# Patient Record
Sex: Female | Born: 1950 | Race: Black or African American | Hispanic: No | Smoking: Former smoker
Health system: Southern US, Community
[De-identification: ages and names within clinical notes are randomized; demographics above are authoritative.]

## PROBLEM LIST (undated history)

## (undated) DIAGNOSIS — E785 Hyperlipidemia, unspecified: Secondary | ICD-10-CM

## (undated) DIAGNOSIS — I2510 Atherosclerotic heart disease of native coronary artery without angina pectoris: Secondary | ICD-10-CM

## (undated) DIAGNOSIS — Z9289 Personal history of other medical treatment: Secondary | ICD-10-CM

## (undated) DIAGNOSIS — I739 Peripheral vascular disease, unspecified: Secondary | ICD-10-CM

## (undated) DIAGNOSIS — R112 Nausea with vomiting, unspecified: Secondary | ICD-10-CM

## (undated) DIAGNOSIS — D649 Anemia, unspecified: Secondary | ICD-10-CM

## (undated) DIAGNOSIS — Z9889 Other specified postprocedural states: Secondary | ICD-10-CM

## (undated) DIAGNOSIS — N189 Chronic kidney disease, unspecified: Secondary | ICD-10-CM

## (undated) DIAGNOSIS — R0600 Dyspnea, unspecified: Secondary | ICD-10-CM

## (undated) DIAGNOSIS — T8859XA Other complications of anesthesia, initial encounter: Secondary | ICD-10-CM

## (undated) DIAGNOSIS — N186 End stage renal disease: Secondary | ICD-10-CM

## (undated) DIAGNOSIS — I10 Essential (primary) hypertension: Secondary | ICD-10-CM

## (undated) DIAGNOSIS — K5900 Constipation, unspecified: Secondary | ICD-10-CM

## (undated) DIAGNOSIS — I219 Acute myocardial infarction, unspecified: Secondary | ICD-10-CM

## (undated) DIAGNOSIS — T4145XA Adverse effect of unspecified anesthetic, initial encounter: Secondary | ICD-10-CM

## (undated) DIAGNOSIS — I509 Heart failure, unspecified: Secondary | ICD-10-CM

## (undated) DIAGNOSIS — I255 Ischemic cardiomyopathy: Secondary | ICD-10-CM

## (undated) HISTORY — PX: EYE SURGERY: SHX253

## (undated) HISTORY — DX: Chronic kidney disease, unspecified: N18.9

## (undated) HISTORY — DX: Peripheral vascular disease, unspecified: I73.9

## (undated) HISTORY — PX: APPENDECTOMY: SHX54

## (undated) HISTORY — PX: CARDIAC CATHETERIZATION: SHX172

## (undated) HISTORY — PX: TONSILLECTOMY: SUR1361

## (undated) HISTORY — PX: TUBAL LIGATION: SHX77

---

## 1999-02-21 ENCOUNTER — Emergency Department (HOSPITAL_COMMUNITY): Admission: EM | Admit: 1999-02-21 | Discharge: 1999-02-21 | Payer: Self-pay | Admitting: Emergency Medicine

## 2000-02-01 ENCOUNTER — Emergency Department (HOSPITAL_COMMUNITY): Admission: EM | Admit: 2000-02-01 | Discharge: 2000-02-02 | Payer: Self-pay | Admitting: Emergency Medicine

## 2000-04-13 ENCOUNTER — Encounter: Admission: RE | Admit: 2000-04-13 | Discharge: 2000-07-12 | Payer: Self-pay | Admitting: Family Medicine

## 2000-07-22 ENCOUNTER — Emergency Department (HOSPITAL_COMMUNITY): Admission: EM | Admit: 2000-07-22 | Discharge: 2000-07-22 | Payer: Self-pay

## 2001-06-10 ENCOUNTER — Emergency Department (HOSPITAL_COMMUNITY): Admission: EM | Admit: 2001-06-10 | Discharge: 2001-06-10 | Payer: Self-pay | Admitting: Emergency Medicine

## 2001-06-10 ENCOUNTER — Encounter: Payer: Self-pay | Admitting: Emergency Medicine

## 2002-06-20 ENCOUNTER — Encounter: Payer: Self-pay | Admitting: Family Medicine

## 2002-06-20 ENCOUNTER — Ambulatory Visit (HOSPITAL_COMMUNITY): Admission: RE | Admit: 2002-06-20 | Discharge: 2002-06-20 | Payer: Self-pay | Admitting: Family Medicine

## 2002-09-05 ENCOUNTER — Emergency Department (HOSPITAL_COMMUNITY): Admission: EM | Admit: 2002-09-05 | Discharge: 2002-09-05 | Payer: Self-pay | Admitting: Emergency Medicine

## 2002-09-05 ENCOUNTER — Encounter: Payer: Self-pay | Admitting: Emergency Medicine

## 2004-06-02 ENCOUNTER — Emergency Department (HOSPITAL_COMMUNITY): Admission: EM | Admit: 2004-06-02 | Discharge: 2004-06-03 | Payer: Self-pay | Admitting: Emergency Medicine

## 2004-06-27 ENCOUNTER — Ambulatory Visit: Payer: Self-pay | Admitting: Family Medicine

## 2004-08-07 ENCOUNTER — Ambulatory Visit: Payer: Self-pay | Admitting: Family Medicine

## 2004-09-10 ENCOUNTER — Ambulatory Visit: Payer: Self-pay | Admitting: Family Medicine

## 2004-09-25 ENCOUNTER — Ambulatory Visit (HOSPITAL_COMMUNITY): Admission: RE | Admit: 2004-09-25 | Discharge: 2004-09-25 | Payer: Self-pay | Admitting: Family Medicine

## 2004-10-08 ENCOUNTER — Ambulatory Visit: Payer: Self-pay | Admitting: Family Medicine

## 2004-10-09 ENCOUNTER — Ambulatory Visit: Payer: Self-pay | Admitting: *Deleted

## 2005-06-18 ENCOUNTER — Ambulatory Visit (HOSPITAL_COMMUNITY): Admission: RE | Admit: 2005-06-18 | Discharge: 2005-06-18 | Payer: Self-pay | Admitting: Family Medicine

## 2005-06-23 ENCOUNTER — Ambulatory Visit: Payer: Self-pay | Admitting: Family Medicine

## 2005-10-22 ENCOUNTER — Ambulatory Visit: Payer: Self-pay | Admitting: Family Medicine

## 2005-11-07 ENCOUNTER — Ambulatory Visit: Payer: Self-pay | Admitting: Family Medicine

## 2005-11-26 ENCOUNTER — Ambulatory Visit: Payer: Self-pay | Admitting: Family Medicine

## 2006-04-28 ENCOUNTER — Ambulatory Visit: Payer: Self-pay | Admitting: Family Medicine

## 2006-04-30 ENCOUNTER — Ambulatory Visit: Payer: Self-pay | Admitting: Family Medicine

## 2006-05-18 ENCOUNTER — Ambulatory Visit: Payer: Self-pay | Admitting: Family Medicine

## 2006-11-04 ENCOUNTER — Ambulatory Visit: Payer: Self-pay | Admitting: Family Medicine

## 2006-11-16 ENCOUNTER — Ambulatory Visit: Payer: Self-pay | Admitting: Family Medicine

## 2006-12-07 ENCOUNTER — Ambulatory Visit: Payer: Self-pay | Admitting: Family Medicine

## 2007-03-02 ENCOUNTER — Ambulatory Visit: Payer: Self-pay | Admitting: Family Medicine

## 2007-05-17 ENCOUNTER — Ambulatory Visit: Payer: Self-pay | Admitting: Family Medicine

## 2007-05-21 ENCOUNTER — Ambulatory Visit: Payer: Self-pay | Admitting: Family Medicine

## 2007-05-21 LAB — CONVERTED CEMR LAB
ALT: 12 units/L (ref 0–35)
AST: 11 units/L (ref 0–37)
Albumin: 3.9 g/dL (ref 3.5–5.2)
Alkaline Phosphatase: 66 units/L (ref 39–117)
BUN: 8 mg/dL (ref 6–23)
CO2: 25 meq/L (ref 19–32)
Calcium: 9.1 mg/dL (ref 8.4–10.5)
Chloride: 103 meq/L (ref 96–112)
Cholesterol: 149 mg/dL (ref 0–200)
Creatinine, Ser: 0.58 mg/dL (ref 0.40–1.20)
Glucose, Bld: 122 mg/dL — ABNORMAL HIGH (ref 70–99)
HDL: 48 mg/dL (ref >39)
Hgb A1c MFr Bld: 11.4 % — ABNORMAL HIGH (ref 4.6–6.1)
LDL Cholesterol: 82 mg/dL (ref 0–99)
Potassium: 4.2 meq/L (ref 3.5–5.3)
Sodium: 142 meq/L (ref 135–145)
Total Bilirubin: 0.4 mg/dL (ref 0.3–1.2)
Total CHOL/HDL Ratio: 3.1
Total Protein: 6.9 g/dL (ref 6.0–8.3)
Triglycerides: 93 mg/dL (ref <150)
VLDL: 19 mg/dL (ref 0–40)

## 2007-06-03 DIAGNOSIS — I10 Essential (primary) hypertension: Secondary | ICD-10-CM | POA: Insufficient documentation

## 2007-06-03 DIAGNOSIS — E785 Hyperlipidemia, unspecified: Secondary | ICD-10-CM | POA: Insufficient documentation

## 2007-06-03 DIAGNOSIS — E109 Type 1 diabetes mellitus without complications: Secondary | ICD-10-CM | POA: Insufficient documentation

## 2007-07-12 ENCOUNTER — Telehealth (INDEPENDENT_AMBULATORY_CARE_PROVIDER_SITE_OTHER): Payer: Self-pay | Admitting: *Deleted

## 2007-07-14 ENCOUNTER — Encounter (INDEPENDENT_AMBULATORY_CARE_PROVIDER_SITE_OTHER): Payer: Self-pay | Admitting: *Deleted

## 2007-07-19 ENCOUNTER — Ambulatory Visit: Payer: Self-pay | Admitting: Family Medicine

## 2007-07-19 DIAGNOSIS — M6530 Trigger finger, unspecified finger: Secondary | ICD-10-CM | POA: Insufficient documentation

## 2007-07-19 DIAGNOSIS — E1140 Type 2 diabetes mellitus with diabetic neuropathy, unspecified: Secondary | ICD-10-CM | POA: Insufficient documentation

## 2007-07-19 LAB — CONVERTED CEMR LAB
Blood Glucose, Fingerstick: 204
Hgb A1c MFr Bld: 10 %

## 2007-09-28 ENCOUNTER — Ambulatory Visit: Payer: Self-pay | Admitting: Family Medicine

## 2007-09-28 LAB — CONVERTED CEMR LAB: Blood Glucose, Fingerstick: 273

## 2007-11-02 ENCOUNTER — Ambulatory Visit (HOSPITAL_COMMUNITY): Admission: RE | Admit: 2007-11-02 | Discharge: 2007-11-02 | Payer: Self-pay | Admitting: Family Medicine

## 2008-03-28 ENCOUNTER — Ambulatory Visit: Payer: Self-pay | Admitting: Family Medicine

## 2008-03-28 LAB — CONVERTED CEMR LAB
Blood Glucose, Fingerstick: 381
Hgb A1c MFr Bld: 14 %

## 2008-04-04 ENCOUNTER — Ambulatory Visit: Payer: Self-pay | Admitting: Family Medicine

## 2008-05-09 ENCOUNTER — Ambulatory Visit: Payer: Self-pay | Admitting: Family Medicine

## 2008-05-09 LAB — CONVERTED CEMR LAB: Blood Glucose, Fingerstick: 125

## 2008-05-10 ENCOUNTER — Encounter (INDEPENDENT_AMBULATORY_CARE_PROVIDER_SITE_OTHER): Payer: Self-pay | Admitting: Family Medicine

## 2008-05-11 ENCOUNTER — Encounter (INDEPENDENT_AMBULATORY_CARE_PROVIDER_SITE_OTHER): Payer: Self-pay | Admitting: Family Medicine

## 2008-05-12 ENCOUNTER — Telehealth (INDEPENDENT_AMBULATORY_CARE_PROVIDER_SITE_OTHER): Payer: Self-pay | Admitting: Family Medicine

## 2008-05-14 LAB — CONVERTED CEMR LAB
ALT: 13 units/L (ref 0–35)
AST: 11 units/L (ref 0–37)
Albumin: 4 g/dL (ref 3.5–5.2)
Alkaline Phosphatase: 54 units/L (ref 39–117)
BUN: 17 mg/dL (ref 6–23)
Basophils Absolute: 0 10*3/uL (ref 0.0–0.1)
Basophils Relative: 0 % (ref 0–1)
CO2: 25 meq/L (ref 19–32)
Calcium: 9.6 mg/dL (ref 8.4–10.5)
Chloride: 103 meq/L (ref 96–112)
Cholesterol: 195 mg/dL (ref 0–200)
Creatinine, Ser: 0.75 mg/dL (ref 0.40–1.20)
Eosinophils Absolute: 0.3 10*3/uL (ref 0.0–0.7)
Eosinophils Relative: 4 % (ref 0–5)
Glucose, Bld: 115 mg/dL — ABNORMAL HIGH (ref 70–99)
HCT: 38.5 % (ref 36.0–46.0)
HDL: 58 mg/dL (ref >39)
Hemoglobin: 12 g/dL (ref 12.0–15.0)
LDL Cholesterol: 117 mg/dL — ABNORMAL HIGH (ref 0–99)
Lymphocytes Relative: 51 % — ABNORMAL HIGH (ref 12–46)
Lymphs Abs: 3.9 10*3/uL (ref 0.7–4.0)
MCHC: 31.2 g/dL (ref 30.0–36.0)
MCV: 88.3 fL (ref 78.0–100.0)
Monocytes Absolute: 0.4 10*3/uL (ref 0.1–1.0)
Monocytes Relative: 5 % (ref 3–12)
Neutro Abs: 3.1 10*3/uL (ref 1.7–7.7)
Neutrophils Relative %: 40 % — ABNORMAL LOW (ref 43–77)
Platelets: 317 10*3/uL (ref 150–400)
Potassium: 4.6 meq/L (ref 3.5–5.3)
RBC: 4.36 M/uL (ref 3.87–5.11)
RDW: 15.1 % (ref 11.5–15.5)
Sodium: 138 meq/L (ref 135–145)
TSH: 2.153 microintl units/mL (ref 0.350–4.50)
Total Bilirubin: 0.3 mg/dL (ref 0.3–1.2)
Total CHOL/HDL Ratio: 3.4
Total Protein: 7.3 g/dL (ref 6.0–8.3)
Triglycerides: 101 mg/dL (ref <150)
VLDL: 20 mg/dL (ref 0–40)
WBC: 7.6 10*3/uL (ref 4.0–10.5)

## 2008-05-26 ENCOUNTER — Encounter (INDEPENDENT_AMBULATORY_CARE_PROVIDER_SITE_OTHER): Payer: Self-pay | Admitting: Family Medicine

## 2008-06-30 ENCOUNTER — Encounter (INDEPENDENT_AMBULATORY_CARE_PROVIDER_SITE_OTHER): Payer: Self-pay | Admitting: Family Medicine

## 2008-11-28 ENCOUNTER — Ambulatory Visit: Payer: Self-pay | Admitting: Family Medicine

## 2008-11-28 DIAGNOSIS — M25569 Pain in unspecified knee: Secondary | ICD-10-CM | POA: Insufficient documentation

## 2008-11-28 LAB — CONVERTED CEMR LAB: Blood Glucose, Fingerstick: 267

## 2008-11-29 ENCOUNTER — Encounter (INDEPENDENT_AMBULATORY_CARE_PROVIDER_SITE_OTHER): Payer: Self-pay | Admitting: Family Medicine

## 2008-11-30 LAB — CONVERTED CEMR LAB
ALT: 17 units/L (ref 0–35)
AST: 15 units/L (ref 0–37)
Albumin: 4.5 g/dL (ref 3.5–5.2)
Alkaline Phosphatase: 76 units/L (ref 39–117)
BUN: 11 mg/dL (ref 6–23)
CO2: 24 meq/L (ref 19–32)
Calcium: 9.8 mg/dL (ref 8.4–10.5)
Chloride: 102 meq/L (ref 96–112)
Cholesterol: 165 mg/dL (ref 0–200)
Creatinine, Ser: 0.74 mg/dL (ref 0.40–1.20)
Glucose, Bld: 230 mg/dL — ABNORMAL HIGH (ref 70–99)
HDL: 58 mg/dL (ref >39)
LDL Cholesterol: 82 mg/dL (ref 0–99)
Microalb, Ur: 10.3 mg/dL — ABNORMAL HIGH (ref 0.00–1.89)
Potassium: 4.2 meq/L (ref 3.5–5.3)
Sodium: 142 meq/L (ref 135–145)
Total Bilirubin: 0.3 mg/dL (ref 0.3–1.2)
Total CHOL/HDL Ratio: 2.8
Total Protein: 8.1 g/dL (ref 6.0–8.3)
Triglycerides: 126 mg/dL (ref <150)
VLDL: 25 mg/dL (ref 0–40)

## 2008-12-01 ENCOUNTER — Ambulatory Visit (HOSPITAL_COMMUNITY): Admission: RE | Admit: 2008-12-01 | Discharge: 2008-12-01 | Payer: Self-pay | Admitting: Family Medicine

## 2008-12-17 ENCOUNTER — Telehealth (INDEPENDENT_AMBULATORY_CARE_PROVIDER_SITE_OTHER): Payer: Self-pay | Admitting: Family Medicine

## 2008-12-20 ENCOUNTER — Ambulatory Visit: Payer: Self-pay | Admitting: Family Medicine

## 2008-12-20 DIAGNOSIS — K5900 Constipation, unspecified: Secondary | ICD-10-CM | POA: Insufficient documentation

## 2008-12-20 LAB — CONVERTED CEMR LAB
Blood Glucose, Fingerstick: 268
Hgb A1c MFr Bld: 12.5 %

## 2008-12-21 ENCOUNTER — Emergency Department (HOSPITAL_COMMUNITY): Admission: EM | Admit: 2008-12-21 | Discharge: 2008-12-22 | Payer: Self-pay | Admitting: Emergency Medicine

## 2009-01-31 ENCOUNTER — Ambulatory Visit: Payer: Self-pay | Admitting: Family Medicine

## 2009-01-31 LAB — CONVERTED CEMR LAB: Blood Glucose, Fingerstick: 191

## 2009-02-05 ENCOUNTER — Ambulatory Visit: Payer: Self-pay | Admitting: Internal Medicine

## 2009-02-14 DIAGNOSIS — E11311 Type 2 diabetes mellitus with unspecified diabetic retinopathy with macular edema: Secondary | ICD-10-CM | POA: Insufficient documentation

## 2009-02-20 ENCOUNTER — Telehealth (INDEPENDENT_AMBULATORY_CARE_PROVIDER_SITE_OTHER): Payer: Self-pay | Admitting: Family Medicine

## 2009-02-28 ENCOUNTER — Ambulatory Visit: Payer: Self-pay | Admitting: Family Medicine

## 2009-02-28 DIAGNOSIS — E113299 Type 2 diabetes mellitus with mild nonproliferative diabetic retinopathy without macular edema, unspecified eye: Secondary | ICD-10-CM | POA: Insufficient documentation

## 2009-02-28 DIAGNOSIS — E11319 Type 2 diabetes mellitus with unspecified diabetic retinopathy without macular edema: Secondary | ICD-10-CM | POA: Insufficient documentation

## 2009-02-28 LAB — CONVERTED CEMR LAB
Blood Glucose, AC Bkfst: 176 mg/dL
Hgb A1c MFr Bld: 10 %

## 2009-03-14 ENCOUNTER — Ambulatory Visit: Payer: Self-pay | Admitting: Family Medicine

## 2009-03-14 LAB — CONVERTED CEMR LAB
Blood Glucose, Fingerstick: 174
Hgb A1c MFr Bld: 9.3 %

## 2009-03-28 ENCOUNTER — Ambulatory Visit: Payer: Self-pay | Admitting: Nurse Practitioner

## 2009-03-28 DIAGNOSIS — H1045 Other chronic allergic conjunctivitis: Secondary | ICD-10-CM | POA: Insufficient documentation

## 2009-03-28 LAB — CONVERTED CEMR LAB: Blood Glucose, Fingerstick: 210

## 2009-04-12 ENCOUNTER — Encounter (INDEPENDENT_AMBULATORY_CARE_PROVIDER_SITE_OTHER): Payer: Self-pay | Admitting: Nurse Practitioner

## 2009-04-19 ENCOUNTER — Encounter (INDEPENDENT_AMBULATORY_CARE_PROVIDER_SITE_OTHER): Payer: Self-pay | Admitting: Nurse Practitioner

## 2009-05-31 ENCOUNTER — Encounter (INDEPENDENT_AMBULATORY_CARE_PROVIDER_SITE_OTHER): Payer: Self-pay | Admitting: Family Medicine

## 2009-12-02 ENCOUNTER — Emergency Department (HOSPITAL_COMMUNITY): Admission: EM | Admit: 2009-12-02 | Discharge: 2009-12-02 | Payer: Self-pay | Admitting: Emergency Medicine

## 2009-12-19 ENCOUNTER — Encounter: Payer: Self-pay | Admitting: Physician Assistant

## 2010-06-14 ENCOUNTER — Encounter: Payer: Self-pay | Admitting: Physician Assistant

## 2010-11-28 NOTE — Assessment & Plan Note (Signed)
Summary: PT NEEDS TO SEE DR Radene Ou IN 2 WKS FOR UNCONTROLLED DM//GK   Vital Signs:  Patient profile:   60 year old female Height:      63 inches Weight:      212 pounds Temp:     97.1 degrees F oral Pulse rate:   76 / minute Pulse rhythm:   regular Resp:     18 per minute BP sitting:   152 / 90  (left arm) Cuff size:   regular  Vitals Entered By: [place] Shannon (Mar 14, 2009 11:36 AM)  Primary Care Provider:  Willey Due   History of Present Illness: Here for f/u on uncontrolled diabetes and recent insulin adjustment. Pt reports compliance with meds and can correctly say how she is doing her insulin.   AM fasting CBGs 65-150,lunch 62-150,pre-supper 68-103,qhs 53-104. Several values under 60.  Pt frustrated as she is no longer eligible to stay at [facility] as she is a [place] resident BUT the [facility] is no longer taking new patients and she would be wait-listed.  Allergies: No Known Drug Allergies  Past History:  Past Medical History:    Current Problems:     HYPERTENSION (ICD-401.9)    HYPERLIPIDEMIA (ICD-272.4)    IDDM (ICD-250.01)     (06/03/2007)  Past Surgical History:    Appendectomy    Tubal ligation     (07/19/2007)  Family History:    Mother living DM,HTN,High cholesterol    Father deceased MVA (2009-02-08)  Social History:    Occupation:part-time housecleaning    Single    Alcohol use-no    Drug use-no    Tobacco: smoked <10 years over 10 years ago.     (02/08/09)  Physical Exam  General:  Well-developed,well-nourished,in no acute distress; alert,appropriate and cooperative throughout examination. No other exam...here for insulin adjustment.   Impression & Recommendations:  Problem # 1:  IDDM (ICD-250.01) Uncontrolled DM with recent addition of qac Novolog SSI. HgA1c was 10 % 02/28/2009; today it is 9.3%. She is getting low CBGs so her qac SSI Novolog is adjusted to lower doses per CBG. She needs to eat small  meals three times a day and 2 snack all containing protein. If BS's still showing in 70's then she is to decrease her Levemir to 60 units. But, hopefully the Novolog sliding scale will take care of problem.  Her updated medication list for this problem includes:    Glimepiride 4 Mg Tabs (Glimepiride) .Marland Kitchen... Take 2 pills  by mouth    every morning    Levemir Flexpen 100 Unit/ml Soln (Insulin detemir) ..... Inject 65 units subcutaneously each day    Benazepril-hydrochlorothiazide 20-12.5 Mg Tabs (Benazepril-hydrochlorothiazide) .Marland Kitchen... Take 2 tablets by mouth  every morning    Actos 45 Mg Tabs (Pioglitazone hcl) .Marland Kitchen... 1 by mouth daily    Metformin Hcl 500 Mg Tabs (Metformin hcl) .Marland Kitchen... Take  2 pills am and pm    Novolog Flexpen 100 Unit/ml Soln (Insulin aspart) .Marland Kitchen... Check bs and if <80 then eat and recheck in 1 hour.if bs 81-120 then give 5 units,121-150 8 units,151-200,10 units, over 200 then give 12 units.  Problem # 2:  Medical Follow-up Discussion occurred between patient, MD, and office manager(Regina Poe) regarding need for continuity of care and issue with pt not being eligible for [facility](as resident of [place]) and yet unable to get into the [facility]. Ms.Poe will be working on a solution to facilitate continuity of care in this diabetic  pt.  Complete Medication List: 1)  Crestor 20 Mg Tabs (Rosuvastatin calcium) .... Take 1 tablet by mouth once a day 2)  Glimepiride 4 Mg Tabs (Glimepiride) .... Take 2 pills  by mouth    every morning 3)  Levemir Flexpen 100 Unit/ml Soln (Insulin detemir) .... Inject 65 units subcutaneously each day 4)  Benazepril-hydrochlorothiazide 20-12.5 Mg Tabs (Benazepril-hydrochlorothiazide) .... Take 2 tablets by mouth  every morning 5)  Actos 45 Mg Tabs (Pioglitazone hcl) .Marland Kitchen.. 1 by mouth daily 6)  Miralax Powd (Polyethylene glycol 3350) .Marland Kitchen.. 17g by mouth once daily as needed constipation 7)  Amitriptyline Hcl 25 Mg Tabs (Amitriptyline  hcl) .... Take 1 tab by mouth at bedtime for feet/sleep 8)  Metformin Hcl 500 Mg Tabs (Metformin hcl) .... Take  2 pills am and pm 9)  Novolog Flexpen 100 Unit/ml Soln (Insulin aspart) .... Check bs and if <80 then eat and recheck in 1 hour.if bs 81-120 then give 5 units,121-150 8 units,151-200,10 units, over 200 then give 12 units.  Patient Instructions: 1)  If BS still under 70's occasionally then decrease Levemir to 60 units once daily. 2)  See MD in 2 weeks for DM.  Laboratory Results   Blood Tests   Date/Time Received: Mar 14, 2009 12:07 PM   HGBA1C: 9.3%   (Normal Range: Non-Diabetic - 3-6%   Control Diabetic - 6-8%) CBG Random:: 174mg /dL

## 2010-11-28 NOTE — Assessment & Plan Note (Signed)
Summary: SEE DR Radene Ou IN 6 WKS FOR DM//GK   Vital Signs:  Patient profile:   60 year old female Height:      63 inches Weight:      208 pounds BMI:     36.98 Temp:     96.5 degrees F oral Pulse rate:   72 / minute Pulse rhythm:   regular Resp:     18 per minute BP sitting:   110 / 72  (left arm) Cuff size:   large  Vitals Entered By: [place] Shannon (January 31, 2009 9:46 AM)  History of Present Illness: Here for f/u DM No BS diary...says still runs in high 100s-200s.She did not bump levemir to 65. Following her diabetic diet. Weight up 6 lbs since 7/09. MD discussed that she may eventually need qac SSI but asked if she had ever been on Metformin previously and if so,did she have any issues with it, Pt unable to recall but does not think she has ever been told that she could not take any specific type of diabetes medication.  Says she wants something to help with sleep and feet at night.Was on desipramine in 12/06 per paper chart.  Habits & Providers     Drug Use: no  Current Medications (verified): 1)  Crestor 20 Mg  Tabs (Rosuvastatin Calcium) .... Take 1 Tablet By Mouth Once A Day 2)  Glimepiride 4 Mg  Tabs (Glimepiride) .... Take 2 Pills  By Mouth    Every Morning 3)  Levemir Flexpen 100 Unit/ml  Soln (Insulin Detemir) .... Inject 60 Units Subcutaneously Each Day 4)  Benazepril-Hydrochlorothiazide 20-12.5 Mg  Tabs (Benazepril-Hydrochlorothiazide) .... Take 2 Tablets By Mouth  Every Morning 5)  Actos 45 Mg Tabs (Pioglitazone Hcl) .Marland Kitchen.. 1 By Mouth Daily 6)  Miralax   Powd (Polyethylene Glycol 3350) .Marland Kitchen.. 17g By Mouth Once Daily As Needed Constipation  Allergies (verified): No Known Drug Allergies  Past History:  Past Medical History:    Reviewed history from 06/03/2007 and no changes required:    Current Problems:     HYPERTENSION (ICD-401.9)    HYPERLIPIDEMIA (ICD-272.4)    IDDM (ICD-250.01)  Past Surgical History:    Reviewed history from 07/19/2007 and no changes  required:    Appendectomy    Tubal ligation  Family History:    Mother living DM,HTN,High cholesterol    Father deceased MVA  Social History:    Occupation:part-time housecleaning    Single    Alcohol use-no    Drug use-no    Tobacco: smoked <10 years over 10 years ago.    Occupation:  employed  Physical Exam  General:  Well-developed,well-nourished,in no acute distress; alert,appropriate and cooperative throughout examination Lungs:  Normal respiratory effort, chest expands symmetrically. Lungs are clear to auscultation, no crackles or wheezes. Heart:  Normal rate and regular rhythm. S1 and S2 normal without gallop, murmur, click, rub or other extra sounds. Extremities:  no lesions. No CCE.   Impression & Recommendations:  Problem # 1:  IDDM (ICD-250.01) Needs tighter control. Increase Levemir to 65 units once daily. Review of paper chart shows that patient has tolerated Metformin in past and will re-initiate...at 500mg  two times a day x 1 week then 2 tabs two times a day. Keep CBG record and bring for MD review in 4 weeks. Schedule retasure. Had Td and pneumovax 2007.  Her updated medication list for this problem includes:    Glimepiride 4 Mg Tabs (Glimepiride) .Marland Kitchen... Take 2 pills  by  mouth    every morning    Levemir Flexpen 100 Unit/ml Soln (Insulin detemir) ..... Inject 65 units subcutaneously each day    Benazepril-hydrochlorothiazide 20-12.5 Mg Tabs (Benazepril-hydrochlorothiazide) .Marland Kitchen... Take 2 tablets by mouth  every morning    Actos 45 Mg Tabs (Pioglitazone hcl) .Marland Kitchen... 1 by mouth daily    Metformin Hcl 500 Mg Tabs (Metformin hcl) .Marland Kitchen... Take one pill am and pm x 1 week then increase to 2 pills am and pm  Problem # 2:  DIABETIC PERIPHERAL NEUROPATHY (ICD-250.60) Trial Amitryptiline 25 mg at bedtime.  Problem # 3:  HYPERTENSION (ICD-401.9) At goal. Her updated medication list for this problem includes:    Benazepril-hydrochlorothiazide 20-12.5 Mg Tabs  (Benazepril-hydrochlorothiazide) .Marland Kitchen... Take 2 tablets by mouth  every morning  Complete Medication List: 1)  Crestor 20 Mg Tabs (Rosuvastatin calcium) .... Take 1 tablet by mouth once a day 2)  Glimepiride 4 Mg Tabs (Glimepiride) .... Take 2 pills  by mouth    every morning 3)  Levemir Flexpen 100 Unit/ml Soln (Insulin detemir) .... Inject 65 units subcutaneously each day 4)  Benazepril-hydrochlorothiazide 20-12.5 Mg Tabs (Benazepril-hydrochlorothiazide) .... Take 2 tablets by mouth  every morning 5)  Actos 45 Mg Tabs (Pioglitazone hcl) .Marland Kitchen.. 1 by mouth daily 6)  Miralax Powd (Polyethylene glycol 3350) .Marland Kitchen.. 17g by mouth once daily as needed constipation 7)  Amitriptyline Hcl 25 Mg Tabs (Amitriptyline hcl) .... Take 1 tab by mouth at bedtime for feet/sleep 8)  Metformin Hcl 500 Mg Tabs (Metformin hcl) .... Take one pill am and pm x 1 week then increase to 2 pills am and pm  Patient Instructions: 1)  Increase Levemir to 65 units each day. 2)  Start Metformin 3)  See MD in 4 weeks for DM 4)  schedule Retasure.  Prescriptions: METFORMIN HCL 500 MG TABS (METFORMIN HCL) Take one pill AM and PM x 1 week then increase to 2 pills AM and PM  #120 x 2   Entered and Authorized by:   Milagros Evener MD   Signed by:   Milagros Evener MD on 01/31/2009   Method used:   Print then Give to Patient   RxID:   [id number] AMITRIPTYLINE HCL 25 MG TABS (AMITRIPTYLINE HCL) Take 1 tab by mouth at bedtime for feet/sleep  #30 x 1   Entered and Authorized by:   Milagros Evener MD   Signed by:   Milagros Evener MD on 01/31/2009   Method used:   Print then Give to Patient   RxID:   [id number] LEVEMIR FLEXPEN 100 UNIT/ML  SOLN (INSULIN DETEMIR) Inject 65 units Subcutaneously each day  #1 x 2   Entered and Authorized by:   Milagros Evener MD   Signed by:   Milagros Evener MD on 01/31/2009   Method used:   Print then Give to Patient   RxID:   [id number]       Laboratory Results   Blood  Tests   Date/Time Received: January 31, 2009 9:49 AM   CBG Random:: 191mg /dL

## 2010-11-28 NOTE — Letter (Signed)
Summary: MAILED REQUESTED RECORDS TO [facility]  MAILED REQUESTED RECORDS TO [facility] OFFICE   Imported By: Roland Earl 06/14/2010 10:31:49  _____________________________________________________________________  External Attachment:    Type:   Image     Comment:   External Document

## 2010-11-28 NOTE — Assessment & Plan Note (Signed)
Summary: F/U DM  /NS   Vital Signs:  Patient Profile:   60 Years Old Female Height:     63 inches Weight:      192 pounds BMI:     34.13 BSA:     1.90 Temp:     98 degrees F oral Pulse rate:   58 / minute Pulse rhythm:   regular Resp:     18 per minute BP sitting:   142 / 88  (left arm) Cuff size:   regular  Pt. in pain?   yes    Location:   feet/knees    Intensity:   7    Type:       aching  Vitals Entered By: Bridgett Larsson RN (March 28, 2008 10:04 AM)              Is Patient Diabetic? Yes  Nutritional Status Detail non-fasting CBG Result 381 CBG Device ID A  Does patient need assistance? Functional Status Self care Ambulation Normal     Serial Vital Signs/Assessments:  Time      Position  BP       Pulse  Resp  Temp     By 11:07 AM            150/86                         Milagros Evener MD    PCP:  Ensley Blas  Chief Complaint:  F/U DM.  History of Present Illness: Here for f/u on uncontrolled IDDM. T/C to [facility] pharmacy,pt had meds including levemir filled on 03/21/08.Prior to that 01/04/08 refilled. Pt's eligibility ran out and she didn't get meds.Only been back on meds only one week after being off for 1 month. Prior to running out of meds, her levemir was up to 54 units. Pt currently caring for her grandchildren while her daughter is caring for mother-in-law dying from cancer.    Prior Medications Reviewed Using: Medication Bottles  Updated Prior Medication List: CRESTOR 20 MG  TABS (ROSUVASTATIN CALCIUM) Take 1 tablet by mouth once a day GLIMEPIRIDE 4 MG  TABS (GLIMEPIRIDE) Take 2 pills  by mouth    every morning * LEVEMIR FLEXPEN 50  units  Subcutaneously  every morning ACTOS 30 MG TABS (PIOGLITAZONE HCL) Take 1 tablet by mouth every morning for diabetes BENAZEPRIL-HYDROCHLOROTHIAZIDE 20-25 MG  TABS (BENAZEPRIL-HYDROCHLOROTHIAZIDE) Take one pill by mouth  every morning For blood pressure  Current Allergies: No known allergies   Past Medical  History:    Reviewed history from 06/03/2007 and no changes required:       Current Problems:        HYPERTENSION (ICD-401.9)       HYPERLIPIDEMIA (ICD-272.4)       IDDM (ICD-250.01)         Past Surgical History:    Reviewed history from 07/19/2007 and no changes required:       Appendectomy       Tubal ligation      Physical Exam  General:     Well-developed,well-nourished,in no acute distress; alert,appropriate and cooperative throughout examination Psych:     Cognition and judgment appear intact. Alert and cooperative with normal attention span and concentration. No apparent delusions, illusions, hallucinations  Diabetes Management Exam:    Foot Exam (with socks and/or shoes not present):       Sensory-Monofilament:          Left foot: abnormal  Right foot: abnormal    Impression & Recommendations:  Problem # 1:  IDDM (ICD-250.01) Uncontrolled with HgA1c=14%(worse from 9% in Fall 2008). Pt off meds with poor f/u because of family obligations. Increase actos to max dose 45mg   every morning. Increase levemir to 60 units once daily. MD d/w patient that if BS not improved in 6 weeks then will need to add Lispro SSI qac. Her updated medication list for this problem includes:    Glimepiride 4 Mg Tabs (Glimepiride) .Marland Kitchen... Take 2 pills  by mouth    every morning    Levemir Flexpen 100 Unit/ml Soln (Insulin detemir) ..... Inject 60 units subcutaneously each day    Actos 45 Mg Tabs (Pioglitazone hcl) .Marland Kitchen... Take 1 tablet by mouth every morning    Benazepril-hydrochlorothiazide 20-12.5 Mg Tabs (Benazepril-hydrochlorothiazide) .Marland Kitchen... Take 2 tablets by mouth  every morning   Problem # 2:  HYPERTENSION (ICD-401.9) Not at goal. Change BP med to Benazepril/HCTZ 20/12.5mg  take 2 tabs by mouth  every morning. Her updated medication list for this problem includes:    Benazepril-hydrochlorothiazide 20-12.5 Mg Tabs (Benazepril-hydrochlorothiazide) .Marland Kitchen... Take 2 tablets by mouth   every morning   Problem # 3:  HYPERLIPIDEMIA (ICD-272.4) No labs done at this visit as pt has eaten today. Her updated medication list for this problem includes:    Crestor 20 Mg Tabs (Rosuvastatin calcium) .Marland Kitchen... Take 1 tablet by mouth once a day   Complete Medication List: 1)  Crestor 20 Mg Tabs (Rosuvastatin calcium) .... Take 1 tablet by mouth once a day 2)  Glimepiride 4 Mg Tabs (Glimepiride) .... Take 2 pills  by mouth    every morning 3)  Levemir Flexpen 100 Unit/ml Soln (Insulin detemir) .... Inject 60 units subcutaneously each day 4)  Actos 45 Mg Tabs (Pioglitazone hcl) .... Take 1 tablet by mouth every morning 5)  Benazepril-hydrochlorothiazide 20-12.5 Mg Tabs (Benazepril-hydrochlorothiazide) .... Take 2 tablets by mouth  every morning  Other Orders: Capillary Blood Glucose ([id number]) Fingerstick (36416) Hgb A1C [id number])   Patient Instructions: 1)  Please schedule a follow-up appointment in 6 weeks with Dr. Bartolo Darter for DM/HTN/fasting labs. 2)  Come fasting to next appointment.   Prescriptions: GLIMEPIRIDE 4 MG  TABS (GLIMEPIRIDE) Take 2 pills  by mouth    every morning  #60 x 5   Entered and Authorized by:   Milagros Evener MD   Signed by:   Milagros Evener MD on 03/28/2008   Method used:   Print then Give to Patient   RxID:   [id number] CRESTOR 20 MG  TABS (ROSUVASTATIN CALCIUM) Take 1 tablet by mouth once a day  #30 x 5   Entered and Authorized by:   Milagros Evener MD   Signed by:   Milagros Evener MD on 03/28/2008   Method used:   Print then Give to Patient   RxID:   [id number] BENAZEPRIL-HYDROCHLOROTHIAZIDE 20-12.5 MG  TABS (BENAZEPRIL-HYDROCHLOROTHIAZIDE) Take 2 tablets by mouth  every morning  #60 x 4   Entered and Authorized by:   Milagros Evener MD   Signed by:   Milagros Evener MD on 03/28/2008   Method used:   Print then Give to Patient   RxID:   [id number] LEVEMIR FLEXPEN 100 UNIT/ML  SOLN (INSULIN DETEMIR) Inject 60 units  Subcutaneously each day  #1 month x 4   Entered and Authorized by:   Milagros Evener MD   Signed by:   Milagros Evener MD on 03/28/2008   Method used:   Print then  Give to Patient   RxID:   [id number] ACTOS 45 MG  TABS (PIOGLITAZONE HCL) Take 1 tablet by mouth every morning  #30 x 4   Entered and Authorized by:   Milagros Evener MD   Signed by:   Milagros Evener MD on 03/28/2008   Method used:   Print then Give to Patient   RxID:   [id number]  ]  Last LDL:                                                 82 (05/21/2007 9:27:00 PM)        Diabetic Foot Exam Foot Inspection Is there a history of a foot ulcer?              No Is there a foot ulcer now?              No Can the patient see the bottom of their feet?          No Are the shoes appropriate in style and fit?          No Is there swelling or an abnormal foot shape?          No Are the toenails long?                No Are the toenails thick?                No Are the toenails ingrown?              No Is there heavy callous build-up?              No Is there a claw toe deformity?                          No Is there elevated skin temperature?            No Is there limited ankle dorsiflexion?            No Is there foot or ankle muscle weakness?            No Do you have pain in calf while walking?           No       Set Next Diabetic Foot Exam here: 09/27/2008   10-g (5.07) Semmes-Weinstein Monofilament Test Performed by: Bridgett Larsson RN          Right Foot          Left Foot Visual Inspection               Test Control      normal         normal Site 1         abnormal         abnormal Site 2         normal         normal Site 3         normal         normal Site 4         normal         normal Site 5         normal         normal Site 6  normal         normal Site 7         normal         normal Site 8         normal         normal Site 9         normal         normal Site 10          normal         normal  Impression      abnormal         abnormal    Laboratory Results   Blood Tests   Date/Time Received: March 28, 2008 10:42 AM  Date/Time Reported: March 28, 2008 10:42 AM   HGBA1C: 14.0%   (Normal Range: Non-Diabetic - 3-6%   Control Diabetic - 6-8%) CBG Random:: [id number]

## 2010-11-28 NOTE — Assessment & Plan Note (Signed)
Summary: SEE MD IN 4 WEEKS FOR DM//GK   Vital Signs:  Patient profile:   60 year old female Height:      63 inches Weight:      214 pounds BMI:     38.05 Temp:     97.2 degrees F oral Pulse rate:   76 / minute Pulse rhythm:   regular Resp:     18 per minute Cuff size:   regular  Vitals Entered By: [place] Shannon (Feb 28, 2009 8:59 AM) CC: F/u.....Melissa Howe pt says she will not be coming to [facility] much longer since she stays in [place] and she told me that she would have to sschedule the appt. when she is able to come up with the money. Is Patient Diabetic? Yes  Pain Assessment Patient in pain? no       Does patient need assistance? Ambulation Normal   History of Present Illness: Here for DM f/u. Having issues with eligibility as now resident of [place].Was able to get some meds through [facility] but not her levemir insulin or crestor.Is about to run out of levemir.  Current Medications (verified): 1)  Crestor 20 Mg  Tabs (Rosuvastatin Calcium) .... Take 1 Tablet By Mouth Once A Day 2)  Glimepiride 4 Mg  Tabs (Glimepiride) .... Take 2 Pills  By Mouth    Every Morning 3)  Levemir Flexpen 100 Unit/ml  Soln (Insulin Detemir) .... Inject 65 Units Subcutaneously Each Day 4)  Benazepril-Hydrochlorothiazide 20-12.5 Mg  Tabs (Benazepril-Hydrochlorothiazide) .... Take 2 Tablets By Mouth  Every Morning 5)  Actos 45 Mg Tabs (Pioglitazone Hcl) .Melissa Howe.. 1 By Mouth Daily 6)  Miralax   Powd (Polyethylene Glycol 3350) .Melissa Howe.. 17g By Mouth Once Daily As Needed Constipation 7)  Amitriptyline Hcl 25 Mg Tabs (Amitriptyline Hcl) .... Take 1 Tab By Mouth At Bedtime For Feet/sleep 8)  Metformin Hcl 500 Mg Tabs (Metformin Hcl) .... Take One Pill Am and Pm X 1 Week Then Increase To 2 Pills Am and Pm  Allergies (verified): No Known Drug Allergies  Past History:  Past Medical History:    Reviewed history from 06/03/2007 and no changes required:    Current Problems:     HYPERTENSION (ICD-401.9)   HYPERLIPIDEMIA (ICD-272.4)    IDDM (ICD-250.01)  Past Surgical History:    Reviewed history from 07/19/2007 and no changes required:    Appendectomy    Tubal ligation  Family History:    Reviewed history from 01/31/2009 and no changes required:       Mother living DM,HTN,High cholesterol       Father deceased MVA  Social History:    Reviewed history from 01/31/2009 and no changes required:       Occupation:part-time housecleaning       Single       Alcohol use-no       Drug use-no       Tobacco: smoked <10 years over 10 years ago.  Physical Exam  General:  Well-developed,well-nourished,in no acute distress; alert,appropriate and cooperative throughout examination Lungs:  Normal respiratory effort, chest expands symmetrically. Lungs are clear to auscultation, no crackles or wheezes. Heart:  Normal rate and regular rhythm. S1 and S2 normal without gallop, murmur, click, rub or other extra sounds.   Impression & Recommendations:  Problem # 1:  IDDM (ICD-250.01) Poor control but improved slightly at 10 % from 11/2008 when HgA1 was 12.5%. MD discussed with Pt that BS are still running too high and she is already  maxed on oral meds and on a high dose of levemir. Next step is the addition of sliding scale qac Novolog insulin. Pt is tolerating the metformin at 500mg  2 tabs two times a day. Sample pens of both Levemir and Novolog insulin are given to pt.  Her updated medication list for this problem includes:    Glimepiride 4 Mg Tabs (Glimepiride) .Melissa Howe... Take 2 pills  by mouth    every morning    Levemir Flexpen 100 Unit/ml Soln (Insulin detemir) ..... Inject 65 units subcutaneously each day    Benazepril-hydrochlorothiazide 20-12.5 Mg Tabs (Benazepril-hydrochlorothiazide) .Melissa Howe... Take 2 tablets by mouth  every morning    Actos 45 Mg Tabs (Pioglitazone hcl) .Melissa Howe... 1 by mouth daily    Metformin Hcl 500 Mg Tabs (Metformin hcl) .Melissa Howe... Take  2 pills am and pm    Novolog Flexpen 100 Unit/ml  Soln (Insulin aspart) .Melissa Howe... 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 Pt commits to appt. Orders: Ophthalmology Referral (Ophthalmology)  Problem # 3:  HYPERLIPIDEMIA (P102836.4) Samples of crestor given to pt.  Her updated medication list for this problem includes:    Crestor 20 Mg Tabs (Rosuvastatin calcium) .Melissa Howe... Take 1 tablet by mouth once a day  Complete Medication List: 1)  Crestor 20 Mg Tabs (Rosuvastatin calcium) .... Take 1 tablet by mouth once a day 2)  Glimepiride 4 Mg Tabs (Glimepiride) .... Take 2 pills  by mouth    every morning 3)  Levemir Flexpen 100 Unit/ml Soln (Insulin detemir) .... Inject 65 units subcutaneously each day 4)  Benazepril-hydrochlorothiazide 20-12.5 Mg Tabs (Benazepril-hydrochlorothiazide) .... Take 2 tablets by mouth  every morning 5)  Actos 45 Mg Tabs (Pioglitazone hcl) .Melissa Howe.. 1 by mouth daily 6)  Miralax Powd (Polyethylene glycol 3350) .Melissa Howe.. 17g by mouth once daily as needed constipation 7)  Amitriptyline Hcl 25 Mg Tabs (Amitriptyline hcl) .... Take 1 tab by mouth at bedtime for feet/sleep 8)  Metformin Hcl 500 Mg Tabs (Metformin hcl) .... Take  2 pills am and pm 9)  Novolog Flexpen 100 Unit/ml Soln (Insulin aspart) .... Check bs and if <80 then eat and recheck in 1 hour.if bs 81-120 then give 10 units,121-150 12 units,151-200,15 units, over 200 then give 20 units  Patient Instructions: 1)  Pt needs to see Dr.Nasreen Goedecke in 2 weeks for uncontrolled diabetes. 2)  See Dr.Brewington ASAP(for eyes). Prescriptions: NOVOLOG FLEXPEN 100 UNIT/ML SOLN (INSULIN ASPART) Check BS and if <80 then eat and recheck in 1 hour.IF BS 81-120 then give 10 units,121-150  12 units,151-200,15 units, over 200 then give 20 units  #2 x 0   Entered and Authorized by:   Milagros Evener MD   Signed by:   Milagros Evener MD on 02/28/2009   Method used:   Samples Given   RxID:   [id number] LEVEMIR FLEXPEN 100 UNIT/ML  SOLN (INSULIN DETEMIR) Inject 65 units Subcutaneously each day  #2 x 0   Entered and Authorized by:   Milagros Evener MD   Signed by:   Milagros Evener MD on 02/28/2009   Method used:   Samples Given   RxID:   [id number] CRESTOR 20 MG  TABS (ROSUVASTATIN CALCIUM) Take 1 tablet by mouth once a day  #  84 x 0   Entered and Authorized by:   Milagros Evener MD   Signed by:   Milagros Evener MD on 02/28/2009   Method used:   Samples Given   RxID:   [id number] NOVOLOG FLEXPEN 100 UNIT/ML SOLN (INSULIN ASPART) Check BS and if <80 then eat and recheck in 1 hour.IF BS 81-120 then give 10 units,121-150 12 units,151-200,15 units, over 200 then give 20 units  #1 month x 4   Entered and Authorized by:   Milagros Evener MD   Signed by:   Milagros Evener MD on 02/28/2009   Method used:   Print then Give to Patient   RxID:   [id number]     Laboratory Results   Blood Tests   Date/Time Received: Feb 28, 2009 9:03 AM   HGBA1C: 10.0%   (Normal Range: Non-Diabetic - 3-6%   Control Diabetic - 6-8%) CBG Fasting:: 176mg /dL       Last LDL:                                                 82 (11/28/2008 9:32:00 PM)      Diabetic Foot Exam Last Podiatry Exam Date: 02/28/2009  Foot Inspection Is there a history of a foot ulcer?              No Is there a foot ulcer now?              No Can the patient see the bottom of their feet?          Yes Are the shoes appropriate in style and fit?          Yes Is there swelling or an abnormal foot shape?          No Are the toenails long?                No Are the toenails thick?                No Are the toenails ingrown?              No Is there heavy callous build-up?               No Is there a claw toe deformity?                          No Is there elevated skin temperature?            No Is there limited ankle dorsiflexion?            No Is there foot or ankle muscle weakness?            No Do you have pain in calf while walking?           No

## 2010-11-28 NOTE — Assessment & Plan Note (Signed)
Summary: 20 pt/ follow up in 2 weeks dm//gk   Vital Signs:  Patient profile:   60 year old female Height:      63 inches Weight:      213 pounds BMI:     37.87 Temp:     97.3 degrees F oral Pulse rate:   72 / minute Pulse rhythm:   regular Resp:     18 per minute BP sitting:   130 / 90  (left arm) Cuff size:   regular  Vitals Entered By: [place] Shannon (March 28, 2009 11:17 AM)  Primary Care Provider:  Wonder Donaway  CC:  FlU..... this is pt last visit here...Marland KitchenMarland Kitchen pt would like samples of levemir.....Marland Kitchen  History of Present Illness: Here for 1 week f/u on DM. Had some low BSs last week and SSI Novolog was decreased. She was also to decrease Levemir to 60 units if still had some CBGs <70. Has 1 week of eye tearing and some burning x 1 week.Has tried OTC visine.  Pt reports her fasting CBGs of 60-80 and rest of day 100-120.  Pt ate eggs, sausage,biscuit for breakfast.  Current Medications (verified): 1)  Crestor 20 Mg  Tabs (Rosuvastatin Calcium) .... Take 1 Tablet By Mouth Once A Day 2)  Glimepiride 4 Mg  Tabs (Glimepiride) .... Take 2 Pills  By Mouth    Every Morning 3)  Levemir Flexpen 100 Unit/ml  Soln (Insulin Detemir) .... Inject 65 Units Subcutaneously Each Day 4)  Benazepril-Hydrochlorothiazide 20-12.5 Mg  Tabs (Benazepril-Hydrochlorothiazide) .... Take 2 Tablets By Mouth  Every Morning 5)  Actos 45 Mg Tabs (Pioglitazone Hcl) .Marland Kitchen.. 1 By Mouth Daily 6)  Miralax   Powd (Polyethylene Glycol 3350) .Marland Kitchen.. 17g By Mouth Once Daily As Needed Constipation 7)  Amitriptyline Hcl 25 Mg Tabs (Amitriptyline Hcl) .... Take 1 Tab By Mouth At Bedtime For Feet/sleep 8)  Metformin Hcl 500 Mg Tabs (Metformin Hcl) .... Take  2 Pills Am and Pm 9)  Novolog Flexpen 100 Unit/ml Soln (Insulin Aspart) .... Check Bs and If <80 Then Eat and Recheck in 1 Hour.if Bs 81-120 Then Give 5 Units,121-150 8 Units,151-200,10 Units, Over 200 Then Give 12 Units.  Allergies (verified): No Known Drug Allergies  Past  History:  Past Medical History: Reviewed history from 06/03/2007 and no changes required. Current Problems:  HYPERTENSION (ICD-401.9) HYPERLIPIDEMIA (ICD-272.4) IDDM (ICD-250.01)  Past Surgical History: Reviewed history from 07/19/2007 and no changes required. Appendectomy Tubal ligation  Review of Systems       One morning when CBG in 60's, she felt weak,shaky, and sweaty.  Physical Exam  General:  Well-developed,well-nourished,in no acute distress; alert,appropriate and cooperative throughout examination. Eyes:  No injection. Has some clear tearing. No matting.  Diabetes Management Exam:    Foot Exam (with socks and/or shoes not present):       Sensory-Monofilament:          Left foot: normal          Right foot: normal   Impression & Recommendations:  Problem # 1:  IDDM (ICD-250.01)  Still getting some fasting CBGs in 60's with some tremulousness,jitteriness,sweaty. Will decrease Levemir to 55 units once daily and eat a protein snack at bedtime.  Her updated medication list for this problem includes:    Glimepiride 4 Mg Tabs (Glimepiride) .Marland Kitchen... Take 2 pills  by mouth    every morning    Levemir Flexpen 100 Unit/ml Soln (Insulin detemir) ..... Inject 65 units subcutaneously each day  Benazepril-hydrochlorothiazide 20-12.5 Mg Tabs (Benazepril-hydrochlorothiazide) .Marland Kitchen... Take 2 tablets by mouth  every morning    Actos 45 Mg Tabs (Pioglitazone hcl) .Marland Kitchen... 1 by mouth daily    Metformin Hcl 500 Mg Tabs (Metformin hcl) .Marland Kitchen... Take  2 pills am and pm    Novolog Flexpen 100 Unit/ml Soln (Insulin aspart) .Marland Kitchen... Check bs and if <80 then eat and recheck in 1 hour.if bs 81-120 then give 5 units,121-150 8 units,151-200,10 units, over 200 then give 12 units.  Orders: Capillary Blood Glucose/CBG [id number])  Problem # 2:  HYPERTENSION (ICD-401.9)  Her updated medication list for this problem includes:    Benazepril-hydrochlorothiazide 20-12.5 Mg Tabs (Benazepril-hydrochlorothiazide)  .Marland Kitchen... 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  Complete Medication List: 1)  Crestor 20 Mg Tabs (Rosuvastatin calcium) .... Take 1 tablet by mouth once a day 2)  Glimepiride 4 Mg Tabs (Glimepiride) .... Take 2 pills  by mouth    every morning 3)  Levemir Flexpen 100 Unit/ml Soln (Insulin detemir) .... Inject 65 units subcutaneously each day 4)  Benazepril-hydrochlorothiazide 20-12.5 Mg Tabs (Benazepril-hydrochlorothiazide) .... Take 2 tablets by mouth  every morning 5)  Actos 45 Mg Tabs (Pioglitazone hcl) .Marland Kitchen.. 1 by mouth daily 6)  Miralax Powd (Polyethylene glycol 3350) .Marland Kitchen.. 17g by mouth once daily as needed constipation 7)  Amitriptyline Hcl 25 Mg Tabs (Amitriptyline hcl) .... Take 1 tab by mouth at bedtime for feet/sleep 8)  Metformin Hcl 500 Mg Tabs (Metformin hcl) .... Take  2 pills am and pm 9)  Novolog Flexpen 100 Unit/ml Soln (Insulin aspart) .... Check bs and if <80 then eat and recheck in 1 hour.if bs 81-120 then give 5 units,121-150 8 units,151-200,10 units, over 200 then give 12 units.  Patient Instructions: 1)  Pt to see a provider here in 1 month for recheck on her diabetes. Prescriptions: LEVEMIR FLEXPEN 100 UNIT/ML  SOLN (INSULIN DETEMIR) Inject 65 units Subcutaneously each day  #3 x 0   Entered and Authorized by:   Milagros Evener MD   Signed by:   Milagros Evener MD on 03/28/2009   Method used:   Samples Given   RxID:   [id number]   Laboratory Results   Blood Tests   Date/Time Received: March 28, 2009 11:22 AM   CBG Random:: 210mg /dL      Last LDL:                                                 82 (11/28/2008 9:32:00 PM)        Diabetic Foot Exam Last Podiatry Exam Date: 02/28/2009    10-g (5.07) Semmes-Weinstein Monofilament Test Performed by: [place] Shannon          Right Foot          Left Foot Visual  Inspection               Test Control      normal         normal Site 1         normal         normal Site 2         normal         normal Site  3         normal         normal Site 4         normal         normal Site 5         normal         normal Site 6         normal         normal Site 7         normal         normal Site 8         normal         normal Site 9         normal         normal Site 10         normal         normal  Impression      normal         normal

## 2010-11-28 NOTE — Assessment & Plan Note (Signed)
Summary: 2 MONTH FU TO [place] DM////KT  Medications Added * LEVEMIR FLEXPEN 50  units  Subcutaneously  every morning      Allergies Added: NKDA  Vital Signs:  Patient Profile:   60 Years Old Female Height:     63 inches Weight:      192 pounds BMI:     34.13 BSA:     1.90 Temp:     98 degrees F oral Pulse rate:   80 / minute Pulse rhythm:   regular Resp:     20 per minute BP sitting:   130 / 86  (right arm) Cuff size:   regular  Pt. in pain?   no  Vitals Entered By: Bridgett Larsson RN (September 28, 2007 11:52 AM)              Is Patient Diabetic? Yes  Nutritional Status Detail non-fasting CBG Result 273 CBG Device ID B  Does patient need assistance? Ambulation Normal Comments did not bring meds     PCP:  Afifa Truax  Chief Complaint:  F/U DM.  History of Present Illness: Reports BS in 200's for last few weeks. Pt's mother is in hospital. Pt reports has missed several days of  levemir shot.  Current Allergies: No known allergies   Past Medical History:    Reviewed history from 06/03/2007 and no changes required:       Current Problems:        HYPERTENSION (ICD-401.9)       HYPERLIPIDEMIA (ICD-272.4)       IDDM (ICD-250.01)         Past Surgical History:    Reviewed history from 07/19/2007 and no changes required:       Appendectomy       Tubal ligation      Physical Exam  General:     Well-developed,well-nourished,in no acute distress; alert,appropriate and cooperative throughout examination Lungs:     Normal respiratory effort, chest expands symmetrically. Lungs are clear to auscultation, no crackles or wheezes. Heart:     Normal rate and regular rhythm. S1 and S2 normal without gallop, murmur, click, rub or other extra sounds.    Impression & Recommendations:  Problem # 1:  IDDM (ICD-250.01) Must comply with daily levemir at 50 units once daily x 1week. Then increase levemir by 2 units every week until fasting BS between 80-120. FLU shot  given Schedule annual mammogram Her updated medication list for this problem includes:    Glimepiride 4 Mg Tabs (Glimepiride) .Marland Kitchen... Take 2 pills  by mouth    every morning    Benazepril-hydrochlorothiazide 20-25 Mg Tabs (Benazepril-hydrochlorothiazide) .Marland Kitchen... Take one pill by mouth  every morning for blood pressure   Problem # 2:  HYPERTENSION (ICD-401.9)  Her updated medication list for this problem includes:    Benazepril-hydrochlorothiazide 20-25 Mg Tabs (Benazepril-hydrochlorothiazide) .Marland Kitchen... Take one pill by mouth  every morning for blood pressure   Problem # 3:  HYPERLIPIDEMIA (ICD-272.4)  Her updated medication list for this problem includes:    Crestor 20 Mg Tabs (Rosuvastatin calcium) .Marland Kitchen... Take 1 tablet by mouth once a day   Complete Medication List: 1)  Crestor 20 Mg Tabs (Rosuvastatin calcium) .... Take 1 tablet by mouth once a day 2)  Glimepiride 4 Mg Tabs (Glimepiride) .... Take 2 pills  by mouth    every morning 3)  Levemir Flexpen  .Marland Kitchen.. 50  units  subcutaneously  every morning 4)  Actos 30mg   .... 1tablet by mouth  each morning 5)  Amitriptyline Hcl 25 Mg Tabs (Amitriptyline hcl) .... Take one pill at bedtime for feet 6)  Benazepril-hydrochlorothiazide 20-25 Mg Tabs (Benazepril-hydrochlorothiazide) .... Take one pill by mouth  every morning for blood pressure  Other Orders: Capillary Blood Glucose [id number]) Fingerstick [id number]) Flu Vaccine 60yrs + [id number]) Admin 1st Vaccine [id number]) Radiology Referral (Radiology)   Patient Instructions: 1)  Please schedule a follow-up appointment in 2 months. 2)  May increase levemir by 2 units every week until BS in AM and PM are between 80-120. F/U with MD if BS drop consistently below 80.    Prescriptions: LEVEMIR FLEXPEN 50  units  Subcutaneously  every morning  #1 month x 3   Entered and Authorized by:   Milagros Evener MD   Signed by:   Milagros Evener MD on 09/28/2007   Method used:   Print then Give to Patient   RxID:    [id number]  ]  Influenza Vaccine    Vaccine Type: Fluvax 3+    Site: right deltoid    Mfr: [place]    Dose: 0.5 ml    Route: IM    Given by: Bridgett Larsson RN    Exp. Date: 04/25/2008    Lot #: [id number]    VIS given: 05/20/07 version given September 28, 2007.  Flu Vaccine Consent Questions    Do you have a history of severe allergic reactions to this vaccine? no    Any prior history of allergic reactions to egg and/or gelatin? no    Do you have a sensitivity to the preservative Thimersol? no    Do you have a past history of Guillan-Barre Syndrome? no    Do you currently have an acute febrile illness? no    Have you ever had a severe reaction to latex? no    Vaccine information given and explained to patient? yes    Are you currently pregnant? no

## 2010-11-28 NOTE — Assessment & Plan Note (Signed)
Summary: 6 WEEK FU FOR DM/HTN/FASTING LABS//KT   Vital Signs:  Patient Profile:   60 Years Old Female Height:     63 inches Weight:      202 pounds BMI:     35.91 BSA:     1.94 Temp:     97.5 degrees F oral Pulse rate:   60 / minute Pulse rhythm:   regular Resp:     18 per minute BP sitting:   140 / 80  (left arm) Cuff size:   regular  Pt. in pain?   yes    Location:   feet    Intensity:   6    Type:       aching  Vitals Entered By: Bridgett Larsson RN (May 09, 2008 9:00 AM)              Is Patient Diabetic? Yes  CBG Result 125 CBG Device ID A  Does patient need assistance? Functional Status Self care Ambulation Normal     PCP:  Grafton Warzecha  Chief Complaint:  F/U DM/HTN; joint pain bil knees.  History of Present Illness: Here for fasting labs and recheck on DM and HTN. See 03/28/2008 OV note for med changes and adjustments. Pt reports compliance in meds and levemir. Has BS in the 100's by her report.      Updated Prior Medication List: CRESTOR 20 MG  TABS (ROSUVASTATIN CALCIUM) Take 1 tablet by mouth once a day GLIMEPIRIDE 4 MG  TABS (GLIMEPIRIDE) Take 2 pills  by mouth    every morning LEVEMIR FLEXPEN 100 UNIT/ML  SOLN (INSULIN DETEMIR) Inject 60 units Subcutaneously each day * ACTOS 30 MG  TABS (PIOGLITAZONE HCL) Take 1 tablet by mouth every morning BENAZEPRIL-HYDROCHLOROTHIAZIDE 20-12.5 MG  TABS (BENAZEPRIL-HYDROCHLOROTHIAZIDE) Take 2 tablets by mouth  every morning  Current Allergies: No known allergies   Past Medical History:    Reviewed history from 06/03/2007 and no changes required:       Current Problems:        HYPERTENSION (ICD-401.9)       HYPERLIPIDEMIA (ICD-272.4)       IDDM (ICD-250.01)         Past Surgical History:    Reviewed history from 07/19/2007 and no changes required:       Appendectomy       Tubal ligation    Risk Factors: Tobacco use:  quit    Year quit:  70's Passive smoke exposure:  no Drug use:  no HIV high-risk  behavior:  no Caffeine use:  3 drinks per day Alcohol use:  no Exercise:  no Seatbelt use:  100 % Sun Exposure:  occasionally  Family History Risk Factors:    Family History of MI in females < 34 years old:  no    Family History of MI in males < 37 years old:  no  Mammogram History:    Date of Last Mammogram:  11/02/2007    Physical Exam  General:     Well-developed,well-nourished,in no acute distress; alert,appropriate and cooperative throughout examination Lungs:     Normal respiratory effort, chest expands symmetrically. Lungs are clear to auscultation, no crackles or wheezes. Heart:     Normal rate and regular rhythm. S1 and S2 normal without gallop, murmur, click, rub or other extra sounds.    Impression & Recommendations:  Problem # 1:  IDDM (ICD-250.01) Improved with Pt's compliance. Her updated medication list for this problem includes:    Glimepiride 4 Mg Tabs (Glimepiride) .Marland KitchenMarland KitchenMarland KitchenMarland Kitchen  Take 2 pills  by mouth    every morning    Levemir Flexpen 100 Unit/ml Soln (Insulin detemir) ..... Inject 60 units subcutaneously each day    Benazepril-hydrochlorothiazide 20-12.5 Mg Tabs (Benazepril-hydrochlorothiazide) .Marland Kitchen... Take 2 tablets by mouth  every morning  Orders: T-Urine Microalbumin w/creat. ratio [id number] / [id number])   Problem # 2:  HYPERTENSION (ICD-401.9)  Her updated medication list for this problem includes:    Benazepril-hydrochlorothiazide 20-12.5 Mg Tabs (Benazepril-hydrochlorothiazide) .Marland Kitchen... Take 2 tablets by mouth  every morning  Orders: T-General Health Panel (CBCD, CMP, TSH) ([id number])   Problem # 3:  HYPERLIPIDEMIA (ICD-272.4)  Her updated medication list for this problem includes:    Crestor 20 Mg Tabs (Rosuvastatin calcium) .Marland Kitchen... Take 1 tablet by mouth once a day  Orders: T-Lipid Profile [id number])   Complete Medication List: 1)  Crestor 20 Mg Tabs (Rosuvastatin calcium) .... Take 1 tablet by mouth once a day 2)  Glimepiride 4 Mg Tabs  (Glimepiride) .... Take 2 pills  by mouth    every morning 3)  Levemir Flexpen 100 Unit/ml Soln (Insulin detemir) .... Inject 60 units subcutaneously each day 4)  Actos 30 Mg Tabs (pioglitazone Hcl)  .... Take 1 tablet by mouth every morning 5)  Benazepril-hydrochlorothiazide 20-12.5 Mg Tabs (Benazepril-hydrochlorothiazide) .... Take 2 tablets by mouth  every morning  Other Orders: Capillary Blood Glucose [id number]) Fingerstick [id number])    ]

## 2010-11-28 NOTE — Letter (Signed)
Summary: NUTRITIONIST SUMMARY  NUTRITIONIST SUMMARY   Imported By: Roland Earl 05/23/2008 09:07:38  _____________________________________________________________________  External Attachment:    Type:   Image     Comment:   External Document

## 2010-11-28 NOTE — Assessment & Plan Note (Signed)
Summary: discuss mri results//gk   Vital Signs:  Patient Profile:   60 Years Old Female Height:     63 inches Weight:      208 pounds Temp:     96.8 degrees F Pulse rate:   80 / minute Pulse rhythm:   regular Resp:     18 per minute BP sitting:   120 / 78  (left arm) Cuff size:   large  Pt. in pain?   no  Vitals Entered By: Shellia Carwin CMA (December 20, 2008 9:02 AM)              Is Patient Diabetic? Yes CBG Result 268  Does patient need assistance? Ambulation Normal     PCP:  Bleu Moisan  Chief Complaint:  f/u on MRI of knee.  History of Present Illness: See recent phone note regarding discussion on knee MRI and chronicity of problem.  #1 Pt has appointment to be seen at [facility] for assistance with ortho referral and anticipated surgery for her knee. Appt is end of March.  #1 Pt's meter not working well.Was a CBG 131 this am and she has not eaten and has taken her medicines. reports compliance with her meds and insulin.  #3 Has had chronic intermittent problems with constipation.Would like something to help.        Prior Medications Reviewed Using: Patient Recall  Current Allergies: No known allergies   Past Medical History:    Reviewed history from 06/03/2007 and no changes required:       Current Problems:        HYPERTENSION (ICD-401.9)       HYPERLIPIDEMIA (ICD-272.4)       IDDM (ICD-250.01)         Past Surgical History:    Reviewed history from 07/19/2007 and no changes required:       Appendectomy       Tubal ligation      Physical Exam  General:     Well-developed,well-nourished,in no acute distress; alert,appropriate and cooperative throughout examination Lungs:     Normal respiratory effort, chest expands symmetrically. Lungs are clear to auscultation, no crackles or wheezes. Heart:     Normal rate and regular rhythm. S1 and S2 normal without gallop, murmur, click, rub or other extra sounds. Msk:     Left knee:  mild,non-erythematous effusion anteriomedial.No warmth.Expected bony changes and elargement seen with OA    Impression & Recommendations:  Problem # 1:  KNEE PAIN, LEFT, CHRONIC (ICD-719.46) T0 f/u with [facility] to see if can obtain assistance for Ortho referral and probable surgery. OTC NSAIDS as needed.  Problem # 2:  IDDM (ICD-250.01) Poor control with HgA1c=12.5%. Increase actos to 45 mg once daily. If no improvement in diabetic control at max actos then may need to consider qac Lispro in addition to her usual Levemir. Her updated medication list for this problem includes:    Glimepiride 4 Mg Tabs (Glimepiride) .Marland Kitchen... Take 2 pills  by mouth    every morning    Levemir Flexpen 100 Unit/ml Soln (Insulin detemir) ..... Inject 60 units subcutaneously each day    Benazepril-hydrochlorothiazide 20-12.5 Mg Tabs (Benazepril-hydrochlorothiazide) .Marland Kitchen... Take 2 tablets by mouth  every morning    Actos 45 Mg Tabs (Pioglitazone hcl) .Marland Kitchen... 1 by mouth daily   Problem # 3:  CONSTIPATION (ICD-564.00) Dietary changes d/w patient including increased fiber in diet and increased fruits/vegs/water...  Her updated medication list for this problem includes:    Miralax Powd (  Polyethylene glycol 3350) .Marland KitchenMarland KitchenMarland KitchenMarland Kitchen 17g by mouth once daily as needed constipation   Complete Medication List: 1)  Crestor 20 Mg Tabs (Rosuvastatin calcium) .... Take 1 tablet by mouth once a day 2)  Glimepiride 4 Mg Tabs (Glimepiride) .... Take 2 pills  by mouth    every morning 3)  Levemir Flexpen 100 Unit/ml Soln (Insulin detemir) .... Inject 60 units subcutaneously each day 4)  Benazepril-hydrochlorothiazide 20-12.5 Mg Tabs (Benazepril-hydrochlorothiazide) .... Take 2 tablets by mouth  every morning 5)  Actos 45 Mg Tabs (Pioglitazone hcl) .Marland Kitchen.. 1 by mouth daily 6)  Miralax Powd (Polyethylene glycol 3350) .Marland Kitchen.. 17g by mouth once daily as needed constipation  Other Orders: Capillary Blood Glucose [id number]) Fingerstick  [id number])   Patient Instructions: 1)  Increase Actos to 45 mg each day. 2)  Use miralax powder once daily. 3)  Drink 8 8oz glasses of water each day. 4)  Eat high fi ber cereal like Kashi or Fiber-one 5)  Eat fresh fruits and vegetables/dried fruits.Marland KitchenMarland Kitchen 6)  SEE DR.Chanice Brenton IN 6 WEEKS FOR DIABETES   Prescriptions: MIRALAX   POWD (POLYETHYLENE GLYCOL 3350) 17g by mouth once daily as needed constipation  #1 month x 2   Entered and Authorized by:   Milagros Evener MD   Signed by:   Milagros Evener MD on 12/20/2008   Method used:   Print then Give to Patient   RxID:   [id number] ACTOS 45 MG TABS (PIOGLITAZONE HCL) 1 by mouth daily  #30 x 3   Entered and Authorized by:   Milagros Evener MD   Signed by:   Milagros Evener MD on 12/20/2008   Method used:   Printed then faxed to ...       [facility] (retail)       [place], [place]  [address]       Ph: [phone] [place]       Fax: [phone]   RxID:   [id number]   Laboratory Results   Blood Tests     HGBA1C: 12.5%   (Normal Range: Non-Diabetic - 3-6%   Control Diabetic - 6-8%) CBG Random:: 268mg /dL

## 2010-11-28 NOTE — Progress Notes (Signed)
Summary: MRI knee report   Phone Note Outgoing Call   Summary of Call: MD spoke to patient and reviewed MRI and blood tests. She has yet to call [facility] and we discussed that she needs to do tomorrow as will eventually need to see orthopedist as this is chronic problem.Her knee is painful mainly climbing up and down stairs. She currently felt constipated and "took something" with good relief but now feels drained...has not checked BS.  MD recommended checking BS to make sure not a factor in how she is feeling.Drink plenty of liquids and keep alreasdy scheduled appt with me on 2/24.. Initial call taken by: Milagros Evener MD,  December 17, 2008 4:54 PM

## 2010-11-28 NOTE — Assessment & Plan Note (Signed)
Summary: 2 month f/u dm per 77 /tmm   Vital Signs:  Patient Profile:   60 Years Old Female Height:     63 inches Weight:      196 pounds BMI:     34.85 BSA:     1.92 Temp:     97.7 degrees F oral Pulse rate:   80 / minute Pulse rhythm:   regular Resp:     20 per minute BP sitting:   150 / 90  (left arm) Cuff size:   regular  Pt. in pain?   yes    Location:   feet and hands    Intensity:   7    Type:       tingling  Vitals Entered By: Bridgett Larsson RN (July 19, 2007 3:07 PM)              Is Patient Diabetic? No CBG Result 204 CBG Device ID B  Does patient need assistance? Ambulation Normal Comments pt. did not bring meds     PCP:  Hillman Attig  Chief Complaint:  F/u dm.  History of Present Illness: Here for f/u .on diabetes. Reports BS in 100's. No polydipsia or polyurea. Reports good energy level. #1 c/o left thumb sticking,occ painful. Present x 1week. No h/o injury. #2 c/o bilateral numbness,mainly at toes of feet. Also,notes bilateral hand pain after changing lightbulbs repetitively approx. 1 week ago. Massage helped. Still throb at night.  Current Allergies (reviewed today): No known allergies   Past Surgical History:    Reviewed history from 06/03/2007 and no changes required:       Appendectomy       Tubal ligation    Risk Factors:  Tobacco use:  quit    Year quit:  70's Passive smoke exposure:  no Drug use:  no HIV high-risk behavior:  no Caffeine use:  3 drinks per day Alcohol use:  no Exercise:  no Seatbelt use:  100 % Sun Exposure:  occasionally  Family History Risk Factors:    Family History of MI in females < 38 years old:  no    Family History of MI in males < 26 years old:  no   Review of Systems       no polyurea.no polydipsia   Physical Exam  General:     Well-developed,well-nourished,in no acute distress; alert,appropriate and cooperative throughout examination Lungs:     Normal respiratory effort, chest expands  symmetrically. Lungs are clear to auscultation, no crackles or wheezes. Heart:     Normal rate and regular rhythm. S1 and S2 normal without gallop, murmur, click, rub or other extra sounds. Extremities:     No clubbing, cyanosis, edema, or deformity noted with normal full range of motion of all joints. Foot exam without skin breakdown,lesions. Normal dorsalis pedis pulses to palpation. Monofilament exam significant for several areas on  both feet with sensation loss.   Additional Exam:     Left Hand: thumb with intermittent catching of flexor tendon with movement.C/w trigger thumb.  Diabetes Management Exam:    Foot Exam (with socks and/or shoes not present):       Sensory-Monofilament:          Left foot: abnormal          Right foot: abnormal    Impression & Recommendations:  Problem # 1:  IDDM (ICD-250.01) Increase actos to 30mg   every morning Check CBG's and record. Check values post-prandial to try and identify when sugars are running  high. Pt reports her BS numbers are in the 100's but her HgA1c remains elevated at 10. MD d/w patient that goal is <6. The following medications were removed from the medication list:    Benazepril Hcl 20 Mg Tabs (Benazepril hcl) .Marland Kitchen... Take 1 tab every morning by mouth  Her updated medication list for this problem includes:    Glimepiride 4 Mg Tabs (Glimepiride) .Marland Kitchen... Take 2 pills  by mouth    every morning    Benazepril-hydrochlorothiazide 20-25 Mg Tabs (Benazepril-hydrochlorothiazide) .Marland Kitchen... Take one pill by mouth  every morning for blood pressure Continue Levemir 45 units qday  Orders: Hemoglobin A1C (83036)   Problem # 2:  DIABETIC PERIPHERAL NEUROPATHY (ICD-250.60) Start amitryptyline 25mg  Take 1 tablet by mouth at bedtime MD d/w pt need for tight diabetic control so as to slow progression of symptoms. MD d/w pt need to check her feet two times a day.    Problem # 3:  TRIGGER FINGER, LEFT THUMB (ICD-727.03) Etiology d/w pt.  Reassurance given. If symptoms worsen then may need specialty referral for tendon injection.  Problem # 4:  HYPERTENSION (ICD-401.9)  The following medications were removed from the medication list:    Benazepril Hcl 20 Mg Tabs (Benazepril hcl) .Marland Kitchen... Take 1 tab every morning by mouth  Her updated medication list for this problem includes:    Benazepril-hydrochlorothiazide 20-25 Mg Tabs (Benazepril-hydrochlorothiazide) .Marland Kitchen... Take one pill by mouth  every morning for blood pressure   Complete Medication List: 1)  Crestor 20 Mg Tabs (Rosuvastatin calcium) .... Take 1 tablet by mouth once a day 2)  Glimepiride 4 Mg Tabs (Glimepiride) .... Take 2 pills  by mouth    every morning 3)  Levemir Flexpen  .Marland Kitchen.. 45 units  subcutaneously  every morning 4)  Actos 30mg   .... 1tablet by mouth each morning 5)  Amitriptyline Hcl 25 Mg Tabs (Amitriptyline hcl) .... Take one pill at bedtime for feet 6)  Benazepril-hydrochlorothiazide 20-25 Mg Tabs (Benazepril-hydrochlorothiazide) .... Take one pill by mouth  every morning for blood pressure  Other Orders: Capillary Blood Glucose ([id number]) Fingerstick [id number])   Patient Instructions: 1)  Please schedule a follow-up appointment in 2 months. 2)  Check your blood sugars regularly. If your readings are usually above 350 : or below 70 you should contact our office. 3)  It is important that your Diabetic A1c level is checked every 3 months. 4)  See your eye doctor yearly to check for diabetic eye damage. 5)  Check your feet each night for sore areas, calluses or signs of infection. 6)  Check your Blood Pressure regularly. If it is above 150/100: you should make an appointment.    Prescriptions: ACTOS 30MG  1tablet by mouth each morning  #30 x 2   Entered and Authorized by:   Milagros Evener MD   Signed by:   Milagros Evener MD on 07/19/2007   Method used:   Print then Give to Patient   RxID:   [id number] BENAZEPRIL-HYDROCHLOROTHIAZIDE 20-25 MG  TABS  (BENAZEPRIL-HYDROCHLOROTHIAZIDE) Take one pill by mouth  every morning For blood pressure  #30 x 2   Entered and Authorized by:   Milagros Evener MD   Signed by:   Milagros Evener MD on 07/19/2007   Method used:   Print then Give to Patient   RxID:   [id number] AMITRIPTYLINE HCL 25 MG  TABS (AMITRIPTYLINE HCL) Take one pill at bedtime for feet  #30 x 1   Entered and Authorized by:   [place]  Shekelia Boutin MD   Signed by:   Milagros Evener MD on 07/19/2007   Method used:   Print then Give to Patient   RxID:   [id number]  ] Laboratory Results   Blood Tests   Date/Time Received: July 19, 2007 3:17 PM  Date/Time Reported: July 19, 2007 3:17 PM   HGBA1C: 10.0%   (Normal Range: Non-Diabetic - 3-6%   Control Diabetic - 6-8%) CBG Random:: 204       Last LDL:                                                 82 (05/21/2007 9:27:00 PM)        Diabetic Foot Exam Foot Inspection Is there a history of a foot ulcer?              No Is there a foot ulcer now?              No Is there swelling or an abnormal foot shape?          No Are the toenails long?                Yes Are the toenails thick?                Yes Are the toenails ingrown?              No Is there heavy callous build-up?              No Is there a claw toe deformity?                          No Is there elevated skin temperature?            No Is there limited ankle dorsiflexion?            No Is there foot or ankle muscle weakness?            No Do you have pain in calf while walking?           Yes         10-g (5.07) Semmes-Weinstein Monofilament Test Performed by: Bridgett Larsson RN          Right Foot          Left Foot Visual Inspection               Test Control      normal         normal Site 1         abnormal         normal Site 2         normal         normal Site 3         normal         normal Site 4         normal         normal Site 5         normal         normal Site 6          normal         normal Site 7         normal  normal Site 8         normal         normal Site 9         abnormal         abnormal Site 10         abnormal         abnormal  Impression      abnormal         abnormal

## 2010-11-28 NOTE — Letter (Signed)
Summary: MAILED REQUESTED RECORDS TO [facility]   MAILED REQUESTED RECORDS TO [facility]   Imported By: Roland Earl 12/19/2009 10:35:29  _____________________________________________________________________  External Attachment:    Type:   Image     Comment:   External Document

## 2010-11-28 NOTE — Letter (Signed)
Summary: requesting medical records for self  requesting medical records for self   Imported By: Roland Earl 05/29/2008 14:15:28  _____________________________________________________________________  External Attachment:    Type:   Image     Comment:   External Document

## 2010-11-28 NOTE — Letter (Signed)
Summary: MAILED REQUESTED RECORDS TO [facility]  MAILED REQUESTED RECORDS TO [facility]   Imported By: Roland Earl 06/30/2008 10:31:32  _____________________________________________________________________  External Attachment:    Type:   Image     Comment:   External Document

## 2010-11-28 NOTE — Miscellaneous (Signed)
Summary: Dx added   Diabetes Management History:      She says that she is not exercising regularly.    Diabetes Management Exam:    Eye Exam:       Eye Exam done elsewhere          Date: 04/12/2009          Results: diabetic retinopathy          Done by: Dr. Ricki Miller  Clinical Lists Changes  Observations: Added new observation of EYES COMMENT: 04/2010 (04/19/2009 8:30) Added new observation of EYE EXAM BY: Dr. Ricki Miller (04/12/2009 8:31) Added new observation of DMEYEEXMRES: diabetic retinopathy (04/12/2009 8:31) Added new observation of DIAB EYE EX: diabetic retinopathy (04/12/2009 8:31)

## 2010-11-28 NOTE — Assessment & Plan Note (Signed)
Summary: to update meds////kt   Vital Signs:  Patient Profile:   60 Years Old Female Height:     63 inches Weight:      209 pounds BMI:     37.16 BSA:     1.97 Temp:     97.5 degrees F oral Pulse rate:   76 / minute Pulse rhythm:   regular Resp:     16 per minute BP sitting:   160 / 94  (left arm) Cuff size:   regular  Pt. in pain?   yes    Location:   feet    Intensity:   7    Type:       aching  Vitals Entered By: [place] Shannon (November 28, 2008 3:44 PM)  Menstrual History: LMP - Character: menopausal              Is Patient Diabetic? Yes Did you bring your meter with you today? No CBG Result 267     PCP:  Samaiya Awadallah  Chief Complaint:  pt says her knee has been brothering her.... she went to ED 9/09 and they told her to do a follow up with Dr. Patrice Paradise but she says he is too expensive.... Is there somewhere she can go that takes [facility] patients... pt need med refill .  History of Present Illness: Has several months of left knee pain. Worse when climbing stairs.No h/o injury or recent trauma. Had [facility] at [facility] "inflammation" and she was given a knee brace. No prior MRI.  Out of meds x several weeks(especially insulin).    Prior Medications Reviewed Using: Patient Recall  Current Allergies (reviewed today): No known allergies   Past Medical History:    Reviewed history from 06/03/2007 and no changes required:       Current Problems:        HYPERTENSION (ICD-401.9)       HYPERLIPIDEMIA (ICD-272.4)       IDDM (ICD-250.01)         Past Surgical History:    Reviewed history from 07/19/2007 and no changes required:       Appendectomy       Tubal ligation      Physical Exam  General:     Well-developed,well-nourished,in no acute distress; alert,appropriate and cooperative throughout examination Msk:     Left knee:no erythema,increased warmth,or effusion.Pt is tender to palpation over joint lines and pain reproducible with full  flexion and extension.Stability appears intact to testing.    Impression & Recommendations:  Problem # 1:  KNEE PAIN, LEFT, CHRONIC (ICD-719.46) OTC  NSAIDS as needed.. See patient instructions Orders: MRI (MRI)   Problem # 2:  IDDM (ICD-250.01) Poor control. Needs to restart meds. Her updated medication list for this problem includes:    Glimepiride 4 Mg Tabs (Glimepiride) .Marland Kitchen... Take 2 pills  by mouth    every morning    Levemir Flexpen 100 Unit/ml Soln (Insulin detemir) ..... Inject 60 units subcutaneously each day    Benazepril-hydrochlorothiazide 20-12.5 Mg Tabs (Benazepril-hydrochlorothiazide) .Marland Kitchen... Take 2 tablets by mouth  every morning  Orders: T-Urine Microalbumin w/creat. ratio [id number] / [id number])   Problem # 3:  HYPERTENSION (ICD-401.9) poor control. Needs to restart meds. Her updated medication list for this problem includes:    Benazepril-hydrochlorothiazide 20-12.5 Mg Tabs (Benazepril-hydrochlorothiazide) .Marland Kitchen... Take 2 tablets by mouth  every morning   Complete Medication List: 1)  Crestor 20 Mg Tabs (Rosuvastatin calcium) .... Take 1 tablet by mouth once a day 2)  Glimepiride 4 Mg Tabs (Glimepiride) .... Take 2 pills  by mouth    every morning 3)  Levemir Flexpen 100 Unit/ml Soln (Insulin detemir) .... Inject 60 units subcutaneously each day 4)  Actos 30 Mg Tabs (pioglitazone Hcl)  .... Take 1 tablet by mouth every morning 5)  Benazepril-hydrochlorothiazide 20-12.5 Mg Tabs (Benazepril-hydrochlorothiazide) .... Take 2 tablets by mouth  every morning  Other Orders: T-Comprehensive Metabolic Panel ([id number]) T-Lipid Profile [id number])   Patient Instructions: 1)  #1 See Dr.Dama Hedgepeth 1 week after your knee MRI 2)  #2 Call [facility](try [phone]) to see if they could help you see an orthopedist. 3)  #3 Get and take all your medications including your insulin. 4)  #4 Check your sugars and write them down for Dr.Johnhenry Tippin.    Laboratory  Results   Blood Tests   Date/Time Received: November 28, 2008 3:46 PM  Date/Time Reported: November 28, 2008 3:46 PM   CBG Random:: 267mg /dL

## 2010-11-28 NOTE — Letter (Signed)
Summary: *[facility] Results Follow up  [place], [place] [address]   Phone: [phone]  Fax: [phone]      05/11/2008   ROQUEL CARNATHAN [address] [place], [place]  [address]   Dear  Ms. Kurtis Bushman,                            ____S.Drinkard,FNP   ____D. Gore,FNP       ____B. McPherson,MD   __x__V. Dalilah Curlin,MD    ____E. Mulberry,MD    ____N. Hassell Done, FNP  ____D. Jobe Igo, MD    ____K. Tomma Lightning, MD    ____Other     This letter is to inform you that your recent test(s):  _______Pap Smear    _____x__Lab Test     _______X-ray    ____x___ is within acceptable limits  _______ requires a medication change  _______ requires a follow-up lab visit  _______ requires a follow-up visit with your provider   Comments:Your labs looked good so medication and levemir is working. Continue current meds and levemir.       _________________________________________________________ If you have any questions, please contact our office                     Sincerely,  Milagros Evener MD [facility]

## 2010-11-28 NOTE — Letter (Signed)
Summary: FAXED REQUESTED RECORDS TO [facility]  FAXED REQUESTED RECORDS TO [facility]   Imported By: Roland Earl 05/31/2009 12:48:41  _____________________________________________________________________  External Attachment:    Type:   Image     Comment:   External Document

## 2010-11-28 NOTE — Consult Note (Signed)
Summary: Consultation Report/DR.T.E BREWINGTON/OPHTHALMOLOGIST  Consultation Report/DR.T.E BREWINGTON/OPHTHALMOLOGIST   Imported By: Roland Earl 04/23/2009 11:57:18  _____________________________________________________________________  External Attachment:    Type:   Image     Comment:   External Document

## 2010-11-28 NOTE — Progress Notes (Signed)
Summary: OFFICE VISIT  Phone Note Call from Patient Call back at Home Phone [phone]   Caller: Patient Summary of Call: THE PT EYES ARE BURNING PER TWO DAYS.  SHE CANNOT SUPPORT THE PAIN. SHE WANTS TO SEE DR Radene Ou ASAP. Initial call taken by: Alexis Goodell,  July 12, 2007 11:20 AM  Follow-up for Phone Call        yesterday eyes burning and watering ok today no vision  changes instructed to apply cool compresses as needed if problem reoccurs need to schedule a visit Follow-up by: Bridgett Larsson RN,  July 13, 2007 12:54 PM

## 2010-11-28 NOTE — Letter (Signed)
Summary: INFLUENZA VACCINATION  INFLUENZA VACCINATION   Imported By: Roland Earl 09/28/2007 13:41:04  _____________________________________________________________________  External Attachment:    Type:   Image     Comment:   External Document

## 2010-11-28 NOTE — Progress Notes (Signed)
Summary: Microalbumin   Phone Note From Other Clinic   Caller: [facility] Summary of Call: Lab did not recieve urine for Microalbumin  on July 14 Initial call taken by: Isla Pence,  May 12, 2008 10:42 AM  Follow-up for Phone Call        Will resend at next f/u appt. Follow-up by: Milagros Evener MD,  May 14, 2008 10:15 PM

## 2010-11-28 NOTE — Letter (Signed)
Summary: TESE ORDER FORM/MRI/APPT DATE & TIME  TESE ORDER FORM/MRI/APPT DATE & TIME   Imported By: Roland Earl 11/29/2008 12:51:54  _____________________________________________________________________  External Attachment:    Type:   Image     Comment:   External Document

## 2010-11-28 NOTE — Letter (Signed)
Summary: REFERRAL OPHTHALMOLOGY/APPT DATE & TIME  REFERRAL OPHTHALMOLOGY/APPT DATE & TIME   Imported By: Roland Earl 02/28/2009 12:42:43  _____________________________________________________________________  External Attachment:    Type:   Image     Comment:   External Document

## 2010-11-28 NOTE — Miscellaneous (Signed)
Summary: VIP  Patient: Melissa Howe Note: All result statuses are Final unless otherwise noted.  Tests: (1) VIP (Medications)   LLIMPORTMEDS              "Result Below..."       RESULT: LEVEMIR FLEXPEN SOLN 100 UNIT/ML*INJECT 45 UNITS UNDER SKIN EACH NIGHT AT BEDTIME*04/07/2007*Last Refill: 06/21/2007*[id number]*******   LLIMPORTMEDS              "Result Below..."       RESULT: CRESTOR TABS 20 MG*TAKE ONE (1) TABLET BY MOUTH EVERY DAY*04/07/2007*Last Refill: 07/27/2007*[id number]*******   LLIMPORTMEDS              "Result Below..."       RESULT: BENAZEPRIL-HYDROCHLOROTHIAZIDE TABS 20-25 MG*TAKE ONE (1) TABLET EVERY MORNING FOR BLOOD PRESSURE*07/27/2007*Last Refill: [id number]*******   LLIMPORTMEDS              "Result Below..."       RESULT: AMITRIPTYLINE HCL TABS 25 MG*TAKE ONE (1) TABLET AT BEDTIME FOR FEET*07/27/2007*Last Refill: Unknown*[id number]*******   LLIMPORTMEDS              "Result Below..."       RESULT: AMARYL TABS 4 MG*TAKE TWO (2) TABLETS EACH MORNING*04/07/2007*Last Refill: 07/27/2007*[id number]*******   LLIMPORTMEDS              "Result Below..."       RESULT: ACTOS TABS 30 MG*TAKE ONE (1) TABLET EVERY MORNING*07/27/2007*Last Refill: [id number]*******   LLIMPORTALLS              NKDA***  Note: An exclamation mark (!) indicates a result that was not dispersed into the flowsheet. Document Creation Date: 08/26/2007 3:05 PM _______________________________________________________________________  (1) Order result status: Final Collection or observation date-time: 07/14/2007 Requested date-time: 07/14/2007 Receipt date-time:  Reported date-time: 07/14/2007 Referring Physician:   Ordering Physician:   Specimen Source:  Source: Charlyne Mom Order Number:  Lab site:

## 2010-11-28 NOTE — Progress Notes (Signed)
Summary: Residency says [facility] per Pharmacy/needs meds transferred [facility]   Phone Note Call from Patient Call back at [facility] Phone [phone]   Summary of Call: [facility]. AND THEY WONT GIVE HER HER MEDICATION UNLESS DR. Paizleigh Wilds CALLS TO THEM TO HAVE THEM RELEASED. [facility] [place] [address].  MD spoke to pharmacy and issue is that [facility] told patient that she needed to clarify/verify her address as it lists [facility](request was done in 12/2008 when pt last refilled her meds). [facility] pharm will not give another refill. Meds to be  faxed to [facility].Milagros Evener MD  February 20, 2009 3:24 PM. Initial call taken by: Roberto Scales,  February 20, 2009 2:36 PM  Follow-up for Phone Call        Maudie Mercury called [facility] and they said that the pt's Rx's could be transferred to them all they need are the prescriptions. Pt is aware Follow-up by: Isla Pence,  February 20, 2009 3:11 PM  Additional Follow-up for Phone Call Additional follow up Details #1::        Scripts done.Please fax. Pt still should still  keep her f/u appt with me as we are adjusting her meds for uncontrolled DM. Additional Follow-up by: Milagros Evener MD,  February 20, 2009 3:29 PM    Additional Follow-up for Phone Call Additional follow up Details #2::    pt does have appt made for May 5....... Follow-up by: [place] Shannon,  February 20, 2009 4:07 PM    Prescriptions: METFORMIN HCL 500 MG TABS (METFORMIN HCL) Take one pill AM and PM x 1 week then increase to 2 pills AM and PM  #120 x 2   Entered and Authorized by:   Milagros Evener MD   Signed by:   Milagros Evener MD on 02/20/2009   Method used:   Printed then faxed to ...       [facility] (retail)       [place], [place]  [address]       Ph: [phone]       Fax: [phone]   RxID:   [id number] AMITRIPTYLINE HCL 25 MG TABS  (AMITRIPTYLINE HCL) Take 1 tab by mouth at bedtime for feet/sleep  #30 x 1   Entered and Authorized by:   Milagros Evener MD   Signed by:   Milagros Evener MD on 02/20/2009   Method used:   Printed then faxed to ...       [facility] (retail)       [place], [place]  [address]       Ph: [phone] [place]       Fax: [phone]   RxID:   [id number] MIRALAX   POWD (POLYETHYLENE GLYCOL 3350) 17g by mouth once daily as needed constipation  #1 month x 2   Entered and Authorized by:   Milagros Evener MD   Signed by:   Milagros Evener MD on 02/20/2009   Method used:   Printed then faxed to ...       [facility] (retail)       [place], [place]  [address]       Ph: [phone]       Fax: [phone]   RxID:  [id number] ACTOS 45 MG TABS (PIOGLITAZONE HCL) 1 by mouth daily  #30 x 2   Entered and Authorized by:   Milagros Evener MD   Signed by:   Milagros Evener MD on 02/20/2009   Method used:   Printed then faxed to ...       [facility] (retail)       [place], [place]  [address]       Ph: [phone] [place]       Fax: [phone]   RxID:   [id number] BENAZEPRIL-HYDROCHLOROTHIAZIDE 20-12.5 MG  TABS (BENAZEPRIL-HYDROCHLOROTHIAZIDE) Take 2 tablets by mouth  every morning  #60 x 2   Entered and Authorized by:   Milagros Evener MD   Signed by:   Milagros Evener MD on 02/20/2009   Method used:   Printed then faxed to ...       [facility] (retail)       [place], [place]  [address]       Ph: [phone] [place]       Fax: [phone]   RxID:   [id number] LEVEMIR FLEXPEN 100 UNIT/ML  SOLN (INSULIN DETEMIR) Inject 65 units Subcutaneously each day  #1 x 2   Entered and Authorized by:   Milagros Evener MD   Signed by:   Milagros Evener MD on  02/20/2009   Method used:   Printed then faxed to ...       [facility] (retail)       [place], [place]  [address]       Ph: [phone]       Fax: [phone]   RxID:   [id number] GLIMEPIRIDE 4 MG  TABS (GLIMEPIRIDE) Take 2 pills  by mouth    every morning  #60 x 2   Entered and Authorized by:   Milagros Evener MD   Signed by:   Milagros Evener MD on 02/20/2009   Method used:   Printed then faxed to ...       [facility] (retail)       [place], [place]  [address]       Ph: [phone]       Fax: [phone]   RxID:   [id number] CRESTOR 20 MG  TABS (ROSUVASTATIN CALCIUM) Take 1 tablet by mouth once a day  #30 x 2   Entered and Authorized by:   Milagros Evener MD   Signed by:   Milagros Evener MD on 02/20/2009   Method used:   Printed then faxed to ...       [facility] (retail)       [place], [place]  [address]       Ph: [phone]       Fax: [phone]   RxID:   [id number]

## 2011-01-15 LAB — DIFFERENTIAL
Basophils Absolute: 0 10*3/uL (ref 0.0–0.1)
Basophils Relative: 0 % (ref 0–1)
Eosinophils Absolute: 0 10*3/uL (ref 0.0–0.7)
Eosinophils Relative: 0 % (ref 0–5)
Lymphocytes Relative: 22 % (ref 12–46)
Lymphs Abs: 2.1 10*3/uL (ref 0.7–4.0)
Monocytes Absolute: 0.4 10*3/uL (ref 0.1–1.0)
Monocytes Relative: 4 % (ref 3–12)
Neutro Abs: 7.4 10*3/uL (ref 1.7–7.7)
Neutrophils Relative %: 74 % (ref 43–77)

## 2011-01-15 LAB — CBC
HCT: 33.5 % — ABNORMAL LOW (ref 36.0–46.0)
Hemoglobin: 11.4 g/dL — ABNORMAL LOW (ref 12.0–15.0)
MCHC: 34 g/dL (ref 30.0–36.0)
MCV: 84.7 fL (ref 78.0–100.0)
Platelets: 307 10*3/uL (ref 150–400)
RBC: 3.95 MIL/uL (ref 3.87–5.11)
RDW: 13.4 % (ref 11.5–15.5)
WBC: 10 10*3/uL (ref 4.0–10.5)

## 2011-01-15 LAB — GLUCOSE, CAPILLARY
Glucose-Capillary: 239 mg/dL — ABNORMAL HIGH (ref 70–99)
Glucose-Capillary: 289 mg/dL — ABNORMAL HIGH (ref 70–99)
Glucose-Capillary: 485 mg/dL — ABNORMAL HIGH (ref 70–99)

## 2011-02-11 LAB — DIFFERENTIAL
Basophils Absolute: 0 10*3/uL (ref 0.0–0.1)
Basophils Relative: 0 % (ref 0–1)
Eosinophils Absolute: 0 10*3/uL (ref 0.0–0.7)
Eosinophils Relative: 0 % (ref 0–5)
Lymphocytes Relative: 8 % — ABNORMAL LOW (ref 12–46)
Lymphs Abs: 1.4 10*3/uL (ref 0.7–4.0)
Monocytes Absolute: 0.6 10*3/uL (ref 0.1–1.0)
Monocytes Relative: 4 % (ref 3–12)
Neutro Abs: 15.1 10*3/uL — ABNORMAL HIGH (ref 1.7–7.7)
Neutrophils Relative %: 88 % — ABNORMAL HIGH (ref 43–77)

## 2011-02-11 LAB — GLUCOSE, CAPILLARY
Glucose-Capillary: 184 mg/dL — ABNORMAL HIGH (ref 70–99)
Glucose-Capillary: 264 mg/dL — ABNORMAL HIGH (ref 70–99)
Glucose-Capillary: 328 mg/dL — ABNORMAL HIGH (ref 70–99)
Glucose-Capillary: 354 mg/dL — ABNORMAL HIGH (ref 70–99)
Glucose-Capillary: 445 mg/dL — ABNORMAL HIGH (ref 70–99)

## 2011-02-11 LAB — POCT I-STAT, CHEM 8
BUN: 31 mg/dL — ABNORMAL HIGH (ref 6–23)
Calcium, Ion: 1.1 mmol/L — ABNORMAL LOW (ref 1.12–1.32)
Chloride: 99 mEq/L (ref 96–112)
Creatinine, Ser: 1.3 mg/dL — ABNORMAL HIGH (ref 0.4–1.2)
Glucose, Bld: 454 mg/dL — ABNORMAL HIGH (ref 70–99)
HCT: 41 % (ref 36.0–46.0)
Hemoglobin: 13.9 g/dL (ref 12.0–15.0)
Potassium: 5.1 mEq/L (ref 3.5–5.1)
Sodium: 132 mEq/L — ABNORMAL LOW (ref 135–145)
TCO2: 25 mmol/L (ref 0–100)

## 2011-02-11 LAB — CBC
HCT: 37.8 % (ref 36.0–46.0)
Hemoglobin: 12.6 g/dL (ref 12.0–15.0)
MCHC: 33.2 g/dL (ref 30.0–36.0)
MCV: 85.2 fL (ref 78.0–100.0)
Platelets: 261 10*3/uL (ref 150–400)
RBC: 4.44 MIL/uL (ref 3.87–5.11)
RDW: 13.8 % (ref 11.5–15.5)
WBC: 17.2 10*3/uL — ABNORMAL HIGH (ref 4.0–10.5)

## 2011-02-11 LAB — URINALYSIS, ROUTINE W REFLEX MICROSCOPIC
Bilirubin Urine: NEGATIVE
Glucose, UA: >1000 mg/dL — AB
Hgb urine dipstick: NEGATIVE
Ketones, ur: 15 mg/dL — AB
Nitrite: NEGATIVE
Protein, ur: NEGATIVE mg/dL
Specific Gravity, Urine: 1.034 — ABNORMAL HIGH (ref 1.005–1.030)
Urobilinogen, UA: 1 mg/dL (ref 0.0–1.0)
pH: 5.5 (ref 5.0–8.0)

## 2011-02-11 LAB — URINE MICROSCOPIC-ADD ON

## 2012-07-16 ENCOUNTER — Encounter (HOSPITAL_BASED_OUTPATIENT_CLINIC_OR_DEPARTMENT_OTHER): Payer: Self-pay | Admitting: *Deleted

## 2012-07-16 ENCOUNTER — Emergency Department (HOSPITAL_BASED_OUTPATIENT_CLINIC_OR_DEPARTMENT_OTHER)
Admission: EM | Admit: 2012-07-16 | Discharge: 2012-07-16 | Disposition: A | Discharge status: Home / Self Care | Payer: Medicare Other | Attending: Emergency Medicine | Admitting: Emergency Medicine

## 2012-07-16 ENCOUNTER — Emergency Department (HOSPITAL_BASED_OUTPATIENT_CLINIC_OR_DEPARTMENT_OTHER): Payer: Medicare Other

## 2012-07-16 DIAGNOSIS — R51 Headache: Secondary | ICD-10-CM | POA: Insufficient documentation

## 2012-07-16 DIAGNOSIS — I10 Essential (primary) hypertension: Secondary | ICD-10-CM | POA: Insufficient documentation

## 2012-07-16 DIAGNOSIS — R5381 Other malaise: Secondary | ICD-10-CM | POA: Insufficient documentation

## 2012-07-16 DIAGNOSIS — E119 Type 2 diabetes mellitus without complications: Secondary | ICD-10-CM | POA: Insufficient documentation

## 2012-07-16 DIAGNOSIS — R42 Dizziness and giddiness: Secondary | ICD-10-CM | POA: Insufficient documentation

## 2012-07-16 DIAGNOSIS — R739 Hyperglycemia, unspecified: Secondary | ICD-10-CM

## 2012-07-16 HISTORY — DX: Hyperlipidemia, unspecified: E78.5

## 2012-07-16 HISTORY — DX: Essential (primary) hypertension: I10

## 2012-07-16 LAB — COMPREHENSIVE METABOLIC PANEL
ALT: 11 U/L (ref 0–35)
AST: 18 U/L (ref 0–37)
Albumin: 3.9 g/dL (ref 3.5–5.2)
Alkaline Phosphatase: 106 U/L (ref 39–117)
BUN: 27 mg/dL — ABNORMAL HIGH (ref 6–23)
CO2: 23 mEq/L (ref 19–32)
Calcium: 9.8 mg/dL (ref 8.4–10.5)
Chloride: 97 mEq/L (ref 96–112)
Creatinine, Ser: 1.1 mg/dL (ref 0.50–1.10)
GFR calc Af Amer: 61 mL/min — ABNORMAL LOW (ref >90)
GFR calc non Af Amer: 53 mL/min — ABNORMAL LOW (ref >90)
Glucose, Bld: 284 mg/dL — ABNORMAL HIGH (ref 70–99)
Potassium: 4.7 mEq/L (ref 3.5–5.1)
Sodium: 133 mEq/L — ABNORMAL LOW (ref 135–145)
Total Bilirubin: 0.2 mg/dL — ABNORMAL LOW (ref 0.3–1.2)
Total Protein: 7.8 g/dL (ref 6.0–8.3)

## 2012-07-16 LAB — CBC WITH DIFFERENTIAL/PLATELET
Basophils Absolute: 0 10*3/uL (ref 0.0–0.1)
Basophils Relative: 0 % (ref 0–1)
Eosinophils Absolute: 0.4 10*3/uL (ref 0.0–0.7)
Eosinophils Relative: 4 % (ref 0–5)
HCT: 38.8 % (ref 36.0–46.0)
Hemoglobin: 13.3 g/dL (ref 12.0–15.0)
Lymphocytes Relative: 44 % (ref 12–46)
Lymphs Abs: 4.1 10*3/uL — ABNORMAL HIGH (ref 0.7–4.0)
MCH: 28 pg (ref 26.0–34.0)
MCHC: 34.3 g/dL (ref 30.0–36.0)
MCV: 81.7 fL (ref 78.0–100.0)
Monocytes Absolute: 0.6 10*3/uL (ref 0.1–1.0)
Monocytes Relative: 7 % (ref 3–12)
Neutro Abs: 4.2 10*3/uL (ref 1.7–7.7)
Neutrophils Relative %: 45 % (ref 43–77)
Platelets: 277 10*3/uL (ref 150–400)
RBC: 4.75 MIL/uL (ref 3.87–5.11)
RDW: 12.7 % (ref 11.5–15.5)
WBC: 9.3 10*3/uL (ref 4.0–10.5)

## 2012-07-16 LAB — URINALYSIS, ROUTINE W REFLEX MICROSCOPIC
Bilirubin Urine: NEGATIVE
Glucose, UA: 500 mg/dL — AB
Hgb urine dipstick: NEGATIVE
Ketones, ur: NEGATIVE mg/dL
Nitrite: NEGATIVE
Protein, ur: NEGATIVE mg/dL
Specific Gravity, Urine: 1.018 (ref 1.005–1.030)
Urobilinogen, UA: 1 mg/dL (ref 0.0–1.0)
pH: 5.5 (ref 5.0–8.0)

## 2012-07-16 LAB — URINE MICROSCOPIC-ADD ON

## 2012-07-16 LAB — GLUCOSE, CAPILLARY: Glucose-Capillary: 261 mg/dL — ABNORMAL HIGH (ref 70–99)

## 2012-07-16 MED ORDER — SODIUM CHLORIDE 0.9 % IV SOLN: INTRAVENOUS | Status: DC

## 2012-07-16 MED ORDER — METFORMIN HCL 500 MG PO TABS: 500.0000 mg | ORAL_TABLET | Freq: Two times a day (BID) | ORAL | Status: DC

## 2012-07-16 MED ORDER — SODIUM CHLORIDE 0.9 % IV SOLN
Freq: Once | INTRAVENOUS | Status: AC
  Administered 2012-07-16: 20:00:00 via INTRAVENOUS

## 2012-07-16 NOTE — ED Provider Notes (Signed)
History     CSN: [id number]  Arrival date & time 07/16/12  1811   First MD Initiated Contact with Patient 07/16/12 1849      Chief Complaint  Patient presents with  . Headache  . Dizziness    (Consider location/radiation/quality/duration/timing/severity/associated sxs/prior treatment) Patient is a 61 y.o. female presenting with weakness. The history is provided by the patient and medical records. No language interpreter was used.  Weakness The primary symptoms include headaches. Primary symptoms do not include fever. Primary symptoms comment: Nausea, fatigue, The symptoms began 5 to 7 days ago. The symptoms are unchanged. The neurological symptoms are diffuse. Context: She has diabetes, hypertension, hyperlipidemia.  The headache began more than 2 days ago. The headache developed gradually. Headache is a new problem. The headache is present rarely. Pain scale: Moderately severe occipital headaches. Location/region(s) of the headache: occipital. The headache is associated with nothing. The headache is associated with weakness.  Additional symptoms include weakness. Medical issues also include hypertension. Associated medical issues comments: Diabetes.    Past Medical History  Diagnosis Date  . Hypertension   . Diabetes mellitus   . Hyperlipemia     Past Surgical History  Procedure Date  . Appendectomy   . Tonsillectomy   . Tubal ligation     History reviewed. No pertinent family history.  History  Substance Use Topics  . Smoking status: Never Smoker   . Smokeless tobacco: Not on file  . Alcohol Use: No    OB History    Grav Para Term Preterm Abortions TAB SAB Ect Mult Living                  Review of Systems  Constitutional: Negative for fever and chills.  Eyes: Negative.   Respiratory: Negative.   Cardiovascular: Negative.   Gastrointestinal: Negative.   Genitourinary: Negative.   Musculoskeletal: Negative.   Skin: Negative.   Neurological: Positive for  weakness and headaches.  Psychiatric/Behavioral: Negative.     Allergies  Review of patient's allergies indicates no known allergies.  Home Medications  No current outpatient prescriptions on file.  BP 173/87  Pulse 67  Temp 97.5 F (36.4 C) (Oral)  Resp 16  Ht 5\' 2"  (1.575 m)  Wt 190 lb (86.183 kg)  BMI 34.75 kg/m2  SpO2 97%  Physical Exam  Nursing note and vitals reviewed. Constitutional: She is oriented to person, place, and time. She appears well-developed and well-nourished. No distress.       In mild distress complaining of headache.  HENT:  Head: Normocephalic and atraumatic.  Right Ear: External ear normal.  Left Ear: External ear normal.  Mouth/Throat: Oropharynx is clear and moist.       Pain localized to occipital region.  There is no bony deformity of the skull or cervical spine, no abnormality of the scalp.  Eyes: Conjunctivae normal and EOM are normal. Pupils are equal, round, and reactive to light.  Neck: Normal range of motion. Neck supple.  Cardiovascular: Normal rate, regular rhythm and normal heart sounds.   Pulmonary/Chest: Effort normal and breath sounds normal.  Abdominal: Soft. Bowel sounds are normal. She exhibits no distension and no mass. There is no tenderness. There is no guarding.  Musculoskeletal: Normal range of motion. She exhibits no edema and no tenderness.  Neurological: She is alert and oriented to person, place, and time.       No sensory or motor deficit.  Skin: Skin is warm and dry.  Psychiatric: She has a  normal mood and affect. Her behavior is normal.    ED Course  Procedures (including critical care time)   Date: 07/16/2012  Rate:58  Rhythm: sinus bradycardia  QRS Axis: normal  Intervals: normal  ST/T Wave abnormalities: normal  Conduction Disutrbances:none  Narrative Interpretation: Normal EKG  Old EKG Reviewed: unchanged   7:13 PM Pt seen --> physical exam performed.  Lab workup, IV fluids ordered.  8:47  PM Results for orders placed during the hospital encounter of 07/16/12  GLUCOSE, CAPILLARY      Component Value Range   Glucose-Capillary 261 (*) 70 - 99 mg/dL  CBC WITH DIFFERENTIAL      Component Value Range   WBC 9.3  4.0 - 10.5 K/uL   RBC 4.75  3.87 - 5.11 MIL/uL   Hemoglobin 13.3  12.0 - 15.0 g/dL   HCT 38.8  36.0 - 46.0 %   MCV 81.7  78.0 - 100.0 fL   MCH 28.0  26.0 - 34.0 pg   MCHC 34.3  30.0 - 36.0 g/dL   RDW 12.7  11.5 - 15.5 %   Platelets 277  150 - 400 K/uL   Neutrophils Relative 45  43 - 77 %   Neutro Abs 4.2  1.7 - 7.7 K/uL   Lymphocytes Relative 44  12 - 46 %   Lymphs Abs 4.1 (*) 0.7 - 4.0 K/uL   Monocytes Relative 7  3 - 12 %   Monocytes Absolute 0.6  0.1 - 1.0 K/uL   Eosinophils Relative 4  0 - 5 %   Eosinophils Absolute 0.4  0.0 - 0.7 K/uL   Basophils Relative 0  0 - 1 %   Basophils Absolute 0.0  0.0 - 0.1 K/uL  COMPREHENSIVE METABOLIC PANEL      Component Value Range   Sodium 133 (*) 135 - 145 mEq/L   Potassium 4.7  3.5 - 5.1 mEq/L   Chloride 97  96 - 112 mEq/L   CO2 23  19 - 32 mEq/L   Glucose, Bld 284 (*) 70 - 99 mg/dL   BUN 27 (*) 6 - 23 mg/dL   Creatinine, Ser 1.10  0.50 - 1.10 mg/dL   Calcium 9.8  8.4 - 10.5 mg/dL   Total Protein 7.8  6.0 - 8.3 g/dL   Albumin 3.9  3.5 - 5.2 g/dL   AST 18  0 - 37 U/L   ALT 11  0 - 35 U/L   Alkaline Phosphatase 106  39 - 117 U/L   Total Bilirubin 0.2 (*) 0.3 - 1.2 mg/dL   GFR calc non Af Amer 53 (*) >90 mL/min   GFR calc Af Amer 61 (*) >90 mL/min  URINALYSIS, ROUTINE W REFLEX MICROSCOPIC      Component Value Range   Color, Urine YELLOW  YELLOW   APPearance CLEAR  CLEAR   Specific Gravity, Urine 1.018  1.005 - 1.030   pH 5.5  5.0 - 8.0   Glucose, UA 500 (*) NEGATIVE mg/dL   Hgb urine dipstick NEGATIVE  NEGATIVE   Bilirubin Urine NEGATIVE  NEGATIVE   Ketones, ur NEGATIVE  NEGATIVE mg/dL   Protein, ur NEGATIVE  NEGATIVE mg/dL   Urobilinogen, UA 1.0  0.0 - 1.0 mg/dL   Nitrite NEGATIVE  NEGATIVE   Leukocytes,  UA SMALL (*) NEGATIVE  URINE MICROSCOPIC-ADD ON      Component Value Range   Squamous Epithelial / LPF RARE  RARE   WBC, UA 7-10  <3 WBC/hpf  Bacteria, UA MANY (*) RARE   Ct Head Wo Contrast  07/16/2012  *RADIOLOGY REPORT*  Clinical Data: Hyperglycemia  CT HEAD WITHOUT CONTRAST  Technique:  Contiguous axial images were obtained from the base of the skull through the vertex without contrast.  Comparison: None.  Findings: No skull fracture is noted.  The mastoid air cells are unremarkable.  There is mucosal thickening with partial opacification left sphenoid sinus.  No intracranial hemorrhage, mass effect or midline shift.  No acute infarction.  No hydrocephalus.  The gray and white matter differentiation is preserved.  No mass lesion is noted on this unenhanced scan.  IMPRESSION: No acute intracranial abnormality.  Mucosal thickening with partial opacification left sphenoid sinus.   Original Report Authenticated By: Lahoma Crocker, M.D.     Blood sugar is elevated at 284.  CBC and UA negative.  CT of head negative.  EKG benign.  I wrote pt a prescription for her metformin.  She needs to have continuity of followup for her diabetes and hypertension.  Referred her to Willey Blade, M.D., as her provider at that practice has left.  1. Hyperglycemia       Mylinda Latina III, MD 07/16/12 2056

## 2012-07-16 NOTE — ED Notes (Signed)
Pt c/o h/a and dizzy x 6 days

## 2012-07-19 LAB — URINE CULTURE: Colony Count: >=100000

## 2012-07-20 NOTE — ED Notes (Signed)
+   Urine Chart sent to EDP office for review. 

## 2014-03-23 ENCOUNTER — Encounter: Payer: Medicare Other | Attending: Family Medicine | Admitting: *Deleted

## 2014-03-23 ENCOUNTER — Encounter: Payer: Self-pay | Admitting: *Deleted

## 2014-03-23 VITALS — Ht 62.5 in | Wt 177.0 lb

## 2014-03-23 DIAGNOSIS — Z713 Dietary counseling and surveillance: Secondary | ICD-10-CM | POA: Insufficient documentation

## 2014-03-23 DIAGNOSIS — E1149 Type 2 diabetes mellitus with other diabetic neurological complication: Secondary | ICD-10-CM

## 2014-03-23 DIAGNOSIS — E119 Type 2 diabetes mellitus without complications: Secondary | ICD-10-CM | POA: Insufficient documentation

## 2014-03-23 NOTE — Patient Instructions (Addendum)
Plan:  Aim for 2-3 Carb Choices per meal (30-45 grams) +/- 1 either way  Aim for 0-15 Carbs per snack if hungry  Include protein in moderation with your meals and snacks Consider reading food labels for Total Carbohydrate and Fat Grams of foods Consider  increasing your activity level by walking for 30 minutes daily 5 days per week as tolerated (GOAL) Consider checking BG at alternate times per day as directed by MD  Consider taking medication  as directed by MD  If you forget your Levimir in the evening, take it as soon as you awaken Have eve exam performed ASAP  Limit OJ to 4 OZ per serving Switch to diet Pepsi vs regular pepsi Discontinue Pecan rolls  Substitute: yogurt, cheese, apple sauce, 1/2 banana, peanut butter, cottage cheese Reduce to 2% milk

## 2014-03-23 NOTE — Progress Notes (Signed)
Appt start time: 0800 end time:  0930.   Assessment:  Patient was seen on  03/23/14 for individual diabetes education. Melissa Howe lives with her children and grandchildren, 5 in total. She does most of the shopping and food preparation. She notes that she has not taken her diabetes seriously in the past. She is ready to take it seriously now. We will progress slowly to make changes to increase her potential for success.  Current HbA1c: 14.0%  Preferred Learning Style:   No preference indicated   Learning Readiness:   Ready  MEDICATIONS: Metformin, Levimir HS, Novolog sliding scale  DIETARY INTAKE:  24-hr recall:  B ( AM): skip,or bacon, eggs, toast, water or OJ 16oz Snk ( AM): potato chips, pecan rolls  L ( PM): PB&J sandwich, 16oz OJ juice Snk ( PM): none D ( PM): chicken, fish, vegetables, starch, OJ or water Snk ( PM): pecan rolls Beverages: OJ juice, water, 2-3 per day regular pepsi, sweet tea  Usual physical activity: none  Intervention:  Nutrition counseling provided.  Discussed diabetes disease process and treatment options.  Discussed physiology of diabetes and role of obesity on insulin resistance.  Encouraged moderate weight reduction to improve glucose levels.  Discussed role of medications and diet in glucose control  Provided education on macronutrients on glucose levels.  Provided education on carb counting, importance of regularly scheduled meals/snacks, and meal planning  Discussed effects of physical activity on glucose levels and long-term glucose control.  Recommended 150 minutes of physical activity/week.  Reviewed patient medications.  Discussed role of medication on blood glucose and possible side effects  Discussed blood glucose monitoring and interpretation.  Discussed recommended target ranges and individual ranges.    Described short-term complications: hyper- and hypo-glycemia.  Discussed causes,symptoms, and treatment options.  Discussed prevention,  detection, and treatment of long-term complications.  Discussed the role of prolonged elevated glucose levels on body systems.  Discussed role of stress on blood glucose levels and discussed strategies to manage psychosocial issues.  Discussed recommendations for long-term diabetes self-care.  Established checklist for medical, dental, and emotional self-care.  Plan:  Aim for 2-3 Carb Choices per meal (30-45 grams) +/- 1 either way  Aim for 0-15 Carbs per snack if hungry  Include protein in moderation with your meals and snacks Consider reading food labels for Total Carbohydrate and Fat Grams of foods Consider  increasing your activity level by walking for 30 minutes daily 5 days per week as tolerated (GOAL) Consider checking BG at alternate times per day as directed by MD  Consider taking medication  as directed by MD  If you forget your Levimir in the evening, take it as soon as you awaken Have eve exam performed ASAP  Limit OJ to 4 OZ per serving Switch to diet Pepsi vs regular pepsi Discontinue Pecan rolls  Substitute: yogurt, cheese, apple sauce, 1/2 banana, peanut butter, cottage cheese Reduce to 2% milk  Teaching Method Utilized:  Visual Auditory Hands on  Handouts given during visit include: Living Well with Diabetes Carb Counting and Food Label handouts Meal Plan Card My Plate  Snack sheet  Barriers to learning/adherence to lifestyle change: committment  Diabetes self-care support plan:   [facility] support group  family  Demonstrated degree of understanding via:  Teach Back   Monitoring/Evaluation:  Dietary intake, exercise, test glucose, and body weight f/u  in 6 week(s).

## 2014-04-25 ENCOUNTER — Encounter: Payer: Medicare Other | Attending: Family Medicine | Admitting: *Deleted

## 2014-04-25 ENCOUNTER — Encounter: Payer: Self-pay | Admitting: *Deleted

## 2014-04-25 VITALS — Ht 62.5 in | Wt 180.8 lb

## 2014-04-25 DIAGNOSIS — Z713 Dietary counseling and surveillance: Secondary | ICD-10-CM | POA: Insufficient documentation

## 2014-04-25 DIAGNOSIS — E119 Type 2 diabetes mellitus without complications: Secondary | ICD-10-CM | POA: Insufficient documentation

## 2014-04-25 DIAGNOSIS — E1149 Type 2 diabetes mellitus with other diabetic neurological complication: Secondary | ICD-10-CM

## 2014-04-25 NOTE — Progress Notes (Signed)
Ms. Thorup presents today in 4 week follow up for her T2DM. She has make significant improvements in her behavior.  Taking medications daily as directed  Discontinue drinking Orange Juice  Switched from regular pepsi to [place] for breakfast  PLAN:  Nutrition: Look at nutrition label for: "Total Carbohydrates"     Yogurt     Burger  Popcorn - decrease to 3C or less  Try to stick to 2-3 servings or 30-45 grams of carbs per meal  Exercise: Walking: the dead end 3 times per week  Glucose: 2hpp following burger and cantelope  260mg /dl             Document glucose in log book and take to provider at time of visit (log book given)  [place] job on all the improvements you have made!!!!!!  Return: As needed

## 2014-04-25 NOTE — Patient Instructions (Signed)
Fabulous job on all the improvements you have made!!!!!!  Look at nutrition label for: "Total Carbohydrates"     Yogurt     Popcorn - decrease to 3C or less     Burger  Try to stick to 2-3 servings or 30-45 grams of carbs per meal  Walking: the dead end 3 times per week  Document glucose in log book

## 2015-07-11 ENCOUNTER — Encounter (HOSPITAL_BASED_OUTPATIENT_CLINIC_OR_DEPARTMENT_OTHER): Payer: Self-pay

## 2015-07-11 ENCOUNTER — Emergency Department (HOSPITAL_BASED_OUTPATIENT_CLINIC_OR_DEPARTMENT_OTHER)
Admission: EM | Admit: 2015-07-11 | Discharge: 2015-07-11 | Disposition: A | Discharge status: Home / Self Care | Payer: Medicare Other | Source: Home / Self Care | Attending: Emergency Medicine | Admitting: Emergency Medicine

## 2015-07-11 DIAGNOSIS — R739 Hyperglycemia, unspecified: Secondary | ICD-10-CM

## 2015-07-11 DIAGNOSIS — Z79899 Other long term (current) drug therapy: Secondary | ICD-10-CM | POA: Insufficient documentation

## 2015-07-11 DIAGNOSIS — R632 Polyphagia: Secondary | ICD-10-CM

## 2015-07-11 DIAGNOSIS — Z9119 Patient's noncompliance with other medical treatment and regimen: Secondary | ICD-10-CM

## 2015-07-11 DIAGNOSIS — E785 Hyperlipidemia, unspecified: Secondary | ICD-10-CM

## 2015-07-11 DIAGNOSIS — I10 Essential (primary) hypertension: Secondary | ICD-10-CM | POA: Insufficient documentation

## 2015-07-11 DIAGNOSIS — Z794 Long term (current) use of insulin: Secondary | ICD-10-CM | POA: Insufficient documentation

## 2015-07-11 DIAGNOSIS — Z87891 Personal history of nicotine dependence: Secondary | ICD-10-CM | POA: Insufficient documentation

## 2015-07-11 DIAGNOSIS — R42 Dizziness and giddiness: Secondary | ICD-10-CM

## 2015-07-11 DIAGNOSIS — E1165 Type 2 diabetes mellitus with hyperglycemia: Secondary | ICD-10-CM

## 2015-07-11 DIAGNOSIS — Z9114 Patient's other noncompliance with medication regimen: Secondary | ICD-10-CM

## 2015-07-11 DIAGNOSIS — N390 Urinary tract infection, site not specified: Secondary | ICD-10-CM | POA: Diagnosis not present

## 2015-07-11 LAB — COMPREHENSIVE METABOLIC PANEL
ALT: 11 U/L — ABNORMAL LOW (ref 14–54)
AST: 17 U/L (ref 15–41)
Albumin: 3.3 g/dL — ABNORMAL LOW (ref 3.5–5.0)
Alkaline Phosphatase: 72 U/L (ref 38–126)
Anion gap: 9 (ref 5–15)
BUN: 17 mg/dL (ref 6–20)
CO2: 29 mmol/L (ref 22–32)
Calcium: 9.4 mg/dL (ref 8.9–10.3)
Chloride: 100 mmol/L — ABNORMAL LOW (ref 101–111)
Creatinine, Ser: 1 mg/dL (ref 0.44–1.00)
GFR calc Af Amer: >60 mL/min (ref >60)
GFR calc non Af Amer: 58 mL/min — ABNORMAL LOW (ref >60)
Glucose, Bld: 428 mg/dL — ABNORMAL HIGH (ref 65–99)
Potassium: 4.5 mmol/L (ref 3.5–5.1)
Sodium: 138 mmol/L (ref 135–145)
Total Bilirubin: 0.7 mg/dL (ref 0.3–1.2)
Total Protein: 6.9 g/dL (ref 6.5–8.1)

## 2015-07-11 LAB — CBC WITH DIFFERENTIAL/PLATELET
Basophils Absolute: 0 10*3/uL (ref 0.0–0.1)
Basophils Relative: 0 %
Eosinophils Absolute: 0.1 10*3/uL (ref 0.0–0.7)
Eosinophils Relative: 0 %
HCT: 38.7 % (ref 36.0–46.0)
Hemoglobin: 12.7 g/dL (ref 12.0–15.0)
Lymphocytes Relative: 15 %
Lymphs Abs: 1.7 10*3/uL (ref 0.7–4.0)
MCH: 28 pg (ref 26.0–34.0)
MCHC: 32.8 g/dL (ref 30.0–36.0)
MCV: 85.2 fL (ref 78.0–100.0)
Monocytes Absolute: 0.5 10*3/uL (ref 0.1–1.0)
Monocytes Relative: 5 %
Neutro Abs: 9 10*3/uL — ABNORMAL HIGH (ref 1.7–7.7)
Neutrophils Relative %: 80 %
Platelets: 301 10*3/uL (ref 150–400)
RBC: 4.54 MIL/uL (ref 3.87–5.11)
RDW: 12.1 % (ref 11.5–15.5)
WBC: 11.4 10*3/uL — ABNORMAL HIGH (ref 4.0–10.5)

## 2015-07-11 LAB — URINE MICROSCOPIC-ADD ON

## 2015-07-11 LAB — URINALYSIS, ROUTINE W REFLEX MICROSCOPIC
Bilirubin Urine: NEGATIVE
Glucose, UA: >1000 mg/dL — AB
Ketones, ur: 15 mg/dL — AB
Nitrite: NEGATIVE
Protein, ur: >300 mg/dL — AB
Specific Gravity, Urine: 1.04 — ABNORMAL HIGH (ref 1.005–1.030)
Urobilinogen, UA: 1 mg/dL (ref 0.0–1.0)
pH: 5.5 (ref 5.0–8.0)

## 2015-07-11 LAB — CBG MONITORING, ED
Glucose-Capillary: 389 mg/dL — ABNORMAL HIGH (ref 65–99)
Glucose-Capillary: 319 mg/dL — ABNORMAL HIGH (ref 65–99)

## 2015-07-11 LAB — LIPASE, BLOOD: Lipase: 25 U/L (ref 22–51)

## 2015-07-11 MED ORDER — AMLODIPINE BESYLATE 5 MG PO TABS: 5.0000 mg | ORAL_TABLET | Freq: Every day | ORAL | Status: DC

## 2015-07-11 MED ORDER — ONDANSETRON 4 MG PO TBDP: 4.0000 mg | ORAL_TABLET | Freq: Three times a day (TID) | ORAL | Status: DC | PRN

## 2015-07-11 MED ORDER — LISINOPRIL-HYDROCHLOROTHIAZIDE 20-12.5 MG PO TABS: 1.0000 | ORAL_TABLET | Freq: Every day | ORAL | Status: DC

## 2015-07-11 MED ORDER — INSULIN DETEMIR 100 UNIT/ML ~~LOC~~ SOLN: 40.0000 [IU] | Freq: Every day | SUBCUTANEOUS | Status: DC

## 2015-07-11 MED ORDER — CARVEDILOL 6.25 MG PO TABS: 6.2500 mg | ORAL_TABLET | Freq: Two times a day (BID) | ORAL | Status: DC

## 2015-07-11 MED ORDER — NITROFURANTOIN MONOHYD MACRO 100 MG PO CAPS: 100.0000 mg | ORAL_CAPSULE | Freq: Two times a day (BID) | ORAL | Status: DC

## 2015-07-11 MED ORDER — SODIUM CHLORIDE 0.9 % IV BOLUS (SEPSIS)
500.0000 mL | Freq: Once | INTRAVENOUS | Status: AC
  Administered 2015-07-11: 500 mL via INTRAVENOUS

## 2015-07-11 MED ORDER — METFORMIN HCL 500 MG PO TABS: 1000.0000 mg | ORAL_TABLET | Freq: Two times a day (BID) | ORAL | Status: DC

## 2015-07-11 MED ORDER — AMLODIPINE BESYLATE 5 MG PO TABS
5.0000 mg | ORAL_TABLET | Freq: Once | ORAL | Status: AC
  Administered 2015-07-11: 5 mg via ORAL
  Filled 2015-07-11: qty 1

## 2015-07-11 MED ORDER — CARVEDILOL 6.25 MG PO TABS
6.2500 mg | ORAL_TABLET | Freq: Two times a day (BID) | ORAL | Status: DC
  Filled 2015-07-11: qty 1

## 2015-07-11 MED ORDER — ROSUVASTATIN CALCIUM 20 MG PO TABS: 20.0000 mg | ORAL_TABLET | Freq: Every day | ORAL | Status: DC

## 2015-07-11 MED ORDER — INSULIN ASPART PROT & ASPART (70-30 MIX) 100 UNIT/ML ~~LOC~~ SUSP: 15.0000 [IU] | Freq: Three times a day (TID) | SUBCUTANEOUS | Status: DC

## 2015-07-11 MED ORDER — INSULIN REGULAR HUMAN 100 UNIT/ML IJ SOLN
8.0000 [IU] | Freq: Once | INTRAMUSCULAR | Status: AC
  Administered 2015-07-11: 8 [IU] via INTRAVENOUS
  Filled 2015-07-11: qty 1

## 2015-07-11 NOTE — ED Notes (Signed)
Water taken to pt for po challenge

## 2015-07-11 NOTE — Discharge Instructions (Signed)
Hyperglycemia °Hyperglycemia occurs when the glucose (sugar) in your blood is too high. Hyperglycemia can happen for many reasons, but it most often happens to people who do not know they have diabetes or are not managing their diabetes properly.  °CAUSES  °Whether you have diabetes or not, there are other causes of hyperglycemia. Hyperglycemia can occur when you have diabetes, but it can also occur in other situations that you might not be as aware of, such as: °Diabetes °· If you have diabetes and are having problems controlling your blood glucose, hyperglycemia could occur because of some of the following reasons: °· Not following your meal plan. °· Not taking your diabetes medications or not taking it properly. °· Exercising less or doing less activity than you normally do. °· Being sick. °Pre-diabetes °· This cannot be ignored. Before people develop Type 2 diabetes, they almost always have "pre-diabetes." This is when your blood glucose levels are higher than normal, but not yet high enough to be diagnosed as diabetes. Research has shown that some long-term damage to the body, especially the heart and circulatory system, may already be occurring during pre-diabetes. If you take action to manage your blood glucose when you have pre-diabetes, you may delay or prevent Type 2 diabetes from developing. °Stress °· If you have diabetes, you may be "diet" controlled or on oral medications or insulin to control your diabetes. However, you may find that your blood glucose is higher than usual in the hospital whether you have diabetes or not. This is often referred to as "stress hyperglycemia." Stress can elevate your blood glucose. This happens because of hormones put out by the body during times of stress. If stress has been the cause of your high blood glucose, it can be followed regularly by your caregiver. That way he/she can make sure your hyperglycemia does not continue to get worse or progress to  diabetes. °Steroids °· Steroids are medications that act on the infection fighting system (immune system) to block inflammation or infection. One side effect can be a rise in blood glucose. Most people can produce enough extra insulin to allow for this rise, but for those who cannot, steroids make blood glucose levels go even higher. It is not unusual for steroid treatments to "uncover" diabetes that is developing. It is not always possible to determine if the hyperglycemia will go away after the steroids are stopped. A special blood test called an A1c is sometimes done to determine if your blood glucose was elevated before the steroids were started. °SYMPTOMS °· Thirsty. °· Frequent urination. °· Dry mouth. °· Blurred vision. °· Tired or fatigue. °· Weakness. °· Sleepy. °· Tingling in feet or leg. °DIAGNOSIS  °Diagnosis is made by monitoring blood glucose in one or all of the following ways: °· A1c test. This is a chemical found in your blood. °· Fingerstick blood glucose monitoring. °· Laboratory results. °TREATMENT  °First, knowing the cause of the hyperglycemia is important before the hyperglycemia can be treated. Treatment may include, but is not be limited to: °· Education. °· Change or adjustment in medications. °· Change or adjustment in meal plan. °· Treatment for an illness, infection, etc. °· More frequent blood glucose monitoring. °· Change in exercise plan. °· Decreasing or stopping steroids. °· Lifestyle changes. °HOME CARE INSTRUCTIONS  °· Test your blood glucose as directed. °· Exercise regularly. Your caregiver will give you instructions about exercise. Pre-diabetes or diabetes which comes on with stress is helped by exercising. °· Eat wholesome,   balanced meals. Eat often and at regular, fixed times. Your caregiver or nutritionist will give you a meal plan to guide your sugar intake.  Being at an ideal weight is important. If needed, losing as little as 10 to 15 pounds may help improve blood  glucose levels. SEEK MEDICAL CARE IF:   You have questions about medicine, activity, or diet.  You continue to have symptoms (problems such as increased thirst, urination, or weight gain). SEEK IMMEDIATE MEDICAL CARE IF:   You are vomiting or have diarrhea.  Your breath smells fruity.  You are breathing faster or slower.  You are very sleepy or incoherent.  You have numbness, tingling, or pain in your feet or hands.  You have chest pain.  Your symptoms get worse even though you have been following your caregiver's orders.  If you have any other questions or concerns. Document Released: 04/08/2001 Document Revised: 01/05/2012 Document Reviewed: 02/09/2012 [facility] Patient Information 2015 [place], [place]. This information is not intended to replace advice given to you by your health care provider. Make sure you discuss any questions you have with your health care provider.  Hypertension Hypertension, commonly called high blood pressure, is when the force of blood pumping through your arteries is too strong. Your arteries are the blood vessels that carry blood from your heart throughout your body. A blood pressure reading consists of a higher number over a lower number, such as 110/72. The higher number (systolic) is the pressure inside your arteries when your heart pumps. The lower number (diastolic) is the pressure inside your arteries when your heart relaxes. Ideally you want your blood pressure below 120/80. Hypertension forces your heart to work harder to pump blood. Your arteries may become narrow or stiff. Having hypertension puts you at risk for heart disease, stroke, and other problems.  RISK FACTORS Some risk factors for high blood pressure are controllable. Others are not.  Risk factors you cannot control include:   Race. You may be at higher risk if you are African American.  Age. Risk increases with age.  Gender. Men are at higher risk than women before age 67 years.  After age 55, women are at higher risk than men. Risk factors you can control include:  Not getting enough exercise or physical activity.  Being overweight.  Getting too much fat, sugar, calories, or salt in your diet.  Drinking too much alcohol. SIGNS AND SYMPTOMS Hypertension does not usually cause signs or symptoms. Extremely high blood pressure (hypertensive crisis) may cause headache, anxiety, shortness of breath, and nosebleed. DIAGNOSIS  To check if you have hypertension, your health care provider will measure your blood pressure while you are seated, with your arm held at the level of your heart. It should be measured at least twice using the same arm. Certain conditions can cause a difference in blood pressure between your right and left arms. A blood pressure reading that is higher than normal on one occasion does not mean that you need treatment. If one blood pressure reading is high, ask your health care provider about having it checked again. TREATMENT  Treating high blood pressure includes making lifestyle changes and possibly taking medicine. Living a healthy lifestyle can help lower high blood pressure. You may need to change some of your habits. Lifestyle changes may include:  Following the DASH diet. This diet is high in fruits, vegetables, and whole grains. It is low in salt, red meat, and added sugars.  Getting at least 2 hours of  brisk physical activity every week.  Losing weight if necessary.  Not smoking.  Limiting alcoholic beverages.  Learning ways to reduce stress. If lifestyle changes are not enough to get your blood pressure under control, your health care provider may prescribe medicine. You may need to take more than one. Work closely with your health care provider to understand the risks and benefits. HOME CARE INSTRUCTIONS  Have your blood pressure rechecked as directed by your health care provider.   Take medicines only as directed by your health  care provider. Follow the directions carefully. Blood pressure medicines must be taken as prescribed. The medicine does not work as well when you skip doses. Skipping doses also puts you at risk for problems.   Do not smoke.   Monitor your blood pressure at home as directed by your health care provider. SEEK MEDICAL CARE IF:   You think you are having a reaction to medicines taken.  You have recurrent headaches or feel dizzy.  You have swelling in your ankles.  You have trouble with your vision. SEEK IMMEDIATE MEDICAL CARE IF:  You develop a severe headache or confusion.  You have unusual weakness, numbness, or feel faint.  You have severe chest or abdominal pain.  You vomit repeatedly.  You have trouble breathing. MAKE SURE YOU:   Understand these instructions.  Will watch your condition.  Will get help right away if you are not doing well or get worse. Document Released: 10/13/2005 Document Revised: 02/27/2014 Document Reviewed: 08/05/2013 [facility] Patient Information 2015 [place], [place]. This information is not intended to replace advice given to you by your health care provider. Make sure you discuss any questions you have with your health care provider. Urinary Tract Infection Urinary tract infections (UTIs) can develop anywhere along your urinary tract. Your urinary tract is your body's drainage system for removing wastes and extra water. Your urinary tract includes two kidneys, two ureters, a bladder, and a urethra. Your kidneys are a pair of bean-shaped organs. Each kidney is about the size of your fist. They are located below your ribs, one on each side of your spine. CAUSES Infections are caused by microbes, which are microscopic organisms, including fungi, viruses, and bacteria. These organisms are so small that they can only be seen through a microscope. Bacteria are the microbes that most commonly cause UTIs. SYMPTOMS  Symptoms of UTIs may vary by age and  gender of the patient and by the location of the infection. Symptoms in young women typically include a frequent and intense urge to urinate and a painful, burning feeling in the bladder or urethra during urination. Older women and men are more likely to be tired, shaky, and weak and have muscle aches and abdominal pain. A fever may mean the infection is in your kidneys. Other symptoms of a kidney infection include pain in your back or sides below the ribs, nausea, and vomiting. DIAGNOSIS To diagnose a UTI, your caregiver will ask you about your symptoms. Your caregiver also will ask to provide a urine sample. The urine sample will be tested for bacteria and white blood cells. White blood cells are made by your body to help fight infection. TREATMENT  Typically, UTIs can be treated with medication. Because most UTIs are caused by a bacterial infection, they usually can be treated with the use of antibiotics. The choice of antibiotic and length of treatment depend on your symptoms and the type of bacteria causing your infection. HOME CARE INSTRUCTIONS  If you  were prescribed antibiotics, take them exactly as your caregiver instructs you. Finish the medication even if you feel better after you have only taken some of the medication.  Drink enough water and fluids to keep your urine clear or pale yellow.  Avoid caffeine, tea, and carbonated beverages. They tend to irritate your bladder.  Empty your bladder often. Avoid holding urine for long periods of time.  Empty your bladder before and after sexual intercourse.  After a bowel movement, women should cleanse from front to back. Use each tissue only once. SEEK MEDICAL CARE IF:   You have back pain.  You develop a fever.  Your symptoms do not begin to resolve within 3 days. SEEK IMMEDIATE MEDICAL CARE IF:   You have severe back pain or lower abdominal pain.  You develop chills.  You have nausea or vomiting.  You have continued burning or  discomfort with urination. MAKE SURE YOU:   Understand these instructions.  Will watch your condition.  Will get help right away if you are not doing well or get worse. Document Released: 07/23/2005 Document Revised: 04/13/2012 Document Reviewed: 11/21/2011 [facility] Patient Information 2015 [place], [place]. This information is not intended to replace advice given to you by your health care provider. Make sure you discuss any questions you have with your health care provider.

## 2015-07-11 NOTE — ED Notes (Signed)
Pt reports vomiting x 2 weeks.  Reports its bile.  Reports vomited two times today. Denies abdominal pain.  Reports when she starts walking she reports her balance is off.  Pt ambulatory in triage with steady gait.

## 2015-07-11 NOTE — ED Provider Notes (Addendum)
CSN: [id number]     Arrival date & time 07/11/15  1340 History   First MD Initiated Contact with Patient 07/11/15 1410     Chief Complaint  Patient presents with  . Emesis     (Consider location/radiation/quality/duration/timing/severity/associated sxs/prior Treatment) Patient is a 64 y.o. female presenting with vomiting. The history is provided by the patient.  Emesis Severity:  Moderate Associated symptoms: no abdominal pain, no diarrhea and no headaches    patient states she's been feeling bad for about a month now. States she's been out of her medications because she has not been in to see her doctor. States that she did not like that she had to see a new doctor every time. States she's been out of all her medicines except her Levemir. States she is developed nausea and vomiting and has had diarrhea once. She's had a decreased appetite. States she feels lightheaded like she'll pass out now when she stands up. No headache. No confusion. She has mild abdominal pain. No swelling or legs. No localizing numbness or weakness. No vision changes. States she has been having urinary frequency.  Past Medical History  Diagnosis Date  . Hypertension   . Diabetes mellitus   . Hyperlipemia    Past Surgical History  Procedure Laterality Date  . Appendectomy    . Tonsillectomy    . Tubal ligation     No family history on file. Social History  Substance Use Topics  . Smoking status: Former Smoker -- 4 years    Types: Cigars    Quit date: 01/19/1984  . Smokeless tobacco: None  . Alcohol Use: No   OB History    No data available     Review of Systems  Constitutional: Positive for appetite change and fatigue. Negative for activity change.  Eyes: Negative for pain.  Respiratory: Negative for chest tightness and shortness of breath.   Cardiovascular: Negative for chest pain and leg swelling.  Gastrointestinal: Positive for nausea and vomiting. Negative for abdominal pain and diarrhea.   Endocrine: Positive for polyphagia and polyuria.  Genitourinary: Negative for flank pain.  Musculoskeletal: Negative for back pain and neck stiffness.  Skin: Negative for rash.  Neurological: Positive for light-headedness. Negative for weakness, numbness and headaches.  Psychiatric/Behavioral: Negative for behavioral problems.      Allergies  Review of patient's allergies indicates no known allergies.  Home Medications   Prior to Admission medications   Medication Sig Start Date End Date Taking? Authorizing Provider  amLODipine (NORVASC) 5 MG tablet Take 5 mg by mouth daily.    Historical Provider, MD  amLODipine-benazepril (LOTREL) 10-20 MG per capsule Take 1 capsule by mouth daily.    Historical Provider, MD  carvedilol (COREG) 6.25 MG tablet Take 6.25 mg by mouth 2 (two) times daily with a meal.    Historical Provider, MD  Insulin Aspart Prot & Aspart (NOVOLOG MIX 70/30 [place]) Inject 15-20 Units into the skin 3 (three) times daily.    Historical Provider, MD  insulin detemir (LEVEMIR) 100 UNIT/ML injection Inject 40 Units into the skin at bedtime.     Historical Provider, MD  lisinopril-hydrochlorothiazide (PRINZIDE,ZESTORETIC) 20-12.5 MG per tablet Take 1 tablet by mouth daily.    Historical Provider, MD  metFORMIN (GLUCOPHAGE) 500 MG tablet Take 1,000 mg by mouth 2 (two) times daily with a meal.     Historical Provider, MD  metFORMIN (GLUCOPHAGE) 500 MG tablet Take 1 tablet (500 mg total) by mouth 2 (two) times daily with a  meal. 07/16/12   Mylinda Latina, MD  rosuvastatin (CRESTOR) 20 MG tablet Take 20 mg by mouth daily.    Historical Provider, MD   BP 197/98 mmHg  Pulse 89  Temp(Src) 98.2 F (36.8 C) (Oral)  Resp 18  Ht 5\' 2"  (1.575 m)  Wt 175 lb (79.379 kg)  BMI 32.00 kg/m2  SpO2 100% Physical Exam  Constitutional: She appears well-developed.  HENT:  Head: Atraumatic.  Eyes: EOM are normal. Pupils are equal, round, and reactive to light.  Neck: Neck supple.   Cardiovascular: Normal rate.   Pulmonary/Chest: Effort normal.  Abdominal: Soft. There is no tenderness.  Musculoskeletal: Normal range of motion. She exhibits no edema.  Neurological: She is alert.  Finger-nose intact.  Skin: Skin is warm.    ED Course  Procedures (including critical care time) Labs Review Labs Reviewed  COMPREHENSIVE METABOLIC PANEL - Abnormal; Notable for the following:    Chloride 100 (*)    Glucose, Bld 428 (*)    Albumin 3.3 (*)    ALT 11 (*)    GFR calc non Af Amer 58 (*)    All other components within normal limits  CBC WITH DIFFERENTIAL/PLATELET - Abnormal; Notable for the following:    WBC 11.4 (*)    Neutro Abs 9.0 (*)    All other components within normal limits  CBG MONITORING, ED - Abnormal; Notable for the following:    Glucose-Capillary 389 (*)    All other components within normal limits  LIPASE, BLOOD  URINALYSIS, ROUTINE W REFLEX MICROSCOPIC (NOT AT [facility])    Imaging Review No results found. I have personally reviewed and evaluated these images and lab results as part of my medical decision-making.   EKG Interpretation   Date/Time:  Wednesday July 11 2015 14:33:59 EDT Ventricular Rate:  76 PR Interval:  132 QRS Duration: 80 QT Interval:  366 QTC Calculation: 411 R Axis:   19 Text Interpretation:  Normal sinus rhythm Nonspecific T wave abnormality  Abnormal ECG Confirmed by Alvino Chapel  MD, Ovid Curd [phone]) on 07/11/2015  2:39:18 PM      MDM   Final diagnoses:  Hyperglycemia  Essential hypertension  Noncompliance with medications    Patient with hyperglycemia and medication noncompliance. Also hypertension. EKG reassuring. No chest pain. Will give insulin to help lower the sugar. Likely will just need to be started back on her medications. Nonfocal exam otherwise.    Davonna Belling, MD 07/11/15 1512  Urinalysis still pending. Patient has been given prescriptions for her home medications.  Davonna Belling,  MD 07/11/15 [phone]

## 2015-07-11 NOTE — ED Notes (Signed)
CBG 319

## 2015-07-13 ENCOUNTER — Encounter (HOSPITAL_BASED_OUTPATIENT_CLINIC_OR_DEPARTMENT_OTHER): Payer: Self-pay

## 2015-07-13 ENCOUNTER — Emergency Department (HOSPITAL_BASED_OUTPATIENT_CLINIC_OR_DEPARTMENT_OTHER): Payer: Medicare Other

## 2015-07-13 ENCOUNTER — Inpatient Hospital Stay (HOSPITAL_BASED_OUTPATIENT_CLINIC_OR_DEPARTMENT_OTHER)
Admission: EM | Admit: 2015-07-13 | Discharge: 2015-07-15 | DRG: 690 | Disposition: A | Discharge status: Home Health | Payer: Medicare Other | Attending: Family Medicine | Admitting: Family Medicine

## 2015-07-13 DIAGNOSIS — I10 Essential (primary) hypertension: Secondary | ICD-10-CM | POA: Diagnosis present

## 2015-07-13 DIAGNOSIS — Z87891 Personal history of nicotine dependence: Secondary | ICD-10-CM

## 2015-07-13 DIAGNOSIS — Z79899 Other long term (current) drug therapy: Secondary | ICD-10-CM | POA: Diagnosis not present

## 2015-07-13 DIAGNOSIS — E11311 Type 2 diabetes mellitus with unspecified diabetic retinopathy with macular edema: Secondary | ICD-10-CM | POA: Diagnosis present

## 2015-07-13 DIAGNOSIS — R42 Dizziness and giddiness: Secondary | ICD-10-CM | POA: Diagnosis present

## 2015-07-13 DIAGNOSIS — Z833 Family history of diabetes mellitus: Secondary | ICD-10-CM

## 2015-07-13 DIAGNOSIS — R51 Headache: Secondary | ICD-10-CM | POA: Diagnosis present

## 2015-07-13 DIAGNOSIS — E785 Hyperlipidemia, unspecified: Secondary | ICD-10-CM | POA: Diagnosis present

## 2015-07-13 DIAGNOSIS — E1140 Type 2 diabetes mellitus with diabetic neuropathy, unspecified: Secondary | ICD-10-CM | POA: Diagnosis present

## 2015-07-13 DIAGNOSIS — R1110 Vomiting, unspecified: Secondary | ICD-10-CM | POA: Diagnosis present

## 2015-07-13 DIAGNOSIS — N390 Urinary tract infection, site not specified: Principal | ICD-10-CM | POA: Diagnosis present

## 2015-07-13 DIAGNOSIS — R112 Nausea with vomiting, unspecified: Secondary | ICD-10-CM

## 2015-07-13 DIAGNOSIS — E119 Type 2 diabetes mellitus without complications: Secondary | ICD-10-CM

## 2015-07-13 DIAGNOSIS — Z8249 Family history of ischemic heart disease and other diseases of the circulatory system: Secondary | ICD-10-CM

## 2015-07-13 DIAGNOSIS — L089 Local infection of the skin and subcutaneous tissue, unspecified: Secondary | ICD-10-CM

## 2015-07-13 DIAGNOSIS — Z794 Long term (current) use of insulin: Secondary | ICD-10-CM | POA: Diagnosis not present

## 2015-07-13 LAB — URINE CULTURE: Culture: >=100000

## 2015-07-13 LAB — URINALYSIS, ROUTINE W REFLEX MICROSCOPIC
Bilirubin Urine: NEGATIVE
Glucose, UA: 100 mg/dL — AB
Hgb urine dipstick: NEGATIVE
Ketones, ur: 15 mg/dL — AB
Nitrite: NEGATIVE
Protein, ur: >300 mg/dL — AB
Specific Gravity, Urine: 1.022 (ref 1.005–1.030)
Urobilinogen, UA: 1 mg/dL (ref 0.0–1.0)
pH: 8 (ref 5.0–8.0)

## 2015-07-13 LAB — URINE MICROSCOPIC-ADD ON

## 2015-07-13 LAB — COMPREHENSIVE METABOLIC PANEL
ALT: 12 U/L — ABNORMAL LOW (ref 14–54)
AST: 18 U/L (ref 15–41)
Albumin: 3.7 g/dL (ref 3.5–5.0)
Alkaline Phosphatase: 73 U/L (ref 38–126)
Anion gap: 8 (ref 5–15)
BUN: 17 mg/dL (ref 6–20)
CO2: 29 mmol/L (ref 22–32)
Calcium: 9.4 mg/dL (ref 8.9–10.3)
Chloride: 102 mmol/L (ref 101–111)
Creatinine, Ser: 1.02 mg/dL — ABNORMAL HIGH (ref 0.44–1.00)
GFR calc Af Amer: >60 mL/min (ref >60)
GFR calc non Af Amer: 56 mL/min — ABNORMAL LOW (ref >60)
Glucose, Bld: 122 mg/dL — ABNORMAL HIGH (ref 65–99)
Potassium: 3.5 mmol/L (ref 3.5–5.1)
Sodium: 139 mmol/L (ref 135–145)
Total Bilirubin: 0.5 mg/dL (ref 0.3–1.2)
Total Protein: 7.3 g/dL (ref 6.5–8.1)

## 2015-07-13 LAB — GLUCOSE, CAPILLARY
Glucose-Capillary: 128 mg/dL — ABNORMAL HIGH (ref 65–99)
Glucose-Capillary: 52 mg/dL — ABNORMAL LOW (ref 65–99)

## 2015-07-13 LAB — CBC
HCT: 41.2 % (ref 36.0–46.0)
Hemoglobin: 13.7 g/dL (ref 12.0–15.0)
MCH: 28.4 pg (ref 26.0–34.0)
MCHC: 33.3 g/dL (ref 30.0–36.0)
MCV: 85.3 fL (ref 78.0–100.0)
Platelets: 336 10*3/uL (ref 150–400)
RBC: 4.83 MIL/uL (ref 3.87–5.11)
RDW: 12.4 % (ref 11.5–15.5)
WBC: 7.8 10*3/uL (ref 4.0–10.5)

## 2015-07-13 LAB — TROPONIN I: Troponin I: <0.03 ng/mL (ref <0.031)

## 2015-07-13 LAB — CBG MONITORING, ED: Glucose-Capillary: 114 mg/dL — ABNORMAL HIGH (ref 65–99)

## 2015-07-13 LAB — PROTIME-INR
INR: 0.98 (ref 0.00–1.49)
Prothrombin Time: 13.2 seconds (ref 11.6–15.2)

## 2015-07-13 MED ORDER — ROSUVASTATIN CALCIUM 20 MG PO TABS
20.0000 mg | ORAL_TABLET | Freq: Every day | ORAL | Status: DC
  Administered 2015-07-13 – 2015-07-15 (×3): 20 mg via ORAL
  Filled 2015-07-13 (×3): qty 1

## 2015-07-13 MED ORDER — CEFTRIAXONE SODIUM 1 G IJ SOLR
INTRAMUSCULAR | Status: AC
  Filled 2015-07-13: qty 10

## 2015-07-13 MED ORDER — ONDANSETRON HCL 4 MG/2ML IJ SOLN
INTRAMUSCULAR | Status: AC
  Filled 2015-07-13: qty 2

## 2015-07-13 MED ORDER — METOCLOPRAMIDE HCL 5 MG/ML IJ SOLN
10.0000 mg | Freq: Once | INTRAMUSCULAR | Status: AC
  Administered 2015-07-13: 10 mg via INTRAVENOUS
  Filled 2015-07-13: qty 2

## 2015-07-13 MED ORDER — DEXTROSE 50 % IV SOLN
25.0000 mL | Freq: Once | INTRAVENOUS | Status: AC
  Administered 2015-07-13: 25 mL via INTRAVENOUS

## 2015-07-13 MED ORDER — ALUM & MAG HYDROXIDE-SIMETH 200-200-20 MG/5ML PO SUSP: 30.0000 mL | Freq: Four times a day (QID) | ORAL | Status: DC | PRN

## 2015-07-13 MED ORDER — ONDANSETRON HCL 4 MG/2ML IJ SOLN
4.0000 mg | Freq: Once | INTRAMUSCULAR | Status: AC
  Administered 2015-07-13: 4 mg via INTRAVENOUS

## 2015-07-13 MED ORDER — INSULIN DETEMIR 100 UNIT/ML ~~LOC~~ SOLN
30.0000 [IU] | Freq: Every day | SUBCUTANEOUS | Status: DC
  Administered 2015-07-14: 30 [IU] via SUBCUTANEOUS
  Filled 2015-07-13 (×3): qty 0.3

## 2015-07-13 MED ORDER — ACETAMINOPHEN 325 MG PO TABS
650.0000 mg | ORAL_TABLET | Freq: Four times a day (QID) | ORAL | Status: DC | PRN
  Administered 2015-07-13: 650 mg via ORAL
  Filled 2015-07-13: qty 2

## 2015-07-13 MED ORDER — DEXTROSE 50 % IV SOLN
INTRAVENOUS | Status: AC
  Filled 2015-07-13: qty 50

## 2015-07-13 MED ORDER — MECLIZINE HCL 12.5 MG PO TABS
12.5000 mg | ORAL_TABLET | Freq: Two times a day (BID) | ORAL | Status: DC | PRN
  Filled 2015-07-13: qty 1

## 2015-07-13 MED ORDER — AMLODIPINE BESYLATE 5 MG PO TABS
5.0000 mg | ORAL_TABLET | Freq: Every day | ORAL | Status: DC
  Administered 2015-07-13 – 2015-07-15 (×3): 5 mg via ORAL
  Filled 2015-07-13 (×3): qty 1

## 2015-07-13 MED ORDER — ACETAMINOPHEN 650 MG RE SUPP: 650.0000 mg | Freq: Four times a day (QID) | RECTAL | Status: DC | PRN

## 2015-07-13 MED ORDER — DEXTROSE 5 % IV SOLN
1.0000 g | Freq: Once | INTRAVENOUS | Status: AC
  Administered 2015-07-13: 1 g via INTRAVENOUS

## 2015-07-13 MED ORDER — SODIUM CHLORIDE 0.9 % IV SOLN
INTRAVENOUS | Status: DC
  Administered 2015-07-13 – 2015-07-14 (×2): via INTRAVENOUS

## 2015-07-13 MED ORDER — CARVEDILOL 6.25 MG PO TABS
6.2500 mg | ORAL_TABLET | Freq: Two times a day (BID) | ORAL | Status: DC
  Administered 2015-07-14 – 2015-07-15 (×3): 6.25 mg via ORAL
  Filled 2015-07-13 (×3): qty 1

## 2015-07-13 MED ORDER — SODIUM CHLORIDE 0.9 % IV BOLUS (SEPSIS)
1000.0000 mL | Freq: Once | INTRAVENOUS | Status: AC
  Administered 2015-07-13: 1000 mL via INTRAVENOUS

## 2015-07-13 MED ORDER — DIPHENHYDRAMINE HCL 50 MG/ML IJ SOLN
25.0000 mg | Freq: Once | INTRAMUSCULAR | Status: AC
  Administered 2015-07-13: 16:00:00 via INTRAVENOUS
  Filled 2015-07-13: qty 1

## 2015-07-13 MED ORDER — ONDANSETRON HCL 4 MG/2ML IJ SOLN: 4.0000 mg | Freq: Three times a day (TID) | INTRAMUSCULAR | Status: DC | PRN

## 2015-07-13 MED ORDER — LISINOPRIL 20 MG PO TABS
20.0000 mg | ORAL_TABLET | Freq: Every day | ORAL | Status: DC
  Administered 2015-07-13 – 2015-07-15 (×3): 20 mg via ORAL
  Filled 2015-07-13 (×3): qty 1

## 2015-07-13 MED ORDER — DEXTROSE 5 % IV SOLN
1.0000 g | INTRAVENOUS | Status: DC
  Administered 2015-07-14: 1 g via INTRAVENOUS
  Filled 2015-07-13 (×2): qty 10

## 2015-07-13 MED ORDER — HEPARIN SODIUM (PORCINE) 5000 UNIT/ML IJ SOLN
5000.0000 [IU] | Freq: Three times a day (TID) | INTRAMUSCULAR | Status: DC
  Administered 2015-07-13 – 2015-07-15 (×5): 5000 [IU] via SUBCUTANEOUS
  Filled 2015-07-13 (×4): qty 1

## 2015-07-13 NOTE — ED Provider Notes (Signed)
CSN: [id number]  Arrival date & time 07/13/15  1323 History   First MD Initiated Contact with Patient 07/13/15 1501     Chief Complaint  Patient presents with  . Emesis     (Consider location/radiation/quality/duration/timing/severity/associated sxs/prior Treatment) HPI Comments: Has had vomiting for the past few days. Seen here for similar, vomiting was controlled, diagnosed with a UTI, given antibiotics. She is currently not taking her medicines except for her levemir. Sugars in the 100s today. No diarrhea, fever, SOB, cough. No abdominal pain, urinary symptoms.   Patient is a 64 y.o. female presenting with vomiting. The history is provided by the patient.  Emesis Severity:  Moderate Duration:  3 days Timing:  Constant Quality:  Stomach contents How soon after eating does vomiting occur:  30 minutes Progression:  Worsening Chronicity:  New Recent urination:  Normal Relieved by:  Nothing Worsened by:  Nothing tried Associated symptoms: no abdominal pain, no diarrhea and no fever     Past Medical History  Diagnosis Date  . Hypertension   . Diabetes mellitus   . Hyperlipemia    Past Surgical History  Procedure Laterality Date  . Appendectomy    . Tonsillectomy    . Tubal ligation     No family history on file. Social History  Substance Use Topics  . Smoking status: Former Smoker -- 4 years    Types: Cigars    Quit date: 01/19/1984  . Smokeless tobacco: None  . Alcohol Use: No   OB History    No data available     Review of Systems  Constitutional: Negative for fever.  Respiratory: Negative for cough and shortness of breath.   Gastrointestinal: Positive for vomiting. Negative for abdominal pain and diarrhea.  Genitourinary: Positive for urgency (a few days ago).  All other systems reviewed and are negative.     Allergies  Review of patient's allergies indicates no known allergies.  Home Medications   Prior to Admission medications   Medication Sig  Start Date End Date Taking? Authorizing Provider  amLODipine (NORVASC) 5 MG tablet Take 1 tablet (5 mg total) by mouth daily. 07/11/15   Davonna Belling, MD  amLODipine-benazepril (LOTREL) 10-20 MG per capsule Take 1 capsule by mouth daily.    Historical Provider, MD  carvedilol (COREG) 6.25 MG tablet Take 1 tablet (6.25 mg total) by mouth 2 (two) times daily with a meal. 07/11/15   Davonna Belling, MD  insulin aspart protamine- aspart (NOVOLOG MIX 70/30) (70-30) 100 UNIT/ML injection Inject 0.15-0.2 mLs (15-20 Units total) into the skin 3 (three) times daily. 07/11/15   Davonna Belling, MD  insulin detemir (LEVEMIR) 100 UNIT/ML injection Inject 0.4 mLs (40 Units total) into the skin at bedtime. 07/11/15   Davonna Belling, MD  lisinopril-hydrochlorothiazide (PRINZIDE,ZESTORETIC) 20-12.5 MG per tablet Take 1 tablet by mouth daily. 07/11/15   Davonna Belling, MD  metFORMIN (GLUCOPHAGE) 500 MG tablet Take 2 tablets (1,000 mg total) by mouth 2 (two) times daily with a meal. 07/11/15   Davonna Belling, MD  nitrofurantoin, macrocrystal-monohydrate, (MACROBID) 100 MG capsule Take 1 capsule (100 mg total) by mouth 2 (two) times daily. 07/11/15   Charlesetta Shanks, MD  ondansetron (ZOFRAN-ODT) 4 MG disintegrating tablet Take 1 tablet (4 mg total) by mouth every 8 (eight) hours as needed for nausea or vomiting. 07/11/15   Davonna Belling, MD  rosuvastatin (CRESTOR) 20 MG tablet Take 1 tablet (20 mg total) by mouth daily. 07/11/15   Davonna Belling, MD   BP 171/84 mmHg  Pulse 75  Temp(Src) 97.8 F (36.6 C) (Oral)  Resp 20  Ht 5\' 2"  (1.575 m)  Wt 175 lb (79.379 kg)  BMI 32.00 kg/m2  SpO2 100% Physical Exam  Constitutional: She is oriented to person, place, and time. She appears well-developed and well-nourished. No distress.  HENT:  Head: Normocephalic and atraumatic.  Mouth/Throat: Oropharynx is clear and moist.  Eyes: EOM are normal. Pupils are equal, round, and reactive to light.  Neck: Normal range of  motion. Neck supple.  Cardiovascular: Normal rate and regular rhythm.  Exam reveals no friction rub.   No murmur heard. Pulmonary/Chest: Effort normal and breath sounds normal. No respiratory distress. She has no wheezes. She has no rales.  Abdominal: Soft. She exhibits no distension. There is no tenderness. There is no rebound.  Musculoskeletal: Normal range of motion. She exhibits no edema.  Neurological: She is alert and oriented to person, place, and time.  Skin: No rash noted. She is not diaphoretic.  Nursing note and vitals reviewed.   ED Course  Procedures (including critical care time) Labs Review Labs Reviewed  COMPREHENSIVE METABOLIC PANEL - Abnormal; Notable for the following:    Glucose, Bld 122 (*)    Creatinine, Ser 1.02 (*)    ALT 12 (*)    GFR calc non Af Amer 56 (*)    All other components within normal limits  CBG MONITORING, ED - Abnormal; Notable for the following:    Glucose-Capillary 114 (*)    All other components within normal limits  CBC  URINALYSIS, ROUTINE W REFLEX MICROSCOPIC (NOT AT [facility])  TROPONIN I    Imaging Review Ct Head Wo Contrast  07/13/2015   CLINICAL DATA:  Headache, dizziness.  EXAM: CT HEAD WITHOUT CONTRAST  TECHNIQUE: Contiguous axial images were obtained from the base of the skull through the vertex without intravenous contrast.  COMPARISON:  07/16/2012  FINDINGS: No acute cortical infarct, hemorrhage, or mass lesion ispresent. Ventricles are of normal size. No significant extra-axial fluid collection is present. The paranasal sinuses andmastoid air cells are clear. The osseous skull is intact.  IMPRESSION: Negative exam.   Electronically Signed   By: Kerby Moors M.D.   On: 07/13/2015 16:29   I have personally reviewed and evaluated these images and lab results as part of my medical decision-making.   EKG Interpretation   Date/Time:  Friday July 13 2015 16:12:23 EDT Ventricular Rate:  71 PR Interval:  126 QRS Duration: 78 QT  Interval:  370 QTC Calculation: 402 R Axis:   5 Text Interpretation:  Normal sinus rhythm T wave abnormality, consider  lateral ischemia Abnormal ECG Similar to prior from 2 days ago with  lateral T wave inversion and T wave flattening Confirmed by Mingo Amber  MD,  Jailin Manocchio ([id number]) on 07/13/2015 4:20:15 PM      MDM   Final diagnoses:  UTI (lower urinary tract infection)    64 year old female here with vomiting. Vomiting for the past few days despite ODT zofran.  Urine culture grew out Group B strep, no sensitivities have resulted. Denies any other symptoms like chest discomfort, diarrhea, fever, cough, abdominal pain. Exam benign, no abdominal tenderness. Will repeat UA, labs. Will check EKG and troponin in case this is ACS. She states a gradual onset headache that started this morning after vomiting, will CT her head.  Labs ok, urine still shows a UTI. With her vomiting here and unable to tolerate PO meds at home despite ODT zofran, will admit. Transferred to [facility]  Ma Rings, MD 07/13/15 [phone]

## 2015-07-13 NOTE — ED Notes (Signed)
Pt states she was seen here Tuesday for vomiting and feels no better-pt NAD with steady gait into triage-began actively vomiting upon arrival to triage

## 2015-07-13 NOTE — H&P (Signed)
[facility] History and Physical  Melissa Howe [id number] DOB: 07/12/1951 DOA: 07/13/2015  Referring physician: ED physician PCP: No PCP Per Patient  Specialists:   Chief Complaint:  dysuria, nausea, vomiting, dizziness  HPI: Melissa Howe is a 64 y.o. female with PMH of hypertension, hyperlipidemia, diabetes mellitus, who presents with nausea, vomiting, dysuria, dizziness.  Patient reports that he started having nausea, vomiting 2 days ago. She was evaluated in the emergency room on 9/14. She had urine culture which was positive for group B streptococcus (strep agalactiae). No sensitivity was obtained. She was put on antibiotics (patient does not remember the name of antibiotics, but nitrofurantoin was on her medication list). She reports that she is taking the antibiotics, but still has nausea and vomiting, but no abdominal pain or diarrhea. She also took Zofran without significant help for nausea. She states that initially she did not have dysuria, but she has dysuria, burning and increase of frequency currently. No flank pain.   Patient also reports having dizziness, poor balance and unsteady gait in the past 2 weeks. She feels that the room is spinning around her, no ear ringing or vision change. No unilateral weakness. Patient does not have chest pain, cough, shortness breath or rashes.  In ED, patient was found to have positive urinalysis with small amount for leukocytes, WBC 7.8, negative troponin, temperature normal, no tachycardia, electrolytes okay. Negative CT head for acute abnormalities. Patient is admitted to inpatient for further eval and treatment.  Where does patient live?   At home   Can patient participate in ADLs?   Some   Review of Systems:   General: no fevers, chills, no changes in body weight, has poor appetite, has fatigue HEENT: no blurry vision, hearing changes or sore throat Pulm: no dyspnea, coughing, wheezing CV: no chest pain, palpitations Abd: has  nausea, vomiting, no abdominal pain, diarrhea, constipation GU: has dysuria, burning on urination, increased urinary frequency, no hematuria  Ext: no leg edema Neuro: no unilateral weakness, numbness, or tingling, no vision change or hearing loss. Has dizziness and poor balance. Skin: no rash MSK: No muscle spasm, no deformity, no limitation of range of movement in spin Heme: No easy bruising.  Travel history: No recent long distant travel.  Allergy: No Known Allergies  Past Medical History  Diagnosis Date  . Hypertension   . Diabetes mellitus   . Hyperlipemia     Past Surgical History  Procedure Laterality Date  . Appendectomy    . Tonsillectomy    . Tubal ligation      Social History:  reports that she quit smoking about 31 years ago. Her smoking use included Cigars. She does not have any smokeless tobacco history on file. She reports that she does not drink alcohol or use illicit drugs.  Family History:  Family History  Problem Relation Age of Onset  . Hypertension Mother   . Diabetes Mother   . Hypertension Sister      Prior to Admission medications   Medication Sig Start Date End Date Taking? Authorizing Provider  amLODipine (NORVASC) 5 MG tablet Take 1 tablet (5 mg total) by mouth daily. 07/11/15   Davonna Belling, MD  amLODipine-benazepril (LOTREL) 10-20 MG per capsule Take 1 capsule by mouth daily.    Historical Provider, MD  carvedilol (COREG) 6.25 MG tablet Take 1 tablet (6.25 mg total) by mouth 2 (two) times daily with a meal. 07/11/15   Davonna Belling, MD  insulin aspart protamine- aspart (NOVOLOG MIX 70/30) (70-30)  100 UNIT/ML injection Inject 0.15-0.2 mLs (15-20 Units total) into the skin 3 (three) times daily. 07/11/15   Davonna Belling, MD  insulin detemir (LEVEMIR) 100 UNIT/ML injection Inject 0.4 mLs (40 Units total) into the skin at bedtime. 07/11/15   Davonna Belling, MD  lisinopril-hydrochlorothiazide (PRINZIDE,ZESTORETIC) 20-12.5 MG per tablet Take 1  tablet by mouth daily. 07/11/15   Davonna Belling, MD  metFORMIN (GLUCOPHAGE) 500 MG tablet Take 2 tablets (1,000 mg total) by mouth 2 (two) times daily with a meal. 07/11/15   Davonna Belling, MD  nitrofurantoin, macrocrystal-monohydrate, (MACROBID) 100 MG capsule Take 1 capsule (100 mg total) by mouth 2 (two) times daily. 07/11/15   Charlesetta Shanks, MD  ondansetron (ZOFRAN-ODT) 4 MG disintegrating tablet Take 1 tablet (4 mg total) by mouth every 8 (eight) hours as needed for nausea or vomiting. 07/11/15   Davonna Belling, MD  rosuvastatin (CRESTOR) 20 MG tablet Take 1 tablet (20 mg total) by mouth daily. 07/11/15   Davonna Belling, MD    Physical Exam: Filed Vitals:   07/13/15 1554 07/13/15 1621 07/13/15 1822 07/13/15 1933  BP:  178/74 172/84 167/76  Pulse: 81 72 77 78  Temp:    98.2 F (36.8 C)  TempSrc:    Oral  Resp: 18   17  Height:    5' 2.5" (1.588 m)  Weight:    78.3 kg (172 lb 9.9 oz)  SpO2: 100% 99% 96% 97%   General: Not in acute distress HEENT:       Eyes: PERRL, EOMI, no scleral icterus.       ENT: No discharge from the ears and nose, no pharynx injection, no tonsillar enlargement.        Neck: No JVD, no bruit, no mass felt. Heme: No neck lymph node enlargement. Cardiac: S1/S2, RRR, No murmurs, No gallops or rubs. Pulm: No rales, wheezing, rhonchi or rubs. Abd: Soft, nondistended, nontender, no rebound pain, no organomegaly, BS present. Ext: No pitting leg edema bilaterally. 2+DP/PT pulse bilaterally. Musculoskeletal: No joint deformities, No joint redness or warmth, no limitation of ROM in spin. Skin: No rashes.  Neuro: Alert, oriented X3, cranial nerves II-XII grossly intact, muscle strength 5/5 in all extremities, sensation to light touch intact. Brachial reflex 2+ bilaterally. Knee reflex 1+ bilaterally. Negative Babinski's sign. Normal finger to nose test. Psych: Patient is not psychotic, no suicidal or hemocidal ideation.  Labs on Admission:  Basic Metabolic  Panel:  Recent Labs Lab 07/11/15 1415 07/13/15 1409  NA 138 139  K 4.5 3.5  CL 100* 102  CO2 29 29  GLUCOSE 428* 122*  BUN 17 17  CREATININE 1.00 1.02*  CALCIUM 9.4 9.4   Liver Function Tests:  Recent Labs Lab 07/11/15 1415 07/13/15 1409  AST 17 18  ALT 11* 12*  ALKPHOS 72 73  BILITOT 0.7 0.5  PROT 6.9 7.3  ALBUMIN 3.3* 3.7    Recent Labs Lab 07/11/15 1415  LIPASE 25   No results for input(s): AMMONIA in the last 168 hours. CBC:  Recent Labs Lab 07/11/15 1415 07/13/15 1409  WBC 11.4* 7.8  NEUTROABS 9.0*  --   HGB 12.7 13.7  HCT 38.7 41.2  MCV 85.2 85.3  PLT 301 336   Cardiac Enzymes:  Recent Labs Lab 07/13/15 1409  TROPONINI <0.03    BNP (last 3 results) No results for input(s): BNP in the last 8760 hours.  ProBNP (last 3 results) No results for input(s): PROBNP in the last 8760 hours.  CBG:  Recent Labs Lab 07/11/15 1349 07/11/15 1549 07/13/15 1416 07/13/15 2149 07/13/15 2228  GLUCAP 389* 319* 114* 52* 128*    Radiological Exams on Admission: Ct Head Wo Contrast  07/13/2015   CLINICAL DATA:  Headache, dizziness.  EXAM: CT HEAD WITHOUT CONTRAST  TECHNIQUE: Contiguous axial images were obtained from the base of the skull through the vertex without intravenous contrast.  COMPARISON:  07/16/2012  FINDINGS: No acute cortical infarct, hemorrhage, or mass lesion ispresent. Ventricles are of normal size. No significant extra-axial fluid collection is present. The paranasal sinuses andmastoid air cells are clear. The osseous skull is intact.  IMPRESSION: Negative exam.   Electronically Signed   By: Kerby Moors M.D.   On: 07/13/2015 16:29   Mr Brain Wo Contrast  07/14/2015   CLINICAL DATA:  History of diabetes, hypertension. Vomiting for few days with gradual onset headache this morning  EXAM: MRI HEAD WITHOUT CONTRAST  TECHNIQUE: Multiplanar, multiecho pulse sequences of the brain and surrounding structures were obtained without intravenous  contrast.  COMPARISON:  CT head July 13, 2015  FINDINGS: The ventricles and sulci are normal for patient's age. No abnormal parenchymal signal, mass lesions, mass effect. No reduced diffusion to suggest acute ischemia. Patchy supratentorial white matter T2 hyperintensities compatible with chronic small vessel ischemic disease, within normal range for patient's age. No susceptibility artifact to suggest hemorrhage.  No abnormal extra-axial fluid collections. No extra-axial masses though, contrast enhanced sequences would be more sensitive. Normal major intracranial vascular flow voids seen at the skull base.  Ocular globes and orbital contents are unremarkable though not tailored for evaluation. No abnormal sellar expansion. Visualized paranasal sinuses and mastoid air cells are well-aerated. No suspicious calvarial bone marrow signal. No abnormal sellar expansion ; 2 mm probable pars in intermedius cyst. Craniocervical junction maintained. Patient is edentulous.  IMPRESSION: Negative noncontrast MRI of the brain for age.   Electronically Signed   By: Elon Alas M.D.   On: 07/14/2015 04:00    EKG: Independently reviewed.  Abnormal findings:  Mild T-wave inversion in lateral leads, which existed in previous EKG on 07/11/15.  Assessment/Plan Principal Problem:   UTI (lower urinary tract infection) Active Problems:   Diabetic neuropathy   Diabetic macular edema   Essential hypertension   Nausea with vomiting   Dizziness   Diabetes mellitus without complication  UTI (lower urinary tract infection): patient has symptoms for UTI and positive urinalysis, consistent with UTI, no flank pain to indicating pyelonephritis. Patient is not septic. Hemodynamically stable. Pt failed oral antibiotics treatment at home.  -will admit to tele bed for IV Abx -start rocephin - Follow up results of urine and blood cx and amend antibiotic regimen if needed per sensitivity results - prn IV Zofran for nausea -  IVF: 2L of NS bolus in ED, followed by 75 cc/h   Dizziness: etiology is not clear. Differential diagnoses include orthostatic vital status secondary to dehydration, posterior stroke, vestibule system abnormalities. 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 Patient is taking NovoLog, metformin and Levemir at home -will decrease Lantus dose from 40 units to 30 units -SSI -Check A1c  HTN: -switch prinzide to lisinopril -continue amlodipine and coreg  HLD: Last LDL was 82 on 11/28/08 -Continue home medications: Crestor   DVT ppx: SQ Heparin   Code Status: Full code Family Communication: None at bed side.   Disposition Plan: Admit to inpatient  Date of Service 07/14/2015    Ivor Costa [facility] Pager [phone]  If 7PM-7AM, please contact night-coverage [email or web address] Password [email or web address] 07/14/2015, 4:52 AM

## 2015-07-13 NOTE — ED Notes (Signed)
Attempt to call report nurse unavailable

## 2015-07-13 NOTE — ED Notes (Signed)
MD at bedside. 

## 2015-07-14 ENCOUNTER — Encounter (HOSPITAL_COMMUNITY): Payer: Self-pay | Admitting: Internal Medicine

## 2015-07-14 ENCOUNTER — Inpatient Hospital Stay (HOSPITAL_COMMUNITY): Payer: Medicare Other

## 2015-07-14 DIAGNOSIS — E119 Type 2 diabetes mellitus without complications: Secondary | ICD-10-CM

## 2015-07-14 DIAGNOSIS — L089 Local infection of the skin and subcutaneous tissue, unspecified: Secondary | ICD-10-CM

## 2015-07-14 DIAGNOSIS — N390 Urinary tract infection, site not specified: Principal | ICD-10-CM

## 2015-07-14 LAB — BASIC METABOLIC PANEL
Anion gap: 6 (ref 5–15)
BUN: 12 mg/dL (ref 6–20)
CO2: 26 mmol/L (ref 22–32)
Calcium: 8.5 mg/dL — ABNORMAL LOW (ref 8.9–10.3)
Chloride: 108 mmol/L (ref 101–111)
Creatinine, Ser: 0.96 mg/dL (ref 0.44–1.00)
GFR calc Af Amer: >60 mL/min (ref >60)
GFR calc non Af Amer: >60 mL/min (ref >60)
Glucose, Bld: 85 mg/dL (ref 65–99)
Potassium: 3.3 mmol/L — ABNORMAL LOW (ref 3.5–5.1)
Sodium: 140 mmol/L (ref 135–145)

## 2015-07-14 LAB — GLUCOSE, CAPILLARY
Glucose-Capillary: 90 mg/dL (ref 65–99)
Glucose-Capillary: 140 mg/dL — ABNORMAL HIGH (ref 65–99)
Glucose-Capillary: 157 mg/dL — ABNORMAL HIGH (ref 65–99)
Glucose-Capillary: 253 mg/dL — ABNORMAL HIGH (ref 65–99)

## 2015-07-14 LAB — CBC
HCT: 35.4 % — ABNORMAL LOW (ref 36.0–46.0)
Hemoglobin: 11.5 g/dL — ABNORMAL LOW (ref 12.0–15.0)
MCH: 27.8 pg (ref 26.0–34.0)
MCHC: 32.5 g/dL (ref 30.0–36.0)
MCV: 85.7 fL (ref 78.0–100.0)
Platelets: 293 10*3/uL (ref 150–400)
RBC: 4.13 MIL/uL (ref 3.87–5.11)
RDW: 12.7 % (ref 11.5–15.5)
WBC: 7.4 10*3/uL (ref 4.0–10.5)

## 2015-07-14 MED ORDER — INSULIN ASPART 100 UNIT/ML ~~LOC~~ SOLN
0.0000 [IU] | Freq: Three times a day (TID) | SUBCUTANEOUS | Status: DC
  Administered 2015-07-14: 1 [IU] via SUBCUTANEOUS
  Administered 2015-07-14: 2 [IU] via SUBCUTANEOUS
  Administered 2015-07-15: 1 [IU] via SUBCUTANEOUS

## 2015-07-14 NOTE — Progress Notes (Signed)
Hypoglycemic Event  CBG:52  Treatment: D50 IV 25 mL  Symptoms: None  Follow-up CBG: Time:2220 CBG Result:128  Possible Reasons for Event: Inadequate meal intake  Comments/MD notified:    Melissa Howe, Melissa Howe  Remember to initiate Hypoglycemia Order Set & complete

## 2015-07-14 NOTE — Progress Notes (Signed)
PT here ambulating with pt in the hallway.

## 2015-07-14 NOTE — Evaluation (Signed)
Physical Therapy Evaluation Patient Details Name: Melissa Howe MRN: [id number] DOB: 1951-09-05 Today's Date: 07/14/2015   History of Present Illness  Melissa Howe is a 64 y.o. female with PMH of hypertension, hyperlipidemia, diabetes mellitus, who presents with nausea, vomiting, dysuria, dizziness.  Has been off balance for over a week per pt.  N/V as well.   Clinical Impression  Pt admitted with above diagnosis. Pt currently with functional limitations due to the deficits listed below (see PT Problem List). Pt should be able to go home with HHPT f/u for vestibular rehab.  May benefit from using RW initially since she is essentially alone during day.  Will follow acutely.   Pt will benefit from skilled PT to increase their independence and safety with mobility to allow discharge to the venue listed below.      Follow Up Recommendations Home health PT for vestibular rehab;Supervision - Intermittent    Equipment Recommendations  Rolling walker with 5" wheels    Recommendations for Other Services       Precautions / Restrictions Precautions Precautions: Fall Restrictions Weight Bearing Restrictions: No      Mobility  Bed Mobility Overal bed mobility: Independent                Transfers Overall transfer level: Independent                  Ambulation/Gait Ambulation/Gait assistance: Min guard Ambulation Distance (Feet): 150 Feet Assistive device: None Gait Pattern/deviations: Step-through pattern;Decreased stride length;Wide base of support   Gait velocity interpretation: Below normal speed for age/gender General Gait Details: Pt ambulated without device with fair balance overall with occasional guard assist.  No significant LOB noted.  Not challenged fully.    Stairs            Wheelchair Mobility    Modified Wheller (Stroke Patients Only) Modified Koranda (Stroke Patients Only) Pre-Morbid Kun Score: No symptoms Modified Stueve: Moderately severe  disability     Balance Overall balance assessment: Needs assistance         Standing balance support: Single extremity supported;During functional activity Standing balance-Leahy Scale: Fair Standing balance comment: can stand statically without UE support.               High level balance activites: Direction changes;Turns;Sudden stops High Level Balance Comments: min guard assist with above.             Pertinent Vitals/Pain Pain Assessment: No/denies pain  VSS    Home Living Family/patient expects to be discharged to:: Private residence Living Arrangements: Children (lives with daughter who works, daughter with Cherlyn Cushing there too) Available Help at Discharge: Family;Available PRN/intermittently (24 hour supervision only not assist) Type of Home: House Home Access: Stairs to enter   [employer] of Steps: 3 Home Layout: Two level;Bed/bath upstairs Home Equipment: None      Prior Function Level of Independence: Independent         Comments: Daughter works and other daughter stays with her with Downs syndrome.  She functions on her own and can provide supervision.       Hand Dominance        Extremity/Trunk Assessment   Upper Extremity Assessment: Defer to OT evaluation           Lower Extremity Assessment: Generalized weakness      Cervical / Trunk Assessment: Normal  Communication   Communication: No difficulties  Cognition Arousal/Alertness: Awake/alert Behavior During Therapy: WFL for tasks assessed/performed Overall Cognitive Status:  Within Functional Limits for tasks assessed                      General Comments General comments (skin integrity, edema, etc.): Tested all canals for BPPV.  No nystagmus of dizziness.  Pt with positive head thrust to right side suspicious of right hypofunction.  Gaze holding nystagmus suggests right hypofunction as well.     Exercises Other Exercises Other Exercises: Initiated x1  exercises in sitting head nods and head side to side.  Pt demonstrates and verbalizes understanding of exercises in sitting.  Spoke about progression of standing extercises with therrapy and with helper.        Assessment/Plan    PT Assessment Patient needs continued PT services  PT Diagnosis Generalized weakness (dizziness)   PT Problem List Decreased activity tolerance;Decreased balance;Decreased mobility;Decreased knowledge of use of DME;Decreased safety awareness (dizziness)  PT Treatment Interventions DME instruction;Gait training;Stair training;Functional mobility training;Therapeutic activities;Therapeutic exercise;Balance training;Patient/family education   PT Goals (Current goals can be found in the Care Plan section) Acute Rehab PT Goals Patient Stated Goal: to go home PT Goal Formulation: With patient Time For Goal Achievement: 07/28/15 Potential to Achieve Goals: Good    Frequency Min 3X/week   Barriers to discharge        Co-evaluation               End of Session Equipment Utilized During Treatment: Gait belt Activity Tolerance: Patient limited by fatigue Patient left: in chair;with call bell/phone within reach;with chair alarm set Nurse Communication: Mobility status         Time: 1138-1205 PT Time Calculation (min) (ACUTE ONLY): 27 min   Charges:   PT Evaluation $Initial PT Evaluation Tier I: 1 Procedure PT Treatments $Self Care/Home Management: 8-22   PT G CodesDenice Paradise 08-03-15, 3:05 PM Lateefa Crosby [facility] Acute Rehabilitation [phone] (pager)

## 2015-07-14 NOTE — Progress Notes (Signed)
Melissa Howe [id number] DOB: 15-Mar-1951 DOA: 07/13/2015 PCP: No PCP Per Patient  Brief narrative: 34 ?  htn hld DM tyII Recently seen in Ed 9/14 with Pyelo and unable to take Macrobid at home devleoepd dizzyness and vertigo and admitted with OP failure to manage pyelo   Past medical history-As per Problem list Chart reviewed as below-   Consultants:    Procedures:    Antibiotics:  Ceftriaxone 9/17   Subjective   Well no issue tol some diet only had 50% No n/v No diarr   Objective    Interim History:   Telemetry:    Objective: Filed Vitals:   07/13/15 1621 07/13/15 1822 07/13/15 1933 07/14/15 0530  BP: 178/74 172/84 167/76 172/82  Pulse: 72 77 78 70  Temp:   98.2 F (36.8 C) 97.9 F (36.6 C)  TempSrc:   Oral Oral  Resp:   17 18  Height:   5' 2.5" (1.588 m)   Weight:   78.3 kg (172 lb 9.9 oz)   SpO2: 99% 96% 97% 98%    Intake/Output Summary (Last 24 hours) at 07/14/15 1412 Last data filed at 07/14/15 1400  Gross per 24 hour  Intake   1575 ml  Output    350 ml  Net   1225 ml    Exam:  General: eomi ncat Cardiovascular: s1 s 2no m/r/g Respiratory: clear no added sound Abdomen: sof tnt Skin no le edema  Data Reviewed: Basic Metabolic Panel:  Recent Labs Lab 07/11/15 1415 07/13/15 1409 07/14/15 0604  NA 138 139 140  K 4.5 3.5 3.3*  CL 100* 102 108  CO2 29 29 26   GLUCOSE 428* 122* 85  BUN 17 17 12   CREATININE 1.00 1.02* 0.96  CALCIUM 9.4 9.4 8.5*   Liver Function Tests:  Recent Labs Lab 07/11/15 1415 07/13/15 1409  AST 17 18  ALT 11* 12*  ALKPHOS 72 73  BILITOT 0.7 0.5  PROT 6.9 7.3  ALBUMIN 3.3* 3.7    Recent Labs Lab 07/11/15 1415  LIPASE 25   No results for input(s): AMMONIA in the last 168 hours. CBC:  Recent Labs Lab 07/11/15 1415 07/13/15 1409 07/14/15 0604  WBC 11.4* 7.8 7.4  NEUTROABS 9.0*  --   --   HGB 12.7 13.7 11.5*  HCT 38.7 41.2 35.4*  MCV 85.2 85.3 85.7  PLT 301 336 293   Cardiac  Enzymes:  Recent Labs Lab 07/13/15 1409  TROPONINI <0.03   BNP: Invalid input(s): POCBNP CBG:  Recent Labs Lab 07/13/15 1416 07/13/15 2149 07/13/15 2228 07/14/15 0730 07/14/15 1224  GLUCAP 114* 52* 128* 90 140*    Recent Results (from the past 240 hour(s))  Urine culture     Status: None   Collection Time: 07/11/15  4:05 PM  Result Value Ref Range Status   Specimen Description URINE, CLEAN CATCH  Final   Special Requests NONE  Final   Culture   Final    >=100,000 COLONIES/mL GROUP B STREP(S.AGALACTIAE)ISOLATED TESTING AGAINST S. AGALACTIAE NOT ROUTINELY PERFORMED DUE TO PREDICTABILITY OF AMP/PEN/VAN SUSCEPTIBILITY. Performed at [facility]    Report Status 07/13/2015 FINAL  Final     Studies:              All Imaging reviewed and is as per above notation   Scheduled Meds: . amLODipine  5 mg Oral Daily  . carvedilol  6.25 mg Oral BID WC  . cefTRIAXone (ROCEPHIN)  IV  1 g Intravenous Q24H  .  heparin  5,000 Units Subcutaneous 3 times per day  . insulin aspart  0-9 Units Subcutaneous TID WC  . insulin detemir  30 Units Subcutaneous QHS  . lisinopril  20 mg Oral Daily  . rosuvastatin  20 mg Oral Daily   Continuous Infusions: . sodium chloride 75 mL/hr at 07/14/15 1235     Assessment/Plan: 1. Failed OP pyelo management, Grp B strep-usually a p-sensitive organism.  Rocephin, CC + diff am.  Monitor 2. Vertigo-MRI brain neg, PT eval reveals R Inner ear issue-exercises taugght-Need Op management.  consider lowering dose coreg 6.25  If orthosattics + however not + so far 3. htn see above.  contuinue Lisinopril 20, coreg 6.25, amlodipine 5 4. DM ty II-Lantus ? from 40-->30  Code Status: full Family Communication:  D/w sister Disposition Plan: home am if toerating diet  Appt with PCP: Requested Code Status: Full code Family Communication: family + Disposition Plan:home DVT prophylaxis: SCD Consultants:   Verneita Griffes, MD  [facility] Pager  [phone] 07/14/2015, 2:12 PM    LOS: 1 day

## 2015-07-15 LAB — CBC WITH DIFFERENTIAL/PLATELET
Basophils Absolute: 0 10*3/uL (ref 0.0–0.1)
Basophils Relative: 0 %
Eosinophils Absolute: 0.3 10*3/uL (ref 0.0–0.7)
Eosinophils Relative: 4 %
HCT: 34.7 % — ABNORMAL LOW (ref 36.0–46.0)
Hemoglobin: 11.7 g/dL — ABNORMAL LOW (ref 12.0–15.0)
Lymphocytes Relative: 60 %
Lymphs Abs: 4.2 10*3/uL — ABNORMAL HIGH (ref 0.7–4.0)
MCH: 29.2 pg (ref 26.0–34.0)
MCHC: 33.7 g/dL (ref 30.0–36.0)
MCV: 86.5 fL (ref 78.0–100.0)
Monocytes Absolute: 0.4 10*3/uL (ref 0.1–1.0)
Monocytes Relative: 5 %
Neutro Abs: 2.1 10*3/uL (ref 1.7–7.7)
Neutrophils Relative %: 31 %
Platelets: 265 10*3/uL (ref 150–400)
RBC: 4.01 MIL/uL (ref 3.87–5.11)
RDW: 12.6 % (ref 11.5–15.5)
WBC: 7 10*3/uL (ref 4.0–10.5)

## 2015-07-15 LAB — GLUCOSE, CAPILLARY: Glucose-Capillary: 143 mg/dL — ABNORMAL HIGH (ref 65–99)

## 2015-07-15 NOTE — Care Management Note (Signed)
Case Management Note  Patient Details  Name: Melissa Howe MRN: [id number] Date of Birth: [birth year]  Subjective/Objective:                   dysuria, nausea, vomiting, dizziness Action/Plan: Discharge planning  Expected Discharge Date:  07/15/15               Expected Discharge Plan:  [place]  In-House Referral:     Discharge planning Services  CM Consult  Post Acute Care Choice:  Home Health Choice offered to:     DME Arranged:    DME Agency:     HH Arranged:  PT [place] Agency:  [facility]  Status of Service:  Completed, signed off  Medicare Important Message Given:    Date Medicare IM Given:    Medicare IM give by:    Date Additional Medicare IM Given:    Additional Medicare Important Message give by:     If discussed at [place] of Stay Meetings, dates discussed:    Additional Comments: CM called pt to offer choice of home health agency.  Pt chooses [facility] to render HHPT.  Address and contact information verified by pt.  Referral called to [facility] rep, tiffany.  No other Cm needs were communicated. Dellie Catholic, RN 07/15/2015, 2:53 PM

## 2015-07-15 NOTE — Progress Notes (Signed)
Physical Therapy Treatment Patient Details Name: Melissa Howe MRN: [id number] DOB: Sep 03, 1951 Today's Date: 07/15/2015    History of Present Illness Melissa Howe is a 64 y.o. female with PMH of hypertension, hyperlipidemia, diabetes mellitus, who presents with nausea, vomiting, dysuria, dizziness.  Has been off balance for over a week per pt.  N/V as well.     PT Comments    Pt admitted with above diagnosis. Pt currently with functional limitations due to decr balance and dizziness. Pt with steadier gait overall today with continued slight LOB which pt was able to self correct.  Pt went up and down steps without difficulty.  HHPT for vestibular rehab recommended.  Does not need A device with pt aware to use compensatory strategies to decrease dizziness in challenging situations.   Pt will benefit from skilled PT to increase their independence and safety with mobility to allow discharge to the venue listed below.    Follow Up Recommendations  Home health PT;Supervision - Intermittent (for vestibular rehab)     Equipment Recommendations  None recommended by PT    Recommendations for Other Services       Precautions / Restrictions Precautions Precautions: Fall Restrictions Weight Bearing Restrictions: No    Mobility  Bed Mobility               General bed mobility comments: in chair on arrival  Transfers Overall transfer level: Independent                  Ambulation/Gait Ambulation/Gait assistance: Independent Ambulation Distance (Feet): 350 Feet Assistive device: None Gait Pattern/deviations: Step-through pattern   Gait velocity interpretation: <1.8 ft/sec, indicative of risk for recurrent falls General Gait Details: Pt ambulated without device with fair balance overall with occasional missteps in which pt self corrected without physical assist.  No significant LOB noted.  Pt challenged and did need steadying assist with challenges with head turns with gait.  Pt  aware of this and how to use compensation strategies to avoid balance issues at home.     Stairs Stairs: Yes Stairs assistance: Modified independent (Device/Increase time) Stair Management: One rail Left;Alternating pattern;Forwards Number of Stairs: 10 General stair comments: Pt had no issues on stairs.  No physical assist needed.   Wheelchair Mobility    Modified [place] (Stroke Patients Only) Modified [place] (Stroke Patients Only) Pre-Morbid Dorsey Score: No symptoms Modified [place]: Moderate disability     Balance Overall balance assessment: Needs assistance         Standing balance support: No upper extremity supported;During functional activity Standing balance-Leahy Scale: Good Standing balance comment: Pt loses balance with moderate challenges but can self correct.  Uses compensatory strategies to assist with balance.               High level balance activites: Direction changes;Turns;Sudden stops;Head turns High Level Balance Comments: supervision to min guard asssit with challenges.  Pt to use compensatory strategies at home.  Focus on targets to avoid LOB with challenging environments.     Cognition Arousal/Alertness: Awake/alert Behavior During Therapy: WFL for tasks assessed/performed Overall Cognitive Status: Within Functional Limits for tasks assessed                      Exercises Other Exercises Other Exercises: Reviewed x1 exercises in standing head nods and head side to side.  Pt demonstrates and verbalizes understanding of exercises.  Performed ambulation toward target "A" on wall while turning head and walking backwards while turning  head.  Pt with a LOB while walking backward needing steadying assist. Discussed with pt that she may continue to have some LOB with challenging environments and how to compensate until she is recovered.  Pt verbalizes understanding to continue to have helper when performing challenging exercise progression.       General Comments General comments (skin integrity, edema, etc.): Pt demonstrates how to use compensatory strategies to perform safely in home until she returns to baseline.  Feels that she is at 85-90% of baseline currently.        Pertinent Vitals/Pain Pain Assessment: No/denies pain  VSS    Home Living                      Prior Function            PT Goals (current goals can now be found in the care plan section) Progress towards PT goals: Progressing toward goals    Frequency  Min 3X/week    PT Plan Current plan remains appropriate    Co-evaluation             End of Session Equipment Utilized During Treatment: Gait belt Activity Tolerance: Patient tolerated treatment well Patient left: in chair;with call bell/phone within reach;with chair alarm set     Time: [id number] PT Time Calculation (min) (ACUTE ONLY): 24 min  Charges:  $Gait Training: 8-22 mins $Therapeutic Exercise: 8-22 mins                    G CodesIrwin Brakeman F 08/14/2015, 8:41 AM [facility] [phone] (pager)

## 2015-07-15 NOTE — Progress Notes (Signed)
Pt discharged to home accomp by dtr.  DC instructions given and reviewed with pt and dtr.  Pt is to follow up with [facility].  She has a CBG meter that she uses at home.  Pt instructed to drink plenty of fluids once home.  Pt understands to continue to take her macrobid once at home per Dr. Verlon Au.  Called Care management to set up home health PT.

## 2015-07-15 NOTE — Discharge Summary (Signed)
Physician Discharge Summary  Melissa Howe [id number] DOB: Dec 14, 1950 DOA: 07/13/2015  PCP: No PCP Per Patient  Admit date: 07/13/2015 Discharge date: 07/15/2015  Time spent: 15 minutes  Recommendations for Outpatient Follow-up:  1. Home health ordered to give Vestibular therapy  2. Needs labs within 1-2 weeks 3. Complete PTA Macrobid dosing  Discharge Diagnoses:  Principal Problem:   UTI (lower urinary tract infection) Active Problems:   Diabetic neuropathy   Diabetic macular edema   Essential hypertension   Nausea with vomiting   Dizziness   Diabetes mellitus without complication   Discharge Condition: fair  Diet recommendation:  none  Filed Weights   07/13/15 1346 07/13/15 1933  Weight: 79.379 kg (175 lb) 78.3 kg (172 lb 9.9 oz)    History of present illness:  54 ?  htn hld DM tyII Recently seen in Ed 9/14 with Pyelo and unable to take Macrobid at home devleoepd dizzyness and vertigo and admitted with OP failure to manage pyelo  Observed overnight and as was tolerating diet and other meds without any deleterious effect patient was d/c home  She doesn't have a pcpc and the patient will need OP follow up at [facility] where she plans to est care  Discharge Exam: Filed Vitals:   07/15/15 0806  BP: 143/89  Pulse: 67  Temp: 98.3 F (36.8 C)  Resp: 16    General: alert pleasant oriented in nad Cardiovascular:  s1 s 2no m/r/g Respiratory: clear no added sound  Discharge Instructions   Discharge Instructions    Diet - low sodium heart healthy    Complete by:  As directed      Increase activity slowly    Complete by:  As directed           Current Discharge Medication List    CONTINUE these medications which have NOT CHANGED   Details  amLODipine (NORVASC) 5 MG tablet Take 1 tablet (5 mg total) by mouth daily. Qty: 30 tablet, Refills: 0    carvedilol (COREG) 6.25 MG tablet Take 1 tablet (6.25 mg total) by mouth 2 (two) times daily with a  meal. Qty: 60 tablet, Refills: 0    insulin aspart protamine- aspart (NOVOLOG MIX 70/30) (70-30) 100 UNIT/ML injection Inject 0.15-0.2 mLs (15-20 Units total) into the skin 3 (three) times daily. Qty: 10 mL, Refills: 0    insulin detemir (LEVEMIR) 100 UNIT/ML injection Inject 0.4 mLs (40 Units total) into the skin at bedtime. Qty: 10 mL, Refills: 0    lisinopril-hydrochlorothiazide (PRINZIDE,ZESTORETIC) 20-12.5 MG per tablet Take 1 tablet by mouth daily. Qty: 30 tablet, Refills: 0    metFORMIN (GLUCOPHAGE) 500 MG tablet Take 2 tablets (1,000 mg total) by mouth 2 (two) times daily with a meal. Qty: 60 tablet, Refills: 0    nitrofurantoin, macrocrystal-monohydrate, (MACROBID) 100 MG capsule Take 1 capsule (100 mg total) by mouth 2 (two) times daily. Qty: 10 capsule, Refills: 0    ondansetron (ZOFRAN-ODT) 4 MG disintegrating tablet Take 1 tablet (4 mg total) by mouth every 8 (eight) hours as needed for nausea or vomiting. Qty: 10 tablet, Refills: 0    rosuvastatin (CRESTOR) 20 MG tablet Take 1 tablet (20 mg total) by mouth daily. Qty: 30 tablet, Refills: 0       No Known Allergies    The results of significant diagnostics from this hospitalization (including imaging, microbiology, ancillary and laboratory) are listed below for reference.    Significant Diagnostic Studies: Ct Head Wo Contrast  07/13/2015  CLINICAL DATA:  Headache, dizziness.  EXAM: CT HEAD WITHOUT CONTRAST  TECHNIQUE: Contiguous axial images were obtained from the base of the skull through the vertex without intravenous contrast.  COMPARISON:  07/16/2012  FINDINGS: No acute cortical infarct, hemorrhage, or mass lesion ispresent. Ventricles are of normal size. No significant extra-axial fluid collection is present. The paranasal sinuses andmastoid air cells are clear. The osseous skull is intact.  IMPRESSION: Negative exam.   Electronically Signed   By: Kerby Moors M.D.   On: 07/13/2015 16:29   Mr Brain Wo  Contrast  07/14/2015   CLINICAL DATA:  History of diabetes, hypertension. Vomiting for few days with gradual onset headache this morning  EXAM: MRI HEAD WITHOUT CONTRAST  TECHNIQUE: Multiplanar, multiecho pulse sequences of the brain and surrounding structures were obtained without intravenous contrast.  COMPARISON:  CT head July 13, 2015  FINDINGS: The ventricles and sulci are normal for patient's age. No abnormal parenchymal signal, mass lesions, mass effect. No reduced diffusion to suggest acute ischemia. Patchy supratentorial white matter T2 hyperintensities compatible with chronic small vessel ischemic disease, within normal range for patient's age. No susceptibility artifact to suggest hemorrhage.  No abnormal extra-axial fluid collections. No extra-axial masses though, contrast enhanced sequences would be more sensitive. Normal major intracranial vascular flow voids seen at the skull base.  Ocular globes and orbital contents are unremarkable though not tailored for evaluation. No abnormal sellar expansion. Visualized paranasal sinuses and mastoid air cells are well-aerated. No suspicious calvarial bone marrow signal. No abnormal sellar expansion ; 2 mm probable pars in intermedius cyst. Craniocervical junction maintained. Patient is edentulous.  IMPRESSION: Negative noncontrast MRI of the brain for age.   Electronically Signed   By: Elon Alas M.D.   On: 07/14/2015 04:00    Microbiology: Recent Results (from the past 240 hour(s))  Urine culture     Status: None   Collection Time: 07/11/15  4:05 PM  Result Value Ref Range Status   Specimen Description URINE, CLEAN CATCH  Final   Special Requests NONE  Final   Culture   Final    >=100,000 COLONIES/mL GROUP B STREP(S.AGALACTIAE)ISOLATED TESTING AGAINST S. AGALACTIAE NOT ROUTINELY PERFORMED DUE TO PREDICTABILITY OF AMP/PEN/VAN SUSCEPTIBILITY. Performed at [facility]    Report Status 07/13/2015 FINAL  Final  Culture, blood  (routine x 2)     Status: None (Preliminary result)   Collection Time: 07/13/15  9:59 PM  Result Value Ref Range Status   Specimen Description BLOOD LEFT ANTECUBITAL  Final   Special Requests BOTTLES DRAWN AEROBIC AND ANAEROBIC 10CC   Final   Culture NO GROWTH < 24 HOURS  Final   Report Status PENDING  Incomplete  Culture, blood (routine x 2)     Status: None (Preliminary result)   Collection Time: 07/13/15 10:07 PM  Result Value Ref Range Status   Specimen Description BLOOD LEFT HAND  Final   Special Requests BOTTLES DRAWN AEROBIC AND ANAEROBIC 10CC   Final   Culture NO GROWTH < 24 HOURS  Final   Report Status PENDING  Incomplete     Labs: Basic Metabolic Panel:  Recent Labs Lab 07/11/15 1415 07/13/15 1409 07/14/15 0604  NA 138 139 140  K 4.5 3.5 3.3*  CL 100* 102 108  CO2 29 29 26   GLUCOSE 428* 122* 85  BUN 17 17 12   CREATININE 1.00 1.02* 0.96  CALCIUM 9.4 9.4 8.5*   Liver Function Tests:  Recent Labs Lab 07/11/15 1415 07/13/15 1409  AST 17 18  ALT 11* 12*  ALKPHOS 72 73  BILITOT 0.7 0.5  PROT 6.9 7.3  ALBUMIN 3.3* 3.7    Recent Labs Lab 07/11/15 1415  LIPASE 25   No results for input(s): AMMONIA in the last 168 hours. CBC:  Recent Labs Lab 07/11/15 1415 07/13/15 1409 07/14/15 0604 07/15/15 0514  WBC 11.4* 7.8 7.4 7.0  NEUTROABS 9.0*  --   --  2.1  HGB 12.7 13.7 11.5* 11.7*  HCT 38.7 41.2 35.4* 34.7*  MCV 85.2 85.3 85.7 86.5  PLT 301 336 293 265   Cardiac Enzymes:  Recent Labs Lab 07/13/15 1409  TROPONINI <0.03   BNP: BNP (last 3 results) No results for input(s): BNP in the last 8760 hours.  ProBNP (last 3 results) No results for input(s): PROBNP in the last 8760 hours.  CBG:  Recent Labs Lab 07/14/15 0730 07/14/15 1224 07/14/15 1652 07/14/15 2155 07/15/15 0754  GLUCAP 90 140* 157* 253* 143*       Signed:  Nita Sells  [facility] 07/15/2015, 10:17 AM

## 2015-07-16 LAB — URINE CULTURE

## 2015-07-16 LAB — HEMOGLOBIN A1C
Hgb A1c MFr Bld: 15 % — ABNORMAL HIGH (ref 4.8–5.6)
Mean Plasma Glucose: 384 mg/dL

## 2015-07-17 ENCOUNTER — Telehealth (HOSPITAL_COMMUNITY): Payer: Self-pay

## 2015-07-17 NOTE — Telephone Encounter (Signed)
Post ED Visit - Positive Culture Follow-up  Culture report reviewed by antimicrobial stewardship pharmacist:  [x]  Heide Guile, Pharm.D., BCPS []  Alycia Rossetti, Pharm.D., BCPS []  [place], Pharm.D., BCPS, AAHIVP []  Legrand Como, Pharm.D., BCPS, AAHIVP []  Cristy Reyes, Pharm.D. []  Milus Glazier, [place].D.  Positive Urine culture, 100,000 colonies -> Group B Strep Treated with Nitrofurantoin, organism sensitive to the same and no further patient follow-up is required at this time.  Dortha Kern 07/17/2015, 4:01 AM

## 2015-07-18 LAB — CULTURE, BLOOD (ROUTINE X 2)
Culture: NO GROWTH
Culture: NO GROWTH

## 2015-11-13 ENCOUNTER — Encounter: Payer: Medicare Other | Attending: Family Medicine | Admitting: *Deleted

## 2015-11-13 ENCOUNTER — Encounter: Payer: Self-pay | Admitting: *Deleted

## 2015-11-13 VITALS — Ht 62.5 in | Wt 185.6 lb

## 2015-11-13 DIAGNOSIS — Z713 Dietary counseling and surveillance: Secondary | ICD-10-CM | POA: Insufficient documentation

## 2015-11-13 DIAGNOSIS — E119 Type 2 diabetes mellitus without complications: Secondary | ICD-10-CM | POA: Diagnosis not present

## 2015-11-13 NOTE — Patient Instructions (Signed)
Consider going to [place] 7:00AM to walk, Gradually increase the amount of time and number of days per week Consider:   Diet Pepsi vs Regular Pepsi   Crystal Light Lemonade vs regular sweet lemonade    Try not to buy the Honey Buns   Consider Three Musketeer snack bars vs large bars and decrease amount gradually Consider rationing hershey kisses 3 per day.   Remember it is all up to you!  Consider Dannon Light & Fit Greek Yogurt Buy canned fruit in juice and pour out the juice and put the fruit only in your cottage cheese ALWAYS Gays are doing much better than last time we met...Marland KitchenMarland KitchenMarland Kitchen Keep working in the right direction!

## 2015-11-13 NOTE — Progress Notes (Signed)
Diabetes Self-Management Education  Visit Type: First/Initial  Appt. Start Time: 1100 Appt. End Time: 1230  11/13/2015  [name], identified by name and date of birth, is a 65 y.o. female with a diagnosis of Diabetes: Type 2. [name] returns to focus on dietary intake recommendations. She has greatly improved her eating behavior since last seen. She used to eat sticky buns for breakfast each day, now they are an occasional snack. In review of her dietary she still has a great deal of opportunity for improvement. She lives with her daughter and 15yo grandson. [name] also has a daughter with Down's Syndrome that lives with them.  ASSESSMENT  Height 5' 2.5" (1.588 m), weight 185 lb 9.6 oz (84.188 kg). Body mass index is 33.38 kg/(m^2).      Diabetes Self-Management Education - 11/13/15 1106    Visit Information   Visit Type First/Initial   Initial Visit   Diabetes Type Type 2   Are you currently following a meal plan? No   Are you taking your medications as prescribed? Yes   Date Diagnosed 1986   Health Coping   How would you rate your overall health? Good   Psychosocial Assessment   Patient Belief/Attitude about Diabetes Motivated to manage diabetes   Self-care barriers None   Self-management support Doctor's office;Family;Friends;CDE visits   Other persons present Patient   Patient Concerns Nutrition/Meal planning   Special Needs None   Preferred Learning Style No preference indicated   Learning Readiness Change in progress   How often do you need to have someone help you when you read instructions, pamphlets, or other written materials from your doctor or pharmacy? 2 - Rarely   Complications   Last HgB A1C per patient/outside source 12.9 %   How often do you check your blood sugar? 1-2 times/day  FBS only   Fasting Blood glucose range (mg/dL) 70-129;130-179  120-150   Have you had a dilated eye exam in the past 12 months? No   Have you had a dental exam in the  past 12 months? No   Are you checking your feet? Yes   How many days per week are you checking your feet? 2   Dietary Intake   Breakfast 9:30 2 packets of instant oatmeal (54g, carbs), milk water   Lunch 1:30  [employer] 1 regular burger, small fry, sweet tea / chick-fil-a chicken sandwich, honey mustard, lemonade(regular)   Snack (afternoon) peanut butter & crackers /    Dinner 7:00  baked chicken / pork chops  with heavy gravy/ steak/ green beans, mashed potatoes / collard greens ,/ rice, peas, carrotts    Snack (evening) honey bun   Beverage(s) water, sweet tea, regular lemonade, regular Pepsi   Exercise   Exercise Type ADL's   Patient Education   Previous Diabetes Education Yes (please comment)   Nutrition management  Role of diet in the treatment of diabetes and the relationship between the three main macronutrients and blood glucose level   Physical activity and exercise  Role of exercise on diabetes management, blood pressure control and cardiac health.   Medications Reviewed patients medication for diabetes, action, purpose, timing of dose and side effects.   Monitoring Identified appropriate SMBG and/or A1C goals.   Chronic complications Relationship between chronic complications and blood glucose control;Lipid levels, blood glucose control and heart disease;Identified and discussed with patient  current chronic complications   Individualized Goals (developed by patient)   Nutrition General guidelines for healthy choices  and portions discussed   Physical Activity Exercise 1-2 times per week;30 minutes per day   Medications take my medication as prescribed   Monitoring  test my blood glucose as discussed   Outcomes   Expected Outcomes Demonstrated interest in learning. Expect positive outcomes   Future DMSE PRN   Program Status Completed      Individualized Plan for Diabetes Self-Management Training:   Learning Objective:  Patient will have a greater understanding of diabetes  self-management. Patient education plan is to attend individual and/or group sessions per assessed needs and concerns.   Plan:   Patient Instructions  Consider going to [place] 7:00AM to walk, Gradually increase the amount of time and number of days per week Consider:   Diet Pepsi vs Regular Pepsi   Crystal Light Lemonade vs regular sweet lemonade    Try not to buy the Honey Buns   Consider Three Musketeer snack bars vs large bars and decrease amount gradually Consider rationing hershey kisses 3 per day.   Remember it is all up to you!  Consider Dannon Light & Fit Greek Yogurt Buy canned fruit in juice and pour out the juice and put the fruit only in your cottage cheese ALWAYS [place] are doing much better than last time we met...Marland KitchenMarland KitchenMarland Kitchen Keep working in the right direction!   Expected Outcomes:  Demonstrated interest in learning. Expect positive outcomes  Education material provided: Meal plan card  If problems or questions, patient to contact team via:  Phone  Future DSME appointment: PRN

## 2016-02-24 ENCOUNTER — Emergency Department (HOSPITAL_BASED_OUTPATIENT_CLINIC_OR_DEPARTMENT_OTHER): Payer: Medicare Other

## 2016-02-24 ENCOUNTER — Emergency Department (HOSPITAL_BASED_OUTPATIENT_CLINIC_OR_DEPARTMENT_OTHER)
Admission: EM | Admit: 2016-02-24 | Discharge: 2016-02-24 | Disposition: A | Discharge status: Home / Self Care | Payer: Medicare Other | Attending: Emergency Medicine | Admitting: Emergency Medicine

## 2016-02-24 ENCOUNTER — Encounter (HOSPITAL_BASED_OUTPATIENT_CLINIC_OR_DEPARTMENT_OTHER): Payer: Self-pay | Admitting: *Deleted

## 2016-02-24 DIAGNOSIS — Z7984 Long term (current) use of oral hypoglycemic drugs: Secondary | ICD-10-CM | POA: Diagnosis not present

## 2016-02-24 DIAGNOSIS — E119 Type 2 diabetes mellitus without complications: Secondary | ICD-10-CM | POA: Insufficient documentation

## 2016-02-24 DIAGNOSIS — Z87891 Personal history of nicotine dependence: Secondary | ICD-10-CM | POA: Insufficient documentation

## 2016-02-24 DIAGNOSIS — W01198A Fall on same level from slipping, tripping and stumbling with subsequent striking against other object, initial encounter: Secondary | ICD-10-CM | POA: Insufficient documentation

## 2016-02-24 DIAGNOSIS — Y939 Activity, unspecified: Secondary | ICD-10-CM | POA: Insufficient documentation

## 2016-02-24 DIAGNOSIS — Y999 Unspecified external cause status: Secondary | ICD-10-CM | POA: Diagnosis not present

## 2016-02-24 DIAGNOSIS — I10 Essential (primary) hypertension: Secondary | ICD-10-CM | POA: Diagnosis not present

## 2016-02-24 DIAGNOSIS — Y92002 Bathroom of unspecified non-institutional (private) residence single-family (private) house as the place of occurrence of the external cause: Secondary | ICD-10-CM | POA: Diagnosis not present

## 2016-02-24 DIAGNOSIS — E785 Hyperlipidemia, unspecified: Secondary | ICD-10-CM | POA: Diagnosis not present

## 2016-02-24 DIAGNOSIS — S299XXA Unspecified injury of thorax, initial encounter: Secondary | ICD-10-CM | POA: Diagnosis present

## 2016-02-24 DIAGNOSIS — W19XXXA Unspecified fall, initial encounter: Secondary | ICD-10-CM

## 2016-02-24 DIAGNOSIS — Z79899 Other long term (current) drug therapy: Secondary | ICD-10-CM | POA: Diagnosis not present

## 2016-02-24 DIAGNOSIS — S21109A Unspecified open wound of unspecified front wall of thorax without penetration into thoracic cavity, initial encounter: Secondary | ICD-10-CM

## 2016-02-24 MED ORDER — HYDROCODONE-ACETAMINOPHEN 5-325 MG PO TABS: 1.0000 | ORAL_TABLET | ORAL | Status: DC | PRN

## 2016-02-24 MED ORDER — LISINOPRIL-HYDROCHLOROTHIAZIDE 20-12.5 MG PO TABS: 1.0000 | ORAL_TABLET | Freq: Every day | ORAL | Status: DC

## 2016-02-24 MED ORDER — CARVEDILOL 6.25 MG PO TABS: 6.2500 mg | ORAL_TABLET | Freq: Two times a day (BID) | ORAL | Status: DC

## 2016-02-24 MED ORDER — AMLODIPINE BESYLATE 5 MG PO TABS: 5.0000 mg | ORAL_TABLET | Freq: Every day | ORAL | Status: DC

## 2016-02-24 NOTE — ED Notes (Signed)
MD at bedside. 

## 2016-02-24 NOTE — ED Provider Notes (Signed)
CSN: [id number]     Arrival date & time 02/24/16  1506 History  By signing my name below, I, [facility], attest that this documentation has been prepared under the direction and in the presence of Sherwood Gambler, MD. Electronically Signed: Virgel Bouquet, ED Scribe. 02/24/2016. 4:27 PM.     Chief Complaint  Patient presents with  . Fall   The history is provided by the patient. No language interpreter was used.   HPI Comments: Melissa Howe is a 65 y.o. female who presents to the Emergency Department complaining of constant, 7/10 chest wall pain onset last night after a fall. Patient states that she was exiting a bathroom when she slipped and began to fall backwards and attempted to catch herself, causing her to twist and strike her chest on an extra toilet outside the room. Pain improved with bending forward and worse with deep breaths. Denies LOC, dizziness, lightheadedness, ecchymosis, abdominal pain.  Past Medical History  Diagnosis Date  . Hypertension   . Diabetes mellitus   . Hyperlipemia    Past Surgical History  Procedure Laterality Date  . Appendectomy    . Tonsillectomy    . Tubal ligation     Family History  Problem Relation Age of Onset  . Hypertension Mother   . Diabetes Mother   . Hypertension Sister    Social History  Substance Use Topics  . Smoking status: Former Smoker -- 4 years    Types: Cigars    Quit date: 01/19/1984  . Smokeless tobacco: None  . Alcohol Use: No   OB History    No data available     Review of Systems  Gastrointestinal: Negative for abdominal pain.  Musculoskeletal:       Positive for chest wall tenderness.  Skin: Negative for color change.  Neurological: Negative for dizziness, syncope and light-headedness.  All other systems reviewed and are negative.  Allergies  Review of patient's allergies indicates no known allergies.  Home Medications   Prior to Admission medications   Medication Sig Start Date End Date  Taking? Authorizing Provider  amLODipine (NORVASC) 5 MG tablet Take 1 tablet (5 mg total) by mouth daily. 07/11/15   Davonna Belling, MD  carvedilol (COREG) 6.25 MG tablet Take 1 tablet (6.25 mg total) by mouth 2 (two) times daily with a meal. 07/11/15   Davonna Belling, MD  insulin aspart protamine- aspart (NOVOLOG MIX 70/30) (70-30) 100 UNIT/ML injection Inject 0.15-0.2 mLs (15-20 Units total) into the skin 3 (three) times daily. Patient not taking: Reported on 11/13/2015 07/11/15   Davonna Belling, MD  insulin detemir (LEVEMIR) 100 UNIT/ML injection Inject 0.4 mLs (40 Units total) into the skin at bedtime. Patient taking differently: Inject 30 Units into the skin at bedtime.  07/11/15   Davonna Belling, MD  lisinopril-hydrochlorothiazide (PRINZIDE,ZESTORETIC) 20-12.5 MG per tablet Take 1 tablet by mouth daily. Patient not taking: Reported on 11/13/2015 07/11/15   Davonna Belling, MD  metFORMIN (GLUCOPHAGE) 500 MG tablet Take 2 tablets (1,000 mg total) by mouth 2 (two) times daily with a meal. Patient not taking: Reported on 11/13/2015 07/11/15   Davonna Belling, MD  nitrofurantoin, macrocrystal-monohydrate, (MACROBID) 100 MG capsule Take 1 capsule (100 mg total) by mouth 2 (two) times daily. 07/11/15   Charlesetta Shanks, MD  ondansetron (ZOFRAN-ODT) 4 MG disintegrating tablet Take 1 tablet (4 mg total) by mouth every 8 (eight) hours as needed for nausea or vomiting. 07/11/15   Davonna Belling, MD  rosuvastatin (CRESTOR) 20 MG tablet Take  1 tablet (20 mg total) by mouth daily. Patient not taking: Reported on 11/13/2015 07/11/15   Davonna Belling, MD  sitaGLIPtin-metformin (JANUMET) 50-1000 MG tablet Take 1 tablet by mouth 2 (two) times daily with a meal.    Historical Provider, MD   BP 180/94 mmHg  Pulse 82  Temp(Src) 97.6 F (36.4 C) (Oral)  Resp 18  Ht 5\' 3"  (1.6 m)  Wt 175 lb (79.379 kg)  BMI 31.01 kg/m2  SpO2 99% Physical Exam  Constitutional: She is oriented to person, place, and time. She  appears well-developed and well-nourished.  HENT:  Head: Normocephalic and atraumatic.  Right Ear: External ear normal.  Left Ear: External ear normal.  Nose: Nose normal.  Eyes: Right eye exhibits no discharge. Left eye exhibits no discharge.  Cardiovascular: Normal rate, regular rhythm and normal heart sounds.   Pulmonary/Chest: Effort normal and breath sounds normal. She exhibits tenderness.    Abdominal: Soft. There is no tenderness.  Neurological: She is alert and oriented to person, place, and time.  Skin: Skin is warm and dry.  Nursing note and vitals reviewed.   ED Course  Procedures   DIAGNOSTIC STUDIES: Oxygen Saturation is 99% on RA, normal by my interpretation.    COORDINATION OF CARE: 4:12 PM Discussed results of imaging. Will prescribe pain medication and incentive spirometer. Discussed treatment plan with pt at bedside and pt agreed to plan.  Imaging Review Dg Sternum  02/24/2016  CLINICAL DATA:  Patient reports she fell today on her anterior surface and hit her chest/sternum on a toilet bowl. Patient reports pain at distal anterior portion of sternum. No previous injury reported. Hx HTN, diabetes, ex-smoker. EXAM: STERNUM - 2+ VIEW COMPARISON:  12/02/2009 FINDINGS: There is no evidence of fracture or other focal bone lesions. Heart size and vascular pattern normal. No pneumothorax or effusion. Uncoiling of the aorta stable. IMPRESSION: No fracture involving the sternum.  No acute findings. If clinical suspicion warrants, consider CT scan for increase sensitivity for subtle nondepressed sternum fractures. Electronically Signed   By: Skipper Cliche M.D.   On: 02/24/2016 15:57   I have personally reviewed and evaluated these images as part of my medical decision-making.   MDM   Final diagnoses:  Chest wall soft tissue injury, unspecified laterality, initial encounter  Essential hypertension    Patient with mild chest wall tenderness without ecchymosis or  crepitus. X-ray shows no obvious fracture or pneumothorax. Given her pain appears to be mild with no distress to think that's a CT scan would not be indicated. Plan to treat with hydrocodone at home. Instructed to use incentive spirometer. Patient also has asymptomatic hypertension, requests a refill of her hypertensive medicines after I discussed the importance of taking her medicines daily. Patient does not exactly how she fell but does not have dizziness or lightheadedness and no syncope. Likely she slipped. Discussed return precautions.  I personally performed the services described in this documentation, which was scribed in my presence. The recorded information has been reviewed and is accurate.    Sherwood Gambler, MD 02/24/16 [phone]

## 2016-02-24 NOTE — ED Notes (Signed)
Pt states she felt herself falling and tried to catch herself when her feet slipped and she slid on the floor, falling forward to which her chest hit the side of the toilet. She c/o soreness in the mid chest area.  She was seen at urgent care prior to here and sent here for xrays. No acute distress.
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
## 2016-02-24 NOTE — ED Notes (Signed)
Pt given d/c instructions as per chart. Verbalizes understanding. No questions. Rx x 4 with narc prec instruc and Tylenol advisory.

## 2016-09-06 ENCOUNTER — Emergency Department (HOSPITAL_COMMUNITY): Payer: No Typology Code available for payment source

## 2016-09-06 ENCOUNTER — Inpatient Hospital Stay (HOSPITAL_COMMUNITY)
Admission: EM | Admit: 2016-09-06 | Discharge: 2016-09-08 | DRG: 638 | Disposition: A | Discharge status: Home / Self Care | Payer: No Typology Code available for payment source | Attending: Internal Medicine | Admitting: Internal Medicine

## 2016-09-06 ENCOUNTER — Encounter (HOSPITAL_COMMUNITY): Payer: Self-pay

## 2016-09-06 DIAGNOSIS — Z79891 Long term (current) use of opiate analgesic: Secondary | ICD-10-CM

## 2016-09-06 DIAGNOSIS — I10 Essential (primary) hypertension: Secondary | ICD-10-CM | POA: Diagnosis present

## 2016-09-06 DIAGNOSIS — E1142 Type 2 diabetes mellitus with diabetic polyneuropathy: Secondary | ICD-10-CM | POA: Diagnosis not present

## 2016-09-06 DIAGNOSIS — Z794 Long term (current) use of insulin: Secondary | ICD-10-CM

## 2016-09-06 DIAGNOSIS — Z9119 Patient's noncompliance with other medical treatment and regimen: Secondary | ICD-10-CM

## 2016-09-06 DIAGNOSIS — R55 Syncope and collapse: Secondary | ICD-10-CM

## 2016-09-06 DIAGNOSIS — R809 Proteinuria, unspecified: Secondary | ICD-10-CM | POA: Diagnosis present

## 2016-09-06 DIAGNOSIS — R739 Hyperglycemia, unspecified: Secondary | ICD-10-CM | POA: Diagnosis present

## 2016-09-06 DIAGNOSIS — Y92410 Unspecified street and highway as the place of occurrence of the external cause: Secondary | ICD-10-CM

## 2016-09-06 DIAGNOSIS — Z91128 Patient's intentional underdosing of medication regimen for other reason: Secondary | ICD-10-CM

## 2016-09-06 DIAGNOSIS — E1100 Type 2 diabetes mellitus with hyperosmolarity without nonketotic hyperglycemic-hyperosmolar coma (NKHHC): Secondary | ICD-10-CM | POA: Diagnosis not present

## 2016-09-06 DIAGNOSIS — T383X6A Underdosing of insulin and oral hypoglycemic [antidiabetic] drugs, initial encounter: Secondary | ICD-10-CM | POA: Diagnosis present

## 2016-09-06 DIAGNOSIS — E1165 Type 2 diabetes mellitus with hyperglycemia: Secondary | ICD-10-CM

## 2016-09-06 DIAGNOSIS — Z79899 Other long term (current) drug therapy: Secondary | ICD-10-CM

## 2016-09-06 DIAGNOSIS — N179 Acute kidney failure, unspecified: Secondary | ICD-10-CM

## 2016-09-06 DIAGNOSIS — F329 Major depressive disorder, single episode, unspecified: Secondary | ICD-10-CM | POA: Diagnosis present

## 2016-09-06 DIAGNOSIS — E669 Obesity, unspecified: Secondary | ICD-10-CM | POA: Diagnosis present

## 2016-09-06 DIAGNOSIS — E1140 Type 2 diabetes mellitus with diabetic neuropathy, unspecified: Secondary | ICD-10-CM | POA: Diagnosis present

## 2016-09-06 DIAGNOSIS — E0841 Diabetes mellitus due to underlying condition with diabetic mononeuropathy: Secondary | ICD-10-CM

## 2016-09-06 DIAGNOSIS — Z833 Family history of diabetes mellitus: Secondary | ICD-10-CM

## 2016-09-06 DIAGNOSIS — E785 Hyperlipidemia, unspecified: Secondary | ICD-10-CM | POA: Diagnosis present

## 2016-09-06 DIAGNOSIS — Z91199 Patient's noncompliance with other medical treatment and regimen due to unspecified reason: Secondary | ICD-10-CM

## 2016-09-06 DIAGNOSIS — Z9114 Patient's other noncompliance with medication regimen: Secondary | ICD-10-CM

## 2016-09-06 DIAGNOSIS — Z6832 Body mass index (BMI) 32.0-32.9, adult: Secondary | ICD-10-CM

## 2016-09-06 DIAGNOSIS — E119 Type 2 diabetes mellitus without complications: Secondary | ICD-10-CM

## 2016-09-06 DIAGNOSIS — Z87891 Personal history of nicotine dependence: Secondary | ICD-10-CM

## 2016-09-06 DIAGNOSIS — E860 Dehydration: Secondary | ICD-10-CM

## 2016-09-06 DIAGNOSIS — F4320 Adjustment disorder, unspecified: Secondary | ICD-10-CM | POA: Diagnosis present

## 2016-09-06 DIAGNOSIS — F32A Depression, unspecified: Secondary | ICD-10-CM

## 2016-09-06 LAB — URINE MICROSCOPIC-ADD ON

## 2016-09-06 LAB — CBG MONITORING, ED
Glucose-Capillary: 543 mg/dL (ref 65–99)
Glucose-Capillary: 475 mg/dL — ABNORMAL HIGH (ref 65–99)
Glucose-Capillary: 422 mg/dL — ABNORMAL HIGH (ref 65–99)
Glucose-Capillary: 267 mg/dL — ABNORMAL HIGH (ref 65–99)
Glucose-Capillary: 347 mg/dL — ABNORMAL HIGH (ref 65–99)

## 2016-09-06 LAB — URINALYSIS, ROUTINE W REFLEX MICROSCOPIC
Bilirubin Urine: NEGATIVE
Glucose, UA: >1000 mg/dL — AB
Ketones, ur: NEGATIVE mg/dL
Leukocytes, UA: NEGATIVE
Nitrite: NEGATIVE
Protein, ur: 100 mg/dL — AB
Specific Gravity, Urine: 1.026 (ref 1.005–1.030)
pH: 6.5 (ref 5.0–8.0)

## 2016-09-06 LAB — BASIC METABOLIC PANEL
Anion gap: 11 (ref 5–15)
BUN: 21 mg/dL — ABNORMAL HIGH (ref 6–20)
CO2: 24 mmol/L (ref 22–32)
Calcium: 9 mg/dL (ref 8.9–10.3)
Chloride: 95 mmol/L — ABNORMAL LOW (ref 101–111)
Creatinine, Ser: 1.48 mg/dL — ABNORMAL HIGH (ref 0.44–1.00)
GFR calc Af Amer: 41 mL/min — ABNORMAL LOW (ref >60)
GFR calc non Af Amer: 36 mL/min — ABNORMAL LOW (ref >60)
Glucose, Bld: 605 mg/dL (ref 65–99)
Potassium: 4.2 mmol/L (ref 3.5–5.1)
Sodium: 130 mmol/L — ABNORMAL LOW (ref 135–145)

## 2016-09-06 LAB — CBC
HCT: 37 % (ref 36.0–46.0)
Hemoglobin: 12.5 g/dL (ref 12.0–15.0)
MCH: 28.2 pg (ref 26.0–34.0)
MCHC: 33.8 g/dL (ref 30.0–36.0)
MCV: 83.5 fL (ref 78.0–100.0)
Platelets: 251 10*3/uL (ref 150–400)
RBC: 4.43 MIL/uL (ref 3.87–5.11)
RDW: 12.3 % (ref 11.5–15.5)
WBC: 8.6 10*3/uL (ref 4.0–10.5)

## 2016-09-06 LAB — GLUCOSE, CAPILLARY: Glucose-Capillary: 317 mg/dL — ABNORMAL HIGH (ref 65–99)

## 2016-09-06 MED ORDER — ACETAMINOPHEN 650 MG RE SUPP: 650.0000 mg | Freq: Four times a day (QID) | RECTAL | Status: DC | PRN

## 2016-09-06 MED ORDER — DEXTROSE 50 % IV SOLN: 25.0000 mL | INTRAVENOUS | Status: DC | PRN

## 2016-09-06 MED ORDER — SODIUM CHLORIDE 0.9 % IV BOLUS (SEPSIS)
1000.0000 mL | Freq: Once | INTRAVENOUS | Status: AC
  Administered 2016-09-06: 1000 mL via INTRAVENOUS

## 2016-09-06 MED ORDER — SODIUM CHLORIDE 0.9 % IV SOLN
INTRAVENOUS | Status: DC
  Administered 2016-09-06: 4.2 [IU]/h via INTRAVENOUS
  Filled 2016-09-06: qty 2.5

## 2016-09-06 MED ORDER — CARVEDILOL 12.5 MG PO TABS: 6.2500 mg | ORAL_TABLET | Freq: Two times a day (BID) | ORAL | Status: DC

## 2016-09-06 MED ORDER — ACETAMINOPHEN 325 MG PO TABS: 650.0000 mg | ORAL_TABLET | Freq: Four times a day (QID) | ORAL | Status: DC | PRN

## 2016-09-06 MED ORDER — HYDROCODONE-ACETAMINOPHEN 5-325 MG PO TABS
1.0000 | ORAL_TABLET | ORAL | Status: DC | PRN
  Administered 2016-09-08: 1 via ORAL
  Filled 2016-09-06: qty 1

## 2016-09-06 MED ORDER — ONDANSETRON HCL 4 MG/2ML IJ SOLN: 4.0000 mg | Freq: Four times a day (QID) | INTRAMUSCULAR | Status: DC | PRN

## 2016-09-06 MED ORDER — SODIUM CHLORIDE 0.9 % IV SOLN
INTRAVENOUS | Status: DC
  Administered 2016-09-06: 20:00:00 via INTRAVENOUS

## 2016-09-06 MED ORDER — SODIUM CHLORIDE 0.9% FLUSH
3.0000 mL | Freq: Two times a day (BID) | INTRAVENOUS | Status: DC
  Administered 2016-09-07: 3 mL via INTRAVENOUS

## 2016-09-06 MED ORDER — ONDANSETRON HCL 4 MG PO TABS: 4.0000 mg | ORAL_TABLET | Freq: Four times a day (QID) | ORAL | Status: DC | PRN

## 2016-09-06 MED ORDER — AMLODIPINE BESYLATE 5 MG PO TABS
5.0000 mg | ORAL_TABLET | Freq: Every day | ORAL | Status: DC
  Administered 2016-09-07 – 2016-09-08 (×3): 5 mg via ORAL
  Filled 2016-09-06 (×3): qty 1

## 2016-09-06 MED ORDER — DEXTROSE-NACL 5-0.45 % IV SOLN: INTRAVENOUS | Status: DC

## 2016-09-06 MED ORDER — HYDRALAZINE HCL 20 MG/ML IJ SOLN: 20.0000 mg | Freq: Four times a day (QID) | INTRAMUSCULAR | Status: DC | PRN

## 2016-09-06 MED ORDER — INSULIN ASPART 100 UNIT/ML ~~LOC~~ SOLN
0.0000 [IU] | Freq: Every day | SUBCUTANEOUS | Status: DC
  Administered 2016-09-07: 3 [IU] via SUBCUTANEOUS
  Administered 2016-09-07: 4 [IU] via SUBCUTANEOUS

## 2016-09-06 MED ORDER — ROSUVASTATIN CALCIUM 10 MG PO TABS
20.0000 mg | ORAL_TABLET | Freq: Every day | ORAL | Status: DC
  Administered 2016-09-07 – 2016-09-08 (×2): 20 mg via ORAL
  Filled 2016-09-06 (×3): qty 2

## 2016-09-06 MED ORDER — SODIUM CHLORIDE 0.9 % IV BOLUS (SEPSIS)
500.0000 mL | Freq: Once | INTRAVENOUS | Status: AC
  Administered 2016-09-06: 500 mL via INTRAVENOUS

## 2016-09-06 MED ORDER — INSULIN REGULAR BOLUS VIA INFUSION
0.0000 [IU] | Freq: Three times a day (TID) | INTRAVENOUS | Status: DC
Start: 2016-09-07 — End: 2016-09-06
  Filled 2016-09-06: qty 10

## 2016-09-06 MED ORDER — AMLODIPINE BESYLATE 5 MG PO TABS: 5.0000 mg | ORAL_TABLET | Freq: Every day | ORAL | Status: DC

## 2016-09-06 MED ORDER — INSULIN DETEMIR 100 UNIT/ML ~~LOC~~ SOLN
15.0000 [IU] | Freq: Two times a day (BID) | SUBCUTANEOUS | Status: DC
  Administered 2016-09-06 – 2016-09-08 (×4): 15 [IU] via SUBCUTANEOUS
  Filled 2016-09-06 (×5): qty 0.15

## 2016-09-06 MED ORDER — INSULIN ASPART 100 UNIT/ML ~~LOC~~ SOLN
3.0000 [IU] | Freq: Three times a day (TID) | SUBCUTANEOUS | Status: DC
  Administered 2016-09-07: 3 [IU] via SUBCUTANEOUS

## 2016-09-06 MED ORDER — INSULIN ASPART 100 UNIT/ML ~~LOC~~ SOLN
0.0000 [IU] | Freq: Three times a day (TID) | SUBCUTANEOUS | Status: DC
  Administered 2016-09-07: 3 [IU] via SUBCUTANEOUS
  Administered 2016-09-07: 5 [IU] via SUBCUTANEOUS
  Administered 2016-09-08 (×2): 2 [IU] via SUBCUTANEOUS

## 2016-09-06 MED ORDER — SODIUM CHLORIDE 0.9 % IV SOLN
INTRAVENOUS | Status: DC
  Administered 2016-09-06 – 2016-09-07 (×3): via INTRAVENOUS
  Administered 2016-09-08 (×2): 100 mL/h via INTRAVENOUS
  Administered 2016-09-08: 02:00:00 via INTRAVENOUS

## 2016-09-06 MED ORDER — CARVEDILOL 12.5 MG PO TABS
6.2500 mg | ORAL_TABLET | Freq: Two times a day (BID) | ORAL | Status: DC
  Administered 2016-09-07 – 2016-09-08 (×5): 6.25 mg via ORAL
  Filled 2016-09-06 (×6): qty 1
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
## 2016-09-06 NOTE — Progress Notes (Signed)
   09/06/16 2319  Vitals  Temp 98.3 F (36.8 C)  Temp Source Oral  BP (!) 221/99 (Magdalyn Arenivas notified)  BP Location Right Arm  BP Method Automatic  Patient Position (if appropriate) Sitting  Pulse Rate 92  Pulse Rate Source Dinamap  Resp 16  Oxygen Therapy  SpO2 99 %  O2 Device Room Air  Height and Weight  Height 5\' 2"  (1.575 m)  Weight 80.1 kg (176 lb 8 oz) (scale c)  Type of Scale Used Standing  BSA (Calculated - sq m) 1.87 sq meters  BMI (Calculated) 32.3  Weight in (lb) to have BMI = 25 136.4  Admitted pt to rm 3E11 from ED, pt alert and oriented, denied pain at this time, oriented to room, call bell placed within reach, placed on cardiac monitor, CCMD  made aware. Pt's blood pressure is elevated, orthostatic vital signs done per order. Dr. Eulas Post notified and ordered home BP meds of patients to start now. Will continue to monitor.

## 2016-09-06 NOTE — ED Notes (Signed)
Arrived to [facility] with patient.  Received call in hallway stating patient room has been changed to [facility].  Called and spoke with charge nurse on [place] to see if I could give bedside report.  Per charge nurse ok to give bedside report.  Patient taken to [place] room 311 at this time.  Bedside report given to nurse.  Encouraged to call for any questions.

## 2016-09-06 NOTE — ED Notes (Signed)
Patient transported to CT 

## 2016-09-06 NOTE — ED Notes (Signed)
Pt had episode of urinary incontinence with EMS. Pt states she has not been taking her insulin and has since had episodes of losing control of her bladder. Removed pt clothing and cleaned up pt.
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
## 2016-09-07 ENCOUNTER — Encounter (HOSPITAL_COMMUNITY): Payer: Self-pay | Admitting: General Practice

## 2016-09-07 DIAGNOSIS — E1142 Type 2 diabetes mellitus with diabetic polyneuropathy: Secondary | ICD-10-CM | POA: Diagnosis present

## 2016-09-07 DIAGNOSIS — R55 Syncope and collapse: Secondary | ICD-10-CM

## 2016-09-07 DIAGNOSIS — F4320 Adjustment disorder, unspecified: Secondary | ICD-10-CM | POA: Diagnosis present

## 2016-09-07 DIAGNOSIS — Z794 Long term (current) use of insulin: Secondary | ICD-10-CM

## 2016-09-07 DIAGNOSIS — E0844 Diabetes mellitus due to underlying condition with diabetic amyotrophy: Secondary | ICD-10-CM

## 2016-09-07 DIAGNOSIS — Z9114 Patient's other noncompliance with medication regimen: Secondary | ICD-10-CM | POA: Diagnosis not present

## 2016-09-07 DIAGNOSIS — Z833 Family history of diabetes mellitus: Secondary | ICD-10-CM | POA: Diagnosis not present

## 2016-09-07 DIAGNOSIS — F329 Major depressive disorder, single episode, unspecified: Secondary | ICD-10-CM | POA: Diagnosis present

## 2016-09-07 DIAGNOSIS — Y92410 Unspecified street and highway as the place of occurrence of the external cause: Secondary | ICD-10-CM | POA: Diagnosis not present

## 2016-09-07 DIAGNOSIS — E785 Hyperlipidemia, unspecified: Secondary | ICD-10-CM | POA: Diagnosis present

## 2016-09-07 DIAGNOSIS — E860 Dehydration: Secondary | ICD-10-CM

## 2016-09-07 DIAGNOSIS — N179 Acute kidney failure, unspecified: Secondary | ICD-10-CM | POA: Diagnosis present

## 2016-09-07 DIAGNOSIS — R809 Proteinuria, unspecified: Secondary | ICD-10-CM | POA: Diagnosis present

## 2016-09-07 DIAGNOSIS — T383X6A Underdosing of insulin and oral hypoglycemic [antidiabetic] drugs, initial encounter: Secondary | ICD-10-CM | POA: Diagnosis present

## 2016-09-07 DIAGNOSIS — E1165 Type 2 diabetes mellitus with hyperglycemia: Secondary | ICD-10-CM

## 2016-09-07 DIAGNOSIS — E1100 Type 2 diabetes mellitus with hyperosmolarity without nonketotic hyperglycemic-hyperosmolar coma (NKHHC): Principal | ICD-10-CM

## 2016-09-07 DIAGNOSIS — Z79891 Long term (current) use of opiate analgesic: Secondary | ICD-10-CM | POA: Diagnosis not present

## 2016-09-07 DIAGNOSIS — Z87891 Personal history of nicotine dependence: Secondary | ICD-10-CM | POA: Diagnosis not present

## 2016-09-07 DIAGNOSIS — I10 Essential (primary) hypertension: Secondary | ICD-10-CM

## 2016-09-07 DIAGNOSIS — Z9119 Patient's noncompliance with other medical treatment and regimen: Secondary | ICD-10-CM | POA: Diagnosis not present

## 2016-09-07 DIAGNOSIS — E669 Obesity, unspecified: Secondary | ICD-10-CM | POA: Diagnosis present

## 2016-09-07 DIAGNOSIS — Z79899 Other long term (current) drug therapy: Secondary | ICD-10-CM | POA: Diagnosis not present

## 2016-09-07 DIAGNOSIS — Z6832 Body mass index (BMI) 32.0-32.9, adult: Secondary | ICD-10-CM | POA: Diagnosis not present

## 2016-09-07 DIAGNOSIS — Z91128 Patient's intentional underdosing of medication regimen for other reason: Secondary | ICD-10-CM | POA: Diagnosis not present

## 2016-09-07 LAB — COMPREHENSIVE METABOLIC PANEL
ALT: 15 U/L (ref 14–54)
AST: 18 U/L (ref 15–41)
Albumin: 2.6 g/dL — ABNORMAL LOW (ref 3.5–5.0)
Alkaline Phosphatase: 77 U/L (ref 38–126)
Anion gap: 7 (ref 5–15)
BUN: 17 mg/dL (ref 6–20)
CO2: 24 mmol/L (ref 22–32)
Calcium: 8.3 mg/dL — ABNORMAL LOW (ref 8.9–10.3)
Chloride: 109 mmol/L (ref 101–111)
Creatinine, Ser: 1.3 mg/dL — ABNORMAL HIGH (ref 0.44–1.00)
GFR calc Af Amer: 48 mL/min — ABNORMAL LOW (ref >60)
GFR calc non Af Amer: 42 mL/min — ABNORMAL LOW (ref >60)
Glucose, Bld: 187 mg/dL — ABNORMAL HIGH (ref 65–99)
Potassium: 3.5 mmol/L (ref 3.5–5.1)
Sodium: 140 mmol/L (ref 135–145)
Total Bilirubin: 0.5 mg/dL (ref 0.3–1.2)
Total Protein: 5.5 g/dL — ABNORMAL LOW (ref 6.5–8.1)

## 2016-09-07 LAB — PROTEIN / CREATININE RATIO, URINE
Creatinine, Urine: 168.79 mg/dL
Protein Creatinine Ratio: 2.91 mg/mg{Cre} — ABNORMAL HIGH (ref 0.00–0.15)
Total Protein, Urine: 492 mg/dL

## 2016-09-07 LAB — RAPID URINE DRUG SCREEN, HOSP PERFORMED
Amphetamines: NOT DETECTED
Barbiturates: NOT DETECTED
Benzodiazepines: NOT DETECTED
Cocaine: NOT DETECTED
Opiates: NOT DETECTED
Tetrahydrocannabinol: NOT DETECTED

## 2016-09-07 LAB — GLUCOSE, CAPILLARY
Glucose-Capillary: 152 mg/dL — ABNORMAL HIGH (ref 65–99)
Glucose-Capillary: 75 mg/dL (ref 65–99)
Glucose-Capillary: 206 mg/dL — ABNORMAL HIGH (ref 65–99)
Glucose-Capillary: 298 mg/dL — ABNORMAL HIGH (ref 65–99)

## 2016-09-07 LAB — CBC
HCT: 32.1 % — ABNORMAL LOW (ref 36.0–46.0)
Hemoglobin: 10.7 g/dL — ABNORMAL LOW (ref 12.0–15.0)
MCH: 27.8 pg (ref 26.0–34.0)
MCHC: 33.3 g/dL (ref 30.0–36.0)
MCV: 83.4 fL (ref 78.0–100.0)
Platelets: 246 10*3/uL (ref 150–400)
RBC: 3.85 MIL/uL — ABNORMAL LOW (ref 3.87–5.11)
RDW: 12.8 % (ref 11.5–15.5)
WBC: 8.8 10*3/uL (ref 4.0–10.5)

## 2016-09-07 MED ORDER — ENOXAPARIN SODIUM 40 MG/0.4ML ~~LOC~~ SOLN
40.0000 mg | SUBCUTANEOUS | Status: DC
  Administered 2016-09-07 – 2016-09-08 (×2): 40 mg via SUBCUTANEOUS
  Filled 2016-09-07: qty 0.4
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
## 2016-09-07 NOTE — Progress Notes (Signed)
Patient up to the chair most of the shift. No complaint of pain, vital signs are stable. No syncopal episodes noted. Patient readily admits that she was not taking her medications. RN and patient talked at length about the importance of taking medication as prescribed as well as other lifestyle changes that she could make to help her feel better. No questions or concerns at this time, call bell in reach, will continue to monitor.
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
## 2016-09-08 ENCOUNTER — Inpatient Hospital Stay (HOSPITAL_COMMUNITY): Payer: No Typology Code available for payment source

## 2016-09-08 DIAGNOSIS — R55 Syncope and collapse: Secondary | ICD-10-CM

## 2016-09-08 LAB — GLUCOSE, CAPILLARY
Glucose-Capillary: 148 mg/dL — ABNORMAL HIGH (ref 65–99)
Glucose-Capillary: 122 mg/dL — ABNORMAL HIGH (ref 65–99)
Glucose-Capillary: 93 mg/dL (ref 65–99)

## 2016-09-08 LAB — BASIC METABOLIC PANEL
Anion gap: 5 (ref 5–15)
BUN: 18 mg/dL (ref 6–20)
CO2: 24 mmol/L (ref 22–32)
Calcium: 8.5 mg/dL — ABNORMAL LOW (ref 8.9–10.3)
Chloride: 110 mmol/L (ref 101–111)
Creatinine, Ser: 1.1 mg/dL — ABNORMAL HIGH (ref 0.44–1.00)
GFR calc Af Amer: 59 mL/min — ABNORMAL LOW (ref >60)
GFR calc non Af Amer: 51 mL/min — ABNORMAL LOW (ref >60)
Glucose, Bld: 175 mg/dL — ABNORMAL HIGH (ref 65–99)
Potassium: 3.7 mmol/L (ref 3.5–5.1)
Sodium: 139 mmol/L (ref 135–145)

## 2016-09-08 LAB — ECHOCARDIOGRAM COMPLETE
E decel time: 225 msec
E/e' ratio: 11.27
FS: 27 % — AB (ref 28–44)
Height: 62 in
IVS/LV PW RATIO, ED: 0.77
LA ID, A-P, ES: 35 mm
LA diam end sys: 35 mm
LA diam index: 1.9 cm/m2
LA vol A4C: 43.3 ml
LA vol index: 25.7 mL/m2
LA vol: 47.3 mL
LV E/e' medial: 11.27
LV E/e'average: 11.27
LV PW d: 10.5 mm — AB (ref 0.6–1.1)
LV e' LATERAL: 8.05 cm/s
LVOT area: 2.54 cm2
LVOT diameter: 18 mm
Lateral S' vel: 13.5 cm/s
MV Dec: 225
MV Peak grad: 3 mmHg
MV pk A vel: 81.5 m/s
MV pk E vel: 90.7 m/s
TAPSE: 18.7 mm
TDI e' lateral: 8.05
TDI e' medial: 5.66
Weight: 2908.8 oz

## 2016-09-08 LAB — MAGNESIUM: Magnesium: 1.7 mg/dL (ref 1.7–2.4)

## 2016-09-08 LAB — HEMOGLOBIN A1C
Hgb A1c MFr Bld: >15.5 % — ABNORMAL HIGH (ref 4.8–5.6)
Mean Plasma Glucose: >398 mg/dL

## 2016-09-08 MED ORDER — MAGNESIUM SULFATE 2 GM/50ML IV SOLN
2.0000 g | Freq: Once | INTRAVENOUS | Status: AC
  Administered 2016-09-08: 2 g via INTRAVENOUS
  Filled 2016-09-08: qty 50

## 2016-09-08 MED ORDER — ROSUVASTATIN CALCIUM 20 MG PO TABS: 20.0000 mg | ORAL_TABLET | Freq: Every day | ORAL | 1 refills | Status: DC

## 2016-09-08 MED ORDER — INSULIN PEN NEEDLE 32G X 4 MM MISC: 1 refills | Status: DC

## 2016-09-08 MED ORDER — INSULIN DETEMIR 100 UNIT/ML FLEXPEN: 30.0000 [IU] | PEN_INJECTOR | Freq: Every day | SUBCUTANEOUS | 1 refills | Status: DC

## 2016-09-08 MED ORDER — ASPIRIN EC 81 MG PO TBEC: 81.0000 mg | DELAYED_RELEASE_TABLET | Freq: Every day | ORAL | 0 refills | Status: DC

## 2016-09-08 MED ORDER — CARVEDILOL 6.25 MG PO TABS: 6.2500 mg | ORAL_TABLET | Freq: Two times a day (BID) | ORAL | 1 refills | Status: DC

## 2016-09-08 MED ORDER — LISINOPRIL 5 MG PO TABS: 5.0000 mg | ORAL_TABLET | Freq: Every day | ORAL | 1 refills | Status: DC

## 2016-09-08 MED ORDER — AMLODIPINE BESYLATE 5 MG PO TABS: 5.0000 mg | ORAL_TABLET | Freq: Every day | ORAL | 1 refills | Status: DC

## 2016-09-08 NOTE — Progress Notes (Signed)
   09/08/16 0546  Vitals  Temp 98.8 F (37.1 C)  Temp Source Oral  BP (!) 193/87  BP Location Right Arm  BP Method Automatic  Patient Position (if appropriate) Lying  Pulse Rate 87  Pulse Rate Source Dinamap  Resp 18  Oxygen Therapy  SpO2 97 %  O2 Device Room Air  Height and Weight  Weight 82.5 kg (181 lb 12.8 oz) (Scale C)  Type of Scale Used Standing  Type of Weight Actual  Pt's BP elevated, pt resting in bed. Rechecked BP @ 0613 BP=187/82. K. Schorr NP notified, and ordered to give pt's AM dose of coreg 6.25mg  and recheck BP in an hour.
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
## 2016-09-08 NOTE — Progress Notes (Signed)
Reviewed all discharge instructions with patient and her granddaughter.  Both stated understanding of medication list, prescriptions, follow-up appointment, diet, and activity.  No voiced complaints.

## 2016-09-08 NOTE — Progress Notes (Signed)
Bedside 2-D Echo in progress

## 2016-09-08 NOTE — Progress Notes (Signed)
  Echocardiogram 2D Echocardiogram has been performed.  [name] 09/08/2016, 4:21 PM
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
## 2016-09-08 NOTE — Progress Notes (Signed)
Current b/p = 133/69, p = 73.  8am Coreg given early per MD instructions to night shift RN for elevated b/p @ [phone]
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
## 2016-09-08 NOTE — Progress Notes (Signed)
Inpatient Diabetes Program Recommendations  AACE/ADA: New Consensus Statement on Inpatient Glycemic Control (2015)  Target Ranges:  Prepandial:   less than 140 mg/dL      Peak postprandial:   less than 180 mg/dL (1-2 hours)      Critically ill patients:  140 - 180 mg/dL   Lab Results  Component Value Date   GLUCAP 148 (H) 09/08/2016   HGBA1C >15.5 (H) 09/07/2016    Review of Glycemic Control Results for Melissa Howe, Melissa Howe (MRN [id number]) as of 09/08/2016 12:28  Ref. Range 09/07/2016 05:45 09/07/2016 11:45 09/07/2016 17:00 09/07/2016 20:49 09/08/2016 05:56  Glucose-Capillary Latest Ref Range: 65 - 99 mg/dL 152 (H) 75 206 (H) 298 (H) 148 (H)   Diabetes history: DM2 Outpatient Diabetes medications: Levemir 30 QHS + Metformin 1000 BID (pt admits to not taking meds for 2 months) Current orders for Inpatient glycemic control: Levemir 15 units qd+ Novolog correction 0-15 units tid + 0-5 units hs  Inpatient Diabetes Program Recommendations:  Spoke with patient @ bedside. Patient shared that she quit taking her meds because "I just got tired of taking". Patient states she is comfortable giving herself injections and denies cost or other factors hindering taking her medications.Shared information with case manager. Reviewed with patient risks of elevated A1c and blood glucose.   Thank you, Nani Gasser. Elion Hocker, RN, MSN, CDE Inpatient Glycemic Control Team Team Pager [phone] (8am-5pm) 09/08/2016 12:34 PM

## 2016-09-09 LAB — HEMOGLOBIN A1C
Hgb A1c MFr Bld: >15.5 % — ABNORMAL HIGH (ref 4.8–5.6)
Mean Plasma Glucose: >398 mg/dL

## 2016-10-24 ENCOUNTER — Encounter (HOSPITAL_BASED_OUTPATIENT_CLINIC_OR_DEPARTMENT_OTHER): Payer: Self-pay | Admitting: *Deleted

## 2016-10-24 ENCOUNTER — Emergency Department (HOSPITAL_BASED_OUTPATIENT_CLINIC_OR_DEPARTMENT_OTHER)
Admission: EM | Admit: 2016-10-24 | Discharge: 2016-10-24 | Disposition: A | Discharge status: Home / Self Care | Payer: Medicare Other | Attending: Emergency Medicine | Admitting: Emergency Medicine

## 2016-10-24 DIAGNOSIS — E119 Type 2 diabetes mellitus without complications: Secondary | ICD-10-CM | POA: Insufficient documentation

## 2016-10-24 DIAGNOSIS — R1084 Generalized abdominal pain: Secondary | ICD-10-CM | POA: Diagnosis not present

## 2016-10-24 DIAGNOSIS — R110 Nausea: Secondary | ICD-10-CM | POA: Diagnosis not present

## 2016-10-24 DIAGNOSIS — I10 Essential (primary) hypertension: Secondary | ICD-10-CM | POA: Insufficient documentation

## 2016-10-24 DIAGNOSIS — Z794 Long term (current) use of insulin: Secondary | ICD-10-CM | POA: Diagnosis not present

## 2016-10-24 DIAGNOSIS — K5900 Constipation, unspecified: Secondary | ICD-10-CM | POA: Diagnosis not present

## 2016-10-24 DIAGNOSIS — Z79899 Other long term (current) drug therapy: Secondary | ICD-10-CM | POA: Insufficient documentation

## 2016-10-24 DIAGNOSIS — Z7982 Long term (current) use of aspirin: Secondary | ICD-10-CM | POA: Insufficient documentation

## 2016-10-24 DIAGNOSIS — Z87891 Personal history of nicotine dependence: Secondary | ICD-10-CM | POA: Diagnosis not present

## 2016-10-24 NOTE — Discharge Instructions (Signed)
Please increase your use of miralax every 3 hours until you have a bowel movement. I would also like for you to try a glycerin suppository that you can buy over the counter. Please follow up with your primary care doctor at your appointment next week. You also need to return to the Ed if you start vomiting, abdomen becomes hard and distended, blood in your stool, fevers, or worsening abdominal pain. Also need to follow up with your blood pressure at your doctors appointment. Return if you develop chest pain, or shortness of breath.

## 2016-10-24 NOTE — ED Notes (Signed)
Pt states she has been constipated off and on x 4 weeks. Was able to have a medium sized bm last night after giving herself an enema. Pt states she takes stool softeners, miralax, and laxatives daily with very little results. States she has not seen a gi specialist, that she has only seen her pcp so far for this problem.

## 2016-10-24 NOTE — ED Triage Notes (Signed)
Constipation. No BM x 4 weeks. She used an enema, is taking stool softeners and taking Miralax.
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
## 2016-10-24 NOTE — ED Notes (Signed)
Pa chaperoned for rectal exam, pt tolerated well.

## 2016-10-24 NOTE — ED Notes (Signed)
ED Provider at bedside. 

## 2017-08-03 ENCOUNTER — Encounter (HOSPITAL_BASED_OUTPATIENT_CLINIC_OR_DEPARTMENT_OTHER): Payer: Self-pay | Admitting: *Deleted

## 2017-08-03 ENCOUNTER — Emergency Department (HOSPITAL_BASED_OUTPATIENT_CLINIC_OR_DEPARTMENT_OTHER)
Admission: EM | Admit: 2017-08-03 | Discharge: 2017-08-03 | Disposition: A | Discharge status: Home / Self Care | Payer: Medicare Other | Attending: Emergency Medicine | Admitting: Emergency Medicine

## 2017-08-03 DIAGNOSIS — Z7982 Long term (current) use of aspirin: Secondary | ICD-10-CM | POA: Insufficient documentation

## 2017-08-03 DIAGNOSIS — Z87891 Personal history of nicotine dependence: Secondary | ICD-10-CM | POA: Insufficient documentation

## 2017-08-03 DIAGNOSIS — Z794 Long term (current) use of insulin: Secondary | ICD-10-CM | POA: Diagnosis not present

## 2017-08-03 DIAGNOSIS — I10 Essential (primary) hypertension: Secondary | ICD-10-CM | POA: Insufficient documentation

## 2017-08-03 DIAGNOSIS — Z79899 Other long term (current) drug therapy: Secondary | ICD-10-CM | POA: Insufficient documentation

## 2017-08-03 DIAGNOSIS — E1140 Type 2 diabetes mellitus with diabetic neuropathy, unspecified: Secondary | ICD-10-CM | POA: Insufficient documentation

## 2017-08-03 DIAGNOSIS — L2089 Other atopic dermatitis: Secondary | ICD-10-CM | POA: Insufficient documentation

## 2017-08-03 DIAGNOSIS — R21 Rash and other nonspecific skin eruption: Secondary | ICD-10-CM | POA: Diagnosis present

## 2017-08-03 MED ORDER — TRIAMCINOLONE ACETONIDE 0.025 % EX OINT: 1.0000 "application " | TOPICAL_OINTMENT | Freq: Two times a day (BID) | CUTANEOUS | 0 refills | Status: DC
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
## 2017-08-03 NOTE — ED Notes (Signed)
Spoke with pt about making a follow up appointment to her PMD to have BP rechecked. PT denies HA, blurred vision, chest pain and dizziness. Given parameters to return to ED if needed.

## 2017-10-27 DIAGNOSIS — J189 Pneumonia, unspecified organism: Secondary | ICD-10-CM

## 2017-10-27 HISTORY — DX: Pneumonia, unspecified organism: J18.9

## 2017-11-19 ENCOUNTER — Encounter: Payer: Self-pay | Admitting: Specialist

## 2018-02-11 ENCOUNTER — Emergency Department (HOSPITAL_BASED_OUTPATIENT_CLINIC_OR_DEPARTMENT_OTHER): Payer: Medicare Other

## 2018-02-11 ENCOUNTER — Emergency Department (HOSPITAL_COMMUNITY)
Admission: EM | Admit: 2018-02-11 | Discharge: 2018-02-11 | Disposition: A | Discharge status: Home / Self Care | Payer: Medicare Other

## 2018-02-11 ENCOUNTER — Inpatient Hospital Stay (HOSPITAL_BASED_OUTPATIENT_CLINIC_OR_DEPARTMENT_OTHER)
Admission: EM | Admit: 2018-02-11 | Discharge: 2018-02-17 | DRG: 194 | Disposition: A | Discharge status: Home / Self Care | Payer: Medicare Other | Attending: Internal Medicine | Admitting: Internal Medicine

## 2018-02-11 ENCOUNTER — Other Ambulatory Visit: Payer: Self-pay

## 2018-02-11 ENCOUNTER — Encounter (HOSPITAL_BASED_OUTPATIENT_CLINIC_OR_DEPARTMENT_OTHER): Payer: Self-pay | Admitting: *Deleted

## 2018-02-11 DIAGNOSIS — N183 Chronic kidney disease, stage 3 (moderate): Secondary | ICD-10-CM | POA: Diagnosis present

## 2018-02-11 DIAGNOSIS — R0602 Shortness of breath: Secondary | ICD-10-CM

## 2018-02-11 DIAGNOSIS — E1122 Type 2 diabetes mellitus with diabetic chronic kidney disease: Secondary | ICD-10-CM | POA: Diagnosis present

## 2018-02-11 DIAGNOSIS — I5033 Acute on chronic diastolic (congestive) heart failure: Secondary | ICD-10-CM

## 2018-02-11 DIAGNOSIS — D649 Anemia, unspecified: Secondary | ICD-10-CM | POA: Diagnosis not present

## 2018-02-11 DIAGNOSIS — Z6829 Body mass index (BMI) 29.0-29.9, adult: Secondary | ICD-10-CM

## 2018-02-11 DIAGNOSIS — Z7982 Long term (current) use of aspirin: Secondary | ICD-10-CM

## 2018-02-11 DIAGNOSIS — R0989 Other specified symptoms and signs involving the circulatory and respiratory systems: Secondary | ICD-10-CM

## 2018-02-11 DIAGNOSIS — R0600 Dyspnea, unspecified: Secondary | ICD-10-CM | POA: Diagnosis not present

## 2018-02-11 DIAGNOSIS — Y92238 Other place in hospital as the place of occurrence of the external cause: Secondary | ICD-10-CM | POA: Diagnosis present

## 2018-02-11 DIAGNOSIS — Z8249 Family history of ischemic heart disease and other diseases of the circulatory system: Secondary | ICD-10-CM

## 2018-02-11 DIAGNOSIS — Z79899 Other long term (current) drug therapy: Secondary | ICD-10-CM

## 2018-02-11 DIAGNOSIS — Z9049 Acquired absence of other specified parts of digestive tract: Secondary | ICD-10-CM

## 2018-02-11 DIAGNOSIS — R7881 Bacteremia: Secondary | ICD-10-CM | POA: Diagnosis present

## 2018-02-11 DIAGNOSIS — E785 Hyperlipidemia, unspecified: Secondary | ICD-10-CM | POA: Diagnosis present

## 2018-02-11 DIAGNOSIS — Z794 Long term (current) use of insulin: Secondary | ICD-10-CM

## 2018-02-11 DIAGNOSIS — Z9089 Acquired absence of other organs: Secondary | ICD-10-CM

## 2018-02-11 DIAGNOSIS — Z833 Family history of diabetes mellitus: Secondary | ICD-10-CM

## 2018-02-11 DIAGNOSIS — T508X5A Adverse effect of diagnostic agents, initial encounter: Secondary | ICD-10-CM | POA: Diagnosis present

## 2018-02-11 DIAGNOSIS — I129 Hypertensive chronic kidney disease with stage 1 through stage 4 chronic kidney disease, or unspecified chronic kidney disease: Secondary | ICD-10-CM | POA: Diagnosis present

## 2018-02-11 DIAGNOSIS — Z87440 Personal history of urinary (tract) infections: Secondary | ICD-10-CM

## 2018-02-11 DIAGNOSIS — Z9851 Tubal ligation status: Secondary | ICD-10-CM

## 2018-02-11 DIAGNOSIS — N179 Acute kidney failure, unspecified: Secondary | ICD-10-CM | POA: Diagnosis present

## 2018-02-11 DIAGNOSIS — J181 Lobar pneumonia, unspecified organism: Principal | ICD-10-CM | POA: Diagnosis present

## 2018-02-11 DIAGNOSIS — N141 Nephropathy induced by other drugs, medicaments and biological substances: Secondary | ICD-10-CM | POA: Diagnosis present

## 2018-02-11 DIAGNOSIS — J189 Pneumonia, unspecified organism: Secondary | ICD-10-CM

## 2018-02-11 DIAGNOSIS — Z23 Encounter for immunization: Secondary | ICD-10-CM

## 2018-02-11 DIAGNOSIS — E113219 Type 2 diabetes mellitus with mild nonproliferative diabetic retinopathy with macular edema, unspecified eye: Secondary | ICD-10-CM | POA: Diagnosis present

## 2018-02-11 DIAGNOSIS — M25562 Pain in left knee: Secondary | ICD-10-CM | POA: Diagnosis present

## 2018-02-11 DIAGNOSIS — Z87891 Personal history of nicotine dependence: Secondary | ICD-10-CM

## 2018-02-11 DIAGNOSIS — E669 Obesity, unspecified: Secondary | ICD-10-CM | POA: Diagnosis present

## 2018-02-11 DIAGNOSIS — Z9114 Patient's other noncompliance with medication regimen: Secondary | ICD-10-CM

## 2018-02-11 DIAGNOSIS — E119 Type 2 diabetes mellitus without complications: Secondary | ICD-10-CM

## 2018-02-11 DIAGNOSIS — G8929 Other chronic pain: Secondary | ICD-10-CM | POA: Diagnosis present

## 2018-02-11 DIAGNOSIS — E1140 Type 2 diabetes mellitus with diabetic neuropathy, unspecified: Secondary | ICD-10-CM | POA: Diagnosis present

## 2018-02-11 DIAGNOSIS — R000 Tachycardia, unspecified: Secondary | ICD-10-CM | POA: Diagnosis present

## 2018-02-11 LAB — BASIC METABOLIC PANEL
Anion gap: 10 (ref 5–15)
BUN: 27 mg/dL — ABNORMAL HIGH (ref 6–20)
CO2: 20 mmol/L — ABNORMAL LOW (ref 22–32)
Calcium: 8.4 mg/dL — ABNORMAL LOW (ref 8.9–10.3)
Chloride: 103 mmol/L (ref 101–111)
Creatinine, Ser: 1.73 mg/dL — ABNORMAL HIGH (ref 0.44–1.00)
GFR calc Af Amer: 34 mL/min — ABNORMAL LOW (ref >60)
GFR calc non Af Amer: 29 mL/min — ABNORMAL LOW (ref >60)
Glucose, Bld: 281 mg/dL — ABNORMAL HIGH (ref 65–99)
Potassium: 4.2 mmol/L (ref 3.5–5.1)
Sodium: 133 mmol/L — ABNORMAL LOW (ref 135–145)

## 2018-02-11 LAB — CBC WITH DIFFERENTIAL/PLATELET
Basophils Absolute: 0 10*3/uL (ref 0.0–0.1)
Basophils Relative: 0 %
Eosinophils Absolute: 0.5 10*3/uL (ref 0.0–0.7)
Eosinophils Relative: 4 %
HCT: 27.6 % — ABNORMAL LOW (ref 36.0–46.0)
Hemoglobin: 9.3 g/dL — ABNORMAL LOW (ref 12.0–15.0)
Lymphocytes Relative: 12 %
Lymphs Abs: 1.6 10*3/uL (ref 0.7–4.0)
MCH: 28.6 pg (ref 26.0–34.0)
MCHC: 33.7 g/dL (ref 30.0–36.0)
MCV: 84.9 fL (ref 78.0–100.0)
Monocytes Absolute: 1.2 10*3/uL — ABNORMAL HIGH (ref 0.1–1.0)
Monocytes Relative: 8 %
Neutro Abs: 10.7 10*3/uL — ABNORMAL HIGH (ref 1.7–7.7)
Neutrophils Relative %: 76 %
Platelets: 325 10*3/uL (ref 150–400)
RBC: 3.25 MIL/uL — ABNORMAL LOW (ref 3.87–5.11)
RDW: 13.5 % (ref 11.5–15.5)
WBC: 14 10*3/uL — ABNORMAL HIGH (ref 4.0–10.5)

## 2018-02-11 LAB — D-DIMER, QUANTITATIVE: D-Dimer, Quant: 1.21 ug/mL-FEU — ABNORMAL HIGH (ref 0.00–0.50)

## 2018-02-11 LAB — BRAIN NATRIURETIC PEPTIDE: B Natriuretic Peptide: 267.5 pg/mL — ABNORMAL HIGH (ref 0.0–100.0)

## 2018-02-11 NOTE — ED Triage Notes (Signed)
Cough x 2 months. Her MD told her she needs an inhaler but pt never got the Rx filled.
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
## 2018-02-12 ENCOUNTER — Emergency Department (HOSPITAL_BASED_OUTPATIENT_CLINIC_OR_DEPARTMENT_OTHER): Payer: Medicare Other

## 2018-02-12 DIAGNOSIS — I5033 Acute on chronic diastolic (congestive) heart failure: Secondary | ICD-10-CM

## 2018-02-12 DIAGNOSIS — E1122 Type 2 diabetes mellitus with diabetic chronic kidney disease: Secondary | ICD-10-CM | POA: Diagnosis present

## 2018-02-12 DIAGNOSIS — R0602 Shortness of breath: Secondary | ICD-10-CM | POA: Diagnosis not present

## 2018-02-12 DIAGNOSIS — E113219 Type 2 diabetes mellitus with mild nonproliferative diabetic retinopathy with macular edema, unspecified eye: Secondary | ICD-10-CM | POA: Diagnosis present

## 2018-02-12 DIAGNOSIS — Z9851 Tubal ligation status: Secondary | ICD-10-CM | POA: Diagnosis not present

## 2018-02-12 DIAGNOSIS — M25562 Pain in left knee: Secondary | ICD-10-CM | POA: Diagnosis present

## 2018-02-12 DIAGNOSIS — R0600 Dyspnea, unspecified: Secondary | ICD-10-CM | POA: Diagnosis present

## 2018-02-12 DIAGNOSIS — E785 Hyperlipidemia, unspecified: Secondary | ICD-10-CM | POA: Diagnosis present

## 2018-02-12 DIAGNOSIS — Z6829 Body mass index (BMI) 29.0-29.9, adult: Secondary | ICD-10-CM | POA: Diagnosis not present

## 2018-02-12 DIAGNOSIS — N179 Acute kidney failure, unspecified: Secondary | ICD-10-CM | POA: Diagnosis present

## 2018-02-12 DIAGNOSIS — Z87891 Personal history of nicotine dependence: Secondary | ICD-10-CM | POA: Diagnosis not present

## 2018-02-12 DIAGNOSIS — R0989 Other specified symptoms and signs involving the circulatory and respiratory systems: Secondary | ICD-10-CM | POA: Diagnosis not present

## 2018-02-12 DIAGNOSIS — J181 Lobar pneumonia, unspecified organism: Secondary | ICD-10-CM | POA: Diagnosis present

## 2018-02-12 DIAGNOSIS — Z87440 Personal history of urinary (tract) infections: Secondary | ICD-10-CM | POA: Diagnosis not present

## 2018-02-12 DIAGNOSIS — R7881 Bacteremia: Secondary | ICD-10-CM | POA: Diagnosis present

## 2018-02-12 DIAGNOSIS — J189 Pneumonia, unspecified organism: Secondary | ICD-10-CM | POA: Diagnosis not present

## 2018-02-12 DIAGNOSIS — E669 Obesity, unspecified: Secondary | ICD-10-CM | POA: Diagnosis present

## 2018-02-12 DIAGNOSIS — E1140 Type 2 diabetes mellitus with diabetic neuropathy, unspecified: Secondary | ICD-10-CM | POA: Diagnosis present

## 2018-02-12 DIAGNOSIS — N141 Nephropathy induced by other drugs, medicaments and biological substances: Secondary | ICD-10-CM | POA: Diagnosis present

## 2018-02-12 DIAGNOSIS — R000 Tachycardia, unspecified: Secondary | ICD-10-CM | POA: Diagnosis present

## 2018-02-12 DIAGNOSIS — Z9089 Acquired absence of other organs: Secondary | ICD-10-CM | POA: Diagnosis not present

## 2018-02-12 DIAGNOSIS — G8929 Other chronic pain: Secondary | ICD-10-CM | POA: Diagnosis present

## 2018-02-12 DIAGNOSIS — T508X5A Adverse effect of diagnostic agents, initial encounter: Secondary | ICD-10-CM | POA: Diagnosis present

## 2018-02-12 DIAGNOSIS — Y92238 Other place in hospital as the place of occurrence of the external cause: Secondary | ICD-10-CM | POA: Diagnosis present

## 2018-02-12 DIAGNOSIS — I129 Hypertensive chronic kidney disease with stage 1 through stage 4 chronic kidney disease, or unspecified chronic kidney disease: Secondary | ICD-10-CM | POA: Diagnosis present

## 2018-02-12 DIAGNOSIS — Z8249 Family history of ischemic heart disease and other diseases of the circulatory system: Secondary | ICD-10-CM | POA: Diagnosis not present

## 2018-02-12 DIAGNOSIS — D649 Anemia, unspecified: Secondary | ICD-10-CM | POA: Diagnosis not present

## 2018-02-12 DIAGNOSIS — Z9049 Acquired absence of other specified parts of digestive tract: Secondary | ICD-10-CM | POA: Diagnosis not present

## 2018-02-12 DIAGNOSIS — N183 Chronic kidney disease, stage 3 (moderate): Secondary | ICD-10-CM | POA: Diagnosis present

## 2018-02-12 DIAGNOSIS — Z23 Encounter for immunization: Secondary | ICD-10-CM | POA: Diagnosis present

## 2018-02-12 LAB — RESPIRATORY PANEL BY PCR

## 2018-02-12 LAB — GLUCOSE, CAPILLARY
Glucose-Capillary: 212 mg/dL — ABNORMAL HIGH (ref 65–99)
Glucose-Capillary: 198 mg/dL — ABNORMAL HIGH (ref 65–99)
Glucose-Capillary: 77 mg/dL (ref 65–99)
Glucose-Capillary: 63 mg/dL — ABNORMAL LOW (ref 65–99)

## 2018-02-12 LAB — STREP PNEUMONIAE URINARY ANTIGEN: Strep Pneumo Urinary Antigen: NEGATIVE

## 2018-02-12 LAB — INFLUENZA PANEL BY PCR (TYPE A & B)
Influenza A By PCR: NEGATIVE
Influenza B By PCR: NEGATIVE

## 2018-02-12 MED ORDER — AZITHROMYCIN 250 MG PO TABS
500.0000 mg | ORAL_TABLET | Freq: Every day | ORAL | Status: DC
  Administered 2018-02-13 – 2018-02-16 (×4): 500 mg via ORAL
  Filled 2018-02-12 (×4): qty 2

## 2018-02-12 MED ORDER — CARVEDILOL 6.25 MG PO TABS
6.2500 mg | ORAL_TABLET | Freq: Two times a day (BID) | ORAL | Status: DC
  Administered 2018-02-12 – 2018-02-17 (×11): 6.25 mg via ORAL
  Filled 2018-02-12 (×11): qty 1

## 2018-02-12 MED ORDER — SODIUM CHLORIDE 0.9 % IV SOLN
500.0000 mg | INTRAVENOUS | Status: DC
  Filled 2018-02-12: qty 500

## 2018-02-12 MED ORDER — SODIUM CHLORIDE 0.9 % IV SOLN
1.0000 g | Freq: Once | INTRAVENOUS | Status: AC
  Administered 2018-02-12: 1 g via INTRAVENOUS
  Filled 2018-02-12: qty 10

## 2018-02-12 MED ORDER — AZITHROMYCIN 500 MG IV SOLR
INTRAVENOUS | Status: AC
  Filled 2018-02-12: qty 500

## 2018-02-12 MED ORDER — BENZONATATE 100 MG PO CAPS
100.0000 mg | ORAL_CAPSULE | Freq: Three times a day (TID) | ORAL | Status: DC | PRN
  Administered 2018-02-12: 100 mg via ORAL
  Filled 2018-02-12 (×2): qty 1

## 2018-02-12 MED ORDER — SODIUM CHLORIDE 0.9 % IV SOLN
INTRAVENOUS | Status: AC
  Administered 2018-02-12: 10:00:00 via INTRAVENOUS

## 2018-02-12 MED ORDER — SODIUM CHLORIDE 0.9 % IV SOLN
500.0000 mg | Freq: Once | INTRAVENOUS | Status: AC
  Administered 2018-02-12: 500 mg via INTRAVENOUS
  Filled 2018-02-12: qty 500

## 2018-02-12 MED ORDER — SODIUM CHLORIDE 0.9 % IV SOLN
1.0000 g | INTRAVENOUS | Status: DC
  Administered 2018-02-12 – 2018-02-15 (×4): 1 g via INTRAVENOUS
  Filled 2018-02-12 (×2): qty 1
  Filled 2018-02-12: qty 10
  Filled 2018-02-12: qty 1

## 2018-02-12 MED ORDER — ACETAMINOPHEN 650 MG RE SUPP: 650.0000 mg | Freq: Four times a day (QID) | RECTAL | Status: DC | PRN

## 2018-02-12 MED ORDER — ACETAMINOPHEN 325 MG PO TABS
650.0000 mg | ORAL_TABLET | Freq: Four times a day (QID) | ORAL | Status: DC | PRN
  Administered 2018-02-12: 650 mg via ORAL
  Filled 2018-02-12: qty 2

## 2018-02-12 MED ORDER — AMLODIPINE BESYLATE 5 MG PO TABS
5.0000 mg | ORAL_TABLET | Freq: Every day | ORAL | Status: DC
  Administered 2018-02-12 – 2018-02-17 (×6): 5 mg via ORAL
  Filled 2018-02-12 (×6): qty 1

## 2018-02-12 MED ORDER — IOPAMIDOL (ISOVUE-370) INJECTION 76%
80.0000 mL | Freq: Once | INTRAVENOUS | Status: AC | PRN
  Administered 2018-02-12: 80 mL via INTRAVENOUS

## 2018-02-12 MED ORDER — INSULIN ASPART 100 UNIT/ML ~~LOC~~ SOLN
0.0000 [IU] | Freq: Every day | SUBCUTANEOUS | Status: DC
  Administered 2018-02-14: 2 [IU] via SUBCUTANEOUS

## 2018-02-12 MED ORDER — ASPIRIN EC 81 MG PO TBEC
81.0000 mg | DELAYED_RELEASE_TABLET | Freq: Every day | ORAL | Status: DC
  Administered 2018-02-12 – 2018-02-17 (×6): 81 mg via ORAL
  Filled 2018-02-12 (×6): qty 1

## 2018-02-12 MED ORDER — FUROSEMIDE 10 MG/ML IJ SOLN
40.0000 mg | Freq: Once | INTRAMUSCULAR | Status: AC
  Administered 2018-02-12: 40 mg via INTRAVENOUS
  Filled 2018-02-12: qty 4

## 2018-02-12 MED ORDER — ENOXAPARIN SODIUM 40 MG/0.4ML ~~LOC~~ SOLN
40.0000 mg | SUBCUTANEOUS | Status: DC
  Administered 2018-02-12: 40 mg via SUBCUTANEOUS
  Filled 2018-02-12: qty 0.4

## 2018-02-12 MED ORDER — LISINOPRIL 5 MG PO TABS: 5.0000 mg | ORAL_TABLET | Freq: Every day | ORAL | Status: DC

## 2018-02-12 MED ORDER — INSULIN DETEMIR 100 UNIT/ML ~~LOC~~ SOLN
30.0000 [IU] | Freq: Every day | SUBCUTANEOUS | Status: DC
  Administered 2018-02-12: 30 [IU] via SUBCUTANEOUS
  Filled 2018-02-12 (×2): qty 0.3

## 2018-02-12 MED ORDER — PNEUMOCOCCAL VAC POLYVALENT 25 MCG/0.5ML IJ INJ
0.5000 mL | INJECTION | INTRAMUSCULAR | Status: AC
  Administered 2018-02-13: 0.5 mL via INTRAMUSCULAR
  Filled 2018-02-12: qty 0.5

## 2018-02-12 MED ORDER — ROSUVASTATIN CALCIUM 20 MG PO TABS
20.0000 mg | ORAL_TABLET | Freq: Every day | ORAL | Status: DC
  Administered 2018-02-12 – 2018-02-17 (×6): 20 mg via ORAL
  Filled 2018-02-12 (×6): qty 1

## 2018-02-12 MED ORDER — GUAIFENESIN-DM 100-10 MG/5ML PO SYRP
10.0000 mL | ORAL_SOLUTION | ORAL | Status: DC | PRN
  Administered 2018-02-12 – 2018-02-14 (×4): 10 mL via ORAL
  Filled 2018-02-12 (×5): qty 10

## 2018-02-12 MED ORDER — INSULIN ASPART 100 UNIT/ML ~~LOC~~ SOLN
0.0000 [IU] | Freq: Three times a day (TID) | SUBCUTANEOUS | Status: DC
  Administered 2018-02-12: 2 [IU] via SUBCUTANEOUS
  Administered 2018-02-13 – 2018-02-14 (×3): 3 [IU] via SUBCUTANEOUS
  Administered 2018-02-14: 2 [IU] via SUBCUTANEOUS
  Administered 2018-02-15: 5 [IU] via SUBCUTANEOUS
  Administered 2018-02-15 (×2): 2 [IU] via SUBCUTANEOUS
  Administered 2018-02-16: 1 [IU] via SUBCUTANEOUS
  Administered 2018-02-16: 2 [IU] via SUBCUTANEOUS
  Administered 2018-02-16: 5 [IU] via SUBCUTANEOUS
  Administered 2018-02-17: 2 [IU] via SUBCUTANEOUS
  Administered 2018-02-17: 7 [IU] via SUBCUTANEOUS

## 2018-02-12 MED ORDER — METFORMIN HCL 500 MG PO TABS: 1000.0000 mg | ORAL_TABLET | Freq: Two times a day (BID) | ORAL | Status: DC

## 2018-02-12 NOTE — H&P (Addendum)
TRH H&P   Patient Demographics:    Melissa Howe, is a 67 y.o. female  MRN: [id number]   DOB - [birth year]  Admit Date - 02/11/2018  Outpatient Primary MD for the patient is System, Ross Not In  Referring MD/NP/PA:  Ezequiel Essex  Outpatient Specialists:   Patient coming from: home, med center [facility]  Chief Complaint  Patient presents with  . Cough      HPI:    Melissa Howe  is a 67 y.o. female, w hypertension, hyperlipidemia, dm2 c/o cough x 28months.  Pt states that cough mostly dry worsened yesterday, as well as increase in dyspnea.  Pt denies fever, chills, cp, palp, n/v, diarrhea, brbpr.    In Ed.  Wbc 14.0, Hgb 9.3, Plt 325 Na 133, k 4.2, Bun 27, Creatinine 1.73 Glucose 281 BNP 267.5 D dimer 1.21  CTA chest IMPRESSION: 1. No evidence of pulmonary embolus. 2. Patchy left lower lobe pneumonia. Additional minimal bilateral airspace opacities seen. Underlying peribronchial thickening noted. 3. Mild chronic loss of height at vertebral body T12.  Pt will be admitted for CAP       Review of systems:    In addition to the HPI above  No Fever-chills, No Headache, No changes with Vision or hearing, No problems swallowing food or Liquids, No Chest pain  No Abdominal pain, No Nausea or Vommitting, Bowel movements are regular, No Blood in stool or Urine, No dysuria, No new skin rashes or bruises, No new joints pains-aches,  No new weakness, tingling, numbness in any extremity, No recent weight gain or loss, No polyuria, polydypsia or polyphagia, No significant Mental Stressors.  A full 10 point Review of Systems was done, except as stated above, all other Review of Systems were negative.   With Past History of the following :    Past Medical History:  Diagnosis Date  . Diabetes mellitus    a. noncompliant with insulin.  Marland Kitchen Hyperlipemia   . Hypertension       Past Surgical History:  Procedure Laterality Date  . APPENDECTOMY    . TONSILLECTOMY    . TUBAL LIGATION        Social History:     Social History   Tobacco Use  . Smoking status: Former Smoker    Years: 4.00    Types: Cigars    Last attempt to quit: 01/19/1984    Years since quitting: 34.0  . Smokeless tobacco: Never Used  Substance Use Topics  . Alcohol use: No     Lives - at home  Mobility - walks by self   Family History :     Family History  Problem Relation Age of Onset  . Hypertension Mother   . Diabetes Mother   . Hypertension Sister      Home Medications:   Prior to Admission medications   Medication Sig Start Date End  Date Taking? Authorizing Provider  amLODipine (NORVASC) 5 MG tablet Take 1 tablet (5 mg total) by mouth daily. 09/08/16  Yes TatShanon Brow, MD  aspirin EC 81 MG tablet Take 1 tablet (81 mg total) by mouth daily. 09/08/16  Yes Tat, Shanon Brow, MD  carvedilol (COREG) 6.25 MG tablet Take 1 tablet (6.25 mg total) by mouth 2 (two) times daily with a meal. 09/08/16  Yes Tat, Shanon Brow, MD  Insulin Detemir (LEVEMIR) 100 UNIT/ML Pen Inject 30 Units into the skin daily with breakfast. 09/09/16  Yes Tat, Shanon Brow, MD  Insulin Pen Needle 32G X 4 MM MISC Use with insulin pen to dispense insulin as directed 09/08/16  Yes Tat, Shanon Brow, MD  lisinopril (PRINIVIL,ZESTRIL) 5 MG tablet Take 1 tablet (5 mg total) by mouth daily. 09/08/16  Yes Tat, Shanon Brow, MD  metFORMIN (GLUCOPHAGE) 500 MG tablet Take 2 tablets (1,000 mg total) by mouth 2 (two) times daily with a meal. 07/11/15  Yes Davonna Belling, MD  rosuvastatin (CRESTOR) 20 MG tablet Take 1 tablet (20 mg total) by mouth daily. 09/08/16  Yes Tat, Shanon Brow, MD  triamcinolone (KENALOG) 0.025 % ointment Apply 1 application topically 2 (two) times daily. Do not apply to face, only apply to affected areas 08/03/17  Yes Little, Wenda Overland, MD     Allergies:    No Known Allergies   Physical Exam:   Vitals  Blood  pressure (!) 157/85, pulse 91, temperature 98.4 F (36.9 C), temperature source Oral, resp. rate (!) 24, height 5\' 4"  (1.626 m), weight 78.3 kg (172 lb 11.2 oz), SpO2 99 %.   1. General lying in bed in NAD,   2. Normal affect and insight, Not Suicidal or Homicidal, Awake Alert, Oriented X 3.  3. No F.N deficits, ALL C.Nerves Intact, Strength 5/5 all 4 extremities, Sensation intact all 4 extremities, Plantars down going.  4. Ears and Eyes appear Normal, Conjunctivae clear, PERRLA. Moist Oral Mucosa.  5. Supple Neck, No JVD, No cervical lymphadenopathy appriciated, No Carotid Bruits.  6. Symmetrical Chest wall movement, Good air movement bilaterally, slight creackles left lung base  7. RRR, No Gallops, Rubs or Murmurs, No Parasternal Heave.  8. Positive Bowel Sounds, Abdomen Soft, No tenderness, No organomegaly appriciated,No rebound -guarding or rigidity.  9.  No Cyanosis, Normal Skin Turgor, No Skin Rash or Bruise.  10. Good muscle tone,  joints appear normal , no effusions, Normal ROM.  11. No Palpable Lymph Nodes in Neck or Axillae     Data Review:    CBC Recent Labs  Lab 02/11/18 2307  WBC 14.0*  HGB 9.3*  HCT 27.6*  PLT 325  MCV 84.9  MCH 28.6  MCHC 33.7  RDW 13.5  LYMPHSABS 1.6  MONOABS 1.2*  EOSABS 0.5  BASOSABS 0.0   ------------------------------------------------------------------------------------------------------------------  Chemistries  Recent Labs  Lab 02/11/18 2307  NA 133*  K 4.2  CL 103  CO2 20*  GLUCOSE 281*  BUN 27*  CREATININE 1.73*  CALCIUM 8.4*   ------------------------------------------------------------------------------------------------------------------ estimated creatinine clearance is 31.9 mL/min (A) (by C-G formula based on SCr of 1.73 mg/dL (H)). ------------------------------------------------------------------------------------------------------------------ No results for input(s): TSH, T4TOTAL, T3FREE, THYROIDAB in  the last 72 hours.  Invalid input(s): FREET3  Coagulation profile No results for input(s): INR, PROTIME in the last 168 hours. ------------------------------------------------------------------------------------------------------------------- Recent Labs    02/11/18 2307  DDIMER 1.21*   -------------------------------------------------------------------------------------------------------------------  Cardiac Enzymes No results for input(s): CKMB, TROPONINI, MYOGLOBIN in the last 168 hours.  Invalid input(s): CK ------------------------------------------------------------------------------------------------------------------  Component Value Date/Time   BNP 267.5 (H) 02/11/2018 2307     ---------------------------------------------------------------------------------------------------------------  Urinalysis    Component Value Date/Time   COLORURINE YELLOW 09/06/2016 1735   APPEARANCEUR CLOUDY (A) 09/06/2016 1735   LABSPEC 1.026 09/06/2016 1735   PHURINE 6.5 09/06/2016 1735   GLUCOSEU >1000 (A) 09/06/2016 1735   HGBUR TRACE (A) 09/06/2016 1735   BILIRUBINUR NEGATIVE 09/06/2016 1735   KETONESUR NEGATIVE 09/06/2016 1735   PROTEINUR 100 (A) 09/06/2016 1735   UROBILINOGEN 1.0 07/13/2015 1543   NITRITE NEGATIVE 09/06/2016 1735   LEUKOCYTESUR NEGATIVE 09/06/2016 1735    ----------------------------------------------------------------------------------------------------------------   Imaging Results:    Dg Chest 2 View  Result Date: 02/11/2018 CLINICAL DATA:  Cough x2 months EXAM: CHEST - 2 VIEW COMPARISON:  09/06/2016 and 02/24/2016 FINDINGS: Heart size is normal. No aortic aneurysm. Pulmonary vascular congestion is seen since prior. Pulmonary opacity in the right lower lobe is new and may reflect a small focus of pneumonia versus superimposition of pulmonary vasculature and osseous elements. IMPRESSION: Mild pulmonary vascular congestion since prior. Pulmonary opacity  at the right lung base may represent superimposition of pulmonary vasculature and ribs. A small focus of pneumonia is not entirely excluded given history of cough. This appears new since prior comparisons dating back through 02/24/2016. Electronically Signed   By: Ashley Royalty M.D.   On: 02/11/2018 22:35   Ct Angio Chest Pe W And/or Wo Contrast  Result Date: 02/12/2018 CLINICAL DATA:  Subacute onset of cough and shortness of breath. Elevated D-dimer. EXAM: CT ANGIOGRAPHY CHEST WITH CONTRAST TECHNIQUE: Multidetector CT imaging of the chest was performed using the standard protocol during bolus administration of intravenous contrast. Multiplanar CT image reconstructions and MIPs were obtained to evaluate the vascular anatomy. CONTRAST:  76mL ISOVUE-370 IOPAMIDOL (ISOVUE-370) INJECTION 76% COMPARISON:  Chest radiograph performed 02/11/2018 FINDINGS: Cardiovascular:  There is no evidence of pulmonary embolus. The heart is normal in size. The thoracic aorta is grossly unremarkable. The great vessels are within normal limits. Mediastinum/Nodes: The mediastinum is unremarkable in appearance. No mediastinal lymphadenopathy is seen. No pericardial effusion is identified. The thyroid gland is unremarkable. No axillary lymphadenopathy is seen. Lungs/Pleura: Patchy left lower lobe airspace opacification is compatible with pneumonia. Additional minimal airspace opacities are noted bilaterally. Underlying peribronchial thickening is noted. No pleural effusion or pneumothorax is seen. Upper Abdomen: The visualized portions of the liver and spleen are unremarkable. The visualized portions of the gallbladder, pancreas, adrenal glands and right kidney are within normal limits. There is minimal nonspecific left-sided perinephric stranding. Musculoskeletal: No acute osseous abnormalities are identified. There is mild chronic loss of height at vertebral body T12. The visualized musculature is unremarkable in appearance. Review of the  MIP images confirms the above findings. IMPRESSION: 1. No evidence of pulmonary embolus. 2. Patchy left lower lobe pneumonia. Additional minimal bilateral airspace opacities seen. Underlying peribronchial thickening noted. 3. Mild chronic loss of height at vertebral body T12. Electronically Signed   By: Garald Balding M.D.   On: 02/12/2018 03:23       Assessment & Plan:    Active Problems:   CAP (community acquired pneumonia)    CAP Blood culture x2 Urine strep antigen Urine legionella antigen Influenza, resp viral panel Rocephin 1gm iv qday, Zithromax 500mg  iv qday  Hypertension Cont amlodipine 5mg  po qday Cont carvedilol 6.25mg  po bid  Mild ARF HOLD LIsinopril HOLD Metformin  Hyperlpidemia Cont Crestor 20mg  po qday  Dm2 fsbs ac and qhs, ISS  DVT Prophylaxis  Lovenox -  SCDs  AM Labs Ordered, also please review Full Orders  Family Communication: Admission, patients condition and plan of care including tests being ordered have been discussed with the patient who indicate understanding and agree with the plan and Code Status.  Code Status FULL CODE  Likely DC to  home  Condition GUARDED   Consults called:  none  Admission status: inpatient  Time spent in minutes : 45   Jani Gravel M.D on 02/12/2018 at 7:11 AM  Between 7am to 7pm - Pager - [phone]  After 7pm go to [email or web address] - password [facility]  [facility] - Office  [phone]

## 2018-02-12 NOTE — ED Notes (Signed)
REport Called to [place] on 4th floor at [facility]

## 2018-02-12 NOTE — Progress Notes (Signed)
Subjective: Patient admitted this morning, see detailed H&P by Dr Maudie Mercury  67 y.o. female, w hypertension, hyperlipidemia, dm2 c/o cough x 50months.  Pt states that cough mostly dry worsened yesterday, as well as increase in dyspnea.  Pt denies fever, chills, cp, palp, n/v, diarrhea, brbpr.    Patient denies shortness of breath, cough.  Vitals:   02/12/18 0603 02/12/18 1316  BP: (!) 157/85 134/77  Pulse: 91 77  Resp: (!) 24 16  Temp: 98.4 F (36.9 C) 98.3 F (36.8 C)  SpO2: 99% 97%      A/P Community acquired pneumonia Hypertension Mild AKI Diabetes mellitus  Continue ceftriaxone and Zithromax Follow blood culture results Patient has mild AKI, continue to hold lisinopril, metformin Follow CBC and BMP in a.m.    [facility] [phone]

## 2018-02-12 NOTE — Progress Notes (Signed)
Pharmacy IV azithromycin>>PO Eudelia Bunch, Pharm.D. [phone] 02/12/2018 1:46 PM

## 2018-02-12 NOTE — Progress Notes (Signed)
67 yo female with cough x2 months has pneumonia in the Left lower lung per CTA chest ED thinks clniically might have slight CHF ?

## 2018-02-13 DIAGNOSIS — R7881 Bacteremia: Secondary | ICD-10-CM

## 2018-02-13 DIAGNOSIS — N179 Acute kidney failure, unspecified: Secondary | ICD-10-CM

## 2018-02-13 LAB — COMPREHENSIVE METABOLIC PANEL
ALT: 12 U/L — ABNORMAL LOW (ref 14–54)
AST: 19 U/L (ref 15–41)
Albumin: 2.6 g/dL — ABNORMAL LOW (ref 3.5–5.0)
Alkaline Phosphatase: 58 U/L (ref 38–126)
Anion gap: 9 (ref 5–15)
BUN: 37 mg/dL — ABNORMAL HIGH (ref 6–20)
CO2: 22 mmol/L (ref 22–32)
Calcium: 8.3 mg/dL — ABNORMAL LOW (ref 8.9–10.3)
Chloride: 109 mmol/L (ref 101–111)
Creatinine, Ser: 2.57 mg/dL — ABNORMAL HIGH (ref 0.44–1.00)
GFR calc Af Amer: 21 mL/min — ABNORMAL LOW (ref >60)
GFR calc non Af Amer: 18 mL/min — ABNORMAL LOW (ref >60)
Glucose, Bld: 50 mg/dL — ABNORMAL LOW (ref 65–99)
Potassium: 3.6 mmol/L (ref 3.5–5.1)
Sodium: 140 mmol/L (ref 135–145)
Total Bilirubin: 0.4 mg/dL (ref 0.3–1.2)
Total Protein: 6.4 g/dL — ABNORMAL LOW (ref 6.5–8.1)

## 2018-02-13 LAB — BLOOD CULTURE ID PANEL (REFLEXED)

## 2018-02-13 LAB — CBC
HCT: 26.4 % — ABNORMAL LOW (ref 36.0–46.0)
Hemoglobin: 8.6 g/dL — ABNORMAL LOW (ref 12.0–15.0)
MCH: 27.7 pg (ref 26.0–34.0)
MCHC: 32.6 g/dL (ref 30.0–36.0)
MCV: 84.9 fL (ref 78.0–100.0)
Platelets: 304 10*3/uL (ref 150–400)
RBC: 3.11 MIL/uL — ABNORMAL LOW (ref 3.87–5.11)
RDW: 14 % (ref 11.5–15.5)
WBC: 9.2 10*3/uL (ref 4.0–10.5)

## 2018-02-13 LAB — GLUCOSE, CAPILLARY
Glucose-Capillary: 54 mg/dL — ABNORMAL LOW (ref 65–99)
Glucose-Capillary: 222 mg/dL — ABNORMAL HIGH (ref 65–99)
Glucose-Capillary: 203 mg/dL — ABNORMAL HIGH (ref 65–99)
Glucose-Capillary: 141 mg/dL — ABNORMAL HIGH (ref 65–99)

## 2018-02-13 MED ORDER — BISACODYL 10 MG RE SUPP: 10.0000 mg | Freq: Every day | RECTAL | Status: DC | PRN

## 2018-02-13 MED ORDER — INSULIN DETEMIR 100 UNIT/ML ~~LOC~~ SOLN
15.0000 [IU] | Freq: Once | SUBCUTANEOUS | Status: AC
  Administered 2018-02-13: 15 [IU] via SUBCUTANEOUS
  Filled 2018-02-13: qty 0.15

## 2018-02-13 MED ORDER — SODIUM CHLORIDE 0.9 % IV SOLN
INTRAVENOUS | Status: DC
  Administered 2018-02-13 – 2018-02-14 (×2): via INTRAVENOUS

## 2018-02-13 MED ORDER — ENOXAPARIN SODIUM 30 MG/0.3ML ~~LOC~~ SOLN
30.0000 mg | SUBCUTANEOUS | Status: DC
  Administered 2018-02-13 – 2018-02-16 (×4): 30 mg via SUBCUTANEOUS
  Filled 2018-02-13 (×4): qty 0.3

## 2018-02-13 NOTE — Progress Notes (Signed)
PHARMACY - PHYSICIAN COMMUNICATION CRITICAL VALUE ALERT - BLOOD CULTURE IDENTIFICATION (BCID)  Melissa Howe is an 67 y.o. female who presented to [facility] on 02/11/2018 with a chief complaint of SOB and cough  Assessment:  Possible contaminant per lab  Name of physician (or Provider) Contacted: Lama  Current antibiotics: Rocephin and Zithromax for CAP  Changes to prescribed antibiotics recommended: none Recommendations accepted by provider  Results for orders placed or performed during the hospital encounter of 02/11/18  Blood Culture ID Panel (Reflexed) (Collected: 02/12/2018  5:18 AM)  Result Value Ref Range   Enterococcus species NOT DETECTED NOT DETECTED   Listeria monocytogenes NOT DETECTED NOT DETECTED   Staphylococcus species NOT DETECTED NOT DETECTED   Staphylococcus aureus NOT DETECTED NOT DETECTED   Streptococcus species NOT DETECTED NOT DETECTED   Streptococcus agalactiae NOT DETECTED NOT DETECTED   Streptococcus pneumoniae NOT DETECTED NOT DETECTED   Streptococcus pyogenes NOT DETECTED NOT DETECTED   Acinetobacter baumannii NOT DETECTED NOT DETECTED   Enterobacteriaceae species NOT DETECTED NOT DETECTED   Enterobacter cloacae complex NOT DETECTED NOT DETECTED   Escherichia coli NOT DETECTED NOT DETECTED   Klebsiella oxytoca NOT DETECTED NOT DETECTED   Klebsiella pneumoniae NOT DETECTED NOT DETECTED   Proteus species NOT DETECTED NOT DETECTED   Serratia marcescens NOT DETECTED NOT DETECTED   Haemophilus influenzae NOT DETECTED NOT DETECTED   Neisseria meningitidis NOT DETECTED NOT DETECTED   Pseudomonas aeruginosa NOT DETECTED NOT DETECTED   Candida albicans NOT DETECTED NOT DETECTED   Candida glabrata NOT DETECTED NOT DETECTED   Candida krusei NOT DETECTED NOT DETECTED   Candida parapsilosis NOT DETECTED NOT DETECTED   Candida tropicalis NOT DETECTED NOT DETECTED    Eudelia Bunch, Pharm.D.  02/13/2018 12:14 PM

## 2018-02-13 NOTE — Progress Notes (Signed)
[facility]  PROGRESS NOTE  Kasyn Stouffer KKX:[id number] DOB: [birth year] DOA: 02/11/2018 PCP: System, Pcp Not In   Brief HPI:    67 y.o.female,w hypertension, hyperlipidemia, dm2 c/o cough x 35months. Pt states that cough mostly dry worsened yesterday, as well as increase in dyspnea. Pt denies fever, chills, cp, palp, n/v, diarrhea, brbpr.    Subjective   Patient seen and examined, breathing improved. Denies chest pain, cough. Blood culture , 1 out of 2 bottles is growing gram-positive rods.  Patient is currently on ceftriaxone and Zithromax.   Assessment/Plan:     1. Community-acquired pneumonia-improved, continue Rocephin and Zithromax.  Influenza is negative, urinary strep pneumo antigen is negative, respiratory panel is negative.  Urine for Legionella is pending. 2. Gram-positive rod bacteremia-1 out of 2 bottles blood cultures positive for gram-positive rods, likely contamination.  Will await for final culture results.  We will not treat for bacteremia  at this time.  Called and discussed with infectious disease Dr. Graylon Good 3. Acute kidney injury-the patient has creatinine of 2.57, worse from 1.73 yesterday.  Continue IV normal saline at 100 ml/h.  Follow BMP in am.  Hold metformin, lisinopril. 4. Hypertension-blood pressure stable, continue amlodipine, carvedilol 5. Diabetes mellitus-continue sliding scale insulin with NovoLog.  Blood glucose well controlled    DVT prophylaxis: Lovenox  Code Status: Full code  Family Communication: No family at bedside  Disposition Plan: likely home when medically ready for discharge   Consultants:  None   Procedures:  None   Antibiotics:   Anti-infectives (From admission, onward)   Start     Dose/Rate Route Frequency Ordered Stop   02/13/18 1000  azithromycin (ZITHROMAX) tablet 500 mg     500 mg Oral Daily 02/12/18 1346     02/12/18 2200  azithromycin (ZITHROMAX) 500 mg in sodium chloride 0.9 % 250 mL IVPB  Status:   Discontinued     500 mg 250 mL/hr over 60 Minutes Intravenous Every 24 hours 02/12/18 1025 02/12/18 1346   02/12/18 2200  cefTRIAXone (ROCEPHIN) 1 g in sodium chloride 0.9 % 100 mL IVPB     1 g 200 mL/hr over 30 Minutes Intravenous Every 24 hours 02/12/18 1025     02/12/18 0452  azithromycin (ZITHROMAX) 500 MG injection    Note to Pharmacy:  Collene Gobble   : cabinet override      02/12/18 0452 02/12/18 1659   02/12/18 0330  cefTRIAXone (ROCEPHIN) 1 g in sodium chloride 0.9 % 100 mL IVPB     1 g 200 mL/hr over 30 Minutes Intravenous  Once 02/12/18 0327 02/12/18 0545   02/12/18 0330  azithromycin (ZITHROMAX) 500 mg in sodium chloride 0.9 % 250 mL IVPB     500 mg 250 mL/hr over 60 Minutes Intravenous  Once 02/12/18 0327 02/12/18 0616       Objective   Vitals:   02/12/18 1316 02/12/18 2139 02/13/18 0633 02/13/18 1255  BP: 134/77 127/67 (!) 161/84 (!) 162/88  Pulse: 77 77 73 79  Resp: 16 20 20 14   Temp: 98.3 F (36.8 C) 98.8 F (37.1 C) 98.3 F (36.8 C) 98 F (36.7 C)  TempSrc: Oral Oral Oral Oral  SpO2: 97% 96% 97% 98%  Weight:      Height:        Intake/Output Summary (Last 24 hours) at 02/13/2018 1435 Last data filed at 02/13/2018 0913 Gross per 24 hour  Intake 770 ml  Output 300 ml  Net 470 ml   Filed  Weights   02/11/18 2132 02/12/18 0603  Weight: 81.2 kg (179 lb) 78.3 kg (172 lb 11.2 oz)     Physical Examination:   Physical Exam:  Mouth: Oral mucosa is moist, no lesions on palate,  Neck: Supple, no deformities, masses, or tenderness Lungs: Normal respiratory effort, bilateral clear to auscultation, no crackles or wheezes.  Heart: Regular rate and rhythm, S1 and S2 normal, no murmurs, rubs auscultated Abdomen: BS normoactive,soft,nondistended,non-tender to palpation,no organomegaly Extremities: No pretibial edema, no erythema, no cyanosis, no clubbing Neuro : Alert and oriented to time, place and person, No focal deficits     Data Reviewed: I have  personally reviewed following labs and imaging studies  CBG: Recent Labs  Lab 02/12/18 1128 02/12/18 1746 02/12/18 2137 02/13/18 0801 02/13/18 1209  GLUCAP 198* 77 63* 54* 222*    CBC: Recent Labs  Lab 02/11/18 2307 02/13/18 0536  WBC 14.0* 9.2  NEUTROABS 10.7*  --   HGB 9.3* 8.6*  HCT 27.6* 26.4*  MCV 84.9 84.9  PLT 325 496    Basic Metabolic Panel: Recent Labs  Lab 02/11/18 2307 02/13/18 0536  NA 133* 140  K 4.2 3.6  CL 103 109  CO2 20* 22  GLUCOSE 281* 50*  BUN 27* 37*  CREATININE 1.73* 2.57*  CALCIUM 8.4* 8.3*    Recent Results (from the past 240 hour(s))  Blood culture (routine x 2)     Status: None (Preliminary result)   Collection Time: 02/12/18  5:18 AM  Result Value Ref Range Status   Specimen Description   Final    BLOOD RIGHT WRIST Performed at [facility], [address] [place], [place] [address]    Special Requests   Final    BOTTLES DRAWN AEROBIC ONLY Blood Culture adequate volume Performed at [facility], [address] [place], [place] [address]    Culture  Setup Time   Final    GRAM POSITIVE RODS AEROBIC BOTTLE ONLY CRITICAL RESULT CALLED TO, READ BACK BY AND VERIFIED WITH: N GLOGOVAC,PHARMD AT 5993 02/13/18 BY L BENFIELD Performed at [facility], [place] [address], [place], [place] [address]    Culture GRAM POSITIVE RODS  Final   Report Status PENDING  Incomplete  Blood Culture ID Panel (Reflexed)     Status: None   Collection Time: 02/12/18  5:18 AM  Result Value Ref Range Status   Enterococcus species NOT DETECTED NOT DETECTED Final   Listeria monocytogenes NOT DETECTED NOT DETECTED Final   Staphylococcus species NOT DETECTED NOT DETECTED Final   Staphylococcus aureus NOT DETECTED NOT DETECTED Final   Streptococcus species NOT DETECTED NOT DETECTED Final   Streptococcus agalactiae NOT DETECTED NOT DETECTED Final   Streptococcus pneumoniae NOT DETECTED NOT DETECTED Final   Streptococcus pyogenes  NOT DETECTED NOT DETECTED Final   Acinetobacter baumannii NOT DETECTED NOT DETECTED Final   Enterobacteriaceae species NOT DETECTED NOT DETECTED Final   Enterobacter cloacae complex NOT DETECTED NOT DETECTED Final   Escherichia coli NOT DETECTED NOT DETECTED Final   Klebsiella oxytoca NOT DETECTED NOT DETECTED Final   Klebsiella pneumoniae NOT DETECTED NOT DETECTED Final   Proteus species NOT DETECTED NOT DETECTED Final   Serratia marcescens NOT DETECTED NOT DETECTED Final   Haemophilus influenzae NOT DETECTED NOT DETECTED Final   Neisseria meningitidis NOT DETECTED NOT DETECTED Final   Pseudomonas aeruginosa NOT DETECTED NOT DETECTED Final   Candida albicans NOT DETECTED NOT DETECTED Final   Candida  glabrata NOT DETECTED NOT DETECTED Final   Candida krusei NOT DETECTED NOT DETECTED Final   Candida parapsilosis NOT DETECTED NOT DETECTED Final   Candida tropicalis NOT DETECTED NOT DETECTED Final    Comment: Performed at [facility], [place] [address] [place], [place] [address]  Respiratory Panel by PCR     Status: None   Collection Time: 02/12/18  9:36 AM  Result Value Ref Range Status   Adenovirus NOT DETECTED NOT DETECTED Final   Coronavirus 229E NOT DETECTED NOT DETECTED Final   Coronavirus HKU1 NOT DETECTED NOT DETECTED Final   Coronavirus NL63 NOT DETECTED NOT DETECTED Final   Coronavirus OC43 NOT DETECTED NOT DETECTED Final   Metapneumovirus NOT DETECTED NOT DETECTED Final   Rhinovirus / Enterovirus NOT DETECTED NOT DETECTED Final   Influenza A NOT DETECTED NOT DETECTED Final   Influenza B NOT DETECTED NOT DETECTED Final   Parainfluenza Virus 1 NOT DETECTED NOT DETECTED Final   Parainfluenza Virus 2 NOT DETECTED NOT DETECTED Final   Parainfluenza Virus 3 NOT DETECTED NOT DETECTED Final   Parainfluenza Virus 4 NOT DETECTED NOT DETECTED Final   Respiratory Syncytial Virus NOT DETECTED NOT DETECTED Final   Bordetella pertussis NOT DETECTED NOT DETECTED Final   Chlamydophila  pneumoniae NOT DETECTED NOT DETECTED Final   Mycoplasma pneumoniae NOT DETECTED NOT DETECTED Final    Comment: Performed at [facility], [place] [address], [place], [place] [address]     Liver Function Tests: Recent Labs  Lab 02/13/18 0536  AST 19  ALT 12*  ALKPHOS 58  BILITOT 0.4  PROT 6.4*  ALBUMIN 2.6*   No results for input(s): LIPASE, AMYLASE in the last 168 hours. No results for input(s): AMMONIA in the last 168 hours.  Cardiac Enzymes: No results for input(s): CKTOTAL, CKMB, CKMBINDEX, TROPONINI in the last 168 hours. BNP (last 3 results) Recent Labs    02/11/18 2307  BNP 267.5*    ProBNP (last 3 results) No results for input(s): PROBNP in the last 8760 hours.    Studies: Dg Chest 2 View  Result Date: 02/11/2018 CLINICAL DATA:  Cough x2 months EXAM: CHEST - 2 VIEW COMPARISON:  09/06/2016 and 02/24/2016 FINDINGS: Heart size is normal. No aortic aneurysm. Pulmonary vascular congestion is seen since prior. Pulmonary opacity in the right lower lobe is new and may reflect a small focus of pneumonia versus superimposition of pulmonary vasculature and osseous elements. IMPRESSION: Mild pulmonary vascular congestion since prior. Pulmonary opacity at the right lung base may represent superimposition of pulmonary vasculature and ribs. A small focus of pneumonia is not entirely excluded given history of cough. This appears new since prior comparisons dating back through 02/24/2016. Electronically Signed   By: Ashley Royalty M.D.   On: 02/11/2018 22:35   Ct Angio Chest Pe W And/or Wo Contrast  Result Date: 02/12/2018 CLINICAL DATA:  Subacute onset of cough and shortness of breath. Elevated D-dimer. EXAM: CT ANGIOGRAPHY CHEST WITH CONTRAST TECHNIQUE: Multidetector CT imaging of the chest was performed using the standard protocol during bolus administration of intravenous contrast. Multiplanar CT image reconstructions and MIPs were obtained to evaluate the vascular anatomy.  CONTRAST:  63mL ISOVUE-370 IOPAMIDOL (ISOVUE-370) INJECTION 76% COMPARISON:  Chest radiograph performed 02/11/2018 FINDINGS: Cardiovascular:  There is no evidence of pulmonary embolus. The heart is normal in size. The thoracic aorta is grossly unremarkable. The great vessels are within normal limits. Mediastinum/Nodes: The mediastinum is unremarkable in appearance. No mediastinal lymphadenopathy is seen. No  pericardial effusion is identified. The thyroid gland is unremarkable. No axillary lymphadenopathy is seen. Lungs/Pleura: Patchy left lower lobe airspace opacification is compatible with pneumonia. Additional minimal airspace opacities are noted bilaterally. Underlying peribronchial thickening is noted. No pleural effusion or pneumothorax is seen. Upper Abdomen: The visualized portions of the liver and spleen are unremarkable. The visualized portions of the gallbladder, pancreas, adrenal glands and right kidney are within normal limits. There is minimal nonspecific left-sided perinephric stranding. Musculoskeletal: No acute osseous abnormalities are identified. There is mild chronic loss of height at vertebral body T12. The visualized musculature is unremarkable in appearance. Review of the MIP images confirms the above findings. IMPRESSION: 1. No evidence of pulmonary embolus. 2. Patchy left lower lobe pneumonia. Additional minimal bilateral airspace opacities seen. Underlying peribronchial thickening noted. 3. Mild chronic loss of height at vertebral body T12. Electronically Signed   By: Garald Balding M.D.   On: 02/12/2018 03:23    Scheduled Meds: . amLODipine  5 mg Oral Daily  . aspirin EC  81 mg Oral Daily  . azithromycin  500 mg Oral Daily  . carvedilol  6.25 mg Oral BID WC  . enoxaparin (LOVENOX) injection  30 mg Subcutaneous Q24H  . insulin aspart  0-5 Units Subcutaneous QHS  . insulin aspart  0-9 Units Subcutaneous TID WC  . insulin detemir  30 Units Subcutaneous Q breakfast  . rosuvastatin   20 mg Oral Daily      Time spent: 20 min  [facility] Pager [phone]. If 7PM-7AM, please contact night-coverage at [email or web address], Office  [phone]  password [email or web address]  02/13/2018, 2:35 PM  LOS: 1 day

## 2018-02-14 ENCOUNTER — Inpatient Hospital Stay (HOSPITAL_COMMUNITY): Payer: Medicare Other

## 2018-02-14 DIAGNOSIS — R0989 Other specified symptoms and signs involving the circulatory and respiratory systems: Secondary | ICD-10-CM

## 2018-02-14 DIAGNOSIS — R0602 Shortness of breath: Secondary | ICD-10-CM

## 2018-02-14 LAB — URINALYSIS, ROUTINE W REFLEX MICROSCOPIC
Bilirubin Urine: NEGATIVE
Glucose, UA: 50 mg/dL — AB
Ketones, ur: NEGATIVE mg/dL
Leukocytes, UA: NEGATIVE
Nitrite: NEGATIVE
Protein, ur: 100 mg/dL — AB
Specific Gravity, Urine: 1.009 (ref 1.005–1.030)
pH: 6 (ref 5.0–8.0)

## 2018-02-14 LAB — GLUCOSE, CAPILLARY
Glucose-Capillary: 66 mg/dL (ref 65–99)
Glucose-Capillary: 164 mg/dL — ABNORMAL HIGH (ref 65–99)
Glucose-Capillary: 249 mg/dL — ABNORMAL HIGH (ref 65–99)
Glucose-Capillary: 228 mg/dL — ABNORMAL HIGH (ref 65–99)

## 2018-02-14 LAB — BASIC METABOLIC PANEL
Anion gap: 9 (ref 5–15)
BUN: 40 mg/dL — ABNORMAL HIGH (ref 6–20)
CO2: 19 mmol/L — ABNORMAL LOW (ref 22–32)
Calcium: 8.1 mg/dL — ABNORMAL LOW (ref 8.9–10.3)
Chloride: 112 mmol/L — ABNORMAL HIGH (ref 101–111)
Creatinine, Ser: 2.97 mg/dL — ABNORMAL HIGH (ref 0.44–1.00)
GFR calc Af Amer: 18 mL/min — ABNORMAL LOW (ref >60)
GFR calc non Af Amer: 15 mL/min — ABNORMAL LOW (ref >60)
Glucose, Bld: 58 mg/dL — ABNORMAL LOW (ref 65–99)
Potassium: 3.9 mmol/L (ref 3.5–5.1)
Sodium: 140 mmol/L (ref 135–145)

## 2018-02-14 LAB — LEGIONELLA PNEUMOPHILA SEROGP 1 UR AG: L. pneumophila Serogp 1 Ur Ag: NEGATIVE

## 2018-02-14 LAB — SODIUM, URINE, RANDOM: Sodium, Ur: 92 mmol/L

## 2018-02-14 LAB — CREATININE, URINE, RANDOM: Creatinine, Urine: 41.44 mg/dL

## 2018-02-14 MED ORDER — FUROSEMIDE 10 MG/ML IJ SOLN
80.0000 mg | Freq: Once | INTRAMUSCULAR | Status: AC
  Administered 2018-02-14: 80 mg via INTRAVENOUS
  Filled 2018-02-14: qty 8

## 2018-02-14 NOTE — Progress Notes (Signed)
[facility]  PROGRESS NOTE  Melissa Howe OAC:[id number] DOB: Nov 15, 1950 DOA: 02/11/2018 PCP: System, Pcp Not In   Brief HPI:    67 y.o.female,w hypertension, hyperlipidemia, dm2 c/o cough x 68months. Pt states that cough mostly dry worsened yesterday, as well as increase in dyspnea. Pt denies fever, chills, cp, palp, n/v, diarrhea, brbpr.    Subjective   Patient complains of shortness of breath   Assessment/Plan:     1. Community-acquired pneumonia-improved, continue Rocephin and Zithromax.  Influenza is negative, urinary strep pneumo antigen is negative, respiratory panel is negative.  Urine for Legionella is pending. 2. Gram-positive rod bacteremia-1 out of 2 bottles blood cultures positive for gram-positive rods, likely contamination.  Will await for final culture results.  We will not treat for bacteremia  at this time.  Called and discussed with infectious disease Dr. Graylon Good 3. Acute kidney injury-the patient has creatinine of 2.97, worse from 2.57 yesterday.  Patient has developed contrast-induced nephropathy, will consult nephrology.  Now she is likely developing fluid overload, will discontinue IV fluids 4. Anemia-patient baseline hemoglobin is around 9.3.  Today hemoglobin dropped to 8.6, likely dilutional.  Follow CBC in a.m. 5. Hypertension-blood pressure stable, continue amlodipine, carvedilol 6. Diabetes mellitus-continue sliding scale insulin with NovoLog.  Blood glucose well controlled    DVT prophylaxis: Lovenox  Code Status: Full code  Family Communication: No family at bedside  Disposition Plan: likely home when medically ready for discharge   Consultants:  None   Procedures:  None   Antibiotics:   Anti-infectives (From admission, onward)   Start     Dose/Rate Route Frequency Ordered Stop   02/13/18 1000  azithromycin (ZITHROMAX) tablet 500 mg     500 mg Oral Daily 02/12/18 1346     02/12/18 2200  azithromycin (ZITHROMAX) 500 mg in  sodium chloride 0.9 % 250 mL IVPB  Status:  Discontinued     500 mg 250 mL/hr over 60 Minutes Intravenous Every 24 hours 02/12/18 1025 02/12/18 1346   02/12/18 2200  cefTRIAXone (ROCEPHIN) 1 g in sodium chloride 0.9 % 100 mL IVPB     1 g 200 mL/hr over 30 Minutes Intravenous Every 24 hours 02/12/18 1025     02/12/18 0452  azithromycin (ZITHROMAX) 500 MG injection    Note to Pharmacy:  Collene Gobble   : cabinet override      02/12/18 0452 02/12/18 1659   02/12/18 0330  cefTRIAXone (ROCEPHIN) 1 g in sodium chloride 0.9 % 100 mL IVPB     1 g 200 mL/hr over 30 Minutes Intravenous  Once 02/12/18 0327 02/12/18 0545   02/12/18 0330  azithromycin (ZITHROMAX) 500 mg in sodium chloride 0.9 % 250 mL IVPB     500 mg 250 mL/hr over 60 Minutes Intravenous  Once 02/12/18 0327 02/12/18 0616       Objective   Vitals:   02/13/18 2119 02/14/18 0454 02/14/18 0459 02/14/18 0823  BP: (!) 159/82 (!) 187/97  (!) 157/84  Pulse: 79 81  76  Resp: 18 20    Temp: 98.3 F (36.8 C) 98.5 F (36.9 C)    TempSrc:  Oral    SpO2: 98% 96%  100%  Weight:   81.4 kg (179 lb 6.4 oz)   Height:        Intake/Output Summary (Last 24 hours) at 02/14/2018 1538 Last data filed at 02/14/2018 1457 Gross per 24 hour  Intake 827.83 ml  Output -  Net 827.83 ml   [employer]  02/11/18 2132 02/12/18 0603 02/14/18 0459  Weight: 81.2 kg (179 lb) 78.3 kg (172 lb 11.2 oz) 81.4 kg (179 lb 6.4 oz)     Physical Examination:  Physical Exam:  Mouth: Oral mucosa is moist, no lesions on palate,  Neck: Supple, no deformities, masses, or tenderness Lungs: Normal respiratory effort, decreased breath sounds bilaterally Heart: Regular rate and rhythm, S1 and S2 normal, no murmurs, rubs auscultated Abdomen: BS normoactive,soft,nondistended,non-tender to palpation,no organomegaly Extremities: No pretibial edema, no erythema, no cyanosis, no clubbing Neuro : Alert and oriented to time, place and person, No focal  deficits       Data Reviewed: I have personally reviewed following labs and imaging studies  CBG: Recent Labs  Lab 02/13/18 1209 02/13/18 1634 02/13/18 2117 02/14/18 0804 02/14/18 1218  GLUCAP 222* 203* 141* 66 164*    CBC: Recent Labs  Lab 02/11/18 2307 02/13/18 0536  WBC 14.0* 9.2  NEUTROABS 10.7*  --   HGB 9.3* 8.6*  HCT 27.6* 26.4*  MCV 84.9 84.9  PLT 325 621    Basic Metabolic Panel: Recent Labs  Lab 02/11/18 2307 02/13/18 0536 02/14/18 0535  NA 133* 140 140  K 4.2 3.6 3.9  CL 103 109 112*  CO2 20* 22 19*  GLUCOSE 281* 50* 58*  BUN 27* 37* 40*  CREATININE 1.73* 2.57* 2.97*  CALCIUM 8.4* 8.3* 8.1*    Recent Results (from the past 240 hour(s))  Blood culture (routine x 2)     Status: None (Preliminary result)   Collection Time: 02/12/18  5:18 AM  Result Value Ref Range Status   Specimen Description   Final    BLOOD RIGHT WRIST Performed at [facility], [address] [place], [place] [address]    Special Requests   Final    BOTTLES DRAWN AEROBIC ONLY Blood Culture adequate volume Performed at [facility], [address] [place], [place] [address]    Culture  Setup Time   Final    GRAM POSITIVE RODS AEROBIC BOTTLE ONLY CRITICAL RESULT CALLED TO, READ BACK BY AND VERIFIED WITH: N GLOGOVAC,PHARMD AT 1154 02/13/18 BY L BENFIELD    Culture   Final    GRAM POSITIVE RODS CULTURE REINCUBATED FOR BETTER GROWTH Performed at [facility], [address], [place], [place] [address]    Report Status PENDING  Incomplete  Blood Culture ID Panel (Reflexed)     Status: None   Collection Time: 02/12/18  5:18 AM  Result Value Ref Range Status   Enterococcus species NOT DETECTED NOT DETECTED Final   Listeria monocytogenes NOT DETECTED NOT DETECTED Final   Staphylococcus species NOT DETECTED NOT DETECTED Final   Staphylococcus aureus NOT DETECTED NOT DETECTED Final   Streptococcus species NOT DETECTED NOT DETECTED Final    Streptococcus agalactiae NOT DETECTED NOT DETECTED Final   Streptococcus pneumoniae NOT DETECTED NOT DETECTED Final   Streptococcus pyogenes NOT DETECTED NOT DETECTED Final   Acinetobacter baumannii NOT DETECTED NOT DETECTED Final   Enterobacteriaceae species NOT DETECTED NOT DETECTED Final   Enterobacter cloacae complex NOT DETECTED NOT DETECTED Final   Escherichia coli NOT DETECTED NOT DETECTED Final   Klebsiella oxytoca NOT DETECTED NOT DETECTED Final   Klebsiella pneumoniae NOT DETECTED NOT DETECTED Final   Proteus species NOT DETECTED NOT DETECTED Final   Serratia marcescens NOT DETECTED NOT DETECTED Final   Haemophilus influenzae NOT DETECTED NOT DETECTED Final   Neisseria meningitidis NOT DETECTED NOT DETECTED Final  Pseudomonas aeruginosa NOT DETECTED NOT DETECTED Final   Candida albicans NOT DETECTED NOT DETECTED Final   Candida glabrata NOT DETECTED NOT DETECTED Final   Candida krusei NOT DETECTED NOT DETECTED Final   Candida parapsilosis NOT DETECTED NOT DETECTED Final   Candida tropicalis NOT DETECTED NOT DETECTED Final    Comment: Performed at [facility], [place] [address], [place], [place] [address]  Respiratory Panel by PCR     Status: None   Collection Time: 02/12/18  9:36 AM  Result Value Ref Range Status   Adenovirus NOT DETECTED NOT DETECTED Final   Coronavirus 229E NOT DETECTED NOT DETECTED Final   Coronavirus HKU1 NOT DETECTED NOT DETECTED Final   Coronavirus NL63 NOT DETECTED NOT DETECTED Final   Coronavirus OC43 NOT DETECTED NOT DETECTED Final   Metapneumovirus NOT DETECTED NOT DETECTED Final   Rhinovirus / Enterovirus NOT DETECTED NOT DETECTED Final   Influenza A NOT DETECTED NOT DETECTED Final   Influenza B NOT DETECTED NOT DETECTED Final   Parainfluenza Virus 1 NOT DETECTED NOT DETECTED Final   Parainfluenza Virus 2 NOT DETECTED NOT DETECTED Final   Parainfluenza Virus 3 NOT DETECTED NOT DETECTED Final   Parainfluenza Virus 4 NOT DETECTED NOT  DETECTED Final   Respiratory Syncytial Virus NOT DETECTED NOT DETECTED Final   Bordetella pertussis NOT DETECTED NOT DETECTED Final   Chlamydophila pneumoniae NOT DETECTED NOT DETECTED Final   Mycoplasma pneumoniae NOT DETECTED NOT DETECTED Final    Comment: Performed at [facility], [place] [address], [place], [place] [address]     Liver Function Tests: Recent Labs  Lab 02/13/18 0536  AST 19  ALT 12*  ALKPHOS 58  BILITOT 0.4  PROT 6.4*  ALBUMIN 2.6*   No results for input(s): LIPASE, AMYLASE in the last 168 hours. No results for input(s): AMMONIA in the last 168 hours.  Cardiac Enzymes: No results for input(s): CKTOTAL, CKMB, CKMBINDEX, TROPONINI in the last 168 hours. BNP (last 3 results) Recent Labs    02/11/18 2307  BNP 267.5*    ProBNP (last 3 results) No results for input(s): PROBNP in the last 8760 hours.    Studies: No results found.  Scheduled Meds: . amLODipine  5 mg Oral Daily  . aspirin EC  81 mg Oral Daily  . azithromycin  500 mg Oral Daily  . carvedilol  6.25 mg Oral BID WC  . enoxaparin (LOVENOX) injection  30 mg Subcutaneous Q24H  . insulin aspart  0-5 Units Subcutaneous QHS  . insulin aspart  0-9 Units Subcutaneous TID WC  . rosuvastatin  20 mg Oral Daily      Time spent: 20 min  [facility] Pager [phone]. If 7PM-7AM, please contact night-coverage at [email or web address], Office  [phone]  password [email or web address]  02/14/2018, 3:38 PM  LOS: 2 days

## 2018-02-14 NOTE — Consult Note (Addendum)
Renal Service Consult Note [facility]  Melissa Howe 02/14/2018 Melissa Howe Requesting Physician:  Dr Melissa Howe  Reason for Consult:  Acute renal failure HPI: The patient is a 67 y.o. year-old with hx of DM2 (30 yrs),  HTN, CKD3 admitted on 4/19 w/ cough x 2 months.  CXR showed questionable RLL infiltrate vs confluence of shadows, no CHF.  She rec'd 1 dose IV lasix 40 mg, IV abx and IV contrast 80 cc for CTA of lungs. Baseline creat was 1.2- 1.4, eGFR 50 - 60 ml/min.  CT chest showed LLL multifocal PNA.  Lasix was dc'd.  She is on home acei but never rec'd it here.  NO other nephrotoxins.  Creat bumped up to 2.6 yest and 2.9 today, from 1.7 on admission.  Last creat Nov 2017 was 1.1- 1.5, back in 2008 the creat was 0.5- 0.8.  Asked to see for AKI.Marland Kitchen     Pt denies hx of nsaid abuse, kidney stones or kidney failure.  +SOB, +cough, no leg edema today.  No CP, no fevers.    ROS  denies CP  no joint pain   no HA  no blurry vision  no rash  no diarrhea  no nausea/ vomiting  no dysuria  no difficulty voiding  no change in urine color    Past Medical History  Past Medical History:  Diagnosis Date  . Diabetes mellitus    a. noncompliant with insulin.  Marland Kitchen Hyperlipemia   . Hypertension    Past Surgical History  Past Surgical History:  Procedure Laterality Date  . APPENDECTOMY    . TONSILLECTOMY    . TUBAL LIGATION     Family History  Family History  Problem Relation Age of Onset  . Hypertension Mother   . Diabetes Mother   . Hypertension Sister    Social History  reports that she quit smoking about 34 years ago. Her smoking use included cigars. She quit after 4.00 years of use. She has never used smokeless tobacco. She reports that she does not drink alcohol or use drugs. Allergies No Known Allergies Home medications Prior to Admission medications   Medication Sig Start Date End Date Taking? Authorizing Provider  amLODipine (NORVASC) 5 MG tablet Take 1  tablet (5 mg total) by mouth daily. 09/08/16  Yes TatShanon Brow, MD  aspirin EC 81 MG tablet Take 1 tablet (81 mg total) by mouth daily. 09/08/16  Yes Melissa, Melissa Brow, MD  carvedilol (COREG) 6.25 MG tablet Take 1 tablet (6.25 mg total) by mouth 2 (two) times daily with a meal. 09/08/16  Yes Melissa, Melissa Brow, MD  Insulin Detemir (LEVEMIR) 100 UNIT/ML Pen Inject 30 Units into the skin daily with breakfast. 09/09/16  Yes Melissa, Melissa Brow, MD  Insulin Pen Needle 32G X 4 MM MISC Use with insulin pen to dispense insulin as directed 09/08/16  Yes Melissa, Melissa Brow, MD  lisinopril (PRINIVIL,ZESTRIL) 5 MG tablet Take 1 tablet (5 mg total) by mouth daily. 09/08/16  Yes Melissa, Melissa Brow, MD  metFORMIN (GLUCOPHAGE) 500 MG tablet Take 2 tablets (1,000 mg total) by mouth 2 (two) times daily with a meal. 07/11/15  Yes Melissa Belling, MD  rosuvastatin (CRESTOR) 20 MG tablet Take 1 tablet (20 mg total) by mouth daily. 09/08/16  Yes Melissa, Melissa Brow, MD  triamcinolone (KENALOG) 0.025 % ointment Apply 1 application topically 2 (two) times daily. Do not apply to face, only apply to affected areas 08/03/17  Yes Melissa, Wenda Overland, MD   Liver Function Tests  Recent Labs  Lab 02/13/18 0536  AST 19  ALT 12*  ALKPHOS 58  BILITOT 0.4  PROT 6.4*  ALBUMIN 2.6*   No results for input(s): LIPASE, AMYLASE in the last 168 hours. CBC Recent Labs  Lab 02/11/18 2307 02/13/18 0536  WBC 14.0* 9.2  NEUTROABS 10.7*  --   HGB 9.3* 8.6*  HCT 27.6* 26.4*  MCV 84.9 84.9  PLT 325 005   Basic Metabolic Panel Recent Labs  Lab 02/11/18 2307 02/13/18 0536 02/14/18 0535  NA 133* 140 140  K 4.2 3.6 3.9  CL 103 109 112*  CO2 20* 22 19*  GLUCOSE 281* 50* 58*  BUN 27* 37* 40*  CREATININE 1.73* 2.57* 2.97*  CALCIUM 8.4* 8.3* 8.1*   Iron/TIBC/Ferritin/ %Sat No results found for: IRON, TIBC, FERRITIN, IRONPCTSAT  Vitals:   02/13/18 2119 02/14/18 0454 02/14/18 0459 02/14/18 0823  BP: (!) 159/82 (!) 187/97  (!) 157/84  Pulse: 79 81  76  Resp: 18 20     Temp: 98.3 F (36.8 C) 98.5 F (36.9 C)    TempSrc:  Oral    SpO2: 98% 96%  100%  Weight:   81.4 kg (179 lb 6.4 oz)   Height:       Exam Gen alert older adult female No rash, cyanosis or gangrene Sclera anicteric, throat clear  No jvd or bruits Chest bibasilar crackles RRR no MRG Abd soft ntnd no mass or ascites +bs GU defer MS no joint effusions or deformity Ext 1+ bilat pretib edema, no wounds or ulcers Neuro is alert, Ox 3 , nf  Home meds: - norvasc 5 qd/ coreg 6.25 bid/ lisinopril 5 qd - crestor / metformin/ levemir 30 qam/ ecasa 81  INpatient meds: norvasc, ecasa, zithromax po, coreg, lovenox sq, lasix 40 iv 4/19, novolog ssi, detemir 30 , lisniopril (never given, dc'd), metformin (dc'd, never given), crestor// IV azithro/ rocephin, tessalon/ tylenol/ robitussin DM/ 80 cc IV contrast on 02/12/18  Na 140  K 3.9  CO2 19  BUN 40  Cr 2.97   eGFR 15- 18    Hb 9.2  Hb 8.6  plt 304 yest CXR 4/18 - questionable infiltrate RLL vs confluence of shadows, no edema I/O - 1.8Lin and 1.0 L out since admit on 4/19 Alb 2.6, LFT's ok , Hb 8.6, wbc/ plts ok CT chest w/ contrast > no PE, LLL multifocal consolidation/ PNA, o/w lungs clear    Impression: 1  Acute renal failure - AKI on CKD3.  Suspect ATN due to IV contrast given on 4/19. No other nephrotoxins noted.  Had acei ordered but never given and has since been dc'd.  BP's are normal to high.  She got IVF"s but they were stopped due to SOB.  Will repeat CXR and give 80 mg IV lasix x 1.   Agree w/ stopping of IVF"s for now.  Check UA and urine lytes, renal US.  Hopefully will recover soon w/o need for RRT.  Have d/w pt and daughter and answered questions.   2  CKD 3 - baseline creat 1.1- 1.5 in 2017 3  DM2 longstanding , 30 yrs on insulin, +hx retinopathy per pt 4  HTN - on norvasc/ coreg here   Plan - as above   Kelly Splinter MD [employer] pager [phone]   02/14/2018, 1:19 PM

## 2018-02-15 ENCOUNTER — Inpatient Hospital Stay (HOSPITAL_COMMUNITY): Payer: Medicare Other

## 2018-02-15 LAB — GLUCOSE, CAPILLARY
Glucose-Capillary: 153 mg/dL — ABNORMAL HIGH (ref 65–99)
Glucose-Capillary: 163 mg/dL — ABNORMAL HIGH (ref 65–99)
Glucose-Capillary: 176 mg/dL — ABNORMAL HIGH (ref 65–99)

## 2018-02-15 LAB — COMPREHENSIVE METABOLIC PANEL
ALT: 12 U/L — ABNORMAL LOW (ref 14–54)
AST: 15 U/L (ref 15–41)
Albumin: 2.6 g/dL — ABNORMAL LOW (ref 3.5–5.0)
Alkaline Phosphatase: 59 U/L (ref 38–126)
Anion gap: 9 (ref 5–15)
BUN: 38 mg/dL — ABNORMAL HIGH (ref 6–20)
CO2: 21 mmol/L — ABNORMAL LOW (ref 22–32)
Calcium: 8.5 mg/dL — ABNORMAL LOW (ref 8.9–10.3)
Chloride: 112 mmol/L — ABNORMAL HIGH (ref 101–111)
Creatinine, Ser: 2.47 mg/dL — ABNORMAL HIGH (ref 0.44–1.00)
GFR calc Af Amer: 22 mL/min — ABNORMAL LOW (ref >60)
GFR calc non Af Amer: 19 mL/min — ABNORMAL LOW (ref >60)
Glucose, Bld: 151 mg/dL — ABNORMAL HIGH (ref 65–99)
Potassium: 3.7 mmol/L (ref 3.5–5.1)
Sodium: 142 mmol/L (ref 135–145)
Total Bilirubin: 0.1 mg/dL — ABNORMAL LOW (ref 0.3–1.2)
Total Protein: 6.3 g/dL — ABNORMAL LOW (ref 6.5–8.1)

## 2018-02-15 LAB — RETICULOCYTES
RBC.: 3.59 MIL/uL — ABNORMAL LOW (ref 3.87–5.11)
Retic Count, Absolute: 50.3 10*3/uL (ref 19.0–186.0)
Retic Ct Pct: 1.4 % (ref 0.4–3.1)

## 2018-02-15 LAB — CBC
HCT: 24.7 % — ABNORMAL LOW (ref 36.0–46.0)
Hemoglobin: 8.1 g/dL — ABNORMAL LOW (ref 12.0–15.0)
MCH: 28 pg (ref 26.0–34.0)
MCHC: 32.8 g/dL (ref 30.0–36.0)
MCV: 85.5 fL (ref 78.0–100.0)
Platelets: 334 10*3/uL (ref 150–400)
RBC: 2.89 MIL/uL — ABNORMAL LOW (ref 3.87–5.11)
RDW: 14.2 % (ref 11.5–15.5)
WBC: 7.2 10*3/uL (ref 4.0–10.5)

## 2018-02-15 LAB — FERRITIN: Ferritin: 214 ng/mL (ref 11–307)

## 2018-02-15 LAB — IRON AND TIBC
Iron: 51 ug/dL (ref 28–170)
Saturation Ratios: 19 % (ref 10.4–31.8)
TIBC: 270 ug/dL (ref 250–450)
UIBC: 219 ug/dL

## 2018-02-15 LAB — FOLATE: Folate: 11.5 ng/mL (ref >5.9)

## 2018-02-15 LAB — VITAMIN B12: Vitamin B-12: 354 pg/mL (ref 180–914)

## 2018-02-15 MED ORDER — HYDRALAZINE HCL 25 MG PO TABS
25.0000 mg | ORAL_TABLET | Freq: Four times a day (QID) | ORAL | Status: DC | PRN
  Administered 2018-02-15 – 2018-02-17 (×3): 25 mg via ORAL
  Filled 2018-02-15 (×3): qty 1

## 2018-02-15 NOTE — Progress Notes (Signed)
[facility]  PROGRESS NOTE  Melissa Howe MEQ:[id number] DOB: 07/22/1951 DOA: 02/11/2018 PCP: System, Pcp Not In   Brief HPI:    67 y.o.female,w hypertension, hyperlipidemia, dm2 c/o cough x 85months. Pt states that cough mostly dry worsened yesterday, as well as increase in dyspnea. Pt denies fever, chills, cp, palp, n/v, diarrhea, brbpr.    Subjective   Patient denies shortness of breath, but coughing.   Assessment/Plan:     1. Community-acquired pneumonia-improved, continue Rocephin and Zithromax.  Influenza is negative, urinary strep pneumo antigen is negative, respiratory panel is negative.  Urine for Legionella is pending. Add Tessalon Perles for coughing. 2. Gram-positive rod bacteremia-1 out of 2 bottles blood cultures positive for gram-positive rods, likely contamination.  Will await for final culture results.  We will not treat for bacteremia  at this time.  Called and discussed with infectious disease Dr. Graylon Good 3. Acute kidney injury-the patient has creatinine of 2.47, improved from 2.97 yesterday, was given Lasix 80 mg x1.  Patient has developed contrast-induced nephropathy, nephrology following. 4. Anemia-patient baseline hemoglobin is around 9.3.  Today hemoglobin dropped to 8.1, will obtain anemia panel, FOBT 5. Hypertension-blood pressure stable, continue amlodipine, carvedilol 6. Diabetes mellitus-continue sliding scale insulin with NovoLog.  Blood glucose well controlled    DVT prophylaxis: Lovenox  Code Status: Full code  Family Communication: No family at bedside  Disposition Plan: likely home when medically ready for discharge   Consultants:  None   Procedures:  None   Antibiotics:   Anti-infectives (From admission, onward)   Start     Dose/Rate Route Frequency Ordered Stop   02/13/18 1000  azithromycin (ZITHROMAX) tablet 500 mg     500 mg Oral Daily 02/12/18 1346     02/12/18 2200  azithromycin (ZITHROMAX) 500 mg in sodium  chloride 0.9 % 250 mL IVPB  Status:  Discontinued     500 mg 250 mL/hr over 60 Minutes Intravenous Every 24 hours 02/12/18 1025 02/12/18 1346   02/12/18 2200  cefTRIAXone (ROCEPHIN) 1 g in sodium chloride 0.9 % 100 mL IVPB     1 g 200 mL/hr over 30 Minutes Intravenous Every 24 hours 02/12/18 1025     02/12/18 0452  azithromycin (ZITHROMAX) 500 MG injection    Note to Pharmacy:  Collene Gobble   : cabinet override      02/12/18 0452 02/12/18 1659   02/12/18 0330  cefTRIAXone (ROCEPHIN) 1 g in sodium chloride 0.9 % 100 mL IVPB     1 g 200 mL/hr over 30 Minutes Intravenous  Once 02/12/18 0327 02/12/18 0545   02/12/18 0330  azithromycin (ZITHROMAX) 500 mg in sodium chloride 0.9 % 250 mL IVPB     500 mg 250 mL/hr over 60 Minutes Intravenous  Once 02/12/18 0327 02/12/18 0616       Objective   Vitals:   02/14/18 0823 02/14/18 1555 02/14/18 2055 02/15/18 0533  BP: (!) 157/84 (!) 162/78 (!) 160/82 (!) 149/76  Pulse: 76 81 80 72  Resp:  20 18 20   Temp:  97.6 F (36.4 C) 98.6 F (37 C) 97.9 F (36.6 C)  TempSrc:  Oral Oral Oral  SpO2: 100% 99% 99% 99%  Weight:      Height:        Intake/Output Summary (Last 24 hours) at 02/15/2018 1312 Last data filed at 02/15/2018 1200 Gross per 24 hour  Intake 581.67 ml  Output 1400 ml  Net -818.33 ml   Filed Weights   02/11/18  2132 02/12/18 0603 02/14/18 0459  Weight: 81.2 kg (179 lb) 78.3 kg (172 lb 11.2 oz) 81.4 kg (179 lb 6.4 oz)     Physical Examination:  Physical Exam: tact  Mouth: Oral mucosa is moist, no lesions on palate,  Neck: Supple, no deformities, masses, or tenderness Lungs: Normal respiratory effort, bilateral crackles at lung bases Heart: Regular rate and rhythm, S1 and S2 normal, no murmurs, rubs auscultated Abdomen: BS normoactive,soft,nondistended,non-tender to palpation,no organomegaly Extremities: No pretibial edema, no erythema, no cyanosis, no clubbing Neuro : Alert and oriented to time, place and person, No  focal deficits        Data Reviewed: I have personally reviewed following labs and imaging studies  CBG: Recent Labs  Lab 02/14/18 0804 02/14/18 1218 02/14/18 1715 02/14/18 2055 02/15/18 0741  GLUCAP 66 164* 249* 228* 153*    CBC: Recent Labs  Lab 02/11/18 2307 02/13/18 0536 02/15/18 0555  WBC 14.0* 9.2 7.2  NEUTROABS 10.7*  --   --   HGB 9.3* 8.6* 8.1*  HCT 27.6* 26.4* 24.7*  MCV 84.9 84.9 85.5  PLT 325 304 409    Basic Metabolic Panel: Recent Labs  Lab 02/11/18 2307 02/13/18 0536 02/14/18 0535 02/15/18 0555  NA 133* 140 140 142  K 4.2 3.6 3.9 3.7  CL 103 109 112* 112*  CO2 20* 22 19* 21*  GLUCOSE 281* 50* 58* 151*  BUN 27* 37* 40* 38*  CREATININE 1.73* 2.57* 2.97* 2.47*  CALCIUM 8.4* 8.3* 8.1* 8.5*    Recent Results (from the past 240 hour(s))  Blood culture (routine x 2)     Status: None (Preliminary result)   Collection Time: 02/12/18  5:18 AM  Result Value Ref Range Status   Specimen Description   Final    BLOOD RIGHT WRIST Performed at [facility], [address] [place], [place] [address]    Special Requests   Final    BOTTLES DRAWN AEROBIC ONLY Blood Culture adequate volume Performed at [facility], [address] [place], [place] [address]    Culture  Setup Time   Final    GRAM POSITIVE RODS AEROBIC BOTTLE ONLY CRITICAL RESULT CALLED TO, READ BACK BY AND VERIFIED WITH: N GLOGOVAC,PHARMD AT 1154 02/13/18 BY L BENFIELD    Culture   Final    GRAM POSITIVE RODS CULTURE REINCUBATED FOR BETTER GROWTH Performed at [facility], [address] [place], [place] [address]    Report Status PENDING  Incomplete  Blood Culture ID Panel (Reflexed)     Status: None   Collection Time: 02/12/18  5:18 AM  Result Value Ref Range Status   Enterococcus species NOT DETECTED NOT DETECTED Final   Listeria monocytogenes NOT DETECTED NOT DETECTED Final   Staphylococcus species NOT DETECTED NOT DETECTED Final    Staphylococcus aureus NOT DETECTED NOT DETECTED Final   Streptococcus species NOT DETECTED NOT DETECTED Final   Streptococcus agalactiae NOT DETECTED NOT DETECTED Final   Streptococcus pneumoniae NOT DETECTED NOT DETECTED Final   Streptococcus pyogenes NOT DETECTED NOT DETECTED Final   Acinetobacter baumannii NOT DETECTED NOT DETECTED Final   Enterobacteriaceae species NOT DETECTED NOT DETECTED Final   Enterobacter cloacae complex NOT DETECTED NOT DETECTED Final   Escherichia coli NOT DETECTED NOT DETECTED Final   Klebsiella oxytoca NOT DETECTED NOT DETECTED Final   Klebsiella pneumoniae NOT DETECTED NOT DETECTED Final   Proteus species NOT DETECTED NOT DETECTED Final   Serratia marcescens NOT  DETECTED NOT DETECTED Final   Haemophilus influenzae NOT DETECTED NOT DETECTED Final   Neisseria meningitidis NOT DETECTED NOT DETECTED Final   Pseudomonas aeruginosa NOT DETECTED NOT DETECTED Final   Candida albicans NOT DETECTED NOT DETECTED Final   Candida glabrata NOT DETECTED NOT DETECTED Final   Candida krusei NOT DETECTED NOT DETECTED Final   Candida parapsilosis NOT DETECTED NOT DETECTED Final   Candida tropicalis NOT DETECTED NOT DETECTED Final    Comment: Performed at [facility], [place] [address] [place], [place] [address]  Respiratory Panel by PCR     Status: None   Collection Time: 02/12/18  9:36 AM  Result Value Ref Range Status   Adenovirus NOT DETECTED NOT DETECTED Final   Coronavirus 229E NOT DETECTED NOT DETECTED Final   Coronavirus HKU1 NOT DETECTED NOT DETECTED Final   Coronavirus NL63 NOT DETECTED NOT DETECTED Final   Coronavirus OC43 NOT DETECTED NOT DETECTED Final   Metapneumovirus NOT DETECTED NOT DETECTED Final   Rhinovirus / Enterovirus NOT DETECTED NOT DETECTED Final   Influenza A NOT DETECTED NOT DETECTED Final   Influenza B NOT DETECTED NOT DETECTED Final   Parainfluenza Virus 1 NOT DETECTED NOT DETECTED Final   Parainfluenza Virus 2 NOT DETECTED NOT  DETECTED Final   Parainfluenza Virus 3 NOT DETECTED NOT DETECTED Final   Parainfluenza Virus 4 NOT DETECTED NOT DETECTED Final   Respiratory Syncytial Virus NOT DETECTED NOT DETECTED Final   Bordetella pertussis NOT DETECTED NOT DETECTED Final   Chlamydophila pneumoniae NOT DETECTED NOT DETECTED Final   Mycoplasma pneumoniae NOT DETECTED NOT DETECTED Final    Comment: Performed at [facility], [place] [address] [place], [place] [address]     Liver Function Tests: Recent Labs  Lab 02/13/18 0536 02/15/18 0555  AST 19 15  ALT 12* 12*  ALKPHOS 58 59  BILITOT 0.4 0.1*  PROT 6.4* 6.3*  ALBUMIN 2.6* 2.6*   No results for input(s): LIPASE, AMYLASE in the last 168 hours. No results for input(s): AMMONIA in the last 168 hours.  Cardiac Enzymes: No results for input(s): CKTOTAL, CKMB, CKMBINDEX, TROPONINI in the last 168 hours. BNP (last 3 results) Recent Labs    02/11/18 2307  BNP 267.5*    ProBNP (last 3 results) No results for input(s): PROBNP in the last 8760 hours.    Studies: Dg Chest 2 View  Result Date: 02/14/2018 CLINICAL DATA:  Shortness of breath. Left lower lobe pneumonia by recent chest CT. EXAM: CHEST - 2 VIEW COMPARISON:  PA and lateral chest 02/11/2018 and CT chest 02/12/2018. FINDINGS: Patchy left basilar airspace disease is not notably changed. The lungs are otherwise clear. No pneumothorax or pleural effusion. Heart size is mildly enlarged. No acute bony abnormality. IMPRESSION: No change in patchy left lower lobe airspace disease most consistent with pneumonia. Electronically Signed   By: Inge Rise M.D.   On: 02/14/2018 15:55   US Renal  Result Date: 02/15/2018 CLINICAL DATA:  Acute kidney injury, hypertension, diabetes EXAM: RENAL / URINARY TRACT ULTRASOUND COMPLETE COMPARISON:  None. FINDINGS: Right Kidney: Length: 11.3 cm. Echogenicity within normal limits. No mass or hydronephrosis visualized. Left Kidney: Length: 12 cm. Echogenicity within  normal limits. No mass or hydronephrosis visualized. Bladder: Appears normal for degree of bladder distention. IMPRESSION: Negative.  No hydronephrosis. Electronically Signed   By: Lucrezia Europe M.D.   On: 02/15/2018 08:21    Scheduled Meds: . amLODipine  5 mg Oral Daily  . aspirin EC  81 mg Oral Daily  . azithromycin  500 mg Oral Daily  . carvedilol  6.25 mg Oral BID WC  . enoxaparin (LOVENOX) injection  30 mg Subcutaneous Q24H  . insulin aspart  0-5 Units Subcutaneous QHS  . insulin aspart  0-9 Units Subcutaneous TID WC  . rosuvastatin  20 mg Oral Daily      Time spent: 20 min  [facility] Pager [phone]. If 7PM-7AM, please contact night-coverage at [email or web address], Office  [phone]  password [email or web address]  02/15/2018, 1:12 PM  LOS: 3 days

## 2018-02-15 NOTE — Progress Notes (Signed)
  Impression: 1  Nonoliguric Acute renal failure - AKI on CKD3.  Suspect ATN due to IV contrast given on 4/19. Improving 2  CKD 3 - baseline creat 1.1- 1.5 in 2017 3  DM2 longstanding , 30 yrs on insulin, +hx retinopathy per pt 4  HTN - on norvasc/ coreg here  Subjective: Interval History: No complaints  Objective: Vital signs in last 24 hours: Temp:  [97.6 F (36.4 C)-98.6 F (37 C)] 97.6 F (36.4 C) (04/22 1515) Pulse Rate:  [72-81] 80 (04/22 1515) Resp:  [18-20] 20 (04/22 1515) BP: (149-169)/(76-82) 169/81 (04/22 1515) SpO2:  [99 %] 99 % (04/22 1515) Weight change:   Intake/Output from previous day: 04/21 0701 - 04/22 0700 In: 701.7 [P.O.:300; I.V.:201.7; IV Piggyback:200] Out: 1000 [Urine:1000] Intake/Output this shift: Total I/O In: 86.7 [I.V.:86.7] Out: 900 [Urine:900]  General appearance: alert and cooperative  Pleasant Lungs clear Cor RRR Obese Ext tr bilat LE edema  Lab Results: Recent Labs    02/13/18 0536 02/15/18 0555  WBC 9.2 7.2  HGB 8.6* 8.1*  HCT 26.4* 24.7*  PLT 304 334   BMET:  Recent Labs    02/14/18 0535 02/15/18 0555  NA 140 142  K 3.9 3.7  CL 112* 112*  CO2 19* 21*  GLUCOSE 58* 151*  BUN 40* 38*  CREATININE 2.97* 2.47*  CALCIUM 8.1* 8.5*   No results for input(s): PTH in the last 72 hours. Iron Studies: No results for input(s): IRON, TIBC, TRANSFERRIN, FERRITIN in the last 72 hours. Studies/Results: Dg Chest 2 View  Result Date: 02/14/2018 CLINICAL DATA:  Shortness of breath. Left lower lobe pneumonia by recent chest CT. EXAM: CHEST - 2 VIEW COMPARISON:  PA and lateral chest 02/11/2018 and CT chest 02/12/2018. FINDINGS: Patchy left basilar airspace disease is not notably changed. The lungs are otherwise clear. No pneumothorax or pleural effusion. Heart size is mildly enlarged. No acute bony abnormality. IMPRESSION: No change in patchy left lower lobe airspace disease most consistent with pneumonia. Electronically Signed   By:  Inge Rise M.D.   On: 02/14/2018 15:55   US Renal  Result Date: 02/15/2018 CLINICAL DATA:  Acute kidney injury, hypertension, diabetes EXAM: RENAL / URINARY TRACT ULTRASOUND COMPLETE COMPARISON:  None. FINDINGS: Right Kidney: Length: 11.3 cm. Echogenicity within normal limits. No mass or hydronephrosis visualized. Left Kidney: Length: 12 cm. Echogenicity within normal limits. No mass or hydronephrosis visualized. Bladder: Appears normal for degree of bladder distention. IMPRESSION: Negative.  No hydronephrosis. Electronically Signed   By: Lucrezia Europe M.D.   On: 02/15/2018 08:21    Scheduled: . amLODipine  5 mg Oral Daily  . aspirin EC  81 mg Oral Daily  . azithromycin  500 mg Oral Daily  . carvedilol  6.25 mg Oral BID WC  . enoxaparin (LOVENOX) injection  30 mg Subcutaneous Q24H  . insulin aspart  0-5 Units Subcutaneous QHS  . insulin aspart  0-9 Units Subcutaneous TID WC  . rosuvastatin  20 mg Oral Daily    LOS: 3 days   Estanislado Emms 02/15/2018,3:36 PM

## 2018-02-16 LAB — BASIC METABOLIC PANEL
Anion gap: 8 (ref 5–15)
BUN: 28 mg/dL — ABNORMAL HIGH (ref 6–20)
CO2: 23 mmol/L (ref 22–32)
Calcium: 8.8 mg/dL — ABNORMAL LOW (ref 8.9–10.3)
Chloride: 111 mmol/L (ref 101–111)
Creatinine, Ser: 1.82 mg/dL — ABNORMAL HIGH (ref 0.44–1.00)
GFR calc Af Amer: 32 mL/min — ABNORMAL LOW (ref >60)
GFR calc non Af Amer: 28 mL/min — ABNORMAL LOW (ref >60)
Glucose, Bld: 161 mg/dL — ABNORMAL HIGH (ref 65–99)
Potassium: 4.1 mmol/L (ref 3.5–5.1)
Sodium: 142 mmol/L (ref 135–145)

## 2018-02-16 LAB — CULTURE, BLOOD (ROUTINE X 2): Special Requests: ADEQUATE

## 2018-02-16 LAB — GLUCOSE, CAPILLARY
Glucose-Capillary: 255 mg/dL — ABNORMAL HIGH (ref 65–99)
Glucose-Capillary: 150 mg/dL — ABNORMAL HIGH (ref 65–99)
Glucose-Capillary: 253 mg/dL — ABNORMAL HIGH (ref 65–99)
Glucose-Capillary: 155 mg/dL — ABNORMAL HIGH (ref 65–99)
Glucose-Capillary: 190 mg/dL — ABNORMAL HIGH (ref 65–99)

## 2018-02-16 LAB — CBC
HCT: 25.8 % — ABNORMAL LOW (ref 36.0–46.0)
Hemoglobin: 8.3 g/dL — ABNORMAL LOW (ref 12.0–15.0)
MCH: 27.4 pg (ref 26.0–34.0)
MCHC: 32.2 g/dL (ref 30.0–36.0)
MCV: 85.1 fL (ref 78.0–100.0)
Platelets: 348 10*3/uL (ref 150–400)
RBC: 3.03 MIL/uL — ABNORMAL LOW (ref 3.87–5.11)
RDW: 14 % (ref 11.5–15.5)
WBC: 8.9 10*3/uL (ref 4.0–10.5)

## 2018-02-16 MED ORDER — CEFDINIR 300 MG PO CAPS
300.0000 mg | ORAL_CAPSULE | Freq: Two times a day (BID) | ORAL | Status: DC
  Administered 2018-02-16 – 2018-02-17 (×3): 300 mg via ORAL
  Filled 2018-02-16 (×3): qty 1

## 2018-02-16 NOTE — Progress Notes (Signed)
BP 154/81 Pulse 80. Maintain current plan of care for Pt

## 2018-02-16 NOTE — Care Management Important Message (Signed)
Important Message  Patient Details  Name: Melissa Howe MRN: [id number] Date of Birth: [birth year]   Medicare Important Message Given:  Yes    Kerin Salen 02/16/2018, 1:18 PMImportant Message  Patient Details  Name: Melissa Howe MRN: [id number] Date of Birth: [birth year]   Medicare Important Message Given:  Yes    Kerin Salen 02/16/2018, 1:18 PM

## 2018-02-16 NOTE — Progress Notes (Signed)
Dr. Darrick Meigs given update regarding Pt's BP 180/92. Pulse 72. Assessment is other wise without any other acute changes noted. New orders to placed.

## 2018-02-16 NOTE — Progress Notes (Addendum)
[facility]  PROGRESS NOTE  Melissa Howe JJH:[id number] DOB: 1951-05-13 DOA: 02/11/2018 PCP: System, Pcp Not In   Brief HPI:    67 y.o.female,w hypertension, hyperlipidemia, dm2 c/o cough x 55months. Pt states that cough mostly dry worsened yesterday, as well as increase in dyspnea. Pt denies fever, chills, cp, palp, n/v, diarrhea, brbpr.    Subjective   Patient seen and examined, denies shortness of breath.  Renal function is slowly improving.  Today is day #5 of IV antibiotics.   Assessment/Plan:     1. Community-acquired pneumonia-improved, she is on  Rocephin and Zithromax, day #5..  Influenza is negative, urinary strep pneumo antigen is negative, respiratory panel is negative.  Urine for Legionella is pending. Add Tessalon Perles for coughing.Will discontinue zithromax and start Po Omnicef. 2. Gram-positive rod bacteremia-1 out of 2 bottles blood cultures positive for gram-positive rodsDIPHTHEROIDS(CORYNEBACTERIUM SPECIES)  FLAVOBACTERIUM ODORATUM  .  Would  will not treat for bacteremia  at this time.  Called and discussed with infectious disease Dr. Emmit Alexanders, who agrees for not treating this, as likely contamination. 3. Acute kidney injury- today creatinine is 1.82, improved from 2.47 yesterday, was given Lasix 80 mg x1.  Patient has developed contrast-induced nephropathy, nephrology has signed off. If creatinine improves by tomorrow, she can be discharged home. 4. Anemia-patient baseline hemoglobin is around 9.3.  Today hemoglobin dropped to 8.3, FOBT is pending.  5. Hypertension-blood pressure high , continue amlodipine, carvedilol. Add hydralazine prn for BP > 160/100 6. Diabetes mellitus-continue sliding scale insulin with NovoLog.  Blood glucose well controlled    DVT prophylaxis: Lovenox  Code Status: Full code  Family Communication: No family at bedside  Disposition Plan: likely home in am   Consultants:  None   Procedures:  None    Antibiotics:   Anti-infectives (From admission, onward)   Start     Dose/Rate Route Frequency Ordered Stop   02/16/18 1100  cefdinir (OMNICEF) capsule 300 mg     300 mg Oral Every 12 hours 02/16/18 1021     02/13/18 1000  azithromycin (ZITHROMAX) tablet 500 mg  Status:  Discontinued     500 mg Oral Daily 02/12/18 1346 02/16/18 1021   02/12/18 2200  azithromycin (ZITHROMAX) 500 mg in sodium chloride 0.9 % 250 mL IVPB  Status:  Discontinued     500 mg 250 mL/hr over 60 Minutes Intravenous Every 24 hours 02/12/18 1025 02/12/18 1346   02/12/18 2200  cefTRIAXone (ROCEPHIN) 1 g in sodium chloride 0.9 % 100 mL IVPB  Status:  Discontinued     1 g 200 mL/hr over 30 Minutes Intravenous Every 24 hours 02/12/18 1025 02/16/18 1021   02/12/18 0452  azithromycin (ZITHROMAX) 500 MG injection    Note to Pharmacy:  Collene Gobble   : cabinet override      02/12/18 0452 02/12/18 1659   02/12/18 0330  cefTRIAXone (ROCEPHIN) 1 g in sodium chloride 0.9 % 100 mL IVPB     1 g 200 mL/hr over 30 Minutes Intravenous  Once 02/12/18 0327 02/12/18 0545   02/12/18 0330  azithromycin (ZITHROMAX) 500 mg in sodium chloride 0.9 % 250 mL IVPB     500 mg 250 mL/hr over 60 Minutes Intravenous  Once 02/12/18 0327 02/12/18 0616       Objective   Vitals:   02/16/18 0500 02/16/18 0603 02/16/18 0919 02/16/18 1529  BP:  (!) 164/82 (!) 158/80 (!) 194/91  Pulse:  72  73  Resp:  16  16  Temp:  98.8 F (37.1 C)  97.6 F (36.4 C)  TempSrc:    Oral  SpO2:  99%  100%  Weight: 81.1 kg (178 lb 12.7 oz)     Height:        Intake/Output Summary (Last 24 hours) at 02/16/2018 1631 Last data filed at 02/16/2018 1529 Gross per 24 hour  Intake 850 ml  Output 1300 ml  Net -450 ml   Filed Weights   02/12/18 0603 02/14/18 0459 02/16/18 0500  Weight: 78.3 kg (172 lb 11.2 oz) 81.4 kg (179 lb 6.4 oz) 81.1 kg (178 lb 12.7 oz)     Physical Examination:   Mouth: Oral mucosa is moist, no lesions on palate,  Neck: Supple, no  deformities, masses, or tenderness Lungs: Normal respiratory effort, bilateral clear to auscultation, no crackles or wheezes.  Heart: Regular rate and rhythm, S1 and S2 normal, no murmurs, rubs auscultated Abdomen: BS normoactive,soft,nondistended,non-tender to palpation,no organomegaly Extremities: No pretibial edema, no erythema, no cyanosis, no clubbing Neuro : Alert and oriented to time, place and person, No focal deficits         Data Reviewed: I have personally reviewed following labs and imaging studies  CBG: Recent Labs  Lab 02/15/18 1207 02/15/18 1713 02/15/18 2036 02/16/18 0736 02/16/18 1154  GLUCAP 255* 163* 176* 150* 253*    CBC: Recent Labs  Lab 02/11/18 2307 02/13/18 0536 02/15/18 0555 02/16/18 0540  WBC 14.0* 9.2 7.2 8.9  NEUTROABS 10.7*  --   --   --   HGB 9.3* 8.6* 8.1* 8.3*  HCT 27.6* 26.4* 24.7* 25.8*  MCV 84.9 84.9 85.5 85.1  PLT 325 304 334 734    Basic Metabolic Panel: Recent Labs  Lab 02/11/18 2307 02/13/18 0536 02/14/18 0535 02/15/18 0555 02/16/18 0540  NA 133* 140 140 142 142  K 4.2 3.6 3.9 3.7 4.1  CL 103 109 112* 112* 111  CO2 20* 22 19* 21* 23  GLUCOSE 281* 50* 58* 151* 161*  BUN 27* 37* 40* 38* 28*  CREATININE 1.73* 2.57* 2.97* 2.47* 1.82*  CALCIUM 8.4* 8.3* 8.1* 8.5* 8.8*    Recent Results (from the past 240 hour(s))  Blood culture (routine x 2)     Status: Abnormal   Collection Time: 02/12/18  5:18 AM  Result Value Ref Range Status   Specimen Description   Final    BLOOD RIGHT WRIST Performed at [facility], [address] [place], [place] [address]    Special Requests   Final    BOTTLES DRAWN AEROBIC ONLY Blood Culture adequate volume Performed at [facility], [address] [place], [place] [address]    Culture  Setup Time   Final    GRAM POSITIVE RODS AEROBIC BOTTLE ONLY CRITICAL RESULT CALLED TO, READ BACK BY AND VERIFIED WITH: N GLOGOVAC,PHARMD AT 1154 02/13/18 BY L BENFIELD     Culture (A)  Final    DIPHTHEROIDS(CORYNEBACTERIUM SPECIES) FLAVOBACTERIUM ODORATUM Standardized susceptibility testing for this organism is not available. Performed at [facility], [place] [address] [place], [place] [address]    Report Status 02/16/2018 FINAL  Final  Blood Culture ID Panel (Reflexed)     Status: None   Collection Time: 02/12/18  5:18 AM  Result Value Ref Range Status   Enterococcus species NOT DETECTED NOT DETECTED Final   Listeria monocytogenes NOT DETECTED NOT DETECTED Final   Staphylococcus species NOT DETECTED NOT DETECTED Final   Staphylococcus aureus NOT DETECTED  NOT DETECTED Final   Streptococcus species NOT DETECTED NOT DETECTED Final   Streptococcus agalactiae NOT DETECTED NOT DETECTED Final   Streptococcus pneumoniae NOT DETECTED NOT DETECTED Final   Streptococcus pyogenes NOT DETECTED NOT DETECTED Final   Acinetobacter baumannii NOT DETECTED NOT DETECTED Final   Enterobacteriaceae species NOT DETECTED NOT DETECTED Final   Enterobacter cloacae complex NOT DETECTED NOT DETECTED Final   Escherichia coli NOT DETECTED NOT DETECTED Final   Klebsiella oxytoca NOT DETECTED NOT DETECTED Final   Klebsiella pneumoniae NOT DETECTED NOT DETECTED Final   Proteus species NOT DETECTED NOT DETECTED Final   Serratia marcescens NOT DETECTED NOT DETECTED Final   Haemophilus influenzae NOT DETECTED NOT DETECTED Final   Neisseria meningitidis NOT DETECTED NOT DETECTED Final   Pseudomonas aeruginosa NOT DETECTED NOT DETECTED Final   Candida albicans NOT DETECTED NOT DETECTED Final   Candida glabrata NOT DETECTED NOT DETECTED Final   Candida krusei NOT DETECTED NOT DETECTED Final   Candida parapsilosis NOT DETECTED NOT DETECTED Final   Candida tropicalis NOT DETECTED NOT DETECTED Final    Comment: Performed at [facility], [place] [address] [place], [place] [address]  Respiratory Panel by PCR     Status: None   Collection Time: 02/12/18  9:36 AM  Result Value Ref  Range Status   Adenovirus NOT DETECTED NOT DETECTED Final   Coronavirus 229E NOT DETECTED NOT DETECTED Final   Coronavirus HKU1 NOT DETECTED NOT DETECTED Final   Coronavirus NL63 NOT DETECTED NOT DETECTED Final   Coronavirus OC43 NOT DETECTED NOT DETECTED Final   Metapneumovirus NOT DETECTED NOT DETECTED Final   Rhinovirus / Enterovirus NOT DETECTED NOT DETECTED Final   Influenza A NOT DETECTED NOT DETECTED Final   Influenza B NOT DETECTED NOT DETECTED Final   Parainfluenza Virus 1 NOT DETECTED NOT DETECTED Final   Parainfluenza Virus 2 NOT DETECTED NOT DETECTED Final   Parainfluenza Virus 3 NOT DETECTED NOT DETECTED Final   Parainfluenza Virus 4 NOT DETECTED NOT DETECTED Final   Respiratory Syncytial Virus NOT DETECTED NOT DETECTED Final   Bordetella pertussis NOT DETECTED NOT DETECTED Final   Chlamydophila pneumoniae NOT DETECTED NOT DETECTED Final   Mycoplasma pneumoniae NOT DETECTED NOT DETECTED Final    Comment: Performed at [facility], [place] [address], [place], [place] [address]     Liver Function Tests: Recent Labs  Lab 02/13/18 0536 02/15/18 0555  AST 19 15  ALT 12* 12*  ALKPHOS 58 59  BILITOT 0.4 0.1*  PROT 6.4* 6.3*  ALBUMIN 2.6* 2.6*   No results for input(s): LIPASE, AMYLASE in the last 168 hours. No results for input(s): AMMONIA in the last 168 hours.  Cardiac Enzymes: No results for input(s): CKTOTAL, CKMB, CKMBINDEX, TROPONINI in the last 168 hours. BNP (last 3 results) Recent Labs    02/11/18 2307  BNP 267.5*    ProBNP (last 3 results) No results for input(s): PROBNP in the last 8760 hours.    Studies: US Renal  Result Date: 02/15/2018 CLINICAL DATA:  Acute kidney injury, hypertension, diabetes EXAM: RENAL / URINARY TRACT ULTRASOUND COMPLETE COMPARISON:  None. FINDINGS: Right Kidney: Length: 11.3 cm. Echogenicity within normal limits. No mass or hydronephrosis visualized. Left Kidney: Length: 12 cm. Echogenicity within normal limits. No mass  or hydronephrosis visualized. Bladder: Appears normal for degree of bladder distention. IMPRESSION: Negative.  No hydronephrosis. Electronically Signed   By: Lucrezia Europe M.D.   On: 02/15/2018 08:21    Scheduled Meds: .  amLODipine  5 mg Oral Daily  . aspirin EC  81 mg Oral Daily  . carvedilol  6.25 mg Oral BID WC  . cefdinir  300 mg Oral Q12H  . enoxaparin (LOVENOX) injection  30 mg Subcutaneous Q24H  . insulin aspart  0-5 Units Subcutaneous QHS  . insulin aspart  0-9 Units Subcutaneous TID WC  . rosuvastatin  20 mg Oral Daily      Time spent: 20 min  [facility] Pager [phone]. If 7PM-7AM, please contact night-coverage at [email or web address], Office  [phone]  password [email or web address]  02/16/2018, 4:31 PM  LOS: 4 days

## 2018-02-16 NOTE — Progress Notes (Signed)
Impression: 1Nonoliguric Acute renal failure - AKI on CKD3. Suspect ATN due to IV contrast given on 4/19.Improved.  We will sign off 2 CKD 3 - baseline creat 1.1- 1.5 in 2017 3 DM2 longstanding , 30 yrs on insulin, +hx retinopathy per pt 4 HTN - on norvasc/ coreg here  Subjective: Interval History: No complaints  Objective: Vital signs in last 24 hours: Temp:  [97.6 F (36.4 C)-98.8 F (37.1 C)] 98.8 F (37.1 C) (04/23 0603) Pulse Rate:  [72-80] 72 (04/23 0603) Resp:  [16-20] 16 (04/23 0603) BP: (164-176)/(81-85) 164/82 (04/23 0603) SpO2:  [99 %-100 %] 99 % (04/23 0603) Weight:  [81.1 kg (178 lb 12.7 oz)] 81.1 kg (178 lb 12.7 oz) (04/23 0500) Weight change:   Intake/Output from previous day: 04/22 0701 - 04/23 0700 In: 1296.7 [P.O.:960; I.V.:236.7; IV Piggyback:100] Out: 2200 [Urine:2200] Intake/Output this shift: No intake/output data recorded.  General appearance: alert and cooperative Resp: clear to auscultation bilaterally Chest wall: no tenderness GI: soft, non-tender; bowel sounds normal; no masses,  no organomegaly Extremities: extremities normal, atraumatic, no cyanosis or edema  Lab Results: Recent Labs    02/15/18 0555 02/16/18 0540  WBC 7.2 8.9  HGB 8.1* 8.3*  HCT 24.7* 25.8*  PLT 334 348   BMET:  Recent Labs    02/15/18 0555 02/16/18 0540  NA 142 142  K 3.7 4.1  CL 112* 111  CO2 21* 23  GLUCOSE 151* 161*  BUN 38* 28*  CREATININE 2.47* 1.82*  CALCIUM 8.5* 8.8*   No results for input(s): PTH in the last 72 hours. Iron Studies:  Recent Labs    02/15/18 1346  IRON 51  TIBC 270  FERRITIN 214   Studies/Results: Dg Chest 2 View  Result Date: 02/14/2018 CLINICAL DATA:  Shortness of breath. Left lower lobe pneumonia by recent chest CT. EXAM: CHEST - 2 VIEW COMPARISON:  PA and lateral chest 02/11/2018 and CT chest 02/12/2018. FINDINGS: Patchy left basilar airspace disease is not notably changed. The lungs are otherwise clear. No  pneumothorax or pleural effusion. Heart size is mildly enlarged. No acute bony abnormality. IMPRESSION: No change in patchy left lower lobe airspace disease most consistent with pneumonia. Electronically Signed   By: Inge Rise M.D.   On: 02/14/2018 15:55   US Renal  Result Date: 02/15/2018 CLINICAL DATA:  Acute kidney injury, hypertension, diabetes EXAM: RENAL / URINARY TRACT ULTRASOUND COMPLETE COMPARISON:  None. FINDINGS: Right Kidney: Length: 11.3 cm. Echogenicity within normal limits. No mass or hydronephrosis visualized. Left Kidney: Length: 12 cm. Echogenicity within normal limits. No mass or hydronephrosis visualized. Bladder: Appears normal for degree of bladder distention. IMPRESSION: Negative.  No hydronephrosis. Electronically Signed   By: Lucrezia Europe M.D.   On: 02/15/2018 08:21    Scheduled: . amLODipine  5 mg Oral Daily  . aspirin EC  81 mg Oral Daily  . azithromycin  500 mg Oral Daily  . carvedilol  6.25 mg Oral BID WC  . enoxaparin (LOVENOX) injection  30 mg Subcutaneous Q24H  . insulin aspart  0-5 Units Subcutaneous QHS  . insulin aspart  0-9 Units Subcutaneous TID WC  . rosuvastatin  20 mg Oral Daily     LOS: 4 days   Estanislado Emms 02/16/2018,8:53 AM

## 2018-02-17 DIAGNOSIS — J189 Pneumonia, unspecified organism: Secondary | ICD-10-CM

## 2018-02-17 LAB — CBC
HCT: 26.5 % — ABNORMAL LOW (ref 36.0–46.0)
Hemoglobin: 8.5 g/dL — ABNORMAL LOW (ref 12.0–15.0)
MCH: 27.3 pg (ref 26.0–34.0)
MCHC: 32.1 g/dL (ref 30.0–36.0)
MCV: 85.2 fL (ref 78.0–100.0)
Platelets: 370 10*3/uL (ref 150–400)
RBC: 3.11 MIL/uL — ABNORMAL LOW (ref 3.87–5.11)
RDW: 14 % (ref 11.5–15.5)
WBC: 8.9 10*3/uL (ref 4.0–10.5)

## 2018-02-17 LAB — BASIC METABOLIC PANEL
Anion gap: 8 (ref 5–15)
BUN: 24 mg/dL — ABNORMAL HIGH (ref 6–20)
CO2: 21 mmol/L — ABNORMAL LOW (ref 22–32)
Calcium: 8.8 mg/dL — ABNORMAL LOW (ref 8.9–10.3)
Chloride: 109 mmol/L (ref 101–111)
Creatinine, Ser: 1.65 mg/dL — ABNORMAL HIGH (ref 0.44–1.00)
GFR calc Af Amer: 36 mL/min — ABNORMAL LOW (ref >60)
GFR calc non Af Amer: 31 mL/min — ABNORMAL LOW (ref >60)
Glucose, Bld: 166 mg/dL — ABNORMAL HIGH (ref 65–99)
Potassium: 4.4 mmol/L (ref 3.5–5.1)
Sodium: 138 mmol/L (ref 135–145)

## 2018-02-17 LAB — GLUCOSE, CAPILLARY
Glucose-Capillary: 152 mg/dL — ABNORMAL HIGH (ref 65–99)
Glucose-Capillary: 308 mg/dL — ABNORMAL HIGH (ref 65–99)

## 2018-02-17 MED ORDER — CEFDINIR 300 MG PO CAPS: 300.0000 mg | ORAL_CAPSULE | Freq: Two times a day (BID) | ORAL | 0 refills | Status: DC

## 2018-02-17 MED ORDER — ENOXAPARIN SODIUM 40 MG/0.4ML ~~LOC~~ SOLN: 40.0000 mg | SUBCUTANEOUS | Status: DC

## 2018-02-17 MED ORDER — BENZONATATE 100 MG PO CAPS: 100.0000 mg | ORAL_CAPSULE | Freq: Three times a day (TID) | ORAL | 0 refills | Status: DC | PRN

## 2018-02-17 MED ORDER — HYDRALAZINE HCL 20 MG/ML IJ SOLN: 10.0000 mg | Freq: Four times a day (QID) | INTRAMUSCULAR | Status: DC | PRN

## 2018-02-17 MED ORDER — GUAIFENESIN-DM 100-10 MG/5ML PO SYRP: 10.0000 mL | ORAL_SOLUTION | ORAL | 0 refills | Status: DC | PRN

## 2018-02-17 NOTE — Care Management Note (Signed)
Case Management Note  Patient Details  Name: Melissa Howe MRN: [id number] Date of Birth: December 09, 1950  Subjective/Objective:   CM referral for pcp-provided patient w/[facility] listing-patient will call own own for pcp-patient voiced understanding. No further CM needs.                 Action/Plan:d/c home.   Expected Discharge Date:  02/17/18               Expected Discharge Plan:  Home/Self Care  In-House Referral:     Discharge planning Services  CM Consult, Other - See comment(PCP listing)  Post Acute Care Choice:    Choice offered to:     DME Arranged:    DME Agency:     HH Arranged:    HH Agency:     Status of Service:  Completed, signed off  If discussed at [employer] of Stay Meetings, dates discussed:    Additional Comments:  Dessa Phi, RN 02/17/2018, 3:10 PM

## 2018-02-17 NOTE — Discharge Summary (Signed)
Physician Discharge Summary  Melissa Howe QJJ:[id number] DOB: 04-03-1951 DOA: 02/11/2018  PCP: System, Pcp Not In  Admit date: 02/11/2018 Discharge date: 02/17/2018  Admitted From: Home Disposition: Home  Recommendations for Outpatient Follow-up:  1. Follow up with PCP in 1-2 weeks 2. Follow up with Nephrology as an outpatient  3. Repeat chest x-ray in 3-6 weeks. 4. Please obtain CMP/CBC, Mag, Phos in one week 5. Please follow up on the following pending results:  Home Health: No Equipment/Devices: Stable    Discharge Condition: Stable CODE STATUS: FULL CODE  Diet recommendation: Heart Healthy/Carb Modified   Brief/Interim Summary: The patient is a 67 y.o.female,w hypertension, hyperlipidemia, dm2 c/o cough x 40months. Pt states that cough mostly dry worsened yesterday, as well as increase in dyspnea. Pt denies fever, chills, cp, palp, n/v, diarrhea, brbpr. Was found to have a CAP and treated with Rocephin and Zithromax and was transitioned to po Omnicef. She received Contrast for a CTA of the Chest and developed Contrast-induced nephropathy. Creatinine steadily improved and renal function was close to baseline.  She is deemed medically stable to be discharged home she will need to follow-up with PCP, as well as nephrology as an outpatient.  Discharge Diagnoses:  Active Problems:   CAP (community acquired pneumonia)  Community-Acquired Pneumonia -Improved, she was on Rocephin and Zithromax but transitioned to po Omnicef for completion of course.  -Influenza is negative, urinary strep pneumo antigen is negative,  -Respiratory panel is negative.  Urine for Legionella is negative.  -Added Tessalon Perles for coughing. -Repeat chest x-ray in 3-6 weeks. -Continue with Robitussin-DM 10 mL p.o. every 4 as needed for cough as not relieved by [name] -Walk Screen done and patient has no O2 needs as her O2 Sats remained >95% on Room Air   Gram-positive rod bacteremia -1 out  of 2 bottles blood cultures positive for gram-positive rods DIPHTHEROIDS(CORYNEBACTERIUM SPECIES)  FLAVOBACTERIUM ODORATUM.   -Would will not treat for bacteremia  at this time.  -Dr. Darrick Meigs called and discussed with Infectious Disease Dr. Linus Salmons agrees for not treating this, as likely contamination.  Acute Kidney Injury on CKD Stage 3 -Patient had developed contrast-induced nephropathy, -Nephrology has signed off.  -Baseline Cr ranging from 1.1-1.5 -BUN/Cr improved to 24/1.65 -Resume Home Lisinopril 5 mg p.o. daily in a.m. -Repeat CMP as an outpatient   Normocytic Anemia -Patient baseline hemoglobin is around 9.3. -Anemia panel showed iron level 51, U IBC of 219, TIBC of 270, saturation ratios of ankle, ferritin level 214, folate level of 11.5, vitamin B12 of 354 -Hemoglobin hematocrit has been stable and has improved to 8.5/26.5. -Reticulocyte Count percentage is 1.4 and absolute reticulocyte count was 50.3 -FOBT ordered but never collected -continue monitor for signs and symptoms of bleeding -Follow-up with PCP and repeat CBC as outpatient  Hypertension -BP was elevated this AM -Continue Amlodipine 5 mg po Daily, Carvedilol 6.25 mg po BID.  -Lisinopril was held due to AKI on CKD Stage 3 and ok to resume in AM -Added Hydralazine 25 mg q6hprn for BP > 160/100 while hospitalized. -The blood pressure management per primary care physician  Diabetes Mellitus -Continue sliding scale insulin with Sensitive NovoLog SSI AC/HS while hospitalized. -Stop metformin at discharge given kidney function.  -CBG's ranging from 152-308 -Continue with insulin detemir 30 units subcu daily with breakfast -Follow-up with PCP for blood sugar management  Hyperlipidemia/Dyslipidemia -Continue Rosuvastatin 20 g p.o. daily  Discharge Instructions Discharge Instructions    Call MD for:  difficulty breathing, headache  or visual disturbances   Complete by:  As directed    Call MD for:  extreme fatigue    Complete by:  As directed    Call MD for:  hives   Complete by:  As directed    Call MD for:  persistant dizziness or light-headedness   Complete by:  As directed    Call MD for:  persistant nausea and vomiting   Complete by:  As directed    Call MD for:  redness, tenderness, or signs of infection (pain, swelling, redness, odor or green/yellow discharge around incision site)   Complete by:  As directed    Call MD for:  severe uncontrolled pain   Complete by:  As directed    Call MD for:  temperature >100.4   Complete by:  As directed    Diet - low sodium heart healthy   Complete by:  As directed    Diet Carb Modified   Complete by:  As directed    Discharge instructions   Complete by:  As directed    Follow up with PCP and establish with Nephrology outpatient.  Take all medications as prescribed.  If symptoms change or worsen please return to the emergency room for evaluation.   Increase activity slowly   Complete by:  As directed      Allergies as of 02/17/2018   No Known Allergies     Medication List    STOP taking these medications   metFORMIN 500 MG tablet Commonly known as:  GLUCOPHAGE     TAKE these medications   amLODipine 5 MG tablet Commonly known as:  NORVASC Take 1 tablet (5 mg total) by mouth daily.   aspirin EC 81 MG tablet Take 1 tablet (81 mg total) by mouth daily.   benzonatate 100 MG capsule Commonly known as:  TESSALON Take 1 capsule (100 mg total) by mouth 3 (three) times daily as needed for cough.   carvedilol 6.25 MG tablet Commonly known as:  COREG Take 1 tablet (6.25 mg total) by mouth 2 (two) times daily with a meal.   cefdinir 300 MG capsule Commonly known as:  OMNICEF Take 1 capsule (300 mg total) by mouth every 12 (twelve) hours.   guaiFENesin-dextromethorphan 100-10 MG/5ML syrup Commonly known as:  ROBITUSSIN DM Take 10 mLs by mouth every 4 (four) hours as needed for cough (not relieved by tessalon).   Insulin Detemir 100 UNIT/ML  Pen Commonly known as:  LEVEMIR Inject 30 Units into the skin daily with breakfast.   Insulin Pen Needle 32G X 4 MM Misc Use with insulin pen to dispense insulin as directed   lisinopril 5 MG tablet Commonly known as:  PRINIVIL Take 1 tablet (5 mg total) by mouth daily.   rosuvastatin 20 MG tablet Commonly known as:  CRESTOR Take 1 tablet (20 mg total) by mouth daily.   triamcinolone 0.025 % ointment Commonly known as:  KENALOG Apply 1 application topically 2 (two) times daily. Do not apply to face, only apply to affected areas       No Known Allergies  Consultations:  Nephrology  Case was Discussed with ID Physician  Procedures/Studies: Dg Chest 2 View  Result Date: 02/14/2018 CLINICAL DATA:  Shortness of breath. Left lower lobe pneumonia by recent chest CT. EXAM: CHEST - 2 VIEW COMPARISON:  PA and lateral chest 02/11/2018 and CT chest 02/12/2018. FINDINGS: Patchy left basilar airspace disease is not notably changed. The lungs are otherwise clear. No pneumothorax or  pleural effusion. Heart size is mildly enlarged. No acute bony abnormality. IMPRESSION: No change in patchy left lower lobe airspace disease most consistent with pneumonia. Electronically Signed   By: Inge Rise M.D.   On: 02/14/2018 15:55   Dg Chest 2 View  Result Date: 02/11/2018 CLINICAL DATA:  Cough x2 months EXAM: CHEST - 2 VIEW COMPARISON:  09/06/2016 and 02/24/2016 FINDINGS: Heart size is normal. No aortic aneurysm. Pulmonary vascular congestion is seen since prior. Pulmonary opacity in the right lower lobe is new and may reflect a small focus of pneumonia versus superimposition of pulmonary vasculature and osseous elements. IMPRESSION: Mild pulmonary vascular congestion since prior. Pulmonary opacity at the right lung base may represent superimposition of pulmonary vasculature and ribs. A small focus of pneumonia is not entirely excluded given history of cough. This appears new since prior comparisons  dating back through 02/24/2016. Electronically Signed   By: Ashley Royalty M.D.   On: 02/11/2018 22:35   Ct Angio Chest Pe W And/or Wo Contrast  Result Date: 02/12/2018 CLINICAL DATA:  Subacute onset of cough and shortness of breath. Elevated D-dimer. EXAM: CT ANGIOGRAPHY CHEST WITH CONTRAST TECHNIQUE: Multidetector CT imaging of the chest was performed using the standard protocol during bolus administration of intravenous contrast. Multiplanar CT image reconstructions and MIPs were obtained to evaluate the vascular anatomy. CONTRAST:  66mL ISOVUE-370 IOPAMIDOL (ISOVUE-370) INJECTION 76% COMPARISON:  Chest radiograph performed 02/11/2018 FINDINGS: Cardiovascular:  There is no evidence of pulmonary embolus. The heart is normal in size. The thoracic aorta is grossly unremarkable. The great vessels are within normal limits. Mediastinum/Nodes: The mediastinum is unremarkable in appearance. No mediastinal lymphadenopathy is seen. No pericardial effusion is identified. The thyroid gland is unremarkable. No axillary lymphadenopathy is seen. Lungs/Pleura: Patchy left lower lobe airspace opacification is compatible with pneumonia. Additional minimal airspace opacities are noted bilaterally. Underlying peribronchial thickening is noted. No pleural effusion or pneumothorax is seen. Upper Abdomen: The visualized portions of the liver and spleen are unremarkable. The visualized portions of the gallbladder, pancreas, adrenal glands and right kidney are within normal limits. There is minimal nonspecific left-sided perinephric stranding. Musculoskeletal: No acute osseous abnormalities are identified. There is mild chronic loss of height at vertebral body T12. The visualized musculature is unremarkable in appearance. Review of the MIP images confirms the above findings. IMPRESSION: 1. No evidence of pulmonary embolus. 2. Patchy left lower lobe pneumonia. Additional minimal bilateral airspace opacities seen. Underlying peribronchial  thickening noted. 3. Mild chronic loss of height at vertebral body T12. Electronically Signed   By: Garald Balding M.D.   On: 02/12/2018 03:23   US Renal  Result Date: 02/15/2018 CLINICAL DATA:  Acute kidney injury, hypertension, diabetes EXAM: RENAL / URINARY TRACT ULTRASOUND COMPLETE COMPARISON:  None. FINDINGS: Right Kidney: Length: 11.3 cm. Echogenicity within normal limits. No mass or hydronephrosis visualized. Left Kidney: Length: 12 cm. Echogenicity within normal limits. No mass or hydronephrosis visualized. Bladder: Appears normal for degree of bladder distention. IMPRESSION: Negative.  No hydronephrosis. Electronically Signed   By: Lucrezia Europe M.D.   On: 02/15/2018 08:21    Subjective: Seen and examined with him at bedside and she was improved.  Had no nausea, vomiting, chest pain, shortness of breath.  Felt well and ambulated well with her O2 saturations being greater than 92%.  Discharge Exam: Vitals:   02/17/18 0943 02/17/18 1315  BP: (!) 154/70   Pulse: 75 88  Resp:    Temp:    SpO2:  95%  Vitals:   02/17/18 0446 02/17/18 0628 02/17/18 0943 02/17/18 1315  BP: (!) 168/82 (!) 186/84 (!) 154/70   Pulse: 72 75 75 88  Resp: 18     Temp: 98.1 F (36.7 C)     TempSrc:      SpO2: 100% 94%  95%  Weight: 79 kg (174 lb 2.6 oz)     Height:       General: Pt is alert, awake, not in acute distress Cardiovascular: RRR, S1/S2 +, no rubs, no gallops Respiratory: Diminished bilaterally, no wheezing, no rhonchi Abdominal: Soft, NT, ND, bowel sounds + Extremities: no edema, no cyanosis  The results of significant diagnostics from this hospitalization (including imaging, microbiology, ancillary and laboratory) are listed below for reference.    Microbiology: Recent Results (from the past 240 hour(s))  Blood culture (routine x 2)     Status: Abnormal   Collection Time: 02/12/18  5:18 AM  Result Value Ref Range Status   Specimen Description   Final    BLOOD RIGHT WRIST Performed at  [facility], [place] [address] [place], [place] [address]    Special Requests   Final    BOTTLES DRAWN AEROBIC ONLY Blood Culture adequate volume Performed at [facility], [address] [place], [place] [address]    Culture  Setup Time   Final    GRAM POSITIVE RODS AEROBIC BOTTLE ONLY CRITICAL RESULT CALLED TO, READ BACK BY AND VERIFIED WITH: N GLOGOVAC,PHARMD AT 1154 02/13/18 BY L BENFIELD    Culture (A)  Final    DIPHTHEROIDS(CORYNEBACTERIUM SPECIES) FLAVOBACTERIUM ODORATUM Standardized susceptibility testing for this organism is not available. Performed at [facility], [place] [address], [place], [place] [address]    Report Status 02/16/2018 FINAL  Final  Blood Culture ID Panel (Reflexed)     Status: None   Collection Time: 02/12/18  5:18 AM  Result Value Ref Range Status   Enterococcus species NOT DETECTED NOT DETECTED Final   Listeria monocytogenes NOT DETECTED NOT DETECTED Final   Staphylococcus species NOT DETECTED NOT DETECTED Final   Staphylococcus aureus NOT DETECTED NOT DETECTED Final   Streptococcus species NOT DETECTED NOT DETECTED Final   Streptococcus agalactiae NOT DETECTED NOT DETECTED Final   Streptococcus pneumoniae NOT DETECTED NOT DETECTED Final   Streptococcus pyogenes NOT DETECTED NOT DETECTED Final   Acinetobacter baumannii NOT DETECTED NOT DETECTED Final   Enterobacteriaceae species NOT DETECTED NOT DETECTED Final   Enterobacter cloacae complex NOT DETECTED NOT DETECTED Final   Escherichia coli NOT DETECTED NOT DETECTED Final   Klebsiella oxytoca NOT DETECTED NOT DETECTED Final   Klebsiella pneumoniae NOT DETECTED NOT DETECTED Final   Proteus species NOT DETECTED NOT DETECTED Final   Serratia marcescens NOT DETECTED NOT DETECTED Final   Haemophilus influenzae NOT DETECTED NOT DETECTED Final   Neisseria meningitidis NOT DETECTED NOT DETECTED Final   Pseudomonas aeruginosa NOT DETECTED NOT DETECTED Final   Candida albicans NOT  DETECTED NOT DETECTED Final   Candida glabrata NOT DETECTED NOT DETECTED Final   Candida krusei NOT DETECTED NOT DETECTED Final   Candida parapsilosis NOT DETECTED NOT DETECTED Final   Candida tropicalis NOT DETECTED NOT DETECTED Final    Comment: Performed at [facility], [place] [address], [place], [place] [address]  Respiratory Panel by PCR     Status: None   Collection Time: 02/12/18  9:36 AM  Result Value Ref Range Status   Adenovirus NOT DETECTED NOT  DETECTED Final   Coronavirus 229E NOT DETECTED NOT DETECTED Final   Coronavirus HKU1 NOT DETECTED NOT DETECTED Final   Coronavirus NL63 NOT DETECTED NOT DETECTED Final   Coronavirus OC43 NOT DETECTED NOT DETECTED Final   Metapneumovirus NOT DETECTED NOT DETECTED Final   Rhinovirus / Enterovirus NOT DETECTED NOT DETECTED Final   Influenza A NOT DETECTED NOT DETECTED Final   Influenza B NOT DETECTED NOT DETECTED Final   Parainfluenza Virus 1 NOT DETECTED NOT DETECTED Final   Parainfluenza Virus 2 NOT DETECTED NOT DETECTED Final   Parainfluenza Virus 3 NOT DETECTED NOT DETECTED Final   Parainfluenza Virus 4 NOT DETECTED NOT DETECTED Final   Respiratory Syncytial Virus NOT DETECTED NOT DETECTED Final   Bordetella pertussis NOT DETECTED NOT DETECTED Final   Chlamydophila pneumoniae NOT DETECTED NOT DETECTED Final   Mycoplasma pneumoniae NOT DETECTED NOT DETECTED Final    Comment: Performed at [facility], [place] [address], [place], [place] [address]     Labs: BNP (last 3 results) Recent Labs    02/11/18 2307  BNP 166.0*   Basic Metabolic Panel: Recent Labs  Lab 02/13/18 0536 02/14/18 0535 02/15/18 0555 02/16/18 0540 02/17/18 0521  NA 140 140 142 142 138  K 3.6 3.9 3.7 4.1 4.4  CL 109 112* 112* 111 109  CO2 22 19* 21* 23 21*  GLUCOSE 50* 58* 151* 161* 166*  BUN 37* 40* 38* 28* 24*  CREATININE 2.57* 2.97* 2.47* 1.82* 1.65*  CALCIUM 8.3* 8.1* 8.5* 8.8* 8.8*   Liver Function Tests: Recent Labs  Lab  02/13/18 0536 02/15/18 0555  AST 19 15  ALT 12* 12*  ALKPHOS 58 59  BILITOT 0.4 0.1*  PROT 6.4* 6.3*  ALBUMIN 2.6* 2.6*   No results for input(s): LIPASE, AMYLASE in the last 168 hours. No results for input(s): AMMONIA in the last 168 hours. CBC: Recent Labs  Lab 02/11/18 2307 02/13/18 0536 02/15/18 0555 02/16/18 0540 02/17/18 0521  WBC 14.0* 9.2 7.2 8.9 8.9  NEUTROABS 10.7*  --   --   --   --   HGB 9.3* 8.6* 8.1* 8.3* 8.5*  HCT 27.6* 26.4* 24.7* 25.8* 26.5*  MCV 84.9 84.9 85.5 85.1 85.2  PLT 325 304 334 348 370   Cardiac Enzymes: No results for input(s): CKTOTAL, CKMB, CKMBINDEX, TROPONINI in the last 168 hours. BNP: Invalid input(s): POCBNP CBG: Recent Labs  Lab 02/16/18 1154 02/16/18 1706 02/16/18 2155 02/17/18 0737 02/17/18 1144  GLUCAP 253* 155* 190* 152* 308*   D-Dimer No results for input(s): DDIMER in the last 72 hours. Hgb A1c No results for input(s): HGBA1C in the last 72 hours. Lipid Profile No results for input(s): CHOL, HDL, LDLCALC, TRIG, CHOLHDL, LDLDIRECT in the last 72 hours. Thyroid function studies No results for input(s): TSH, T4TOTAL, T3FREE, THYROIDAB in the last 72 hours.  Invalid input(s): FREET3 Anemia work up Recent Labs    02/15/18 1346  VITAMINB12 354  FOLATE 11.5  FERRITIN 214  TIBC 270  IRON 51  RETICCTPCT 1.4   Urinalysis    Component Value Date/Time   COLORURINE STRAW (A) 02/14/2018 1355   APPEARANCEUR CLEAR 02/14/2018 1355   LABSPEC 1.009 02/14/2018 1355   PHURINE 6.0 02/14/2018 1355   GLUCOSEU 50 (A) 02/14/2018 1355   HGBUR SMALL (A) 02/14/2018 1355   BILIRUBINUR NEGATIVE 02/14/2018 1355   KETONESUR NEGATIVE 02/14/2018 1355   PROTEINUR 100 (A) 02/14/2018 1355   UROBILINOGEN 1.0 07/13/2015 1543   NITRITE NEGATIVE 02/14/2018 1355  LEUKOCYTESUR NEGATIVE 02/14/2018 1355   Sepsis Labs Invalid input(s): PROCALCITONIN,  WBC,  LACTICIDVEN Microbiology Recent Results (from the past 240 hour(s))  Blood culture  (routine x 2)     Status: Abnormal   Collection Time: 02/12/18  5:18 AM  Result Value Ref Range Status   Specimen Description   Final    BLOOD RIGHT WRIST Performed at [facility], [address] [place], [place] [address]    Special Requests   Final    BOTTLES DRAWN AEROBIC ONLY Blood Culture adequate volume Performed at [facility], [address] [place], [place] [address]    Culture  Setup Time   Final    GRAM POSITIVE RODS AEROBIC BOTTLE ONLY CRITICAL RESULT CALLED TO, READ BACK BY AND VERIFIED WITH: N GLOGOVAC,PHARMD AT 1154 02/13/18 BY L BENFIELD    Culture (A)  Final    DIPHTHEROIDS(CORYNEBACTERIUM SPECIES) FLAVOBACTERIUM ODORATUM Standardized susceptibility testing for this organism is not available. Performed at [facility], [place] [address] [place], [place] [address]    Report Status 02/16/2018 FINAL  Final  Blood Culture ID Panel (Reflexed)     Status: None   Collection Time: 02/12/18  5:18 AM  Result Value Ref Range Status   Enterococcus species NOT DETECTED NOT DETECTED Final   Listeria monocytogenes NOT DETECTED NOT DETECTED Final   Staphylococcus species NOT DETECTED NOT DETECTED Final   Staphylococcus aureus NOT DETECTED NOT DETECTED Final   Streptococcus species NOT DETECTED NOT DETECTED Final   Streptococcus agalactiae NOT DETECTED NOT DETECTED Final   Streptococcus pneumoniae NOT DETECTED NOT DETECTED Final   Streptococcus pyogenes NOT DETECTED NOT DETECTED Final   Acinetobacter baumannii NOT DETECTED NOT DETECTED Final   Enterobacteriaceae species NOT DETECTED NOT DETECTED Final   Enterobacter cloacae complex NOT DETECTED NOT DETECTED Final   Escherichia coli NOT DETECTED NOT DETECTED Final   Klebsiella oxytoca NOT DETECTED NOT DETECTED Final   Klebsiella pneumoniae NOT DETECTED NOT DETECTED Final   Proteus species NOT DETECTED NOT DETECTED Final   Serratia marcescens NOT DETECTED NOT DETECTED Final   Haemophilus influenzae  NOT DETECTED NOT DETECTED Final   Neisseria meningitidis NOT DETECTED NOT DETECTED Final   Pseudomonas aeruginosa NOT DETECTED NOT DETECTED Final   Candida albicans NOT DETECTED NOT DETECTED Final   Candida glabrata NOT DETECTED NOT DETECTED Final   Candida krusei NOT DETECTED NOT DETECTED Final   Candida parapsilosis NOT DETECTED NOT DETECTED Final   Candida tropicalis NOT DETECTED NOT DETECTED Final    Comment: Performed at [facility], [place] [address], [place], [place] [address]  Respiratory Panel by PCR     Status: None   Collection Time: 02/12/18  9:36 AM  Result Value Ref Range Status   Adenovirus NOT DETECTED NOT DETECTED Final   Coronavirus 229E NOT DETECTED NOT DETECTED Final   Coronavirus HKU1 NOT DETECTED NOT DETECTED Final   Coronavirus NL63 NOT DETECTED NOT DETECTED Final   Coronavirus OC43 NOT DETECTED NOT DETECTED Final   Metapneumovirus NOT DETECTED NOT DETECTED Final   Rhinovirus / Enterovirus NOT DETECTED NOT DETECTED Final   Influenza A NOT DETECTED NOT DETECTED Final   Influenza B NOT DETECTED NOT DETECTED Final   Parainfluenza Virus 1 NOT DETECTED NOT DETECTED Final   Parainfluenza Virus 2 NOT DETECTED NOT DETECTED Final   Parainfluenza Virus 3 NOT DETECTED NOT DETECTED Final   Parainfluenza Virus 4 NOT DETECTED NOT DETECTED Final  Respiratory Syncytial Virus NOT DETECTED NOT DETECTED Final   Bordetella pertussis NOT DETECTED NOT DETECTED Final   Chlamydophila pneumoniae NOT DETECTED NOT DETECTED Final   Mycoplasma pneumoniae NOT DETECTED NOT DETECTED Final    Comment: Performed at [facility], [place] [address], [place], [place] [address]   Time coordinating discharge: 35 minutes  SIGNED:  Kerney Elbe, DO [facility] 02/17/2018, 2:04 PM Pager [phone]  If 7PM-7AM, please contact night-coverage [email or web address] Password [email or web address]

## 2018-02-17 NOTE — Progress Notes (Signed)
Pt ambulated in hallway with NT- O2 Sats remained around 95% RA. Denied SOB. Tolerated well.

## 2018-05-04 ENCOUNTER — Encounter (HOSPITAL_BASED_OUTPATIENT_CLINIC_OR_DEPARTMENT_OTHER): Payer: Self-pay

## 2018-05-04 ENCOUNTER — Emergency Department (HOSPITAL_BASED_OUTPATIENT_CLINIC_OR_DEPARTMENT_OTHER): Payer: Medicare Other

## 2018-05-04 ENCOUNTER — Emergency Department (HOSPITAL_BASED_OUTPATIENT_CLINIC_OR_DEPARTMENT_OTHER)
Admission: EM | Admit: 2018-05-04 | Discharge: 2018-05-04 | Disposition: A | Discharge status: Home / Self Care | Payer: Medicare Other | Attending: Emergency Medicine | Admitting: Emergency Medicine

## 2018-05-04 ENCOUNTER — Other Ambulatory Visit: Payer: Self-pay

## 2018-05-04 DIAGNOSIS — R2242 Localized swelling, mass and lump, left lower limb: Secondary | ICD-10-CM | POA: Diagnosis present

## 2018-05-04 DIAGNOSIS — I10 Essential (primary) hypertension: Secondary | ICD-10-CM

## 2018-05-04 DIAGNOSIS — N189 Chronic kidney disease, unspecified: Secondary | ICD-10-CM | POA: Diagnosis not present

## 2018-05-04 DIAGNOSIS — Z794 Long term (current) use of insulin: Secondary | ICD-10-CM | POA: Insufficient documentation

## 2018-05-04 DIAGNOSIS — I129 Hypertensive chronic kidney disease with stage 1 through stage 4 chronic kidney disease, or unspecified chronic kidney disease: Secondary | ICD-10-CM | POA: Insufficient documentation

## 2018-05-04 DIAGNOSIS — M79605 Pain in left leg: Secondary | ICD-10-CM | POA: Diagnosis not present

## 2018-05-04 DIAGNOSIS — Z79899 Other long term (current) drug therapy: Secondary | ICD-10-CM | POA: Diagnosis not present

## 2018-05-04 DIAGNOSIS — Z7982 Long term (current) use of aspirin: Secondary | ICD-10-CM | POA: Diagnosis not present

## 2018-05-04 DIAGNOSIS — E1122 Type 2 diabetes mellitus with diabetic chronic kidney disease: Secondary | ICD-10-CM | POA: Insufficient documentation

## 2018-05-04 DIAGNOSIS — E1140 Type 2 diabetes mellitus with diabetic neuropathy, unspecified: Secondary | ICD-10-CM | POA: Diagnosis not present

## 2018-05-04 DIAGNOSIS — Z87891 Personal history of nicotine dependence: Secondary | ICD-10-CM | POA: Insufficient documentation

## 2018-05-04 DIAGNOSIS — E1165 Type 2 diabetes mellitus with hyperglycemia: Secondary | ICD-10-CM | POA: Insufficient documentation

## 2018-05-04 DIAGNOSIS — M7989 Other specified soft tissue disorders: Secondary | ICD-10-CM | POA: Insufficient documentation

## 2018-05-04 DIAGNOSIS — D649 Anemia, unspecified: Secondary | ICD-10-CM | POA: Diagnosis not present

## 2018-05-04 DIAGNOSIS — E11319 Type 2 diabetes mellitus with unspecified diabetic retinopathy without macular edema: Secondary | ICD-10-CM | POA: Insufficient documentation

## 2018-05-04 LAB — BASIC METABOLIC PANEL
Anion gap: 9 (ref 5–15)
BUN: 38 mg/dL — ABNORMAL HIGH (ref 8–23)
CO2: 20 mmol/L — ABNORMAL LOW (ref 22–32)
Calcium: 8.8 mg/dL — ABNORMAL LOW (ref 8.9–10.3)
Chloride: 102 mmol/L (ref 98–111)
Creatinine, Ser: 1.85 mg/dL — ABNORMAL HIGH (ref 0.44–1.00)
GFR calc Af Amer: 31 mL/min — ABNORMAL LOW (ref >60)
GFR calc non Af Amer: 27 mL/min — ABNORMAL LOW (ref >60)
Glucose, Bld: 286 mg/dL — ABNORMAL HIGH (ref 70–99)
Potassium: 4.6 mmol/L (ref 3.5–5.1)
Sodium: 131 mmol/L — ABNORMAL LOW (ref 135–145)

## 2018-05-04 LAB — CBC WITH DIFFERENTIAL/PLATELET
Basophils Absolute: 0 10*3/uL (ref 0.0–0.1)
Basophils Relative: 0 %
Eosinophils Absolute: 0.2 10*3/uL (ref 0.0–0.7)
Eosinophils Relative: 2 %
HCT: 30.8 % — ABNORMAL LOW (ref 36.0–46.0)
Hemoglobin: 10.4 g/dL — ABNORMAL LOW (ref 12.0–15.0)
Lymphocytes Relative: 23 %
Lymphs Abs: 2.5 10*3/uL (ref 0.7–4.0)
MCH: 28.1 pg (ref 26.0–34.0)
MCHC: 33.8 g/dL (ref 30.0–36.0)
MCV: 83.2 fL (ref 78.0–100.0)
Monocytes Absolute: 0.8 10*3/uL (ref 0.1–1.0)
Monocytes Relative: 7 %
Neutro Abs: 7.6 10*3/uL (ref 1.7–7.7)
Neutrophils Relative %: 68 %
Platelets: 333 10*3/uL (ref 150–400)
RBC: 3.7 MIL/uL — ABNORMAL LOW (ref 3.87–5.11)
RDW: 13.1 % (ref 11.5–15.5)
WBC: 11 10*3/uL — ABNORMAL HIGH (ref 4.0–10.5)

## 2018-05-04 MED ORDER — LISINOPRIL 10 MG PO TABS: 10.0000 mg | ORAL_TABLET | Freq: Every day | ORAL | 0 refills | Status: DC

## 2018-05-04 MED ORDER — MORPHINE SULFATE (PF) 2 MG/ML IV SOLN
2.0000 mg | Freq: Once | INTRAVENOUS | Status: AC
  Administered 2018-05-04: 2 mg via INTRAVENOUS
  Filled 2018-05-04: qty 1

## 2018-05-04 MED ORDER — FUROSEMIDE 20 MG PO TABS: 20.0000 mg | ORAL_TABLET | Freq: Every day | ORAL | 0 refills | Status: DC

## 2018-05-04 MED ORDER — LISINOPRIL 10 MG PO TABS
5.0000 mg | ORAL_TABLET | Freq: Once | ORAL | Status: AC
  Administered 2018-05-04: 5 mg via ORAL
  Filled 2018-05-04: qty 1

## 2018-05-04 NOTE — ED Triage Notes (Signed)
C/o pain/swellling to left foot x 2 days-denies injury-NAD-steady limping gait

## 2018-05-04 NOTE — Discharge Instructions (Addendum)
Elevate your leg to help with pain and swelling. Use a compression stocking to help with swelling. Take lasix as directed until completed to help with the swelling. Alternate between tylenol and ibuprofen as needed for pain. Follow up with your regular doctor in 1 week for recheck of symptoms. Return to the ER for emergent changes or worsening symptoms.   Also, your blood pressure was elevated today. Eat a low salt/low sodium diet, and take all of your home blood pressure medications as directed; increase your lisinopril to 10mg  daily (so 2 tablets of the 5mg  dose that you already take, or use the prescription given to you today once you run out of the ones you have at home); continue the other medications as directed by your regular doctor. Keep a log of your blood pressure readings from every morning and evening (making sure to give yourself at least 15 minutes of rest prior to checking it) and take it to your doctor's office at your next appointment for ongoing management of your blood pressure. Stay well hydrated and get plenty of rest. Avoid caffeine and other over the counter products that would make your blood pressure go up (such as decongestants, excedrin, etc). Follow up with your regular doctor in 1 week for recheck of symptoms and ongoing management of your blood pressure. Return to the ER for emergent changes or worsening symptoms.

## 2018-05-04 NOTE — ED Notes (Signed)
Pt verbalizes understanding of d/c instructions and denies any further needs at this time. 

## 2018-05-04 NOTE — ED Notes (Signed)
Stuck twice for IV access without success
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
## 2018-05-12 ENCOUNTER — Other Ambulatory Visit: Payer: Self-pay

## 2018-05-12 ENCOUNTER — Inpatient Hospital Stay (HOSPITAL_COMMUNITY)
Admission: EM | Admit: 2018-05-12 | Discharge: 2018-05-14 | DRG: 580 | Disposition: A | Discharge status: Home / Self Care | Payer: Medicare Other | Attending: Family Medicine | Admitting: Family Medicine

## 2018-05-12 ENCOUNTER — Encounter (HOSPITAL_COMMUNITY): Payer: Self-pay | Admitting: Emergency Medicine

## 2018-05-12 ENCOUNTER — Emergency Department (HOSPITAL_COMMUNITY): Payer: Medicare Other

## 2018-05-12 DIAGNOSIS — L02612 Cutaneous abscess of left foot: Secondary | ICD-10-CM | POA: Diagnosis present

## 2018-05-12 DIAGNOSIS — Z9114 Patient's other noncompliance with medication regimen: Secondary | ICD-10-CM

## 2018-05-12 DIAGNOSIS — E785 Hyperlipidemia, unspecified: Secondary | ICD-10-CM | POA: Diagnosis present

## 2018-05-12 DIAGNOSIS — N183 Chronic kidney disease, stage 3 (moderate): Secondary | ICD-10-CM | POA: Diagnosis present

## 2018-05-12 DIAGNOSIS — E11649 Type 2 diabetes mellitus with hypoglycemia without coma: Secondary | ICD-10-CM | POA: Diagnosis present

## 2018-05-12 DIAGNOSIS — L03116 Cellulitis of left lower limb: Secondary | ICD-10-CM

## 2018-05-12 DIAGNOSIS — L089 Local infection of the skin and subcutaneous tissue, unspecified: Secondary | ICD-10-CM | POA: Diagnosis present

## 2018-05-12 DIAGNOSIS — Z7982 Long term (current) use of aspirin: Secondary | ICD-10-CM | POA: Diagnosis not present

## 2018-05-12 DIAGNOSIS — Z8249 Family history of ischemic heart disease and other diseases of the circulatory system: Secondary | ICD-10-CM | POA: Diagnosis not present

## 2018-05-12 DIAGNOSIS — Z794 Long term (current) use of insulin: Secondary | ICD-10-CM

## 2018-05-12 DIAGNOSIS — Z79899 Other long term (current) drug therapy: Secondary | ICD-10-CM | POA: Diagnosis not present

## 2018-05-12 DIAGNOSIS — S90852A Superficial foreign body, left foot, initial encounter: Secondary | ICD-10-CM

## 2018-05-12 DIAGNOSIS — Z833 Family history of diabetes mellitus: Secondary | ICD-10-CM | POA: Diagnosis not present

## 2018-05-12 DIAGNOSIS — D631 Anemia in chronic kidney disease: Secondary | ICD-10-CM | POA: Diagnosis present

## 2018-05-12 DIAGNOSIS — E1122 Type 2 diabetes mellitus with diabetic chronic kidney disease: Secondary | ICD-10-CM | POA: Diagnosis present

## 2018-05-12 DIAGNOSIS — L0390 Cellulitis, unspecified: Secondary | ICD-10-CM | POA: Diagnosis present

## 2018-05-12 DIAGNOSIS — I160 Hypertensive urgency: Secondary | ICD-10-CM | POA: Diagnosis present

## 2018-05-12 DIAGNOSIS — M795 Residual foreign body in soft tissue: Secondary | ICD-10-CM | POA: Diagnosis present

## 2018-05-12 DIAGNOSIS — E1165 Type 2 diabetes mellitus with hyperglycemia: Secondary | ICD-10-CM | POA: Diagnosis present

## 2018-05-12 DIAGNOSIS — Z87891 Personal history of nicotine dependence: Secondary | ICD-10-CM | POA: Diagnosis not present

## 2018-05-12 DIAGNOSIS — L03032 Cellulitis of left toe: Principal | ICD-10-CM | POA: Diagnosis present

## 2018-05-12 LAB — CBC WITH DIFFERENTIAL/PLATELET
Basophils Absolute: 0 10*3/uL (ref 0.0–0.1)
Basophils Relative: 0 %
Eosinophils Absolute: 0.2 10*3/uL (ref 0.0–0.7)
Eosinophils Relative: 1 %
HCT: 32.4 % — ABNORMAL LOW (ref 36.0–46.0)
Hemoglobin: 10.9 g/dL — ABNORMAL LOW (ref 12.0–15.0)
Lymphocytes Relative: 15 %
Lymphs Abs: 2.5 10*3/uL (ref 0.7–4.0)
MCH: 28.2 pg (ref 26.0–34.0)
MCHC: 33.6 g/dL (ref 30.0–36.0)
MCV: 83.7 fL (ref 78.0–100.0)
Monocytes Absolute: 1 10*3/uL (ref 0.1–1.0)
Monocytes Relative: 6 %
Neutro Abs: 12.8 10*3/uL — ABNORMAL HIGH (ref 1.7–7.7)
Neutrophils Relative %: 78 %
Platelets: 474 10*3/uL — ABNORMAL HIGH (ref 150–400)
RBC: 3.87 MIL/uL (ref 3.87–5.11)
RDW: 12.7 % (ref 11.5–15.5)
WBC: 16.5 10*3/uL — ABNORMAL HIGH (ref 4.0–10.5)

## 2018-05-12 LAB — CBG MONITORING, ED: Glucose-Capillary: 398 mg/dL — ABNORMAL HIGH (ref 70–99)

## 2018-05-12 LAB — COMPREHENSIVE METABOLIC PANEL
ALT: 11 U/L (ref 0–44)
AST: 11 U/L — ABNORMAL LOW (ref 15–41)
Albumin: 3.1 g/dL — ABNORMAL LOW (ref 3.5–5.0)
Alkaline Phosphatase: 91 U/L (ref 38–126)
Anion gap: 10 (ref 5–15)
BUN: 38 mg/dL — ABNORMAL HIGH (ref 8–23)
CO2: 26 mmol/L (ref 22–32)
Calcium: 9.2 mg/dL (ref 8.9–10.3)
Chloride: 95 mmol/L — ABNORMAL LOW (ref 98–111)
Creatinine, Ser: 2.09 mg/dL — ABNORMAL HIGH (ref 0.44–1.00)
GFR calc Af Amer: 27 mL/min — ABNORMAL LOW (ref >60)
GFR calc non Af Amer: 23 mL/min — ABNORMAL LOW (ref >60)
Glucose, Bld: 410 mg/dL — ABNORMAL HIGH (ref 70–99)
Potassium: 4.5 mmol/L (ref 3.5–5.1)
Sodium: 131 mmol/L — ABNORMAL LOW (ref 135–145)
Total Bilirubin: 0.6 mg/dL (ref 0.3–1.2)
Total Protein: 7.7 g/dL (ref 6.5–8.1)

## 2018-05-12 LAB — I-STAT CG4 LACTIC ACID, ED: Lactic Acid, Venous: 1 mmol/L (ref 0.5–1.9)

## 2018-05-12 MED ORDER — AMLODIPINE BESYLATE 5 MG PO TABS
5.0000 mg | ORAL_TABLET | Freq: Every day | ORAL | Status: DC
  Administered 2018-05-12 – 2018-05-14 (×3): 5 mg via ORAL
  Filled 2018-05-12 (×3): qty 1

## 2018-05-12 MED ORDER — SODIUM CHLORIDE 0.9 % IV BOLUS
1000.0000 mL | Freq: Once | INTRAVENOUS | Status: AC
  Administered 2018-05-12: 1000 mL via INTRAVENOUS

## 2018-05-12 MED ORDER — VANCOMYCIN HCL IN DEXTROSE 1-5 GM/200ML-% IV SOLN
1000.0000 mg | INTRAVENOUS | Status: DC
  Administered 2018-05-14: 1000 mg via INTRAVENOUS
  Filled 2018-05-12: qty 200

## 2018-05-12 MED ORDER — ONDANSETRON HCL 4 MG PO TABS: 4.0000 mg | ORAL_TABLET | Freq: Four times a day (QID) | ORAL | Status: DC | PRN

## 2018-05-12 MED ORDER — PIPERACILLIN-TAZOBACTAM 3.375 G IVPB 30 MIN: 3.3750 g | Freq: Once | INTRAVENOUS | Status: DC

## 2018-05-12 MED ORDER — CARVEDILOL 6.25 MG PO TABS
6.2500 mg | ORAL_TABLET | Freq: Two times a day (BID) | ORAL | Status: DC
  Administered 2018-05-12 – 2018-05-14 (×4): 6.25 mg via ORAL
  Filled 2018-05-12 (×4): qty 1

## 2018-05-12 MED ORDER — ACETAMINOPHEN 650 MG RE SUPP: 650.0000 mg | Freq: Four times a day (QID) | RECTAL | Status: DC | PRN

## 2018-05-12 MED ORDER — LIDOCAINE HCL (PF) 1 % IJ SOLN
5.0000 mL | Freq: Once | INTRAMUSCULAR | Status: AC
  Administered 2018-05-12: 5 mL
  Filled 2018-05-12: qty 30

## 2018-05-12 MED ORDER — VANCOMYCIN HCL IN DEXTROSE 1-5 GM/200ML-% IV SOLN
1000.0000 mg | Freq: Once | INTRAVENOUS | Status: AC
  Administered 2018-05-12: 1000 mg via INTRAVENOUS
  Filled 2018-05-12: qty 200

## 2018-05-12 MED ORDER — SODIUM CHLORIDE 0.9 % IV BOLUS: 500.0000 mL | Freq: Once | INTRAVENOUS | Status: DC

## 2018-05-12 MED ORDER — PIPERACILLIN-TAZOBACTAM 3.375 G IVPB 30 MIN
3.3750 g | Freq: Once | INTRAVENOUS | Status: AC
  Administered 2018-05-12: 3.375 g via INTRAVENOUS
  Filled 2018-05-12: qty 50

## 2018-05-12 MED ORDER — PIPERACILLIN-TAZOBACTAM 3.375 G IVPB
3.3750 g | Freq: Three times a day (TID) | INTRAVENOUS | Status: DC
  Administered 2018-05-13 – 2018-05-14 (×4): 3.375 g via INTRAVENOUS
  Filled 2018-05-12 (×5): qty 50

## 2018-05-12 MED ORDER — INSULIN DETEMIR 100 UNIT/ML ~~LOC~~ SOLN
20.0000 [IU] | Freq: Every day | SUBCUTANEOUS | Status: DC
  Administered 2018-05-13: 20 [IU] via SUBCUTANEOUS
  Filled 2018-05-12: qty 0.2

## 2018-05-12 MED ORDER — ONDANSETRON HCL 4 MG/2ML IJ SOLN
4.0000 mg | Freq: Four times a day (QID) | INTRAMUSCULAR | Status: DC | PRN
  Administered 2018-05-13 (×2): 4 mg via INTRAVENOUS
  Filled 2018-05-12: qty 2

## 2018-05-12 MED ORDER — INSULIN ASPART 100 UNIT/ML ~~LOC~~ SOLN: 6.0000 [IU] | Freq: Once | SUBCUTANEOUS | Status: DC

## 2018-05-12 MED ORDER — HYDRALAZINE HCL 20 MG/ML IJ SOLN: 5.0000 mg | INTRAMUSCULAR | Status: DC | PRN

## 2018-05-12 MED ORDER — ACETAMINOPHEN 325 MG PO TABS: 650.0000 mg | ORAL_TABLET | Freq: Four times a day (QID) | ORAL | Status: DC | PRN

## 2018-05-12 MED ORDER — INSULIN ASPART 100 UNIT/ML ~~LOC~~ SOLN
0.0000 [IU] | SUBCUTANEOUS | Status: DC
  Administered 2018-05-13: 7 [IU] via SUBCUTANEOUS
  Administered 2018-05-13 (×2): 2 [IU] via SUBCUTANEOUS
  Administered 2018-05-14: 1 [IU] via SUBCUTANEOUS
  Administered 2018-05-14: 2 [IU] via SUBCUTANEOUS

## 2018-05-12 NOTE — ED Notes (Signed)
Bed: WA06 Expected date:  Expected time:  Means of arrival:  Comments: TR7

## 2018-05-12 NOTE — ED Notes (Signed)
cbg [id number]

## 2018-05-12 NOTE — H&P (Signed)
History and Physical    Melissa Howe XBM:[id number] DOB: 1950/12/06 DOA: 05/12/2018  PCP: System, Pcp Not In  Patient coming from: Home.  Chief Complaint: Left foot great toe pain.  HPI: Melissa Howe is a 67 y.o. female with history of hypertension, diabetes mellitus type 2, hyperlipidemia presents with complaints of worsening pain and swelling of the left great toe.  Patient states she walked on a needle last week and since then her left great toe has been worsening pain and swelling.  Has benign subjective feeling of fever chills.  ED Course: In the ER patient had incision and drainage done by the ER physician following which pain improved.  X-rays show foreign body and Dr. Marlou Sa was consulted and Dr. Marlou Sa recommended admission and transferred to [facility].  Patient was started on empiric antibiotics.  Blood sugar in the 400 and blood pressure was also elevated.  Patient states she has not taken any of her home medication today.  NovoLog 6 units was given along with Levemir reduced dose since patient is n.p.o.  Review of Systems: As per HPI, rest all negative.   Past Medical History:  Diagnosis Date  . Diabetes mellitus    a. noncompliant with insulin.  Marland Kitchen Hyperlipemia   . Hypertension     Past Surgical History:  Procedure Laterality Date  . APPENDECTOMY    . TONSILLECTOMY    . TUBAL LIGATION       reports that she quit smoking about 34 years ago. Her smoking use included cigars. She quit after 4.00 years of use. She has never used smokeless tobacco. She reports that she does not drink alcohol or use drugs.  No Known Allergies  Family History  Problem Relation Age of Onset  . Hypertension Mother   . Diabetes Mother   . Hypertension Sister     Prior to Admission medications   Medication Sig Start Date End Date Taking? Authorizing Provider  amLODipine (NORVASC) 5 MG tablet Take 1 tablet (5 mg total) by mouth daily. 09/08/16  Yes TatShanon Brow, MD  aspirin EC 81 MG  tablet Take 1 tablet (81 mg total) by mouth daily. 09/08/16  Yes Tat, Shanon Brow, MD  carvedilol (COREG) 6.25 MG tablet Take 1 tablet (6.25 mg total) by mouth 2 (two) times daily with a meal. 09/08/16  Yes Tat, Shanon Brow, MD  furosemide (LASIX) 20 MG tablet Take 1 tablet (20 mg total) by mouth daily for 3 days. 05/04/18 05/12/18 Yes Street, Mercedes, PA-C  ibuprofen (ADVIL,MOTRIN) 200 MG tablet Take 200 mg by mouth daily as needed for moderate pain.   Yes [provider]  Insulin Detemir (LEVEMIR) 100 UNIT/ML Pen Inject 30 Units into the skin daily with breakfast. 09/09/16  Yes Tat, Shanon Brow, MD  lisinopril (PRINIVIL,ZESTRIL) 10 MG tablet Take 1 tablet (10 mg total) by mouth daily. 05/04/18  Yes Street, Vadito, PA-C  lisinopril-hydrochlorothiazide (PRINZIDE,ZESTORETIC) 20-25 MG tablet Take 1 tablet by mouth daily. 04/04/18  Yes [provider]  rosuvastatin (CRESTOR) 20 MG tablet Take 1 tablet (20 mg total) by mouth daily. 09/08/16  Yes Tat, Shanon Brow, MD  benzonatate (TESSALON) 100 MG capsule Take 1 capsule (100 mg total) by mouth 3 (three) times daily as needed for cough. Patient not taking: Reported on 05/12/2018 02/17/18   Raiford Noble Latif, DO  cefdinir (OMNICEF) 300 MG capsule Take 1 capsule (300 mg total) by mouth every 12 (twelve) hours. Patient not taking: Reported on 05/12/2018 02/17/18   Kerney Elbe, DO  guaiFENesin-dextromethorphan (ROBITUSSIN DM) 100-10 MG/5ML syrup Take 10 mLs by mouth every 4 (four) hours as needed for cough (not relieved by tessalon). Patient not taking: Reported on 05/12/2018 02/17/18   Raiford Noble Latif, DO  Insulin Pen Needle 32G X 4 MM MISC Use with insulin pen to dispense insulin as directed 09/08/16   Tat, Shanon Brow, MD  lisinopril (PRINIVIL,ZESTRIL) 5 MG tablet Take 1 tablet (5 mg total) by mouth daily. Patient not taking: Reported on 05/12/2018 09/08/16   TatShanon Brow, MD  triamcinolone (KENALOG) 0.025 % ointment Apply 1 application topically 2 (two) times daily.  Do not apply to face, only apply to affected areas Patient not taking: Reported on 05/12/2018 08/03/17   Little, Wenda Overland, MD    Physical Exam: Vitals:   05/12/18 1509 05/12/18 1703 05/12/18 2014  BP: (!) 179/94  (!) 190/91  Pulse: 89 97 86  Resp: 18  18  Temp: 99.4 F (37.4 C)  98.8 F (37.1 C)  TempSrc: Oral  Oral  SpO2: 99% 99% 100%      Constitutional: Moderately built and nourished. Vitals:   05/12/18 1509 05/12/18 1703 05/12/18 2014  BP: (!) 179/94  (!) 190/91  Pulse: 89 97 86  Resp: 18  18  Temp: 99.4 F (37.4 C)  98.8 F (37.1 C)  TempSrc: Oral  Oral  SpO2: 99% 99% 100%   Eyes: Anicteric no pallor. ENMT: No discharge from the ears eyes nose or mouth. Neck: No mass felt.  No neck rigidity.  No JVD appreciated. Respiratory: No rhonchi or crepitations. Cardiovascular: S1-S2 heard no murmurs appreciated. Abdomen: Soft nontender bowel sounds present. Musculoskeletal: Left great toe looks swollen with dressing on it. Skin: Left great toe looks swollen with dressing on it. Neurologic: Alert awake oriented to time place and person.  Moves all extremities. Psychiatric: Appears normal per normal affect.   Labs on Admission: I have personally reviewed following labs and imaging studies  CBC: Recent Labs  Lab 05/12/18 1731  WBC 16.5*  NEUTROABS 12.8*  HGB 10.9*  HCT 32.4*  MCV 83.7  PLT 712*   Basic Metabolic Panel: Recent Labs  Lab 05/12/18 1731  NA 131*  K 4.5  CL 95*  CO2 26  GLUCOSE 410*  BUN 38*  CREATININE 2.09*  CALCIUM 9.2   GFR: Estimated Creatinine Clearance: 25.9 mL/min (A) (by C-G formula based on SCr of 2.09 mg/dL (H)). Liver Function Tests: Recent Labs  Lab 05/12/18 1731  AST 11*  ALT 11  ALKPHOS 91  BILITOT 0.6  PROT 7.7  ALBUMIN 3.1*   No results for input(s): LIPASE, AMYLASE in the last 168 hours. No results for input(s): AMMONIA in the last 168 hours. Coagulation Profile: No results for input(s): INR, PROTIME in the  last 168 hours. Cardiac Enzymes: No results for input(s): CKTOTAL, CKMB, CKMBINDEX, TROPONINI in the last 168 hours. BNP (last 3 results) No results for input(s): PROBNP in the last 8760 hours. HbA1C: No results for input(s): HGBA1C in the last 72 hours. CBG: Recent Labs  Lab 05/12/18 2047  GLUCAP 398*   Lipid Profile: No results for input(s): CHOL, HDL, LDLCALC, TRIG, CHOLHDL, LDLDIRECT in the last 72 hours. Thyroid Function Tests: No results for input(s): TSH, T4TOTAL, FREET4, T3FREE, THYROIDAB in the last 72 hours. Anemia Panel: No results for input(s): VITAMINB12, FOLATE, FERRITIN, TIBC, IRON, RETICCTPCT in the last 72 hours. Urine analysis:    Component Value Date/Time   COLORURINE STRAW (A) 02/14/2018 [place] 02/14/2018 1355  LABSPEC 1.009 02/14/2018 1355   PHURINE 6.0 02/14/2018 1355   GLUCOSEU 50 (A) 02/14/2018 1355   HGBUR SMALL (A) 02/14/2018 1355   BILIRUBINUR NEGATIVE 02/14/2018 1355   KETONESUR NEGATIVE 02/14/2018 1355   PROTEINUR 100 (A) 02/14/2018 1355   UROBILINOGEN 1.0 07/13/2015 1543   NITRITE NEGATIVE 02/14/2018 1355   LEUKOCYTESUR NEGATIVE 02/14/2018 1355   Sepsis Labs: @LABRCNTIP (procalcitonin:4,lacticidven:4) )No results found for this or any previous visit (from the past 240 hour(s)).   Radiological Exams on Admission: Dg Ankle Complete Left  Result Date: 05/12/2018 CLINICAL DATA:  67 year old female with a history of left foot and ankle pain EXAM: LEFT ANKLE COMPLETE - 3+ VIEW COMPARISON:  None. FINDINGS: No acute displaced fracture. Degenerative changes of the hindfoot including the tibiotalar joint. Degenerative changes of the midfoot. Enthesopathic changes at the Achilles insertion and plantar fascia insertion. Ankle mortise congruent. Medial calcinosis of the tibial and pedal vessels. No joint effusion. IMPRESSION: Negative for acute bony abnormality. Degenerative changes of the midfoot and hindfoot. Atherosclerosis of the  tibial pedal vessels. Electronically Signed   By: Corrie Mckusick D.O.   On: 05/12/2018 16:02   Dg Foot Complete Left  Result Date: 05/12/2018 CLINICAL DATA:  Pain, diabetes mellitus, hypertension EXAM: LEFT FOOT - COMPLETE 3+ VIEW COMPARISON:  None FINDINGS: Linear metallic foreign body 19 mm long lateral to the proximal phalanx of the great toe. Bones appear demineralized. Mild degenerative changes first MTP joint. No acute fracture, dislocation, or bone destruction. Plantar calcaneal spur. Mild scattered soft tissue swelling and small vessel vascular calcifications. IMPRESSION: Metallic foreign body question needle fragment lateral to the proximal phalanx of the LEFT great toe. Electronically Signed   By: Lavonia Dana M.D.   On: 05/12/2018 16:06      Assessment/Plan Principal Problem:   Cellulitis and abscess of toe of left foot Active Problems:   Cellulitis   Uncontrolled type 2 diabetes mellitus with hyperglycemia (HCC)   Hypertensive urgency    1. Cellulitis and abscess of the left great toe with foreign body -incision and drainage was done in the ER.  Cultures have been sent patient will be placed on empiric antibiotics.  Will be kept n.p.o. in anticipation of patient going to the OR Dr. Marlou Sa orthopedic surgeon was consulted. 2. Diabetes mellitus type 2 uncontrolled with hyperglycemia -patient had not taken her medication I have ordered Levemir 20 units twice daily for now since patient n.p.o. may change back to home dose of 30 units once patient starts eating.  Patient is on sliding scale coverage he has received NovoLog 6 units in the ER.  Closely follow CBGs and metabolic panel to make sure patient is not going into DKA. 3. Hypertension uncontrolled likely secondary to patient not taking medications and also pain related to the foot infection.  Patient is at this time n.p.o. and I have placed patient on IV hydralazine.  4. Kidney disease stage III creatinine appears to be baseline.   Follow metabolic panel. 5. Anemia appears to be chronic likely secondary to renal disease.  Follow CBC. 6. Hyperlipidemia on statins.   DVT prophylaxis: SCDs. Code Status: Full code. Family Communication: Discussed with patient. Disposition Plan: Home. Consults called: Dr. Marlou Sa.  Orthopedics. Admission status: Inpatient.   Rise Patience MD [facility] Pager [phone].  If 7PM-7AM, please contact night-coverage [email or web address] Password [facility]  05/12/2018, 9:21 PM

## 2018-05-12 NOTE — Progress Notes (Signed)
A consult was received from an ED physician for Vancomycin per pharmacy dosing.  The patient's profile has been reviewed for ht/wt/allergies/indication/available labs.   A one time order has been placed for Vancomycin 1gm.  Further antibiotics/pharmacy consults should be ordered by admitting physician if indicated.                       Thank you,  Minda Ditto 05/12/2018  6:58 PM

## 2018-05-12 NOTE — ED Triage Notes (Signed)
Patient here from home with complaints of left foot pain and swelling. Denies trauma.

## 2018-05-12 NOTE — Progress Notes (Addendum)
Pharmacy Antibiotic Note  Melissa Howe is a 67 y.o. female admitted on 05/12/2018 with a retained foreign body in LLE.  Pharmacy has been consulted for Vancomycin & Zosyn dosing.  Plan: Zosyn 3.375g IV q8h (4 hour infusion).  Vancomycin 1gm x1, then 1gm q48 hr Daily SCr    Temp (24hrs), Avg:99.1 F (37.3 C), Min:98.8 F (37.1 C), Max:99.4 F (37.4 C)  Recent Labs  Lab 05/12/18 1731 05/12/18 1942  WBC 16.5*  --   CREATININE 2.09*  --   LATICACIDVEN  --  1.00    Estimated Creatinine Clearance: 25.9 mL/min (A) (by C-G formula based on SCr of 2.09 mg/dL (H)).    No Known Allergies  Antimicrobials this admission: 7/17 Zosyn >>  7/17 Vanc >>   Dose adjustments this admission:  Microbiology results: 7/17 BCx: sent 7/17 WoundCx: sent  Thank you for allowing pharmacy to be a part of this patient's care.  Minda Ditto PharmD Pager [phone] 05/12/2018, 10:03 PM
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
## 2018-05-12 NOTE — ED Provider Notes (Signed)
Medical screening examination/treatment/procedure(s) were conducted as a shared visit with non-physician practitioner(s) and myself.  I personally evaluated the patient during the encounter.  None Patient has had left foot and ankle swelling for approximately 1 week.  She was seen and evaluated DVT study in the emergency department [phone].  Since that pain and swelling have increased.  She was not aware of any trauma.  Patient is alert and nontoxic.  Mental status is clear.  No respiratory distress.  Patient has moderate swelling of the entire left foot and ankle.  She has focus of fluctuant and possibly necrotic changes at the base of the great and second toes.  X-ray identified a very thin needle foreign body in the patient's foot.  She has been unaware of this.  Patient has retained foreign body with advanced surrounding deep space foot infection and erythema and edema to the ankle.  Orthopedics will be consulted by PA-C Law.  I agree with plan of management.     Charlesetta Shanks, MD 05/12/18 1807

## 2018-05-12 NOTE — ED Notes (Signed)
ED TO INPATIENT HANDOFF REPORT  Name/Age/Gender Melissa Howe 67 y.o. female  Code Status    Code Status Orders  (From admission, onward)        Start     Ordered   05/12/18 2120  Full code  Continuous     05/12/18 2121    Code Status History    Date Active Date Inactive Code Status Order ID Comments User Context   02/12/2018 0813 02/17/2018 1956 Full Code [id number]  Jani Gravel, MD Inpatient   09/06/2016 2248 09/08/2016 2212 Full Code [id number]  Lily Kocher, MD ED   07/13/2015 2052 07/15/2015 1441 Full Code [id number]  Ivor Costa, MD Inpatient      Home/SNF/Other Home  Chief Complaint Lt. foot swelling  Level of Care/Admitting Diagnosis ED Disposition    ED Disposition Condition [place]: [facility] [100100]  Level of Care: Med-Surg [16]  Diagnosis: Cellulitis [154008]  Admitting Physician: Rise Patience [[id number]]  Attending Physician: Rise Patience [phone]  Estimated length of stay: past midnight tomorrow  Certification:: I certify this patient will need inpatient services for at least 2 midnights  PT Class (Do Not Modify): Inpatient [101]  PT Acc Code (Do Not Modify): Private [1]       Medical History Past Medical History:  Diagnosis Date  . Diabetes mellitus    a. noncompliant with insulin.  Marland Kitchen Hyperlipemia   . Hypertension     Allergies No Known Allergies  IV Location/Drains/Wounds Patient Lines/Drains/Airways Status   Active Line/Drains/Airways    Name:   Placement date:   Placement time:   Site:   Days:   Peripheral IV 05/12/18 Right Antecubital   05/12/18    1934    Antecubital   less than 1          Labs/Imaging Results for orders placed or performed during the hospital encounter of 05/12/18 (from the past 48 hour(s))  Comprehensive metabolic panel     Status: Abnormal   Collection Time: 05/12/18  5:31 PM  Result Value Ref Range   Sodium 131 (L) 135 - 145 mmol/L   Potassium 4.5 3.5 -  5.1 mmol/L   Chloride 95 (L) 98 - 111 mmol/L    Comment: Please note change in reference range.   CO2 26 22 - 32 mmol/L   Glucose, Bld 410 (H) 70 - 99 mg/dL    Comment: Please note change in reference range.   BUN 38 (H) 8 - 23 mg/dL    Comment: Please note change in reference range.   Creatinine, Ser 2.09 (H) 0.44 - 1.00 mg/dL   Calcium 9.2 8.9 - 10.3 mg/dL   Total Protein 7.7 6.5 - 8.1 g/dL   Albumin 3.1 (L) 3.5 - 5.0 g/dL   AST 11 (L) 15 - 41 U/L   ALT 11 0 - 44 U/L    Comment: Please note change in reference range.   Alkaline Phosphatase 91 38 - 126 U/L   Total Bilirubin 0.6 0.3 - 1.2 mg/dL   GFR calc non Af Amer 23 (L) >60 mL/min   GFR calc Af Amer 27 (L) >60 mL/min    Comment: (NOTE) The eGFR has been calculated using the CKD EPI equation. This calculation has not been validated in all clinical situations. eGFR's persistently <60 mL/min signify possible Chronic Kidney Disease.    Anion gap 10 5 - 15    Comment: Performed at [facility], [place]  [address] [place], [place] [address]  CBC with Differential     Status: Abnormal   Collection Time: 05/12/18  5:31 PM  Result Value Ref Range   WBC 16.5 (H) 4.0 - 10.5 K/uL   RBC 3.87 3.87 - 5.11 MIL/uL   Hemoglobin 10.9 (L) 12.0 - 15.0 g/dL   HCT 32.4 (L) 36.0 - 46.0 %   MCV 83.7 78.0 - 100.0 fL   MCH 28.2 26.0 - 34.0 pg   MCHC 33.6 30.0 - 36.0 g/dL   RDW 12.7 11.5 - 15.5 %   Platelets 474 (H) 150 - 400 K/uL   Neutrophils Relative % 78 %   Neutro Abs 12.8 (H) 1.7 - 7.7 K/uL   Lymphocytes Relative 15 %   Lymphs Abs 2.5 0.7 - 4.0 K/uL   Monocytes Relative 6 %   Monocytes Absolute 1.0 0.1 - 1.0 K/uL   Eosinophils Relative 1 %   Eosinophils Absolute 0.2 0.0 - 0.7 K/uL   Basophils Relative 0 %   Basophils Absolute 0.0 0.0 - 0.1 K/uL    Comment: Performed at [facility], [place] [address] [place],  [address]  I-Stat CG4 Lactic Acid, ED     Status: None   Collection Time: 05/12/18   7:42 PM  Result Value Ref Range   Lactic Acid, Venous 1.00 0.5 - 1.9 mmol/L  POC CBG, ED     Status: Abnormal   Collection Time: 05/12/18  8:47 PM  Result Value Ref Range   Glucose-Capillary 398 (H) 70 - 99 mg/dL   Comment 1 Notify RN    Comment 2 Document in Chart    Dg Ankle Complete Left  Result Date: 05/12/2018 CLINICAL DATA:  67 year old female with a history of left foot and ankle pain EXAM: LEFT ANKLE COMPLETE - 3+ VIEW COMPARISON:  None. FINDINGS: No acute displaced fracture. Degenerative changes of the hindfoot including the tibiotalar joint. Degenerative changes of the midfoot. Enthesopathic changes at the Achilles insertion and plantar fascia insertion. Ankle mortise congruent. Medial calcinosis of the tibial and pedal vessels. No joint effusion. IMPRESSION: Negative for acute bony abnormality. Degenerative changes of the midfoot and hindfoot. Atherosclerosis of the tibial pedal vessels. Electronically Signed   By: Corrie Mckusick D.O.   On: 05/12/2018 16:02   Dg Foot Complete Left  Result Date: 05/12/2018 CLINICAL DATA:  Pain, diabetes mellitus, hypertension EXAM: LEFT FOOT - COMPLETE 3+ VIEW COMPARISON:  None FINDINGS: Linear metallic foreign body 19 mm long lateral to the proximal phalanx of the great toe. Bones appear demineralized. Mild degenerative changes first MTP joint. No acute fracture, dislocation, or bone destruction. Plantar calcaneal spur. Mild scattered soft tissue swelling and small vessel vascular calcifications. IMPRESSION: Metallic foreign body question needle fragment lateral to the proximal phalanx of the LEFT great toe. Electronically Signed   By: Lavonia Dana M.D.   On: 05/12/2018 16:06    Pending Labs Unresulted Labs (From admission, onward)   Start     Ordered   05/13/18 0500  HIV antibody (Routine Testing)  Tomorrow morning,   R     05/12/18 2121   05/13/18 6237  Basic metabolic panel  Tomorrow morning,   R     05/12/18 2121   05/13/18 0500  CBC  Tomorrow  morning,   R     05/12/18 2121   05/12/18 1840  Wound or Superficial Culture  Once,   STAT    Question:  Patient immune status  Answer:  Immunocompromised  05/12/18 1840   05/12/18 1746  Culture, blood (routine x 2)  BLOOD CULTURE X 2,   STAT     05/12/18 1746      Vitals/Pain Today's Vitals   05/12/18 1509 05/12/18 1514 05/12/18 1703 05/12/18 2014  BP: (!) 179/94   (!) 190/91  Pulse: 89  97 86  Resp: 18   18  Temp: 99.4 F (37.4 C)   98.8 F (37.1 C)  TempSrc: Oral   Oral  SpO2: 99%  99% 100%  PainSc:  10-Worst pain ever      Isolation Precautions No active isolations  Medications Medications  vancomycin (VANCOCIN) IVPB 1000 mg/200 mL premix (1,000 mg Intravenous New Bag/Given 05/12/18 2111)  sodium chloride 0.9 % bolus 1,000 mL (1,000 mLs Intravenous New Bag/Given 05/12/18 2112)  amLODipine (NORVASC) tablet 5 mg (has no administration in time range)  carvedilol (COREG) tablet 6.25 mg (has no administration in time range)  Insulin Detemir (LEVEMIR) FlexPen 20 Units (has no administration in time range)  acetaminophen (TYLENOL) tablet 650 mg (has no administration in time range)    Or  acetaminophen (TYLENOL) suppository 650 mg (has no administration in time range)  ondansetron (ZOFRAN) tablet 4 mg (has no administration in time range)    Or  ondansetron (ZOFRAN) injection 4 mg (has no administration in time range)  piperacillin-tazobactam (ZOSYN) IVPB 3.375 g (has no administration in time range)  insulin aspart (novoLOG) injection 0-9 Units (has no administration in time range)  hydrALAZINE (APRESOLINE) injection 5 mg (has no administration in time range)  lidocaine (PF) (XYLOCAINE) 1 % injection 5 mL (5 mLs Infiltration Given by Other 05/12/18 1832)  piperacillin-tazobactam (ZOSYN) IVPB 3.375 g (0 g Intravenous Stopped 05/12/18 2050)    Mobility walks with device

## 2018-05-13 ENCOUNTER — Inpatient Hospital Stay (HOSPITAL_COMMUNITY): Payer: Medicare Other | Admitting: Certified Registered"

## 2018-05-13 ENCOUNTER — Encounter (HOSPITAL_COMMUNITY): Payer: Self-pay | Admitting: Certified Registered"

## 2018-05-13 ENCOUNTER — Encounter (HOSPITAL_COMMUNITY)
Admission: EM | Disposition: A | Discharge status: Home / Self Care | Payer: Self-pay | Source: Home / Self Care | Attending: Family Medicine

## 2018-05-13 DIAGNOSIS — L089 Local infection of the skin and subcutaneous tissue, unspecified: Secondary | ICD-10-CM | POA: Diagnosis present

## 2018-05-13 HISTORY — PX: FOREIGN BODY REMOVAL: SHX962

## 2018-05-13 LAB — GLUCOSE, CAPILLARY
Glucose-Capillary: 112 mg/dL — ABNORMAL HIGH (ref 70–99)
Glucose-Capillary: 118 mg/dL — ABNORMAL HIGH (ref 70–99)
Glucose-Capillary: 189 mg/dL — ABNORMAL HIGH (ref 70–99)
Glucose-Capillary: 194 mg/dL — ABNORMAL HIGH (ref 70–99)
Glucose-Capillary: 343 mg/dL — ABNORMAL HIGH (ref 70–99)
Glucose-Capillary: 54 mg/dL — ABNORMAL LOW (ref 70–99)
Glucose-Capillary: 61 mg/dL — ABNORMAL LOW (ref 70–99)
Glucose-Capillary: 87 mg/dL (ref 70–99)

## 2018-05-13 LAB — MRSA PCR SCREENING: MRSA by PCR: NEGATIVE

## 2018-05-13 SURGERY — FOREIGN BODY REMOVAL ADULT
Anesthesia: Monitor Anesthesia Care | Site: Foot | Laterality: Left

## 2018-05-13 MED ORDER — LIDOCAINE HCL (CARDIAC) PF 100 MG/5ML IV SOSY
PREFILLED_SYRINGE | INTRAVENOUS | Status: DC | PRN
  Administered 2018-05-13: 50 mg via INTRAVENOUS

## 2018-05-13 MED ORDER — INSULIN DETEMIR 100 UNIT/ML ~~LOC~~ SOLN
20.0000 [IU] | Freq: Two times a day (BID) | SUBCUTANEOUS | Status: DC
  Administered 2018-05-13 – 2018-05-14 (×2): 20 [IU] via SUBCUTANEOUS
  Filled 2018-05-13 (×4): qty 0.2

## 2018-05-13 MED ORDER — CHLORHEXIDINE GLUCONATE 4 % EX LIQD: 60.0000 mL | Freq: Once | CUTANEOUS | Status: DC

## 2018-05-13 MED ORDER — DEXAMETHASONE SODIUM PHOSPHATE 10 MG/ML IJ SOLN
INTRAMUSCULAR | Status: AC
  Filled 2018-05-13: qty 1

## 2018-05-13 MED ORDER — POVIDONE-IODINE 10 % EX SWAB: 2.0000 "application " | Freq: Once | CUTANEOUS | Status: DC

## 2018-05-13 MED ORDER — TRAMADOL HCL 50 MG PO TABS
50.0000 mg | ORAL_TABLET | Freq: Four times a day (QID) | ORAL | Status: DC
  Administered 2018-05-14 (×3): 50 mg via ORAL
  Filled 2018-05-13 (×3): qty 1

## 2018-05-13 MED ORDER — DEXTROSE 50 % IV SOLN
25.0000 mL | Freq: Once | INTRAVENOUS | Status: AC
  Administered 2018-05-13: 25 mL via INTRAVENOUS

## 2018-05-13 MED ORDER — PHENYLEPHRINE HCL 10 MG/ML IJ SOLN
INTRAMUSCULAR | Status: DC | PRN
  Administered 2018-05-13: 80 ug via INTRAVENOUS
  Administered 2018-05-13: 40 ug via INTRAVENOUS

## 2018-05-13 MED ORDER — ASPIRIN EC 81 MG PO TBEC
81.0000 mg | DELAYED_RELEASE_TABLET | Freq: Two times a day (BID) | ORAL | Status: DC
  Administered 2018-05-14: 81 mg via ORAL
  Filled 2018-05-13: qty 1

## 2018-05-13 MED ORDER — PROPOFOL 500 MG/50ML IV EMUL
INTRAVENOUS | Status: DC | PRN
  Administered 2018-05-13: 75 ug/kg/min via INTRAVENOUS

## 2018-05-13 MED ORDER — OXYCODONE HCL 5 MG PO TABS: 5.0000 mg | ORAL_TABLET | Freq: Once | ORAL | Status: DC | PRN

## 2018-05-13 MED ORDER — LIDOCAINE 2% (20 MG/ML) 5 ML SYRINGE
INTRAMUSCULAR | Status: AC
  Filled 2018-05-13: qty 5

## 2018-05-13 MED ORDER — OXYCODONE HCL 5 MG/5ML PO SOLN: 5.0000 mg | Freq: Once | ORAL | Status: DC | PRN

## 2018-05-13 MED ORDER — HYDROCODONE-ACETAMINOPHEN 5-325 MG PO TABS: 1.0000 | ORAL_TABLET | ORAL | Status: DC | PRN

## 2018-05-13 MED ORDER — ONDANSETRON HCL 4 MG PO TABS
4.0000 mg | ORAL_TABLET | Freq: Four times a day (QID) | ORAL | Status: DC | PRN
  Administered 2018-05-14: 4 mg via ORAL
  Filled 2018-05-13: qty 1

## 2018-05-13 MED ORDER — DEXTROSE 50 % IV SOLN
INTRAVENOUS | Status: AC
  Administered 2018-05-13: 25 mL via INTRAVENOUS
  Filled 2018-05-13: qty 50

## 2018-05-13 MED ORDER — DOCUSATE SODIUM 100 MG PO CAPS
100.0000 mg | ORAL_CAPSULE | Freq: Two times a day (BID) | ORAL | Status: DC
  Administered 2018-05-14: 100 mg via ORAL
  Filled 2018-05-13 (×2): qty 1

## 2018-05-13 MED ORDER — SODIUM CHLORIDE 0.9 % IV SOLN
INTRAVENOUS | Status: DC
  Administered 2018-05-13: 09:00:00 via INTRAVENOUS

## 2018-05-13 MED ORDER — METOCLOPRAMIDE HCL 5 MG/ML IJ SOLN: 5.0000 mg | Freq: Three times a day (TID) | INTRAMUSCULAR | Status: DC | PRN

## 2018-05-13 MED ORDER — ONDANSETRON HCL 4 MG/2ML IJ SOLN: 4.0000 mg | Freq: Four times a day (QID) | INTRAMUSCULAR | Status: DC | PRN

## 2018-05-13 MED ORDER — PROPOFOL 10 MG/ML IV BOLUS
INTRAVENOUS | Status: AC
  Filled 2018-05-13: qty 20

## 2018-05-13 MED ORDER — ONDANSETRON HCL 4 MG/2ML IJ SOLN: 4.0000 mg | Freq: Once | INTRAMUSCULAR | Status: DC | PRN

## 2018-05-13 MED ORDER — METOCLOPRAMIDE HCL 5 MG PO TABS
5.0000 mg | ORAL_TABLET | Freq: Three times a day (TID) | ORAL | Status: DC | PRN
Start: 2018-05-13 — End: 2018-05-14
  Administered 2018-05-14: 10 mg via ORAL
  Filled 2018-05-13: qty 2

## 2018-05-13 MED ORDER — MIDAZOLAM HCL 2 MG/2ML IJ SOLN
INTRAMUSCULAR | Status: AC
  Filled 2018-05-13: qty 2

## 2018-05-13 MED ORDER — PHENYLEPHRINE 40 MCG/ML (10ML) SYRINGE FOR IV PUSH (FOR BLOOD PRESSURE SUPPORT)
PREFILLED_SYRINGE | INTRAVENOUS | Status: AC
  Filled 2018-05-13: qty 10

## 2018-05-13 MED ORDER — FENTANYL CITRATE (PF) 100 MCG/2ML IJ SOLN
INTRAMUSCULAR | Status: AC
  Filled 2018-05-13: qty 2

## 2018-05-13 MED ORDER — MORPHINE SULFATE (PF) 2 MG/ML IV SOLN: 0.5000 mg | INTRAVENOUS | Status: DC | PRN

## 2018-05-13 MED ORDER — ONDANSETRON HCL 4 MG/2ML IJ SOLN
INTRAMUSCULAR | Status: AC
  Filled 2018-05-13: qty 2

## 2018-05-13 MED ORDER — FENTANYL CITRATE (PF) 100 MCG/2ML IJ SOLN: 25.0000 ug | INTRAMUSCULAR | Status: DC | PRN

## 2018-05-13 MED ORDER — ACETAMINOPHEN 325 MG PO TABS: 325.0000 mg | ORAL_TABLET | Freq: Four times a day (QID) | ORAL | Status: DC | PRN

## 2018-05-13 MED ORDER — FENTANYL CITRATE (PF) 250 MCG/5ML IJ SOLN
INTRAMUSCULAR | Status: AC
  Filled 2018-05-13: qty 5

## 2018-05-13 MED ORDER — LACTATED RINGERS IV SOLN
INTRAVENOUS | Status: DC
  Administered 2018-05-13: 16:00:00 via INTRAVENOUS

## 2018-05-13 MED ORDER — 0.9 % SODIUM CHLORIDE (POUR BTL) OPTIME
TOPICAL | Status: DC | PRN
  Administered 2018-05-13: 1000 mL

## 2018-05-13 SURGICAL SUPPLY — 55 items
BANDAGE ACE 4X5 VEL STRL LF (GAUZE/BANDAGES/DRESSINGS) IMPLANT
BENZOIN TINCTURE PRP APPL 2/3 (GAUZE/BANDAGES/DRESSINGS) IMPLANT
BLADE CLIPPER SURG (BLADE) IMPLANT
BNDG COHESIVE 1X5 TAN STRL LF (GAUZE/BANDAGES/DRESSINGS) IMPLANT
BNDG COHESIVE 4X5 TAN STRL (GAUZE/BANDAGES/DRESSINGS) ×2 IMPLANT
BNDG CONFORM 3 STRL LF (GAUZE/BANDAGES/DRESSINGS) IMPLANT
BNDG ELASTIC 2X5.8 VLCR STR LF (GAUZE/BANDAGES/DRESSINGS) IMPLANT
BNDG ESMARK 4X9 LF (GAUZE/BANDAGES/DRESSINGS) ×2 IMPLANT
BNDG GAUZE ELAST 4 BULKY (GAUZE/BANDAGES/DRESSINGS) ×2 IMPLANT
CORDS BIPOLAR (ELECTRODE) IMPLANT
COVER SURGICAL LIGHT HANDLE (MISCELLANEOUS) ×2 IMPLANT
CUFF TOURNIQUET SINGLE 18IN (TOURNIQUET CUFF) IMPLANT
CUFF TOURNIQUET SINGLE 24IN (TOURNIQUET CUFF) IMPLANT
DRAPE INCISE IOBAN 66X45 STRL (DRAPES) ×2 IMPLANT
DRAPE OEC MINIVIEW 54X84 (DRAPES) ×2 IMPLANT
DRAPE SURG 17X23 STRL (DRAPES) ×2 IMPLANT
DRAPE U-SHAPE 47X51 STRL (DRAPES) IMPLANT
DRSG EMULSION OIL 3X3 NADH (GAUZE/BANDAGES/DRESSINGS) IMPLANT
DRSG PAD ABDOMINAL 8X10 ST (GAUZE/BANDAGES/DRESSINGS) ×2 IMPLANT
DURAPREP 26ML APPLICATOR (WOUND CARE) IMPLANT
ELECT REM PT RETURN 9FT ADLT (ELECTROSURGICAL) ×2
ELECTRODE REM PT RTRN 9FT ADLT (ELECTROSURGICAL) ×1 IMPLANT
GAUZE PACKING IODOFORM 1/4X15 (GAUZE/BANDAGES/DRESSINGS) ×2 IMPLANT
GAUZE SPONGE 2X2 8PLY STRL LF (GAUZE/BANDAGES/DRESSINGS) IMPLANT
GAUZE SPONGE 4X4 12PLY STRL (GAUZE/BANDAGES/DRESSINGS) ×2 IMPLANT
GAUZE XEROFORM 1X8 LF (GAUZE/BANDAGES/DRESSINGS) IMPLANT
GLOVE BIOGEL PI IND STRL 8 (GLOVE) ×1 IMPLANT
GLOVE BIOGEL PI INDICATOR 8 (GLOVE) ×1
GLOVE SURG ORTHO 8.0 STRL STRW (GLOVE) ×2 IMPLANT
GOWN STRL REUS W/ TWL LRG LVL3 (GOWN DISPOSABLE) ×2 IMPLANT
GOWN STRL REUS W/TWL LRG LVL3 (GOWN DISPOSABLE) ×2
KIT BASIN OR (CUSTOM PROCEDURE TRAY) ×2 IMPLANT
KIT TURNOVER KIT B (KITS) ×2 IMPLANT
MANIFOLD NEPTUNE II (INSTRUMENTS) ×2 IMPLANT
NS IRRIG 1000ML POUR BTL (IV SOLUTION) ×2 IMPLANT
PACK ORTHO EXTREMITY (CUSTOM PROCEDURE TRAY) ×2 IMPLANT
PAD ARMBOARD 7.5X6 YLW CONV (MISCELLANEOUS) ×4 IMPLANT
PENCIL BUTTON HOLSTER BLD 10FT (ELECTRODE) IMPLANT
SPECIMEN JAR SMALL (MISCELLANEOUS) IMPLANT
SPONGE GAUZE 2X2 STER 10/PKG (GAUZE/BANDAGES/DRESSINGS)
STRIP CLOSURE SKIN 1/2X4 (GAUZE/BANDAGES/DRESSINGS) IMPLANT
SUCTION FRAZIER HANDLE 10FR (MISCELLANEOUS)
SUCTION TUBE FRAZIER 10FR DISP (MISCELLANEOUS) IMPLANT
SUT ETHIBOND 4 0 TF (SUTURE) IMPLANT
SUT ETHIBOND 5 0 P 3 (SUTURE)
SUT ETHILON 4 0 P 3 18 (SUTURE) IMPLANT
SUT ETHILON 5 0 P 3 18 (SUTURE)
SUT NYLON ETHILON 5-0 P-3 1X18 (SUTURE) IMPLANT
SUT POLY ETHIBOND 5-0 P-3 1X18 (SUTURE) IMPLANT
SUT PROLENE 4 0 P 3 18 (SUTURE) IMPLANT
SUT SILK 4 0 PS 2 (SUTURE) IMPLANT
SUT VIC AB 3-0 FS2 27 (SUTURE) IMPLANT
TOWEL OR 17X24 6PK STRL BLUE (TOWEL DISPOSABLE) IMPLANT
TOWEL OR 17X26 10 PK STRL BLUE (TOWEL DISPOSABLE) ×2 IMPLANT
TUBE CONNECTING 12X1/4 (SUCTIONS) ×2 IMPLANT

## 2018-05-13 NOTE — Anesthesia Preprocedure Evaluation (Signed)
Anesthesia Evaluation  Patient identified by MRN, date of birth, ID band Patient awake    Reviewed: Allergy & Precautions, NPO status , Patient's Chart, lab work & pertinent test results  Airway Mallampati: II  TM Distance: >3 FB Neck ROM: Full    Dental  (+) Edentulous Upper, Edentulous Lower   Pulmonary former smoker,    breath sounds clear to auscultation       Cardiovascular hypertension,  Rhythm:Regular Rate:Normal     Neuro/Psych    GI/Hepatic   Endo/Other  diabetes  Renal/GU      Musculoskeletal   Abdominal   Peds  Hematology   Anesthesia Other Findings   Reproductive/Obstetrics                             Anesthesia Physical Anesthesia Plan  ASA: III  Anesthesia Plan: MAC and Regional   Post-op Pain Management:    Induction: Intravenous  PONV Risk Score and Plan:   Airway Management Planned: Natural Airway and Simple Face Mask  Additional Equipment:   Intra-op Plan:   Post-operative Plan:   Informed Consent: I have reviewed the patients History and Physical, chart, labs and discussed the procedure including the risks, benefits and alternatives for the proposed anesthesia with the patient or authorized representative who has indicated his/her understanding and acceptance.     Plan Discussed with: CRNA and Anesthesiologist  Anesthesia Plan Comments:         Anesthesia Quick Evaluation

## 2018-05-13 NOTE — Anesthesia Postprocedure Evaluation (Signed)
Anesthesia Post Note  Patient: Melissa Howe  Procedure(s) Performed: FOREIGN BODY REMOVAL left foot (Left Foot)     Patient location during evaluation: PACU Anesthesia Type: Regional Level of consciousness: awake and alert Pain management: pain level controlled Vital Signs Assessment: post-procedure vital signs reviewed and stable Respiratory status: spontaneous breathing, nonlabored ventilation, respiratory function stable and patient connected to nasal cannula oxygen Cardiovascular status: stable and blood pressure returned to baseline Postop Assessment: no apparent nausea or vomiting Anesthetic complications: no    Last Vitals:  Vitals:   05/13/18 1849 05/13/18 1922  BP: (!) 142/69 140/68  Pulse: 70 70  Resp:    Temp: 36.6 C 36.7 C  SpO2: 100% 100%    Last Pain:  Vitals:   05/13/18 1922  TempSrc: Oral  PainSc:                  Mitzi Lilja COKER

## 2018-05-13 NOTE — Op Note (Signed)
NAME: Melissa Howe, WOMAC MEDICAL RECORD [id number] ACCOUNT [id number] DATE OF BIRTH:11-Mar-1951 FACILITY: [facility] LOCATION: [facility] PHYSICIAN:Ellison Rieth Randel Pigg, MD  OPERATIVE REPORT  DATE OF PROCEDURE:  05/13/2018  PREOPERATIVE DIAGNOSIS:  Left foot infection with retained foreign body.  POSTOPERATIVE DIAGNOSIS:  Left foot infection with retained foreign body.  PROCEDURE:  Removal of foreign body with fluoroscopic assistance and excisional debridement of infection between the first and second metatarsal head.  SURGEON:  Alphonzo Severance, MD.  ASSISTANT:  Dyke Brackett, RNFA.  INDICATIONS:  The patient is a 66 year old patient with left foot infection.  She stepped on a pin at a time before this visit, which is unknown.  She has developed infection in the first web space between the metatarsal heads and has retained foreign  body.  Presents now for operative management after explanation of risks and benefits.  PROCEDURE IN DETAIL:  The patient was brought to the operating room where regional anesthetic was induced.  MAC anesthetic was also utilized.  A timeout was called.  Left foot prepped with Hibiclens and draped in sterile manner.  Under fluoroscopic  guidance, the needle was localized between the first and second metatarsal heads closer to the dorsal surface.  There was obvious fluctuance present between the first and second metatarsal head.  Incision was made and the pin was visualized in the deep  tissues.  It was removed.  Cultures were obtained.  Excisional debridement was performed and the wound was packed.    She will need return visit to the OR likely next Wednesday with Dr. Sharol Given.    The patient tolerated the procedure well without immediate complications, transferred to the recovery room in stable condition.  AN/NUANCE  D:05/13/2018 T:05/13/2018 JOB:[id number]

## 2018-05-13 NOTE — Progress Notes (Signed)
PROGRESS NOTE    Patient: Melissa Howe     PCP: System, Pcp Not In                    DOB: [birth year]            DOA: 05/12/2018 JSH:[id number]             DOS: 05/13/2018, 12:24 PM   LOS: 1 day   Date of Service: The patient was seen and examined on 05/13/2018  Subjective:  Patient was seen and examined this morning, stable no acute distress, n.p.o. in anticipation of procedure today.   Due to her diabetes we will check her blood sugar every 4 hours, while holding her long-acting insulin.   She is currently stable  ----------------------------------------------------------------------------------------------------------------------  Brief Narrative:   Rozalyn Osland is a 67 y.o. female with history of hypertension, diabetes mellitus type 2, hyperlipidemia presents with complaints of worsening pain and swelling of the left great toe.  Patient states she walked on a needle last week and since then her left great toe has been worsening pain and swelling.  Has benign subjective feeling of fever chills.  In the ER patient had incision and drainage done by the ER physician following which pain improved.  X-rays show foreign body and Dr. Marlou Sa was consulted and Dr. Marlou Sa recommended admission and transferred to [facility].  Patient was started on empiric antibiotics.  Blood sugar in the 400 and blood pressure was also elevated.  Patient states she has not taken any of her home medication today.  NovoLog 6 units was given along with Levemir reduced dose since patient is n.p.o   -------------------------------------------------------------------------------------------------------------------------- Principal Problem:   Cellulitis and abscess of toe of left foot Active Problems:   Cellulitis   Uncontrolled type 2 diabetes mellitus with hyperglycemia (HCC)   Hypertensive urgency   Assessment & Plan:   Cellulitis and abscess of the left great toe with foreign body  -incision and  drainage was done in the ER. -On empiric antibiotics, will follow with cultures -N.p.o. in anticipation of further I&D 2 OR with Dr. Marlou Sa orthopedic surgery on 7/18    Diabetes mellitus type 2 uncontrolled with hyperglycemia -Currently n.p.o., checking her blood sugar every 4 hours, with SSI - was on Levemir 20 twice daily, we have held this morning's dose due to hypoglycemia.  Home dose is 30 units -Currently mild hypoglycemic and amp of D50 was given anticipating persistent hypoglycemia patient will be started on IVF D5 half-normal  Hypertension uncontrolled - likely secondary to patient not taking medications and also pain related to the foot infection.  On PRN IV hydralazine.   Kidney disease stage III  - creatinine appears to be baseline.  Follow metabolic panel.  Anemia appears to be chronic likely secondary to renal disease.   H&H stable  Follow CBC.   Hyperlipidemia on statins.   DVT prophylaxis: SCDs. Code Status: Full code. Family Communication: Discussed with patient. Disposition Plan: Home. Consults called: Dr. Marlou Sa.  Orthopedics. Admission status: Inpatient.    Antimicrobials:  Anti-infectives (From admission, onward)   Start     Dose/Rate Route Frequency Ordered Stop   05/14/18 0800  vancomycin (VANCOCIN) IVPB 1000 mg/200 mL premix     1,000 mg 200 mL/hr over 60 Minutes Intravenous Every 48 hours 05/12/18 2210     05/12/18 2200  piperacillin-tazobactam (ZOSYN) IVPB 3.375 g     3.375 g 12.5 mL/hr over 240 Minutes Intravenous Every 8 hours  05/12/18 2210     05/12/18 2130  piperacillin-tazobactam (ZOSYN) IVPB 3.375 g  Status:  Discontinued     3.375 g 100 mL/hr over 30 Minutes Intravenous  Once 05/12/18 2121 05/12/18 2156   05/12/18 1900  vancomycin (VANCOCIN) IVPB 1000 mg/200 mL premix     1,000 mg 200 mL/hr over 60 Minutes Intravenous  Once 05/12/18 1857 05/12/18 2213   05/12/18 1845  piperacillin-tazobactam (ZOSYN) IVPB 3.375 g     3.375 g 100 mL/hr  over 30 Minutes Intravenous  Once 05/12/18 1840 05/12/18 2050        Objective: Vitals:   05/12/18 2328 05/13/18 0328 05/13/18 0346 05/13/18 1057  BP: (!) 147/63 126/70  120/67  Pulse: 89 74 74 64  Resp:   18   Temp: 99.5 F (37.5 C) 98.7 F (37.1 C)  98.4 F (36.9 C)  TempSrc: Oral Oral Oral Oral  SpO2: 100% 100%  100%  Weight:   77.1 kg (170 lb)   Height:   5' 4.02" (1.626 m)     Intake/Output Summary (Last 24 hours) at 05/13/2018 1224 Last data filed at 05/13/2018 1015 Gross per 24 hour  Intake 0 ml  Output 300 ml  Net -300 ml   Filed Weights   05/13/18 0346  Weight: 77.1 kg (170 lb)    Examination:  General exam: Appears calm and comfortable  Psychiatry: Judgement and insight appear normal. Mood & affect appropriate. HEENT: WNLs Respiratory system: Clear to auscultation. Respiratory effort normal. Cardiovascular system: S1 & S2 heard, RRR. No JVD, murmurs, rubs, gallops or clicks. No pedal edema. Gastrointestinal system: Abd. nondistended, soft and nontender. No organomegaly or masses felt. Normal bowel sounds heard. Central nervous system: Alert and oriented. No focal neurological deficits. Extremities: Symmetric 5 x 5 power. Skin: Left great toe erythema edema, left interphalangeal between first and second toe open wound with package in place, no extensive drainage. Otherwise skin is warm to touch, negative for any other open wounds or rashes  Data Reviewed: I have personally reviewed following labs and imaging studies  CBC: Recent Labs  Lab 05/12/18 1731  WBC 16.5*  NEUTROABS 12.8*  HGB 10.9*  HCT 32.4*  MCV 83.7  PLT 559*   Basic Metabolic Panel: Recent Labs  Lab 05/12/18 1731  NA 131*  K 4.5  CL 95*  CO2 26  GLUCOSE 410*  BUN 38*  CREATININE 2.09*  CALCIUM 9.2   GFR: Estimated Creatinine Clearance: 25.9 mL/min (A) (by C-G formula based on SCr of 2.09 mg/dL (H)). Liver Function Tests: Recent Labs  Lab 05/12/18 1731  AST 11*  ALT 11    ALKPHOS 91  BILITOT 0.6  PROT 7.7  ALBUMIN 3.1*  CBG: Recent Labs  Lab 05/12/18 2047 05/13/18 0012 05/13/18 0414 05/13/18 0754  GLUCAP 398* 343* 194* 112*   LiSepsis Labs: Recent Labs  Lab 05/12/18 1942  LATICACIDVEN 1.00    Recent Results (from the past 240 hour(s))  Wound or Superficial Culture     Status: None (Preliminary result)   Collection Time: 05/12/18  7:12 PM  Result Value Ref Range Status   Specimen Description   Final    WOUND LEFT FOOT Performed at [facility], [place] [address] [place], [place] [address]    Special Requests NONE  Final   Gram Stain   Final    ABUNDANT WBC PRESENT, PREDOMINANTLY PMN MODERATE GRAM POSITIVE COCCI IN CLUSTERS Performed at [facility], [place] [address], [place], [place]  [address]    Culture PENDING  Incomplete   Report Status PENDING  Incomplete  MRSA PCR Screening     Status: None   Collection Time: 05/13/18  4:05 AM  Result Value Ref Range Status   MRSA by PCR NEGATIVE NEGATIVE Final    Comment:        The GeneXpert MRSA Assay (FDA approved for NASAL specimens only), is one component of a comprehensive MRSA colonization surveillance program. It is not intended to diagnose MRSA infection nor to guide or monitor treatment for MRSA infections. Performed at [facility], [place] [address] [place], [place] [address]       Radiology Studies: Dg Ankle Complete Left  Result Date: 05/12/2018 CLINICAL DATA:  67 year old female with a history of left foot and ankle pain EXAM: LEFT ANKLE COMPLETE - 3+ VIEW COMPARISON:  None. FINDINGS: No acute displaced fracture. Degenerative changes of the hindfoot including the tibiotalar joint. Degenerative changes of the midfoot. Enthesopathic changes at the Achilles insertion and plantar fascia insertion. Ankle mortise congruent. Medial calcinosis of the tibial and pedal vessels. No joint effusion. IMPRESSION: Negative for acute bony abnormality. Degenerative  changes of the midfoot and hindfoot. Atherosclerosis of the tibial pedal vessels. Electronically Signed   By: Corrie Mckusick D.O.   On: 05/12/2018 16:02   Dg Foot Complete Left  Result Date: 05/12/2018 CLINICAL DATA:  Pain, diabetes mellitus, hypertension EXAM: LEFT FOOT - COMPLETE 3+ VIEW COMPARISON:  None FINDINGS: Linear metallic foreign body 19 mm long lateral to the proximal phalanx of the great toe. Bones appear demineralized. Mild degenerative changes first MTP joint. No acute fracture, dislocation, or bone destruction. Plantar calcaneal spur. Mild scattered soft tissue swelling and small vessel vascular calcifications. IMPRESSION: Metallic foreign body question needle fragment lateral to the proximal phalanx of the LEFT great toe. Electronically Signed   By: Lavonia Dana M.D.   On: 05/12/2018 16:06    Scheduled Meds: . amLODipine  5 mg Oral Daily  . carvedilol  6.25 mg Oral BID WC  . insulin aspart  0-9 Units Subcutaneous Q4H  . insulin detemir  20 Units Subcutaneous BID   Continuous Infusions: . sodium chloride 75 mL/hr at 05/13/18 0845  . piperacillin-tazobactam (ZOSYN)  IV 3.375 g (05/13/18 0618)  . [START ON 05/14/2018] vancomycin      Time spent: >25 minutes  Deatra James, MD [facility],  Pager [phone]  If 7PM-7AM, please contact night-coverage [email or web address]   Password [facility]  05/13/2018, 12:24 PM

## 2018-05-13 NOTE — Progress Notes (Addendum)
Inpatient Diabetes Program Recommendations  AACE/ADA: New Consensus Statement on Inpatient Glycemic Control (2015)  Target Ranges:  Prepandial:   less than 140 mg/dL      Peak postprandial:   less than 180 mg/dL (1-2 hours)      Critically ill patients:  140 - 180 mg/dL   Lab Results  Component Value Date   GLUCAP 112 (H) 05/13/2018   HGBA1C >15.5 (H) 09/08/2016    Review of Glycemic Control Results for BECKHAM, BUXBAUM (MRN [id number]) as of 05/13/2018 11:50  Ref. Range 05/12/2018 20:47 05/13/2018 00:12 05/13/2018 04:14 05/13/2018 07:54  Glucose-Capillary Latest Ref Range: 70 - 99 mg/dL 398 (H) 343 (H) 194 (H) 112 (H)   Diabetes history: Type 2 DM Outpatient Diabetes medications: Levemir 30 units BID Current orders for Inpatient glycemic control: Levemir 20 units BID, Novolog 0-9 units TID  Inpatient Diabetes Program Recommendations:    Last A1C >15.5% in 2017, consider repeating A1C?  Blood sugars trending well since patient received insulin. Agree with current orders.  Thanks, Bronson Curb, MSN, RNC-OB Diabetes Coordinator [phone] (8a-5p)

## 2018-05-13 NOTE — Consult Note (Signed)
Reason for Consult: Referring Physician: Dr Melissa Howe is an 67 y.o. female.  HPI: Melissa Howe is a patient with diabetes who stepped on a pin sometime earlier possibly this week.  She is not exactly sure when it happened because she has neuropathy in her feet.  She developed swelling in the first MTP interspace.  She was noted to have a retained foreign metallic object in that area.  She presents now for operative management after explanation of risks and benefits  Past Medical History:  Diagnosis Date  . Diabetes mellitus    a. noncompliant with insulin.  Marland Kitchen Hyperlipemia   . Hypertension     Past Surgical History:  Procedure Laterality Date  . APPENDECTOMY    . TONSILLECTOMY    . TUBAL LIGATION      Family History  Problem Relation Age of Onset  . Hypertension Mother   . Diabetes Mother   . Hypertension Sister     Social History:  reports that she quit smoking about 34 years ago. Her smoking use included cigars. She quit after 4.00 years of use. She has never used smokeless tobacco. She reports that she does not drink alcohol or use drugs. I have reviewed the patient's current medications. Allergies: No Known Allergies  Medications:   Results for orders placed or performed during the hospital encounter of 05/12/18 (from the past 48 hour(s))  Comprehensive metabolic panel     Status: Abnormal   Collection Time: 05/12/18  5:31 PM  Result Value Ref Range   Sodium 131 (L) 135 - 145 mmol/L   Potassium 4.5 3.5 - 5.1 mmol/L   Chloride 95 (L) 98 - 111 mmol/L    Comment: Please note change in reference range.   CO2 26 22 - 32 mmol/L   Glucose, Bld 410 (H) 70 - 99 mg/dL    Comment: Please note change in reference range.   BUN 38 (H) 8 - 23 mg/dL    Comment: Please note change in reference range.   Creatinine, Ser 2.09 (H) 0.44 - 1.00 mg/dL   Calcium 9.2 8.9 - 10.3 mg/dL   Total Protein 7.7 6.5 - 8.1 g/dL   Albumin 3.1 (L) 3.5 - 5.0 g/dL   AST 11 (L) 15 - 41 U/L   ALT 11 0 - 44 U/L    Comment: Please note change in reference range.   Alkaline Phosphatase 91 38 - 126 U/L   Total Bilirubin 0.6 0.3 - 1.2 mg/dL   GFR calc non Af Amer 23 (L) >60 mL/min   GFR calc Af Amer 27 (L) >60 mL/min    Comment: (NOTE) The eGFR has been calculated using the CKD EPI equation. This calculation has not been validated in all clinical situations. eGFR's persistently <60 mL/min signify possible Chronic Kidney Disease.    Anion gap 10 5 - 15    Comment: Performed at [facility], [place] [address] [place], [place] [address]  CBC with Differential     Status: Abnormal   Collection Time: 05/12/18  5:31 PM  Result Value Ref Range   WBC 16.5 (H) 4.0 - 10.5 K/uL   RBC 3.87 3.87 - 5.11 MIL/uL   Hemoglobin 10.9 (L) 12.0 - 15.0 g/dL   HCT 32.4 (L) 36.0 - 46.0 %   MCV 83.7 78.0 - 100.0 fL   MCH 28.2 26.0 - 34.0 pg   MCHC 33.6 30.0 - 36.0 g/dL   RDW 12.7 11.5 - 15.5 %  Platelets 474 (H) 150 - 400 K/uL   Neutrophils Relative % 78 %   Neutro Abs 12.8 (H) 1.7 - 7.7 K/uL   Lymphocytes Relative 15 %   Lymphs Abs 2.5 0.7 - 4.0 K/uL   Monocytes Relative 6 %   Monocytes Absolute 1.0 0.1 - 1.0 K/uL   Eosinophils Relative 1 %   Eosinophils Absolute 0.2 0.0 - 0.7 K/uL   Basophils Relative 0 %   Basophils Absolute 0.0 0.0 - 0.1 K/uL    Comment: Performed at [facility], [place] [address], [place], [place] [address]  Wound or Superficial Culture     Status: None (Preliminary result)   Collection Time: 05/12/18  7:12 PM  Result Value Ref Range   Specimen Description      WOUND LEFT FOOT Performed at [place] [address] [place], [place] [address]    Special Requests NONE    Gram Stain      ABUNDANT WBC PRESENT, PREDOMINANTLY PMN MODERATE GRAM POSITIVE COCCI IN CLUSTERS    Culture      CULTURE REINCUBATED FOR BETTER GROWTH Performed at [facility], [place] [address] [place], [place] [address]    Report  Status PENDING   I-Stat CG4 Lactic Acid, ED     Status: None   Collection Time: 05/12/18  7:42 PM  Result Value Ref Range   Lactic Acid, Venous 1.00 0.5 - 1.9 mmol/L  POC CBG, ED     Status: Abnormal   Collection Time: 05/12/18  8:47 PM  Result Value Ref Range   Glucose-Capillary 398 (H) 70 - 99 mg/dL   Comment 1 Notify RN    Comment 2 Document in Chart   Glucose, capillary     Status: Abnormal   Collection Time: 05/13/18 12:12 AM  Result Value Ref Range   Glucose-Capillary 343 (H) 70 - 99 mg/dL  MRSA PCR Screening     Status: None   Collection Time: 05/13/18  4:05 AM  Result Value Ref Range   MRSA by PCR NEGATIVE NEGATIVE    Comment:        The GeneXpert MRSA Assay (FDA approved for NASAL specimens only), is one component of a comprehensive MRSA colonization surveillance program. It is not intended to diagnose MRSA infection nor to guide or monitor treatment for MRSA infections. Performed at [facility], [place] [address] [place], [place] [address]   Glucose, capillary     Status: Abnormal   Collection Time: 05/13/18  4:14 AM  Result Value Ref Range   Glucose-Capillary 194 (H) 70 - 99 mg/dL  Glucose, capillary     Status: Abnormal   Collection Time: 05/13/18  7:54 AM  Result Value Ref Range   Glucose-Capillary 112 (H) 70 - 99 mg/dL  Glucose, capillary     Status: Abnormal   Collection Time: 05/13/18 11:54 AM  Result Value Ref Range   Glucose-Capillary 54 (L) 70 - 99 mg/dL  Glucose, capillary     Status: Abnormal   Collection Time: 05/13/18 12:24 PM  Result Value Ref Range   Glucose-Capillary 118 (H) 70 - 99 mg/dL    Dg Ankle Complete Left  Result Date: 05/12/2018 CLINICAL DATA:  67 year old female with a history of left foot and ankle pain EXAM: LEFT ANKLE COMPLETE - 3+ VIEW COMPARISON:  None. FINDINGS: No acute displaced fracture. Degenerative changes of the hindfoot including the tibiotalar joint. Degenerative changes of the midfoot. Enthesopathic changes at  the Achilles  insertion and plantar fascia insertion. Ankle mortise congruent. Medial calcinosis of the tibial and pedal vessels. No joint effusion. IMPRESSION: Negative for acute bony abnormality. Degenerative changes of the midfoot and hindfoot. Atherosclerosis of the tibial pedal vessels. Electronically Signed   By: Corrie Mckusick D.O.   On: 05/12/2018 16:02   Dg Foot Complete Left  Result Date: 05/12/2018 CLINICAL DATA:  Pain, diabetes mellitus, hypertension EXAM: LEFT FOOT - COMPLETE 3+ VIEW COMPARISON:  None FINDINGS: Linear metallic foreign body 19 mm long lateral to the proximal phalanx of the great toe. Bones appear demineralized. Mild degenerative changes first MTP joint. No acute fracture, dislocation, or bone destruction. Plantar calcaneal spur. Mild scattered soft tissue swelling and small vessel vascular calcifications. IMPRESSION: Metallic foreign body question needle fragment lateral to the proximal phalanx of the LEFT great toe. Electronically Signed   By: Lavonia Dana M.D.   On: 05/12/2018 16:06    Review of Systems  Musculoskeletal: Positive for joint pain.  All other systems reviewed and are negative.  Blood pressure (!) 153/70, pulse 70, temperature 98.2 F (36.8 C), temperature source Oral, resp. rate 18, height 5' 4.02" (1.626 m), weight 170 lb (77.1 kg), SpO2 100 %. Physical Exam  Constitutional: She appears well-developed.  HENT:  Head: Normocephalic.  Eyes: Pupils are equal, round, and reactive to light.  Neck: Normal range of motion.  Cardiovascular: Normal rate.  Respiratory: Effort normal.  Neurological: She is alert.  Skin: Skin is warm.  Psychiatric: She has a normal mood and affect.  Examination of the left foot demonstrates some swelling in that first metatarsal interspace.  Not much tenderness is present.  There is no fluctuance or draining sinuses.  Pedal pulses palpable but sensation diminished on the dorsal and plantar aspect of the foot.  Ankle dorsiflexion  plantarflexion intact.  Assessment/Plan: Impression is retained foreign body in the left foot.  Plan is I&D and removal of foreign body.  Risk and benefits are discussed.  They include but not limited to infection nerve vessel damage potential that the 2 toes may become dysvascular.  That the great toe and the second toe.  Persistent infection and potential need for more surgery also discussed.  All questions answered.  Melissa Howe 05/13/2018, 4:45 PM

## 2018-05-13 NOTE — Brief Op Note (Signed)
05/12/2018 - 05/13/2018  5:51 PM  PATIENT:  Melissa Howe  67 y.o. female  PRE-OPERATIVE DIAGNOSIS:  left foot foreign body  POST-OPERATIVE DIAGNOSIS:  left foot foreign body  PROCEDURE:  Procedure(s): FOREIGN BODY REMOVAL left foot  SURGEON:  Surgeon(s): Marlou Sa, Tonna Corner, MD  ASSISTANT: Elvis Coil rnfa  ANESTHESIA:   regional  EBL: 5 ml    Total I/O In: 0  Out: 550 [Urine:550]  BLOOD ADMINISTERED: none  DRAINS: iodoform to toes   LOCAL MEDICATIONS USED:  none  SPECIMEN:  Cultures x 1  COUNTS:  YES  TOURNIQUET:  * No tourniquets in log *  DICTATION: .Other Dictation: Dictation Number [id number]  PLAN OF CARE: Admit to inpatient   PATIENT DISPOSITION:  PACU - hemodynamically stable

## 2018-05-13 NOTE — Anesthesia Procedure Notes (Signed)
Anesthesia Regional Block: Ankle block   Pre-Anesthetic Checklist: ,, timeout performed, Correct Patient, Correct Site, Correct Laterality, Correct Procedure, Correct Position, site marked, Risks and benefits discussed,  Surgical consent,  Pre-op evaluation,  At surgeon's request and post-op pain management  Laterality: Left  Prep: chloraprep       Needles:  Injection technique: Single-shot      Needle Gauge: 22     Additional Needles:   Narrative:  Start time: 05/13/2018 4:50 PM End time: 05/13/2018 5:00 PM Injection made incrementally with aspirations every 5 mL.  Performed by: Personally   Additional Notes: 25 cc 2% lidocaine plain injected easily

## 2018-05-13 NOTE — Transfer of Care (Signed)
Immediate Anesthesia Transfer of Care Note  Patient: Melissa Howe  Procedure(s) Performed: FOREIGN BODY REMOVAL left foot (Left Foot)  Patient Location: PACU  Anesthesia Type:MAC combined with regional for post-op pain  Level of Consciousness: awake, alert , oriented and patient cooperative  Airway & Oxygen Therapy: Patient Spontanous Breathing  Post-op Assessment: Report given to RN and Post -op Vital signs reviewed and stable  Post vital signs: Reviewed and stable  Last Vitals:  Vitals Value Taken Time  BP    Temp    Pulse 66 05/13/2018  5:58 PM  Resp 13 05/13/2018  5:58 PM  SpO2 100 % 05/13/2018  5:58 PM  Vitals shown include unvalidated device data.  Last Pain:  Vitals:   05/13/18 1505  TempSrc: Oral  PainSc: 0-No pain         Complications: No apparent anesthesia complications

## 2018-05-13 NOTE — Progress Notes (Addendum)
1520 Report was given to [facility] in short stay, NPO post mn maint. Family not present at this time. Pt to short stay via bed.  1845 received pt from PACU, A&O x4. Left foot dressing dry and intact. Denies pain at this time.

## 2018-05-13 NOTE — Progress Notes (Addendum)
Hypoglycemic Event  CBG: 54  Treatment: D50 IV 25 mL  Symptoms: None  Follow-up CBG: Time:1220 CBG Result:118  Possible Reasons for Event: Inadequate meal intake NPO  Comments/MD notified:Message sent to Dr Roger Shelter.  First CBG result of 54 is not showing in Epic, machine is not uploading.   Makira Holleman Karin Golden

## 2018-05-13 NOTE — Anesthesia Procedure Notes (Signed)
Procedure Name: MAC Date/Time: 05/13/2018 5:25 PM Performed by: Oletta Lamas, CRNA Pre-anesthesia Checklist: Patient identified, Emergency Drugs available, Suction available and Patient being monitored Patient Re-evaluated:Patient Re-evaluated prior to induction Oxygen Delivery Method: Simple face mask Preoxygenation: Pre-oxygenation with 100% oxygen

## 2018-05-14 ENCOUNTER — Encounter (HOSPITAL_COMMUNITY): Payer: Self-pay | Admitting: Orthopedic Surgery

## 2018-05-14 DIAGNOSIS — L03032 Cellulitis of left toe: Principal | ICD-10-CM

## 2018-05-14 DIAGNOSIS — L02612 Cutaneous abscess of left foot: Secondary | ICD-10-CM

## 2018-05-14 LAB — BASIC METABOLIC PANEL
Anion gap: 7 (ref 5–15)
BUN: 28 mg/dL — ABNORMAL HIGH (ref 8–23)
CO2: 25 mmol/L (ref 22–32)
Calcium: 8.3 mg/dL — ABNORMAL LOW (ref 8.9–10.3)
Chloride: 106 mmol/L (ref 98–111)
Creatinine, Ser: 2.2 mg/dL — ABNORMAL HIGH (ref 0.44–1.00)
GFR calc Af Amer: 25 mL/min — ABNORMAL LOW (ref >60)
GFR calc non Af Amer: 22 mL/min — ABNORMAL LOW (ref >60)
Glucose, Bld: 119 mg/dL — ABNORMAL HIGH (ref 70–99)
Potassium: 4.1 mmol/L (ref 3.5–5.1)
Sodium: 138 mmol/L (ref 135–145)

## 2018-05-14 LAB — GLUCOSE, CAPILLARY
Glucose-Capillary: 107 mg/dL — ABNORMAL HIGH (ref 70–99)
Glucose-Capillary: 149 mg/dL — ABNORMAL HIGH (ref 70–99)
Glucose-Capillary: 149 mg/dL — ABNORMAL HIGH (ref 70–99)
Glucose-Capillary: 171 mg/dL — ABNORMAL HIGH (ref 70–99)
Glucose-Capillary: 97 mg/dL (ref 70–99)

## 2018-05-14 LAB — CBC
HCT: 26.5 % — ABNORMAL LOW (ref 36.0–46.0)
Hemoglobin: 8.4 g/dL — ABNORMAL LOW (ref 12.0–15.0)
MCH: 27.3 pg (ref 26.0–34.0)
MCHC: 31.7 g/dL (ref 30.0–36.0)
MCV: 86 fL (ref 78.0–100.0)
Platelets: 352 10*3/uL (ref 150–400)
RBC: 3.08 MIL/uL — ABNORMAL LOW (ref 3.87–5.11)
RDW: 12.4 % (ref 11.5–15.5)
WBC: 10.4 10*3/uL (ref 4.0–10.5)

## 2018-05-14 MED ORDER — AMOXICILLIN-POT CLAVULANATE 875-125 MG PO TABS: 1.0000 | ORAL_TABLET | Freq: Two times a day (BID) | ORAL | 0 refills | Status: AC

## 2018-05-14 MED ORDER — SODIUM CHLORIDE 0.9 % IV SOLN
3.0000 g | Freq: Two times a day (BID) | INTRAVENOUS | Status: DC
  Filled 2018-05-14: qty 3

## 2018-05-14 MED ORDER — HYDROCODONE-ACETAMINOPHEN 5-325 MG PO TABS: 1.0000 | ORAL_TABLET | ORAL | 0 refills | Status: DC | PRN

## 2018-05-14 NOTE — Care Management Important Message (Signed)
Important Message  Patient Details  Name: Melissa Howe MRN: [id number] Date of Birth: 1951-08-02   Medicare Important Message Given:  Yes    Orbie Pyo 05/14/2018, 3:34 PM

## 2018-05-14 NOTE — Discharge Summary (Signed)
Physician Discharge Summary  Kammie Scioli Kinsella TKZ:[id number] DOB: 1951/05/29 DOA: 05/12/2018  PCP: Javier Docker, MD  Admit date: 05/12/2018 Discharge date: 05/14/2018  Admitted From: Home  Disposition:  Home   Recommendations for Outpatient Follow-up:  1. Follow up with PCP in 1 weeks 2. Please obtain BMP in one week to check Cr and restart lisinopril and furosemide if necessary 3. Follow up with Dr. Marlou [facility] on Tuesday as directed      Home Health: Yes  Equipment/Devices: Wheeled walker, 3 in 1  Discharge Condition: Good  CODE STATUS: FULL Diet recommendation: Diabetic  Brief/Interim Summary: Mrs. Smyser is a 67 y.o. F with IDDM, HTN who presents with 1 week worsening pain and swelling of the left great toe. Patient states she walked on a needle last week and since then her left great toe has been worsening pain and swelling. Has benign subjective feeling of fever chills.  In the ER patient had incision and drainage done by the ER physician following which pain improved. X-rays show foreign body and Dr. Marlou Sa was consulted and Dr. Marlou Sa recommended admission and transferred to [facility]. Patient was started on empiric antibiotics.              Discharge Diagnoses:   Cellulitis and abscess of the left great toe with foreign body Incision and drainage was done in the ER on 7/17, after which patient was evaluated by Dr. Marlou Sa, foreign body was still present, and she went to the OR on 7/18 for debridement and removal of foreign body.  Intraoeprative cultures growing GPCs.  No hx MRSA, MRSA nasal swab negative.   -Discharged with Augmentin; minimal surrounding erythema, very doubtful of MRSA but will follow culture data and change to Bactrim if MRSA   Kidney disease stage III  Baseline Cr 1.8, mild trend up last two days.  Lisinopril and furosemide held at discharge.  Push fluids, close follow up with PCP for repeat Cr.    Anemia of chronic renal  disease Stable relative to baseline  Diabetes mellitus type 2 uncontrolled with hyperglycemia No change to regimen.  Hypertension No change to regimen.  Controlled.           Discharge Instructions  Discharge Instructions    Diet Carb Modified   Complete by:  As directed    Discharge instructions   Complete by:  As directed    From Dr. Loleta Books: You were admitted with foot infection.   Dr. Marlou Sa cleaned this out in the OR, and removed the foreign piece of metal.   This infection should clear up nicely now. Take Augmentin 875-125 mg twice daily for the next 7 days See Dr. Marlou Sa at your previously scheduled appointment.   Call your primary doctor this afternoon to schedule a follow up appointment next week Have your kidney function checked within 1 week.   For your blood pressure: Resume your home medicines FOR NOW: hold your lisinopril and furosemide Talk to your doctor this week about restarting them after they check your kidney function   Increase activity slowly   Complete by:  As directed      Allergies as of 05/14/2018   No Known Allergies     Medication List    STOP taking these medications   benzonatate 100 MG capsule Commonly known as:  TESSALON   cefdinir 300 MG capsule Commonly known as:  OMNICEF   furosemide 20 MG tablet Commonly known as:  LASIX   guaiFENesin-dextromethorphan 100-10 MG/5ML  syrup Commonly known as:  ROBITUSSIN DM   ibuprofen 200 MG tablet Commonly known as:  ADVIL,MOTRIN   lisinopril 10 MG tablet Commonly known as:  PRINIVIL,ZESTRIL   lisinopril 5 MG tablet Commonly known as:  PRINIVIL   lisinopril-hydrochlorothiazide 20-25 MG tablet Commonly known as:  PRINZIDE,ZESTORETIC   triamcinolone 0.025 % ointment Commonly known as:  KENALOG     TAKE these medications   amLODipine 5 MG tablet Commonly known as:  NORVASC Take 1 tablet (5 mg total) by mouth daily.   amoxicillin-clavulanate 875-125 MG tablet Commonly  known as:  AUGMENTIN Take 1 tablet by mouth 2 (two) times daily for 7 days.   aspirin EC 81 MG tablet Take 1 tablet (81 mg total) by mouth daily.   carvedilol 6.25 MG tablet Commonly known as:  COREG Take 1 tablet (6.25 mg total) by mouth 2 (two) times daily with a meal.   HYDROcodone-acetaminophen 5-325 MG tablet Commonly known as:  NORCO/VICODIN Take 1-2 tablets by mouth every 4 (four) hours as needed for moderate pain (pain score 4-6).   Insulin Detemir 100 UNIT/ML Pen Commonly known as:  LEVEMIR Inject 30 Units into the skin daily with breakfast.   Insulin Pen Needle 32G X 4 MM Misc Use with insulin pen to dispense insulin as directed   rosuvastatin 20 MG tablet Commonly known as:  CRESTOR Take 1 tablet (20 mg total) by mouth daily.            Durable Medical Equipment  (From admission, onward)        Start     Ordered   05/14/18 1422  For home use only DME Walker rolling  [facility])  Once    Question:  Patient needs a walker to treat with the following condition  Answer:  Diabetic foot infection ([place])   05/14/18 1421   05/14/18 1421  DME 3-in-1  Once     05/14/18 1421   05/14/18 1251  For home use only DME Bedside commode  Once    Question:  Patient needs a bedside commode to treat with the following condition  Answer:  Status post left foot surgery   05/14/18 1252   05/14/18 1250  For home use only DME Walker rolling  Once    Question:  Patient needs a walker to treat with the following condition  Answer:  Status post left foot surgery   05/14/18 1252   05/14/18 1153  For home use only DME 3 n 1  Once     05/14/18 1153   05/14/18 1153  For home use only DME Walker rolling  Once    Question:  Patient needs a walker to treat with the following condition  Answer:  Abscess of foot   05/14/18 1153     Follow-up Information    Newt Minion, MD Follow up.   Specialty:  Orthopedic Surgery Contact information: [place]  [address] [phone]        [place], [place] Follow up.   Specialty:  [place] Why:  A representative from [facility] will contact you to arrange start date and time for your therapy. Contact information: [place] [address] [phone]          No Known Allergies  Consultations:  Orthopedics   Procedures/Studies: Dg Ankle Complete Left  Result Date: 05/12/2018 CLINICAL DATA:  67 year old female with a history of left foot and ankle pain EXAM: LEFT  ANKLE COMPLETE - 3+ VIEW COMPARISON:  None. FINDINGS: No acute displaced fracture. Degenerative changes of the hindfoot including the tibiotalar joint. Degenerative changes of the midfoot. Enthesopathic changes at the Achilles insertion and plantar fascia insertion. Ankle mortise congruent. Medial calcinosis of the tibial and pedal vessels. No joint effusion. IMPRESSION: Negative for acute bony abnormality. Degenerative changes of the midfoot and hindfoot. Atherosclerosis of the tibial pedal vessels. Electronically Signed   By: Corrie Mckusick D.O.   On: 05/12/2018 16:02   US Venous Img Lower Unilateral Left  Result Date: 05/04/2018 CLINICAL DATA:  Left foot swelling EXAM: LEFT LOWER EXTREMITY VENOUS DOPPLER ULTRASOUND TECHNIQUE: Gray-scale sonography with graded compression, as well as color Doppler and duplex ultrasound were performed to evaluate the lower extremity deep venous systems from the level of the common femoral vein and including the common femoral, femoral, profunda femoral, popliteal and calf veins including the posterior tibial, peroneal and gastrocnemius veins when visible. The superficial great saphenous vein was also interrogated. Spectral Doppler was utilized to evaluate flow at rest and with distal augmentation maneuvers in the common femoral, femoral and popliteal veins. COMPARISON:  None. FINDINGS: Contralateral Common Femoral Vein: Respiratory phasicity is  normal and symmetric with the symptomatic side. No evidence of thrombus. Normal compressibility. Common Femoral Vein: No evidence of thrombus. Normal compressibility, respiratory phasicity and response to augmentation. Saphenofemoral Junction: No evidence of thrombus. Normal compressibility and flow on color Doppler imaging. Profunda Femoral Vein: No evidence of thrombus. Normal compressibility and flow on color Doppler imaging. Femoral Vein: No evidence of thrombus. Normal compressibility, respiratory phasicity and response to augmentation. Popliteal Vein: No evidence of thrombus. Normal compressibility, respiratory phasicity and response to augmentation. Calf Veins: No evidence of thrombus. Normal compressibility and flow on color Doppler imaging. Superficial Great Saphenous Vein: No evidence of thrombus. Normal compressibility. Venous Reflux:  None. Other Findings:  None. IMPRESSION: No evidence of deep venous thrombosis in the left lower extremity. Electronically Signed   By: Ilona Sorrel M.D.   On: 05/04/2018 19:42   Dg Foot Complete Left  Result Date: 05/12/2018 CLINICAL DATA:  Pain, diabetes mellitus, hypertension EXAM: LEFT FOOT - COMPLETE 3+ VIEW COMPARISON:  None FINDINGS: Linear metallic foreign body 19 mm long lateral to the proximal phalanx of the great toe. Bones appear demineralized. Mild degenerative changes first MTP joint. No acute fracture, dislocation, or bone destruction. Plantar calcaneal spur. Mild scattered soft tissue swelling and small vessel vascular calcifications. IMPRESSION: Metallic foreign body question needle fragment lateral to the proximal phalanx of the LEFT great toe. Electronically Signed   By: [place] Dana M.D.   On: 05/12/2018 16:06       Subjective: Feels well. Foot numb at baseline, but no pain, swelling, fever, chills. No malaise, nausea, vomtiign.  Discharge Exam: Vitals:   05/14/18 0441 05/14/18 1150  BP: 114/61 137/68  Pulse: 68 67  Resp:  18  Temp:  98.4 F (36.9 C) 98 F (36.7 C)  SpO2: 98% 100%   Vitals:   05/13/18 1922 05/14/18 0022 05/14/18 0441 05/14/18 1150  BP: 140/68 134/89 114/61 137/68  Pulse: 70 73 68 67  Resp:    18  Temp: 98.1 F (36.7 C) 98.6 F (37 C) 98.4 F (36.9 C) 98 F (36.7 C)  TempSrc: Oral Oral Oral Oral  SpO2: 100% 97% 98% 100%  Weight:      Height:        General: Pt is alert, awake, not in acute distress Cardiovascular: RRR, S1/S2 +,  no rubs, no gallops Respiratory: CTA bilaterally, no wheezing, no rhonchi Abdominal: Soft, NT, ND, bowel sounds + Extremities: no edema, no cyanosis, the left foot is bandaged, but there is no redness or swelling or induration around the great toe when the bandage is taken down    The results of significant diagnostics from this hospitalization (including imaging, microbiology, ancillary and laboratory) are listed below for reference.     Microbiology: Recent Results (from the past 240 hour(s))  Wound or Superficial Culture     Status: None (Preliminary result)   Collection Time: 05/12/18  7:12 PM  Result Value Ref Range Status   Specimen Description   Final    WOUND LEFT FOOT Performed at [facility], [place] [address] [place], [place] [address]    Special Requests NONE  Final   Gram Stain   Final    ABUNDANT WBC PRESENT, PREDOMINANTLY PMN MODERATE GRAM POSITIVE COCCI IN CLUSTERS    Culture   Final    CULTURE REINCUBATED FOR BETTER GROWTH Performed at [facility], [place] [address], [place], [place] [address]    Report Status PENDING  Incomplete  Culture, blood (routine x 2)     Status: None (Preliminary result)   Collection Time: 05/12/18  7:33 PM  Result Value Ref Range Status   Specimen Description   Final    BLOOD RIGHT ANTECUBITAL Performed at [place] [address], [place], [place] [address]    Special Requests   Final    BOTTLES DRAWN AEROBIC AND ANAEROBIC Blood Culture adequate  volume Performed at [place] [address] [place], [place] [address]    Culture   Final    NO GROWTH 2 DAYS Performed at [place] [address] [place], [place] [address]    Report Status PENDING  Incomplete  Culture, blood (routine x 2)     Status: None (Preliminary result)   Collection Time: 05/12/18  7:55 PM  Result Value Ref Range Status   Specimen Description   Final    BLOOD RIGHT HAND Performed at [place] [address], [place], [place] [address]    Special Requests   Final    BOTTLES DRAWN AEROBIC AND ANAEROBIC Blood Culture results may not be optimal due to an excessive volume of blood received in culture bottles Performed at [place] [address], [place], [place] [address]    Culture   Final    NO GROWTH 2 DAYS Performed at [facility] [address], [place], [place] [address]    Report Status PENDING  Incomplete  MRSA PCR Screening     Status: None   Collection Time: 05/13/18  4:05 AM  Result Value Ref Range Status   MRSA by PCR NEGATIVE NEGATIVE Final    Comment:        The GeneXpert MRSA Assay (FDA approved for NASAL specimens only), is one component of a comprehensive MRSA colonization surveillance program. It is not intended to diagnose MRSA infection nor to guide or monitor treatment for MRSA infections. Performed at [facility], [place] [address], [place], [place] [address]   Aerobic/Anaerobic Culture (surgical/deep wound)     Status: None (Preliminary result)   Collection Time: 05/13/18  5:40 PM  Result Value Ref Range Status   Specimen Description ABSCESS  Final   Special Requests   Final    PT ON ZOSYN,VANCOMYCIN SAMPLE  FROM BETWEEN BIG AND SECOND TOES   Gram Stain   Final    MODERATE WBC PRESENT,BOTH PMN AND MONONUCLEAR RARE GRAM POSITIVE COCCI    Culture   Final    CULTURE REINCUBATED FOR BETTER GROWTH Performed at [facility],  [place] [address], [place], [place] [address]    Report Status PENDING  Incomplete     Labs: BNP (last 3 results) Recent Labs    02/11/18 2307  BNP 852.7*   Basic Metabolic Panel: Recent Labs  Lab 05/12/18 1731 05/14/18 0429  NA 131* 138  K 4.5 4.1  CL 95* 106  CO2 26 25  GLUCOSE 410* 119*  BUN 38* 28*  CREATININE 2.09* 2.20*  CALCIUM 9.2 8.3*   Liver Function Tests: Recent Labs  Lab 05/12/18 1731  AST 11*  ALT 11  ALKPHOS 91  BILITOT 0.6  PROT 7.7  ALBUMIN 3.1*   No results for input(s): LIPASE, AMYLASE in the last 168 hours. No results for input(s): AMMONIA in the last 168 hours. CBC: Recent Labs  Lab 05/12/18 1731 05/14/18 0429  WBC 16.5* 10.4  NEUTROABS 12.8*  --   HGB 10.9* 8.4*  HCT 32.4* 26.5*  MCV 83.7 86.0  PLT 474* 352   Cardiac Enzymes: No results for input(s): CKTOTAL, CKMB, CKMBINDEX, TROPONINI in the last 168 hours. BNP: Invalid input(s): POCBNP CBG: Recent Labs  Lab 05/14/18 0055 05/14/18 0443 05/14/18 0736 05/14/18 1207 05/14/18 1605  GLUCAP 149* 107* 97 171* 149*   D-Dimer No results for input(s): DDIMER in the last 72 hours. Hgb A1c No results for input(s): HGBA1C in the last 72 hours. Lipid Profile No results for input(s): CHOL, HDL, LDLCALC, TRIG, CHOLHDL, LDLDIRECT in the last 72 hours. Thyroid function studies No results for input(s): TSH, T4TOTAL, T3FREE, THYROIDAB in the last 72 hours.  Invalid input(s): FREET3 Anemia work up No results for input(s): VITAMINB12, FOLATE, FERRITIN, TIBC, IRON, RETICCTPCT in the last 72 hours. Urinalysis    Component Value Date/Time   COLORURINE STRAW (A) 02/14/2018 1355   APPEARANCEUR CLEAR 02/14/2018 1355   LABSPEC 1.009 02/14/2018 1355   PHURINE 6.0 02/14/2018 1355   GLUCOSEU 50 (A) 02/14/2018 1355   HGBUR SMALL (A) 02/14/2018 1355   BILIRUBINUR NEGATIVE 02/14/2018 1355   KETONESUR NEGATIVE 02/14/2018 1355   PROTEINUR 100 (A) 02/14/2018 1355   UROBILINOGEN 1.0 07/13/2015 1543    NITRITE NEGATIVE 02/14/2018 1355   LEUKOCYTESUR NEGATIVE 02/14/2018 1355   Sepsis Labs Invalid input(s): PROCALCITONIN,  WBC,  LACTICIDVEN Microbiology Recent Results (from the past 240 hour(s))  Wound or Superficial Culture     Status: None (Preliminary result)   Collection Time: 05/12/18  7:12 PM  Result Value Ref Range Status   Specimen Description   Final    WOUND LEFT FOOT Performed at [facility], [place] [address] [place], [place] [address]    Special Requests NONE  Final   Gram Stain   Final    ABUNDANT WBC PRESENT, PREDOMINANTLY PMN MODERATE GRAM POSITIVE COCCI IN CLUSTERS    Culture   Final    CULTURE REINCUBATED FOR BETTER GROWTH Performed at [facility], [place] [address], [place], [place] [address]    Report Status PENDING  Incomplete  Culture, blood (routine x 2)     Status: None (Preliminary result)   Collection Time: 05/12/18  7:33 PM  Result Value Ref Range Status   Specimen Description   Final    BLOOD RIGHT ANTECUBITAL Performed  at [facility], [place] [address] [place], [place] [address]    Special Requests   Final    BOTTLES DRAWN AEROBIC AND ANAEROBIC Blood Culture adequate volume Performed at [facility] [address], [place], [place] [address]    Culture   Final    NO GROWTH 2 DAYS Performed at [place] [address], [place], [place] [address]    Report Status PENDING  Incomplete  Culture, blood (routine x 2)     Status: None (Preliminary result)   Collection Time: 05/12/18  7:55 PM  Result Value Ref Range Status   Specimen Description   Final    BLOOD RIGHT HAND Performed at [place] [address] [place], [place] [address]    Special Requests   Final    BOTTLES DRAWN AEROBIC AND ANAEROBIC Blood Culture results may not be optimal due to an excessive volume of blood received in culture bottles Performed at [place] [place] [address], [place], [place] [address]    Culture   Final    NO GROWTH 2 DAYS Performed at [place] [address] [place], [place] [address]    Report Status PENDING  Incomplete  MRSA PCR Screening     Status: None   Collection Time: 05/13/18  4:05 AM  Result Value Ref Range Status   MRSA by PCR NEGATIVE NEGATIVE Final    Comment:        The GeneXpert MRSA Assay (FDA approved for NASAL specimens only), is one component of a comprehensive MRSA colonization surveillance program. It is not intended to diagnose MRSA infection nor to guide or monitor treatment for MRSA infections. Performed at [facility], [place] [address] [place], [place] [address]   Aerobic/Anaerobic Culture (surgical/deep wound)     Status: None (Preliminary result)   Collection Time: 05/13/18  5:40 PM  Result Value Ref Range Status   Specimen Description ABSCESS  Final   Special Requests   Final    PT ON ZOSYN,VANCOMYCIN SAMPLE FROM BETWEEN BIG AND SECOND TOES   Gram Stain   Final    MODERATE WBC PRESENT,BOTH PMN AND MONONUCLEAR RARE GRAM POSITIVE COCCI    Culture   Final    CULTURE REINCUBATED FOR BETTER GROWTH Performed at [facility], [place] [address], [place], [place] [address]    Report Status PENDING  Incomplete     Time coordinating discharge: 35 minutes The [place] controlled substances registry was reviewed for this patient prior to filling the <5 days supply controlled substances script.      SIGNED:   Edwin Dada, MD  [facility] 05/14/2018, 5:27 PM

## 2018-05-14 NOTE — Evaluation (Signed)
Physical Therapy Evaluation Patient Details Name: Melissa Howe MRN: [id number] DOB: 02-27-1951 Today's Date: 05/14/2018   History of Present Illness  Pt is a 67 y/o female admitted secondary to L foot pain and swelling. Imaging revealed metallic foreign body lateral to proximal phalanx of L great toe. Pt is /sp removal of foreign boday and excisional debridement. PMH includes HTN, DM, and CKD.   Clinical Impression  Pt is s/p surgery above with deficits below. Pt limited this session secondary to nausea/vomiting. Pt requiring supervision to min guard for mobility tasks this session with use of RW. Educated about DME recommendations and use of post op shoe during mobility. Will need to see one more time before d/c to determine safety with longer distance ambulation and stair management. Will continue to follow acutely to maximize functional mobility independence and safety.     Follow Up Recommendations Home health PT;Supervision for mobility/OOB    Equipment Recommendations  Rolling walker with 5" wheels;3in1 (PT)    Recommendations for Other Services       Precautions / Restrictions Precautions Precautions: Fall Required Braces or Orthoses: Other Brace/Splint Other Brace/Splint: L post op shoe Restrictions Weight Bearing Restrictions: Yes LLE Weight Bearing: Weight bearing as tolerated Other Position/Activity Restrictions: in post op shoe       Mobility  Bed Mobility Overal bed mobility: Needs Assistance Bed Mobility: Supine to Sit     Supine to sit: Supervision     General bed mobility comments: Supervision for safety. Increased time required.   Transfers Overall transfer level: Needs assistance Equipment used: Rolling walker (2 wheeled) Transfers: Sit to/from Stand Sit to Stand: Min guard         General transfer comment: Min guard for safety. Verbal cues for safe hand placement. Increased time required for transfer.   Ambulation/Gait Ambulation/Gait  assistance: Min guard Gait Distance (Feet): 5 Feet Assistive device: Rolling walker (2 wheeled) Gait Pattern/deviations: Step-to pattern;Decreased step length - right;Decreased step length - left;Decreased weight shift to left;Antalgic Gait velocity: Decreased    General Gait Details: Slow, antalgic gait. Required verbal cues for sequencing using RW. Pt with increased nausea, therefore distance limited to chair. Educated about use of RW at home to increase safety.   Stairs            Wheelchair Mobility    Modified [place] (Stroke Patients Only)       Balance Overall balance assessment: Needs assistance Sitting-balance support: No upper extremity supported;Feet supported Sitting balance-Leahy Scale: Good     Standing balance support: Bilateral upper extremity supported;During functional activity Standing balance-Leahy Scale: Poor Standing balance comment: Reliant on BUE support.                              Pertinent Vitals/Pain Pain Assessment: No/denies pain    Home Living Family/patient expects to be discharged to:: Private residence Living Arrangements: Children Available Help at Discharge: Family;Available PRN/intermittently Type of Home: House Home Access: Level entry     Home Layout: Two level Home Equipment: None      Prior Function Level of Independence: Independent               Hand Dominance   Dominant Hand: Right    Extremity/Trunk Assessment   Upper Extremity Assessment Upper Extremity Assessment: Overall WFL for tasks assessed    Lower Extremity Assessment Lower Extremity Assessment: LLE deficits/detail LLE Deficits / Details: Deficits consistent with post op pain  and weakness.     Cervical / Trunk Assessment Cervical / Trunk Assessment: Normal  Communication   Communication: No difficulties  Cognition Arousal/Alertness: Awake/alert Behavior During Therapy: WFL for tasks assessed/performed Overall Cognitive  Status: Within Functional Limits for tasks assessed                                        General Comments General comments (skin integrity, edema, etc.): Pt's sister present during session.     Exercises     Assessment/Plan    PT Assessment Patient needs continued PT services  PT Problem List Decreased strength;Decreased balance;Decreased activity tolerance;Decreased mobility;Decreased knowledge of use of DME;Decreased knowledge of precautions       PT Treatment Interventions DME instruction;Stair training;Gait training;Functional mobility training;Therapeutic exercise;Therapeutic activities;Balance training;Patient/family education    PT Goals (Current goals can be found in the Care Plan section)  Acute Rehab PT Goals Patient Stated Goal: "to feel better" PT Goal Formulation: With patient Time For Goal Achievement: 05/28/18 Potential to Achieve Goals: Good    Frequency Min 3X/week   Barriers to discharge        Co-evaluation               AM-PAC PT "6 Clicks" Daily Activity  Outcome Measure Difficulty turning over in bed (including adjusting bedclothes, sheets and blankets)?: A Little Difficulty moving from lying on back to sitting on the side of the bed? : A Little Difficulty sitting down on and standing up from a chair with arms (e.g., wheelchair, bedside commode, etc,.)?: Unable Help needed moving to and from a bed to chair (including a wheelchair)?: A Little Help needed walking in hospital room?: A Little Help needed climbing 3-5 steps with a railing? : A Lot 6 Click Score: 15    End of Session Equipment Utilized During Treatment: Gait belt Activity Tolerance: Treatment limited secondary to medical complications (Comment)(nausea) Patient left: in chair;with call bell/phone within reach;with family/visitor present Nurse Communication: Mobility status;Other (comment)(nausea, IV site leaking ) PT Visit Diagnosis: Other abnormalities of gait  and mobility (R26.89);Difficulty in walking, not elsewhere classified (R26.2);Unsteadiness on feet (R26.81)    Time: 1027-2536 PT Time Calculation (min) (ACUTE ONLY): 24 min   Charges:   PT Evaluation $PT Eval Low Complexity: 1 Low PT Treatments $Therapeutic Activity: 8-22 mins   PT G Codes:        Leighton Ruff, PT, DPT  Acute Rehabilitation Services  Pager: [phone]   Rudean Hitt 05/14/2018, 11:54 AM

## 2018-05-14 NOTE — Care Management Note (Signed)
Case Management Note  Patient Details  Name: Lorrin Bodner MRN: [id number] Date of Birth: February 17, 1951  Subjective/Objective:  67 yr old female s/p I &D of left great toe, removal of foreign body.                   Action/Plan: Case manager spoke with patient concerning discharge plan and DME. Choice for [facility] offered, Case manager called referral to [place], [facility] Liaison. Patient iwill have family support at discharge.   Expected Discharge Date:  05/14/18               Expected Discharge Plan:  [place]  In-House Referral:  NA  Discharge planning Services  CM Consult  Post Acute Care Choice:  Durable Medical Equipment, Home Health Choice offered to:  Patient  DME Arranged:  3-N-1, Walker rolling DME Agency:  [place]:  PT [place]:  [place]  Status of Service:     If discussed at [place] of Stay Meetings, dates discussed:    Additional Comments:  Ninfa Meeker, RN 05/14/2018, 2:31 PM

## 2018-05-14 NOTE — Progress Notes (Signed)
Orthopedic Tech Progress Note Patient Details:  Melissa Howe 1951-04-17 [id number]  Patient ID: Melissa Howe, female   DOB: Jun 14, 1951, 67 y.o.   MRN: [id number]   Melissa Howe 05/14/2018, 10:03 AMCalled [employer] for cast shoe.

## 2018-05-14 NOTE — Progress Notes (Addendum)
Subjective: Pt stable - pain ok   Objective: Vital signs in last 24 hours: Temp:  [97.3 F (36.3 C)-98.6 F (37 C)] 98.4 F (36.9 C) (07/19 0441) Pulse Rate:  [64-73] 68 (07/19 0441) Resp:  [11-18] 11 (07/18 1815) BP: (114-153)/(61-89) 114/61 (07/19 0441) SpO2:  [97 %-100 %] 98 % (07/19 0441)  Intake/Output from previous day: 07/18 0701 - 07/19 0700 In: 1088.8 [P.O.:120; I.V.:918.8; IV Piggyback:50] Out: 2641 [Urine:1050; Blood:10] Intake/Output this shift: No intake/output data recorded.  Exam:  No cellulitis present  Labs: Recent Labs    05/12/18 1731 05/14/18 0429  HGB 10.9* 8.4*   Recent Labs    05/12/18 1731 05/14/18 0429  WBC 16.5* 10.4  RBC 3.87 3.08*  HCT 32.4* 26.5*  PLT 474* 352   Recent Labs    05/12/18 1731 05/14/18 0429  NA 131* 138  K 4.5 4.1  CL 95* 106  CO2 26 25  BUN 38* 28*  CREATININE 2.09* 2.20*  GLUCOSE 410* 119*  CALCIUM 9.2 8.3*   No results for input(s): LABPT, INR in the last 72 hours.  Assessment/Plan: Ok for  [employer] today from ortho perspective - f/u with Sharol Given on Tuesday - ok to wbat in cast shoe Will need hh for dressing change on Sunday - will need to go home on po abx   [employer] 05/14/2018, 8:13 AM

## 2018-05-14 NOTE — Progress Notes (Signed)
05/14/18 1341  PT Visit Information  Last PT Received On 05/14/18  Assistance Needed +1  History of Present Illness Pt is a 67 y/o female admitted secondary to L foot pain and swelling. Imaging revealed metallic foreign body lateral to proximal phalanx of L great toe. Pt is /sp removal of foreign boday and excisional debridement. PMH includes HTN, DM, and CKD.   Subjective Data  Patient Stated Goal "to feel better"  Precautions  Precautions Fall  Required Braces or Orthoses Other Brace/Splint  Other Brace/Splint L post op shoe  Restrictions  Weight Bearing Restrictions Yes  LLE Weight Bearing WBAT  Other Position/Activity Restrictions in post op shoe   Pain Assessment  Pain Assessment No/denies pain  Cognition  Arousal/Alertness Awake/alert  Behavior During Therapy WFL for tasks assessed/performed  Overall Cognitive Status Within Functional Limits for tasks assessed  Bed Mobility  Overal bed mobility Needs Assistance  Bed Mobility Sit to Supine  Sit to supine Supervision  General bed mobility comments Supervision for safety.   Transfers  Overall transfer level Needs assistance  Equipment used Rolling walker (2 wheeled)  Transfers Sit to/from Stand  Sit to Stand Supervision  General transfer comment Supervision for safety. Demonstrated safe hand placement.   Ambulation/Gait  Ambulation/Gait assistance Supervision  Gait Distance (Feet) 5 Feet  Assistive device Rolling walker (2 wheeled)  Gait Pattern/deviations Step-to pattern;Decreased step length - right;Decreased step length - left;Decreased weight shift to left;Antalgic  General Gait Details Slow, guarded gait. Distance limited to bed as pt with increased nausea. Good technique using RW.   Gait velocity Decreased   Stairs Yes  General stair comments Verbally educated and provided handout for safe stair navigation. Discussed assist needed for transfers. Discussed staying on first floor to increase safety until pt works  with PT.   Balance  Overall balance assessment Needs assistance  Sitting-balance support No upper extremity supported;Feet supported  Sitting balance-Leahy Scale Good  Standing balance support Bilateral upper extremity supported;During functional activity  Standing balance-Leahy Scale Poor  Standing balance comment Reliant on BUE support.   General Comments  General comments (skin integrity, edema, etc.) Pt's sister present during session.   PT - End of Session  Equipment Utilized During Treatment Gait belt  Activity Tolerance Treatment limited secondary to medical complications (Comment) (nausea )  Patient left in bed;with call bell/phone within reach;with family/visitor present  Nurse Communication Mobility status   PT - Assessment/Plan  PT Plan Current plan remains appropriate  PT Visit Diagnosis Other abnormalities of gait and mobility (R26.89);Difficulty in walking, not elsewhere classified (R26.2);Unsteadiness on feet (R26.81)  PT Frequency (ACUTE ONLY) Min 3X/week  Follow Up Recommendations Home health PT;Supervision for mobility/OOB  PT equipment Rolling walker with 5" wheels;3in1 (PT)  AM-PAC PT "6 Clicks" Daily Activity Outcome Measure  Difficulty turning over in bed (including adjusting bedclothes, sheets and blankets)? 3  Difficulty moving from lying on back to sitting on the side of the bed?  3  Difficulty sitting down on and standing up from a chair with arms (e.g., wheelchair, bedside commode, etc,.)? 3  Help needed moving to and from a bed to chair (including a wheelchair)? 3  Help needed walking in hospital room? 3  Help needed climbing 3-5 steps with a railing?  2  6 Click Score 17  Mobility G Code  CK  PT Goal Progression  Progress towards PT goals Progressing toward goals  Acute Rehab PT Goals  PT Goal Formulation With patient  Time For Goal  Achievement 05/28/18  Potential to Achieve Goals Good  PT Time Calculation  PT Start Time (ACUTE ONLY) 1315  PT Stop  Time (ACUTE ONLY) 1326  PT Time Calculation (min) (ACUTE ONLY) 11 min  PT General Charges  $$ ACUTE PT VISIT 1 Visit  PT Treatments  $Therapeutic Activity 8-22 mins   Attempted to perform longer gait distance this afternoon, however, pt continues to be limited by nausea. Educated and administered handout about safe stair navigation at home and educated about importance of assist at home during stair navigation, in case pt d/c's home in evening. Discussed waiting on HHPT to attempt stairs at home to increase safety as pt able to stay on the 1st floor at home. Will continue to follow acutely to maximize functional mobility independence and safety.   Leighton Ruff, PT, DPT  Acute Rehabilitation Services  Pager: [phone]

## 2018-05-14 NOTE — Progress Notes (Signed)
Pt given discharge instructions and gone over with her about instructions from MD and appointments to follow up with Dr. Sharol Given and PCP next week. Pt verbalized understanding. RN answered all questions to satisfaction. Pt has equipment in room. Pt calling daughter to pick her up now.

## 2018-05-15 LAB — AEROBIC CULTURE W GRAM STAIN (SUPERFICIAL SPECIMEN)

## 2018-05-17 ENCOUNTER — Encounter (HOSPITAL_COMMUNITY): Payer: Self-pay | Admitting: Orthopedic Surgery

## 2018-05-17 ENCOUNTER — Telehealth (INDEPENDENT_AMBULATORY_CARE_PROVIDER_SITE_OTHER): Payer: Self-pay | Admitting: Orthopedic Surgery

## 2018-05-17 LAB — CULTURE, BLOOD (ROUTINE X 2)
Culture: NO GROWTH
Culture: NO GROWTH
Special Requests: ADEQUATE

## 2018-05-17 NOTE — Telephone Encounter (Signed)
Melissa Howe from [facility] called stated had order for patient PT and patient has refused PT and getting around ok. Pt requesting a nurse to come out and check dressing it had dried blood in it. Nicki Reaper is not sure if its the same dressing from surgery.  Please call [phone]

## 2018-05-17 NOTE — Telephone Encounter (Signed)
Number provided for Scott incorrect number. I called facility advised that per Dr Marlou Sa patient needs to see Dr Sharol Given to eval wound post operatively. Scheduled appt for tomorrow 930 to see Dr Sharol Given

## 2018-05-18 ENCOUNTER — Ambulatory Visit (INDEPENDENT_AMBULATORY_CARE_PROVIDER_SITE_OTHER): Payer: Medicare Other | Admitting: Orthopedic Surgery

## 2018-05-18 ENCOUNTER — Encounter (INDEPENDENT_AMBULATORY_CARE_PROVIDER_SITE_OTHER): Payer: Self-pay | Admitting: Orthopedic Surgery

## 2018-05-18 VITALS — Ht 64.0 in | Wt 170.0 lb

## 2018-05-18 DIAGNOSIS — L02612 Cutaneous abscess of left foot: Secondary | ICD-10-CM

## 2018-05-18 DIAGNOSIS — E1142 Type 2 diabetes mellitus with diabetic polyneuropathy: Secondary | ICD-10-CM

## 2018-05-18 DIAGNOSIS — E43 Unspecified severe protein-calorie malnutrition: Secondary | ICD-10-CM

## 2018-05-18 LAB — AEROBIC/ANAEROBIC CULTURE W GRAM STAIN (SURGICAL/DEEP WOUND): Culture: NORMAL

## 2018-05-18 NOTE — Telephone Encounter (Signed)
Please advise on message below concerning orders for [facility].

## 2018-05-18 NOTE — Telephone Encounter (Signed)
I called Scott and advised
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
## 2018-05-18 NOTE — Telephone Encounter (Signed)
If patient does not want PT that's ok, does not need wound care, follow up for dressing change in office

## 2018-05-18 NOTE — Telephone Encounter (Signed)
Scott with [facility] is trying to follow up on a home health nursing order to see if its needed since the patient declined physical therapy. Please call Nicki Reaper if needed # [phone]

## 2018-05-19 ENCOUNTER — Other Ambulatory Visit (INDEPENDENT_AMBULATORY_CARE_PROVIDER_SITE_OTHER): Payer: Self-pay | Admitting: Family

## 2018-05-20 ENCOUNTER — Other Ambulatory Visit: Payer: Self-pay

## 2018-05-20 ENCOUNTER — Telehealth (INDEPENDENT_AMBULATORY_CARE_PROVIDER_SITE_OTHER): Payer: Self-pay | Admitting: Orthopedic Surgery

## 2018-05-20 ENCOUNTER — Encounter (HOSPITAL_COMMUNITY): Payer: Self-pay | Admitting: *Deleted

## 2018-05-20 NOTE — Progress Notes (Signed)
I instructed Ms Kessenich that if CBG> 70 in am to take 1/2 dose of Levemir- 15 units. I instructed patient to check CBG after awaking and every 2 hours until arrival  to the hospital.  I Instructed patient if CBG is less than 70 to drink 1/2 cup of a clear juice or regular soda.. Recheck CBG in 15 minutes then call pre- op desk at [phone] for further instructions. If scheduled to receive Insulin, do not take Insulin

## 2018-05-20 NOTE — Telephone Encounter (Signed)
Left message on patient's voicemail that case at [facility] with Dr. Sharol Given has moved to earlier time tomorrow (8:50am) and that she will need to have Crystal (her daughter) have her there at 6:50am

## 2018-05-21 ENCOUNTER — Encounter (HOSPITAL_COMMUNITY): Payer: Self-pay | Admitting: Certified Registered Nurse Anesthetist

## 2018-05-21 ENCOUNTER — Ambulatory Visit (HOSPITAL_COMMUNITY)
Admission: RE | Admit: 2018-05-21 | Discharge: 2018-05-21 | Disposition: A | Discharge status: Home / Self Care | Payer: Medicare Other | Source: Ambulatory Visit | Attending: Orthopedic Surgery | Admitting: Orthopedic Surgery

## 2018-05-21 ENCOUNTER — Encounter (HOSPITAL_COMMUNITY)
Admission: RE | Disposition: A | Discharge status: Home / Self Care | Payer: Self-pay | Source: Ambulatory Visit | Attending: Orthopedic Surgery

## 2018-05-21 ENCOUNTER — Ambulatory Visit (HOSPITAL_COMMUNITY): Payer: Medicare Other | Admitting: Anesthesiology

## 2018-05-21 DIAGNOSIS — L02612 Cutaneous abscess of left foot: Secondary | ICD-10-CM | POA: Diagnosis not present

## 2018-05-21 DIAGNOSIS — E1140 Type 2 diabetes mellitus with diabetic neuropathy, unspecified: Secondary | ICD-10-CM | POA: Insufficient documentation

## 2018-05-21 DIAGNOSIS — I10 Essential (primary) hypertension: Secondary | ICD-10-CM | POA: Diagnosis not present

## 2018-05-21 DIAGNOSIS — Z79899 Other long term (current) drug therapy: Secondary | ICD-10-CM | POA: Insufficient documentation

## 2018-05-21 DIAGNOSIS — Z87891 Personal history of nicotine dependence: Secondary | ICD-10-CM | POA: Insufficient documentation

## 2018-05-21 DIAGNOSIS — X58XXXA Exposure to other specified factors, initial encounter: Secondary | ICD-10-CM | POA: Insufficient documentation

## 2018-05-21 DIAGNOSIS — Z8249 Family history of ischemic heart disease and other diseases of the circulatory system: Secondary | ICD-10-CM | POA: Diagnosis not present

## 2018-05-21 DIAGNOSIS — E785 Hyperlipidemia, unspecified: Secondary | ICD-10-CM | POA: Diagnosis not present

## 2018-05-21 DIAGNOSIS — Z7982 Long term (current) use of aspirin: Secondary | ICD-10-CM | POA: Insufficient documentation

## 2018-05-21 DIAGNOSIS — S91302A Unspecified open wound, left foot, initial encounter: Secondary | ICD-10-CM | POA: Insufficient documentation

## 2018-05-21 DIAGNOSIS — Z794 Long term (current) use of insulin: Secondary | ICD-10-CM | POA: Diagnosis not present

## 2018-05-21 HISTORY — DX: Adverse effect of unspecified anesthetic, initial encounter: T41.45XA

## 2018-05-21 HISTORY — DX: Other complications of anesthesia, initial encounter: T88.59XA

## 2018-05-21 HISTORY — DX: Other specified postprocedural states: Z98.890

## 2018-05-21 HISTORY — DX: Dyspnea, unspecified: R06.00

## 2018-05-21 HISTORY — DX: Constipation, unspecified: K59.00

## 2018-05-21 HISTORY — PX: I&D EXTREMITY: SHX5045

## 2018-05-21 HISTORY — DX: Nausea with vomiting, unspecified: R11.2

## 2018-05-21 LAB — CBC
HCT: 30.2 % — ABNORMAL LOW (ref 36.0–46.0)
Hemoglobin: 9.4 g/dL — ABNORMAL LOW (ref 12.0–15.0)
MCH: 27.6 pg (ref 26.0–34.0)
MCHC: 31.1 g/dL (ref 30.0–36.0)
MCV: 88.8 fL (ref 78.0–100.0)
Platelets: 520 10*3/uL — ABNORMAL HIGH (ref 150–400)
RBC: 3.4 MIL/uL — ABNORMAL LOW (ref 3.87–5.11)
RDW: 12.8 % (ref 11.5–15.5)
WBC: 8.3 10*3/uL (ref 4.0–10.5)

## 2018-05-21 LAB — BASIC METABOLIC PANEL
Anion gap: 12 (ref 5–15)
BUN: 30 mg/dL — ABNORMAL HIGH (ref 8–23)
CO2: 24 mmol/L (ref 22–32)
Calcium: 9 mg/dL (ref 8.9–10.3)
Chloride: 105 mmol/L (ref 98–111)
Creatinine, Ser: 1.79 mg/dL — ABNORMAL HIGH (ref 0.44–1.00)
GFR calc Af Amer: 32 mL/min — ABNORMAL LOW (ref >60)
GFR calc non Af Amer: 28 mL/min — ABNORMAL LOW (ref >60)
Glucose, Bld: 59 mg/dL — ABNORMAL LOW (ref 70–99)
Potassium: 3.6 mmol/L (ref 3.5–5.1)
Sodium: 141 mmol/L (ref 135–145)

## 2018-05-21 LAB — HEMOGLOBIN A1C
Hgb A1c MFr Bld: 11.6 % — ABNORMAL HIGH (ref 4.8–5.6)
Mean Plasma Glucose: 286.22 mg/dL

## 2018-05-21 LAB — GLUCOSE, CAPILLARY
Glucose-Capillary: 106 mg/dL — ABNORMAL HIGH (ref 70–99)
Glucose-Capillary: 51 mg/dL — ABNORMAL LOW (ref 70–99)
Glucose-Capillary: 72 mg/dL (ref 70–99)
Glucose-Capillary: 101 mg/dL — ABNORMAL HIGH (ref 70–99)
Glucose-Capillary: 66 mg/dL — ABNORMAL LOW (ref 70–99)

## 2018-05-21 SURGERY — IRRIGATION AND DEBRIDEMENT EXTREMITY
Anesthesia: Monitor Anesthesia Care | Site: Foot | Laterality: Left

## 2018-05-21 MED ORDER — MIDAZOLAM HCL 5 MG/5ML IJ SOLN
INTRAMUSCULAR | Status: DC | PRN
  Administered 2018-05-21: 2 mg via INTRAVENOUS

## 2018-05-21 MED ORDER — 0.9 % SODIUM CHLORIDE (POUR BTL) OPTIME
TOPICAL | Status: DC | PRN
  Administered 2018-05-21: 1000 mL

## 2018-05-21 MED ORDER — DEXTROSE 50 % IV SOLN
INTRAVENOUS | Status: AC
  Administered 2018-05-21: 25 mL
  Filled 2018-05-21: qty 50

## 2018-05-21 MED ORDER — SODIUM CHLORIDE 0.9 % IJ SOLN
INTRAMUSCULAR | Status: AC
  Filled 2018-05-21: qty 10

## 2018-05-21 MED ORDER — DEXTROSE 50 % IV SOLN
25.0000 mL | Freq: Once | INTRAVENOUS | Status: DC
  Filled 2018-05-21: qty 50

## 2018-05-21 MED ORDER — CEFAZOLIN SODIUM-DEXTROSE 2-3 GM-%(50ML) IV SOLR
INTRAVENOUS | Status: DC | PRN
  Administered 2018-05-21: 2 g via INTRAVENOUS

## 2018-05-21 MED ORDER — EPHEDRINE SULFATE-NACL 50-0.9 MG/10ML-% IV SOSY
PREFILLED_SYRINGE | INTRAVENOUS | Status: DC | PRN
  Administered 2018-05-21: 5 mg via INTRAVENOUS
  Administered 2018-05-21: 10 mg via INTRAVENOUS

## 2018-05-21 MED ORDER — LIDOCAINE 2% (20 MG/ML) 5 ML SYRINGE
INTRAMUSCULAR | Status: DC | PRN
  Administered 2018-05-21: 80 mg via INTRAVENOUS

## 2018-05-21 MED ORDER — FENTANYL CITRATE (PF) 250 MCG/5ML IJ SOLN
INTRAMUSCULAR | Status: AC
  Filled 2018-05-21: qty 5

## 2018-05-21 MED ORDER — PROPOFOL 10 MG/ML IV BOLUS
INTRAVENOUS | Status: DC | PRN
  Administered 2018-05-21: 150 mg via INTRAVENOUS

## 2018-05-21 MED ORDER — MIDAZOLAM HCL 2 MG/2ML IJ SOLN
INTRAMUSCULAR | Status: AC
  Filled 2018-05-21: qty 2

## 2018-05-21 MED ORDER — MIDAZOLAM HCL 2 MG/2ML IJ SOLN
INTRAMUSCULAR | Status: AC
Start: 2018-05-21 — End: 2018-05-21
  Filled 2018-05-21: qty 2

## 2018-05-21 MED ORDER — FENTANYL CITRATE (PF) 100 MCG/2ML IJ SOLN
INTRAMUSCULAR | Status: AC
  Filled 2018-05-21: qty 2

## 2018-05-21 MED ORDER — EPHEDRINE SULFATE 50 MG/ML IJ SOLN
INTRAMUSCULAR | Status: AC
  Filled 2018-05-21: qty 1

## 2018-05-21 MED ORDER — FENTANYL CITRATE (PF) 250 MCG/5ML IJ SOLN
INTRAMUSCULAR | Status: DC | PRN
  Administered 2018-05-21: 25 ug via INTRAVENOUS

## 2018-05-21 MED ORDER — FENTANYL CITRATE (PF) 100 MCG/2ML IJ SOLN: INTRAMUSCULAR | Status: DC | PRN

## 2018-05-21 MED ORDER — ONDANSETRON HCL 4 MG/2ML IJ SOLN
INTRAMUSCULAR | Status: DC | PRN
  Administered 2018-05-21: 4 mg via INTRAVENOUS

## 2018-05-21 MED ORDER — LACTATED RINGERS IV SOLN
INTRAVENOUS | Status: DC
  Administered 2018-05-21: 07:00:00 via INTRAVENOUS

## 2018-05-21 MED ORDER — CEFAZOLIN SODIUM-DEXTROSE 2-4 GM/100ML-% IV SOLN
INTRAVENOUS | Status: AC
  Filled 2018-05-21: qty 100

## 2018-05-21 SURGICAL SUPPLY — 33 items
BENZOIN TINCTURE PRP APPL 2/3 (GAUZE/BANDAGES/DRESSINGS) ×8 IMPLANT
BLADE SAW SGTL MED 73X18.5 STR (BLADE) IMPLANT
BLADE SURG 21 STRL SS (BLADE) ×2 IMPLANT
BNDG COHESIVE 4X5 TAN STRL (GAUZE/BANDAGES/DRESSINGS) ×2 IMPLANT
BNDG GAUZE ELAST 4 BULKY (GAUZE/BANDAGES/DRESSINGS) ×2 IMPLANT
COVER SURGICAL LIGHT HANDLE (MISCELLANEOUS) ×4 IMPLANT
DRAPE INCISE IOBAN 66X45 STRL (DRAPES) ×2 IMPLANT
DRAPE U-SHAPE 47X51 STRL (DRAPES) ×4 IMPLANT
DRESSING PREVENA PLUS CUSTOM (GAUZE/BANDAGES/DRESSINGS) ×1 IMPLANT
DRSG ADAPTIC 3X8 NADH LF (GAUZE/BANDAGES/DRESSINGS) ×2 IMPLANT
DRSG PAD ABDOMINAL 8X10 ST (GAUZE/BANDAGES/DRESSINGS) ×4 IMPLANT
DRSG PREVENA PLUS CUSTOM (GAUZE/BANDAGES/DRESSINGS) ×2
DURAPREP 26ML APPLICATOR (WOUND CARE) ×2 IMPLANT
ELECT REM PT RETURN 9FT ADLT (ELECTROSURGICAL) ×2
ELECTRODE REM PT RTRN 9FT ADLT (ELECTROSURGICAL) ×1 IMPLANT
GAUZE SPONGE 4X4 12PLY STRL (GAUZE/BANDAGES/DRESSINGS) ×2 IMPLANT
GLOVE BIOGEL PI IND STRL 9 (GLOVE) ×1 IMPLANT
GLOVE BIOGEL PI INDICATOR 9 (GLOVE) ×1
GLOVE SURG ORTHO 9.0 STRL STRW (GLOVE) ×2 IMPLANT
GOWN STRL REUS W/ TWL XL LVL3 (GOWN DISPOSABLE) ×2 IMPLANT
GOWN STRL REUS W/TWL XL LVL3 (GOWN DISPOSABLE) ×2
KIT BASIN OR (CUSTOM PROCEDURE TRAY) ×2 IMPLANT
KIT PREVENA INCISION MGT 13 (CANNISTER) ×2 IMPLANT
KIT TURNOVER KIT B (KITS) ×2 IMPLANT
NS IRRIG 1000ML POUR BTL (IV SOLUTION) ×2 IMPLANT
PACK ORTHO EXTREMITY (CUSTOM PROCEDURE TRAY) ×2 IMPLANT
PAD ABD 8X10 STRL (GAUZE/BANDAGES/DRESSINGS) ×2 IMPLANT
PAD ARMBOARD 7.5X6 YLW CONV (MISCELLANEOUS) ×4 IMPLANT
STOCKINETTE IMPERVIOUS LG (DRAPES) IMPLANT
SUT ETHILON 2 0 PSLX (SUTURE) ×4 IMPLANT
TOWEL OR 17X26 10 PK STRL BLUE (TOWEL DISPOSABLE) ×2 IMPLANT
TUBE CONNECTING 12X1/4 (SUCTIONS) ×2 IMPLANT
YANKAUER SUCT BULB TIP NO VENT (SUCTIONS) ×2 IMPLANT

## 2018-05-21 NOTE — Transfer of Care (Signed)
Immediate Anesthesia Transfer of Care Note  Patient: Melissa Howe  Procedure(s) Performed: LEFT FOOT DEBRIDEMENT AND WOUND CLOSURE (Left Foot)  Patient Location: PACU  Anesthesia Type:General  Level of Consciousness: drowsy  Airway & Oxygen Therapy: Patient Spontanous Breathing and Patient connected to nasal cannula oxygen  Post-op Assessment: Report given to RN and Post -op Vital signs reviewed and stable  Post vital signs: Reviewed and stable  Last Vitals:  Vitals Value Taken Time  BP 96/55 05/21/2018  9:05 AM  Temp 36.1 C 05/21/2018  9:04 AM  Pulse 54 05/21/2018  9:06 AM  Resp 13 05/21/2018  9:06 AM  SpO2 100 % 05/21/2018  9:06 AM  Vitals shown include unvalidated device data.  Last Pain:  Vitals:   05/21/18 0805  TempSrc:   PainSc: 0-No pain         Complications: No apparent anesthesia complications

## 2018-05-21 NOTE — Anesthesia Preprocedure Evaluation (Signed)
Anesthesia Evaluation  Patient identified by MRN, date of birth, ID band Patient awake    Reviewed: Allergy & Precautions, NPO status , Patient's Chart, lab work & pertinent test results  Airway Mallampati: II  TM Distance: >3 FB Neck ROM: Full    Dental  (+) Edentulous Upper, Edentulous Lower   Pulmonary former smoker,    breath sounds clear to auscultation       Cardiovascular hypertension,  Rhythm:Regular Rate:Normal     Neuro/Psych    GI/Hepatic   Endo/Other  diabetes  Renal/GU      Musculoskeletal   Abdominal   Peds  Hematology   Anesthesia Other Findings   Reproductive/Obstetrics                             Anesthesia Physical  Anesthesia Plan  ASA: III  Anesthesia Plan: MAC and Regional   Post-op Pain Management:    Induction: Intravenous  PONV Risk Score and Plan: 2 and Ondansetron and Midazolam  Airway Management Planned: Simple Face Mask  Additional Equipment:   Intra-op Plan:   Post-operative Plan:   Informed Consent: I have reviewed the patients History and Physical, chart, labs and discussed the procedure including the risks, benefits and alternatives for the proposed anesthesia with the patient or authorized representative who has indicated his/her understanding and acceptance.     Plan Discussed with: CRNA and Anesthesiologist  Anesthesia Plan Comments:         Anesthesia Quick Evaluation

## 2018-05-21 NOTE — Anesthesia Postprocedure Evaluation (Signed)
Anesthesia Post Note  Patient: Melissa Howe  Procedure(s) Performed: LEFT FOOT DEBRIDEMENT AND WOUND CLOSURE (Left Foot)     Patient location during evaluation: PACU Anesthesia Type: General Level of consciousness: awake and alert Pain management: pain level controlled Vital Signs Assessment: post-procedure vital signs reviewed and stable Respiratory status: spontaneous breathing, nonlabored ventilation and respiratory function stable Cardiovascular status: blood pressure returned to baseline and stable Postop Assessment: no apparent nausea or vomiting Anesthetic complications: no    Last Vitals:  Vitals:   05/21/18 1000 05/21/18 1030  BP: 127/65 (!) 149/75  Pulse: (!) 57 60  Resp: 12 16  Temp:  36.4 C  SpO2: 100% 100%    Last Pain:  Vitals:   05/21/18 1000  TempSrc:   PainSc: 0-No pain                 Lynda Rainwater

## 2018-05-21 NOTE — Op Note (Signed)
05/21/2018  9:03 AM  PATIENT:  Melissa Howe    PRE-OPERATIVE DIAGNOSIS:  abscess left foot  POST-OPERATIVE DIAGNOSIS:  Same  PROCEDURE:  LEFT FOOT DEBRIDEMENT AND WOUND CLOSURE, local tissue rearrangement for wound closure 6 x 2 cm  SURGEON:  Newt Minion, MD  PHYSICIAN ASSISTANT:None ANESTHESIA:   General  PREOPERATIVE INDICATIONS:  Derek Huneycutt is a  67 y.o. female with a diagnosis of abscess left foot who failed conservative measures and elected for surgical management.    The risks benefits and alternatives were discussed with the patient preoperatively including but not limited to the risks of infection, bleeding, nerve injury, cardiopulmonary complications, the need for revision surgery, among others, and the patient was willing to proceed.  OPERATIVE IMPLANTS: Praveena wound VAC  @ENCIMAGES @  OPERATIVE FINDINGS: Nonviable tissue no active abscess  OPERATIVE PROCEDURE: Patient was brought the operating room underwent a general anesthetic.  After adequate levels of anesthesia were obtained patient's left lower extremity was prepped using DuraPrep draped into a sterile field a timeout was called.  A elliptical incision was made around her previous surgical margins.  There is no active abscess however there was significant amount of nonviable tissue.  This was removed with a rondure.  The bone joint and capsule were not involved.  The wound was irrigated with normal saline there was improvement in the petechial bleeding.  Local tissue rearrangement was used for wound closure 2 x 6 cm.  The wound was closed without tension in the first webspace.  A 13 cm Praveena wound VAC sponge was applied dorsally extending plantarly.  This was sealed and had a good suction fit.  Patient was extubated taken to PACU in stable condition plan for discharge to home.   DISCHARGE PLANNING:  Antibiotic duration: Continue oral antibiotics  Weightbearing: Minimize weightbearing left lower  extremity  Pain medication: Patient has a prescription for Vicodin  Dressing care/ Wound VAC: Continue wound VAC for 1 week  Ambulatory devices: Crutches  Discharge to: Home  Follow-up: In the office 1 week post operative.

## 2018-05-21 NOTE — H&P (Signed)
Melissa Howe is an 67 y.o. female.   Chief Complaint: Left foot traumatic wound. HPI: Patient is a 67 year old woman with type 2 diabetes status post removal of a foreign body left foot first webspace.  Patient presents at this time for further debridement and wound closure.  Past Medical History:  Diagnosis Date  . Complication of anesthesia   . Constipation   . Diabetes mellitus    Type II  . Dyspnea   . Hyperlipemia   . Hypertension   . PONV (postoperative nausea and vomiting)     Past Surgical History:  Procedure Laterality Date  . APPENDECTOMY    . EYE SURGERY Bilateral    Cataract  . FOREIGN BODY REMOVAL Left 05/13/2018   Procedure: FOREIGN BODY REMOVAL left foot;  Surgeon: Meredith Pel, MD;  Location: [facility];  Service: Orthopedics;  Laterality: Left;  . TONSILLECTOMY    . TUBAL LIGATION      Family History  Problem Relation Age of Onset  . Hypertension Mother   . Diabetes Mother   . Hypertension Sister    Social History:  reports that she quit smoking about 34 years ago. Her smoking use included cigars. She quit after 4.00 years of use. She has never used smokeless tobacco. She reports that she does not drink alcohol or use drugs.  Allergies: No Known Allergies  Medications Prior to Admission  Medication Sig Dispense Refill  . amLODipine (NORVASC) 5 MG tablet Take 1 tablet (5 mg total) by mouth daily. 30 tablet 1  . amoxicillin-clavulanate (AUGMENTIN) 875-125 MG tablet Take 1 tablet by mouth 2 (two) times daily for 7 days. 14 tablet 0  . aspirin EC 81 MG tablet Take 1 tablet (81 mg total) by mouth daily. 30 tablet 0  . carvedilol (COREG) 6.25 MG tablet Take 1 tablet (6.25 mg total) by mouth 2 (two) times daily with a meal. 60 tablet 1  . HYDROcodone-acetaminophen (NORCO/VICODIN) 5-325 MG tablet Take 1-2 tablets by mouth every 4 (four) hours as needed for moderate pain (pain score 4-6). 12 tablet 0  . Insulin Detemir (LEVEMIR) 100 UNIT/ML Pen Inject 30  Units into the skin daily with breakfast. 15 mL 1  . Insulin Pen Needle 32G X 4 MM MISC Use with insulin pen to dispense insulin as directed 100 each 1  . rosuvastatin (CRESTOR) 20 MG tablet Take 1 tablet (20 mg total) by mouth daily. 30 tablet 1    No results found for this or any previous visit (from the past 48 hour(s)). No results found.  ROS  Blood pressure (!) 214/85, pulse 76, temperature 97.8 F (36.6 C), temperature source Oral, resp. rate 16, SpO2 100 %. Physical Exam  Examination patient is alert oriented no adenopathy well-dressed normal affect normal respiratory effort she has an antalgic gait she has good pulses.  She has a large open wound in the first webspace which is approximately 2 cm wide and 4 cm deep.  There is fibrinous tissue at the base the wound.  There is no purulence no abscess no cellulitis. Assessment/Plan Assessment: Diabetic insensate neuropathy with large wound first webspace status post removal of a foreign body.  Plan: We will plan for further debridement local tissue rearrangement for wound closure.  Risks and benefits were discussed including nonhealing of the wound need for additional surgery potential for amputation.  Patient states she understands wished to proceed at this time.  Newt Minion, MD 05/21/2018, 6:44 AM

## 2018-05-21 NOTE — Anesthesia Procedure Notes (Signed)
Procedure Name: LMA Insertion Date/Time: 05/21/2018 8:35 AM Performed by: Colin Benton, CRNA Pre-anesthesia Checklist: Patient identified, Emergency Drugs available, Suction available and Patient being monitored Patient Re-evaluated:Patient Re-evaluated prior to induction Oxygen Delivery Method: Circle system utilized Preoxygenation: Pre-oxygenation with 100% oxygen Induction Type: IV induction Ventilation: Mask ventilation without difficulty LMA: LMA inserted LMA Size: 4.0 Number of attempts: 1 Placement Confirmation: positive ETCO2 and breath sounds checked- equal and bilateral Tube secured with: Tape Dental Injury: Teeth and Oropharynx as per pre-operative assessment

## 2018-05-21 NOTE — Progress Notes (Addendum)
0650 pt's blood sugar 51, pt. Had taken levemir 10 units this am @ 0600. Pt is not symptomatic. Notified Dr. Sabra Heck, new orders received. 1/2 amp dextrose given.  0720 blood sugar 106. Will continue to monitor.  Pt. Did not take beta blocker this an, notified Dr. Sabra Heck, stated not to give it.

## 2018-05-22 ENCOUNTER — Encounter (HOSPITAL_COMMUNITY): Payer: Self-pay | Admitting: Orthopedic Surgery

## 2018-05-27 ENCOUNTER — Ambulatory Visit (INDEPENDENT_AMBULATORY_CARE_PROVIDER_SITE_OTHER): Payer: Medicare Other | Admitting: Orthopedic Surgery

## 2018-05-27 ENCOUNTER — Encounter (INDEPENDENT_AMBULATORY_CARE_PROVIDER_SITE_OTHER): Payer: Self-pay | Admitting: Orthopedic Surgery

## 2018-05-27 DIAGNOSIS — E43 Unspecified severe protein-calorie malnutrition: Secondary | ICD-10-CM | POA: Diagnosis not present

## 2018-05-27 DIAGNOSIS — L02612 Cutaneous abscess of left foot: Secondary | ICD-10-CM

## 2018-05-27 DIAGNOSIS — E1142 Type 2 diabetes mellitus with diabetic polyneuropathy: Secondary | ICD-10-CM

## 2018-05-30 ENCOUNTER — Encounter (INDEPENDENT_AMBULATORY_CARE_PROVIDER_SITE_OTHER): Payer: Self-pay | Admitting: Orthopedic Surgery
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
## 2018-06-03 ENCOUNTER — Ambulatory Visit (INDEPENDENT_AMBULATORY_CARE_PROVIDER_SITE_OTHER): Payer: Medicare Other | Admitting: Orthopedic Surgery

## 2018-06-03 ENCOUNTER — Encounter (INDEPENDENT_AMBULATORY_CARE_PROVIDER_SITE_OTHER): Payer: Self-pay | Admitting: Orthopedic Surgery

## 2018-06-03 VITALS — Ht 64.0 in | Wt 170.0 lb

## 2018-06-03 DIAGNOSIS — L02612 Cutaneous abscess of left foot: Secondary | ICD-10-CM

## 2018-06-03 DIAGNOSIS — E1142 Type 2 diabetes mellitus with diabetic polyneuropathy: Secondary | ICD-10-CM
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
## 2018-06-10 ENCOUNTER — Encounter (INDEPENDENT_AMBULATORY_CARE_PROVIDER_SITE_OTHER): Payer: Self-pay | Admitting: Orthopedic Surgery

## 2018-06-10 ENCOUNTER — Ambulatory Visit (INDEPENDENT_AMBULATORY_CARE_PROVIDER_SITE_OTHER): Payer: Medicare Other | Admitting: Orthopedic Surgery

## 2018-06-10 DIAGNOSIS — E43 Unspecified severe protein-calorie malnutrition: Secondary | ICD-10-CM | POA: Diagnosis not present

## 2018-06-10 DIAGNOSIS — L02612 Cutaneous abscess of left foot: Secondary | ICD-10-CM | POA: Diagnosis not present

## 2018-06-10 DIAGNOSIS — E1142 Type 2 diabetes mellitus with diabetic polyneuropathy: Secondary | ICD-10-CM

## 2018-06-10 MED ORDER — PENTOXIFYLLINE ER 400 MG PO TBCR: 400.0000 mg | EXTENDED_RELEASE_TABLET | Freq: Three times a day (TID) | ORAL | 3 refills | Status: DC
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
## 2018-06-17 ENCOUNTER — Encounter (INDEPENDENT_AMBULATORY_CARE_PROVIDER_SITE_OTHER): Payer: Self-pay | Admitting: Orthopedic Surgery

## 2018-06-17 ENCOUNTER — Ambulatory Visit (INDEPENDENT_AMBULATORY_CARE_PROVIDER_SITE_OTHER): Payer: Medicare Other | Admitting: Orthopedic Surgery

## 2018-06-17 DIAGNOSIS — L02612 Cutaneous abscess of left foot: Secondary | ICD-10-CM

## 2018-06-17 DIAGNOSIS — E1142 Type 2 diabetes mellitus with diabetic polyneuropathy: Secondary | ICD-10-CM | POA: Diagnosis not present
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
## 2018-07-07 ENCOUNTER — Ambulatory Visit (INDEPENDENT_AMBULATORY_CARE_PROVIDER_SITE_OTHER): Payer: Medicare Other | Admitting: Family

## 2018-07-14 ENCOUNTER — Ambulatory Visit (INDEPENDENT_AMBULATORY_CARE_PROVIDER_SITE_OTHER): Payer: Medicare Other | Admitting: Family

## 2018-07-14 ENCOUNTER — Encounter (INDEPENDENT_AMBULATORY_CARE_PROVIDER_SITE_OTHER): Payer: Self-pay | Admitting: Family

## 2018-07-14 DIAGNOSIS — L02612 Cutaneous abscess of left foot: Secondary | ICD-10-CM
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
## 2018-07-28 ENCOUNTER — Ambulatory Visit (INDEPENDENT_AMBULATORY_CARE_PROVIDER_SITE_OTHER): Payer: Medicare Other | Admitting: Family

## 2019-08-05 ENCOUNTER — Inpatient Hospital Stay (HOSPITAL_COMMUNITY)
Admission: EM | Admit: 2019-08-05 | Discharge: 2019-08-11 | DRG: 682 | Disposition: A | Discharge status: Home / Self Care | Payer: Medicare Other | Attending: Internal Medicine | Admitting: Internal Medicine

## 2019-08-05 ENCOUNTER — Other Ambulatory Visit: Payer: Self-pay

## 2019-08-05 ENCOUNTER — Emergency Department (HOSPITAL_COMMUNITY): Payer: Medicare Other

## 2019-08-05 DIAGNOSIS — I5032 Chronic diastolic (congestive) heart failure: Secondary | ICD-10-CM

## 2019-08-05 DIAGNOSIS — R0600 Dyspnea, unspecified: Secondary | ICD-10-CM | POA: Diagnosis present

## 2019-08-05 DIAGNOSIS — Z833 Family history of diabetes mellitus: Secondary | ICD-10-CM

## 2019-08-05 DIAGNOSIS — E113219 Type 2 diabetes mellitus with mild nonproliferative diabetic retinopathy with macular edema, unspecified eye: Secondary | ICD-10-CM | POA: Diagnosis present

## 2019-08-05 DIAGNOSIS — Z7982 Long term (current) use of aspirin: Secondary | ICD-10-CM

## 2019-08-05 DIAGNOSIS — E1140 Type 2 diabetes mellitus with diabetic neuropathy, unspecified: Secondary | ICD-10-CM | POA: Diagnosis present

## 2019-08-05 DIAGNOSIS — Z9089 Acquired absence of other organs: Secondary | ICD-10-CM

## 2019-08-05 DIAGNOSIS — N184 Chronic kidney disease, stage 4 (severe): Secondary | ICD-10-CM | POA: Diagnosis present

## 2019-08-05 DIAGNOSIS — Z9119 Patient's noncompliance with other medical treatment and regimen: Secondary | ICD-10-CM

## 2019-08-05 DIAGNOSIS — Z79891 Long term (current) use of opiate analgesic: Secondary | ICD-10-CM

## 2019-08-05 DIAGNOSIS — Z9851 Tubal ligation status: Secondary | ICD-10-CM

## 2019-08-05 DIAGNOSIS — I160 Hypertensive urgency: Secondary | ICD-10-CM

## 2019-08-05 DIAGNOSIS — E1165 Type 2 diabetes mellitus with hyperglycemia: Secondary | ICD-10-CM

## 2019-08-05 DIAGNOSIS — Z79899 Other long term (current) drug therapy: Secondary | ICD-10-CM

## 2019-08-05 DIAGNOSIS — E785 Hyperlipidemia, unspecified: Secondary | ICD-10-CM | POA: Diagnosis present

## 2019-08-05 DIAGNOSIS — Z794 Long term (current) use of insulin: Secondary | ICD-10-CM

## 2019-08-05 DIAGNOSIS — R0602 Shortness of breath: Secondary | ICD-10-CM | POA: Diagnosis not present

## 2019-08-05 DIAGNOSIS — Z87891 Personal history of nicotine dependence: Secondary | ICD-10-CM

## 2019-08-05 DIAGNOSIS — I509 Heart failure, unspecified: Secondary | ICD-10-CM

## 2019-08-05 DIAGNOSIS — Z20828 Contact with and (suspected) exposure to other viral communicable diseases: Secondary | ICD-10-CM | POA: Diagnosis present

## 2019-08-05 DIAGNOSIS — Z9049 Acquired absence of other specified parts of digestive tract: Secondary | ICD-10-CM

## 2019-08-05 DIAGNOSIS — Z91128 Patient's intentional underdosing of medication regimen for other reason: Secondary | ICD-10-CM

## 2019-08-05 DIAGNOSIS — Z91199 Patient's noncompliance with other medical treatment and regimen due to unspecified reason: Secondary | ICD-10-CM

## 2019-08-05 DIAGNOSIS — E1122 Type 2 diabetes mellitus with diabetic chronic kidney disease: Secondary | ICD-10-CM | POA: Diagnosis present

## 2019-08-05 DIAGNOSIS — E875 Hyperkalemia: Secondary | ICD-10-CM | POA: Diagnosis present

## 2019-08-05 DIAGNOSIS — N189 Chronic kidney disease, unspecified: Secondary | ICD-10-CM

## 2019-08-05 DIAGNOSIS — N179 Acute kidney failure, unspecified: Secondary | ICD-10-CM | POA: Diagnosis not present

## 2019-08-05 DIAGNOSIS — I161 Hypertensive emergency: Secondary | ICD-10-CM | POA: Diagnosis present

## 2019-08-05 DIAGNOSIS — T465X6A Underdosing of other antihypertensive drugs, initial encounter: Secondary | ICD-10-CM | POA: Diagnosis present

## 2019-08-05 DIAGNOSIS — I5033 Acute on chronic diastolic (congestive) heart failure: Secondary | ICD-10-CM | POA: Diagnosis present

## 2019-08-05 DIAGNOSIS — I130 Hypertensive heart and chronic kidney disease with heart failure and stage 1 through stage 4 chronic kidney disease, or unspecified chronic kidney disease: Secondary | ICD-10-CM | POA: Diagnosis present

## 2019-08-05 DIAGNOSIS — M25562 Pain in left knee: Secondary | ICD-10-CM | POA: Diagnosis present

## 2019-08-05 DIAGNOSIS — D631 Anemia in chronic kidney disease: Secondary | ICD-10-CM | POA: Diagnosis present

## 2019-08-05 DIAGNOSIS — G8929 Other chronic pain: Secondary | ICD-10-CM | POA: Diagnosis present

## 2019-08-05 DIAGNOSIS — Z8249 Family history of ischemic heart disease and other diseases of the circulatory system: Secondary | ICD-10-CM

## 2019-08-05 LAB — CBC WITH DIFFERENTIAL/PLATELET
Abs Immature Granulocytes: 0.03 10*3/uL (ref 0.00–0.07)
Basophils Absolute: 0 10*3/uL (ref 0.0–0.1)
Basophils Relative: 1 %
Eosinophils Absolute: 0.2 10*3/uL (ref 0.0–0.5)
Eosinophils Relative: 2 %
HCT: 30.1 % — ABNORMAL LOW (ref 36.0–46.0)
Hemoglobin: 9.4 g/dL — ABNORMAL LOW (ref 12.0–15.0)
Immature Granulocytes: 0 %
Lymphocytes Relative: 25 %
Lymphs Abs: 1.9 10*3/uL (ref 0.7–4.0)
MCH: 28.7 pg (ref 26.0–34.0)
MCHC: 31.2 g/dL (ref 30.0–36.0)
MCV: 91.8 fL (ref 80.0–100.0)
Monocytes Absolute: 0.4 10*3/uL (ref 0.1–1.0)
Monocytes Relative: 6 %
Neutro Abs: 4.8 10*3/uL (ref 1.7–7.7)
Neutrophils Relative %: 66 %
Platelets: 335 10*3/uL (ref 150–400)
RBC: 3.28 MIL/uL — ABNORMAL LOW (ref 3.87–5.11)
RDW: 13.2 % (ref 11.5–15.5)
WBC: 7.4 10*3/uL (ref 4.0–10.5)
nRBC: 0 % (ref 0.0–0.2)

## 2019-08-05 LAB — TROPONIN I (HIGH SENSITIVITY): Troponin I (High Sensitivity): 21 ng/L — ABNORMAL HIGH (ref <18)

## 2019-08-05 LAB — BASIC METABOLIC PANEL
Anion gap: 8 (ref 5–15)
BUN: 45 mg/dL — ABNORMAL HIGH (ref 8–23)
CO2: 18 mmol/L — ABNORMAL LOW (ref 22–32)
Calcium: 9.1 mg/dL (ref 8.9–10.3)
Chloride: 112 mmol/L — ABNORMAL HIGH (ref 98–111)
Creatinine, Ser: 3.26 mg/dL — ABNORMAL HIGH (ref 0.44–1.00)
GFR calc Af Amer: 16 mL/min — ABNORMAL LOW (ref >60)
GFR calc non Af Amer: 14 mL/min — ABNORMAL LOW (ref >60)
Glucose, Bld: 214 mg/dL — ABNORMAL HIGH (ref 70–99)
Potassium: 6.2 mmol/L — ABNORMAL HIGH (ref 3.5–5.1)
Sodium: 138 mmol/L (ref 135–145)

## 2019-08-05 LAB — CBG MONITORING, ED: Glucose-Capillary: 137 mg/dL — ABNORMAL HIGH (ref 70–99)

## 2019-08-05 LAB — BRAIN NATRIURETIC PEPTIDE: B Natriuretic Peptide: 471.4 pg/mL — ABNORMAL HIGH (ref 0.0–100.0)

## 2019-08-05 MED ORDER — HYDROCODONE-ACETAMINOPHEN 5-325 MG PO TABS
1.0000 | ORAL_TABLET | ORAL | Status: DC | PRN
Start: 2019-08-05 — End: 2019-08-11

## 2019-08-05 MED ORDER — FUROSEMIDE 10 MG/ML IJ SOLN
80.0000 mg | Freq: Once | INTRAMUSCULAR | Status: AC
  Administered 2019-08-05: 80 mg via INTRAVENOUS
  Filled 2019-08-05: qty 8

## 2019-08-05 MED ORDER — ROSUVASTATIN CALCIUM 20 MG PO TABS
20.0000 mg | ORAL_TABLET | Freq: Every day | ORAL | Status: DC
  Administered 2019-08-06 – 2019-08-11 (×6): 20 mg via ORAL
  Filled 2019-08-05 (×7): qty 1

## 2019-08-05 MED ORDER — PENTOXIFYLLINE ER 400 MG PO TBCR
400.0000 mg | EXTENDED_RELEASE_TABLET | Freq: Three times a day (TID) | ORAL | Status: DC
  Administered 2019-08-06 – 2019-08-11 (×17): 400 mg via ORAL
  Filled 2019-08-05 (×19): qty 1

## 2019-08-05 MED ORDER — AMLODIPINE BESYLATE 5 MG PO TABS: 5.0000 mg | ORAL_TABLET | Freq: Once | ORAL | Status: DC

## 2019-08-05 MED ORDER — CARVEDILOL 12.5 MG PO TABS
6.2500 mg | ORAL_TABLET | Freq: Two times a day (BID) | ORAL | Status: DC
  Administered 2019-08-05 – 2019-08-06 (×2): 6.25 mg via ORAL
  Filled 2019-08-05: qty 1

## 2019-08-05 MED ORDER — ASPIRIN EC 81 MG PO TBEC
81.0000 mg | DELAYED_RELEASE_TABLET | Freq: Every day | ORAL | Status: DC
  Administered 2019-08-06 – 2019-08-11 (×6): 81 mg via ORAL
  Filled 2019-08-05 (×7): qty 1

## 2019-08-05 MED ORDER — SODIUM CHLORIDE 0.9 % IV SOLN: 250.0000 mL | INTRAVENOUS | Status: DC | PRN

## 2019-08-05 MED ORDER — INSULIN ASPART 100 UNIT/ML ~~LOC~~ SOLN: 0.0000 [IU] | Freq: Every day | SUBCUTANEOUS | Status: DC

## 2019-08-05 MED ORDER — INSULIN ASPART 100 UNIT/ML ~~LOC~~ SOLN
0.0000 [IU] | Freq: Three times a day (TID) | SUBCUTANEOUS | Status: DC
  Administered 2019-08-06: 3 [IU] via SUBCUTANEOUS
  Administered 2019-08-06 – 2019-08-08 (×4): 2 [IU] via SUBCUTANEOUS
  Administered 2019-08-08: 3 [IU] via SUBCUTANEOUS
  Administered 2019-08-09: 5 [IU] via SUBCUTANEOUS
  Administered 2019-08-09: 3 [IU] via SUBCUTANEOUS
  Administered 2019-08-10 – 2019-08-11 (×3): 1 [IU] via SUBCUTANEOUS
  Administered 2019-08-11: 2 [IU] via SUBCUTANEOUS

## 2019-08-05 MED ORDER — ONDANSETRON HCL 4 MG/2ML IJ SOLN
4.0000 mg | Freq: Four times a day (QID) | INTRAMUSCULAR | Status: DC | PRN
  Administered 2019-08-06 – 2019-08-09 (×4): 4 mg via INTRAVENOUS
  Filled 2019-08-05 (×4): qty 2

## 2019-08-05 MED ORDER — AMLODIPINE BESYLATE 5 MG PO TABS
5.0000 mg | ORAL_TABLET | Freq: Every day | ORAL | Status: DC
  Administered 2019-08-06: 5 mg via ORAL
  Filled 2019-08-05: qty 1

## 2019-08-05 MED ORDER — SODIUM CHLORIDE 0.9% FLUSH
3.0000 mL | Freq: Two times a day (BID) | INTRAVENOUS | Status: DC
  Administered 2019-08-06 – 2019-08-10 (×5): 3 mL via INTRAVENOUS

## 2019-08-05 MED ORDER — FUROSEMIDE 10 MG/ML IJ SOLN
60.0000 mg | Freq: Two times a day (BID) | INTRAMUSCULAR | Status: DC
  Administered 2019-08-05 – 2019-08-06 (×2): 60 mg via INTRAVENOUS
  Filled 2019-08-05 (×2): qty 6

## 2019-08-05 MED ORDER — SODIUM CHLORIDE 0.9% FLUSH: 3.0000 mL | INTRAVENOUS | Status: DC | PRN

## 2019-08-05 MED ORDER — INSULIN DETEMIR 100 UNIT/ML ~~LOC~~ SOLN
30.0000 [IU] | Freq: Every day | SUBCUTANEOUS | Status: DC
  Administered 2019-08-06 – 2019-08-07 (×2): 30 [IU] via SUBCUTANEOUS
  Filled 2019-08-05 (×2): qty 0.3

## 2019-08-05 MED ORDER — HYDRALAZINE HCL 25 MG PO TABS: 50.0000 mg | ORAL_TABLET | Freq: Once | ORAL | Status: DC

## 2019-08-05 MED ORDER — ACETAMINOPHEN 325 MG PO TABS: 650.0000 mg | ORAL_TABLET | ORAL | Status: DC | PRN

## 2019-08-05 MED ORDER — NITROGLYCERIN IN D5W 200-5 MCG/ML-% IV SOLN
0.0000 ug/min | INTRAVENOUS | Status: DC
  Administered 2019-08-05: 5 ug/min via INTRAVENOUS
  Filled 2019-08-05: qty 250

## 2019-08-05 MED ORDER — ENOXAPARIN SODIUM 30 MG/0.3ML ~~LOC~~ SOLN
30.0000 mg | Freq: Every day | SUBCUTANEOUS | Status: DC
  Administered 2019-08-06: 30 mg via SUBCUTANEOUS
  Filled 2019-08-05: qty 0.3

## 2019-08-05 MED ORDER — SODIUM CHLORIDE 0.9% FLUSH
3.0000 mL | Freq: Two times a day (BID) | INTRAVENOUS | Status: DC
  Administered 2019-08-07 – 2019-08-11 (×6): 3 mL via INTRAVENOUS

## 2019-08-05 NOTE — ED Provider Notes (Addendum)
Care transferred to me.  Patient appears to have new onset heart failure.  Mild oxygen requirement.  She is also found to have acute kidney injury with mild hyperkalemia at 6.2.  No ECG changes noted.  She is being given furosemide for the fluid overload which should help with the potassium.  She will need telemetry monitoring and further work-up and treatment of renal failure and heart failure. IV nitroglycerin drip started for HTN. Dr. Lorin Mercy to admit.   Sherwood Gambler, MD 08/05/19 1624  4:29 PM Given dyspnea, AKI, cough, Dr. Lorin Mercy asks for rapid covid testing.  Melissa Howe was evaluated in Emergency Department on 08/05/2019 for the symptoms described in the history of present illness. She was evaluated in the context of the global COVID-19 pandemic, which necessitated consideration that the patient might be at risk for infection with the SARS-CoV-2 virus that causes COVID-19. Institutional protocols and algorithms that pertain to the evaluation of patients at risk for COVID-19 are in a state of rapid change based on information released by regulatory bodies including the CDC and federal and state organizations. These policies and algorithms were followed during the patient's care in the ED.     Sherwood Gambler, MD 08/05/19 1630

## 2019-08-05 NOTE — ED Triage Notes (Signed)
Pt called EMS for cough and shortness of breath. EMS reports room air sats 86%, 100% on 3L. Denies pain. Pt reports cough for several weeks.

## 2019-08-05 NOTE — ED Notes (Signed)
PT ambulated to the bathroom. Slight unsteadiness noted but no SOB, O2 at 100% on room air when returned to the room.
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
## 2019-08-05 NOTE — H&P (Signed)
History and Physical    Melissa Howe [id number] DOB: 09-11-1951 DOA: 08/05/2019  PCP: Javier Docker, MD Consultants:  Sharol Given - orthopedics Patient coming from:  Home - lives with daughter and granddaughter; NOK: Doralee Albino, daughter, [phone]  Chief Complaint: cough, SOB  HPI: Melissa Howe is a 68 y.o. female with medical history significant of HTN, HLD, and DM presenting with cough, SOB.  She reports about a month of cough, wheezing, and intermittent SOB.  She is noticing escalating SOB, although it does not appear to be clearly exertional.  Cough has been worse this week, and SOB was so bad this AM that she was not able to drive her granddaughter to an appointment.  +mild pedal edema.  No fever.     ED Course:   SOB, cough, apparently with new-onset CHF.  Acute on chronic CKD,  K+ 6.2, given Lasix 80 mg which should improve volume overload and elevated K+.  Marked HTN, given NTG drip.  Review of Systems: As per HPI; otherwise review of systems reviewed and negative.   Ambulatory Status:  Ambulates without assistance  Past Medical History:  Diagnosis Date  . Complication of anesthesia   . Constipation   . Diabetes mellitus    Type II  . Dyspnea   . Hyperlipemia   . Hypertension   . PONV (postoperative nausea and vomiting)     Past Surgical History:  Procedure Laterality Date  . APPENDECTOMY    . EYE SURGERY Bilateral    Cataract  . FOREIGN BODY REMOVAL Left 05/13/2018   Procedure: FOREIGN BODY REMOVAL left foot;  Surgeon: Meredith Pel, MD;  Location: [place];  Service: Orthopedics;  Laterality: Left;  . I&D EXTREMITY Left 05/21/2018   Procedure: LEFT FOOT DEBRIDEMENT AND WOUND CLOSURE;  Surgeon: Newt Minion, MD;  Location: [place];  Service: Orthopedics;  Laterality: Left;  . TONSILLECTOMY    . TUBAL LIGATION      Social History   Socioeconomic History  . Marital status: Divorced    Spouse name: Not on file  . Number of children: Not on  file  . Years of education: Not on file  . Highest education level: Not on file  Occupational History  . Occupation: Retired  Scientific laboratory technician  . Financial resource strain: Not on file  . Food insecurity    Worry: Not on file    Inability: Not on file  . Transportation needs    Medical: Not on file    Non-medical: Not on file  Tobacco Use  . Smoking status: Former Smoker    Years: 4.00    Types: Cigars    Quit date: 01/19/1984    Years since quitting: 35.5  . Smokeless tobacco: Never Used  Substance and Sexual Activity  . Alcohol use: No  . Drug use: No  . Sexual activity: Not on file  Lifestyle  . Physical activity    Days per week: Not on file    Minutes per session: Not on file  . Stress: Not on file  Relationships  . Social Herbalist on phone: Not on file    Gets together: Not on file    Attends religious service: Not on file    Active member of club or organization: Not on file    Attends meetings of clubs or organizations: Not on file    Relationship status: Not on file  . Intimate partner violence    Fear of current  or ex partner: Not on file    Emotionally abused: Not on file    Physically abused: Not on file    Forced sexual activity: Not on file  Other Topics Concern  . Not on file  Social History Narrative   Lives in [place] with her dtr.  Previously worked in a [place] but has been out on disability since 2009.  Recently moved from disability to "retired" when she turned 2.  Sedentary.  Knits frequently for fun.    No Known Allergies  Family History  Problem Relation Age of Onset  . Hypertension Mother   . Diabetes Mother   . Hypertension Sister     Prior to Admission medications   Medication Sig Start Date End Date Taking? Authorizing Provider  amLODipine (NORVASC) 5 MG tablet Take 1 tablet (5 mg total) by mouth daily. 09/08/16   Orson Eva, MD  aspirin EC 81 MG tablet Take 1 tablet (81 mg total) by mouth daily. 09/08/16   Orson Eva, MD   carvedilol (COREG) 6.25 MG tablet Take 1 tablet (6.25 mg total) by mouth 2 (two) times daily with a meal. 09/08/16   Tat, Shanon Brow, MD  HYDROcodone-acetaminophen (NORCO/VICODIN) 5-325 MG tablet Take 1-2 tablets by mouth every 4 (four) hours as needed for moderate pain (pain score 4-6). 05/14/18   Danford, Suann Larry, MD  Insulin Detemir (LEVEMIR) 100 UNIT/ML Pen Inject 30 Units into the skin daily with breakfast. 09/09/16   Tat, Shanon Brow, MD  Insulin Pen Needle 32G X 4 MM MISC Use with insulin pen to dispense insulin as directed 09/08/16   Tat, Shanon Brow, MD  pentoxifylline (TRENTAL) 400 MG CR tablet Take 1 tablet (400 mg total) by mouth 3 (three) times daily with meals. 06/10/18   Newt Minion, MD  rosuvastatin (CRESTOR) 20 MG tablet Take 1 tablet (20 mg total) by mouth daily. 09/08/16   Orson Eva, MD    Physical Exam: Vitals:   08/05/19 1615 08/05/19 1630 08/05/19 1700 08/05/19 1715  BP: (!) 201/88 (!) 201/101 (!) 192/92 (!) 196/90  Pulse: 80 74 72 74  Resp: 20 19 10 10   Temp:      TempSrc:      SpO2: 100% 100% 100% 100%  Weight:      Height:         . General:  Appears calm and comfortable and is NAD, on RA . Eyes:  PERRL, EOMI, normal lids, iris . ENT:  grossly normal hearing, lips & tongue, mmm . Neck:  no LAD, masses or thyromegaly . Cardiovascular:  RRR, no m/r/g. Trace LE edema.  Marland Kitchen Respiratory:  Scant L > R bibasilar crackles.  Normal respiratory effort. . Abdomen:  soft, NT, ND, NABS . Back:   normal alignment, no CVAT . Skin:  no rash or induration seen on limited exam . Musculoskeletal:  grossly normal tone BUE/BLE, good ROM, no bony abnormality . Psychiatric:  grossly normal mood and affect, speech fluent and appropriate, AOx3 . Neurologic:  CN 2-12 grossly intact, moves all extremities in coordinated fashion, sensation intact    Radiological Exams on Admission: Dg Chest Portable 1 View  Result Date: 08/05/2019 CLINICAL DATA:  Chest pain and shortness of breath for 1-2  weeks EXAM: PORTABLE CHEST 1 VIEW COMPARISON:  Chest radiograph dated 02/14/2018 FINDINGS: The heart is borderline enlarged. Mild to moderate diffuse bilateral interstitial opacities are noted. There is no pleural effusion or pneumothorax. The osseous structures are unremarkable. IMPRESSION: Mild to moderate diffuse bilateral interstitial  opacities. This could represent interstitial edema or atypical infection. Electronically Signed   By: Zerita Boers M.D.   On: 08/05/2019 12:46    EKG: Independently reviewed.  NSR with rate 82; LVH with no evidence of acute ischemia   Labs on Admission: I have personally reviewed the available labs and imaging studies at the time of the admission.  Pertinent labs:   K+ 6.2 Glucose 112 CO2 18 Glucose 214 BUN 45/Creatinine 3.26/GFR 16; baseline creatinine 1.8-2.1, GFR 25-30 BNP 471.4 HS troponin 21 WBC 7.4 Hgb 9.4   Assessment/Plan Principal Problem:   Dyspnea Active Problems:   Medically noncompliant   Uncontrolled type 2 diabetes mellitus with hyperglycemia, with long-term current use of insulin (HCC)   Hypertensive urgency   Chronic diastolic CHF (congestive heart failure) (HCC)   Acute kidney injury superimposed on chronic kidney disease (HCC)   Dyspnea -Patient with prior h/o chronic diastolic CHF presenting with 1 month of SOB, wheezing, cough -CXR with moderate to severe interstitial opacities - edema vs. Atypical infection  -While this is likely related to CHF exacerbation, her symptoms could also be c/w COVID-19 infection and so evaluation for this is important -COVID testing is pending, but I was unable to perform rapid testing in this patient due to unavailability of supplies and so her Aptima test is pending -She will remain a PUI until COVID testing is negative  Chronic diastolic CHF, likely with acute exacerbation -Patient presenting with worsening SOB and cough but without hypoxia -CXR with B moderate to severe interstitial  opacities which are nonspecific -Normal WBC count, negative lactate; will not give additional antibiotics at this time -Elevated BNP, 471.4 as compared to 267.5 in 01/2018 -Will place in observation status with telemetry, as there are no current findings necessitating admission (hemodynamic instability, severe electrolyte abnormalities, cardiac arrhythmias, ACS, severe pulmonary edema requiring new O2 therapy, AMS) -Echo in 11/17 with preserved EF and grade 1 diastolic dysfunction; will repeat -Will continue ASA -No ACE due to renal dysfunction -Continue Coreg -CHF order set utilized; may need CHF team consult but will hold until Echo results are available -Was given Lasix 80 mg x 1 in ER and will repeat with 60 mg IV BID for now -Currently does not need [place] O2 -Repeat EKG in AM  Hypertensive urgency -Patient is supposed to take Coreg, Norvasc at home -She acknowledges medication non-compliance and chronically poorly controlled HTN -It is possible that symptoms are reflective of HTN crisis, but she may also simply have the symptoms due to chronic non-CPL -She was started on a NTG drip and this is continued for now  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 -Baseline GFR is about 25-30, currently 16 -This may be due to progressive renal dysfunction; volume depletion in the setting of COVID infection; or HTN crisis -However, it appears to currently be more related to diminished renal perfusion in the setting of CHF -Will diurese and follow for now  HLD -Continue Crestor -Lipids were checked in 2/20 (TC 165, HDL 58, LDL 82, TG 126) so will not repeat at this time  DM -Last A1c was 11.6 in 7/19 -Recheck A1c -Continue Levemir -Will cover with sensitive-scale SSI     Note: This patient has been tested and is pending for the novel coronavirus COVID-19.   DVT prophylaxis: Lovenox  Code Status:  FULL - confirmed with patient Family Communication: None present Disposition Plan:  Home once clinically improved  Consults called: CM/PT Admission status: It is my clinical opinion that referral for  OBSERVATION is reasonable and necessary in this patient based on the above information provided. The aforementioned taken together are felt to place the patient at high risk for further clinical deterioration. However it is anticipated that the patient may be medically stable for discharge from the hospital within 24 to 48 hours.      Karmen Bongo MD [facility]   How to contact the [facility] Attending or Consulting provider [place] or covering provider during after hours [place], for this patient?  1. Check the care team in [facility] and look for a) attending/consulting [facility] provider listed and b) the [facility] team listed 2. Log into [email or web address] and use [place]'s universal password to access. If you do not have the password, please contact the hospital operator. 3. Locate the [facility] provider you are looking for under [facility] and page to a number that you can be directly reached. 4. If you still have difficulty reaching the provider, please page the [facility] (Director on Call) for the Hospitalists listed on amion for assistance.   08/05/2019, 6:09 PM

## 2019-08-06 ENCOUNTER — Encounter (HOSPITAL_COMMUNITY): Payer: Self-pay

## 2019-08-06 ENCOUNTER — Observation Stay (HOSPITAL_COMMUNITY): Payer: Medicare Other

## 2019-08-06 DIAGNOSIS — Z8249 Family history of ischemic heart disease and other diseases of the circulatory system: Secondary | ICD-10-CM | POA: Diagnosis not present

## 2019-08-06 DIAGNOSIS — G8929 Other chronic pain: Secondary | ICD-10-CM | POA: Diagnosis present

## 2019-08-06 DIAGNOSIS — N184 Chronic kidney disease, stage 4 (severe): Secondary | ICD-10-CM | POA: Diagnosis present

## 2019-08-06 DIAGNOSIS — R0602 Shortness of breath: Secondary | ICD-10-CM | POA: Diagnosis not present

## 2019-08-06 DIAGNOSIS — E1165 Type 2 diabetes mellitus with hyperglycemia: Secondary | ICD-10-CM | POA: Diagnosis present

## 2019-08-06 DIAGNOSIS — I361 Nonrheumatic tricuspid (valve) insufficiency: Secondary | ICD-10-CM | POA: Diagnosis not present

## 2019-08-06 DIAGNOSIS — I161 Hypertensive emergency: Secondary | ICD-10-CM | POA: Diagnosis present

## 2019-08-06 DIAGNOSIS — Z87891 Personal history of nicotine dependence: Secondary | ICD-10-CM | POA: Diagnosis not present

## 2019-08-06 DIAGNOSIS — Z833 Family history of diabetes mellitus: Secondary | ICD-10-CM | POA: Diagnosis not present

## 2019-08-06 DIAGNOSIS — N189 Chronic kidney disease, unspecified: Secondary | ICD-10-CM | POA: Diagnosis not present

## 2019-08-06 DIAGNOSIS — D631 Anemia in chronic kidney disease: Secondary | ICD-10-CM | POA: Diagnosis present

## 2019-08-06 DIAGNOSIS — N179 Acute kidney failure, unspecified: Secondary | ICD-10-CM | POA: Diagnosis present

## 2019-08-06 DIAGNOSIS — E113219 Type 2 diabetes mellitus with mild nonproliferative diabetic retinopathy with macular edema, unspecified eye: Secondary | ICD-10-CM | POA: Diagnosis present

## 2019-08-06 DIAGNOSIS — I5033 Acute on chronic diastolic (congestive) heart failure: Secondary | ICD-10-CM | POA: Diagnosis present

## 2019-08-06 DIAGNOSIS — E785 Hyperlipidemia, unspecified: Secondary | ICD-10-CM | POA: Diagnosis present

## 2019-08-06 DIAGNOSIS — I160 Hypertensive urgency: Secondary | ICD-10-CM | POA: Diagnosis not present

## 2019-08-06 DIAGNOSIS — Z9851 Tubal ligation status: Secondary | ICD-10-CM | POA: Diagnosis not present

## 2019-08-06 DIAGNOSIS — Z9089 Acquired absence of other organs: Secondary | ICD-10-CM | POA: Diagnosis not present

## 2019-08-06 DIAGNOSIS — E875 Hyperkalemia: Secondary | ICD-10-CM | POA: Diagnosis present

## 2019-08-06 DIAGNOSIS — Z79891 Long term (current) use of opiate analgesic: Secondary | ICD-10-CM | POA: Diagnosis not present

## 2019-08-06 DIAGNOSIS — E1140 Type 2 diabetes mellitus with diabetic neuropathy, unspecified: Secondary | ICD-10-CM | POA: Diagnosis present

## 2019-08-06 DIAGNOSIS — R0600 Dyspnea, unspecified: Secondary | ICD-10-CM | POA: Diagnosis present

## 2019-08-06 DIAGNOSIS — M25562 Pain in left knee: Secondary | ICD-10-CM | POA: Diagnosis present

## 2019-08-06 DIAGNOSIS — E1122 Type 2 diabetes mellitus with diabetic chronic kidney disease: Secondary | ICD-10-CM | POA: Diagnosis present

## 2019-08-06 DIAGNOSIS — Z91128 Patient's intentional underdosing of medication regimen for other reason: Secondary | ICD-10-CM | POA: Diagnosis not present

## 2019-08-06 DIAGNOSIS — I5032 Chronic diastolic (congestive) heart failure: Secondary | ICD-10-CM | POA: Diagnosis not present

## 2019-08-06 DIAGNOSIS — I130 Hypertensive heart and chronic kidney disease with heart failure and stage 1 through stage 4 chronic kidney disease, or unspecified chronic kidney disease: Secondary | ICD-10-CM | POA: Diagnosis present

## 2019-08-06 DIAGNOSIS — Z9049 Acquired absence of other specified parts of digestive tract: Secondary | ICD-10-CM | POA: Diagnosis not present

## 2019-08-06 DIAGNOSIS — Z20828 Contact with and (suspected) exposure to other viral communicable diseases: Secondary | ICD-10-CM | POA: Diagnosis present

## 2019-08-06 DIAGNOSIS — T465X6A Underdosing of other antihypertensive drugs, initial encounter: Secondary | ICD-10-CM | POA: Diagnosis present

## 2019-08-06 LAB — CBC WITH DIFFERENTIAL/PLATELET
Abs Immature Granulocytes: 0.02 10*3/uL (ref 0.00–0.07)
Basophils Absolute: 0 10*3/uL (ref 0.0–0.1)
Basophils Relative: 0 %
Eosinophils Absolute: 0.2 10*3/uL (ref 0.0–0.5)
Eosinophils Relative: 3 %
HCT: 26.7 % — ABNORMAL LOW (ref 36.0–46.0)
Hemoglobin: 8.7 g/dL — ABNORMAL LOW (ref 12.0–15.0)
Immature Granulocytes: 0 %
Lymphocytes Relative: 28 %
Lymphs Abs: 2 10*3/uL (ref 0.7–4.0)
MCH: 29.5 pg (ref 26.0–34.0)
MCHC: 32.6 g/dL (ref 30.0–36.0)
MCV: 90.5 fL (ref 80.0–100.0)
Monocytes Absolute: 0.6 10*3/uL (ref 0.1–1.0)
Monocytes Relative: 8 %
Neutro Abs: 4.2 10*3/uL (ref 1.7–7.7)
Neutrophils Relative %: 61 %
Platelets: 304 10*3/uL (ref 150–400)
RBC: 2.95 MIL/uL — ABNORMAL LOW (ref 3.87–5.11)
RDW: 13.1 % (ref 11.5–15.5)
WBC: 7.1 10*3/uL (ref 4.0–10.5)
nRBC: 0 % (ref 0.0–0.2)

## 2019-08-06 LAB — ECHOCARDIOGRAM COMPLETE
Height: 64 in
Weight: 2719.59 oz

## 2019-08-06 LAB — CBG MONITORING, ED: Glucose-Capillary: 246 mg/dL — ABNORMAL HIGH (ref 70–99)

## 2019-08-06 LAB — BASIC METABOLIC PANEL
Anion gap: 9 (ref 5–15)
BUN: 51 mg/dL — ABNORMAL HIGH (ref 8–23)
CO2: 20 mmol/L — ABNORMAL LOW (ref 22–32)
Calcium: 8.9 mg/dL (ref 8.9–10.3)
Chloride: 109 mmol/L (ref 98–111)
Creatinine, Ser: 3.66 mg/dL — ABNORMAL HIGH (ref 0.44–1.00)
GFR calc Af Amer: 14 mL/min — ABNORMAL LOW (ref >60)
GFR calc non Af Amer: 12 mL/min — ABNORMAL LOW (ref >60)
Glucose, Bld: 252 mg/dL — ABNORMAL HIGH (ref 70–99)
Potassium: 5.8 mmol/L — ABNORMAL HIGH (ref 3.5–5.1)
Sodium: 138 mmol/L (ref 135–145)

## 2019-08-06 LAB — GLUCOSE, CAPILLARY
Glucose-Capillary: 173 mg/dL — ABNORMAL HIGH (ref 70–99)
Glucose-Capillary: 82 mg/dL (ref 70–99)
Glucose-Capillary: 99 mg/dL (ref 70–99)

## 2019-08-06 LAB — HIV ANTIBODY (ROUTINE TESTING W REFLEX): HIV Screen 4th Generation wRfx: NONREACTIVE

## 2019-08-06 LAB — SARS CORONAVIRUS 2 (TAT 6-24 HRS): SARS Coronavirus 2: NEGATIVE

## 2019-08-06 LAB — HEMOGLOBIN A1C
Hgb A1c MFr Bld: 9.3 % — ABNORMAL HIGH (ref 4.8–5.6)
Mean Plasma Glucose: 220.21 mg/dL

## 2019-08-06 MED ORDER — HYDRALAZINE HCL 25 MG PO TABS
25.0000 mg | ORAL_TABLET | Freq: Three times a day (TID) | ORAL | Status: DC
  Administered 2019-08-06 – 2019-08-11 (×14): 25 mg via ORAL
  Filled 2019-08-06 (×15): qty 1

## 2019-08-06 MED ORDER — CARVEDILOL 12.5 MG PO TABS
12.5000 mg | ORAL_TABLET | Freq: Two times a day (BID) | ORAL | Status: DC
  Administered 2019-08-06 – 2019-08-11 (×10): 12.5 mg via ORAL
  Filled 2019-08-06 (×11): qty 1

## 2019-08-06 MED ORDER — AMLODIPINE BESYLATE 10 MG PO TABS
10.0000 mg | ORAL_TABLET | Freq: Every day | ORAL | Status: DC
  Administered 2019-08-07 – 2019-08-11 (×5): 10 mg via ORAL
  Filled 2019-08-06 (×6): qty 1

## 2019-08-06 MED ORDER — HYDRALAZINE HCL 20 MG/ML IJ SOLN
10.0000 mg | Freq: Four times a day (QID) | INTRAMUSCULAR | Status: DC | PRN
  Administered 2019-08-06: 10 mg via INTRAVENOUS
  Filled 2019-08-06: qty 1

## 2019-08-06 NOTE — Progress Notes (Signed)
PROGRESS NOTE  Melissa Howe  DOB: 10-22-1951  PCP: Javier Docker, MD SV:[id number]  DOA: 08/05/2019  LOS: 0 days   Brief narrative: Patient is a 67 y.o. female with medical history significant of HTN, HLD, and DM who lives at home and cares for her mother with dementia.  She presented to the ED on 10/9 with complaint of shortness of breath and cough.  She had intermittent episodes of cough, wheezing and shortness of breath for last 1 month which would resolve on their own.  10/9, patient woke up with shortness of breath and it did not improve.  She also noted mild pedal edema.  Patient states she frequently misses her medications because of 'family stress.'  In the ED,no fever, blood pressure was elevated over 200 and spiked up to a maximum of 227/90.  At rest, patient is maintaining oxygen saturation more than 95% room air. Blood work showed sodium 138, potassium elevated to 6.2, BUN/creatinine 45/3.26, BNP 471, troponin slightly elevated to 21.  WBC count normal 7.4, hemoglobin 9.4. COVID-19 test negative. Chest x-ray showed mild to moderate diffuse bilateral interstitial opacities representative of interstitial edema or atypical infection. EKG showed normal sinus rhythm at 82 bpm, no ST-T wave changes, voltage criteria for LVH. Because of persistently elevated blood pressure, patient was given a dose of Lasix 80 mg IV and was started on nitroglycerin drip.  Subjective: Patient was seen and examined this morning in the ED.  Still waiting for bed. Not on oxygen supplementation.  No chest pain.  I didnot see any pedal edema. Continues to have mild bibasilar crackles.  Assessment/Plan: Acute hypertensive emergency -Presented with shortness of breath, chest discomfort.  Blood pressure elevated up to 227/90. -Patient was given a dose of Lasix 80 mg IV and nitroglycerin drip.  Also continued on Lasix 60 mg IV twice daily. -Home meds include Coreg 6.25 mg twice daily, chlorthalidone  25 mg daily and Norvasc 5 mg daily. Both have been resumed. -I will increase the dose of Coreg to 12.5 mg daily and Norvasc to 10 mg daily.  I will add hydralazine 25 mg 3 times daily.   -Stop IV Lasix because of worsening creatinine. -Titrate down nitroglycerin drip.  Acute exacerbation of diastolic congestive heart failure Acute pulmonary edema -Echocardiogram from 2017 showed preserved EF and grade 1 diastolic dysfunction. -Presented with shortness of breath.  Chest x-ray findings likely secondary to pulmonary edema.   -CHF was precipitated likely because of hypertensive emergency -Not on diuretics at home. -Symptoms resolved with Lasix IV 80 mg given in the ED.  Unable to continue Lasix for now because of AKI.  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  -Creatinine on admission 3.26, worsened to 3.66 today. -If no improvement by tomorrow, will call nephrology consultation.  Diabetes mellitus type 2 -A1c 9.3 -Home meds include Levemir 30 units twice a day. -Currently on Levemir 30 units daily and sliding scale insulin. -Continue Accu-Cheks.  Needs adjustment with change in creatinine clearance.  Hyperlipidemia -Continue Crestor and aspirin. -Lipids were checked in 2/20 (TC 165, HDL 58, LDL 82, TG 126).  Mobility: Encourage ambulation DVT prophylaxis:  Stop Lovenox because of worsening creatinine clearance.  Start SCD boots Code Status:   Code Status: Full Code  Family Communication: Expected Discharge:  Patient remains on nitroglycerin drip, being weaned off.  Has significant acute kidney injury.  Medicines being adjusted.  Needs 2-3 more days of hospital stay.  I decided  to switch the patient to inpatient.  Consultants:  None  Procedures:  None  Antimicrobials: Anti-infectives (From admission, onward)   None      Diet Order            Diet heart healthy/carb modified Room service appropriate? Yes; Fluid consistency: Thin  Diet  effective now              Infusions:  . sodium chloride    . nitroGLYCERIN Stopped (08/06/19 1128)    Scheduled Meds: . amLODipine  5 mg Oral Daily  . aspirin EC  81 mg Oral Daily  . carvedilol  6.25 mg Oral BID WC  . enoxaparin (LOVENOX) injection  30 mg Subcutaneous Daily  . furosemide  60 mg Intravenous Q12H  . insulin aspart  0-5 Units Subcutaneous QHS  . insulin aspart  0-9 Units Subcutaneous TID WC  . insulin detemir  30 Units Subcutaneous Daily  . pentoxifylline  400 mg Oral TID WC  . rosuvastatin  20 mg Oral Daily  . sodium chloride flush  3 mL Intravenous Q12H  . sodium chloride flush  3 mL Intravenous Q12H    PRN meds: sodium chloride, acetaminophen, HYDROcodone-acetaminophen, ondansetron (ZOFRAN) IV, sodium chloride flush   Objective: Vitals:   08/06/19 1000 08/06/19 1100  BP: (!) 154/80 (!) 172/86  Pulse: 79 77  Resp: 18 16  Temp:    SpO2: 98% 96%   No intake or output data in the 24 hours ending 08/06/19 1236 Filed Weights   08/05/19 1133  Weight: 77.1 kg   Weight change:  Body mass index is 29.18 kg/m.   Physical Exam: General exam: Appears calm and comfortable.  Skin: No rashes, lesions or ulcers. HEENT: Atraumatic, normocephalic, supple neck, no obvious bleeding Lungs: Bibasilar crackles CVS: Regular rate and rhythm, no murmur GI/Abd soft, nontender, nondistended bowel sound present CNS: Alert, awake, oriented x3 Psychiatry: Mood appropriate Extremities: No pedal edema, no calf tenderness  Data Review: I have personally reviewed the laboratory data and studies available.  Recent Labs  Lab 08/05/19 1444 08/06/19 0041  WBC 7.4 7.1  NEUTROABS 4.8 4.2  HGB 9.4* 8.7*  HCT 30.1* 26.7*  MCV 91.8 90.5  PLT 335 304   Recent Labs  Lab 08/05/19 1444 08/06/19 0354  NA 138 138  K 6.2* 5.8*  CL 112* 109  CO2 18* 20*  GLUCOSE 214* 252*  BUN 45* 51*  CREATININE 3.26* 3.66*  CALCIUM 9.1 8.9    Terrilee Croak, MD  [facility]  08/06/2019

## 2019-08-06 NOTE — Progress Notes (Signed)
Pt was assessed for PT and noted her safety was fairly good with hallway gait, note minor strength change of hips.  Pt is appropriate for PT to see her here and dc to home with no follow up therapy given her reasonable performance of all movement with controlled pulses and O2 sats.  See acutely for strength of hips and to try stairs if time permits before DC.   08/06/19 1300  PT Visit Information  Last PT Received On 08/06/19  Assistance Needed +1  History of Present Illness 68 yo female with onset of cough and SOB over time for weeks was brought to hosp, note her subacute CHF and lung opacities, lung edema, CKD and AKI.  Covid (-).  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  PMHx:  HTN, DM, CKD, HLD, CHF  Precautions  Precautions Fall  Precaution Comments monitor vitals on telemetry  Restrictions  Weight Bearing Restrictions No  Home Living  Family/patient expects to be discharged to: Private residence  Living Arrangements Alone;Children  Available Help at Discharge Family;Available 24 hours/day  Type of Home House  Home Access Level entry  Home Layout Two level  Alternate Level Stairs-Number of Steps 13  Alternate Level Stairs-Rails Left  Bathroom Toilet Standard  Home Equipment None  Additional Comments has been independent for all gait  Prior Function  Level of Independence Independent  Communication  Communication No difficulties  Pain Assessment  Pain Assessment No/denies pain  Cognition  Arousal/Alertness Awake/alert  Behavior During Therapy WFL for tasks assessed/performed  Overall Cognitive Status Within Functional Limits for tasks assessed  Upper Extremity Assessment  Upper Extremity Assessment Overall WFL for tasks assessed  Lower Extremity Assessment  Lower Extremity Assessment Overall WFL for tasks assessed (hips 4+ strength)  Cervical / Trunk Assessment  Cervical / Trunk Assessment Normal  Bed Mobility  Overal bed  mobility Needs Assistance  Bed Mobility Supine to Sit;Sit to Supine  Supine to sit Min assist  Sit to supine Min guard  General bed mobility comments min assist to support trunk up  Transfers  Overall transfer level Needs assistance  Equipment used None  Transfers Sit to/from Stand  Sit to Stand Min guard  General transfer comment min guard for safety  Ambulation/Gait  Ambulation/Gait assistance Min guard  Gait Distance (Feet) 250 Feet  Assistive device 1 person hand held assist  Gait Pattern/deviations Step-through pattern;Decreased stride length;Wide base of support  General Gait Details pt is walking with short even steps but taking her time due to some level of fatigue.  MM strength is WFL x hips 4+.    Gait velocity controlled  Gait velocity interpretation <1.31 ft/sec, indicative of household ambulator  Balance  Overall balance assessment Modified Independent  General Comments  General comments (skin integrity, edema, etc.) pt is walking on gait belt for safety with no LOB and controlled pace, RR is elevated at times but sats and pulses are well [facility]  Exercises  Exercises Other exercises (strength is 4+ hips and WFL LE's otherwise)  PT - End of Session  Equipment Utilized During Treatment Gait belt  Activity Tolerance Patient tolerated treatment well  Patient left in bed;with call bell/phone within reach  Nurse Communication Mobility status  PT Assessment  PT Recommendation/Assessment Patient needs continued PT services  PT Visit Diagnosis Muscle weakness (generalized) (M62.81)  PT Problem List Decreased strength;Decreased range of motion;Decreased activity tolerance;Decreased mobility  Barriers to Discharge Inaccessible home environment  Barriers  to Discharge Comments home with stairs to upstairs of house  PT Plan  PT Frequency (ACUTE ONLY) Min 3X/week  PT Treatment/Interventions (ACUTE ONLY) DME instruction;Gait training;Stair training;Functional mobility  training;Therapeutic activities;Therapeutic exercise;Balance training;Neuromuscular re-education;Patient/family education  AM-PAC PT "6 Clicks" Mobility Outcome Measure (Version 2)  Help needed turning from your back to your side while in a flat bed without using bedrails? 4  Help needed moving from lying on your back to sitting on the side of a flat bed without using bedrails? 4  Help needed moving to and from a bed to a chair (including a wheelchair)? 4  Help needed standing up from a chair using your arms (e.g., wheelchair or bedside chair)? 3  Help needed to walk in hospital room? 3  Help needed climbing 3-5 steps with a railing?  3  6 Click Score 21  Consider Recommendation of Discharge To: Home with no services  PT Recommendation  Follow Up Recommendations No PT follow up  PT equipment None recommended by PT  Individuals Consulted  Consulted and Agree with Results and Recommendations Patient  Acute Rehab PT Goals  Patient Stated Goal to get home when she is feeling better  PT Goal Formulation With patient  Time For Goal Achievement 08/13/19  Potential to Achieve Goals Good  PT Time Calculation  PT Start Time (ACUTE ONLY) 1220  PT Stop Time (ACUTE ONLY) 1243  PT Time Calculation (min) (ACUTE ONLY) 23 min  PT General Charges  $$ ACUTE PT VISIT 1 Visit  PT Evaluation  $PT Eval Moderate Complexity 1 Mod  PT Treatments  $Gait Training 8-22 mins  Written Expression  Dominant Hand Right    Mee Hives, PT MS Acute Rehab Dept. Number: [place]

## 2019-08-06 NOTE — ED Notes (Signed)
Heart healthy/carb modified lunch tray ordered 

## 2019-08-06 NOTE — Progress Notes (Signed)
Pt BP reading showed 171/78 at 2025. PRN hydralazine 10mg  was given and effective. BP came down to 143/79 at 2145. Will monitor.     Robin Searing 08/06/19

## 2019-08-06 NOTE — Progress Notes (Signed)
Echocardiogram 2D Echocardiogram has been performed.  Melissa Howe 08/06/2019, 9:38 AM

## 2019-08-07 LAB — BASIC METABOLIC PANEL
Anion gap: 15 (ref 5–15)
BUN: 51 mg/dL — ABNORMAL HIGH (ref 8–23)
CO2: 14 mmol/L — ABNORMAL LOW (ref 22–32)
Calcium: 8.3 mg/dL — ABNORMAL LOW (ref 8.9–10.3)
Chloride: 107 mmol/L (ref 98–111)
Creatinine, Ser: 4.18 mg/dL — ABNORMAL HIGH (ref 0.44–1.00)
GFR calc Af Amer: 12 mL/min — ABNORMAL LOW (ref >60)
GFR calc non Af Amer: 10 mL/min — ABNORMAL LOW (ref >60)
Glucose, Bld: 221 mg/dL — ABNORMAL HIGH (ref 70–99)
Potassium: 5.3 mmol/L — ABNORMAL HIGH (ref 3.5–5.1)
Sodium: 136 mmol/L (ref 135–145)

## 2019-08-07 LAB — GLUCOSE, CAPILLARY
Glucose-Capillary: 91 mg/dL (ref 70–99)
Glucose-Capillary: 167 mg/dL — ABNORMAL HIGH (ref 70–99)
Glucose-Capillary: 180 mg/dL — ABNORMAL HIGH (ref 70–99)
Glucose-Capillary: 161 mg/dL — ABNORMAL HIGH (ref 70–99)

## 2019-08-07 LAB — TROPONIN I (HIGH SENSITIVITY): Troponin I (High Sensitivity): 12 ng/L (ref <18)

## 2019-08-07 MED ORDER — DARBEPOETIN ALFA 100 MCG/0.5ML IJ SOSY
100.0000 ug | PREFILLED_SYRINGE | INTRAMUSCULAR | Status: DC
  Administered 2019-08-07: 100 ug via SUBCUTANEOUS
  Filled 2019-08-07: qty 0.5

## 2019-08-07 MED ORDER — INSULIN DETEMIR 100 UNIT/ML ~~LOC~~ SOLN
35.0000 [IU] | Freq: Every day | SUBCUTANEOUS | Status: DC
  Administered 2019-08-08: 35 [IU] via SUBCUTANEOUS
  Filled 2019-08-07 (×2): qty 0.35

## 2019-08-07 NOTE — Consult Note (Signed)
[facility] Renal Consultation Note  Requesting MD: Dahal Indication for Consultation: A on CRF  HPI:  Melissa Howe is a 68 y.o. female with past medical history significant for diabetes mellitus, hypertension who presented to medical attention on 10/9 with complaints of shortness of breath and blood pressure was noted to be very elevated at 225/95.  Also upon presentation creatinine was 3.26 and potassium was 6.2.  She was given Lasix.  Not much urine output is recorded.-Creatinine worsened to 3.66 yesterday and 4.18 today and that is the reason for consultation.  Blood pressure was corrected relatively quickly, there is 1 blood pressure in the system with [id number] systolic.  However the ones since have been higher.  700 cc of urine recorded for overnight shift.  Regarding her renal function, patient is followed at [facility] Dr. Royce Macadamia.  Unfortunately I do not have access to our records this weekend.  However, the patient stated that the kidney doctor told her that her GFR was 15 and if she did not get her blood pressure under control kidney function would worsen.  She admits that she has not been taking her blood pressure medications.  The labs I do have access to are from 2019 when creatinine was around 2.  When she presented this time creatinine was 3.26, GFR of 16-still actually probably similar to where she has been recently.  However, with blood pressure control creatinine has worsened and is at a level of 4.18 today, GFR of 12.  She had hyperkalemia originally but that actually has improved.  She denies any NSAID use prior to admission.  She does admit to some nausea this morning  Creatinine, Ser  Date/Time Value Ref Range Status  08/07/2019 09:09 AM 4.18 (H) 0.44 - 1.00 mg/dL Final  08/06/2019 03:54 AM 3.66 (H) 0.44 - 1.00 mg/dL Final  08/05/2019 02:44 PM 3.26 (H) 0.44 - 1.00 mg/dL Final  05/21/2018 07:05 AM 1.79 (H) 0.44 - 1.00 mg/dL Final  05/14/2018  04:29 AM 2.20 (H) 0.44 - 1.00 mg/dL Final  05/12/2018 05:31 PM 2.09 (H) 0.44 - 1.00 mg/dL Final  05/04/2018 05:04 PM 1.85 (H) 0.44 - 1.00 mg/dL Final  02/17/2018 05:21 AM 1.65 (H) 0.44 - 1.00 mg/dL Final  02/16/2018 05:40 AM 1.82 (H) 0.44 - 1.00 mg/dL Final  02/15/2018 05:55 AM 2.47 (H) 0.44 - 1.00 mg/dL Final  02/14/2018 05:35 AM 2.97 (H) 0.44 - 1.00 mg/dL Final  02/13/2018 05:36 AM 2.57 (H) 0.44 - 1.00 mg/dL Final  02/11/2018 11:07 PM 1.73 (H) 0.44 - 1.00 mg/dL Final  09/08/2016 03:00 AM 1.10 (H) 0.44 - 1.00 mg/dL Final  09/07/2016 03:38 AM 1.30 (H) 0.44 - 1.00 mg/dL Final  09/06/2016 05:35 PM 1.48 (H) 0.44 - 1.00 mg/dL Final  07/14/2015 06:04 AM 0.96 0.44 - 1.00 mg/dL Final  07/13/2015 02:09 PM 1.02 (H) 0.44 - 1.00 mg/dL Final  07/11/2015 02:15 PM 1.00 0.44 - 1.00 mg/dL Final  07/16/2012 08:07 PM 1.10 0.50 - 1.10 mg/dL Final  12/21/2008 08:28 PM 1.3 (H) 0.4 - 1.2 mg/dL Final  11/28/2008 09:32 PM 0.74 0.40 - 1.20 mg/dL Final  05/10/2008 01:44 AM 0.75 0.40 - 1.20 mg/dL Final  05/21/2007 09:27 PM 0.58 0.40 - 1.20 mg/dL Final     PMHx:   Past Medical History:  Diagnosis Date  . Complication of anesthesia   . Constipation   . Diabetes mellitus    Type II  . Dyspnea   . Hyperlipemia   . Hypertension   .  PONV (postoperative nausea and vomiting)     Past Surgical History:  Procedure Laterality Date  . APPENDECTOMY    . EYE SURGERY Bilateral    Cataract  . FOREIGN BODY REMOVAL Left 05/13/2018   Procedure: FOREIGN BODY REMOVAL left foot;  Surgeon: Meredith Pel, MD;  Location: [place];  Service: Orthopedics;  Laterality: Left;  . I&D EXTREMITY Left 05/21/2018   Procedure: LEFT FOOT DEBRIDEMENT AND WOUND CLOSURE;  Surgeon: Newt Minion, MD;  Location: [facility];  Service: Orthopedics;  Laterality: Left;  . TONSILLECTOMY    . TUBAL LIGATION      Family Hx:  Family History  Problem Relation Age of Onset  . Hypertension Mother   . Diabetes Mother   . Hypertension Sister      Social History:  reports that she quit smoking about 35 years ago. Her smoking use included cigars. She quit after 4.00 years of use. She has never used smokeless tobacco. She reports that she does not drink alcohol or use drugs.  Allergies: No Known Allergies  Medications: Prior to Admission medications   Medication Sig Start Date End Date Taking? Authorizing Provider  amLODipine (NORVASC) 5 MG tablet Take 1 tablet (5 mg total) by mouth daily. 09/08/16  Yes TatShanon Brow, MD  aspirin EC 81 MG tablet Take 1 tablet (81 mg total) by mouth daily. 09/08/16  Yes Tat, Shanon Brow, MD  carvedilol (COREG) 6.25 MG tablet Take 1 tablet (6.25 mg total) by mouth 2 (two) times daily with a meal. 09/08/16  Yes Tat, David, MD  chlorthalidone (HYGROTON) 25 MG tablet Take 25 mg by mouth daily.   Yes [provider]  HYDROcodone-acetaminophen (NORCO/VICODIN) 5-325 MG tablet Take 1-2 tablets by mouth every 4 (four) hours as needed for moderate pain (pain score 4-6). 05/14/18  Yes Danford, Suann Larry, MD  Insulin Detemir (LEVEMIR) 100 UNIT/ML Pen Inject 30 Units into the skin daily with breakfast. Patient taking differently: Inject 32-36 Units into the skin See admin instructions. Inject 36 units Subcutaneous in the morning, then inject 32 units subcutaneous in the evening per patient 09/09/16  Yes Tat, Shanon Brow, MD  Insulin Pen Needle 32G X 4 MM MISC Use with insulin pen to dispense insulin as directed 09/08/16  Yes Tat, Shanon Brow, MD  pentoxifylline (TRENTAL) 400 MG CR tablet Take 1 tablet (400 mg total) by mouth 3 (three) times daily with meals. 06/10/18  Yes Newt Minion, MD    I have reviewed the patient's current medications.  Labs:  Results for orders placed or performed during the hospital encounter of 08/05/19 (from the past 48 hour(s))  CBC with Differential     Status: Abnormal   Collection Time: 08/05/19  2:44 PM  Result Value Ref Range   WBC 7.4 4.0 - 10.5 K/uL   RBC 3.28 (L) 3.87 - 5.11 MIL/uL    Hemoglobin 9.4 (L) 12.0 - 15.0 g/dL   HCT 30.1 (L) 36.0 - 46.0 %   MCV 91.8 80.0 - 100.0 fL   MCH 28.7 26.0 - 34.0 pg   MCHC 31.2 30.0 - 36.0 g/dL   RDW 13.2 11.5 - 15.5 %   Platelets 335 150 - 400 K/uL   nRBC 0.0 0.0 - 0.2 %   Neutrophils Relative % 66 %   Neutro Abs 4.8 1.7 - 7.7 K/uL   Lymphocytes Relative 25 %   Lymphs Abs 1.9 0.7 - 4.0 K/uL   Monocytes Relative 6 %   Monocytes Absolute 0.4 0.1 -  1.0 K/uL   Eosinophils Relative 2 %   Eosinophils Absolute 0.2 0.0 - 0.5 K/uL   Basophils Relative 1 %   Basophils Absolute 0.0 0.0 - 0.1 K/uL   Immature Granulocytes 0 %   Abs Immature Granulocytes 0.03 0.00 - 0.07 K/uL    Comment: Performed at [facility], [place] [address], [place], [place] [id number]  Basic metabolic panel     Status: Abnormal   Collection Time: 08/05/19  2:44 PM  Result Value Ref Range   Sodium 138 135 - 145 mmol/L   Potassium 6.2 (H) 3.5 - 5.1 mmol/L   Chloride 112 (H) 98 - 111 mmol/L   CO2 18 (L) 22 - 32 mmol/L   Glucose, Bld 214 (H) 70 - 99 mg/dL   BUN 45 (H) 8 - 23 mg/dL   Creatinine, Ser 3.26 (H) 0.44 - 1.00 mg/dL   Calcium 9.1 8.9 - 10.3 mg/dL   GFR calc non Af Amer 14 (L) >60 mL/min   GFR calc Af Amer 16 (L) >60 mL/min   Anion gap 8 5 - 15    Comment: Performed at [facility] [address], [place], [place] [address]  Troponin I (High Sensitivity)     Status: Abnormal   Collection Time: 08/05/19  2:44 PM  Result Value Ref Range   Troponin I (High Sensitivity) 21 (H) <18 ng/L    Comment: (NOTE) Elevated high sensitivity troponin I (hsTnI) values and significant  changes across serial measurements may suggest ACS but many other  chronic and acute conditions are known to elevate hsTnI results.  Refer to the "Links" section for chest pain algorithms and additional  guidance. Performed at [facility], [place] [address], [place], [place] [address]   Brain natriuretic peptide     Status: Abnormal   Collection Time: 08/05/19  2:44  PM  Result Value Ref Range   B Natriuretic Peptide 471.4 (H) 0.0 - 100.0 pg/mL    Comment: Performed at [facility] [address], [place], [place] [address]  SARS CORONAVIRUS 2 (TAT 6-24 HRS) Nasopharyngeal Nasopharyngeal Swab     Status: None   Collection Time: 08/05/19  9:43 PM   Specimen: Nasopharyngeal Swab  Result Value Ref Range   SARS Coronavirus 2 NEGATIVE NEGATIVE    Comment: (NOTE) SARS-CoV-2 target nucleic acids are NOT DETECTED. The SARS-CoV-2 RNA is generally detectable in upper and lower respiratory specimens during the acute phase of infection. Negative results do not preclude SARS-CoV-2 infection, do not rule out co-infections with other pathogens, and should not be used as the sole basis for treatment or other patient management decisions. Negative results must be combined with clinical observations, patient history, and epidemiological information. The expected result is Negative. Fact Sheet for Patients: SugarRoll.be Fact Sheet for Healthcare Providers: https://www.woods-mathews.com/ This test is not yet approved or cleared by the [place] FDA and  has been authorized for detection and/or diagnosis of SARS-CoV-2 by FDA under an Emergency Use Authorization (EUA). This EUA will remain  in effect (meaning this test can be used) for the duration of the COVID-19 declaration under Section 56 4(b)(1) of the Act, 21 U.S.C. section 360bbb-3(b)(1), unless the authorization is terminated or revoked sooner. Performed at [facility], [place] [address] [place],  [address]   CBG monitoring, ED     Status: Abnormal   Collection Time: 08/05/19 10:55 PM  Result Value Ref Range   Glucose-Capillary 137 (H) 70 -  99 mg/dL  HIV Antibody (routine testing w rflx)     Status: None   Collection Time: 08/06/19 12:41 AM  Result Value Ref Range   HIV Screen 4th Generation wRfx NON REACTIVE NON REACTIVE    Comment:  Performed at [facility] [address], [place], [place] [address]  CBC WITH DIFFERENTIAL     Status: Abnormal   Collection Time: 08/06/19 12:41 AM  Result Value Ref Range   WBC 7.1 4.0 - 10.5 K/uL   RBC 2.95 (L) 3.87 - 5.11 MIL/uL   Hemoglobin 8.7 (L) 12.0 - 15.0 g/dL   HCT 26.7 (L) 36.0 - 46.0 %   MCV 90.5 80.0 - 100.0 fL   MCH 29.5 26.0 - 34.0 pg   MCHC 32.6 30.0 - 36.0 g/dL   RDW 13.1 11.5 - 15.5 %   Platelets 304 150 - 400 K/uL   nRBC 0.0 0.0 - 0.2 %   Neutrophils Relative % 61 %   Neutro Abs 4.2 1.7 - 7.7 K/uL   Lymphocytes Relative 28 %   Lymphs Abs 2.0 0.7 - 4.0 K/uL   Monocytes Relative 8 %   Monocytes Absolute 0.6 0.1 - 1.0 K/uL   Eosinophils Relative 3 %   Eosinophils Absolute 0.2 0.0 - 0.5 K/uL   Basophils Relative 0 %   Basophils Absolute 0.0 0.0 - 0.1 K/uL   Immature Granulocytes 0 %   Abs Immature Granulocytes 0.02 0.00 - 0.07 K/uL    Comment: Performed at [facility], [address], [place], [place] [address]  Hemoglobin A1c     Status: Abnormal   Collection Time: 08/06/19 12:41 AM  Result Value Ref Range   Hgb A1c MFr Bld 9.3 (H) 4.8 - 5.6 %    Comment: (NOTE) Pre diabetes:          5.7%-6.4% Diabetes:              >6.4% Glycemic control for   <7.0% adults with diabetes    Mean Plasma Glucose 220.21 mg/dL    Comment: Performed at [facility] [address] [place], [place] [id number]  Basic metabolic panel     Status: Abnormal   Collection Time: 08/06/19  3:54 AM  Result Value Ref Range   Sodium 138 135 - 145 mmol/L   Potassium 5.8 (H) 3.5 - 5.1 mmol/L   Chloride 109 98 - 111 mmol/L   CO2 20 (L) 22 - 32 mmol/L   Glucose, Bld 252 (H) 70 - 99 mg/dL   BUN 51 (H) 8 - 23 mg/dL   Creatinine, Ser 3.66 (H) 0.44 - 1.00 mg/dL   Calcium 8.9 8.9 - 10.3 mg/dL   GFR calc non Af Amer 12 (L) >60 mL/min   GFR calc Af Amer 14 (L) >60 mL/min   Anion gap 9 5 - 15    Comment: Performed at [facility], [place] [address], [place],  [place] [address]  CBG monitoring, ED     Status: Abnormal   Collection Time: 08/06/19  9:57 AM  Result Value Ref Range   Glucose-Capillary 246 (H) 70 - 99 mg/dL  Glucose, capillary     Status: Abnormal   Collection Time: 08/06/19  4:44 PM  Result Value Ref Range   Glucose-Capillary 173 (H) 70 - 99 mg/dL   Comment 1 Notify RN    Comment 2 Document in Chart   Glucose, capillary     Status: None   Collection  Time: 08/06/19  9:41 PM  Result Value Ref Range   Glucose-Capillary 82 70 - 99 mg/dL  Glucose, capillary     Status: None   Collection Time: 08/06/19 11:10 PM  Result Value Ref Range   Glucose-Capillary 99 70 - 99 mg/dL  Glucose, capillary     Status: None   Collection Time: 08/07/19  6:32 AM  Result Value Ref Range   Glucose-Capillary 91 70 - 99 mg/dL  Basic metabolic panel     Status: Abnormal   Collection Time: 08/07/19  9:09 AM  Result Value Ref Range   Sodium 136 135 - 145 mmol/L   Potassium 5.3 (H) 3.5 - 5.1 mmol/L   Chloride 107 98 - 111 mmol/L   CO2 14 (L) 22 - 32 mmol/L   Glucose, Bld 221 (H) 70 - 99 mg/dL   BUN 51 (H) 8 - 23 mg/dL   Creatinine, Ser 4.18 (H) 0.44 - 1.00 mg/dL   Calcium 8.3 (L) 8.9 - 10.3 mg/dL   GFR calc non Af Amer 10 (L) >60 mL/min   GFR calc Af Amer 12 (L) >60 mL/min   Anion gap 15 5 - 15    Comment: Performed at [facility] [address], [place], [place] [address]  Troponin I (High Sensitivity)     Status: None   Collection Time: 08/07/19  9:09 AM  Result Value Ref Range   Troponin I (High Sensitivity) 12 <18 ng/L    Comment: Performed at [facility] [address] [place], [place] [address]  Glucose, capillary     Status: Abnormal   Collection Time: 08/07/19 11:12 AM  Result Value Ref Range   Glucose-Capillary 167 (H) 70 - 99 mg/dL   Comment 1 Notify RN    Comment 2 Document in Chart      ROS:  A comprehensive review of systems was negative except for: Respiratory: positive for dyspnea on exertion and But improved from  admission Gastrointestinal: positive for nausea and vomiting  Physical Exam: Vitals:   08/07/19 0930 08/07/19 1114  BP: (!) 161/78 (!) 169/87  Pulse: 77 80  Resp: 18 20  Temp:  98.3 F (36.8 C)  SpO2: 96% 100%     General: Well appearing black female.  Alert and in no acute distress at present HEENT: Pupils are equal round reactive to light, extraocular motions are intact, mucous membranes are moist Neck: No JVD Heart: Regular rate and rhythm Lungs: Really clear to auscultation bilaterally Abdomen: Soft, nontender, nondistended Extremities: No peripheral edema Skin: Warm and dry Neuro: Alert and nonfocal  Assessment/Plan: 68 year old black female with diabetes, hypertension and advanced CKD at baseline.  She presents with hypertensive urgency in the setting of medication noncompliance.  With correction kidney function has worsened 1.Renal-fairly advanced CKD at baseline.  Patient was quoted kidney function at 15% recently as an outpatient.  This was about where it was upon admission but GFR has worsened slightly during the course of hospitalization-current GFR of 12.  Possibly with some uremic symptoms although is difficult to tell.  We often see worsening of kidney function in the setting of hypertensive urgency with correction.  We are going to have to help that with stabilization of blood pressure kidney function could possibly improve.  However, I did tell her how poor her kidney function is at present and that the nausea could be related to poor kidney function 2. Hypertension/volume  -hypertensive urgency secondary to medication noncompliance.  She is not  sure of the medication she was taking at home.  We think she was on Norvasc and Coreg.  Currently here in the hospital is on Norvasc 10, carvedilol 12.5 twice daily, hydralazine 25 3 times daily with very reasonable control at times.  Of note, I am pretty sure patient is never going to be able to keep up with a 3 times daily medicine.   Her volume status actually seems reasonable at this time.  I do not think she needs diuresis 3.  Hyperkalemia-seems to be trending down with just her initial Lasix administration.  I do not think anything additional is needed at this time 4. Anemia  -anemic and is likely related to her CKD.  Will check iron stores and give ESA   Louis Meckel 08/07/2019, 1:57 PM

## 2019-08-07 NOTE — Progress Notes (Signed)
PROGRESS NOTE  Melissa Howe  DOB: Apr 21, 1951  PCP: Javier Docker, MD [id number]  DOA: 08/05/2019  LOS: 1 day   Brief narrative: Patient is a 68 y.o. female with medical history significant of HTN, HLD, and DM who lives at home and cares for her mother with dementia.  She presented to the ED on 10/9 with complaint of shortness of breath and cough.  She had intermittent episodes of cough, wheezing and shortness of breath for last 1 month which would resolve on their own.  10/9, patient woke up with shortness of breath and it did not improve.  She also noted mild pedal edema.  Patient states she frequently misses her medications because of 'family stress.'  In the ED,no fever, blood pressure was elevated over 200 and spiked up to a maximum of 227/90.  At rest, patient is maintaining oxygen saturation more than 95% room air. Blood work showed sodium 138, potassium elevated to 6.2, BUN/creatinine 45/3.26, BNP 471, troponin slightly elevated to 21.  WBC count normal 7.4, hemoglobin 9.4. COVID-19 test negative. Chest x-ray showed mild to moderate diffuse bilateral interstitial opacities representative of interstitial edema or atypical infection. EKG showed normal sinus rhythm at 82 bpm, no ST-T wave changes, voltage criteria for LVH. Because of persistently elevated blood pressure, patient was given a dose of Lasix 80 mg IV and was started on nitroglycerin drip.  Subjective: Patient was seen and examined this afternoon.  Pleasant.  Lying in bed.  Not in distress.  Not on oxygen supplementation.  No pedal edema.  Creatinine worsened today.  Nephrology consult called.  Assessment/Plan: Acute hypertensive emergency -Presented with shortness of breath, chest discomfort.  Blood pressure elevated up to 227/90. -Patient was given a dose of Lasix 80 mg IV and nitroglycerin drip.  Also continued on Lasix 60 mg IV twice daily. -She was started on nitroglycerin drip which was later weaned  down. -Home meds include Coreg, Norvasc and chlorthalidone.  Continue Coreg and Norvasc at elevated doses.  Chlorthalidone remains on hold.  IV Lasix on hold. -Continue to monitor blood pressure.  Acute exacerbation of diastolic congestive heart failure Acute pulmonary edema -Presented with shortness of breath.  Chest x-ray findings likely secondary to pulmonary edema.   -CHF was precipitated likely because of hypertensive emergency -Echocardiogram 10/10 showed EF 55 to 60% with impaired relaxation  -Not on diuretics at home. -Unable to continue diuretics at this time because of AKI.  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  -Creatinine on admission 3.26, steadily worsened to 4.18 today. -Nephrology consultation called  Diabetes mellitus type 2 -A1c 9.3 -Home meds include Levemir 30 units twice a day. -Currently on Levemir 30 units daily and sliding scale insulin.  Increase Levemir to 35 minutes daily.   -Continue Accu-Cheks.  Needs adjustment with change in creatinine clearance.  Hyperlipidemia -Continue Crestor and aspirin. -Lipids were checked in 2/20 (TC 165, HDL 58, LDL 82, TG 126).  Mobility: Encourage ambulation DVT prophylaxis:  Stop Lovenox because of worsening creatinine clearance.  Start SCD boots Code Status:   Code Status: Full Code  Family Communication: Expected Discharge:  Unable to discharge home because of worsening AKI.  Needs 2-3 more days of hospital stay.  Consultants:  None  Procedures:  None  Antimicrobials: Anti-infectives (From admission, onward)   None      Diet Order            Diet heart healthy/carb modified Room service appropriate?  Yes; Fluid consistency: Thin  Diet effective now              Infusions:   sodium chloride     nitroGLYCERIN Stopped (08/06/19 1128)    Scheduled Meds:  amLODipine  10 mg Oral Daily   aspirin EC  81 mg Oral Daily   carvedilol  12.5 mg Oral BID WC    hydrALAZINE  25 mg Oral Q8H   insulin aspart  0-5 Units Subcutaneous QHS   insulin aspart  0-9 Units Subcutaneous TID WC   insulin detemir  30 Units Subcutaneous Daily   pentoxifylline  400 mg Oral TID WC   rosuvastatin  20 mg Oral Daily   sodium chloride flush  3 mL Intravenous Q12H   sodium chloride flush  3 mL Intravenous Q12H    PRN meds: sodium chloride, acetaminophen, hydrALAZINE, HYDROcodone-acetaminophen, ondansetron (ZOFRAN) IV, sodium chloride flush   Objective: Vitals:   08/07/19 0930 08/07/19 1114  BP: (!) 161/78 (!) 169/87  Pulse: 77 80  Resp: 18 20  Temp:  98.3 F (36.8 C)  SpO2: 96% 100%    Intake/Output Summary (Last 24 hours) at 08/07/2019 1420 Last data filed at 08/07/2019 0635 Gross per 24 hour  Intake 480 ml  Output 700 ml  Net -220 ml   Filed Weights   08/05/19 1133 08/06/19 1527 08/07/19 0631  Weight: 77.1 kg 73.7 kg 72.4 kg   Weight change: -3.39 kg Body mass index is 26.99 kg/m.   Physical Exam: General exam: Appears calm and comfortable.  Skin: No rashes, lesions or ulcers. HEENT: Atraumatic, normocephalic, supple neck, no obvious bleeding Lungs: Bibasilar crackles CVS: Regular rate and rhythm, no murmur GI/Abd soft, nontender, nondistended bowel sound present CNS: Alert, awake, oriented x3 Psychiatry: Mood appropriate Extremities: No pedal edema, no calf tenderness  Data Review: I have personally reviewed the laboratory data and studies available.  Recent Labs  Lab 08/05/19 1444 08/06/19 0041  WBC 7.4 7.1  NEUTROABS 4.8 4.2  HGB 9.4* 8.7*  HCT 30.1* 26.7*  MCV 91.8 90.5  PLT 335 304   Recent Labs  Lab 08/05/19 1444 08/06/19 0354 08/07/19 0909  NA 138 138 136  K 6.2* 5.8* 5.3*  CL 112* 109 107  CO2 18* 20* 14*  GLUCOSE 214* 252* 221*  BUN 45* 51* 51*  CREATININE 3.26* 3.66* 4.18*  CALCIUM 9.1 8.9 8.3*    Terrilee Croak, MD  [facility] 08/07/2019

## 2019-08-08 LAB — BASIC METABOLIC PANEL
Anion gap: 9 (ref 5–15)
BUN: 59 mg/dL — ABNORMAL HIGH (ref 8–23)
CO2: 22 mmol/L (ref 22–32)
Calcium: 8.6 mg/dL — ABNORMAL LOW (ref 8.9–10.3)
Chloride: 107 mmol/L (ref 98–111)
Creatinine, Ser: 4.58 mg/dL — ABNORMAL HIGH (ref 0.44–1.00)
GFR calc Af Amer: 11 mL/min — ABNORMAL LOW (ref >60)
GFR calc non Af Amer: 9 mL/min — ABNORMAL LOW (ref >60)
Glucose, Bld: 101 mg/dL — ABNORMAL HIGH (ref 70–99)
Potassium: 5.1 mmol/L (ref 3.5–5.1)
Sodium: 138 mmol/L (ref 135–145)

## 2019-08-08 LAB — CBC WITH DIFFERENTIAL/PLATELET
Abs Immature Granulocytes: 0.03 10*3/uL (ref 0.00–0.07)
Basophils Absolute: 0 10*3/uL (ref 0.0–0.1)
Basophils Relative: 0 %
Eosinophils Absolute: 0.4 10*3/uL (ref 0.0–0.5)
Eosinophils Relative: 5 %
HCT: 29.3 % — ABNORMAL LOW (ref 36.0–46.0)
Hemoglobin: 9.2 g/dL — ABNORMAL LOW (ref 12.0–15.0)
Immature Granulocytes: 0 %
Lymphocytes Relative: 41 %
Lymphs Abs: 3.2 10*3/uL (ref 0.7–4.0)
MCH: 28.3 pg (ref 26.0–34.0)
MCHC: 31.4 g/dL (ref 30.0–36.0)
MCV: 90.2 fL (ref 80.0–100.0)
Monocytes Absolute: 0.7 10*3/uL (ref 0.1–1.0)
Monocytes Relative: 9 %
Neutro Abs: 3.4 10*3/uL (ref 1.7–7.7)
Neutrophils Relative %: 45 %
Platelets: 319 10*3/uL (ref 150–400)
RBC: 3.25 MIL/uL — ABNORMAL LOW (ref 3.87–5.11)
RDW: 12.9 % (ref 11.5–15.5)
WBC: 7.6 10*3/uL (ref 4.0–10.5)
nRBC: 0 % (ref 0.0–0.2)

## 2019-08-08 LAB — IRON AND TIBC
Iron: 30 ug/dL (ref 28–170)
Saturation Ratios: 12 % (ref 10.4–31.8)
TIBC: 251 ug/dL (ref 250–450)
UIBC: 221 ug/dL

## 2019-08-08 LAB — GLUCOSE, CAPILLARY
Glucose-Capillary: 220 mg/dL — ABNORMAL HIGH (ref 70–99)
Glucose-Capillary: 75 mg/dL (ref 70–99)
Glucose-Capillary: 171 mg/dL — ABNORMAL HIGH (ref 70–99)
Glucose-Capillary: 222 mg/dL — ABNORMAL HIGH (ref 70–99)
Glucose-Capillary: 151 mg/dL — ABNORMAL HIGH (ref 70–99)

## 2019-08-08 LAB — FERRITIN: Ferritin: 136 ng/mL (ref 11–307)

## 2019-08-08 NOTE — Progress Notes (Signed)
Physical Therapy Treatment Patient Details Name: Melissa Howe MRN: [id number] DOB: 12/13/1950 Today's Date: 08/08/2019    History of Present Illness 68 yo female with onset of cough and SOB over time for weeks was brought to hosp, note her subacute CHF and lung opacities, lung edema, CKD and AKI.  Covid (-).  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  PMHx:  HTN, DM, CKD, HLD, CHF    PT Comments    Pt doing very well physically - today we discussed walking program and HEP.  All PT education complete. Pt OK to walk in halls independently.  Pt will continue increasing her activity level at home.  PT signing off at this time.   Follow Up Recommendations  No PT follow up     Equipment Recommendations  None recommended by PT    Recommendations for Other Services       Precautions / Restrictions Precautions Precautions: None Restrictions Weight Bearing Restrictions: No    Mobility  Bed Mobility Overal bed mobility: Independent                Transfers Overall transfer level: Independent Equipment used: None                Ambulation/Gait Ambulation/Gait assistance: Independent Gait Distance (Feet): 600 Feet Assistive device: None Gait Pattern/deviations: WFL(Within Functional Limits)     General Gait Details: Pt not tired after 2 full laps on the floor. Pts ending vitals - no different from pregait - pulse ox 99% and pulse 76bpm.  Pt not tired.  We talked about getting on a walking program and how to do this safely   Stairs             Wheelchair Mobility    Modified [place] (Stroke Patients Only)       Balance Overall balance assessment: Modified Independent                                          Cognition Arousal/Alertness: Awake/alert Behavior During Therapy: WFL for tasks assessed/performed Overall Cognitive Status: Within Functional Limits for tasks assessed                                 General Comments: Pt cooperative.  discussed DM managment at home -importance of monitoring sugars, eating regular meals and increasing her activity level at home      Exercises Other Exercises Other Exercises: exercise program given for home - standing at sink -holding wtih one finger - hip abduction x 10 reps - hard for pt to do slowly and with control - we talked about how to progress Other Exercises: standing at sink - not holding on - heel raises - pts ankles wanted to roll out - talked about keepign weight on big toe side.  tp did slowly x 15 Other Exercises: sit to stands from bed - educated on technque - pt wanted to bend back and not knees.  pt did 3 sets of 3 - this was hard for her.  encouraged her to build up to 10    General Comments        Pertinent Vitals/Pain Pain Assessment: No/denies pain    Home Living  Prior Function            PT Goals (current goals can now be found in the care plan section) Progress towards PT goals: Goals met/education completed, patient discharged from PT    Frequency           PT Plan Discharge plan needs to be updated    Co-evaluation              AM-PAC PT "6 Clicks" Mobility   Outcome Measure  Help needed turning from your back to your side while in a flat bed without using bedrails?: None Help needed moving from lying on your back to sitting on the side of a flat bed without using bedrails?: None Help needed moving to and from a bed to a chair (including a wheelchair)?: None Help needed standing up from a chair using your arms (e.g., wheelchair or bedside chair)?: None Help needed to walk in hospital room?: None Help needed climbing 3-5 steps with a railing? : None 6 Click Score: 24    End of Session   Activity Tolerance: Patient tolerated treatment well;No increased pain Patient left: in bed;with call bell/phone within reach Nurse Communication:  Mobility status PT Visit Diagnosis: Muscle weakness (generalized) (M62.81)     Time: 6767-2094 PT Time Calculation (min) (ACUTE ONLY): 29 min  Charges:  $Gait Training: 8-22 mins $Therapeutic Exercise: 8-22 mins                     08/08/2019   Rande Lawman, PT    Loyal Buba 08/08/2019, 10:45 AM

## 2019-08-08 NOTE — Progress Notes (Signed)
Patient ID: Melissa Howe, female   DOB: 1951-06-12, 68 y.o.   MRN: [id number] S: Feels better but still has blurry spot in her vision from her left eye.  No n/v/d, anorexia, or dysgeusia.   O:BP 134/62 (BP Location: Left Arm)   Pulse 68   Temp 98 F (36.7 C) (Oral)   Resp 20   Ht 5' 4.5" (1.638 m)   Wt 73.3 kg   SpO2 97%   BMI 27.29 kg/m   Intake/Output Summary (Last 24 hours) at 08/08/2019 1200 Last data filed at 08/08/2019 0836 Gross per 24 hour  Intake 480 ml  Output 475 ml  Net 5 ml   Intake/Output: I/O last 3 completed shifts: In: 54 [P.O.:960] Out: 1000 [Urine:1000]  Intake/Output this shift:  Total I/O In: -  Out: 475 [Urine:475] Weight change: -0.454 kg Gen: NAD CVS: no rub Resp: cta Abd: benign Ext: no edema  Recent Labs  Lab 08/05/19 1444 08/06/19 0354 08/07/19 0909 08/08/19 0714  NA 138 138 136 138  K 6.2* 5.8* 5.3* 5.1  CL 112* 109 107 107  CO2 18* 20* 14* 22  GLUCOSE 214* 252* 221* 101*  BUN 45* 51* 51* 59*  CREATININE 3.26* 3.66* 4.18* 4.58*  CALCIUM 9.1 8.9 8.3* 8.6*   Liver Function Tests: No results for input(s): AST, ALT, ALKPHOS, BILITOT, PROT, ALBUMIN in the last 168 hours. No results for input(s): LIPASE, AMYLASE in the last 168 hours. No results for input(s): AMMONIA in the last 168 hours. CBC: Recent Labs  Lab 08/05/19 1444 08/06/19 0041 08/08/19 0714  WBC 7.4 7.1 7.6  NEUTROABS 4.8 4.2 3.4  HGB 9.4* 8.7* 9.2*  HCT 30.1* 26.7* 29.3*  MCV 91.8 90.5 90.2  PLT 335 304 319   Cardiac Enzymes: No results for input(s): CKTOTAL, CKMB, CKMBINDEX, TROPONINI in the last 168 hours. CBG: Recent Labs  Lab 08/07/19 1112 08/07/19 1708 08/07/19 2208 08/08/19 0544 08/08/19 1122  GLUCAP 167* 180* 161* 75 171*    Iron Studies:  Recent Labs    08/08/19 0714  IRON 30  TIBC 251  FERRITIN 136   Studies/Results: No results found. Marland Kitchen amLODipine  10 mg Oral Daily  . aspirin EC  81 mg Oral Daily  . carvedilol  12.5 mg Oral BID  WC  . darbepoetin (ARANESP) injection - NON-DIALYSIS  100 mcg Subcutaneous Q Sun-1800  . hydrALAZINE  25 mg Oral Q8H  . insulin aspart  0-5 Units Subcutaneous QHS  . insulin aspart  0-9 Units Subcutaneous TID WC  . insulin detemir  35 Units Subcutaneous Daily  . pentoxifylline  400 mg Oral TID WC  . rosuvastatin  20 mg Oral Daily  . sodium chloride flush  3 mL Intravenous Q12H  . sodium chloride flush  3 mL Intravenous Q12H    BMET    Component Value Date/Time   NA 138 08/08/2019 0714   K 5.1 08/08/2019 0714   CL 107 08/08/2019 0714   CO2 22 08/08/2019 0714   GLUCOSE 101 (H) 08/08/2019 0714   BUN 59 (H) 08/08/2019 0714   CREATININE 4.58 (H) 08/08/2019 0714   CALCIUM 8.6 (L) 08/08/2019 0714   GFRNONAA 9 (L) 08/08/2019 0714   GFRAA 11 (L) 08/08/2019 0714   CBC    Component Value Date/Time   WBC 7.6 08/08/2019 0714   RBC 3.25 (L) 08/08/2019 0714   HGB 9.2 (L) 08/08/2019 0714   HCT 29.3 (L) 08/08/2019 0714   PLT 319 08/08/2019 0714   MCV  90.2 08/08/2019 0714   MCH 28.3 08/08/2019 0714   MCHC 31.4 08/08/2019 0714   RDW 12.9 08/08/2019 0714   LYMPHSABS 3.2 08/08/2019 0714   MONOABS 0.7 08/08/2019 0714   EOSABS 0.4 08/08/2019 0714   BASOSABS 0.0 08/08/2019 0714     Assessment/Plan:  1. AKI/CKD stage 4- with hypertensive urgency.  No uremic symptoms but did discuss the severity and chronicity of her CKD.  Cont to educate and prepare for HD when needed.  No indication for HD at this time.  If Cr does not improve will consult VVS for vein mapping and likely [facility] and AVF/AVG placement. 2. HTN/volume- stressed compliance with meds and BP has improved. 3. Hyperkalemia- trending down.  Cont to follow 4. Anemia of CKD- TSAT 12%, will replete with IV ferahema and follow but will likely require ESA as an outpatient.   Donetta Potts, MD [employer] [phone]

## 2019-08-08 NOTE — Progress Notes (Addendum)
PROGRESS NOTE  Melissa Howe  DOB: [birth year]  PCP: Javier Docker, MD [id number]  DOA: 08/05/2019  LOS: 2 days   Brief narrative: Patient is a 68 y.o. female with medical history significant of HTN, HLD, and DM who lives at home and cares for her mother with dementia.  She presented to the ED on 10/9 with complaint of shortness of breath and cough.  She had intermittent episodes of cough, wheezing and shortness of breath for last 1 month which would resolve on their own.  10/9, patient woke up with shortness of breath and it did not improve.  She also noted mild pedal edema.  Patient states she frequently misses her medications because of 'family stress.'  In the ED,no fever, blood pressure was elevated over 200 and spiked up to a maximum of 227/90.  At rest, patient is maintaining oxygen saturation more than 95% room air. Blood work showed sodium 138, potassium elevated to 6.2, BUN/creatinine 45/3.26, BNP 471, troponin slightly elevated to 21.  WBC count normal 7.4, hemoglobin 9.4. COVID-19 test negative. Chest x-ray showed mild to moderate diffuse bilateral interstitial opacities representative of interstitial edema or atypical infection. EKG showed normal sinus rhythm at 82 bpm, no ST-T wave changes, voltage criteria for LVH. Because of persistently elevated blood pressure, patient was given a dose of Lasix 80 mg IV and was started on nitroglycerin drip.   Subjective: Patient was seen and examined this afternoon.  Pleasant.  Sitting up in bed.  Tearful.  She states she has been told that she will be heading to dialysis if the kidney function does not improve. Not on oxygen supplementation.  No pedal edema.  Creatinine continues to worsen.  Blood pressure has improved.    Assessment/Plan: Acute hypertensive emergency -Presented with shortness of breath, chest discomfort.  Blood pressure elevated up to 227/90. -Patient was given a dose of Lasix 80 mg IV and nitroglycerin  drip.  Also continued on Lasix 60 mg IV twice daily. -She was started on nitroglycerin drip which was later weaned down. -Home meds include Coreg, Norvasc and chlorthalidone.   -Currently on increased doses of Coreg and Norvasc.  Diuretics on hold because of AKI. -Continue to monitor blood pressure.  Acute exacerbation of diastolic congestive heart failure Acute pulmonary edema -Presented with shortness of breath.  Chest x-ray findings likely secondary to pulmonary edema.   -CHF was precipitated likely because of hypertensive emergency -Echocardiogram 10/10 showed EF 55 to 60% with impaired relaxation  -Not on diuretics at home. -Unable to continue diuretics at this time because of AKI. -No longer having shortness of breath.  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  -Creatinine on admission 3.26, steadily worsened to 4.58 today. -Nephrology consultation appreciated.  Diabetes mellitus type 2 -A1c 9.3 -Home meds include Levemir 30 units twice a day. -Currently on Levemir 30 units daily and sliding scale insulin.  Increase Levemir to 35 minutes daily.   -Continue Accu-Cheks.  Needs adjustment with change in creatinine clearance.  Hyperlipidemia -Continue Crestor and aspirin. -Lipids were checked in 2/20 (TC 165, HDL 58, LDL 82, TG 126).  Hyperkalemia -Potassium was elevated to 6.2 on admission. -Improving, 5.1 today.  No changes in EKG.  Anemia of chronic disease -Transferrin saturation 12%.  IV Feraheme started by nephrology.  Mobility: Encourage ambulation DVT prophylaxis:  SCD boots.   Code Status:   Code Status: Full Code  Family Communication: Expected Discharge:  Unable to discharge home because  of worsening AKI.  Needs 2-3 more days of hospital stay.  Consultants:  None  Procedures:  None  Antimicrobials: Anti-infectives (From admission, onward)   None      Diet Order            Diet heart healthy/carb modified Room service  appropriate? Yes; Fluid consistency: Thin  Diet effective now              Infusions:  . sodium chloride    . nitroGLYCERIN Stopped (08/06/19 1128)    Scheduled Meds: . amLODipine  10 mg Oral Daily  . aspirin EC  81 mg Oral Daily  . carvedilol  12.5 mg Oral BID WC  . darbepoetin (ARANESP) injection - NON-DIALYSIS  100 mcg Subcutaneous Q Sun-1800  . hydrALAZINE  25 mg Oral Q8H  . insulin aspart  0-5 Units Subcutaneous QHS  . insulin aspart  0-9 Units Subcutaneous TID WC  . insulin detemir  35 Units Subcutaneous Daily  . pentoxifylline  400 mg Oral TID WC  . rosuvastatin  20 mg Oral Daily  . sodium chloride flush  3 mL Intravenous Q12H  . sodium chloride flush  3 mL Intravenous Q12H    PRN meds: sodium chloride, acetaminophen, hydrALAZINE, HYDROcodone-acetaminophen, ondansetron (ZOFRAN) IV, sodium chloride flush   Objective: Vitals:   08/08/19 0436 08/08/19 0831  BP: 132/73 134/62  Pulse: 70 68  Resp: 20 20  Temp: (!) 97.5 F (36.4 C) 98 F (36.7 C)  SpO2: 100% 97%    Intake/Output Summary (Last 24 hours) at 08/08/2019 1312 Last data filed at 08/08/2019 0836 Gross per 24 hour  Intake 480 ml  Output 475 ml  Net 5 ml   Filed Weights   08/06/19 1527 08/07/19 0631 08/08/19 0615  Weight: 73.7 kg 72.4 kg 73.3 kg   Weight change: -0.454 kg Body mass index is 27.29 kg/m.   Physical Exam: General exam: Appears calm and comfortable.  Skin: No rashes, lesions or ulcers. HEENT: Atraumatic, normocephalic, supple neck, no obvious bleeding Lungs: Bibasilar crackles CVS: Regular rate and rhythm, no murmur GI/Abd soft, nontender, nondistended bowel sound present CNS: Alert, awake, oriented x3 Psychiatry: Tearful at the time of my evaluation Extremities: No pedal edema, no calf tenderness  Data Review: I have personally reviewed the laboratory data and studies available.  Recent Labs  Lab 08/05/19 1444 08/06/19 0041 08/08/19 0714  WBC 7.4 7.1 7.6  NEUTROABS 4.8  4.2 3.4  HGB 9.4* 8.7* 9.2*  HCT 30.1* 26.7* 29.3*  MCV 91.8 90.5 90.2  PLT 335 304 319   Recent Labs  Lab 08/05/19 1444 08/06/19 0354 08/07/19 0909 08/08/19 0714  NA 138 138 136 138  K 6.2* 5.8* 5.3* 5.1  CL 112* 109 107 107  CO2 18* 20* 14* 22  GLUCOSE 214* 252* 221* 101*  BUN 45* 51* 51* 59*  CREATININE 3.26* 3.66* 4.18* 4.58*  CALCIUM 9.1 8.9 8.3* 8.6*    Terrilee Croak, MD  [facility] 08/08/2019

## 2019-08-09 LAB — RENAL FUNCTION PANEL
Albumin: 2.5 g/dL — ABNORMAL LOW (ref 3.5–5.0)
Anion gap: 11 (ref 5–15)
BUN: 59 mg/dL — ABNORMAL HIGH (ref 8–23)
CO2: 22 mmol/L (ref 22–32)
Calcium: 8.7 mg/dL — ABNORMAL LOW (ref 8.9–10.3)
Chloride: 105 mmol/L (ref 98–111)
Creatinine, Ser: 4.5 mg/dL — ABNORMAL HIGH (ref 0.44–1.00)
GFR calc Af Amer: 11 mL/min — ABNORMAL LOW (ref >60)
GFR calc non Af Amer: 9 mL/min — ABNORMAL LOW (ref >60)
Glucose, Bld: 58 mg/dL — ABNORMAL LOW (ref 70–99)
Phosphorus: 5.1 mg/dL — ABNORMAL HIGH (ref 2.5–4.6)
Potassium: 4.6 mmol/L (ref 3.5–5.1)
Sodium: 138 mmol/L (ref 135–145)

## 2019-08-09 LAB — CBC WITH DIFFERENTIAL/PLATELET
Abs Immature Granulocytes: 0.02 10*3/uL (ref 0.00–0.07)
Basophils Absolute: 0 10*3/uL (ref 0.0–0.1)
Basophils Relative: 1 %
Eosinophils Absolute: 0.3 10*3/uL (ref 0.0–0.5)
Eosinophils Relative: 5 %
HCT: 26.8 % — ABNORMAL LOW (ref 36.0–46.0)
Hemoglobin: 8.5 g/dL — ABNORMAL LOW (ref 12.0–15.0)
Immature Granulocytes: 0 %
Lymphocytes Relative: 47 %
Lymphs Abs: 3.2 10*3/uL (ref 0.7–4.0)
MCH: 28.4 pg (ref 26.0–34.0)
MCHC: 31.7 g/dL (ref 30.0–36.0)
MCV: 89.6 fL (ref 80.0–100.0)
Monocytes Absolute: 0.6 10*3/uL (ref 0.1–1.0)
Monocytes Relative: 8 %
Neutro Abs: 2.6 10*3/uL (ref 1.7–7.7)
Neutrophils Relative %: 39 %
Platelets: 310 10*3/uL (ref 150–400)
RBC: 2.99 MIL/uL — ABNORMAL LOW (ref 3.87–5.11)
RDW: 12.8 % (ref 11.5–15.5)
WBC: 6.8 10*3/uL (ref 4.0–10.5)
nRBC: 0 % (ref 0.0–0.2)

## 2019-08-09 LAB — GLUCOSE, CAPILLARY
Glucose-Capillary: 74 mg/dL (ref 70–99)
Glucose-Capillary: 203 mg/dL — ABNORMAL HIGH (ref 70–99)
Glucose-Capillary: 256 mg/dL — ABNORMAL HIGH (ref 70–99)
Glucose-Capillary: 166 mg/dL — ABNORMAL HIGH (ref 70–99)

## 2019-08-09 MED ORDER — INSULIN DETEMIR 100 UNIT/ML ~~LOC~~ SOLN
20.0000 [IU] | Freq: Every day | SUBCUTANEOUS | Status: DC
  Administered 2019-08-09 – 2019-08-10 (×2): 20 [IU] via SUBCUTANEOUS
  Filled 2019-08-09 (×2): qty 0.2

## 2019-08-09 MED ORDER — POLYETHYLENE GLYCOL 3350 17 G PO PACK
17.0000 g | PACK | Freq: Every day | ORAL | Status: DC | PRN
  Administered 2019-08-09 – 2019-08-11 (×3): 17 g via ORAL
  Filled 2019-08-09 (×3): qty 1
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
## 2019-08-09 NOTE — Progress Notes (Signed)
PROGRESS NOTE  Melissa Howe  DOB: April 04, 1951  PCP: Javier Docker, MD [id number]  DOA: 08/05/2019  LOS: 3 days   Brief narrative: Patient is a 68 y.o. female with medical history significant of HTN, HLD, and DM who lives at home and cares for her mother with dementia.  She presented to the ED on 10/9 with complaint of shortness of breath and cough.  She had intermittent episodes of cough, wheezing and shortness of breath for last 1 month which would resolve on their own.  10/9, patient woke up with shortness of breath and it did not improve.  She also noted mild pedal edema.  Patient states she frequently misses her medications because of 'family stress.'  In the ED,no fever, blood pressure was elevated over 200 and spiked up to a maximum of 227/90.  At rest, patient is maintaining oxygen saturation more than 95% room air. Blood work showed sodium 138, potassium elevated to 6.2, BUN/creatinine 45/3.26, BNP 471, troponin slightly elevated to 21.  WBC count normal 7.4, hemoglobin 9.4. COVID-19 test negative. Chest x-ray showed mild to moderate diffuse bilateral interstitial opacities representative of interstitial edema or atypical infection. EKG showed normal sinus rhythm at 82 bpm, no ST-T wave changes, voltage criteria for LVH. Because of persistently elevated blood pressure, patient was given a dose of Lasix 80 mg IV and was started on nitroglycerin drip.  Patient was admitted under hospitalist service for further management management. Since admission, patient shortness of breath and hypertensive urgency have improved.  The biggest issue at this time is acute kidney injury.  Subjective: Patient was seen and examined this morning.  Sitting up at the edge of the bed.  Not in distress.  No new symptoms.  No shortness of breath.  Not on oxygen supplementation.  No pedal edema.  Creatinine 4.5 today, was 4.58 yesterday, seems to have plateaued.  Patient is also mostly normal range.   Blood glucose level was low at 58 this morning.  Lantus dose reduced.  Assessment/Plan: Acute hypertensive emergency Acute exacerbation of diastolic congestive heart failure Acute pulmonary edema -Presented with shortness of breath, chest discomfort.  Blood pressure elevated up to 227/90 likely precipitating acute decompensation of CHF leading to pulmonary edema and symptoms. -Patient was started on IV Lasix and nitroglycerin drip.   -Oral blood pressure medicines were initiated.  IV Lasix and nitroglycerin drip stopped. -Echocardiogram 10/10 showed EF 55 to 60% with impaired relaxation  -Home meds include Coreg, Norvasc and chlorthalidone.   -Currently on increased doses of Coreg and Norvasc.  Diuretics on hold because of AKI. -Continue to monitor blood pressure.  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  -Creatinine on admission 3.26, steadily worsened to peak at 4.58, it is 4.50 today -Nephrology consultation appreciated.  No need of urgent dialysis at this time.  Diabetes mellitus type 2 -A1c 9.3 -Home meds include Levemir 30 units twice a day. -Currently on Levemir 35 units daily and sliding scale insulin.  Blood sugar level is low at 58 this morning.  I reduced dose of Levemir to 20 twice daily.    Hyperlipidemia -Continue Crestor and aspirin. -Lipids were checked in 2/20 (TC 165, HDL 58, LDL 82, TG 126).  Hyperkalemia -Potassium was elevated to 6.2 on admission. -Improving, 1.6 today.  No changes in EKG.  Anemia of chronic disease -Transferrin saturation 12%.  IV Feraheme started by nephrology.  Mobility: Encourage ambulation DVT prophylaxis:  SCD boots.   Code  Status:   Code Status: Full Code  Family Communication: Expected Discharge:  Unable to discharge home because of worsening AKI.  Needs 2-3 more days of hospital stay.  Consultants:  None  Procedures:  None  Antimicrobials: Anti-infectives (From admission, onward)   None       Diet Order            Diet heart healthy/carb modified Room service appropriate? Yes; Fluid consistency: Thin  Diet effective now              Infusions:  . sodium chloride    . nitroGLYCERIN Stopped (08/06/19 1128)    Scheduled Meds: . amLODipine  10 mg Oral Daily  . aspirin EC  81 mg Oral Daily  . carvedilol  12.5 mg Oral BID WC  . darbepoetin (ARANESP) injection - NON-DIALYSIS  100 mcg Subcutaneous Q Sun-1800  . hydrALAZINE  25 mg Oral Q8H  . insulin aspart  0-5 Units Subcutaneous QHS  . insulin aspart  0-9 Units Subcutaneous TID WC  . insulin detemir  20 Units Subcutaneous Daily  . pentoxifylline  400 mg Oral TID WC  . rosuvastatin  20 mg Oral Daily  . sodium chloride flush  3 mL Intravenous Q12H  . sodium chloride flush  3 mL Intravenous Q12H    PRN meds: sodium chloride, acetaminophen, hydrALAZINE, HYDROcodone-acetaminophen, ondansetron (ZOFRAN) IV, sodium chloride flush   Objective: Vitals:   08/09/19 0433 08/09/19 0800  BP: 137/66 122/63  Pulse: (!) 59 78  Resp: 14 18  Temp: 97.6 F (36.4 C) (!) 97.5 F (36.4 C)  SpO2: 96% 100%    Intake/Output Summary (Last 24 hours) at 08/09/2019 1128 Last data filed at 08/09/2019 1000 Gross per 24 hour  Intake 480 ml  Output 1100 ml  Net -620 ml   Filed Weights   08/07/19 0631 08/08/19 0615 08/09/19 0433  Weight: 72.4 kg 73.3 kg 74.1 kg   Weight change: 0.816 kg Body mass index is 27.6 kg/m.   Physical Exam: General exam: Appears calm and comfortable.  Skin: No rashes, lesions or ulcers. HEENT: Atraumatic, normocephalic, supple neck, no obvious bleeding Lungs: Bibasilar crackles CVS: Regular rate and rhythm, no murmur GI/Abd soft, nontender, nondistended bowel sound present CNS: Alert, awake, oriented x3 Psychiatry: Mood appropriate. Extremities: No pedal edema, no calf tenderness  Data Review: I have personally reviewed the laboratory data and studies available.  Recent Labs  Lab 08/05/19  1444 08/06/19 0041 08/08/19 0714 08/09/19 0429  WBC 7.4 7.1 7.6 6.8  NEUTROABS 4.8 4.2 3.4 2.6  HGB 9.4* 8.7* 9.2* 8.5*  HCT 30.1* 26.7* 29.3* 26.8*  MCV 91.8 90.5 90.2 89.6  PLT 335 304 319 310   Recent Labs  Lab 08/05/19 1444 08/06/19 0354 08/07/19 0909 08/08/19 0714 08/09/19 0429  NA 138 138 136 138 138  K 6.2* 5.8* 5.3* 5.1 4.6  CL 112* 109 107 107 105  CO2 18* 20* 14* 22 22  GLUCOSE 214* 252* 221* 101* 58*  BUN 45* 51* 51* 59* 59*  CREATININE 3.26* 3.66* 4.18* 4.58* 4.50*  CALCIUM 9.1 8.9 8.3* 8.6* 8.7*  PHOS  --   --   --   --  5.1*    Terrilee Croak, MD  [facility] 08/09/2019

## 2019-08-09 NOTE — Progress Notes (Signed)
Patient ID: Melissa Howe, female   DOB: [birth year], 68 y.o.   MRN: [id number] S: No complaints, feels well. O:BP 122/63 (BP Location: Left Arm)   Pulse 78   Temp (!) 97.5 F (36.4 C) (Oral)   Resp 18   Ht 5' 4.5" (1.638 m)   Wt 74.1 kg Comment: scale b  SpO2 100%   BMI 27.60 kg/m   Intake/Output Summary (Last 24 hours) at 08/09/2019 1122 Last data filed at 08/09/2019 1000 Gross per 24 hour  Intake 480 ml  Output 1100 ml  Net -620 ml   Intake/Output: I/O last 3 completed shifts: In: 840 [P.O.:840] Out: 1175 [Urine:1175]  Intake/Output this shift:  Total I/O In: -  Out: 400 [Urine:400] Weight change: 0.816 kg Gen: NAD CVS: no rub Resp: cta  Abd:+BS, soft, NT/ND Ext: no edema  Recent Labs  Lab 08/05/19 1444 08/06/19 0354 08/07/19 0909 08/08/19 0714 08/09/19 0429  NA 138 138 136 138 138  K 6.2* 5.8* 5.3* 5.1 4.6  CL 112* 109 107 107 105  CO2 18* 20* 14* 22 22  GLUCOSE 214* 252* 221* 101* 58*  BUN 45* 51* 51* 59* 59*  CREATININE 3.26* 3.66* 4.18* 4.58* 4.50*  ALBUMIN  --   --   --   --  2.5*  CALCIUM 9.1 8.9 8.3* 8.6* 8.7*  PHOS  --   --   --   --  5.1*   Liver Function Tests: Recent Labs  Lab 08/09/19 0429  ALBUMIN 2.5*   No results for input(s): LIPASE, AMYLASE in the last 168 hours. No results for input(s): AMMONIA in the last 168 hours. CBC: Recent Labs  Lab 08/05/19 1444 08/06/19 0041 08/08/19 0714 08/09/19 0429  WBC 7.4 7.1 7.6 6.8  NEUTROABS 4.8 4.2 3.4 2.6  HGB 9.4* 8.7* 9.2* 8.5*  HCT 30.1* 26.7* 29.3* 26.8*  MCV 91.8 90.5 90.2 89.6  PLT 335 304 319 310   Cardiac Enzymes: No results for input(s): CKTOTAL, CKMB, CKMBINDEX, TROPONINI in the last 168 hours. CBG: Recent Labs  Lab 08/08/19 0544 08/08/19 1122 08/08/19 1810 08/08/19 2131 08/09/19 0610  GLUCAP 75 171* 222* 151* 74    Iron Studies:  Recent Labs    08/08/19 0714  IRON 30  TIBC 251  FERRITIN 136   Studies/Results: No results found. Marland Kitchen amLODipine  10 mg Oral  Daily  . aspirin EC  81 mg Oral Daily  . carvedilol  12.5 mg Oral BID WC  . darbepoetin (ARANESP) injection - NON-DIALYSIS  100 mcg Subcutaneous Q Sun-1800  . hydrALAZINE  25 mg Oral Q8H  . insulin aspart  0-5 Units Subcutaneous QHS  . insulin aspart  0-9 Units Subcutaneous TID WC  . insulin detemir  20 Units Subcutaneous Daily  . pentoxifylline  400 mg Oral TID WC  . rosuvastatin  20 mg Oral Daily  . sodium chloride flush  3 mL Intravenous Q12H  . sodium chloride flush  3 mL Intravenous Q12H    BMET    Component Value Date/Time   NA 138 08/09/2019 0429   K 4.6 08/09/2019 0429   CL 105 08/09/2019 0429   CO2 22 08/09/2019 0429   GLUCOSE 58 (L) 08/09/2019 0429   BUN 59 (H) 08/09/2019 0429   CREATININE 4.50 (H) 08/09/2019 0429   CALCIUM 8.7 (L) 08/09/2019 0429   GFRNONAA 9 (L) 08/09/2019 0429   GFRAA 11 (L) 08/09/2019 0429   CBC    Component Value Date/Time   WBC 6.8 08/09/2019  0429   RBC 2.99 (L) 08/09/2019 0429   HGB 8.5 (L) 08/09/2019 0429   HCT 26.8 (L) 08/09/2019 0429   PLT 310 08/09/2019 0429   MCV 89.6 08/09/2019 0429   MCH 28.4 08/09/2019 0429   MCHC 31.7 08/09/2019 0429   RDW 12.8 08/09/2019 0429   LYMPHSABS 3.2 08/09/2019 0429   MONOABS 0.6 08/09/2019 0429   EOSABS 0.3 08/09/2019 0429   BASOSABS 0.0 08/09/2019 0429      Assessment/Plan:  1. AKI/CKD stage 4- with hypertensive urgency.  No uremic symptoms but did discuss the severity and chronicity of her CKD.  Cont to educate about the importance of medication compliance and and prepare for HD when needed.  1. Cr appears to be reaching a plateau 2. No indication for HD at this time but if her Cr does not improve will consult [facility]VS for access placement  2. HTN/volume- stressed compliance with meds and BP has improved. 3. Hyperkalemia- trending down.  Cont to follow 4. Anemia of CKD- TSAT 12%, will replete with IV ferahema and follow but will likely require ESA as an outpatient.  5. Disposition- hopefully  her Cr will cont to remain stable/improve in the next 24-48 hours so she can be discharged with close follow up in our office.  Donetta Potts, MD [employer] [phone]

## 2019-08-10 LAB — CBC WITH DIFFERENTIAL/PLATELET
Abs Immature Granulocytes: 0.03 10*3/uL (ref 0.00–0.07)
Basophils Absolute: 0 10*3/uL (ref 0.0–0.1)
Basophils Relative: 0 %
Eosinophils Absolute: 0.3 10*3/uL (ref 0.0–0.5)
Eosinophils Relative: 4 %
HCT: 27 % — ABNORMAL LOW (ref 36.0–46.0)
Hemoglobin: 8.8 g/dL — ABNORMAL LOW (ref 12.0–15.0)
Immature Granulocytes: 0 %
Lymphocytes Relative: 43 %
Lymphs Abs: 3.2 10*3/uL (ref 0.7–4.0)
MCH: 28.9 pg (ref 26.0–34.0)
MCHC: 32.6 g/dL (ref 30.0–36.0)
MCV: 88.8 fL (ref 80.0–100.0)
Monocytes Absolute: 0.7 10*3/uL (ref 0.1–1.0)
Monocytes Relative: 9 %
Neutro Abs: 3.3 10*3/uL (ref 1.7–7.7)
Neutrophils Relative %: 44 %
Platelets: 300 10*3/uL (ref 150–400)
RBC: 3.04 MIL/uL — ABNORMAL LOW (ref 3.87–5.11)
RDW: 12.8 % (ref 11.5–15.5)
WBC: 7.5 10*3/uL (ref 4.0–10.5)
nRBC: 0 % (ref 0.0–0.2)

## 2019-08-10 LAB — RENAL FUNCTION PANEL
Albumin: 2.5 g/dL — ABNORMAL LOW (ref 3.5–5.0)
Anion gap: 9 (ref 5–15)
BUN: 63 mg/dL — ABNORMAL HIGH (ref 8–23)
CO2: 22 mmol/L (ref 22–32)
Calcium: 8.6 mg/dL — ABNORMAL LOW (ref 8.9–10.3)
Chloride: 107 mmol/L (ref 98–111)
Creatinine, Ser: 4.72 mg/dL — ABNORMAL HIGH (ref 0.44–1.00)
GFR calc Af Amer: 10 mL/min — ABNORMAL LOW (ref >60)
GFR calc non Af Amer: 9 mL/min — ABNORMAL LOW (ref >60)
Glucose, Bld: 78 mg/dL (ref 70–99)
Phosphorus: 4.7 mg/dL — ABNORMAL HIGH (ref 2.5–4.6)
Potassium: 5.1 mmol/L (ref 3.5–5.1)
Sodium: 138 mmol/L (ref 135–145)

## 2019-08-10 LAB — GLUCOSE, CAPILLARY
Glucose-Capillary: 76 mg/dL (ref 70–99)
Glucose-Capillary: 136 mg/dL — ABNORMAL HIGH (ref 70–99)
Glucose-Capillary: 129 mg/dL — ABNORMAL HIGH (ref 70–99)

## 2019-08-10 MED ORDER — INSULIN DETEMIR 100 UNIT/ML ~~LOC~~ SOLN
18.0000 [IU] | Freq: Every day | SUBCUTANEOUS | Status: DC
  Administered 2019-08-11: 18 [IU] via SUBCUTANEOUS
  Filled 2019-08-10: qty 0.18

## 2019-08-10 NOTE — Care Management Important Message (Signed)
Important Message  Patient Details  Name: Melissa Howe MRN: [id number] Date of Birth: 26-Sep-1951   Medicare Important Message Given:  Yes     Shelda Altes 08/10/2019, 2:25 PM

## 2019-08-10 NOTE — Progress Notes (Signed)
Inpatient Diabetes Program Recommendations  AACE/ADA: New Consensus Statement on Inpatient Glycemic Control (2015)  Target Ranges:  Prepandial:   less than 140 mg/dL      Peak postprandial:   less than 180 mg/dL (1-2 hours)      Critically ill patients:  140 - 180 mg/dL   Lab Results  Component Value Date   GLUCAP 76 08/10/2019   HGBA1C 9.3 (H) 08/06/2019    Review of Glycemic Control Results for Melissa Howe, Melissa Howe (MRN [id number]) as of 08/10/2019 11:32  Ref. Range 08/09/2019 11:41 08/09/2019 16:56 08/09/2019 21:58 08/10/2019 06:16  Glucose-Capillary Latest Ref Range: 70 - 99 mg/dL 203 (H) 256 (H) 166 (H) 76   Diabetes history: DM 2 Outpatient Diabetes medications:  Levemir 36 units in the AM and Levemir 32 units in the PM Current orders for Inpatient glycemic control:  Novolog sensitive tid with meals and HS Levemir 20 units daily Inpatient Diabetes Program Recommendations:    Consider reducing Levemir to 18 units daily and add Novolog meal coverage 2 units tid with meals (hold if patient eats less than 50%).    Thanks,  Adah Perl, RN, BC-ADM Inpatient Diabetes Coordinator Pager [phone] (8a-5p)

## 2019-08-10 NOTE — Progress Notes (Signed)
PROGRESS NOTE  Melissa Howe  DOB: 06/24/1951  PCP: Javier Docker, MD [id number]  DOA: 08/05/2019  LOS: 4 days   Brief narrative: Patient is a 68 y.o. female with medical history significant of HTN, HLD, and DM who lives at home and cares for her mother with dementia.  She presented to the ED on 10/9 with complaint of shortness of breath and cough.  She had intermittent episodes of cough, wheezing and shortness of breath for last 1 month which would resolve on their own.  10/9, patient woke up with shortness of breath and it did not improve.  She also noted mild pedal edema.  Patient states she frequently misses her medications because of 'family stress.'  In the ED,no fever, blood pressure was elevated over 200 and spiked up to a maximum of 227/90.  At rest, patient is maintaining oxygen saturation more than 95% room air. Blood work showed sodium 138, potassium elevated to 6.2, BUN/creatinine 45/3.26, BNP 471, troponin slightly elevated to 21.  WBC count normal 7.4, hemoglobin 9.4. COVID-19 test negative. Chest x-ray showed mild to moderate diffuse bilateral interstitial opacities representative of interstitial edema or atypical infection. EKG showed normal sinus rhythm at 82 bpm, no ST-T wave changes, voltage criteria for LVH. Because of persistently elevated blood pressure, patient was given a dose of Lasix 80 mg IV and was started on nitroglycerin drip.  Patient was admitted under hospitalist service for further management management. Since admission, patient shortness of breath and hypertensive urgency have improved.  The biggest issue at this time is acute kidney injury.  Subjective: Patient was seen and examined this morning.  Sitting up at the edge of the bed.  Not in distress.  No new symptoms.  No shortness of breath.  Not on oxygen supplementation.  No pedal edema.  Creatinine 4.5 today, was 4.58 yesterday, seems to have plateaued.  Patient is also mostly normal range.   Blood glucose level was low at 58 this morning.  Lantus dose reduced.  Assessment/Plan: Acute hypertensive emergency Acute exacerbation of diastolic congestive heart failure Acute pulmonary edema -Presented with shortness of breath, chest discomfort.  Blood pressure elevated up to 227/90 likely precipitating acute decompensation of CHF leading to pulmonary edema and symptoms. -Patient was started on IV Lasix and nitroglycerin drip.   -Oral blood pressure medicines were initiated.  IV Lasix and nitroglycerin drip stopped. -Echocardiogram 10/10 showed EF 55 to 60% with impaired relaxation  -Home meds include Coreg, Norvasc and chlorthalidone.   -Currently on increased doses of Coreg and Norvasc.  Diuretics on hold because of AKI. -Continue to monitor blood pressure.  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  -Creatinine on admission 3.26, steadily worsened to peak at 4.58, it is 4.72 today -Nephrology consultation appreciated.  No need of urgent dialysis at this time. -Monitor for next 24 to 48 hours  Diabetes mellitus type 2 -A1c 9.3 -Home meds include Levemir 30 units twice a day. -Currently on Levemir 20 units daily and sliding scale insulin.  -Continue to monitor blood sugar.    Hyperlipidemia -Continue Crestor and aspirin. -Lipids were checked in 2/20 (TC 165, HDL 58, LDL 82, TG 126).  Hyperkalemia -Potassium was elevated to 6.2 on admission. -Improving, 5.1 today.  No changes in EKG.  Anemia of chronic disease -Transferrin saturation 12%.  IV Feraheme started by nephrology.  Mobility: Encourage ambulation DVT prophylaxis:  SCD boots.   Code Status:   Code Status: Full Code  Family Communication: Expected Discharge:  Unable to discharge home because of worsening AKI.  Needs 2-3 more days of hospital stay.  Consultants:  None  Procedures:  None  Antimicrobials: Anti-infectives (From admission, onward)   None      Diet Order             Diet heart healthy/carb modified Room service appropriate? Yes; Fluid consistency: Thin  Diet effective now              Infusions:  . sodium chloride    . nitroGLYCERIN Stopped (08/06/19 1128)    Scheduled Meds: . amLODipine  10 mg Oral Daily  . aspirin EC  81 mg Oral Daily  . carvedilol  12.5 mg Oral BID WC  . darbepoetin (ARANESP) injection - NON-DIALYSIS  100 mcg Subcutaneous Q Sun-1800  . hydrALAZINE  25 mg Oral Q8H  . insulin aspart  0-5 Units Subcutaneous QHS  . insulin aspart  0-9 Units Subcutaneous TID WC  . [START ON 08/11/2019] insulin detemir  18 Units Subcutaneous Daily  . pentoxifylline  400 mg Oral TID WC  . rosuvastatin  20 mg Oral Daily  . sodium chloride flush  3 mL Intravenous Q12H  . sodium chloride flush  3 mL Intravenous Q12H    PRN meds: sodium chloride, acetaminophen, hydrALAZINE, HYDROcodone-acetaminophen, ondansetron (ZOFRAN) IV, polyethylene glycol, sodium chloride flush   Objective: Vitals:   08/10/19 0925 08/10/19 1148  BP: (!) 153/72 135/67  Pulse: 71 64  Resp: 18 16  Temp: 97.7 F (36.5 C) 98 F (36.7 C)  SpO2: 98% 100%    Intake/Output Summary (Last 24 hours) at 08/10/2019 1539 Last data filed at 08/10/2019 1151 Gross per 24 hour  Intake 840 ml  Output 1600 ml  Net -760 ml   Filed Weights   08/08/19 0615 08/09/19 0433 08/10/19 0506  Weight: 73.3 kg 74.1 kg 74.1 kg   Weight change: 0.045 kg Body mass index is 27.61 kg/m.   Physical Exam: General exam: Appears calm and comfortable.  Skin: No rashes, lesions or ulcers. HEENT: Atraumatic, normocephalic, supple neck, no obvious bleeding Lungs: Bibasilar crackles CVS: Regular rate and rhythm, no murmur GI/Abd soft, nontender, nondistended bowel sound present CNS: Alert, awake, oriented x3 Psychiatry: Mood appropriate. Extremities: No pedal edema, no calf tenderness  Data Review: I have personally reviewed the laboratory data and studies available.  Recent Labs   Lab 08/05/19 1444 08/06/19 0041 08/08/19 0714 08/09/19 0429 08/10/19 0606  WBC 7.4 7.1 7.6 6.8 7.5  NEUTROABS 4.8 4.2 3.4 2.6 3.3  HGB 9.4* 8.7* 9.2* 8.5* 8.8*  HCT 30.1* 26.7* 29.3* 26.8* 27.0*  MCV 91.8 90.5 90.2 89.6 88.8  PLT 335 304 319 310 300   Recent Labs  Lab 08/06/19 0354 08/07/19 0909 08/08/19 0714 08/09/19 0429 08/10/19 0606  NA 138 136 138 138 138  K 5.8* 5.3* 5.1 4.6 5.1  CL 109 107 107 105 107  CO2 20* 14* 22 22 22   GLUCOSE 252* 221* 101* 58* 78  BUN 51* 51* 59* 59* 63*  CREATININE 3.66* 4.18* 4.58* 4.50* 4.72*  CALCIUM 8.9 8.3* 8.6* 8.7* 8.6*  PHOS  --   --   --  5.1* 4.7*    Terrilee Croak, MD  [facility] 08/10/2019

## 2019-08-10 NOTE — Progress Notes (Signed)
Patient ID: Melissa Howe, female   DOB: 1951-07-17, 68 y.o.   MRN: [id number] S: No complaints O:BP 135/67 (BP Location: Left Arm)   Pulse 64   Temp 98 F (36.7 C) (Oral)   Resp 16   Ht 5' 4.5" (1.638 m)   Wt 74.1 kg   SpO2 100%   BMI 27.61 kg/m   Intake/Output Summary (Last 24 hours) at 08/10/2019 1420 Last data filed at 08/10/2019 1151 Gross per 24 hour  Intake 840 ml  Output 1600 ml  Net -760 ml   Intake/Output: I/O last 3 completed shifts: In: 600 [P.O.:600] Out: 2150 [Urine:2150]  Intake/Output this shift:  Total I/O In: 240 [P.O.:240] Out: 350 [Urine:350] Weight change: 0.045 kg Gen: NAD CVS: no rub Resp: cta Abd: benign Ext: no edema  Recent Labs  Lab 08/05/19 1444 08/06/19 0354 08/07/19 0909 08/08/19 0714 08/09/19 0429 08/10/19 0606  NA 138 138 136 138 138 138  K 6.2* 5.8* 5.3* 5.1 4.6 5.1  CL 112* 109 107 107 105 107  CO2 18* 20* 14* 22 22 22   GLUCOSE 214* 252* 221* 101* 58* 78  BUN 45* 51* 51* 59* 59* 63*  CREATININE 3.26* 3.66* 4.18* 4.58* 4.50* 4.72*  ALBUMIN  --   --   --   --  2.5* 2.5*  CALCIUM 9.1 8.9 8.3* 8.6* 8.7* 8.6*  PHOS  --   --   --   --  5.1* 4.7*   Liver Function Tests: Recent Labs  Lab 08/09/19 0429 08/10/19 0606  ALBUMIN 2.5* 2.5*   No results for input(s): LIPASE, AMYLASE in the last 168 hours. No results for input(s): AMMONIA in the last 168 hours. CBC: Recent Labs  Lab 08/05/19 1444 08/06/19 0041 08/08/19 0714 08/09/19 0429 08/10/19 0606  WBC 7.4 7.1 7.6 6.8 7.5  NEUTROABS 4.8 4.2 3.4 2.6 3.3  HGB 9.4* 8.7* 9.2* 8.5* 8.8*  HCT 30.1* 26.7* 29.3* 26.8* 27.0*  MCV 91.8 90.5 90.2 89.6 88.8  PLT 335 304 319 310 300   Cardiac Enzymes: No results for input(s): CKTOTAL, CKMB, CKMBINDEX, TROPONINI in the last 168 hours. CBG: Recent Labs  Lab 08/09/19 1141 08/09/19 1656 08/09/19 2158 08/10/19 0616 08/10/19 1145  GLUCAP 203* 256* 166* 76 136*    Iron Studies:  Recent Labs    08/08/19 0714  IRON 30   TIBC 251  FERRITIN 136   Studies/Results: No results found. Marland Kitchen amLODipine  10 mg Oral Daily  . aspirin EC  81 mg Oral Daily  . carvedilol  12.5 mg Oral BID WC  . darbepoetin (ARANESP) injection - NON-DIALYSIS  100 mcg Subcutaneous Q Sun-1800  . hydrALAZINE  25 mg Oral Q8H  . insulin aspart  0-5 Units Subcutaneous QHS  . insulin aspart  0-9 Units Subcutaneous TID WC  . [START ON 08/11/2019] insulin detemir  18 Units Subcutaneous Daily  . pentoxifylline  400 mg Oral TID WC  . rosuvastatin  20 mg Oral Daily  . sodium chloride flush  3 mL Intravenous Q12H  . sodium chloride flush  3 mL Intravenous Q12H    BMET    Component Value Date/Time   NA 138 08/10/2019 0606   K 5.1 08/10/2019 0606   CL 107 08/10/2019 0606   CO2 22 08/10/2019 0606   GLUCOSE 78 08/10/2019 0606   BUN 63 (H) 08/10/2019 0606   CREATININE 4.72 (H) 08/10/2019 0606   CALCIUM 8.6 (L) 08/10/2019 0606   GFRNONAA 9 (L) 08/10/2019 0606   GFRAA  10 (L) 08/10/2019 0606   CBC    Component Value Date/Time   WBC 7.5 08/10/2019 0606   RBC 3.04 (L) 08/10/2019 0606   HGB 8.8 (L) 08/10/2019 0606   HCT 27.0 (L) 08/10/2019 0606   PLT 300 08/10/2019 0606   MCV 88.8 08/10/2019 0606   MCH 28.9 08/10/2019 0606   MCHC 32.6 08/10/2019 0606   RDW 12.8 08/10/2019 0606   LYMPHSABS 3.2 08/10/2019 0606   MONOABS 0.7 08/10/2019 0606   EOSABS 0.3 08/10/2019 0606   BASOSABS 0.0 08/10/2019 0606     Assessment/Plan:  1. AKI/CKD stage 4- with hypertensive urgency. No uremic symptoms but did discuss the severity and chronicity of her CKD. Cont to educate about the importance of medication compliance and and prepare for HD when needed.  1. Cr appeared to be reaching a plateau but up slightly today 2. No indication for HD at this time but if her Cr does not improve will consult [facility] for access placement and she is amenable to this. 2. HTN/volume- stressed compliance with meds and BP has improved. 3. Hyperkalemia- trending down.  Cont to follow 4. Anemia of CKD- TSAT 12%, repleted with IV ferahema and will start aranesp as well. 5. Disposition- hopefully her Cr will cont to remain stable/improve in the next 24-48 hours so she can be discharged with close follow up in our office.  Donetta Potts, MD [employer] [phone]

## 2019-08-11 LAB — RENAL FUNCTION PANEL
Albumin: 2.5 g/dL — ABNORMAL LOW (ref 3.5–5.0)
Anion gap: 8 (ref 5–15)
BUN: 62 mg/dL — ABNORMAL HIGH (ref 8–23)
CO2: 21 mmol/L — ABNORMAL LOW (ref 22–32)
Calcium: 8.6 mg/dL — ABNORMAL LOW (ref 8.9–10.3)
Chloride: 107 mmol/L (ref 98–111)
Creatinine, Ser: 4.7 mg/dL — ABNORMAL HIGH (ref 0.44–1.00)
GFR calc Af Amer: 10 mL/min — ABNORMAL LOW (ref >60)
GFR calc non Af Amer: 9 mL/min — ABNORMAL LOW (ref >60)
Glucose, Bld: 107 mg/dL — ABNORMAL HIGH (ref 70–99)
Phosphorus: 5 mg/dL — ABNORMAL HIGH (ref 2.5–4.6)
Potassium: 5.1 mmol/L (ref 3.5–5.1)
Sodium: 136 mmol/L (ref 135–145)

## 2019-08-11 LAB — GLUCOSE, CAPILLARY
Glucose-Capillary: 186 mg/dL — ABNORMAL HIGH (ref 70–99)
Glucose-Capillary: 87 mg/dL (ref 70–99)
Glucose-Capillary: 163 mg/dL — ABNORMAL HIGH (ref 70–99)
Glucose-Capillary: 132 mg/dL — ABNORMAL HIGH (ref 70–99)

## 2019-08-11 LAB — CBC
HCT: 26 % — ABNORMAL LOW (ref 36.0–46.0)
Hemoglobin: 8.4 g/dL — ABNORMAL LOW (ref 12.0–15.0)
MCH: 29 pg (ref 26.0–34.0)
MCHC: 32.3 g/dL (ref 30.0–36.0)
MCV: 89.7 fL (ref 80.0–100.0)
Platelets: 309 10*3/uL (ref 150–400)
RBC: 2.9 MIL/uL — ABNORMAL LOW (ref 3.87–5.11)
RDW: 12.7 % (ref 11.5–15.5)
WBC: 6.7 10*3/uL (ref 4.0–10.5)
nRBC: 0 % (ref 0.0–0.2)

## 2019-08-11 MED ORDER — CARVEDILOL 12.5 MG PO TABS: 6.2500 mg | ORAL_TABLET | Freq: Two times a day (BID) | ORAL | 0 refills | Status: DC

## 2019-08-11 MED ORDER — BISACODYL 5 MG PO TBEC: 10.0000 mg | DELAYED_RELEASE_TABLET | Freq: Every day | ORAL | Status: DC | PRN

## 2019-08-11 MED ORDER — AMLODIPINE BESYLATE 10 MG PO TABS: 10.0000 mg | ORAL_TABLET | Freq: Every day | ORAL | 0 refills | Status: DC

## 2019-08-11 MED ORDER — SENNOSIDES-DOCUSATE SODIUM 8.6-50 MG PO TABS
1.0000 | ORAL_TABLET | Freq: Two times a day (BID) | ORAL | Status: DC
  Administered 2019-08-11: 1 via ORAL
  Filled 2019-08-11: qty 1

## 2019-08-11 MED ORDER — INSULIN DETEMIR 100 UNIT/ML FLEXPEN: 18.0000 [IU] | PEN_INJECTOR | Freq: Every day | SUBCUTANEOUS | 1 refills | Status: DC

## 2019-08-11 MED ORDER — HYDRALAZINE HCL 25 MG PO TABS: 25.0000 mg | ORAL_TABLET | Freq: Three times a day (TID) | ORAL | 0 refills | Status: DC

## 2019-08-11 MED ORDER — ROSUVASTATIN CALCIUM 20 MG PO TABS: 20.0000 mg | ORAL_TABLET | Freq: Every day | ORAL | 0 refills | Status: DC

## 2019-08-11 NOTE — Discharge Summary (Signed)
Physician Discharge Summary  Yuritzi Gering Bracher [id number] DOB: 1951/07/08 DOA: 08/05/2019  PCP: Javier Docker, MD  Admit date: 08/05/2019 Discharge date: 08/11/2019  Admitted From: Home Discharge disposition: Home   Code Status: Full Code  Diet Recommendation: Cardiac diet   Recommendations for Outpatient Follow-Up:   1. Follow-up with PCP as an outpatient 2. Follow-up with nephrologist as an outpatient 3. Outpatient referral has been given for plastic surgery appointment for vein mapping.  Discharge Diagnosis:   Principal Problem:   Dyspnea Active Problems:   Medically noncompliant   Uncontrolled type 2 diabetes mellitus with hyperglycemia, with long-term current use of insulin (HCC)   Hypertensive urgency   Chronic diastolic CHF (congestive heart failure) (HCC)   Acute kidney injury superimposed on chronic kidney disease ([place])    History of Present Illness / Brief narrative:  Patientis a 68 y.o.femalewith medical history significant ofHTN, Melissa Howe, Melissa Howe who lives at home Melissa cares for her mother with dementia.  She presented to the ED on 10/9 with complaint of shortness of breath Melissa cough.  She had intermittent episodes of cough, wheezing Melissa shortness of breath for last 1 month which would resolve on their own.  10/9, patient woke up with shortness of breath Melissa it did not improve.  She also noted mild pedal edema.  Patient states she frequently misses her medications because of 'family stress.'  In the ED,no fever, blood pressure was elevated over 200 Melissa spiked up to a maximum of 227/90.  At rest, patient is maintaining oxygen saturation more than 95% room air. Blood work showed sodium 138, potassium elevated to 6.2, BUN/creatinine 45/3.26, BNP 471, troponin slightly elevated to 21.  WBC count normal 7.4, hemoglobin 9.4. COVID-19 test negative. Chest x-ray showed mild to moderate diffuse bilateral interstitial opacities representative of interstitial edema or  atypical infection. EKG showed normal sinus rhythm at 82 bpm, no ST-T wave changes, voltage criteria for LVH. Because of persistently elevated blood pressure, patient was given a dose of Lasix 80 mg IV Melissa was started on nitroglycerin drip.  Patient was admitted under hospitalist service for further management management. Since admission, patient shortness of breath Melissa hypertensive urgency have improved.  The biggest issue at this time is acute kidney injury.  Hospital Course:  Acute hypertensive emergency Acute exacerbation of diastolic congestive heart failure Acute pulmonary edema -Presented with shortness of breath, chest discomfort.  Blood pressure elevated up to 227/90 likely precipitating acute decompensation of CHF leading to pulmonary edema Melissa symptoms. -Patient was started on IV Lasix Melissa nitroglycerin drip.   -Oral blood pressure medicines were initiated.  IV Lasix Melissa nitroglycerin drip stopped. -Echocardiogram 10/10 showed EF 55 to 60% with impaired relaxation  -Home meds include Coreg, Norvasc Melissa chlorthalidone.   -Currently on increased doses of Coreg Melissa Norvasc.  Diuretics on hold because of AKI. -Continue to monitor blood pressure.  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  -Creatinine on admission 3.26, steadily worsened to peak at 4.72, it is 4.7 today. -Nephrology consultation appreciated.  No need of urgent dialysis at this time. -Per nephrology, okay to discharge to home Melissa follow-up in the office next week.  I also wrote for a referral to vascular surgery for vein mapping Melissa AV fistula/AV graft placement as an outpatient.  Diabetes mellitus type 2 -A1c 9.3 -Home meds include Levemir 30 units twice a day. -Currently on Levemir 18 units daily Melissa sliding scale insulin.   Requiring less  insulin likely because of reduced creatinine clearance. -At discharge, instructed the patient to continue 18 units daily of Lantus at home.   Continue to self monitor blood sugar at home.  Hyperlipidemia -ContinueCrestor Melissa aspirin. -Lipids were checked in2/20(TC 165, HDL58, LDL82, TG 126).  Hyperkalemia -Potassium was elevated to 6.2 on admission. -Improving, 5.1  last 3 days.  Likely related to renal failure itself.  Not on any potassium supplement or potassium sparing diuretic. No changes in EKG.  Anemia of chronic disease -Transferrin saturation 12%.  IV Feraheme started by nephrology. Stable for discharge to home today.    Subjective:  Seen Melissa examined this morning.  Pleasant elderly African-American female.  Not in distress.  Excited to go home today.  Discharge Exam:   Vitals:   08/11/19 0041 08/11/19 0430 08/11/19 0635 08/11/19 0800  BP: (!) 155/67 121/63  123/62  Pulse: 72 69  68  Resp: 20 19  14   Temp: 98.1 F (36.7 C) 97.6 F (36.4 C)  97.6 F (36.4 C)  TempSrc: Oral Oral  Oral  SpO2: 94% 100%  100%  Weight:   74.3 kg   Height:        Body mass index is 27.67 kg/m.  General exam: Appears calm Melissa comfortable.  Skin: No rashes, lesions or ulcers. HEENT: Atraumatic, normocephalic, supple neck, no obvious bleeding Lungs: Clear to auscultation bilaterally CVS: Regular rate Melissa rhythm, no murmur GI/Abd soft, nontender, nondistended, bowel sound present CNS: Alert, awake, oriented x3 Psychiatry: Mood appropriate Extremities: No pedal edema, no calf tenderness  Discharge Instructions:  Wound care: None Discharge Instructions    Ambulatory referral to Vascular Surgery   Complete by: As directed    Needs vein mapping for AV fistula/graft   Diet - low sodium heart healthy   Complete by: As directed    Increase activity slowly   Complete by: As directed       Allergies as of 08/11/2019   No Known Allergies     Medication List    STOP taking these medications   chlorthalidone 25 MG tablet Commonly known as: HYGROTON     TAKE these medications   amLODipine 10 MG tablet  Commonly known as: NORVASC Take 1 tablet (10 mg total) by mouth daily. What changed:   medication strength  how much to take   aspirin EC 81 MG tablet Take 1 tablet (81 mg total) by mouth daily.   carvedilol 12.5 MG tablet Commonly known as: COREG Take 0.5 tablets (6.25 mg total) by mouth 2 (two) times daily with a meal. What changed: medication strength   hydrALAZINE 25 MG tablet Commonly known as: APRESOLINE Take 1 tablet (25 mg total) by mouth 3 (three) times daily.   HYDROcodone-acetaminophen 5-325 MG tablet Commonly known as: NORCO/VICODIN Take 1-2 tablets by mouth every 4 (four) hours as needed for moderate pain (pain score 4-6).   Insulin Detemir 100 UNIT/ML Pen Commonly known as: LEVEMIR Inject 18 Units into the skin daily with breakfast. What changed: how much to take   Insulin Pen Needle 32G X 4 MM Misc Use with insulin pen to dispense insulin as directed   pentoxifylline 400 MG CR tablet Commonly known as: TRENTAL Take 1 tablet (400 mg total) by mouth 3 (three) times daily with meals.   rosuvastatin 20 MG tablet Commonly known as: CRESTOR Take 1 tablet (20 mg total) by mouth daily.       Time coordinating discharge: 35 minutes  The results of significant diagnostics  from this hospitalization (including imaging, microbiology, ancillary Melissa laboratory) are listed below for reference.    Procedures Melissa Diagnostic Studies:   Dg Chest Portable 1 View  Result Date: 08/05/2019 CLINICAL DATA:  Chest pain Melissa shortness of breath for 1-2 weeks EXAM: PORTABLE CHEST 1 VIEW COMPARISON:  Chest radiograph dated 02/14/2018 FINDINGS: The heart is borderline enlarged. Mild to moderate diffuse bilateral interstitial opacities are noted. There is no pleural effusion or pneumothorax. The osseous structures are unremarkable. IMPRESSION: Mild to moderate diffuse bilateral interstitial opacities. This could represent interstitial edema or atypical infection. Electronically  Signed   By: Zerita Boers M.D.   On: 08/05/2019 12:46     Labs:   Basic Metabolic Panel: Recent Labs  Lab 08/07/19 0909 08/08/19 0714 08/09/19 0429 08/10/19 0606 08/11/19 0451  NA 136 138 138 138 136  K 5.3* 5.1 4.6 5.1 5.1  CL 107 107 105 107 107  CO2 14* 22 22 22  21*  GLUCOSE 221* 101* 58* 78 107*  BUN 51* 59* 59* 63* 62*  CREATININE 4.18* 4.58* 4.50* 4.72* 4.70*  CALCIUM 8.3* 8.6* 8.7* 8.6* 8.6*  PHOS  --   --  5.1* 4.7* 5.0*   GFR Estimated Creatinine Clearance: 11.3 mL/min (A) (by C-G formula based on SCr of 4.7 mg/dL (H)). Liver Function Tests: Recent Labs  Lab 08/09/19 0429 08/10/19 0606 08/11/19 0451  ALBUMIN 2.5* 2.5* 2.5*   No results for input(s): LIPASE, AMYLASE in the last 168 hours. No results for input(s): AMMONIA in the last 168 hours. Coagulation profile No results for input(s): INR, PROTIME in the last 168 hours.  CBC: Recent Labs  Lab 08/05/19 1444 08/06/19 0041 08/08/19 0714 08/09/19 0429 08/10/19 0606 08/11/19 0451  WBC 7.4 7.1 7.6 6.8 7.5 6.7  NEUTROABS 4.8 4.2 3.4 2.6 3.3  --   HGB 9.4* 8.7* 9.2* 8.5* 8.8* 8.4*  HCT 30.1* 26.7* 29.3* 26.8* 27.0* 26.0*  MCV 91.8 90.5 90.2 89.6 88.8 89.7  PLT 335 304 319 310 300 309   Cardiac Enzymes: No results for input(s): CKTOTAL, CKMB, CKMBINDEX, TROPONINI in the last 168 hours. BNP: Invalid input(s): POCBNP CBG: Recent Labs  Lab 08/10/19 1732 08/10/19 2144 08/11/19 0547 08/11/19 0821 08/11/19 1149  GLUCAP 129* 186* 87 163* 132*   D-Dimer No results for input(s): DDIMER in the last 72 hours. Hgb A1c No results for input(s): HGBA1C in the last 72 hours. Lipid Profile No results for input(s): CHOL, HDL, LDLCALC, TRIG, CHOLHDL, LDLDIRECT in the last 72 hours. Thyroid function studies No results for input(s): TSH, T4TOTAL, T3FREE, THYROIDAB in the last 72 hours.  Invalid input(s): FREET3 Anemia work up No results for input(s): VITAMINB12, FOLATE, FERRITIN, TIBC, IRON, RETICCTPCT in  the last 72 hours. Microbiology Recent Results (from the past 240 hour(s))  SARS CORONAVIRUS 2 (TAT 6-24 HRS) Nasopharyngeal Nasopharyngeal Swab     Status: None   Collection Time: 08/05/19  9:43 PM   Specimen: Nasopharyngeal Swab  Result Value Ref Range Status   SARS Coronavirus 2 NEGATIVE NEGATIVE Final    Comment: (NOTE) SARS-CoV-2 target nucleic acids are NOT DETECTED. The SARS-CoV-2 RNA is generally detectable in upper Melissa lower respiratory specimens during the acute phase of infection. Negative results do not preclude SARS-CoV-2 infection, do not rule out co-infections with other pathogens, Melissa should not be used as the sole basis for treatment or other patient management decisions. Negative results must be combined with clinical observations, patient history, Melissa epidemiological information. The expected result is Negative. Fact  Sheet for Patients: SugarRoll.be Fact Sheet for Healthcare Providers: https://www.woods-mathews.com/ This test is not yet approved or cleared by the [place] FDA Melissa  has been authorized for detection Melissa/or diagnosis of SARS-CoV-2 by FDA under an Emergency Use Authorization (EUA). This EUA will remain  in effect (meaning this test can be used) for the duration of the COVID-19 declaration under Section 56 4(b)(1) of the Act, 21 U.S.C. section 360bbb-3(b)(1), unless the authorization is terminated or revoked sooner. Performed at [facility], [place] [address], [place], [place] [address]     Please note: You were cared for by a hospitalist during your hospital stay. Once you are discharged, your primary care physician will handle any further medical issues. Please note that NO REFILLS for any discharge medications will be authorized once you are discharged, as it is imperative that you return to your primary care physician (or establish a relationship with a primary care physician if you do not have  one) for your post hospital discharge needs so that they can reassess your need for medications Melissa monitor your lab values.  Signed: Marlowe Aschoff Aubrianna Orchard  [facility] 08/11/2019, 11:58 AM

## 2019-08-11 NOTE — Progress Notes (Signed)
Patient ID: Lizmarie Biermann, female   DOB: [birth year], 68 y.o.   MRN: [id number] S: Feels well, no n/v/d.  Wants to go home. O:BP 123/62 (BP Location: Left Arm)   Pulse 68   Temp 97.6 F (36.4 C) (Oral)   Resp 14   Ht 5' 4.5" (1.638 m)   Wt 74.3 kg   SpO2 100%   BMI 27.67 kg/m   Intake/Output Summary (Last 24 hours) at 08/11/2019 1039 Last data filed at 08/11/2019 0436 Gross per 24 hour  Intake 120 ml  Output 1450 ml  Net -1330 ml   Intake/Output: I/O last 3 completed shifts: In: 720 [P.O.:720] Out: 2700 [Urine:2700]  Intake/Output this shift:  No intake/output data recorded. Weight change: 0.136 kg Gen: NAD CVS: no rub Resp: CTA  Abd: +BS, soft, NT/ND Ext: no edema  Recent Labs  Lab 08/05/19 1444 08/06/19 0354 08/07/19 0909 08/08/19 0714 08/09/19 0429 08/10/19 0606 08/11/19 0451  NA 138 138 136 138 138 138 136  K 6.2* 5.8* 5.3* 5.1 4.6 5.1 5.1  CL 112* 109 107 107 105 107 107  CO2 18* 20* 14* 22 22 22  21*  GLUCOSE 214* 252* 221* 101* 58* 78 107*  BUN 45* 51* 51* 59* 59* 63* 62*  CREATININE 3.26* 3.66* 4.18* 4.58* 4.50* 4.72* 4.70*  ALBUMIN  --   --   --   --  2.5* 2.5* 2.5*  CALCIUM 9.1 8.9 8.3* 8.6* 8.7* 8.6* 8.6*  PHOS  --   --   --   --  5.1* 4.7* 5.0*   Liver Function Tests: Recent Labs  Lab 08/09/19 0429 08/10/19 0606 08/11/19 0451  ALBUMIN 2.5* 2.5* 2.5*   No results for input(s): LIPASE, AMYLASE in the last 168 hours. No results for input(s): AMMONIA in the last 168 hours. CBC: Recent Labs  Lab 08/06/19 0041 08/08/19 0714 08/09/19 0429 08/10/19 0606 08/11/19 0451  WBC 7.1 7.6 6.8 7.5 6.7  NEUTROABS 4.2 3.4 2.6 3.3  --   HGB 8.7* 9.2* 8.5* 8.8* 8.4*  HCT 26.7* 29.3* 26.8* 27.0* 26.0*  MCV 90.5 90.2 89.6 88.8 89.7  PLT 304 319 310 300 309   Cardiac Enzymes: No results for input(s): CKTOTAL, CKMB, CKMBINDEX, TROPONINI in the last 168 hours. CBG: Recent Labs  Lab 08/10/19 1145 08/10/19 1732 08/10/19 2144 08/11/19 0547  08/11/19 0821  GLUCAP 136* 129* 186* 87 163*    Iron Studies: No results for input(s): IRON, TIBC, TRANSFERRIN, FERRITIN in the last 72 hours. Studies/Results: No results found. Marland Kitchen amLODipine  10 mg Oral Daily  . aspirin EC  81 mg Oral Daily  . carvedilol  12.5 mg Oral BID WC  . darbepoetin (ARANESP) injection - NON-DIALYSIS  100 mcg Subcutaneous Q Sun-1800  . hydrALAZINE  25 mg Oral Q8H  . insulin aspart  0-5 Units Subcutaneous QHS  . insulin aspart  0-9 Units Subcutaneous TID WC  . insulin detemir  18 Units Subcutaneous Daily  . pentoxifylline  400 mg Oral TID WC  . rosuvastatin  20 mg Oral Daily  . senna-docusate  1 tablet Oral BID  . sodium chloride flush  3 mL Intravenous Q12H  . sodium chloride flush  3 mL Intravenous Q12H    BMET    Component Value Date/Time   NA 136 08/11/2019 0451   K 5.1 08/11/2019 0451   CL 107 08/11/2019 0451   CO2 21 (L) 08/11/2019 0451   GLUCOSE 107 (H) 08/11/2019 0451   BUN 62 (H) 08/11/2019 0451  CREATININE 4.70 (H) 08/11/2019 0451   CALCIUM 8.6 (L) 08/11/2019 0451   GFRNONAA 9 (L) 08/11/2019 0451   GFRAA 10 (L) 08/11/2019 0451   CBC    Component Value Date/Time   WBC 6.7 08/11/2019 0451   RBC 2.90 (L) 08/11/2019 0451   HGB 8.4 (L) 08/11/2019 0451   HCT 26.0 (L) 08/11/2019 0451   PLT 309 08/11/2019 0451   MCV 89.7 08/11/2019 0451   MCH 29.0 08/11/2019 0451   MCHC 32.3 08/11/2019 0451   RDW 12.7 08/11/2019 0451   LYMPHSABS 3.2 08/10/2019 0606   MONOABS 0.7 08/10/2019 0606   EOSABS 0.3 08/10/2019 0606   BASOSABS 0.0 08/10/2019 0606    Assessment/Plan:  1. AKI/CKD stage 4- with hypertensive urgency. No uremic symptoms but did discuss the severity and chronicity of her CKD. Cont to educateabout the importance of medication compliance andand prepare for HD when needed. 1. Cr has finally reached a plateau. 2. No indication for HD at this time as she is completely asymptomatic 3. Ok for discharge to home with follow up in our  office next week and a referral to [facility] for vein mapping and AVF/AVG placement as an outpatient. 4. Stressed importance of compliance with medications and medical follow up. 2. HTN/volume- stressed compliance with meds and BP has improved. 3. Hyperkalemia- trending down. Cont to follow 4. Anemia of CKD- TSAT 12%, repleted with IV ferahema and will start aranesp as well. 5. Disposition- stable for discharge and will have her follow up in our office next week.  Donetta Potts, MD [employer] [phone]

## 2019-08-13 ENCOUNTER — Other Ambulatory Visit: Payer: Self-pay

## 2019-08-13 ENCOUNTER — Emergency Department (HOSPITAL_COMMUNITY): Payer: Medicare Other

## 2019-08-13 ENCOUNTER — Observation Stay (HOSPITAL_COMMUNITY)
Admission: EM | Admit: 2019-08-13 | Discharge: 2019-08-13 | Disposition: A | Discharge status: Home / Self Care | Payer: Medicare Other | Attending: Internal Medicine | Admitting: Internal Medicine

## 2019-08-13 DIAGNOSIS — Z79899 Other long term (current) drug therapy: Secondary | ICD-10-CM | POA: Insufficient documentation

## 2019-08-13 DIAGNOSIS — E162 Hypoglycemia, unspecified: Secondary | ICD-10-CM

## 2019-08-13 DIAGNOSIS — Z833 Family history of diabetes mellitus: Secondary | ICD-10-CM | POA: Insufficient documentation

## 2019-08-13 DIAGNOSIS — E160 Drug-induced hypoglycemia without coma: Secondary | ICD-10-CM | POA: Diagnosis not present

## 2019-08-13 DIAGNOSIS — I10 Essential (primary) hypertension: Secondary | ICD-10-CM

## 2019-08-13 DIAGNOSIS — Z87891 Personal history of nicotine dependence: Secondary | ICD-10-CM | POA: Insufficient documentation

## 2019-08-13 DIAGNOSIS — Z794 Long term (current) use of insulin: Secondary | ICD-10-CM | POA: Insufficient documentation

## 2019-08-13 DIAGNOSIS — I5032 Chronic diastolic (congestive) heart failure: Secondary | ICD-10-CM | POA: Diagnosis not present

## 2019-08-13 DIAGNOSIS — E11649 Type 2 diabetes mellitus with hypoglycemia without coma: Principal | ICD-10-CM | POA: Insufficient documentation

## 2019-08-13 DIAGNOSIS — H538 Other visual disturbances: Secondary | ICD-10-CM | POA: Insufficient documentation

## 2019-08-13 DIAGNOSIS — I130 Hypertensive heart and chronic kidney disease with heart failure and stage 1 through stage 4 chronic kidney disease, or unspecified chronic kidney disease: Secondary | ICD-10-CM | POA: Insufficient documentation

## 2019-08-13 DIAGNOSIS — Z8249 Family history of ischemic heart disease and other diseases of the circulatory system: Secondary | ICD-10-CM | POA: Diagnosis not present

## 2019-08-13 DIAGNOSIS — T68XXXA Hypothermia, initial encounter: Secondary | ICD-10-CM

## 2019-08-13 DIAGNOSIS — E785 Hyperlipidemia, unspecified: Secondary | ICD-10-CM | POA: Diagnosis not present

## 2019-08-13 DIAGNOSIS — E1122 Type 2 diabetes mellitus with diabetic chronic kidney disease: Secondary | ICD-10-CM | POA: Insufficient documentation

## 2019-08-13 DIAGNOSIS — H269 Unspecified cataract: Secondary | ICD-10-CM | POA: Insufficient documentation

## 2019-08-13 DIAGNOSIS — T383X5A Adverse effect of insulin and oral hypoglycemic [antidiabetic] drugs, initial encounter: Secondary | ICD-10-CM | POA: Insufficient documentation

## 2019-08-13 DIAGNOSIS — N184 Chronic kidney disease, stage 4 (severe): Secondary | ICD-10-CM | POA: Insufficient documentation

## 2019-08-13 DIAGNOSIS — E1136 Type 2 diabetes mellitus with diabetic cataract: Secondary | ICD-10-CM | POA: Diagnosis not present

## 2019-08-13 DIAGNOSIS — Z20828 Contact with and (suspected) exposure to other viral communicable diseases: Secondary | ICD-10-CM | POA: Diagnosis not present

## 2019-08-13 DIAGNOSIS — E1165 Type 2 diabetes mellitus with hyperglycemia: Secondary | ICD-10-CM

## 2019-08-13 DIAGNOSIS — Z7982 Long term (current) use of aspirin: Secondary | ICD-10-CM | POA: Diagnosis not present

## 2019-08-13 LAB — COMPREHENSIVE METABOLIC PANEL
ALT: 13 U/L (ref 0–44)
AST: 17 U/L (ref 15–41)
Albumin: 3.5 g/dL (ref 3.5–5.0)
Alkaline Phosphatase: 84 U/L (ref 38–126)
Anion gap: 12 (ref 5–15)
BUN: 63 mg/dL — ABNORMAL HIGH (ref 8–23)
CO2: 21 mmol/L — ABNORMAL LOW (ref 22–32)
Calcium: 9.4 mg/dL (ref 8.9–10.3)
Chloride: 105 mmol/L (ref 98–111)
Creatinine, Ser: 4.7 mg/dL — ABNORMAL HIGH (ref 0.44–1.00)
GFR calc Af Amer: 10 mL/min — ABNORMAL LOW (ref >60)
GFR calc non Af Amer: 9 mL/min — ABNORMAL LOW (ref >60)
Glucose, Bld: 75 mg/dL (ref 70–99)
Potassium: 4.6 mmol/L (ref 3.5–5.1)
Sodium: 138 mmol/L (ref 135–145)
Total Bilirubin: 0.4 mg/dL (ref 0.3–1.2)
Total Protein: 6.9 g/dL (ref 6.5–8.1)

## 2019-08-13 LAB — CBC WITH DIFFERENTIAL/PLATELET
Abs Immature Granulocytes: 0.02 10*3/uL (ref 0.00–0.07)
Basophils Absolute: 0 10*3/uL (ref 0.0–0.1)
Basophils Relative: 0 %
Eosinophils Absolute: 0.2 10*3/uL (ref 0.0–0.5)
Eosinophils Relative: 2 %
HCT: 31.4 % — ABNORMAL LOW (ref 36.0–46.0)
Hemoglobin: 10 g/dL — ABNORMAL LOW (ref 12.0–15.0)
Immature Granulocytes: 0 %
Lymphocytes Relative: 26 %
Lymphs Abs: 2.3 10*3/uL (ref 0.7–4.0)
MCH: 28.8 pg (ref 26.0–34.0)
MCHC: 31.8 g/dL (ref 30.0–36.0)
MCV: 90.5 fL (ref 80.0–100.0)
Monocytes Absolute: 0.6 10*3/uL (ref 0.1–1.0)
Monocytes Relative: 6 %
Neutro Abs: 6 10*3/uL (ref 1.7–7.7)
Neutrophils Relative %: 66 %
Platelets: 373 10*3/uL (ref 150–400)
RBC: 3.47 MIL/uL — ABNORMAL LOW (ref 3.87–5.11)
RDW: 13 % (ref 11.5–15.5)
WBC: 9.1 10*3/uL (ref 4.0–10.5)
nRBC: 0 % (ref 0.0–0.2)

## 2019-08-13 LAB — MRSA PCR SCREENING: MRSA by PCR: NEGATIVE

## 2019-08-13 LAB — URINALYSIS, ROUTINE W REFLEX MICROSCOPIC
Bilirubin Urine: NEGATIVE
Glucose, UA: NEGATIVE mg/dL
Ketones, ur: NEGATIVE mg/dL
Nitrite: NEGATIVE
Protein, ur: >=300 mg/dL — AB
Specific Gravity, Urine: 1.011 (ref 1.005–1.030)
WBC, UA: >50 WBC/hpf — ABNORMAL HIGH (ref 0–5)
pH: 7 (ref 5.0–8.0)

## 2019-08-13 LAB — BASIC METABOLIC PANEL
Anion gap: 9 (ref 5–15)
BUN: 60 mg/dL — ABNORMAL HIGH (ref 8–23)
CO2: 21 mmol/L — ABNORMAL LOW (ref 22–32)
Calcium: 8.5 mg/dL — ABNORMAL LOW (ref 8.9–10.3)
Chloride: 106 mmol/L (ref 98–111)
Creatinine, Ser: 4.55 mg/dL — ABNORMAL HIGH (ref 0.44–1.00)
GFR calc Af Amer: 11 mL/min — ABNORMAL LOW (ref >60)
GFR calc non Af Amer: 9 mL/min — ABNORMAL LOW (ref >60)
Glucose, Bld: 100 mg/dL — ABNORMAL HIGH (ref 70–99)
Potassium: 5 mmol/L (ref 3.5–5.1)
Sodium: 136 mmol/L (ref 135–145)

## 2019-08-13 LAB — GLUCOSE, CAPILLARY
Glucose-Capillary: 73 mg/dL (ref 70–99)
Glucose-Capillary: 130 mg/dL — ABNORMAL HIGH (ref 70–99)
Glucose-Capillary: 125 mg/dL — ABNORMAL HIGH (ref 70–99)
Glucose-Capillary: 106 mg/dL — ABNORMAL HIGH (ref 70–99)
Glucose-Capillary: 129 mg/dL — ABNORMAL HIGH (ref 70–99)

## 2019-08-13 LAB — LACTIC ACID, PLASMA
Lactic Acid, Venous: 1.1 mmol/L (ref 0.5–1.9)
Lactic Acid, Venous: 0.6 mmol/L (ref 0.5–1.9)

## 2019-08-13 LAB — CBG MONITORING, ED
Glucose-Capillary: 73 mg/dL (ref 70–99)
Glucose-Capillary: 80 mg/dL (ref 70–99)
Glucose-Capillary: 65 mg/dL — ABNORMAL LOW (ref 70–99)
Glucose-Capillary: 108 mg/dL — ABNORMAL HIGH (ref 70–99)
Glucose-Capillary: 85 mg/dL (ref 70–99)

## 2019-08-13 LAB — SARS CORONAVIRUS 2 (TAT 6-24 HRS): SARS Coronavirus 2: NEGATIVE

## 2019-08-13 MED ORDER — DEXTROSE 50 % IV SOLN
25.0000 mL | Freq: Once | INTRAVENOUS | Status: AC
  Administered 2019-08-13: 25 mL via INTRAVENOUS
  Filled 2019-08-13: qty 50

## 2019-08-13 MED ORDER — HYDROCODONE-ACETAMINOPHEN 5-325 MG PO TABS: 1.0000 | ORAL_TABLET | ORAL | Status: DC | PRN

## 2019-08-13 MED ORDER — HYDRALAZINE HCL 25 MG PO TABS
25.0000 mg | ORAL_TABLET | Freq: Three times a day (TID) | ORAL | Status: DC
  Administered 2019-08-13: 25 mg via ORAL
  Filled 2019-08-13: qty 1

## 2019-08-13 MED ORDER — CHLORHEXIDINE GLUCONATE CLOTH 2 % EX PADS
6.0000 | MEDICATED_PAD | Freq: Every day | CUTANEOUS | Status: DC
  Administered 2019-08-13: 6 via TOPICAL

## 2019-08-13 MED ORDER — AMLODIPINE BESYLATE 5 MG PO TABS
10.0000 mg | ORAL_TABLET | Freq: Every day | ORAL | Status: DC
  Administered 2019-08-13: 10 mg via ORAL
  Filled 2019-08-13: qty 2

## 2019-08-13 MED ORDER — ROSUVASTATIN CALCIUM 20 MG PO TABS
20.0000 mg | ORAL_TABLET | Freq: Every day | ORAL | Status: DC
  Administered 2019-08-13: 20 mg via ORAL
  Filled 2019-08-13 (×2): qty 1

## 2019-08-13 MED ORDER — ACETAMINOPHEN 650 MG RE SUPP: 650.0000 mg | Freq: Four times a day (QID) | RECTAL | Status: DC | PRN

## 2019-08-13 MED ORDER — ONDANSETRON HCL 4 MG PO TABS: 4.0000 mg | ORAL_TABLET | Freq: Four times a day (QID) | ORAL | Status: DC | PRN

## 2019-08-13 MED ORDER — CARVEDILOL 6.25 MG PO TABS
6.2500 mg | ORAL_TABLET | Freq: Two times a day (BID) | ORAL | Status: DC
  Administered 2019-08-13: 6.25 mg via ORAL
  Filled 2019-08-13 (×2): qty 1

## 2019-08-13 MED ORDER — HEPARIN SODIUM (PORCINE) 5000 UNIT/ML IJ SOLN
5000.0000 [IU] | Freq: Three times a day (TID) | INTRAMUSCULAR | Status: DC
  Administered 2019-08-13: 5000 [IU] via SUBCUTANEOUS
  Filled 2019-08-13: qty 1

## 2019-08-13 MED ORDER — ACETAMINOPHEN 325 MG PO TABS: 650.0000 mg | ORAL_TABLET | Freq: Four times a day (QID) | ORAL | Status: DC | PRN

## 2019-08-13 MED ORDER — PENTOXIFYLLINE ER 400 MG PO TBCR
400.0000 mg | EXTENDED_RELEASE_TABLET | Freq: Three times a day (TID) | ORAL | Status: DC
  Administered 2019-08-13 (×2): 400 mg via ORAL
  Filled 2019-08-13 (×3): qty 1

## 2019-08-13 MED ORDER — ASPIRIN EC 81 MG PO TBEC
81.0000 mg | DELAYED_RELEASE_TABLET | Freq: Every day | ORAL | Status: DC
  Administered 2019-08-13: 81 mg via ORAL
  Filled 2019-08-13: qty 1

## 2019-08-13 MED ORDER — ONDANSETRON HCL 4 MG/2ML IJ SOLN: 4.0000 mg | Freq: Four times a day (QID) | INTRAMUSCULAR | Status: DC | PRN

## 2019-08-13 NOTE — Progress Notes (Signed)
Patient stable with no s/s of distress. Discharge information & instructions reviewed with patient and patient's daughter, questions answered. Patient DC home with daughter. [place], Ardeth Sportsman

## 2019-08-13 NOTE — H&P (Signed)
History and Physical    Melissa Howe [id number] DOB: 1951/07/31 DOA: 08/13/2019  PCP: Melissa Docker, MD  Patient coming from: Home  I have personally briefly reviewed patient's old medical records in [place]  Chief Complaint: Hypoglycemia  HPI: Melissa Howe is a 68 y.o. female with medical history significant of DM2, HTN, chronic diastolic CHF, CKD stage 4-5 now following admit from 10/9 to 10/15 for hypertensive urgency and pulmonary edema.  Patient (who previously wasn't very compliant with meds), thought she was supposed to be still taking BID lantus (it had instead been changed to 18u once daily only), specifically she took 36u in AM 10/16 and 32u in PM 10/16.  Patient lethargic today, EMS called, CBG was reportedly 51 at home.  Given 15g of D10 by EMS, CBG subsequently 112.   ED Course: CXR shows mild pulm edema findings again.  T was initially 93.7, bair hugger applied for re-warming.  BP has been running 0000000 systolic.   Review of Systems: As per HPI, otherwise all review of systems negative.  Past Medical History:  Diagnosis Date  . Complication of anesthesia   . Constipation   . Diabetes mellitus    Type II  . Dyspnea   . Hyperlipemia   . Hypertension   . PONV (postoperative nausea and vomiting)     Past Surgical History:  Procedure Laterality Date  . APPENDECTOMY    . EYE SURGERY Bilateral    Cataract  . FOREIGN BODY REMOVAL Left 05/13/2018   Procedure: FOREIGN BODY REMOVAL left foot;  Surgeon: Melissa Pel, MD;  Location: [place];  Service: Orthopedics;  Laterality: Left;  . I&D EXTREMITY Left 05/21/2018   Procedure: LEFT FOOT DEBRIDEMENT AND WOUND CLOSURE;  Surgeon: Melissa Minion, MD;  Location: [place];  Service: Orthopedics;  Laterality: Left;  . TONSILLECTOMY    . TUBAL LIGATION       reports that she quit smoking about 35 years ago. Her smoking use included cigars. She quit after 4.00 years of use. She has never used  smokeless tobacco. She reports that she does not drink alcohol or use drugs.  No Known Allergies  Family History  Problem Relation Age of Onset  . Hypertension Mother   . Diabetes Mother   . Hypertension Sister      Prior to Admission medications   Medication Sig Start Date End Date Taking? Authorizing Provider  amLODipine (NORVASC) 10 MG tablet Take 1 tablet (10 mg total) by mouth daily. 08/11/19 09/10/19 Yes Dahal, Marlowe Aschoff, MD  aspirin EC 81 MG tablet Take 1 tablet (81 mg total) by mouth daily. 09/08/16  Yes Tat, Shanon Brow, MD  carvedilol (COREG) 12.5 MG tablet Take 0.5 tablets (6.25 mg total) by mouth 2 (two) times daily with a meal. 08/11/19 09/10/19 Yes Dahal, Marlowe Aschoff, MD  hydrALAZINE (APRESOLINE) 25 MG tablet Take 1 tablet (25 mg total) by mouth 3 (three) times daily. 08/11/19 09/10/19 Yes Dahal, Marlowe Aschoff, MD  HYDROcodone-acetaminophen (NORCO/VICODIN) 5-325 MG tablet Take 1-2 tablets by mouth every 4 (four) hours as needed for moderate pain (pain score 4-6). 05/14/18  Yes Danford, Suann Larry, MD  Insulin Detemir (LEVEMIR) 100 UNIT/ML Pen Inject 18 Units into the skin daily with breakfast. 08/11/19  Yes Dahal, Marlowe Aschoff, MD  pentoxifylline (TRENTAL) 400 MG CR tablet Take 1 tablet (400 mg total) by mouth 3 (three) times daily with meals. 06/10/18  Yes Melissa Minion, MD  rosuvastatin (CRESTOR) 20 MG tablet Take 1 tablet (20  mg total) by mouth daily. 08/11/19 09/10/19 Yes Dahal, Marlowe Aschoff, MD  Insulin Pen Needle 32G X 4 MM MISC Use with insulin pen to dispense insulin as directed 09/08/16   Melissa Eva, MD    Physical Exam: Vitals:   08/13/19 0121 08/13/19 0141 08/13/19 0215 08/13/19 0300  BP:   (!) 143/68 (!) 145/82  Pulse:   (!) 59 67  Resp:   (!) 22 19  Temp:  (!) 93.7 F (34.3 C)    TempSrc:  Rectal    SpO2:   100% 100%  Weight: 73.9 kg     Height: 5\' 1"  (1.549 m)       Constitutional: NAD, calm, comfortable Eyes: PERRL, lids and conjunctivae normal ENMT: Mucous membranes are moist.  Posterior pharynx clear of any exudate or lesions.Normal dentition.  Neck: normal, supple, no masses, no thyromegaly Respiratory: clear to auscultation bilaterally, no wheezing, no crackles. Normal respiratory effort. No accessory muscle use.  Cardiovascular: Regular rate and rhythm, no murmurs / rubs / gallops. No extremity edema. 2+ pedal pulses. No carotid bruits.  Abdomen: no tenderness, no masses palpated. No hepatosplenomegaly. Bowel sounds positive.  Musculoskeletal: no clubbing / cyanosis. No joint deformity upper and lower extremities. Good ROM, no contractures. Normal muscle tone.  Skin: no rashes, lesions, ulcers. No induration Neurologic: CN 2-12 grossly intact. Sensation intact, DTR normal. Strength 5/5 in all 4.  Psychiatric: Normal judgment and insight. Alert and oriented x 3. Normal mood.    Labs on Admission: I have personally reviewed following labs and imaging studies  CBC: Recent Labs  Lab 08/08/19 0714 08/09/19 0429 08/10/19 0606 08/11/19 0451 08/13/19 0200  WBC 7.6 6.8 7.5 6.7 9.1  NEUTROABS 3.4 2.6 3.3  --  6.0  HGB 9.2* 8.5* 8.8* 8.4* 10.0*  HCT 29.3* 26.8* 27.0* 26.0* 31.4*  MCV 90.2 89.6 88.8 89.7 90.5  PLT 319 310 300 309 [id number]   Basic Metabolic Panel: Recent Labs  Lab 08/08/19 0714 08/09/19 0429 08/10/19 0606 08/11/19 0451 08/13/19 0200  NA 138 138 138 136 138  K 5.1 4.6 5.1 5.1 4.6  CL 107 105 107 107 105  CO2 22 22 22  21* 21*  GLUCOSE 101* 58* 78 107* 75  BUN 59* 59* 63* 62* 63*  CREATININE 4.58* 4.50* 4.72* 4.70* 4.70*  CALCIUM 8.6* 8.7* 8.6* 8.6* 9.4  PHOS  --  5.1* 4.7* 5.0*  --    GFR: Estimated Creatinine Clearance: 10.4 mL/min (A) (by C-G formula based on SCr of 4.7 mg/dL (H)). Liver Function Tests: Recent Labs  Lab 08/09/19 0429 08/10/19 0606 08/11/19 0451 08/13/19 0200  AST  --   --   --  17  ALT  --   --   --  13  ALKPHOS  --   --   --  84  BILITOT  --   --   --  0.4  PROT  --   --   --  6.9  ALBUMIN 2.5* 2.5* 2.5* 3.5    No results for input(s): LIPASE, AMYLASE in the last 168 hours. No results for input(s): AMMONIA in the last 168 hours. Coagulation Profile: No results for input(s): INR, PROTIME in the last 168 hours. Cardiac Enzymes: No results for input(s): CKTOTAL, CKMB, CKMBINDEX, TROPONINI in the last 168 hours. BNP (last 3 results) No results for input(s): PROBNP in the last 8760 hours. HbA1C: No results for input(s): HGBA1C in the last 72 hours. CBG: Recent Labs  Lab 08/11/19 0547 08/11/19 0821 08/11/19 1149  08/13/19 0115 08/13/19 0214  GLUCAP 87 163* 132* 73 80   Lipid Profile: No results for input(s): CHOL, HDL, LDLCALC, TRIG, CHOLHDL, LDLDIRECT in the last 72 hours. Thyroid Function Tests: No results for input(s): TSH, T4TOTAL, FREET4, T3FREE, THYROIDAB in the last 72 hours. Anemia Panel: No results for input(s): VITAMINB12, FOLATE, FERRITIN, TIBC, IRON, RETICCTPCT in the last 72 hours. Urine analysis:    Component Value Date/Time   COLORURINE YELLOW 08/13/2019 0302   APPEARANCEUR CLOUDY (A) 08/13/2019 0302   LABSPEC 1.011 08/13/2019 0302   PHURINE 7.0 08/13/2019 0302   GLUCOSEU NEGATIVE 08/13/2019 0302   HGBUR SMALL (A) 08/13/2019 0302   BILIRUBINUR NEGATIVE 08/13/2019 0302   KETONESUR NEGATIVE 08/13/2019 0302   PROTEINUR >=300 (A) 08/13/2019 0302   UROBILINOGEN 1.0 07/13/2015 1543   NITRITE NEGATIVE 08/13/2019 0302   LEUKOCYTESUR LARGE (A) 08/13/2019 0302    Radiological Exams on Admission: Dg Chest Portable 1 View  Result Date: 08/13/2019 CLINICAL DATA:  Hypothermic, low blood sugar and lethargy EXAM: PORTABLE CHEST 1 VIEW COMPARISON:  Radiograph 08/05/2019 FINDINGS: There is some mild basilar airways thickening and interstitial opacity. No pneumothorax or effusion. Cardiomediastinal contours are stable. No acute osseous or soft tissue abnormality. IMPRESSION: Mild basilar airways thickening and interstitial opacity, suspicious for atypical infection/viral infection.  Electronically Signed   By: Lovena Le M.D.   On: 08/13/2019 02:09    EKG: Independently reviewed.  Assessment/Plan Principal Problem:   Hypoglycemia due to insulin Active Problems:   Essential hypertension   Uncontrolled type 2 diabetes mellitus with hyperglycemia, with long-term current use of insulin (HCC)   Chronic diastolic CHF (congestive heart failure) (HCC)   CKD (chronic kidney disease) stage 4, GFR 15-29 ml/min (HCC)    1. Hypoglycemia due to insulin - 1. Q1H CBG checks and PRN 2. not ordering D10 gtt at this point in an effort to minimize volume in this patient with diastolic CHF, pulm edema on CXR, and CKD stage 4-5. 3. Try to give PO glucose PRN first 4. Otherwise RN to give D50 PRN 5. RN may also use the "standing orders for adults with hypoglycemia" order set as needed. 6. Repeat BMP at noon to keep eye on potassium 2. HTN - 1. Continue home amlodipine, coreg, and hydralazine 3. DM2 - 1. Holding lantus for the moment 4. CKD stage 4-5 - 1. Patient with outpt appointment for vein mapping  DVT prophylaxis: Heparin [place] Code Status: Full Family Communication: No family in room Disposition Plan: Home after admit Consults called: none Admission status: Place in 33    Darling Cieslewicz, [place] [facility]  How to contact the [facility] Attending or Consulting provider [place] or covering provider during after hours [place], for this patient?  1. Check the care team in [facility] and look for a) attending/consulting [facility] provider listed and b) the [facility] team listed 2. Log into [email or web address]  Amion Physician Scheduling and messaging for groups and whole hospitals  On call and physician scheduling software for group practices, residents, hospitalists and other medical providers for call, clinic, rotation and shift schedules. OnCall [employer] is a hospital-wide system for scheduling doctors and paging doctors on call. EasyPlot is for scientific plotting and data analysis.   [email or web address]  and use [place]'s universal password to access. If you do not have the password, please contact the hospital operator.  3. Locate the [facility] provider you are looking for under [facility] and page to a number that you can be directly  reached. 4. If you still have difficulty reaching the provider, please page the [facility] (Director on Call) for the Hospitalists listed on amion for assistance.  08/13/2019, 3:48 AM

## 2019-08-13 NOTE — ED Notes (Signed)
Crystal, pt's daughter, made aware of pending admission.

## 2019-08-13 NOTE — ED Notes (Signed)
Checked pt's CBG, now 83. Administered half amp of D50%. Will recheck and continue to monitor

## 2019-08-13 NOTE — ED Notes (Signed)
Pt provided apple juice to drink. Pt drinking very small sips at a time. Continued to encourage pt to drink

## 2019-08-13 NOTE — ED Provider Notes (Signed)
TIME SEEN: 1:28 AM  CHIEF COMPLAINT: hypoglycemia  HPI: Patient is a 68 y.o. F with HTN, DM, HLD who presents to the emergency department hypoglycemia.  Patient reportedly lethargic today.  Found to have glucose of 51 at home.  Given 15 g of D10 by EMS.  Blood glucose with EMS was subsequently 112.  Patient denies fevers, cough, chest pain, shortness of breath, abdominal pain, diarrhea, dysuria.  States she does feel cold.  She has not been outside recently.  Lives with daughter.  Levemir 36 units in AM and 32 units in PM.  Last dose around 9pm.  She states that she administers insulin herself.  Eating well.  Has had nausea and vomiting today - 4 times total today.    Patient admitted to the hospital on 08/05/2019 for hypertensive urgency, CHF exacerbation, acute renal failure, hyperkalemia.  Was discharged on 08/11/19.  Was changed to 18 units Levemir once daily due to her renal function.  Patient states she was not aware of this and continue to take her previous insulin regimen.  ROS: See HPI Constitutional: no fever  Eyes: no drainage  ENT: no runny nose   Cardiovascular:  no chest pain  Resp: no SOB  GI:  vomiting GU: no dysuria Integumentary: no rash  Allergy: no hives  Musculoskeletal: no leg swelling  Neurological: no slurred speech ROS otherwise negative  PAST MEDICAL HISTORY/PAST SURGICAL HISTORY:  Past Medical History:  Diagnosis Date  . Complication of anesthesia   . Constipation   . Diabetes mellitus    Type II  . Dyspnea   . Hyperlipemia   . Hypertension   . PONV (postoperative nausea and vomiting)     MEDICATIONS:  Prior to Admission medications   Medication Sig Start Date End Date Taking? Authorizing Provider  amLODipine (NORVASC) 10 MG tablet Take 1 tablet (10 mg total) by mouth daily. 08/11/19 09/10/19  Terrilee Croak, MD  aspirin EC 81 MG tablet Take 1 tablet (81 mg total) by mouth daily. 09/08/16   Orson Eva, MD  carvedilol (COREG) 12.5 MG tablet Take  0.5 tablets (6.25 mg total) by mouth 2 (two) times daily with a meal. 08/11/19 09/10/19  Dahal, Marlowe Aschoff, MD  hydrALAZINE (APRESOLINE) 25 MG tablet Take 1 tablet (25 mg total) by mouth 3 (three) times daily. 08/11/19 09/10/19  Terrilee Croak, MD  HYDROcodone-acetaminophen (NORCO/VICODIN) 5-325 MG tablet Take 1-2 tablets by mouth every 4 (four) hours as needed for moderate pain (pain score 4-6). 05/14/18   Danford, Suann Larry, MD  Insulin Detemir (LEVEMIR) 100 UNIT/ML Pen Inject 18 Units into the skin daily with breakfast. 08/11/19   Dahal, Marlowe Aschoff, MD  Insulin Pen Needle 32G X 4 MM MISC Use with insulin pen to dispense insulin as directed 09/08/16   Tat, Shanon Brow, MD  pentoxifylline (TRENTAL) 400 MG CR tablet Take 1 tablet (400 mg total) by mouth 3 (three) times daily with meals. 06/10/18   Newt Minion, MD  rosuvastatin (CRESTOR) 20 MG tablet Take 1 tablet (20 mg total) by mouth daily. 08/11/19 09/10/19  Terrilee Croak, MD    ALLERGIES:  No Known Allergies  SOCIAL HISTORY:  Social History   Tobacco Use  . Smoking status: Former Smoker    Years: 4.00    Types: Cigars    Quit date: 01/19/1984    Years since quitting: 35.5  . Smokeless tobacco: Never Used  Substance Use Topics  . Alcohol use: No    FAMILY HISTORY: Family History  Problem Relation Age  of Onset  . Hypertension Mother   . Diabetes Mother   . Hypertension Sister     EXAM: BP (!) 175/71 (BP Location: Right Arm)   Pulse 62   Resp 18   Ht 5\' 1"  (1.549 m)   Wt 73.9 kg   SpO2 100%   BMI 30.80 kg/m  CONSTITUTIONAL: Alert and oriented and responds appropriately to questions.  Elderly, in no distress HEAD: Normocephalic, atraumatic EYES: Conjunctivae clear, pupils appear equal, EOMI ENT: normal nose; moist mucous membranes NECK: Supple, no meningismus, no nuchal rigidity, no LAD  CARD: RRR; S1 and S2 appreciated; no murmurs, no clicks, no rubs, no gallops RESP: Normal chest excursion without splinting or tachypnea;  breath sounds clear and equal bilaterally; no wheezes, no rhonchi, no rales, no hypoxia or respiratory distress, speaking full sentences ABD/GI: Normal bowel sounds; non-distended; soft, non-tender, no rebound, no guarding, no peritoneal signs, no hepatosplenomegaly BACK:  The back appears normal and is non-tender to palpation, there is no CVA tenderness EXT: Normal ROM in all joints; non-tender to palpation; no edema; normal capillary refill; no cyanosis, no calf tenderness or swelling    SKIN: Normal color for age and race; skin feels cool to touch; no rash NEURO: Moves all extremities equally, normal speech, no facial asymmetry PSYCH: The patient's mood and manner are appropriate. Grooming and personal hygiene are appropriate.  MEDICAL DECISION MAKING: Patient here with hypoglycemia.  Skin feels cool to touch and unable to obtain oral temperature.  Rectal temperature is 93.7.  Suspect this is secondary to her hypoglycemia will obtain cultures, urine, chest x-ray in case this is sepsis.  She has no other infectious symptoms.  Recently COVID negative.  No signs of volume overload at this time.  She denies chest pain or shortness of breath.  Suspect her hypoglycemia is secondary to not decreasing her insulin in the setting of acute renal failure as well as having nausea and vomiting over the past 24 hours.  Abdominal exam today is benign.  Will encourage oral intake.  Will monitor blood sugar every hour.  Patient will need admission.  Placed under Bair hugger at this time.  ED PROGRESS: Patient's labs are reassuring.  No leukocytosis or leukopenia.  Normal lactate.  Chest x-ray does show increased interstitial markings which could be atypical infection versus pulmonary edema.  She does not appear volume overloaded at this time and is not having any infectious symptoms, chest pain, shortness of breath or cough.  We will monitor this.  I do not feel she needs antibiotics or diuresis currently.  She is not  hypoxic or having any increased work of breathing.  Again I suspect that her hypothermia is related to her hypoglycemia rather than sepsis.  Cultures are pending.  COVID swab is also pending given concerns for possible viral pneumonia on x-ray.  Her blood glucose remained stable but this is with [place] encouraging her to eat.  We will continue to monitor this closely and she may need a D10 infusion started but does not need it currently.  3:19 AM Discussed patient's case with hospitalist, Dr. Alcario Drought.  I have recommended admission and patient (and family if present) agree with this plan. Admitting physician will place admission orders.   I reviewed all nursing notes, vitals, pertinent previous records, EKGs, lab and urine results, imaging (as available).     EKG Interpretation  Date/Time:  Saturday August 13 2019 01:14:33 EDT Ventricular Rate:  62 PR Interval:    QRS Duration: 90  QT Interval:  420 QTC Calculation: 427 R Axis:   27 Text Interpretation:  Normal sinus rhythm Abnormal R-wave progression, early transition Nonspecific T abnormalities, lateral leads No significant change since last tracing Confirmed by Pryor Curia [phone]) on 08/13/2019 2:12:19 AM       EKG Interpretation  Date/Time:  Saturday August 13 2019 02:08:45 EDT Ventricular Rate:  59 PR Interval:    QRS Duration: 121 QT Interval:  426 QTC Calculation: 422 R Axis:   21 Text Interpretation:  Sinus bradycardia Probable left ventricular hypertrophy Nonspecific T abnormalities, lateral leads No significant change since last tracing Confirmed by Pryor Curia [phone]) on 08/13/2019 2:13:15 AM        CRITICAL CARE Performed by: Cyril Mourning Amaira Safley   Total critical care time: 65 minutes  Critical care time was exclusive of separately billable procedures and treating other patients.  Critical care was necessary to treat or prevent imminent or life-threatening deterioration.  Critical care was time spent personally by  me on the following activities: development of treatment plan with patient and/or surrogate as well as nursing, discussions with consultants, evaluation of patient's response to treatment, examination of patient, obtaining history from patient or surrogate, ordering and performing treatments and interventions, ordering and review of laboratory studies, ordering and review of radiographic studies, pulse oximetry and re-evaluation of patient's condition.  Jourden Hopkins Wenner was evaluated in Emergency Department on 08/13/2019 for the symptoms described in the history of present illness. She was evaluated in the context of the global COVID-19 pandemic, which necessitated consideration that the patient might be at risk for infection with the SARS-CoV-2 virus that causes COVID-19. Institutional protocols and algorithms that pertain to the evaluation of patients at risk for COVID-19 are in a state of rapid change based on information released by regulatory bodies including the CDC and federal and state organizations. These policies and algorithms were followed during the patient's care in the ED.    Farrell Broerman, Delice Bison, DO 08/13/19 [phone]

## 2019-08-13 NOTE — ED Notes (Signed)
Pt placed on bed pan per request; informed of need of urine and in and out cath if unable to urinate

## 2019-08-13 NOTE — ED Notes (Signed)
Report given to 2C RN. All questions answered. 

## 2019-08-13 NOTE — ED Triage Notes (Signed)
Pt. Arrived via EMS from home. Chief complaint is low blood sugar And lethargy. Pt. denies pain. Pt.  stated that she was in the hospital for 7 days for same and was discharged Friday October 9. EMS stated CBG was 112 enroute and gave her 15 grams of D-10. Pt is oriented x 4  and alert

## 2019-08-13 NOTE — ED Notes (Signed)
SDU ordered bfast 

## 2019-08-13 NOTE — Discharge Summary (Signed)
Physician Discharge Summary  Patient ID: Melissa Howe MRN: [id number] DOB/AGE: 69-Oct-1952 69 y.o.  Admit date: 08/13/2019 Discharge date: 08/13/2019  Admission Diagnoses:  Discharge Diagnoses:  Principal Problem:   Hypoglycemia due to insulin Active Problems:   Essential hypertension   Uncontrolled type 2 diabetes mellitus with hyperglycemia, with long-term current use of insulin (HCC)   Chronic diastolic CHF (congestive heart failure) (HCC)   CKD (chronic kidney disease) stage 4, GFR 15-29 ml/min (HCC)   Discharged Condition: stable  Hospital Course:  Patientis a 68 year old female with past medical history significant for hypertension, hyperlipidemia, diabetes mellitus and chronic kidney disease stage III/IV.  Patient was recently admitted to the hospital with worsening renal function, hypertensive urgency, hyperkalemia and pulmonary edema.  Patient was discharged back home a day prior to the presentation to the hospital.  Apparently, on discharge, patient did not follow discharge instruction on insulin regimen.  Patient was using excessive amount of insulin on discharge despite worsening renal function.  Patient was at admitted with hypoglycemia.  Patient was managed supportively.  Patient's medication and insulin regimen were reviewed with the patient and patient's daughter during the hospital stay and they both voiced understanding.  Blood sugar has been optimized.  Patient is eager to be discharged back on.  Patient is back to her normal self.  Prior to discharge, patient reported blurry vision involving one of the eyes.  Ophthalmology team on consult was consulted, and the ophthalmologist discussed with the patient over the phone.  The ophthalmologist has asked the patient to follow-up with him within 1 week of discharge for further assessment and management.  Patient will be discharged back home to the care of the primary care provider.  Consults: nephrology  Significant  Diagnostic Studies:  Labs:   Discharge Exam: Blood pressure (!) 141/71, pulse 70, temperature 98.3 F (36.8 C), temperature source Oral, resp. rate 15, height 5\' 1"  (1.549 m), weight 74 kg, SpO2 100 %.  Disposition: Discharge disposition: 01-Home or Self Care  Discharge Instructions    Diet - low sodium heart healthy   Complete by: As directed    Increase activity slowly   Complete by: As directed      Allergies as of 08/13/2019   No Known Allergies     Medication List    TAKE these medications   amLODipine 10 MG tablet Commonly known as: NORVASC Take 1 tablet (10 mg total) by mouth daily.   aspirin EC 81 MG tablet Take 1 tablet (81 mg total) by mouth daily.   carvedilol 12.5 MG tablet Commonly known as: COREG Take 0.5 tablets (6.25 mg total) by mouth 2 (two) times daily with a meal.   hydrALAZINE 25 MG tablet Commonly known as: APRESOLINE Take 1 tablet (25 mg total) by mouth 3 (three) times daily.   HYDROcodone-acetaminophen 5-325 MG tablet Commonly known as: NORCO/VICODIN Take 1-2 tablets by mouth every 4 (four) hours as needed for moderate pain (pain score 4-6).   Insulin Detemir 100 UNIT/ML Pen Commonly known as: LEVEMIR Inject 18 Units into the skin daily with breakfast.   Insulin Pen Needle 32G X 4 MM Misc Use with insulin pen to dispense insulin as directed   pentoxifylline 400 MG CR tablet Commonly known as: TRENTAL Take 1 tablet (400 mg total) by mouth 3 (three) times daily with meals.   rosuvastatin 20 MG tablet Commonly known as: CRESTOR Take 1 tablet (20 mg total) by mouth daily.        Signed: Yehuda Savannah  I Admir Candelas 08/13/2019, 1:36 PM

## 2019-08-15 LAB — URINE CULTURE: Culture: >=100000 — AB

## 2019-08-18 LAB — CULTURE, BLOOD (ROUTINE X 2)
Culture: NO GROWTH
Culture: NO GROWTH
Special Requests: ADEQUATE
Special Requests: ADEQUATE

## 2019-09-06 ENCOUNTER — Other Ambulatory Visit: Payer: Self-pay | Admitting: Family

## 2019-09-06 DIAGNOSIS — Z1231 Encounter for screening mammogram for malignant neoplasm of breast: Secondary | ICD-10-CM

## 2019-09-08 ENCOUNTER — Ambulatory Visit (INDEPENDENT_AMBULATORY_CARE_PROVIDER_SITE_OTHER): Payer: Medicare Other | Admitting: Podiatry

## 2019-09-08 ENCOUNTER — Encounter: Payer: Self-pay | Admitting: Podiatry

## 2019-09-08 ENCOUNTER — Other Ambulatory Visit: Payer: Self-pay

## 2019-09-08 VITALS — BP 163/71 | HR 76 | Resp 16

## 2019-09-08 DIAGNOSIS — E1140 Type 2 diabetes mellitus with diabetic neuropathy, unspecified: Secondary | ICD-10-CM | POA: Diagnosis not present

## 2019-09-08 DIAGNOSIS — B351 Tinea unguium: Secondary | ICD-10-CM

## 2019-09-08 DIAGNOSIS — M79675 Pain in left toe(s): Secondary | ICD-10-CM | POA: Diagnosis not present

## 2019-09-08 DIAGNOSIS — M79674 Pain in right toe(s): Secondary | ICD-10-CM | POA: Diagnosis not present

## 2019-09-08 DIAGNOSIS — E1149 Type 2 diabetes mellitus with other diabetic neurological complication: Secondary | ICD-10-CM | POA: Diagnosis not present

## 2019-09-13 NOTE — Progress Notes (Signed)
Subjective:   Patient ID: Melissa Howe, female   DOB: 68 y.o.   MRN: [id number]   HPI Patient presents with severely thickened dystrophic nailbeds that she cannot take care of with long-term diabetes under fair control and states that she needs her nails taken care of.  Patient does not smoke likes to be active   Review of Systems  All other systems reviewed and are negative.       Objective:  Physical Exam Vitals signs and nursing note reviewed.  Constitutional:      Appearance: She is well-developed.  Pulmonary:     Effort: Pulmonary effort is normal.  Musculoskeletal: Normal range of motion.  Skin:    General: Skin is warm.  Neurological:     Mental Status: She is alert.     Neurovascular status found to be mildly diminished with diminished sharp dull vibratory bilateral with thick yellow brittle nailbeds 1-5 both feet that she cannot cut with long-term diabetes     Assessment:  At risk diabetic with mycotic nail infection 1-5 both feet with elevated sugar levels     Plan:  H&P diabetic education rendered and debrided nailbeds 1-5 both feet with no iatrogenic bleeding and advised on routine care every 3 months and will be seen earlier if any other issues were to occur

## 2019-09-13 NOTE — H&P (View-Only) (Signed)
Subjective:   Patient ID: Melissa Howe, female   DOB: 68 y.o.   MRN: [id number]   HPI Patient presents with severely thickened dystrophic nailbeds that she cannot take care of with long-term diabetes under fair control and states that she needs her nails taken care of.  Patient does not smoke likes to be active   Review of Systems  All other systems reviewed and are negative.       Objective:  Physical Exam Vitals signs and nursing note reviewed.  Constitutional:      Appearance: She is well-developed.  Pulmonary:     Effort: Pulmonary effort is normal.  Musculoskeletal: Normal range of motion.  Skin:    General: Skin is warm.  Neurological:     Mental Status: She is alert.     Neurovascular status found to be mildly diminished with diminished sharp dull vibratory bilateral with thick yellow brittle nailbeds 1-5 both feet that she cannot cut with long-term diabetes     Assessment:  At risk diabetic with mycotic nail infection 1-5 both feet with elevated sugar levels     Plan:  H&P diabetic education rendered and debrided nailbeds 1-5 both feet with no iatrogenic bleeding and advised on routine care every 3 months and will be seen earlier if any other issues were to occur

## 2019-09-14 ENCOUNTER — Other Ambulatory Visit (HOSPITAL_COMMUNITY): Payer: Self-pay | Admitting: *Deleted

## 2019-09-15 ENCOUNTER — Other Ambulatory Visit: Payer: Self-pay

## 2019-09-15 ENCOUNTER — Ambulatory Visit (HOSPITAL_COMMUNITY)
Admission: RE | Admit: 2019-09-15 | Discharge: 2019-09-15 | Disposition: A | Discharge status: Home / Self Care | Payer: Medicare Other | Source: Ambulatory Visit | Attending: Internal Medicine | Admitting: Internal Medicine

## 2019-09-15 DIAGNOSIS — D631 Anemia in chronic kidney disease: Secondary | ICD-10-CM | POA: Diagnosis not present

## 2019-09-15 DIAGNOSIS — N189 Chronic kidney disease, unspecified: Secondary | ICD-10-CM | POA: Diagnosis not present

## 2019-09-15 MED ORDER — SODIUM CHLORIDE 0.9 % IV SOLN
510.0000 mg | INTRAVENOUS | Status: DC
  Administered 2019-09-15: 510 mg via INTRAVENOUS
  Filled 2019-09-15: qty 510

## 2019-09-15 NOTE — Discharge Instructions (Signed)

## 2019-09-23 ENCOUNTER — Other Ambulatory Visit: Payer: Self-pay

## 2019-09-23 ENCOUNTER — Encounter (HOSPITAL_COMMUNITY)
Admission: RE | Admit: 2019-09-23 | Discharge: 2019-09-23 | Disposition: A | Discharge status: Home / Self Care | Payer: Medicare Other | Source: Ambulatory Visit | Attending: Internal Medicine | Admitting: Internal Medicine

## 2019-09-23 DIAGNOSIS — D631 Anemia in chronic kidney disease: Secondary | ICD-10-CM | POA: Diagnosis not present

## 2019-09-23 DIAGNOSIS — N189 Chronic kidney disease, unspecified: Secondary | ICD-10-CM | POA: Diagnosis present

## 2019-09-23 MED ORDER — SODIUM CHLORIDE 0.9 % IV SOLN
510.0000 mg | INTRAVENOUS | Status: DC
  Administered 2019-09-23: 510 mg via INTRAVENOUS
  Filled 2019-09-23: qty 17

## 2019-09-26 ENCOUNTER — Telehealth (HOSPITAL_COMMUNITY): Payer: Self-pay | Admitting: *Deleted

## 2019-09-26 ENCOUNTER — Other Ambulatory Visit: Payer: Self-pay

## 2019-09-26 DIAGNOSIS — N184 Chronic kidney disease, stage 4 (severe): Secondary | ICD-10-CM

## 2019-09-26 NOTE — Telephone Encounter (Signed)

## 2019-09-27 ENCOUNTER — Ambulatory Visit (HOSPITAL_COMMUNITY)
Admission: RE | Admit: 2019-09-27 | Discharge: 2019-09-27 | Disposition: A | Discharge status: Home / Self Care | Payer: Medicare Other | Source: Ambulatory Visit | Attending: Family | Admitting: Family

## 2019-09-27 ENCOUNTER — Ambulatory Visit (INDEPENDENT_AMBULATORY_CARE_PROVIDER_SITE_OTHER)
Admission: RE | Admit: 2019-09-27 | Discharge: 2019-09-27 | Disposition: A | Discharge status: Home / Self Care | Payer: Medicare Other | Source: Ambulatory Visit | Attending: Family | Admitting: Family

## 2019-09-27 ENCOUNTER — Other Ambulatory Visit: Payer: Self-pay

## 2019-09-27 ENCOUNTER — Ambulatory Visit (INDEPENDENT_AMBULATORY_CARE_PROVIDER_SITE_OTHER): Payer: Medicare Other | Admitting: Vascular Surgery

## 2019-09-27 ENCOUNTER — Encounter: Payer: Self-pay | Admitting: *Deleted

## 2019-09-27 ENCOUNTER — Encounter: Payer: Self-pay | Admitting: Vascular Surgery

## 2019-09-27 VITALS — BP 172/81 | HR 76 | Temp 97.9°F | Resp 18 | Ht 64.0 in | Wt 169.0 lb

## 2019-09-27 DIAGNOSIS — N184 Chronic kidney disease, stage 4 (severe): Secondary | ICD-10-CM

## 2019-09-27 NOTE — Progress Notes (Signed)
Message left on voice mail for Shaquina at [facility] to clarify how soon patient needs to have surgery for HD access. This office will schedule as they determine need.

## 2019-09-27 NOTE — Progress Notes (Signed)
[facility] [place]  Patient name: Melissa Howe MRN: [id number] DOB: [birth year] Sex: female  REASON FOR CONSULT: Discuss access for hemodialysis  HPI: Melissa Howe is a 68 y.o. female, who is here today for discussion of hemodialysis access.  She is not currently on dialysis.  She has had progression of renal insufficiency and is approaching need.  She is right-handed.  She has no history of pacemaker or other central venous catheters.  She has poorly controlled hypertension and diabetes as the probable cause of her renal failure.  Past Medical History:  Diagnosis Date  . Chronic kidney disease   . Complication of anesthesia   . Constipation   . Diabetes mellitus    Type II  . Dyspnea   . Hyperlipemia   . Hypertension   . PONV (postoperative nausea and vomiting)     Family History  Problem Relation Age of Onset  . Hypertension Mother   . Diabetes Mother   . Hypertension Sister     SOCIAL HISTORY: Social History   Socioeconomic History  . Marital status: Divorced    Spouse name: Not on file  . Number of children: Not on file  . Years of education: Not on file  . Highest education level: Not on file  Occupational History  . Occupation: Retired  Scientific laboratory technician  . Financial resource strain: Not on file  . Food insecurity    Worry: Patient refused    Inability: Patient refused  . Transportation needs    Medical: Patient refused    Non-medical: Patient refused  Tobacco Use  . Smoking status: Former Smoker    Years: 4.00    Types: Cigars    Quit date: 01/19/1984    Years since quitting: 35.7  . Smokeless tobacco: Never Used  Substance and Sexual Activity  . Alcohol use: No  . Drug use: No  . Sexual activity: Not on file  Lifestyle  . Physical activity    Days per week: Patient refused    Minutes per session: Patient refused  . Stress: Only a little  Relationships  . Social Herbalist  on phone: Patient refused    Gets together: Patient refused    Attends religious service: Patient refused    Active member of club or organization: Patient refused    Attends meetings of clubs or organizations: Patient refused    Relationship status: Patient refused  . Intimate partner violence    Fear of current or ex partner: Patient refused    Emotionally abused: Patient refused    Physically abused: Patient refused    Forced sexual activity: Patient refused  Other Topics Concern  . Not on file  Social History Narrative   Lives in [place] with her dtr.  Previously worked in a [employer] but has been out on disability since 2009.  Recently moved from disability to "retired" when she turned 41.  Sedentary.  Knits frequently for fun.    No Known Allergies  Current Outpatient Medications  Medication Sig Dispense Refill  . aspirin EC 81 MG tablet Take 1 tablet (81 mg total) by mouth daily. 30 tablet 0  . DUREZOL 0.05 % EMUL     . hydrALAZINE (APRESOLINE) 50 MG tablet Take 50 mg by mouth 3 (three) times daily.    Marland Kitchen HYDROcodone-acetaminophen (NORCO/VICODIN) 5-325 MG tablet Take 1-2 tablets by mouth every 4 (four) hours as needed for moderate pain (pain score 4-6). 12 tablet 0  .  Insulin Detemir (LEVEMIR) 100 UNIT/ML Pen Inject 18 Units into the skin daily with breakfast. 15 mL 1  . Insulin Pen Needle 32G X 4 MM MISC Use with insulin pen to dispense insulin as directed 100 each 1  . pentoxifylline (TRENTAL) 400 MG CR tablet Take 1 tablet (400 mg total) by mouth 3 (three) times daily with meals. 90 tablet 3  . VENTOLIN HFA 108 (90 Base) MCG/ACT inhaler SMARTSIG:1-2 Puff(s) By Mouth Every 6 Hours PRN    . amLODipine (NORVASC) 10 MG tablet Take 1 tablet (10 mg total) by mouth daily. 30 tablet 0  . carvedilol (COREG) 12.5 MG tablet Take 0.5 tablets (6.25 mg total) by mouth 2 (two) times daily with a meal. 30 tablet 0  . rosuvastatin (CRESTOR) 20 MG tablet Take 1 tablet (20 mg total) by mouth daily. 30  tablet 0   No current facility-administered medications for this visit.     REVIEW OF SYSTEMS:  [X]  denotes positive finding, [ ]  denotes negative finding Cardiac  Comments:  Chest pain or chest pressure:    Shortness of breath upon exertion: x   Short of breath when lying flat: x   Irregular heart rhythm:        Vascular    Pain in calf, thigh, or hip brought on by ambulation:    Pain in feet at night that wakes you up from your sleep:     Blood clot in your veins:    Leg swelling:         Pulmonary    Oxygen at home:    Productive cough:     Wheezing:  x       Neurologic    Sudden weakness in arms or legs:     Sudden numbness in arms or legs:     Sudden onset of difficulty speaking or slurred speech:    Temporary loss of vision in one eye:  x   Problems with dizziness:         Gastrointestinal    Blood in stool:     Vomited blood:         Genitourinary    Burning when urinating:     Blood in urine:        Psychiatric    Major depression:         Hematologic    Bleeding problems:    Problems with blood clotting too easily:        Skin    Rashes or ulcers:        Constitutional    Fever or chills:      PHYSICAL EXAM: Vitals:   09/27/19 1108  BP: (!) 172/81  Pulse: 76  Resp: 18  Temp: 97.9 F (36.6 C)  SpO2: 98%  Weight: 169 lb (76.7 kg)  Height: 5\' 4"  (1.626 m)    GENERAL: The patient is a well-nourished female, in no acute distress. The vital signs are documented above. CARDIOVASCULAR: 2+ radial pulses bilaterally.  Extremely small surface veins by physical exam bilaterally PULMONARY: There is good air exchange  ABDOMEN: Soft and non-tender  MUSCULOSKELETAL: There are no major deformities or cyanosis. NEUROLOGIC: No focal weakness or paresthesias are detected. SKIN: There are no ulcers or rashes noted. PSYCHIATRIC: The patient has a normal affect.  DATA:  Upper extremity arterial and venous studies were reviewed.  This reveals normal  triphasic radial and ulnar flow with normal diameter in the arteries.  The cephalic and basilic veins are extremely  small bilaterally  MEDICAL ISSUES: I had a long discussion with the patient regarding options for hemodialysis to include tunneled catheter, AV fistula and AV graft.  She is not a candidate for fistula due to her small vein size.  I would recommend left arm AV graft as her first access.  The referral from [facility] instructed to wait on AV graft at this time.  They also however made this an urgent visit.  We will communicate with [facility] directly to confirm the timing of her access.  She understands this would be done as an outpatient at [facility]   [name], MD [facility] Tel [phone] Pager [phone]

## 2019-09-28 ENCOUNTER — Other Ambulatory Visit: Payer: Self-pay | Admitting: *Deleted

## 2019-09-28 NOTE — Progress Notes (Signed)
Spoke with patient and daughter to give pre-op instructions. Nasal swab at [facility] 10/01/2019 @ 9:40 am Pre-op testing lane. To be at [facility] admitting at 9 am or as directed by the hospital on 10/05/2019 for surgery with Dr. Donnetta Hutching. NPO past MN night prior and must have a driver and caregiver for discharge. Expect a call and follow the detailed instructions for insulin adjustment and surgery received from the hospital preadmission department. Verbalized understanding.

## 2019-10-01 ENCOUNTER — Other Ambulatory Visit (HOSPITAL_COMMUNITY)
Admission: RE | Admit: 2019-10-01 | Discharge: 2019-10-01 | Disposition: A | Discharge status: Home / Self Care | Payer: Medicare Other | Source: Ambulatory Visit | Attending: Vascular Surgery | Admitting: Vascular Surgery

## 2019-10-01 DIAGNOSIS — Z01812 Encounter for preprocedural laboratory examination: Secondary | ICD-10-CM | POA: Insufficient documentation

## 2019-10-01 DIAGNOSIS — Z20828 Contact with and (suspected) exposure to other viral communicable diseases: Secondary | ICD-10-CM | POA: Diagnosis not present

## 2019-10-03 ENCOUNTER — Encounter (HOSPITAL_COMMUNITY): Payer: Self-pay | Admitting: *Deleted

## 2019-10-03 ENCOUNTER — Other Ambulatory Visit: Payer: Self-pay

## 2019-10-03 DIAGNOSIS — N184 Chronic kidney disease, stage 4 (severe): Secondary | ICD-10-CM | POA: Diagnosis not present

## 2019-10-03 DIAGNOSIS — Z794 Long term (current) use of insulin: Secondary | ICD-10-CM | POA: Diagnosis not present

## 2019-10-03 DIAGNOSIS — E11311 Type 2 diabetes mellitus with unspecified diabetic retinopathy with macular edema: Secondary | ICD-10-CM | POA: Diagnosis not present

## 2019-10-03 DIAGNOSIS — I5032 Chronic diastolic (congestive) heart failure: Secondary | ICD-10-CM | POA: Diagnosis not present

## 2019-10-03 DIAGNOSIS — Z87891 Personal history of nicotine dependence: Secondary | ICD-10-CM | POA: Diagnosis not present

## 2019-10-03 DIAGNOSIS — Z79899 Other long term (current) drug therapy: Secondary | ICD-10-CM | POA: Diagnosis not present

## 2019-10-03 DIAGNOSIS — I130 Hypertensive heart and chronic kidney disease with heart failure and stage 1 through stage 4 chronic kidney disease, or unspecified chronic kidney disease: Secondary | ICD-10-CM | POA: Diagnosis not present

## 2019-10-03 DIAGNOSIS — E1140 Type 2 diabetes mellitus with diabetic neuropathy, unspecified: Secondary | ICD-10-CM | POA: Diagnosis not present

## 2019-10-03 DIAGNOSIS — E11319 Type 2 diabetes mellitus with unspecified diabetic retinopathy without macular edema: Secondary | ICD-10-CM | POA: Diagnosis not present

## 2019-10-03 DIAGNOSIS — N189 Chronic kidney disease, unspecified: Secondary | ICD-10-CM | POA: Diagnosis present

## 2019-10-03 DIAGNOSIS — Z7982 Long term (current) use of aspirin: Secondary | ICD-10-CM | POA: Diagnosis not present

## 2019-10-03 DIAGNOSIS — E1122 Type 2 diabetes mellitus with diabetic chronic kidney disease: Secondary | ICD-10-CM | POA: Diagnosis not present

## 2019-10-03 LAB — NOVEL CORONAVIRUS, NAA (HOSP ORDER, SEND-OUT TO REF LAB; TAT 18-24 HRS): SARS-CoV-2, NAA: NOT DETECTED

## 2019-10-03 NOTE — Progress Notes (Signed)
Melissa Howe denies chest pain or shortness of breath. Patient has been in quarantine since covid test 12/5. Melissa Howe has type II diabetes. I instructed patient ot take 1/2 dose of Levemir (9 units) on Wednesday if CBG > 70. I instructed patient to check CBG after awaking and every 2 hours until arrival  to the hospital.  I Instructed patient if CBG is less than 70 to take 4 Glucose Tablets or 1 tube of Glucose Gel or 1/2 cup of a clear juice. Recheck CBG in 15 minutes then call pre- op desk at [phone] for further instructions. If scheduled to receive Insulin, do not take Insulin

## 2019-10-05 ENCOUNTER — Encounter (HOSPITAL_COMMUNITY): Payer: Self-pay

## 2019-10-05 ENCOUNTER — Ambulatory Visit (HOSPITAL_COMMUNITY): Payer: Medicare Other | Admitting: Certified Registered Nurse Anesthetist

## 2019-10-05 ENCOUNTER — Ambulatory Visit (HOSPITAL_COMMUNITY)
Admission: RE | Admit: 2019-10-05 | Discharge: 2019-10-05 | Disposition: A | Discharge status: Home / Self Care | Payer: Medicare Other | Attending: Vascular Surgery | Admitting: Vascular Surgery

## 2019-10-05 ENCOUNTER — Encounter (HOSPITAL_COMMUNITY)
Admission: RE | Disposition: A | Discharge status: Home / Self Care | Payer: Self-pay | Source: Home / Self Care | Attending: Vascular Surgery

## 2019-10-05 ENCOUNTER — Other Ambulatory Visit: Payer: Self-pay

## 2019-10-05 DIAGNOSIS — I130 Hypertensive heart and chronic kidney disease with heart failure and stage 1 through stage 4 chronic kidney disease, or unspecified chronic kidney disease: Secondary | ICD-10-CM | POA: Diagnosis not present

## 2019-10-05 DIAGNOSIS — E11311 Type 2 diabetes mellitus with unspecified diabetic retinopathy with macular edema: Secondary | ICD-10-CM | POA: Insufficient documentation

## 2019-10-05 DIAGNOSIS — N184 Chronic kidney disease, stage 4 (severe): Secondary | ICD-10-CM

## 2019-10-05 DIAGNOSIS — I5032 Chronic diastolic (congestive) heart failure: Secondary | ICD-10-CM | POA: Diagnosis not present

## 2019-10-05 DIAGNOSIS — E11319 Type 2 diabetes mellitus with unspecified diabetic retinopathy without macular edema: Secondary | ICD-10-CM | POA: Insufficient documentation

## 2019-10-05 DIAGNOSIS — Z87891 Personal history of nicotine dependence: Secondary | ICD-10-CM | POA: Insufficient documentation

## 2019-10-05 DIAGNOSIS — Z7982 Long term (current) use of aspirin: Secondary | ICD-10-CM | POA: Insufficient documentation

## 2019-10-05 DIAGNOSIS — E1122 Type 2 diabetes mellitus with diabetic chronic kidney disease: Secondary | ICD-10-CM | POA: Insufficient documentation

## 2019-10-05 DIAGNOSIS — N185 Chronic kidney disease, stage 5: Secondary | ICD-10-CM | POA: Diagnosis not present

## 2019-10-05 DIAGNOSIS — Z79899 Other long term (current) drug therapy: Secondary | ICD-10-CM | POA: Insufficient documentation

## 2019-10-05 DIAGNOSIS — E1140 Type 2 diabetes mellitus with diabetic neuropathy, unspecified: Secondary | ICD-10-CM | POA: Insufficient documentation

## 2019-10-05 DIAGNOSIS — Z794 Long term (current) use of insulin: Secondary | ICD-10-CM | POA: Insufficient documentation

## 2019-10-05 HISTORY — PX: AV FISTULA PLACEMENT: SHX1204

## 2019-10-05 LAB — POCT I-STAT, CHEM 8
BUN: 57 mg/dL — ABNORMAL HIGH (ref 8–23)
Calcium, Ion: 1.14 mmol/L — ABNORMAL LOW (ref 1.15–1.40)
Chloride: 115 mmol/L — ABNORMAL HIGH (ref 98–111)
Creatinine, Ser: 4.1 mg/dL — ABNORMAL HIGH (ref 0.44–1.00)
Glucose, Bld: 90 mg/dL (ref 70–99)
HCT: 23 % — ABNORMAL LOW (ref 36.0–46.0)
Hemoglobin: 7.8 g/dL — ABNORMAL LOW (ref 12.0–15.0)
Potassium: 5.2 mmol/L — ABNORMAL HIGH (ref 3.5–5.1)
Sodium: 141 mmol/L (ref 135–145)
TCO2: 19 mmol/L — ABNORMAL LOW (ref 22–32)

## 2019-10-05 LAB — GLUCOSE, CAPILLARY
Glucose-Capillary: 91 mg/dL (ref 70–99)
Glucose-Capillary: 99 mg/dL (ref 70–99)

## 2019-10-05 SURGERY — INSERTION OF ARTERIOVENOUS (AV) GORE-TEX GRAFT ARM
Anesthesia: Monitor Anesthesia Care | Site: Arm Lower | Laterality: Left

## 2019-10-05 MED ORDER — SODIUM CHLORIDE 0.9 % IV SOLN: INTRAVENOUS | Status: DC

## 2019-10-05 MED ORDER — CHLORHEXIDINE GLUCONATE 4 % EX LIQD: 60.0000 mL | Freq: Once | CUTANEOUS | Status: DC

## 2019-10-05 MED ORDER — FENTANYL CITRATE (PF) 100 MCG/2ML IJ SOLN
INTRAMUSCULAR | Status: DC | PRN
  Administered 2019-10-05: 25 ug via INTRAVENOUS

## 2019-10-05 MED ORDER — MIDAZOLAM HCL 5 MG/5ML IJ SOLN
INTRAMUSCULAR | Status: DC | PRN
  Administered 2019-10-05 (×2): 1 mg via INTRAVENOUS

## 2019-10-05 MED ORDER — LIDOCAINE-EPINEPHRINE 0.5 %-1:200000 IJ SOLN
INTRAMUSCULAR | Status: AC
  Filled 2019-10-05: qty 1

## 2019-10-05 MED ORDER — LIDOCAINE-EPINEPHRINE 0.5 %-1:200000 IJ SOLN
INTRAMUSCULAR | Status: DC | PRN
  Administered 2019-10-05: 50 mL

## 2019-10-05 MED ORDER — SODIUM CHLORIDE 0.9 % IV SOLN
INTRAVENOUS | Status: DC | PRN
  Administered 2019-10-05: 500 mL

## 2019-10-05 MED ORDER — ONDANSETRON HCL 4 MG/2ML IJ SOLN
INTRAMUSCULAR | Status: DC | PRN
  Administered 2019-10-05: 4 mg via INTRAVENOUS

## 2019-10-05 MED ORDER — HYDROMORPHONE HCL 1 MG/ML IJ SOLN: 0.2500 mg | INTRAMUSCULAR | Status: DC | PRN

## 2019-10-05 MED ORDER — HYDROCODONE-ACETAMINOPHEN 5-325 MG PO TABS: 1.0000 | ORAL_TABLET | Freq: Four times a day (QID) | ORAL | 0 refills | Status: DC | PRN

## 2019-10-05 MED ORDER — MIDAZOLAM HCL 2 MG/2ML IJ SOLN
INTRAMUSCULAR | Status: AC
  Filled 2019-10-05: qty 2

## 2019-10-05 MED ORDER — PROPOFOL 500 MG/50ML IV EMUL
INTRAVENOUS | Status: DC | PRN
  Administered 2019-10-05: 75 ug/kg/min via INTRAVENOUS

## 2019-10-05 MED ORDER — MEPERIDINE HCL 25 MG/ML IJ SOLN: 6.2500 mg | INTRAMUSCULAR | Status: DC | PRN

## 2019-10-05 MED ORDER — SODIUM CHLORIDE 0.9 % IV SOLN
INTRAVENOUS | Status: DC | PRN
  Administered 2019-10-05 (×2): via INTRAVENOUS

## 2019-10-05 MED ORDER — 0.9 % SODIUM CHLORIDE (POUR BTL) OPTIME
TOPICAL | Status: DC | PRN
  Administered 2019-10-05: 1000 mL

## 2019-10-05 MED ORDER — ONDANSETRON HCL 4 MG/2ML IJ SOLN: 4.0000 mg | Freq: Once | INTRAMUSCULAR | Status: DC | PRN

## 2019-10-05 MED ORDER — CEFAZOLIN SODIUM-DEXTROSE 2-4 GM/100ML-% IV SOLN
2.0000 g | INTRAVENOUS | Status: AC
  Administered 2019-10-05: 11:00:00 2 g via INTRAVENOUS

## 2019-10-05 MED ORDER — SODIUM CHLORIDE 0.9 % IV SOLN
INTRAVENOUS | Status: AC
  Filled 2019-10-05: qty 1.2

## 2019-10-05 MED ORDER — FENTANYL CITRATE (PF) 250 MCG/5ML IJ SOLN
INTRAMUSCULAR | Status: AC
  Filled 2019-10-05: qty 5

## 2019-10-05 MED ORDER — LIDOCAINE HCL (CARDIAC) PF 100 MG/5ML IV SOSY
PREFILLED_SYRINGE | INTRAVENOUS | Status: DC | PRN
  Administered 2019-10-05: 60 mg via INTRATRACHEAL

## 2019-10-05 MED ORDER — PHENYLEPHRINE HCL-NACL 10-0.9 MG/250ML-% IV SOLN
INTRAVENOUS | Status: DC | PRN
  Administered 2019-10-05: 25 ug/min via INTRAVENOUS

## 2019-10-05 SURGICAL SUPPLY — 38 items
ARMBAND PINK RESTRICT EXTREMIT (MISCELLANEOUS) ×4 IMPLANT
CANISTER SUCT 3000ML PPV (MISCELLANEOUS) ×2 IMPLANT
CANNULA VESSEL 3MM 2 BLNT TIP (CANNULA) ×2 IMPLANT
CLIP LIGATING EXTRA MED SLVR (CLIP) ×2 IMPLANT
CLIP LIGATING EXTRA SM BLUE (MISCELLANEOUS) ×2 IMPLANT
COVER WAND RF STERILE (DRAPES) ×2 IMPLANT
DECANTER SPIKE VIAL GLASS SM (MISCELLANEOUS) ×2 IMPLANT
DERMABOND ADVANCED (GAUZE/BANDAGES/DRESSINGS) ×1
DERMABOND ADVANCED .7 DNX12 (GAUZE/BANDAGES/DRESSINGS) ×1 IMPLANT
ELECT REM PT RETURN 9FT ADLT (ELECTROSURGICAL) ×2
ELECTRODE REM PT RTRN 9FT ADLT (ELECTROSURGICAL) ×1 IMPLANT
GAUZE SPONGE 4X4 12PLY STRL (GAUZE/BANDAGES/DRESSINGS) ×2 IMPLANT
GLOVE BIO SURGEON STRL SZ 6.5 (GLOVE) ×4 IMPLANT
GLOVE BIO SURGEON STRL SZ7.5 (GLOVE) ×2 IMPLANT
GLOVE BIOGEL PI IND STRL 6.5 (GLOVE) ×2 IMPLANT
GLOVE BIOGEL PI IND STRL 7.5 (GLOVE) ×1 IMPLANT
GLOVE BIOGEL PI INDICATOR 6.5 (GLOVE) ×2
GLOVE BIOGEL PI INDICATOR 7.5 (GLOVE) ×1
GLOVE SS BIOGEL STRL SZ 7.5 (GLOVE) ×1 IMPLANT
GLOVE SUPERSENSE BIOGEL SZ 7.5 (GLOVE) ×1
GLOVE SURG SS PI 6.5 STRL IVOR (GLOVE) ×2 IMPLANT
GOWN STRL NON-REIN LRG LVL3 (GOWN DISPOSABLE) ×2 IMPLANT
GOWN STRL REUS W/ TWL LRG LVL3 (GOWN DISPOSABLE) ×3 IMPLANT
GOWN STRL REUS W/TWL LRG LVL3 (GOWN DISPOSABLE) ×3
GRAFT GORETEX STRT 4-7X45 (Vascular Products) ×2 IMPLANT
KIT BASIN OR (CUSTOM PROCEDURE TRAY) ×2 IMPLANT
KIT TURNOVER KIT B (KITS) ×2 IMPLANT
NS IRRIG 1000ML POUR BTL (IV SOLUTION) ×2 IMPLANT
PACK CV ACCESS (CUSTOM PROCEDURE TRAY) ×2 IMPLANT
PAD ARMBOARD 7.5X6 YLW CONV (MISCELLANEOUS) ×4 IMPLANT
SUT PROLENE 6 0 CC (SUTURE) ×6 IMPLANT
SUT SILK 2 0 PERMA HAND 18 BK (SUTURE) ×2 IMPLANT
SUT VIC AB 3-0 SH 27 (SUTURE) ×2
SUT VIC AB 3-0 SH 27X BRD (SUTURE) ×2 IMPLANT
SYR TOOMEY 50ML (SYRINGE) IMPLANT
TOWEL GREEN STERILE (TOWEL DISPOSABLE) ×2 IMPLANT
UNDERPAD 30X30 (UNDERPADS AND DIAPERS) ×2 IMPLANT
WATER STERILE IRR 1000ML POUR (IV SOLUTION) ×2 IMPLANT

## 2019-10-05 NOTE — Interval H&P Note (Signed)
History and Physical Interval Note:  10/05/2019 10:50 AM  Melissa Howe  has presented today for surgery, with the diagnosis of CHRONIC KIDNEY DISEASE FOR HEMODIALYSIS ACCESS.  The various methods of treatment have been discussed with the patient and family. After consideration of risks, benefits and other options for treatment, the patient has consented to  Procedure(s): INSERTION OF ARTERIOVENOUS (AV) GORE-TEX GRAFT LEFT ARM (Left) as a surgical intervention.  The patient's history has been reviewed, patient examined, no change in status, stable for surgery.  I have reviewed the patient's chart and labs.  Questions were answered to the patient's satisfaction.     Curt Jews

## 2019-10-05 NOTE — Progress Notes (Signed)
Spoke with Dr. Conrad [place] regarding Hgb 7.8, HCT 23. Aware of results.

## 2019-10-05 NOTE — Op Note (Signed)
    OPERATIVE REPORT  DATE OF SURGERY: 10/05/2019  PATIENT: Melissa Howe, 68 y.o. female MRN: [id number]  DOB: [birth year]  PRE-OPERATIVE DIAGNOSIS: Chronic renal insufficiency  POST-OPERATIVE DIAGNOSIS:  Same  PROCEDURE: Left forearm loop AV Gore-Tex graft  SURGEON:  Curt Jews, M.D.  PHYSICIAN ASSISTANT: Nurse  ANESTHESIA: Local with sedation  EBL: per anesthesia record  Total I/O In: 500 [I.V.:500] Out: -   BLOOD ADMINISTERED: none  DRAINS: none  SPECIMEN: none  COUNTS CORRECT:  YES  PATIENT DISPOSITION:  PACU - hemodynamically stable  PROCEDURE DETAILS: Patient was taken operating placed supine position where the area of the left arm was prepped and draped you sterile fashion.  SonoSite ultrasound was used to visualize the vein which showed small basilic and cephalic vein which had been seen in preoperative vein mapping.  Decision was made to proceed with prostatic graft.  Incision was made over the antecubital space using local anesthesia and the brachial artery and brachial vein were identified.  The vein did appear to be adequate for venous outflow.  Separate incision was made over the distal forearm in a loop configuration tunnel was created.  A 4 x 7 tapered Gore-Tex graft was brought through the tunnel.  The brachial artery was occluded proximally distally and a small arteriotomy was created longitudinally.  The 4 mm portion of the graft was slightly spatulated to around 5 mm and was sewn end-to-side to the brachial artery with a running 6-0 Prolene suture.  This anastomosis was tested and found to be adequate.  The graft was flushed heparinized saline and reoccluded.  Next the brachial vein was occluded proximally distally was opened 11 blade similarly with Potts scissors.  A 7 mm portion of the graft was spatulated and sewn end-to-side to the vein with a running 6-0 Prolene suture.  Clamps removed and excellent thrill was noted.  The patient maintained a 2+ left  radial pulse.  The wounds were irrigated with saline.  Hemostasis to electrocautery.  The wounds were closed with 3-0 Vicryl in the subcutaneous septic tissue.  Sterile dressing was applied and the patient was transferred to the recovery room in stable condition   [name], M.D., [facility] 10/05/2019 1:09 PM

## 2019-10-05 NOTE — Anesthesia Preprocedure Evaluation (Signed)
Anesthesia Evaluation  Patient identified by MRN, date of birth, ID band Patient awake    Reviewed: Allergy & Precautions, NPO status , Patient's Chart, lab work & pertinent test results  History of Anesthesia Complications (+) PONV  Airway Mallampati: I  TM Distance: >3 FB Neck ROM: Full    Dental   Pulmonary former smoker,    Pulmonary exam normal        Cardiovascular hypertension, Pt. on medications Normal cardiovascular exam     Neuro/Psych Depression    GI/Hepatic   Endo/Other  diabetes, Type 2, Insulin Dependent  Renal/GU Renal InsufficiencyRenal disease     Musculoskeletal   Abdominal   Peds  Hematology   Anesthesia Other Findings   Reproductive/Obstetrics                             Anesthesia Physical Anesthesia Plan  ASA: III  Anesthesia Plan: MAC   Post-op Pain Management:    Induction: Intravenous  PONV Risk Score and Plan: 3 and Ondansetron, Midazolam and Treatment may vary due to age or medical condition  Airway Management Planned: Nasal Cannula  Additional Equipment:   Intra-op Plan:   Post-operative Plan:   Informed Consent: I have reviewed the patients History and Physical, chart, labs and discussed the procedure including the risks, benefits and alternatives for the proposed anesthesia with the patient or authorized representative who has indicated his/her understanding and acceptance.       Plan Discussed with: CRNA and Surgeon  Anesthesia Plan Comments:         Anesthesia Quick Evaluation

## 2019-10-05 NOTE — Anesthesia Postprocedure Evaluation (Signed)
Anesthesia Post Note  Patient: Melissa Howe  Procedure(s) Performed: INSERTION OF ARTERIOVENOUS (AV) GORE-TEX VASCULAR GRAFT LEFT ARM (Left Arm Lower)     Patient location during evaluation: PACU Anesthesia Type: MAC Level of consciousness: awake and alert Pain management: pain level controlled Vital Signs Assessment: post-procedure vital signs reviewed and stable Respiratory status: spontaneous breathing, nonlabored ventilation, respiratory function stable and patient connected to nasal cannula oxygen Cardiovascular status: blood pressure returned to baseline and stable Postop Assessment: no apparent nausea or vomiting Anesthetic complications: no    Last Vitals:  Vitals:   10/05/19 1310 10/05/19 1325  BP: 128/63 123/63  Pulse: 66 68  Resp: 18 17  Temp:  (!) 36.2 C  SpO2: 97% 97%    Last Pain:  Vitals:   10/05/19 1325  TempSrc:   PainSc: 0-No pain                 Anwar Sakata DAVID

## 2019-10-05 NOTE — Transfer of Care (Signed)
Immediate Anesthesia Transfer of Care Note  Patient: Melissa Howe  Procedure(s) Performed: INSERTION OF ARTERIOVENOUS (AV) GORE-TEX VASCULAR GRAFT LEFT ARM (Left Arm Lower)  Patient Location: PACU  Anesthesia Type:MAC  Level of Consciousness: awake, patient cooperative and responds to stimulation  Airway & Oxygen Therapy: Patient Spontanous Breathing and Patient connected to face mask oxygen  Post-op Assessment: Report given to RN and Post -op Vital signs reviewed and stable  Post vital signs: Reviewed and stable  Last Vitals:  Vitals Value Taken Time  BP 110/57 10/05/19 1256  Temp    Pulse 62 10/05/19 1256  Resp 17 10/05/19 1256  SpO2 100 % 10/05/19 1256  Vitals shown include unvalidated device data.  Last Pain:  Vitals:   10/05/19 0919  TempSrc: Oral  PainSc:       Patients Stated Pain Goal: 3 (88/82/80 0349)  Complications: No apparent anesthesia complications

## 2019-10-05 NOTE — Discharge Instructions (Signed)
Monitored Anesthesia Care Anesthesia is a term that refers to techniques, procedures, and medicines that help a person stay safe and comfortable during a medical procedure. Monitored anesthesia care, or sedation, is one type of anesthesia. Your anesthesia specialist may recommend sedation if you will be having a procedure that does not require you to be unconscious, such as:  Cataract surgery.  A dental procedure.  A biopsy.  A colonoscopy. During the procedure, you may receive a medicine to help you relax (sedative). There are three levels of sedation:  Mild sedation. At this level, you may feel awake and relaxed. You will be able to follow directions.  Moderate sedation. At this level, you will be sleepy. You may not remember the procedure.  Deep sedation. At this level, you will be asleep. You will not remember the procedure. The more medicine you are given, the deeper your level of sedation will be. Depending on how you respond to the procedure, the anesthesia specialist may change your level of sedation or the type of anesthesia to fit your needs. An anesthesia specialist will monitor you closely during the procedure. Let your health care provider know about:  Any allergies you have.  All medicines you are taking, including vitamins, herbs, eye drops, creams, and over-the-counter medicines.  Any use of steroids (by mouth or as a cream).  Any problems you or family members have had with sedatives and anesthetic medicines.  Any blood disorders you have.  Any surgeries you have had.  Any medical conditions you have, such as sleep apnea.  Whether you are pregnant or may be pregnant.  Any use of cigarettes, alcohol, or street drugs. What are the risks? Generally, this is a safe procedure. However, problems may occur, including:  Getting too much medicine (oversedation).  Nausea.  Allergic reaction to medicines.  Trouble breathing. If this happens, a breathing tube may be  used to help with breathing. It will be removed when you are awake and breathing on your own.  Heart trouble.  Lung trouble. Before the procedure Staying hydrated Follow instructions from your health care provider about hydration, which may include:  Up to 2 hours before the procedure - you may continue to drink clear liquids, such as water, clear fruit juice, black coffee, and plain tea. Eating and drinking restrictions Follow instructions from your health care provider about eating and drinking, which may include:  8 hours before the procedure - stop eating heavy meals or foods such as meat, fried foods, or fatty foods.  6 hours before the procedure - stop eating light meals or foods, such as toast or cereal.  6 hours before the procedure - stop drinking milk or drinks that contain milk.  2 hours before the procedure - stop drinking clear liquids. Medicines Ask your health care provider about:  Changing or stopping your regular medicines. This is especially important if you are taking diabetes medicines or blood thinners.  Taking medicines such as aspirin and ibuprofen. These medicines can thin your blood. Do not take these medicines before your procedure if your health care provider instructs you not to. Tests and exams  You will have a physical exam.  You may have blood tests done to show: ? How well your kidneys and liver are working. ? How well your blood can clot. General instructions  Plan to have someone take you home from the hospital or clinic.  If you will be going home right after the procedure, plan to have someone with you  for 24 hours.  What happens during the procedure?  Your blood pressure, heart rate, breathing, level of pain and overall condition will be monitored.  An IV tube will be inserted into one of your veins.  Your anesthesia specialist will give you medicines as needed to keep you comfortable during the procedure. This may mean changing the  level of sedation.  The procedure will be performed. After the procedure  Your blood pressure, heart rate, breathing rate, and blood oxygen level will be monitored until the medicines you were given have worn off.  Do not drive for 24 hours if you received a sedative.  You may: ? Feel sleepy, clumsy, or nauseous. ? Feel forgetful about what happened after the procedure. ? Have a sore throat if you had a breathing tube during the procedure. ? Vomit. This information is not intended to replace advice given to you by your health care provider. Make sure you discuss any questions you have with your health care provider. Document Released: 07/09/2005 Document Revised: 09/25/2017 Document Reviewed: 02/03/2016 Elsevier Patient Education  2020 [employer].   [facility]  Discharge Instructions  AV Fistula or Graft Surgery for Dialysis Access  Please refer to the following instructions for your post-procedure care. Your surgeon or physician assistant will discuss any changes with you.  Activity  You may drive the day following your surgery, if you are comfortable and no longer taking prescription pain medication. Resume full activity as the soreness in your incision resolves.  Bathing/Showering  You may shower after you go home. Keep your incision dry for 48 hours. Do not soak in a bathtub, hot tub, or swim until the incision heals completely. You may not shower if you have a hemodialysis catheter.  Incision Care  Clean your incision with mild soap and water after 48 hours. Pat the area dry with a clean towel. You do not need a bandage unless otherwise instructed. Do not apply any ointments or creams to your incision. You may have skin glue on your incision. Do not peel it off. It will come off on its own in about one week. Your arm may swell a bit after surgery. To reduce swelling use pillows to elevate your arm so it is above your heart. Your doctor  will tell you if you need to lightly wrap your arm with an ACE bandage.  Diet  Resume your normal diet. There are not special food restrictions following this procedure. In order to heal from your surgery, it is CRITICAL to get adequate nutrition. Your body requires vitamins, minerals, and protein. Vegetables are the best source of vitamins and minerals. Vegetables also provide the perfect balance of protein. Processed food has little nutritional value, so try to avoid this.  Medications  Resume taking all of your medications. If your incision is causing pain, you may take over-the counter pain relievers such as acetaminophen (Tylenol). If you were prescribed a stronger pain medication, please be aware these medications can cause nausea and constipation. Prevent nausea by taking the medication with a snack or meal. Avoid constipation by drinking plenty of fluids and eating foods with high amount of fiber, such as fruits, vegetables, and grains.  Do not take Tylenol if you are taking prescription pain medications.  Follow up Your surgeon may want to see you in the office following your access surgery. If so, this will be arranged at the time of your surgery.  Please call us immediately for any of  the following conditions:  Increased pain, redness, drainage (pus) from your incision site Fever of 101 degrees or higher Severe or worsening pain at your incision site Hand pain or numbness.  Reduce your risk of vascular disease:  Stop smoking. If you would like help, call [facility] at 1-800-QUIT-NOW 570-324-3010) or [place] at [place] your cholesterol Maintain a desired weight Control your diabetes Keep your blood pressure down  Dialysis  It will take several weeks to several months for your new dialysis access to be ready for use. Your surgeon will determine when it is okay to use it. Your nephrologist will continue to direct your dialysis. You can continue to use your  Permcath until your new access is ready for use.   10/05/2019 Melissa Howe [id number] 05/03/1951  Surgeon(s): Early, Arvilla Meres, MD  Procedure(s): INSERTION OF ARTERIOVENOUS (AV) GORE-TEX VASCULAR GRAFT LEFT ARM  x Do not stick graft for 4 weeks    If you have any questions, please call the office at [phone].

## 2019-10-06 ENCOUNTER — Encounter: Payer: Self-pay | Admitting: *Deleted

## 2019-10-12 NOTE — Discharge Instructions (Signed)

## 2019-10-13 ENCOUNTER — Encounter (HOSPITAL_COMMUNITY)
Admission: RE | Admit: 2019-10-13 | Discharge: 2019-10-13 | Disposition: A | Discharge status: Home / Self Care | Payer: Medicare Other | Source: Ambulatory Visit | Attending: Internal Medicine | Admitting: Internal Medicine

## 2019-10-13 ENCOUNTER — Other Ambulatory Visit: Payer: Self-pay

## 2019-10-13 VITALS — BP 165/78 | HR 76 | Temp 96.3°F | Resp 18

## 2019-10-13 DIAGNOSIS — N189 Chronic kidney disease, unspecified: Secondary | ICD-10-CM | POA: Diagnosis present

## 2019-10-13 DIAGNOSIS — N184 Chronic kidney disease, stage 4 (severe): Secondary | ICD-10-CM

## 2019-10-13 DIAGNOSIS — D631 Anemia in chronic kidney disease: Secondary | ICD-10-CM | POA: Diagnosis not present

## 2019-10-13 LAB — POCT HEMOGLOBIN-HEMACUE: Hemoglobin: 9.5 g/dL — ABNORMAL LOW (ref 12.0–15.0)

## 2019-10-13 MED ORDER — EPOETIN ALFA-EPBX 10000 UNIT/ML IJ SOLN
20000.0000 [IU] | INTRAMUSCULAR | Status: DC
  Administered 2019-10-13: 20000 [IU] via SUBCUTANEOUS

## 2019-10-13 MED ORDER — EPOETIN ALFA-EPBX 10000 UNIT/ML IJ SOLN
INTRAMUSCULAR | Status: AC
  Filled 2019-10-13: qty 2

## 2019-10-27 ENCOUNTER — Encounter (HOSPITAL_COMMUNITY): Payer: Medicare Other

## 2019-11-01 ENCOUNTER — Ambulatory Visit (INDEPENDENT_AMBULATORY_CARE_PROVIDER_SITE_OTHER): Payer: Self-pay | Admitting: Physician Assistant

## 2019-11-01 ENCOUNTER — Other Ambulatory Visit: Payer: Self-pay

## 2019-11-01 VITALS — BP 150/72 | HR 72 | Temp 97.0°F | Resp 16 | Ht 64.0 in | Wt 173.0 lb

## 2019-11-01 DIAGNOSIS — N184 Chronic kidney disease, stage 4 (severe): Secondary | ICD-10-CM

## 2019-11-01 NOTE — Progress Notes (Signed)
    Postoperative Access Visit   History of Present Illness   Tamaris Valcin is a 69 y.o. year old female who presents for postoperative follow-up for: left forearm arteriovenous graft by Dr. Donnetta Hutching (Date: 10/05/19).  The patient's wounds are healed.  The patient denies steal symptoms.  The patient is able to complete their activities of daily living.  She is not yet on hemodialysis.  She has a follow up appointment with her Nephrologist next week.   Physical Examination   Vitals:   11/01/19 1416  BP: (!) 150/72  Pulse: 72  Resp: 16  Temp: (!) 97 F (36.1 C)  TempSrc: Oral  SpO2: 99%  Weight: 173 lb (78.5 kg)  Height: 5\' 4"  (1.626 m)   Body mass index is 29.7 kg/m.  left arm Incisions are healed, palpable L radial pulse, hand grip is 5/5, sensation in digits is intact, palpable thrill, bruit can be auscultated     Medical Decision Making   Zury Hauber is a 68 y.o. year old female who presents s/p left forearm arteriovenous graft   Patent L forearm loop graft without signs or symptoms of steal syndrome  The patient's access is ready for use whenever dialysis is initiated  The patient may follow up on a prn basis   Dagoberto Ligas PA-C [facility] [place] Office: [phone]  Clinic MD: Early

## 2019-11-10 ENCOUNTER — Encounter (HOSPITAL_COMMUNITY): Payer: Medicare Other

## 2019-11-15 ENCOUNTER — Ambulatory Visit
Admission: RE | Admit: 2019-11-15 | Discharge: 2019-11-15 | Disposition: A | Discharge status: Home / Self Care | Payer: Medicare Other | Source: Ambulatory Visit | Attending: Family | Admitting: Family

## 2019-11-15 ENCOUNTER — Other Ambulatory Visit: Payer: Self-pay

## 2019-11-15 DIAGNOSIS — Z1231 Encounter for screening mammogram for malignant neoplasm of breast: Secondary | ICD-10-CM

## 2019-11-16 DIAGNOSIS — T7840XA Allergy, unspecified, initial encounter: Secondary | ICD-10-CM | POA: Insufficient documentation

## 2019-11-16 DIAGNOSIS — N189 Chronic kidney disease, unspecified: Secondary | ICD-10-CM | POA: Insufficient documentation

## 2019-11-16 DIAGNOSIS — D689 Coagulation defect, unspecified: Secondary | ICD-10-CM | POA: Insufficient documentation

## 2019-11-23 DIAGNOSIS — E1151 Type 2 diabetes mellitus with diabetic peripheral angiopathy without gangrene: Secondary | ICD-10-CM | POA: Insufficient documentation

## 2019-11-24 DIAGNOSIS — N2581 Secondary hyperparathyroidism of renal origin: Secondary | ICD-10-CM | POA: Insufficient documentation

## 2019-11-28 DIAGNOSIS — E440 Moderate protein-calorie malnutrition: Secondary | ICD-10-CM | POA: Insufficient documentation

## 2019-11-30 DIAGNOSIS — Z23 Encounter for immunization: Secondary | ICD-10-CM | POA: Insufficient documentation

## 2019-12-09 ENCOUNTER — Ambulatory Visit: Payer: Medicare Other | Admitting: Podiatry

## 2019-12-15 DIAGNOSIS — D509 Iron deficiency anemia, unspecified: Secondary | ICD-10-CM | POA: Insufficient documentation

## 2019-12-18 ENCOUNTER — Ambulatory Visit: Payer: Medicare Other | Attending: Internal Medicine

## 2019-12-18 DIAGNOSIS — Z23 Encounter for immunization: Secondary | ICD-10-CM | POA: Insufficient documentation

## 2019-12-18 NOTE — Progress Notes (Signed)
   Covid-19 Vaccination Clinic  Name:  Melissa Howe    MRN: [id number] DOB: [birth year]  12/18/2019  Melissa Howe was observed post Covid-19 immunization for 15 minutes without incidence. She was provided with Vaccine Information Sheet and instruction to access the V-Safe system.   Melissa Howe was instructed to call 911 with any severe reactions post vaccine: Marland Kitchen Difficulty breathing  . Swelling of your face and throat  . A fast heartbeat  . A bad rash all over your body  . Dizziness and weakness    Immunizations Administered    Name Date Dose VIS Date Route   Pfizer COVID-19 Vaccine 12/18/2019  9:24 AM 0.3 mL 10/07/2019 Intramuscular   Manufacturer: [place]   Lot: [id number]   [place]: [id number]

## 2019-12-29 ENCOUNTER — Emergency Department (HOSPITAL_COMMUNITY): Payer: Medicare Other

## 2019-12-29 ENCOUNTER — Other Ambulatory Visit: Payer: Self-pay

## 2019-12-29 ENCOUNTER — Inpatient Hospital Stay (HOSPITAL_COMMUNITY)
Admission: EM | Admit: 2019-12-29 | Discharge: 2020-01-03 | DRG: 193 | Disposition: A | Discharge status: Home / Self Care | Payer: Medicare Other | Attending: Internal Medicine | Admitting: Internal Medicine

## 2019-12-29 ENCOUNTER — Encounter (HOSPITAL_COMMUNITY): Payer: Self-pay

## 2019-12-29 DIAGNOSIS — Z20822 Contact with and (suspected) exposure to covid-19: Secondary | ICD-10-CM | POA: Diagnosis present

## 2019-12-29 DIAGNOSIS — B9561 Methicillin susceptible Staphylococcus aureus infection as the cause of diseases classified elsewhere: Secondary | ICD-10-CM | POA: Diagnosis present

## 2019-12-29 DIAGNOSIS — Z7982 Long term (current) use of aspirin: Secondary | ICD-10-CM | POA: Diagnosis not present

## 2019-12-29 DIAGNOSIS — I5032 Chronic diastolic (congestive) heart failure: Secondary | ICD-10-CM | POA: Diagnosis present

## 2019-12-29 DIAGNOSIS — Z794 Long term (current) use of insulin: Secondary | ICD-10-CM | POA: Diagnosis not present

## 2019-12-29 DIAGNOSIS — J189 Pneumonia, unspecified organism: Secondary | ICD-10-CM | POA: Diagnosis present

## 2019-12-29 DIAGNOSIS — E785 Hyperlipidemia, unspecified: Secondary | ICD-10-CM | POA: Diagnosis present

## 2019-12-29 DIAGNOSIS — D631 Anemia in chronic kidney disease: Secondary | ICD-10-CM | POA: Diagnosis present

## 2019-12-29 DIAGNOSIS — Z87891 Personal history of nicotine dependence: Secondary | ICD-10-CM

## 2019-12-29 DIAGNOSIS — Z833 Family history of diabetes mellitus: Secondary | ICD-10-CM

## 2019-12-29 DIAGNOSIS — R011 Cardiac murmur, unspecified: Secondary | ICD-10-CM | POA: Diagnosis not present

## 2019-12-29 DIAGNOSIS — I132 Hypertensive heart and chronic kidney disease with heart failure and with stage 5 chronic kidney disease, or end stage renal disease: Secondary | ICD-10-CM | POA: Diagnosis present

## 2019-12-29 DIAGNOSIS — E1165 Type 2 diabetes mellitus with hyperglycemia: Secondary | ICD-10-CM | POA: Diagnosis present

## 2019-12-29 DIAGNOSIS — Z79891 Long term (current) use of opiate analgesic: Secondary | ICD-10-CM | POA: Diagnosis not present

## 2019-12-29 DIAGNOSIS — Z992 Dependence on renal dialysis: Secondary | ICD-10-CM

## 2019-12-29 DIAGNOSIS — I120 Hypertensive chronic kidney disease with stage 5 chronic kidney disease or end stage renal disease: Secondary | ICD-10-CM | POA: Diagnosis not present

## 2019-12-29 DIAGNOSIS — J9601 Acute respiratory failure with hypoxia: Secondary | ICD-10-CM | POA: Diagnosis present

## 2019-12-29 DIAGNOSIS — N186 End stage renal disease: Secondary | ICD-10-CM | POA: Diagnosis present

## 2019-12-29 DIAGNOSIS — Z79899 Other long term (current) drug therapy: Secondary | ICD-10-CM

## 2019-12-29 DIAGNOSIS — E1122 Type 2 diabetes mellitus with diabetic chronic kidney disease: Secondary | ICD-10-CM | POA: Diagnosis present

## 2019-12-29 DIAGNOSIS — R7881 Bacteremia: Secondary | ICD-10-CM | POA: Diagnosis present

## 2019-12-29 DIAGNOSIS — I340 Nonrheumatic mitral (valve) insufficiency: Secondary | ICD-10-CM | POA: Diagnosis not present

## 2019-12-29 DIAGNOSIS — Z8249 Family history of ischemic heart disease and other diseases of the circulatory system: Secondary | ICD-10-CM | POA: Diagnosis not present

## 2019-12-29 DIAGNOSIS — N2581 Secondary hyperparathyroidism of renal origin: Secondary | ICD-10-CM | POA: Diagnosis present

## 2019-12-29 DIAGNOSIS — I5030 Unspecified diastolic (congestive) heart failure: Secondary | ICD-10-CM | POA: Diagnosis not present

## 2019-12-29 DIAGNOSIS — R0600 Dyspnea, unspecified: Secondary | ICD-10-CM | POA: Diagnosis not present

## 2019-12-29 DIAGNOSIS — I10 Essential (primary) hypertension: Secondary | ICD-10-CM | POA: Diagnosis present

## 2019-12-29 LAB — I-STAT CHEM 8, ED
BUN: 39 mg/dL — ABNORMAL HIGH (ref 8–23)
Calcium, Ion: 0.97 mmol/L — ABNORMAL LOW (ref 1.15–1.40)
Chloride: 99 mmol/L (ref 98–111)
Creatinine, Ser: 5.1 mg/dL — ABNORMAL HIGH (ref 0.44–1.00)
Glucose, Bld: 338 mg/dL — ABNORMAL HIGH (ref 70–99)
HCT: 34 % — ABNORMAL LOW (ref 36.0–46.0)
Hemoglobin: 11.6 g/dL — ABNORMAL LOW (ref 12.0–15.0)
Potassium: 4.2 mmol/L (ref 3.5–5.1)
Sodium: 133 mmol/L — ABNORMAL LOW (ref 135–145)
TCO2: 32 mmol/L (ref 22–32)

## 2019-12-29 LAB — CBC
HCT: 34.9 % — ABNORMAL LOW (ref 36.0–46.0)
Hemoglobin: 10.8 g/dL — ABNORMAL LOW (ref 12.0–15.0)
MCH: 27.7 pg (ref 26.0–34.0)
MCHC: 30.9 g/dL (ref 30.0–36.0)
MCV: 89.5 fL (ref 80.0–100.0)
Platelets: 265 10*3/uL (ref 150–400)
RBC: 3.9 MIL/uL (ref 3.87–5.11)
RDW: 14.6 % (ref 11.5–15.5)
WBC: 17.4 10*3/uL — ABNORMAL HIGH (ref 4.0–10.5)
nRBC: 0 % (ref 0.0–0.2)

## 2019-12-29 LAB — LACTIC ACID, PLASMA: Lactic Acid, Venous: 1.4 mmol/L (ref 0.5–1.9)

## 2019-12-29 LAB — BASIC METABOLIC PANEL
Anion gap: 16 — ABNORMAL HIGH (ref 5–15)
BUN: 38 mg/dL — ABNORMAL HIGH (ref 8–23)
CO2: 24 mmol/L (ref 22–32)
Calcium: 9 mg/dL (ref 8.9–10.3)
Chloride: 95 mmol/L — ABNORMAL LOW (ref 98–111)
Creatinine, Ser: 4.87 mg/dL — ABNORMAL HIGH (ref 0.44–1.00)
GFR calc Af Amer: 10 mL/min — ABNORMAL LOW (ref >60)
GFR calc non Af Amer: 8 mL/min — ABNORMAL LOW (ref >60)
Glucose, Bld: 334 mg/dL — ABNORMAL HIGH (ref 70–99)
Potassium: 4.4 mmol/L (ref 3.5–5.1)
Sodium: 135 mmol/L (ref 135–145)

## 2019-12-29 LAB — RESPIRATORY PANEL BY RT PCR (FLU A&B, COVID)
Influenza A by PCR: NEGATIVE
Influenza B by PCR: NEGATIVE
SARS Coronavirus 2 by RT PCR: NEGATIVE

## 2019-12-29 LAB — HEPATIC FUNCTION PANEL
ALT: 30 U/L (ref 0–44)
AST: 40 U/L (ref 15–41)
Albumin: 3.4 g/dL — ABNORMAL LOW (ref 3.5–5.0)
Alkaline Phosphatase: 90 U/L (ref 38–126)
Bilirubin, Direct: 0.2 mg/dL (ref 0.0–0.2)
Indirect Bilirubin: 1 mg/dL — ABNORMAL HIGH (ref 0.3–0.9)
Total Bilirubin: 1.2 mg/dL (ref 0.3–1.2)
Total Protein: 7.2 g/dL (ref 6.5–8.1)

## 2019-12-29 LAB — PROTIME-INR
INR: 1.1 (ref 0.8–1.2)
Prothrombin Time: 13.9 seconds (ref 11.4–15.2)

## 2019-12-29 LAB — APTT: aPTT: 28 seconds (ref 24–36)

## 2019-12-29 LAB — GLUCOSE, CAPILLARY
Glucose-Capillary: 129 mg/dL — ABNORMAL HIGH (ref 70–99)
Glucose-Capillary: 183 mg/dL — ABNORMAL HIGH (ref 70–99)

## 2019-12-29 LAB — POC SARS CORONAVIRUS 2 AG -  ED: SARS Coronavirus 2 Ag: NEGATIVE

## 2019-12-29 MED ORDER — CARVEDILOL 6.25 MG PO TABS
6.2500 mg | ORAL_TABLET | Freq: Two times a day (BID) | ORAL | Status: DC
  Administered 2019-12-29 – 2020-01-02 (×8): 6.25 mg via ORAL
  Filled 2019-12-29 (×9): qty 1

## 2019-12-29 MED ORDER — SODIUM CHLORIDE 0.9 % IV SOLN
2.0000 g | Freq: Once | INTRAVENOUS | Status: AC
  Administered 2019-12-29: 10:00:00 2 g via INTRAVENOUS
  Filled 2019-12-29: qty 20

## 2019-12-29 MED ORDER — VANCOMYCIN HCL IN DEXTROSE 750-5 MG/150ML-% IV SOLN: 750.0000 mg | INTRAVENOUS | Status: DC

## 2019-12-29 MED ORDER — INSULIN DETEMIR 100 UNIT/ML ~~LOC~~ SOLN
8.0000 [IU] | Freq: Every day | SUBCUTANEOUS | Status: DC
  Administered 2019-12-29 – 2020-01-02 (×5): 8 [IU] via SUBCUTANEOUS
  Filled 2019-12-29 (×7): qty 0.08

## 2019-12-29 MED ORDER — HEPARIN SODIUM (PORCINE) 5000 UNIT/ML IJ SOLN
5000.0000 [IU] | Freq: Three times a day (TID) | INTRAMUSCULAR | Status: DC
  Administered 2019-12-29 – 2020-01-03 (×14): 5000 [IU] via SUBCUTANEOUS
  Filled 2019-12-29 (×14): qty 1

## 2019-12-29 MED ORDER — INSULIN ASPART 100 UNIT/ML ~~LOC~~ SOLN
0.0000 [IU] | Freq: Three times a day (TID) | SUBCUTANEOUS | Status: DC
  Administered 2019-12-30: 3 [IU] via SUBCUTANEOUS
  Administered 2019-12-30 (×2): 2 [IU] via SUBCUTANEOUS
  Administered 2019-12-31: 3 [IU] via SUBCUTANEOUS
  Administered 2019-12-31: 2 [IU] via SUBCUTANEOUS

## 2019-12-29 MED ORDER — ONDANSETRON 4 MG PO TBDP
4.0000 mg | ORAL_TABLET | Freq: Three times a day (TID) | ORAL | Status: DC | PRN
  Administered 2019-12-29: 4 mg via ORAL
  Filled 2019-12-29: qty 1

## 2019-12-29 MED ORDER — HEPARIN SODIUM (PORCINE) 1000 UNIT/ML DIALYSIS
20.0000 [IU]/kg | INTRAMUSCULAR | Status: DC | PRN
  Filled 2019-12-29: qty 2

## 2019-12-29 MED ORDER — SODIUM CHLORIDE 0.9 % IV SOLN
500.0000 mg | INTRAVENOUS | Status: DC
  Filled 2019-12-29: qty 500

## 2019-12-29 MED ORDER — ACETAMINOPHEN 650 MG RE SUPP: 650.0000 mg | Freq: Four times a day (QID) | RECTAL | Status: DC | PRN

## 2019-12-29 MED ORDER — ACETAMINOPHEN 325 MG PO TABS
650.0000 mg | ORAL_TABLET | Freq: Four times a day (QID) | ORAL | Status: DC | PRN
  Administered 2019-12-29 (×2): 650 mg via ORAL
  Filled 2019-12-29 (×2): qty 2

## 2019-12-29 MED ORDER — SODIUM CHLORIDE 0.9 % IV SOLN
2.0000 g | INTRAVENOUS | Status: DC
  Filled 2019-12-29: qty 20

## 2019-12-29 MED ORDER — VANCOMYCIN HCL 1500 MG/300ML IV SOLN
1500.0000 mg | Freq: Once | INTRAVENOUS | Status: AC
  Administered 2019-12-29: 1500 mg via INTRAVENOUS
  Filled 2019-12-29: qty 300

## 2019-12-29 MED ORDER — INSULIN ASPART 100 UNIT/ML ~~LOC~~ SOLN
3.0000 [IU] | Freq: Three times a day (TID) | SUBCUTANEOUS | Status: DC
  Administered 2019-12-30 – 2019-12-31 (×4): 3 [IU] via SUBCUTANEOUS

## 2019-12-29 MED ORDER — SODIUM CHLORIDE 0.9 % IV SOLN
500.0000 mg | Freq: Once | INTRAVENOUS | Status: AC
  Administered 2019-12-29: 500 mg via INTRAVENOUS
  Filled 2019-12-29: qty 500

## 2019-12-29 MED ORDER — CHLORHEXIDINE GLUCONATE CLOTH 2 % EX PADS
6.0000 | MEDICATED_PAD | Freq: Every day | CUTANEOUS | Status: DC
  Administered 2019-12-30 – 2020-01-03 (×5): 6 via TOPICAL

## 2019-12-29 MED ORDER — INSULIN ASPART 100 UNIT/ML ~~LOC~~ SOLN: 0.0000 [IU] | Freq: Every day | SUBCUTANEOUS | Status: DC

## 2019-12-29 NOTE — Progress Notes (Signed)
Pt with fever of 102.9. Pt then vomited. Cleaned pt up, changed linens and gown.  Notified provider on call for IMTS. Orders rec'd

## 2019-12-29 NOTE — Progress Notes (Signed)
Pt taken off bipap and placed on 5L [place] at this time. VS within normal limits. Pt denies SOB, no increased WOB. MD made aware. RT will continue to monitor for possible bipap needs again as she waits for HD

## 2019-12-29 NOTE — Procedures (Signed)
Patient seen on Hemodialysis. BP (!) 100/57 (BP Location: Right Arm)   Pulse 73   Temp 98.4 F (36.9 C) (Oral)   Resp 20   Ht 5\' 4"  (1.626 m)   Wt 75.8 kg   SpO2 100%   BMI 28.67 kg/m   QB 400 mL/ min via L forearm AVG UF goal 3.5L  Comfortable on BiPaP.  Has evidence of volume overload with JVD, LE edema, acute hypoxic RF.  Madelon Lips MD [facility] pgr [phone] 3:46 PM
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
## 2019-12-29 NOTE — Consult Note (Signed)
[facility] Renal Consultation Note    Indication for Consultation:  Management of ESRD/hemodialysis, anemia, hypertension/volume, and secondary hyperparathyroidism.  HPI: Melissa Howe is a 69 y.o. female with a history of ESRD on dialysis since 12/15/19, T2DM, HTN who presented to the ED on 12/29/19 with shortness of breath. Patient reports symptoms began suddenly last night. Patient called EMS this AM and O2 sat was 81% on RA. On presentation to the ED, T 103.68F, BP 180/100 > 113/58, O2 sat 100% on BiPAP. WBC 17.4, Hgb 11.6, K+ 4.2, Glu 338, BUN 39, Cr 5.10, Ca 9.0, Alb 3.4. Rapid covid and covid PCR tests both negative and patient denies sick contacts and/or increased fluid intake. Chest x-ray revealed subtle interstitial markings, either technique related or mild edema, hazy opacity in R lung base. She was started on azithromycin and ceftriaxone, blood cultures pending.   Patient currently on BiPAP. denies CP, palpitations, dizziness, abdominal pain, N/V/D. Not on any pain at present and is stable on BiPAP. She is complaint with HD and has been getting below her outpatient EDW. Due to HD today but came to the ED instead.   Past Medical History:  Diagnosis Date  . Chronic kidney disease   . Complication of anesthesia   . Constipation   . Diabetes mellitus    Type II  . Dyspnea    with exertion  . Hyperlipemia   . Hypertension   . Pneumonia 2019  . PONV (postoperative nausea and vomiting)    x 1    Past Surgical History:  Procedure Laterality Date  . APPENDECTOMY    . AV FISTULA PLACEMENT Left 10/05/2019   Procedure: INSERTION OF ARTERIOVENOUS (AV) GORE-TEX VASCULAR GRAFT LEFT ARM;  Surgeon: [name], MD;  Location: [facility];  Service: Vascular;  Laterality: Left;  . EYE SURGERY Bilateral    Cataract  . FOREIGN BODY REMOVAL Left 05/13/2018   Procedure: FOREIGN BODY REMOVAL left foot;  Surgeon: Meredith Pel, MD;  Location: [facility];  Service: Orthopedics;   Laterality: Left;  . I & D EXTREMITY Left 05/21/2018   Procedure: LEFT FOOT DEBRIDEMENT AND WOUND CLOSURE;  Surgeon: Newt Minion, MD;  Location: [facility];  Service: Orthopedics;  Laterality: Left;  . TONSILLECTOMY    . TUBAL LIGATION     Family History  Problem Relation Age of Onset  . Hypertension Mother   . Diabetes Mother   . Hypertension Sister    ROS: As per HPI otherwise negative.  Physical Exam: Vitals:   12/29/19 1000 12/29/19 1030 12/29/19 1045 12/29/19 1205  BP: (!) 162/87 (!) 146/73 138/72 (!) 113/58  Pulse: (!) 126 (!) 103 (!) 103 87  Resp: (!) 28 18 (!) 32 16  Temp:      TempSrc:      SpO2: 96% 96% 99% 100%  Weight:      Height:         General: Well developed female on BiPAP. Alert and oriented.  Head: Normocephalic, atraumatic, sclera non-icteric Neck: Supple without lymphadenopathy/masses. JVD not elevated. Lungs: Slightly tachypneic on BiPAP. Diffuses crackles L lung, no wheezing auscultated  Heart: RRR with normal S1, S2. No murmurs, rubs, or gallops appreciated. Abdomen: Soft, non-tender, non-distended with normoactive bowel sounds. No rebound/guarding. No obvious abdominal masses. Musculoskeletal:  Strength and tone appear normal for age. Lower extremities: 1+ edema bilateral lower extremities Neuro: Alert and oriented X 3. Moves all extremities spontaneously. Psych:  Responds to questions appropriately with a normal affect.  Dialysis Access: LUE AVG + thrill/ bruit  No Known Allergies Prior to Admission medications   Medication Sig Start Date End Date Taking? Authorizing Provider  acetaminophen (TYLENOL) 500 MG tablet Take 500 mg by mouth every 6 (six) hours as needed for moderate pain or headache.   Yes [provider]  amLODipine (NORVASC) 10 MG tablet Take 1 tablet (10 mg total) by mouth daily. 08/11/19 12/29/19 Yes Dahal, Marlowe Aschoff, MD  aspirin EC 81 MG tablet Take 1 tablet (81 mg total) by mouth daily. 09/08/16  Yes Tat, Shanon Brow, MD  carvedilol  (COREG) 12.5 MG tablet Take 0.5 tablets (6.25 mg total) by mouth 2 (two) times daily with a meal. 08/11/19 12/29/19 Yes Dahal, Marlowe Aschoff, MD  ferrous sulfate 325 (65 FE) MG tablet Take 325 mg by mouth daily.   Yes [provider]  Insulin Detemir (LEVEMIR) 100 UNIT/ML Pen Inject 18 Units into the skin daily with breakfast. 08/11/19  Yes Dahal, Marlowe Aschoff, MD  multivitamin (RENA-VIT) TABS tablet Take 1 tablet by mouth daily. 11/22/19  Yes [provider]  rosuvastatin (CRESTOR) 10 MG tablet Take 10 mg by mouth daily. 11/17/19  Yes [provider]  HYDROcodone-acetaminophen (NORCO/VICODIN) 5-325 MG tablet Take 1 tablet by mouth every 6 (six) hours as needed for moderate pain. Patient not taking: Reported on 12/29/2019 10/05/19   Gabriel Earing, PA-C  Insulin Pen Needle 32G X 4 MM MISC Use with insulin pen to dispense insulin as directed 09/08/16   Tat, Shanon Brow, MD  pentoxifylline (TRENTAL) 400 MG CR tablet Take 1 tablet (400 mg total) by mouth 3 (three) times daily with meals. Patient not taking: Reported on 12/29/2019 06/10/18   Newt Minion, MD  rosuvastatin (CRESTOR) 20 MG tablet Take 1 tablet (20 mg total) by mouth daily. Patient not taking: Reported on 12/29/2019 08/11/19 12/29/19  Terrilee Croak, MD   Current Facility-Administered Medications  Medication Dose Route Frequency Provider Last Rate Last Admin  . acetaminophen (TYLENOL) tablet 650 mg  650 mg Oral Q6H PRN Katherine Roan, MD   650 mg at 12/29/19 1057   Or  . acetaminophen (TYLENOL) suppository 650 mg  650 mg Rectal Q6H PRN Katherine Roan, MD      . Derrill Memo ON 12/30/2019] Chlorhexidine Gluconate Cloth 2 % PADS 6 each  6 each Topical Q0600 Naiara Lombardozzi G, PA-C      . heparin injection 5,000 Units  5,000 Units Subcutaneous Q8H Katherine Roan, MD       Current Outpatient Medications  Medication Sig Dispense Refill  . acetaminophen (TYLENOL) 500 MG tablet Take 500 mg by mouth every 6 (six) hours as needed for  moderate pain or headache.    Marland Kitchen amLODipine (NORVASC) 10 MG tablet Take 1 tablet (10 mg total) by mouth daily. 30 tablet 0  . aspirin EC 81 MG tablet Take 1 tablet (81 mg total) by mouth daily. 30 tablet 0  . carvedilol (COREG) 12.5 MG tablet Take 0.5 tablets (6.25 mg total) by mouth 2 (two) times daily with a meal. 30 tablet 0  . ferrous sulfate 325 (65 FE) MG tablet Take 325 mg by mouth daily.    . Insulin Detemir (LEVEMIR) 100 UNIT/ML Pen Inject 18 Units into the skin daily with breakfast. 15 mL 1  . multivitamin (RENA-VIT) TABS tablet Take 1 tablet by mouth daily.    . rosuvastatin (CRESTOR) 10 MG tablet Take 10 mg by mouth daily.    Marland Kitchen HYDROcodone-acetaminophen (NORCO/VICODIN) 5-325 MG tablet Take  1 tablet by mouth every 6 (six) hours as needed for moderate pain. (Patient not taking: Reported on 12/29/2019) 20 tablet 0  . Insulin Pen Needle 32G X 4 MM MISC Use with insulin pen to dispense insulin as directed 100 each 1  . pentoxifylline (TRENTAL) 400 MG CR tablet Take 1 tablet (400 mg total) by mouth 3 (three) times daily with meals. (Patient not taking: Reported on 12/29/2019) 90 tablet 3  . rosuvastatin (CRESTOR) 20 MG tablet Take 1 tablet (20 mg total) by mouth daily. (Patient not taking: Reported on 12/29/2019) 30 tablet 0   Labs: Basic Metabolic Panel: Recent Labs  Lab 12/29/19 0847 12/29/19 0856  NA 135 133*  K 4.4 4.2  CL 95* 99  CO2 24  --   GLUCOSE 334* 338*  BUN 38* 39*  CREATININE 4.87* 5.10*  CALCIUM 9.0  --    Liver Function Tests: Recent Labs  Lab 12/29/19 0847  AST 40  ALT 30  ALKPHOS 90  BILITOT 1.2  PROT 7.2  ALBUMIN 3.4*   CBC: Recent Labs  Lab 12/29/19 0847 12/29/19 0856  WBC 17.4*  --   HGB 10.8* 11.6*  HCT 34.9* 34.0*  MCV 89.5  --   PLT 265  --    Studies/Results: DG Chest Portable 1 View  Result Date: 12/29/2019 CLINICAL DATA:  Shortness of breath respiratory distress patient on CPAP EXAM: PORTABLE CHEST 1 VIEW COMPARISON:  08/13/2019 FINDINGS:  Cardiomediastinal contours are stable. Hilar structures with question of vascular engorgement. Subtle increased interstitial markings. Subtle opacity at the right base. No dense consolidation. No signs of pleural effusion. Visualized skeletal structures are unremarkable. IMPRESSION: Subtle increased interstitial markings some of which could be technique related or mild edema/volume overload. Hazy at the right lung base may represent developing infection. Electronically Signed   By: Zetta Bills M.D.   On: 12/29/2019 08:39    Dialysis Orders: Center: [facility]  on TTS. Time: 4 hours, BFR 400/DFR 800, 2K/2Ca, EDW 69kg, UF profile: none, LUE AVG Heparin 2200 unit bolus Mircera 75 mcg IV q 2 weeks- last dose 12mcg on 3/2 Venfoer 100 mg IV q HD x 10 doses- received 5 so far Calcitriol 0.25 mcg PO TID with HD  Assessment/Plan: 1.  Acute hypoxic respiratory failure: CXR with possible mild edema and L lower lobe opacity. Likely multifactorial due to pneumonia and hypervolemia. WBC elevated. COVID/influenza negative. Currently on BiPAP. Will plan for urgent HD today for volume. On empiric azithromycin and ceftriaxone.  2.  ESRD:  TTS, due for HD today. K+ controlled. Urgent HD as above 3.  Hypertension/volume: BP controlled, hypervolemic and well over EDW by weights here. Likely losing weight. UF 3-3.5L with HD as tolerated. 4.  Anemia: Hgb  11.6. ESA just dosed. Will hold venofer for now due to active infection.  5.  Metabolic bone disease: Calcium controlled, phos pending. Continue calcitriol, not on binder as outpatient.  6.  Nutrition:  Renal diet with fluid restrictions. 7. T2DM: On insulin, per primary.  McDonald, PA-C 12/29/2019, 12:27 PM  [facility] Pager: [phone]

## 2019-12-29 NOTE — ED Notes (Signed)
Purewick applied and pt provided two warm blankets. Pt does still produce urine.

## 2019-12-29 NOTE — Progress Notes (Signed)
PHARMACY - PHYSICIAN COMMUNICATION CRITICAL VALUE ALERT - BLOOD CULTURE IDENTIFICATION (BCID)  Melissa Howe is an 69 y.o. female who presented to [facility] on 12/29/2019 with a chief complaint of dyspnea.  Assessment:  1 of 2 BCx is growing GPC in clusters.  Being treated for CAP.  ESRD patient.  Tmax 103.2, WBC 17.4.  Name of physician (or Provider) Contacted:  Dr. Madilyn Fireman  Current antibiotics: azithromycin and ceftriaxone  Changes to prescribed antibiotics recommended:  Recommendations accepted by provider  Add vancomycin and repeat BCx  Results for orders placed or performed during the hospital encounter of 02/11/18  Blood Culture ID Panel (Reflexed) (Collected: 02/12/2018  5:18 AM)  Result Value Ref Range   Enterococcus species NOT DETECTED NOT DETECTED   Listeria monocytogenes NOT DETECTED NOT DETECTED   Staphylococcus species NOT DETECTED NOT DETECTED   Staphylococcus aureus (BCID) NOT DETECTED NOT DETECTED   Streptococcus species NOT DETECTED NOT DETECTED   Streptococcus agalactiae NOT DETECTED NOT DETECTED   Streptococcus pneumoniae NOT DETECTED NOT DETECTED   Streptococcus pyogenes NOT DETECTED NOT DETECTED   Acinetobacter baumannii NOT DETECTED NOT DETECTED   Enterobacteriaceae species NOT DETECTED NOT DETECTED   Enterobacter cloacae complex NOT DETECTED NOT DETECTED   Escherichia coli NOT DETECTED NOT DETECTED   Klebsiella oxytoca NOT DETECTED NOT DETECTED   Klebsiella pneumoniae NOT DETECTED NOT DETECTED   Proteus species NOT DETECTED NOT DETECTED   Serratia marcescens NOT DETECTED NOT DETECTED   Haemophilus influenzae NOT DETECTED NOT DETECTED   Neisseria meningitidis NOT DETECTED NOT DETECTED   Pseudomonas aeruginosa NOT DETECTED NOT DETECTED   Candida albicans NOT DETECTED NOT DETECTED   Candida glabrata NOT DETECTED NOT DETECTED   Candida krusei NOT DETECTED NOT DETECTED   Candida parapsilosis NOT DETECTED NOT DETECTED   Candida tropicalis NOT DETECTED NOT  DETECTED    Beau [place]burg D. Mina Marble, PharmD, BCPS, [place] 12/29/2019, 10:48 PM

## 2019-12-29 NOTE — Progress Notes (Signed)
Pharmacy Antibiotic Note  Melissa Howe is a 69 y.o. female admitted on 12/29/2019 with dyspnea and is being treat for CAP.  Blood culture is growing GPC in 1 of 2 bottles.  Given that patient is febrile and has leukocytosis, Pharmacy has been consulted to add vancomycin for rule out bacteremia.  Repeat blood cultures ordered.  Patient has ESRD and received dialysis today. Tmax 103.2, WBC 1.4, LA 1.4.  Plan: Vanc 1500mg  IV x 1, then 750mg  IV qHD TTS Continue Rocephin 2gm IV Q24H per MD Continue Azith 500mg  IV Q24H per MD Monitor HD schedule/tolerance, micro data, abx de-escalation plans   Height: 5\' 4"  (162.6 cm) Weight: (unable to obtain weight) IBW/kg (Calculated) : 54.7  Temp (24hrs), Avg:99.9 F (37.7 C), Min:98.1 F (36.7 C), Max:103.2 F (39.6 C)  Recent Labs  Lab 12/29/19 0847 12/29/19 0856  WBC 17.4*  --   CREATININE 4.87* 5.10*  LATICACIDVEN 1.4  --     Estimated Creatinine Clearance: 10.4 mL/min (A) (by C-G formula based on SCr of 5.1 mg/dL (H)).    No Known Allergies  CTX 3/4 >> Azith 3/4 >> Vanc 3/4 >>   3/4 covid / flu - negative 3/4 BCx (AM) - 1/2 GPC clusters 3/4 BCx (PM)  Loriel Diehl D. Mina Marble, PharmD, BCPS, [place] 12/29/2019, 10:54 PM

## 2019-12-29 NOTE — Progress Notes (Signed)
Received pt from home via EMS due to respiratory distress. Pt on cpap enroute but was placed on NRB prior to ED. Pt with continued increased WOB, Increased RR, Slight respiratory distress. Pt has had 1st covid vaccine, waiting on 2nd. Pt placed on bipap per MD. Pt states breathing is better with bipap on vs NRB. RT will attempt to wean off bipap as tolerated.

## 2019-12-29 NOTE — Progress Notes (Signed)
PPV held due to nausea and vomiting.

## 2019-12-29 NOTE — Progress Notes (Signed)
Pt called stating she was having Increased WOB and was SOB. Pt in slight distress at this time. Pt placed back on bipap on previous settings. RN aware. RT will monitor closely.

## 2019-12-29 NOTE — ED Triage Notes (Signed)
Pt brought to ED via EMS from home with c/o shortness of breath that started last night and worsened this AM. EMS reports pt was 81% on RA, NRB applied and pt increased to 95-98%. T-TH-SA dialysis pt. Pt denies pain, dizziness, n/v. A&O x4.

## 2019-12-29 NOTE — H&P (Signed)
Date: 12/29/2019               Patient Name:  Melissa Howe MRN: [id number]  DOB: Sep 01, 1951 Age / Sex: 69 y.o., female   PCP: Sonia Side., FNP         Medical Service: Internal Medicine Teaching Service         Attending Physician: Dr. Heber [place], Rachel Moulds, DO    First Contact: Sharon Seller, DO, Jaimie Pager: Jsea [phone])  Second Contact: Ronnald Ramp, MD, Dorian Pod Pager: EJ [phone])       After Hours (After 5p/  First Contact Pager: [phone]  weekends / holidays): Second Contact Pager: [phone]   Chief Complaint: dyspnea  History of Present Illness: 69 y.o. yo female w/ PMH significant for ESRD on TThSa dialysis fairly new to dialysis, T2DM, HFpEF.  History somewhat limited as pt still on bipap.  Pt is alert and oriented breathing comfortably on bipap.  She reports increasing shortness of breath over the last two days.  She denies cough, chest pain, N/V, diarrhea, subjective fevers or chills.  She denies any covid exposure.  She denies missing any dialysis sessions.  She does tell me her weight has been fluctuating and they were needing to adjust her dry weight.  She has had several episodes of pneumonia in the past.  She is not in pain and has had improvement in her breathing.    Meds:  Current Meds  Medication Sig  . acetaminophen (TYLENOL) 500 MG tablet Take 500 mg by mouth every 6 (six) hours as needed for moderate pain or headache.  Marland Kitchen amLODipine (NORVASC) 10 MG tablet Take 1 tablet (10 mg total) by mouth daily.  Marland Kitchen aspirin EC 81 MG tablet Take 1 tablet (81 mg total) by mouth daily.  . carvedilol (COREG) 12.5 MG tablet Take 0.5 tablets (6.25 mg total) by mouth 2 (two) times daily with a meal.  . ferrous sulfate 325 (65 FE) MG tablet Take 325 mg by mouth daily.  . Insulin Detemir (LEVEMIR) 100 UNIT/ML Pen Inject 18 Units into the skin daily with breakfast.  . multivitamin (RENA-VIT) TABS tablet Take 1 tablet by mouth daily.  . rosuvastatin (CRESTOR) 10 MG tablet Take 10 mg by  mouth daily.     Allergies: Allergies as of 12/29/2019  . (No Known Allergies)   Past Medical History:  Diagnosis Date  . Chronic kidney disease   . Complication of anesthesia   . Constipation   . Diabetes mellitus    Type II  . Dyspnea    with exertion  . Hyperlipemia   . Hypertension   . Pneumonia 2019  . PONV (postoperative nausea and vomiting)    x 1     Family History:  Family History  Problem Relation Age of Onset  . Hypertension Mother   . Diabetes Mother   . Hypertension Sister      Social History:  Social History   Tobacco Use  . Smoking status: Former Smoker    Years: 4.00    Types: Cigarettes    Quit date: 01/19/1984    Years since quitting: 35.9  . Smokeless tobacco: Never Used  Substance Use Topics  . Alcohol use: No  . Drug use: No     Review of Systems: A complete ROS was negative except as per HPI.   Physical Exam: Blood pressure (!) 113/58, pulse 87, temperature (!) 103.2 F (39.6 C), temperature source Rectal, resp. rate 16, height 5\' 4"  (  1.626 m), weight 75.8 kg, SpO2 100 %. Physical Exam Constitutional:      General: She is not in acute distress.    Appearance: She is not diaphoretic.  HENT:     Head: Normocephalic and atraumatic.  Cardiovascular:     Rate and Rhythm: Regular rhythm. Tachycardia present.     Heart sounds: Normal heart sounds. No murmur. No friction rub. No gallop.   Pulmonary:     Effort: Pulmonary effort is normal. No respiratory distress.     Breath sounds: Examination of the right-lower field reveals rhonchi. Examination of the left-lower field reveals rhonchi. Rhonchi present. No wheezing.  Chest:     Chest wall: No tenderness.  Abdominal:     General: Bowel sounds are normal. There is no distension.     Palpations: Abdomen is soft. There is no mass.     Tenderness: There is no abdominal tenderness. There is no guarding or rebound.  Musculoskeletal:     Right lower leg: No edema.     Left lower leg: No  edema.  Skin:    General: Skin is warm and dry.  Neurological:     Mental Status: She is alert and oriented to person, place, and time. Mental status is at baseline.  Psychiatric:        Mood and Affect: Mood normal.        Behavior: Behavior normal.      EKG: personally reviewed my interpretation is sinus tach with LVH  CXR: personally reviewed my interpretation is interstitial edema, cardiomegaly worsened since prior film one year ago, RLL mild opacification  Assessment & Plan by Problem: Principal Problem:   CAP (community acquired pneumonia) Active Problems:   Acute respiratory failure with hypoxemia (HCC)  CAP/Acute respiratory failure: pt with s/s and imaging concerning for CAP, covid and flu negative, additionally she has signs of pulmonary edema and volume overload contributing.  -continue ceftriaxone azithro -Support with bipap to HD today with UF removal   ESRD: on TTS dialysis, compliant with outpatient HD  -HD today, appreciate nephrology assistance  T2DM: no recent A1C in our system, on levemir at home  -SSI, mealtime and levemir here  HTN: on carvedilol and amlodipine at home  -start back carvedilol this evening if bp allows, hold amlodipine  Dispo: Admit patient to Inpatient with expected length of stay greater than 2 midnights.  Signed: Katherine Roan, MD 12/29/2019, 12:42 PM

## 2019-12-30 ENCOUNTER — Inpatient Hospital Stay (HOSPITAL_COMMUNITY): Payer: Medicare Other

## 2019-12-30 DIAGNOSIS — N186 End stage renal disease: Secondary | ICD-10-CM

## 2019-12-30 DIAGNOSIS — I132 Hypertensive heart and chronic kidney disease with heart failure and with stage 5 chronic kidney disease, or end stage renal disease: Secondary | ICD-10-CM

## 2019-12-30 DIAGNOSIS — Z87891 Personal history of nicotine dependence: Secondary | ICD-10-CM

## 2019-12-30 DIAGNOSIS — Z992 Dependence on renal dialysis: Secondary | ICD-10-CM

## 2019-12-30 DIAGNOSIS — I120 Hypertensive chronic kidney disease with stage 5 chronic kidney disease or end stage renal disease: Secondary | ICD-10-CM

## 2019-12-30 DIAGNOSIS — Z79899 Other long term (current) drug therapy: Secondary | ICD-10-CM

## 2019-12-30 DIAGNOSIS — I5030 Unspecified diastolic (congestive) heart failure: Secondary | ICD-10-CM

## 2019-12-30 DIAGNOSIS — R7881 Bacteremia: Secondary | ICD-10-CM

## 2019-12-30 DIAGNOSIS — J9601 Acute respiratory failure with hypoxia: Secondary | ICD-10-CM

## 2019-12-30 DIAGNOSIS — E1122 Type 2 diabetes mellitus with diabetic chronic kidney disease: Secondary | ICD-10-CM

## 2019-12-30 DIAGNOSIS — B9561 Methicillin susceptible Staphylococcus aureus infection as the cause of diseases classified elsewhere: Secondary | ICD-10-CM

## 2019-12-30 DIAGNOSIS — R0600 Dyspnea, unspecified: Secondary | ICD-10-CM

## 2019-12-30 DIAGNOSIS — Z794 Long term (current) use of insulin: Secondary | ICD-10-CM

## 2019-12-30 LAB — BLOOD CULTURE ID PANEL (REFLEXED)

## 2019-12-30 LAB — RENAL FUNCTION PANEL
Albumin: 2.8 g/dL — ABNORMAL LOW (ref 3.5–5.0)
Anion gap: 15 (ref 5–15)
BUN: 26 mg/dL — ABNORMAL HIGH (ref 8–23)
CO2: 26 mmol/L (ref 22–32)
Calcium: 8.9 mg/dL (ref 8.9–10.3)
Chloride: 95 mmol/L — ABNORMAL LOW (ref 98–111)
Creatinine, Ser: 4.26 mg/dL — ABNORMAL HIGH (ref 0.44–1.00)
GFR calc Af Amer: 12 mL/min — ABNORMAL LOW (ref >60)
GFR calc non Af Amer: 10 mL/min — ABNORMAL LOW (ref >60)
Glucose, Bld: 153 mg/dL — ABNORMAL HIGH (ref 70–99)
Phosphorus: 6.5 mg/dL — ABNORMAL HIGH (ref 2.5–4.6)
Potassium: 4 mmol/L (ref 3.5–5.1)
Sodium: 136 mmol/L (ref 135–145)

## 2019-12-30 LAB — CBC
HCT: 29.5 % — ABNORMAL LOW (ref 36.0–46.0)
Hemoglobin: 9.2 g/dL — ABNORMAL LOW (ref 12.0–15.0)
MCH: 27.4 pg (ref 26.0–34.0)
MCHC: 31.2 g/dL (ref 30.0–36.0)
MCV: 87.8 fL (ref 80.0–100.0)
Platelets: 220 10*3/uL (ref 150–400)
RBC: 3.36 MIL/uL — ABNORMAL LOW (ref 3.87–5.11)
RDW: 14.3 % (ref 11.5–15.5)
WBC: 9.8 10*3/uL (ref 4.0–10.5)
nRBC: 0 % (ref 0.0–0.2)

## 2019-12-30 LAB — HEMOGLOBIN A1C
Hgb A1c MFr Bld: 6.5 % — ABNORMAL HIGH (ref 4.8–5.6)
Mean Plasma Glucose: 139.85 mg/dL

## 2019-12-30 LAB — ECHOCARDIOGRAM COMPLETE
Height: 64 in
Weight: 2672 oz

## 2019-12-30 LAB — MRSA PCR SCREENING: MRSA by PCR: NEGATIVE

## 2019-12-30 LAB — GLUCOSE, CAPILLARY
Glucose-Capillary: 134 mg/dL — ABNORMAL HIGH (ref 70–99)
Glucose-Capillary: 137 mg/dL — ABNORMAL HIGH (ref 70–99)
Glucose-Capillary: 172 mg/dL — ABNORMAL HIGH (ref 70–99)
Glucose-Capillary: 134 mg/dL — ABNORMAL HIGH (ref 70–99)

## 2019-12-30 MED ORDER — FERRIC CITRATE 1 GM 210 MG(FE) PO TABS
420.0000 mg | ORAL_TABLET | Freq: Three times a day (TID) | ORAL | Status: DC
  Administered 2019-12-30 – 2020-01-03 (×8): 420 mg via ORAL
  Filled 2019-12-30 (×8): qty 2

## 2019-12-30 MED ORDER — CEFAZOLIN SODIUM-DEXTROSE 1-4 GM/50ML-% IV SOLN
1.0000 g | INTRAVENOUS | Status: AC
  Administered 2019-12-31 – 2020-01-02 (×4): 1 g via INTRAVENOUS
  Filled 2019-12-30 (×5): qty 50

## 2019-12-30 MED ORDER — SENNOSIDES-DOCUSATE SODIUM 8.6-50 MG PO TABS
1.0000 | ORAL_TABLET | Freq: Two times a day (BID) | ORAL | Status: DC
  Administered 2019-12-30 – 2020-01-02 (×7): 1 via ORAL
  Filled 2019-12-30 (×7): qty 1

## 2019-12-30 MED ORDER — CALCITRIOL 0.25 MCG PO CAPS
0.2500 ug | ORAL_CAPSULE | ORAL | Status: DC
  Administered 2019-12-31: 0.25 ug via ORAL

## 2019-12-30 MED ORDER — RIFAMPIN 300 MG PO CAPS
300.0000 mg | ORAL_CAPSULE | Freq: Two times a day (BID) | ORAL | Status: DC
  Administered 2019-12-30 – 2020-01-01 (×5): 300 mg via ORAL
  Filled 2019-12-30 (×8): qty 1

## 2019-12-30 MED ORDER — PSYLLIUM 95 % PO PACK
1.0000 | PACK | Freq: Every day | ORAL | Status: DC
  Administered 2019-12-30 – 2020-01-01 (×3): 1 via ORAL
  Filled 2019-12-30 (×3): qty 1

## 2019-12-30 MED ORDER — CHLORHEXIDINE GLUCONATE CLOTH 2 % EX PADS
6.0000 | MEDICATED_PAD | Freq: Every day | CUTANEOUS | Status: DC
  Administered 2019-12-30 – 2020-01-03 (×5): 6 via TOPICAL

## 2019-12-30 NOTE — Progress Notes (Signed)
  Echocardiogram 2D Echocardiogram has been performed.  [name] 12/30/2019, 11:43 AM

## 2019-12-30 NOTE — Consult Note (Addendum)
[facility] for Infectious Disease    Date of Admission:  12/29/2019      Total days of antibiotics 2  Day 1 cefazolin               Reason for Consult: MSSA bacteremia     Referring Provider: Auto consult  Primary Care Provider: Sonia Side., FNP     Assessment: Melissa Howe is a 69 y.o. female here with dyspnea/orthopnea and fevers. Found to have MSSA bacteremia. She has been appropriately narrowed to Cefazolin and looks to be rapidly improved overnight. Afebrile and off bipap/all supplemental O2. Appears more c/w fluid overload.   She has methicillin sensitive staph aureus bacteremia - on exam she has no murmur, no implanted cardiac devices or valves. No artificial joints or other hardware and no signs/symptoms of other metastatic sites of infection. Source presently unclear. ?transient bacteremia from AVG access however need to rule out deeper infection.  She has growth from both anaerobic bottles (only these were collected). TTE pending.   Will repeat blood cultures. Would consider having vascular evaluate her graft given her concern for pain with accessing and bacteremia. Will add rifampin for now.      Plan: 1. TTE pending 2. Repeat blood cultures 3. Continue Cefazolin  4. Ultrasound of left forearm for fluid collections 5. Consider vascular evaluation of graft  ADDENDUM - discussed graft evaluation with nephrology NP and recommended to proceed with VVS evaluation so they can consider imaging modality.     Principal Problem:   MSSA bacteremia Active Problems:   Essential hypertension   Uncontrolled type 2 diabetes mellitus with hyperglycemia, with long-term current use of insulin (HCC)   CAP (community acquired pneumonia)   Chronic diastolic CHF (congestive heart failure) ([place])   Acute respiratory failure with hypoxemia ([place])   . [START ON 12/31/2019] calcitRIOL  0.25 mcg Oral Q T,Th,Sa-HD  . carvedilol  6.25 mg Oral BID WC  .  Chlorhexidine Gluconate Cloth  6 each Topical Q0600  . Chlorhexidine Gluconate Cloth  6 each Topical Q0600  . ferric citrate  420 mg Oral TID WC  . heparin  5,000 Units Subcutaneous Q8H  . insulin aspart  0-15 Units Subcutaneous TID WC  . insulin aspart  0-5 Units Subcutaneous QHS  . insulin aspart  3 Units Subcutaneous TID WC  . insulin detemir  8 Units Subcutaneous QHS  . psyllium  1 packet Oral Daily  . rifampin  300 mg Oral BID WC  . senna-docusate  1 tablet Oral BID    HPI: Melissa Howe is a 69 y.o. female admitted from home with complaint of dyspnea.   PMHx ESRD on HD T/Th/Sat (new since 12/15/19), T2DM, HTN. She had AVG placed in L FA 09-2019 by Dr. Donnetta Hutching.   She reports she was in her usual state of health up until Thursday after her HD treatment. She expereinced a sudden very difficult time catching her breath. No coughs/chest pain or fevers to her knowledge prior to admission. She had HD earlier that day and completed a full treatment with goal UF.  She did notice some pain in the AVG twice recently with her treatments. Denies any erythema, fluctuance or other signs of infection. She otherwise denies any n/v diarrhea. Has some lower extremity edema periodically that resolves with HD sessions. Denies any back or musculoskeletal pains. No other implantable devices / hardware outside gortex graft for HD.   In the  ER she was found to be fluid overloaded requiring BiPAP. Denied N/V, diarrhea, fevers/chills. COVID negative x 2. She had a very hard time breathing comfortably anywhere but a recliner.   She feels much better overall and has been off Bipap as well as all other supplemental oxygen since yesterday   TTE in process. Blood cultures returned (+) MSSA 1/2 and antibiotics have been changed from Ceftriaxone/Azithromycin to Cefazolin alone.    Review of Systems: Review of Systems  Constitutional: Positive for fever. Negative for chills.  HENT: Negative for tinnitus.   Eyes:  Negative for blurred vision and photophobia.  Respiratory: Positive for shortness of breath. Negative for cough and sputum production.   Cardiovascular: Positive for orthopnea. Negative for chest pain.  Gastrointestinal: Negative for diarrhea, nausea and vomiting.  Genitourinary: Negative for dysuria.  Musculoskeletal: Negative for back pain, joint pain and neck pain.  Skin: Negative for rash.  Neurological: Negative for dizziness, focal weakness, weakness and headaches.    Past Medical History:  Diagnosis Date  . Chronic kidney disease   . Complication of anesthesia   . Constipation   . Diabetes mellitus    Type II  . Dyspnea    with exertion  . Hyperlipemia   . Hypertension   . Pneumonia 2019  . PONV (postoperative nausea and vomiting)    x 1     Social History   Tobacco Use  . Smoking status: Former Smoker    Years: 4.00    Types: Cigarettes    Quit date: 01/19/1984    Years since quitting: 35.9  . Smokeless tobacco: Never Used  Substance Use Topics  . Alcohol use: No  . Drug use: No    Family History  Problem Relation Age of Onset  . Hypertension Mother   . Diabetes Mother   . Hypertension Sister    No Known Allergies  OBJECTIVE: Blood pressure (!) 143/75, pulse 87, temperature 98 F (36.7 C), temperature source Oral, resp. rate 19, height 5\' 4"  (1.626 m), weight 75.8 kg, SpO2 98 %.  Physical Exam Vitals reviewed.  Constitutional:      Appearance: She is well-developed.     Comments: Seated comfortably in chair eating lunch.   HENT:     Mouth/Throat:     Mouth: No oral lesions.     Dentition: Normal dentition. No dental abscesses.     Pharynx: No oropharyngeal exudate.  Cardiovascular:     Rate and Rhythm: Normal rate and regular rhythm.     Heart sounds: Normal heart sounds. No murmur.  Pulmonary:     Effort: Pulmonary effort is normal.     Breath sounds: Normal breath sounds.  Abdominal:     General: There is no distension.     Palpations:  Abdomen is soft.     Tenderness: There is no abdominal tenderness.  Lymphadenopathy:     Cervical: No cervical adenopathy.  Skin:    General: Skin is warm and dry.     Findings: No rash.     Comments: L FA AVG +bruit/thrill. No overt warmth noted. No fluctuance or erythema.   Neurological:     Mental Status: She is alert and oriented to person, place, and time.  Psychiatric:        Judgment: Judgment normal.     Comments: In good spirits today and engaged in care discussion     Lab Results Lab Results  Component Value Date   WBC 9.8 12/30/2019   HGB 9.2 (L)  12/30/2019   HCT 29.5 (L) 12/30/2019   MCV 87.8 12/30/2019   PLT 220 12/30/2019    Lab Results  Component Value Date   CREATININE 4.26 (H) 12/30/2019   BUN 26 (H) 12/30/2019   NA 136 12/30/2019   K 4.0 12/30/2019   CL 95 (L) 12/30/2019   CO2 26 12/30/2019    Lab Results  Component Value Date   ALT 30 12/29/2019   AST 40 12/29/2019   ALKPHOS 90 12/29/2019   BILITOT 1.2 12/29/2019     Microbiology: Recent Results (from the past 240 hour(s))  Culture, blood (routine x 2)     Status: Abnormal (Preliminary result)   Collection Time: 12/29/19  8:18 AM   Specimen: BLOOD RIGHT HAND  Result Value Ref Range Status   Specimen Description BLOOD RIGHT HAND  Final   Special Requests   Final    BOTTLES DRAWN AEROBIC ONLY Blood Culture results may not be optimal due to an inadequate volume of blood received in culture bottles   Culture  Setup Time   Final    AEROBIC BOTTLE ONLY GRAM POSITIVE COCCI IN CLUSTERS CRITICAL RESULT CALLED TO, READ BACK BY AND VERIFIED WITH: T DANG PHARMD 12/29/19 2241 JDW    Culture (A)  Final    STAPHYLOCOCCUS AUREUS SUSCEPTIBILITIES TO FOLLOW Performed at [facility], [address] [place], [place] [address]    Report Status PENDING  Incomplete  Culture, blood (routine x 2)     Status: Abnormal (Preliminary result)   Collection Time: 12/29/19  8:25 AM   Specimen: BLOOD RIGHT HAND    Result Value Ref Range Status   Specimen Description BLOOD RIGHT HAND  Final   Special Requests   Final    BOTTLES DRAWN AEROBIC ONLY Blood Culture results may not be optimal due to an inadequate volume of blood received in culture bottles   Culture  Setup Time   Final    AEROBIC BOTTLE ONLY GRAM POSITIVE COCCI IN CLUSTERS CRITICAL RESULT CALLED TO, READ BACK BY AND VERIFIED WITH: V BRYK PHARMD 12/30/19 0102 JDW    Culture (A)  Final    STAPHYLOCOCCUS AUREUS CULTURE REINCUBATED FOR BETTER GROWTH Performed at [facility], [place] [address], [place], [place] [address]    Report Status PENDING  Incomplete  Blood Culture ID Panel (Reflexed)     Status: Abnormal   Collection Time: 12/29/19  8:25 AM  Result Value Ref Range Status   Enterococcus species NOT DETECTED NOT DETECTED Final   Listeria monocytogenes NOT DETECTED NOT DETECTED Final   Staphylococcus species DETECTED (A) NOT DETECTED Final    Comment: CRITICAL RESULT CALLED TO, READ BACK BY AND VERIFIED WITH: V BRYK PHARMD 12/30/19 0102 JDW    Staphylococcus aureus (BCID) DETECTED (A) NOT DETECTED Final    Comment: Methicillin (oxacillin) susceptible Staphylococcus aureus (MSSA). Preferred therapy is anti staphylococcal beta lactam antibiotic (Cefazolin or Nafcillin), unless clinically contraindicated. CRITICAL RESULT CALLED TO, READ BACK BY AND VERIFIED WITH: V BRYK PHARMD 12/30/19 0102 JDW    Methicillin resistance NOT DETECTED NOT DETECTED Final   Streptococcus species NOT DETECTED NOT DETECTED Final   Streptococcus agalactiae NOT DETECTED NOT DETECTED Final   Streptococcus pneumoniae NOT DETECTED NOT DETECTED Final   Streptococcus pyogenes NOT DETECTED NOT DETECTED Final   Acinetobacter baumannii NOT DETECTED NOT DETECTED Final   Enterobacteriaceae species NOT DETECTED NOT DETECTED Final   Enterobacter cloacae complex NOT DETECTED NOT DETECTED Final   Escherichia coli  NOT DETECTED NOT DETECTED Final   Klebsiella oxytoca NOT  DETECTED NOT DETECTED Final   Klebsiella pneumoniae NOT DETECTED NOT DETECTED Final   Proteus species NOT DETECTED NOT DETECTED Final   Serratia marcescens NOT DETECTED NOT DETECTED Final   Haemophilus influenzae NOT DETECTED NOT DETECTED Final   Neisseria meningitidis NOT DETECTED NOT DETECTED Final   Pseudomonas aeruginosa NOT DETECTED NOT DETECTED Final   Candida albicans NOT DETECTED NOT DETECTED Final   Candida glabrata NOT DETECTED NOT DETECTED Final   Candida krusei NOT DETECTED NOT DETECTED Final   Candida parapsilosis NOT DETECTED NOT DETECTED Final   Candida tropicalis NOT DETECTED NOT DETECTED Final    Comment: Performed at [facility], [place] [address], [place], [place] [address]  Respiratory Panel by RT PCR (Flu A&B, Covid) - Nasopharyngeal Swab     Status: None   Collection Time: 12/29/19  9:23 AM   Specimen: Nasopharyngeal Swab  Result Value Ref Range Status   SARS Coronavirus 2 by RT PCR NEGATIVE NEGATIVE Final    Comment: (NOTE) SARS-CoV-2 target nucleic acids are NOT DETECTED. The SARS-CoV-2 RNA is generally detectable in upper respiratoy specimens during the acute phase of infection. The lowest concentration of SARS-CoV-2 viral copies this assay can detect is 131 copies/mL. A negative result does not preclude SARS-Cov-2 infection and should not be used as the sole basis for treatment or other patient management decisions. A negative result may occur with  improper specimen collection/handling, submission of specimen other than nasopharyngeal swab, presence of viral mutation(s) within the areas targeted by this assay, and inadequate number of viral copies (<131 copies/mL). A negative result must be combined with clinical observations, patient history, and epidemiological information. The expected result is Negative. Fact Sheet for Patients:  PinkCheek.be Fact Sheet for Healthcare Providers:    GravelBags.it This test is not yet ap proved or cleared by the [place] FDA and  has been authorized for detection and/or diagnosis of SARS-CoV-2 by FDA under an Emergency Use Authorization (EUA). This EUA will remain  in effect (meaning this test can be used) for the duration of the COVID-19 declaration under Section 564(b)(1) of the Act, 21 U.S.C. section 360bbb-3(b)(1), unless the authorization is terminated or revoked sooner.    Influenza A by PCR NEGATIVE NEGATIVE Final   Influenza B by PCR NEGATIVE NEGATIVE Final    Comment: (NOTE) The Xpert Xpress SARS-CoV-2/FLU/RSV assay is intended as an aid in  the diagnosis of influenza from Nasopharyngeal swab specimens and  should not be used as a sole basis for treatment. Nasal washings and  aspirates are unacceptable for Xpert Xpress SARS-CoV-2/FLU/RSV  testing. Fact Sheet for Patients: PinkCheek.be Fact Sheet for Healthcare Providers: GravelBags.it This test is not yet approved or cleared by the [place] FDA and  has been authorized for detection and/or diagnosis of SARS-CoV-2 by  FDA under an Emergency Use Authorization (EUA). This EUA will remain  in effect (meaning this test can be used) for the duration of the  Covid-19 declaration under Section 564(b)(1) of the Act, 21  U.S.C. section 360bbb-3(b)(1), unless the authorization is  terminated or revoked. Performed at [facility], [place] [address], [place], [place] [address]   MRSA PCR Screening     Status: None   Collection Time: 12/29/19 11:42 PM   Specimen: Nasopharyngeal  Result Value Ref Range Status   MRSA by PCR NEGATIVE NEGATIVE Final    Comment:        The  GeneXpert MRSA Assay (FDA approved for NASAL specimens only), is one component of a comprehensive MRSA colonization surveillance program. It is not intended to diagnose MRSA infection nor to guide or monitor  treatment for MRSA infections. Performed at [facility], [place] [address], [place], [place] [address]      Janene Madeira, MSN, NP-C [facility] Infectious Disease [email or web address] Pager: [phone] Office: [phone] [place]: [phone]

## 2019-12-30 NOTE — Progress Notes (Signed)
Paula from lab notified this nurse that only 3 blood cultures can be drawn in a 24 hour period. Melissa Howe stated that if 4 are required the pathologist on call has to be notified. This RN called Dr Madilyn Fireman with IM. Will hold blood cultures for now.

## 2019-12-30 NOTE — Progress Notes (Signed)
   Subjective: Pt seen at the bedside this morning. States she is feeling improve since her presentation yesterday. Pt's dialysis access is through LUE AVG, no lines in place. Denies shortness of breath, nausea/vomiting, abdominal pain, or open wounds on the skin.  Objective:  Vital signs in last 24 hours: Vitals:   12/29/19 2316 12/29/19 2341 12/29/19 2356 12/30/19 0615  BP: (!) 149/58     Pulse: (!) 102  89   Resp: (!) 24  19   Temp: (!) 103 F (39.4 C) (!) 100.9 F (38.3 C)  98 F (36.7 C)  TempSrc: Oral Oral  Oral  SpO2: 95%  98%   Weight:      Height:       Physical Exam Vitals and nursing note reviewed.  Constitutional:      General: She is not in acute distress.    Appearance: She is ill-appearing (chronically).  Cardiovascular:     Rate and Rhythm: Normal rate and regular rhythm.     Heart sounds: Normal heart sounds.  Pulmonary:     Effort: Pulmonary effort is normal.     Comments: On room air. Good air movement. Bilateral crackles. Musculoskeletal:     Right lower leg: No edema.     Left lower leg: No edema.     Comments: AVG over LUE. Normal appearance, non-tender, palpable thrill.   Neurological:     Mental Status: She is alert.    Assessment/Plan:  Principal Problem:   CAP (community acquired pneumonia) Active Problems:   Acute respiratory failure with hypoxemia ([place])  Ms. Marceaux is a 69 year old F with significant PMH of ESRD on TTS dialysis, type II diabetes, and HFpEF, who presented due to worsening shortness of breath and was found to be febrile to 103, hypoxic requiring BiPAP, tachypnic, and tachycardic all concerning for sepsis. Found to   MSSA bacteremia Acute hypoxic respiratory failure Pt presented with fever, hypoxia, and shortness of breath concerning for volume overload superimposed on sepsis pneumonia. Initially required HFNC and BiPAP. Received one dose ceftriaxone/azithromycin the morning of 3/4 and one dose vancomycin that evening. Pt now  saturating well on room air.  - leukocytosis resolved 9.8 << 17.4 - T max of 103 overnight - 2 of 2 blood cultures from 12/29/2019 growing MSSA - antibiotics switched to cefazolin  - repeat blood cultures collected at the time of vancomycin dosing yesterday - ID consulted, appreciate their recommendations - getting echo  ESRD on TTS HD - pt two weeks into HD - [facility] dialyzes through [facility] consulted, appreciate recommendations. - tolerated dialysis well yesterday, 1.7L out - next HD on Saturday - no iron given bacteremia  T2DM - well controlled Pt on 18units Levemir at home. A1c on admission 6.5. - levemir 8 units + novolog 3 units TID with meals  - moderate + qhs SSI  HTN - continue home carvedilol 6.25mg  BID - holding home amlodipine 10mg  daily  Prior to Admission Living Arrangement: home Anticipated Discharge Location: home with ?home health Barriers to Discharge: clinical improvement, antibiotic course length Dispo: Anticipated discharge in approximately 2-3 day(s).   Ladona Horns, MD 12/30/2019, 6:25 AM Pager: [phone]

## 2019-12-30 NOTE — Progress Notes (Signed)
[facility] NEPHROLOGY PROGRESS NOTE  Assessment/ Plan: Pt is a 69 y.o. yo female with DM, HTN, ESRD on HD admitted with shortness of breath, hypoxia and pneumonia.  Dialysis Orders: Center: [facility]  on TTS. Time: 4 hours, BFR 400/DFR 800, 2K/2Ca, EDW 69kg, UF profile: none, LUE AVG. Heparin 2200 unit bolus Mircera 75 mcg IV q 2 weeks- last dose 122mcg on 3/2 Venfoer 100 mg IV q HD x 10 doses- received 5 so far Calcitriol 0.25 mcg PO TID with HD  #Acute hypoxic respiratory failure/CAP: Received ceftriaxone and azithromycin.  Blood culture growing MSSA therefore antibiotics changed to cefazolin.  Per primary team.  # ESRD TTS: Status post dialysis yesterday and tolerated well.  Plan for next HD tomorrow.  AV graft for the access.  # Anemia of ESRD: Received Mircera on 3/2.  No iron because of bacteremia.  Monitor hemoglobin.  # Secondary hyperparathyroidism: Phosphorus level mildly elevated.  Start [place].  Resume calcitriol.  # HTN/volume: Continue to monitor.  BP acceptable.  Subjective: Seen and examined at bedside.  Reported breathing is somewhat better.  Denies chest pain.  No nausea vomiting.  Had dialysis yesterday. Objective Vital signs in last 24 hours: Vitals:   12/29/19 2341 12/29/19 2356 12/30/19 0615 12/30/19 0858  BP:    (!) 143/75  Pulse:  89  87  Resp:  19    Temp: (!) 100.9 F (38.3 C)  98 F (36.7 C) 98 F (36.7 C)  TempSrc: Oral  Oral Oral  SpO2:  98%  98%  Weight:      Height:       Weight change:   Intake/Output Summary (Last 24 hours) at 12/30/2019 1129 Last data filed at 12/30/2019 0900 Gross per 24 hour  Intake 565.35 ml  Output 1730 ml  Net -1164.65 ml       Labs: Basic Metabolic Panel: Recent Labs  Lab 12/29/19 0847 12/29/19 0856 12/30/19 0806  NA 135 133* 136  K 4.4 4.2 4.0  CL 95* 99 95*  CO2 24  --  26  GLUCOSE 334* 338* 153*  BUN 38* 39* 26*  CREATININE 4.87* 5.10* 4.26*  CALCIUM 9.0  --  8.9   PHOS  --   --  6.5*   Liver Function Tests: Recent Labs  Lab 12/29/19 0847 12/30/19 0806  AST 40  --   ALT 30  --   ALKPHOS 90  --   BILITOT 1.2  --   PROT 7.2  --   ALBUMIN 3.4* 2.8*   No results for input(s): LIPASE, AMYLASE in the last 168 hours. No results for input(s): AMMONIA in the last 168 hours. CBC: Recent Labs  Lab 12/29/19 0847 12/29/19 0856 12/30/19 0215  WBC 17.4*  --  9.8  HGB 10.8* 11.6* 9.2*  HCT 34.9* 34.0* 29.5*  MCV 89.5  --  87.8  PLT 265  --  220   Cardiac Enzymes: No results for input(s): CKTOTAL, CKMB, CKMBINDEX, TROPONINI in the last 168 hours. CBG: Recent Labs  Lab 12/29/19 1859 12/29/19 2110 12/30/19 0859  GLUCAP 129* 183* 134*    Iron Studies: No results for input(s): IRON, TIBC, TRANSFERRIN, FERRITIN in the last 72 hours. Studies/Results: DG Chest Portable 1 View  Result Date: 12/29/2019 CLINICAL DATA:  Shortness of breath respiratory distress patient on CPAP EXAM: PORTABLE CHEST 1 VIEW COMPARISON:  08/13/2019 FINDINGS: Cardiomediastinal contours are stable. Hilar structures with question of vascular engorgement. Subtle increased interstitial markings. Subtle opacity at  the right base. No dense consolidation. No signs of pleural effusion. Visualized skeletal structures are unremarkable. IMPRESSION: Subtle increased interstitial markings some of which could be technique related or mild edema/volume overload. Hazy at the right lung base may represent developing infection. Electronically Signed   By: Zetta Bills M.D.   On: 12/29/2019 08:39    Medications: Infusions: . [START ON 12/31/2019]  ceFAZolin (ANCEF) IV      Scheduled Medications: . carvedilol  6.25 mg Oral BID WC  . Chlorhexidine Gluconate Cloth  6 each Topical Q0600  . heparin  5,000 Units Subcutaneous Q8H  . insulin aspart  0-15 Units Subcutaneous TID WC  . insulin aspart  0-5 Units Subcutaneous QHS  . insulin aspart  3 Units Subcutaneous TID WC  . insulin detemir  8  Units Subcutaneous QHS  . psyllium  1 packet Oral Daily  . senna-docusate  1 tablet Oral BID    have reviewed scheduled and prn medications.  Physical Exam: General:NAD, comfortable Heart:RRR, s1s2 nl Lungs: Bibasal crackles, no increased work of breathing Abdomen:soft, Non-tender, non-distended Extremities:No edema Dialysis Access: Left upper extremity AV graft has thrill and bruit.  No tenderness, no sign of infection.  Melissa Howe 12/30/2019,11:29 AM  LOS: 1 day  Pager: [phone]

## 2019-12-30 NOTE — Progress Notes (Signed)
PHARMACY - PHYSICIAN COMMUNICATION CRITICAL VALUE ALERT - BLOOD CULTURE IDENTIFICATION (BCID)  Melissa Howe is an 69 y.o. female who presented to [facility] on 12/29/2019 with a chief complaint of worsening SOB.  Assessment:  Pt admitted for suspected CAP, started on broad-spectrum ABX, now blood cx growing MSSA in 2/2 bottles.  Name of physician (or Provider) Contacted: CLanier,MD  Current antibiotics: vancomycin, ceftriaxone, azithromycin  Changes to prescribed antibiotics recommended:  Recommendations accepted by provider -- change ABX to Ancef 1g IV Q24H (start 3/6 as pt is getting vanc now); recommend Ancef 2g IV after each HD if pt to be discharged on ABX.  Results for orders placed or performed during the hospital encounter of 12/29/19  Blood Culture ID Panel (Reflexed) (Collected: 12/29/2019  8:25 AM)  Result Value Ref Range   Enterococcus species NOT DETECTED NOT DETECTED   Listeria monocytogenes NOT DETECTED NOT DETECTED   Staphylococcus species DETECTED (A) NOT DETECTED   Staphylococcus aureus (BCID) DETECTED (A) NOT DETECTED   Methicillin resistance NOT DETECTED NOT DETECTED   Streptococcus species NOT DETECTED NOT DETECTED   Streptococcus agalactiae NOT DETECTED NOT DETECTED   Streptococcus pneumoniae NOT DETECTED NOT DETECTED   Streptococcus pyogenes NOT DETECTED NOT DETECTED   Acinetobacter baumannii NOT DETECTED NOT DETECTED   Enterobacteriaceae species NOT DETECTED NOT DETECTED   Enterobacter cloacae complex NOT DETECTED NOT DETECTED   Escherichia coli NOT DETECTED NOT DETECTED   Klebsiella oxytoca NOT DETECTED NOT DETECTED   Klebsiella pneumoniae NOT DETECTED NOT DETECTED   Proteus species NOT DETECTED NOT DETECTED   Serratia marcescens NOT DETECTED NOT DETECTED   Haemophilus influenzae NOT DETECTED NOT DETECTED   Neisseria meningitidis NOT DETECTED NOT DETECTED   Pseudomonas aeruginosa NOT DETECTED NOT DETECTED   Candida albicans NOT DETECTED NOT DETECTED   Candida glabrata NOT DETECTED NOT DETECTED   Candida krusei NOT DETECTED NOT DETECTED   Candida parapsilosis NOT DETECTED NOT DETECTED   Candida tropicalis NOT DETECTED NOT DETECTED    Wynona Neat, PharmD, BCPS  12/30/2019  1:16 AM

## 2019-12-31 DIAGNOSIS — R7881 Bacteremia: Secondary | ICD-10-CM

## 2019-12-31 DIAGNOSIS — Z992 Dependence on renal dialysis: Secondary | ICD-10-CM

## 2019-12-31 DIAGNOSIS — N186 End stage renal disease: Secondary | ICD-10-CM

## 2019-12-31 LAB — CULTURE, BLOOD (ROUTINE X 2)

## 2019-12-31 LAB — CBC
HCT: 29.5 % — ABNORMAL LOW (ref 36.0–46.0)
Hemoglobin: 9.2 g/dL — ABNORMAL LOW (ref 12.0–15.0)
MCH: 27.5 pg (ref 26.0–34.0)
MCHC: 31.2 g/dL (ref 30.0–36.0)
MCV: 88.3 fL (ref 80.0–100.0)
Platelets: 235 10*3/uL (ref 150–400)
RBC: 3.34 MIL/uL — ABNORMAL LOW (ref 3.87–5.11)
RDW: 14.2 % (ref 11.5–15.5)
WBC: 11.5 10*3/uL — ABNORMAL HIGH (ref 4.0–10.5)
nRBC: 0 % (ref 0.0–0.2)

## 2019-12-31 LAB — GLUCOSE, CAPILLARY
Glucose-Capillary: 102 mg/dL — ABNORMAL HIGH (ref 70–99)
Glucose-Capillary: 136 mg/dL — ABNORMAL HIGH (ref 70–99)
Glucose-Capillary: 171 mg/dL — ABNORMAL HIGH (ref 70–99)
Glucose-Capillary: 139 mg/dL — ABNORMAL HIGH (ref 70–99)

## 2019-12-31 LAB — RENAL FUNCTION PANEL
Albumin: 2.4 g/dL — ABNORMAL LOW (ref 3.5–5.0)
Anion gap: 15 (ref 5–15)
BUN: 45 mg/dL — ABNORMAL HIGH (ref 8–23)
CO2: 23 mmol/L (ref 22–32)
Calcium: 8.4 mg/dL — ABNORMAL LOW (ref 8.9–10.3)
Chloride: 97 mmol/L — ABNORMAL LOW (ref 98–111)
Creatinine, Ser: 6.06 mg/dL — ABNORMAL HIGH (ref 0.44–1.00)
GFR calc Af Amer: 8 mL/min — ABNORMAL LOW (ref >60)
GFR calc non Af Amer: 7 mL/min — ABNORMAL LOW (ref >60)
Glucose, Bld: 89 mg/dL (ref 70–99)
Phosphorus: 3.7 mg/dL (ref 2.5–4.6)
Potassium: 3.6 mmol/L (ref 3.5–5.1)
Sodium: 135 mmol/L (ref 135–145)

## 2019-12-31 MED ORDER — PENTAFLUOROPROP-TETRAFLUOROETH EX AERO: 1.0000 "application " | INHALATION_SPRAY | CUTANEOUS | Status: DC | PRN

## 2019-12-31 MED ORDER — ALTEPLASE 2 MG IJ SOLR: 2.0000 mg | Freq: Once | INTRAMUSCULAR | Status: DC | PRN

## 2019-12-31 MED ORDER — LIDOCAINE-PRILOCAINE 2.5-2.5 % EX CREA: 1.0000 "application " | TOPICAL_CREAM | CUTANEOUS | Status: DC | PRN

## 2019-12-31 MED ORDER — CALCITRIOL 0.25 MCG PO CAPS
ORAL_CAPSULE | ORAL | Status: AC
  Filled 2019-12-31: qty 1

## 2019-12-31 MED ORDER — SODIUM CHLORIDE 0.9 % IV SOLN: 100.0000 mL | INTRAVENOUS | Status: DC | PRN

## 2019-12-31 MED ORDER — LIDOCAINE HCL (PF) 1 % IJ SOLN: 5.0000 mL | INTRAMUSCULAR | Status: DC | PRN

## 2019-12-31 MED ORDER — HEPARIN SODIUM (PORCINE) 1000 UNIT/ML DIALYSIS: 1000.0000 [IU] | INTRAMUSCULAR | Status: DC | PRN

## 2019-12-31 MED ORDER — HEPARIN SODIUM (PORCINE) 1000 UNIT/ML DIALYSIS: 2200.0000 [IU] | Freq: Once | INTRAMUSCULAR | Status: DC

## 2019-12-31 NOTE — H&P (View-Only) (Signed)
ID PROGRESS NOTE  69yo F with ESRD on HD TTS with AVG admitted for fevers, fatigue, SOB found to have MSSA bacteremia.TTE does not show evidence of endocarditis. AVG on exam is non revealing  S: afebrile  O: BP 114/80 (BP Location: Right Arm)   Pulse 75   Temp 97.9 F (36.6 C) (Oral)   Resp (!) 22   Ht 5\' 4"  (1.626 m)   Wt 64.7 kg   SpO2 97%   BMI 24.48 kg/m  Physical Exam  Constitutional:  oriented to person, place, and time. appears well-developed and well-nourished. No distress.  HENT: [place]/AT, PERRLA, no scleral icterus Mouth/Throat: Oropharynx is clear and moist. No oropharyngeal exudate.  Cardiovascular: Normal rate, regular rhythm and normal heart sounds. Exam reveals no gallop and no friction rub.  No murmur heard.  Pulmonary/Chest: Effort normal and breath sounds normal. No respiratory distress.  has no wheezes.  Neck = supple, no nuchal rigidity Abdominal: Soft. Bowel sounds are normal.  exhibits no distension. There is no tenderness.  Ext:AVG in left arm nontender Skin: Skin is warm and dry. No rash noted. No erythema.  Psychiatric: a normal mood and affect.  behavior is normal.   Micro: 3/4 blood cx MSSA 3/5 blood cx pending  A/p:MSSA bacteremia of unknown source - recommend to get TEE to have more sensitive test for evaluating valves - continue on cefazolin with HD - await repeat blood cx to ensure she is clearing bacteremia - recommend imaging of spine T-L to see if any signs of discitis  Hadasa Gasner B. [facility] [phone]

## 2019-12-31 NOTE — Progress Notes (Signed)
ID PROGRESS NOTE  69yo F with ESRD on HD TTS with AVG admitted for fevers, fatigue, SOB found to have MSSA bacteremia.TTE does not show evidence of endocarditis. AVG on exam is non revealing  S: afebrile  O: BP 114/80 (BP Location: Right Arm)   Pulse 75   Temp 97.9 F (36.6 C) (Oral)   Resp (!) 22   Ht 5\' 4"  (1.626 m)   Wt 64.7 kg   SpO2 97%   BMI 24.48 kg/m  Physical Exam  Constitutional:  oriented to person, place, and time. appears well-developed and well-nourished. No distress.  HENT: [place]/AT, PERRLA, no scleral icterus Mouth/Throat: Oropharynx is clear and moist. No oropharyngeal exudate.  Cardiovascular: Normal rate, regular rhythm and normal heart sounds. Exam reveals no gallop and no friction rub.  No murmur heard.  Pulmonary/Chest: Effort normal and breath sounds normal. No respiratory distress.  has no wheezes.  Neck = supple, no nuchal rigidity Abdominal: Soft. Bowel sounds are normal.  exhibits no distension. There is no tenderness.  Ext:AVG in left arm nontender Skin: Skin is warm and dry. No rash noted. No erythema.  Psychiatric: a normal mood and affect.  behavior is normal.   Micro: 3/4 blood cx MSSA 3/5 blood cx pending  A/p:MSSA bacteremia of unknown source - recommend to get TEE to have more sensitive test for evaluating valves - continue on cefazolin with HD - await repeat blood cx to ensure she is clearing bacteremia - recommend imaging of spine T-L to see if any signs of discitis  Sabrin Dunlevy B. [facility] [phone]

## 2019-12-31 NOTE — Progress Notes (Signed)
Internal Medicine Attending Note:  I have seen and evaluated this patient and I have discussed the plan of care with the house staff. Please see their note for complete details. I concur with their findings.  Velna Ochs, MD 12/31/2019, 11:14 AM

## 2019-12-31 NOTE — Progress Notes (Addendum)
Hospital Consult    Reason for Consult: Evaluation of left forearm AV graft Requesting Physician: Dr. Ronnald Ramp MRN #:  [id number]  History of Present Illness: This is a 69 y.o. female with past medical history significant for end-stage renal disease on hemodialysis admitted to the hospital with bacteremia of unknown source.  She is being seen in consultation for evaluation of left forearm loop graft.  This was placed in December of last year by Dr. Donnetta Hutching.  She occasionally has some pain with cannulation of graft however none this morning.  She denies any known fluctuance, erythema or severe pain of left arm graft.  She is seen during hemodialysis.  Past Medical History:  Diagnosis Date  . Chronic kidney disease   . Complication of anesthesia   . Constipation   . Diabetes mellitus    Type II  . Dyspnea    with exertion  . Hyperlipemia   . Hypertension   . Pneumonia 2019  . PONV (postoperative nausea and vomiting)    x 1     Past Surgical History:  Procedure Laterality Date  . APPENDECTOMY    . AV FISTULA PLACEMENT Left 10/05/2019   Procedure: INSERTION OF ARTERIOVENOUS (AV) GORE-TEX VASCULAR GRAFT LEFT ARM;  Surgeon: [name], MD;  Location: [facility];  Service: Vascular;  Laterality: Left;  . EYE SURGERY Bilateral    Cataract  . FOREIGN BODY REMOVAL Left 05/13/2018   Procedure: FOREIGN BODY REMOVAL left foot;  Surgeon: Meredith Pel, MD;  Location: [facility];  Service: Orthopedics;  Laterality: Left;  . I & D EXTREMITY Left 05/21/2018   Procedure: LEFT FOOT DEBRIDEMENT AND WOUND CLOSURE;  Surgeon: Newt Minion, MD;  Location: [facility];  Service: Orthopedics;  Laterality: Left;  . TONSILLECTOMY    . TUBAL LIGATION      No Known Allergies  Prior to Admission medications   Medication Sig Start Date End Date Taking? Authorizing Provider  acetaminophen (TYLENOL) 500 MG tablet Take 500 mg by mouth every 6 (six) hours as needed for moderate pain or headache.   Yes [provider]  amLODipine (NORVASC) 10 MG tablet Take 1 tablet (10 mg total) by mouth daily. 08/11/19 12/29/19 Yes Dahal, Marlowe Aschoff, MD  aspirin EC 81 MG tablet Take 1 tablet (81 mg total) by mouth daily. 09/08/16  Yes Tat, Shanon Brow, MD  carvedilol (COREG) 12.5 MG tablet Take 0.5 tablets (6.25 mg total) by mouth 2 (two) times daily with a meal. 08/11/19 12/29/19 Yes Dahal, Marlowe Aschoff, MD  ferrous sulfate 325 (65 FE) MG tablet Take 325 mg by mouth daily.   Yes [provider]  Insulin Detemir (LEVEMIR) 100 UNIT/ML Pen Inject 18 Units into the skin daily with breakfast. 08/11/19  Yes Dahal, Marlowe Aschoff, MD  multivitamin (RENA-VIT) TABS tablet Take 1 tablet by mouth daily. 11/22/19  Yes [provider]  rosuvastatin (CRESTOR) 10 MG tablet Take 10 mg by mouth daily. 11/17/19  Yes [provider]  HYDROcodone-acetaminophen (NORCO/VICODIN) 5-325 MG tablet Take 1 tablet by mouth every 6 (six) hours as needed for moderate pain. Patient not taking: Reported on 12/29/2019 10/05/19   Gabriel Earing, PA-C  Insulin Pen Needle 32G X 4 MM MISC Use with insulin pen to dispense insulin as directed 09/08/16   Tat, Shanon Brow, MD  pentoxifylline (TRENTAL) 400 MG CR tablet Take 1 tablet (400 mg total) by mouth 3 (three) times daily with meals. Patient not taking: Reported on 12/29/2019 06/10/18   Newt Minion, MD  rosuvastatin (CRESTOR) 20 MG tablet Take 1 tablet (20 mg total) by mouth daily. Patient not taking: Reported on 12/29/2019 08/11/19 12/29/19  Terrilee Croak, MD    Social History   Socioeconomic History  . Marital status: Divorced    Spouse name: Not on file  . Number of children: Not on file  . Years of education: Not on file  . Highest education level: Not on file  Occupational History  . Occupation: Retired  Tobacco Use  . Smoking status: Former Smoker    Years: 4.00    Types: Cigarettes    Quit date: 01/19/1984    Years since quitting: 35.9  . Smokeless tobacco: Never Used  Substance and  Sexual Activity  . Alcohol use: No  . Drug use: No  . Sexual activity: Not on file  Other Topics Concern  . Not on file  Social History Narrative   Lives in [place] with her dtr.  Previously worked in a [employer] but has been out on disability since 2009.  Recently moved from disability to "retired" when she turned 75.  Sedentary.  Knits frequently for fun.   Social Determinants of Health   Financial Resource Strain:   . Difficulty of Paying Living Expenses: Not on file  Food Insecurity: Unknown  . Worried About Charity fundraiser in the Last Year: Patient refused  . Ran Out of Food in the Last Year: Patient refused  Transportation Needs: Unknown  . Lack of Transportation (Medical): Patient refused  . Lack of Transportation (Non-Medical): Patient refused  Physical Activity: Unknown  . Days of Exercise per Week: Patient refused  . Minutes of Exercise per Session: Patient refused  Stress: No Stress Concern Present  . Feeling of Stress : Only a little  Social Connections: Unknown  . Frequency of Communication with Friends and Family: Patient refused  . Frequency of Social Gatherings with Friends and Family: Patient refused  . Attends Religious Services: Patient refused  . Active Member of Clubs or Organizations: Patient refused  . Attends Archivist Meetings: Patient refused  . Marital Status: Patient refused  Intimate Partner Violence: Unknown  . Fear of Current or Ex-Partner: Patient refused  . Emotionally Abused: Patient refused  . Physically Abused: Patient refused  . Sexually Abused: Patient refused     Family History  Problem Relation Age of Onset  . Hypertension Mother   . Diabetes Mother   . Hypertension Sister     ROS: Otherwise negative unless mentioned in HPI  Physical Examination  Vitals:   12/31/19 1000 12/31/19 1030  BP: (!) 115/57 (!) 98/57  Pulse: 71 75  Resp:    Temp:    SpO2:     Body mass index is 25.62 kg/m.  General:  WDWN in  NAD Gait: Not observed HENT: WNL, normocephalic Pulmonary: normal non-labored breathing, without Rales, rhonchi,  wheezing Cardiac: regular Abdomen:  soft, NT/ND, no masses Vascular Exam/Pulses: Palpable left radial pulse Extremities: Easily palpable graft throughout left forearm without any areas of fluctuance or other fluid collection; no pain to palpation; no erythema or other sign of infection Musculoskeletal: no muscle wasting or atrophy  Neurologic: A&O X 3;  No focal weakness or paresthesias are detected; speech is fluent/normal Psychiatric:  The pt has Normal affect. Lymph:  Unremarkable  CBC    Component Value Date/Time   WBC 11.5 (H) 12/31/2019 0720   RBC 3.34 (L) 12/31/2019 0720   HGB 9.2 (L) 12/31/2019 0720   HCT 29.5 (L)  12/31/2019 0720   PLT 235 12/31/2019 0720   MCV 88.3 12/31/2019 0720   MCH 27.5 12/31/2019 0720   MCHC 31.2 12/31/2019 0720   RDW 14.2 12/31/2019 0720   LYMPHSABS 2.3 08/13/2019 0200   MONOABS 0.6 08/13/2019 0200   EOSABS 0.2 08/13/2019 0200   BASOSABS 0.0 08/13/2019 0200    BMET    Component Value Date/Time   NA 135 12/31/2019 0720   K 3.6 12/31/2019 0720   CL 97 (L) 12/31/2019 0720   CO2 23 12/31/2019 0720   GLUCOSE 89 12/31/2019 0720   BUN 45 (H) 12/31/2019 0720   CREATININE 6.06 (H) 12/31/2019 0720   CALCIUM 8.4 (L) 12/31/2019 0720   GFRNONAA 7 (L) 12/31/2019 0720   GFRAA 8 (L) 12/31/2019 0720    COAGS: Lab Results  Component Value Date   INR 1.1 12/29/2019   INR 0.98 07/13/2015      ASSESSMENT/PLAN: This is a 69 y.o. female with bacteremia of unknown source; vascular was consulted for evaluation of left arm AV graft  -Forearm loop graft patent and working well during HD this morning -Easily palpable path of graft through left forearm without any areas of fluctuance, severe pain, erythema, or other sign that would suggest that graft is the source of infection -Reconsult vascular if left forearm loop graft exam  changes   Dagoberto Ligas PA-C [facility] [phone]  I have seen and evaluated the patient. I agree with the PA note as documented above.  69 year old female with end-stage renal disease and bacteremia that vascular surgery was asked to evaluate the left forearm loop graft given unclear source.  On exam she has no evidence of cellulitis over the graft, no erythema, no fluctuance, no pain.  Ultimately the graft was cannulated without issue this morning and is working well.  I see no role for vascular surgery intervention at this time and no obvious evidence that her forearm graft is the source.  Certainly can call us back to reexamine her if her exam changes but otherwise looks very good at this time.  Marty Heck, MD [facility] Office: [phone]

## 2019-12-31 NOTE — Progress Notes (Addendum)
   Subjective: Pt seen at dialysis this morning. Tolerating the session well. No pain with cannulation today. Denies fevers/chills, shortness of breath, or chest pain. No acute concerns at this time.  Objective:  Vital signs in last 24 hours: Vitals:   12/30/19 2100 12/31/19 0022 12/31/19 0354 12/31/19 0414  BP:  (!) 146/67 (!) 152/67   Pulse: 76 85 92 89  Resp: 20 16 (!) 30 20  Temp:  99.5 F (37.5 C) (!) 100.4 F (38 C)   TempSrc:  Oral Oral   SpO2: 97% 96% 96% 96%  Weight:      Height:       Physical Exam Vitals and nursing note reviewed.  Constitutional:      General: She is not in acute distress.    Appearance: She is not ill-appearing.     Comments: Pt sleeping on approach, easily awakens, alert and oriented.  Pulmonary:     Effort: Pulmonary effort is normal. No respiratory distress.     Breath sounds: Normal breath sounds.     Comments: Saturating at 100% on 2L [place]. Skin:    General: Skin is warm and dry.  Neurological:     Mental Status: She is alert.    Assessment/Plan:  Principal Problem:   MSSA bacteremia Active Problems:   Essential hypertension   Uncontrolled type 2 diabetes mellitus with hyperglycemia, with long-term current use of insulin (HCC)   CAP (community acquired pneumonia)   Chronic diastolic CHF (congestive heart failure) (HCC)   Acute respiratory failure with hypoxemia ([place])  [name] is a 69 year old F with significant PMH of ESRD on TTS dialysis, type II diabetes, and HFpEF, who presented due to worsening shortness of breath and was found to be febrile to 103, hypoxic requiring BiPAP, tachypnic, and tachycardic all concerning for sepsis. Found to have MSSA growing in blood cultures on 3/4.  MSSA bacteremia Acute hypoxic respiratory failure Pt presented with fever, hypoxia, and shortness of breath concerning for volume overload superimposed on sepsis. Initially required HFNC and BiPAP. Received one dose ceftriaxone/azithromycin the  morning of 3/4 and one dose vancomycin that evening.  - 2 of 2 blood cultures from 12/29/2019 growing MSSA - repeat cultures drawn this morning 3/6 - on 2L [place] at dialysis - T max 100.4 overnight - mild leukocytosis 11.5 << 9.8 << 17.4 - antibiotic regimen with cefazolin and rifampin (day 2)  ID consulted, appreciate their recommendations - TTE without evidence of endocarditis - VVS eval of graft and potential imaging  ESRD on TTS HD - pt two weeks into HD Began HD on 12/15/2019, using LUE AVG for access Nephrology consulted, appreciate recommendations. - HD today - tolerated cannulation, access site without erythema or tenderness - no iron given bacteremia  T2DM - well controlled Pt on 18units Levemir at home. A1c on admission 6.5. - AM glc 102 - levemir 8 units + novolog 3 units TID with meals  - moderate + qhs SSI  HTN - continue home carvedilol 6.25mg  BID - holding home amlodipine 10mg  daily  Prior to Admission Living Arrangement: home Anticipated Discharge Location: home with home health Barriers to Discharge: outpatient antibiotic regimen/coordination Dispo: Anticipated discharge in approximately 1-2 day(s).   Ladona Horns, MD 12/31/2019, 6:24 AM Pager: [phone]

## 2019-12-31 NOTE — Progress Notes (Signed)
[facility] NEPHROLOGY PROGRESS NOTE  Assessment/ Plan: Pt is a 69 y.o. yo female with DM, HTN, ESRD on HD admitted with shortness of breath, hypoxia and pneumonia.  Dialysis Orders: Center: [facility]  on TTS. Time: 4 hours, BFR 400/DFR 800, 2K/2Ca, EDW 69kg, UF profile: none, LUE AVG. Heparin 2200 unit bolus Mircera 75 mcg IV q 2 weeks- last dose 123mcg on 3/2 Venfoer 100 mg IV q HD x 10 doses- received 5 so far Calcitriol 0.25 mcg PO TID with HD  #MSSA bacteremia/acute hypoxia/CAP: Received ceftriaxone and azithromycin on admission.  Blood culture growing MSSA therefore antibiotics changed to cefazolin.  Echo with no vegetation.  Do not think AV graft is infected.  ID is following.  # ESRD TTS: Receiving dialysis today.  No problem with cannulation.  The access site has no erythema or tenderness to suggest infection or fluid collection.  Discussed with HD nurse.  Tolerating dialysis well.    # Anemia of ESRD: Received Mircera on 3/2.  No iron because of bacteremia.  Monitor hemoglobin.  # Secondary hyperparathyroidism: Phosphorus level at goal, started [place] on 3/5.  Resume calcitriol.  # HTN/volume: Continue to monitor.  BP acceptable.  Subjective: Seen and examined.  Tolerating dialysis well.  Denies nausea vomiting chest pain shortness of breath.  Objective Vital signs in last 24 hours: Vitals:   12/31/19 0700 12/31/19 0730 12/31/19 0800 12/31/19 0830  BP: 139/70 124/71 133/67 129/67  Pulse: 74 73 70 70  Resp: 12 11 12 13   Temp:      TempSrc:      SpO2:  100% 100% 100%  Weight:      Height:       Weight change: -8.051 kg  Intake/Output Summary (Last 24 hours) at 12/31/2019 0904 Last data filed at 12/31/2019 0111 Gross per 24 hour  Intake 140 ml  Output --  Net 140 ml       Labs: Basic Metabolic Panel: Recent Labs  Lab 12/29/19 0847 12/29/19 0847 12/29/19 0856 12/30/19 0806 12/31/19 0720  NA 135   < > 133* 136 135  K 4.4   <  > 4.2 4.0 3.6  CL 95*   < > 99 95* 97*  CO2 24  --   --  26 23  GLUCOSE 334*   < > 338* 153* 89  BUN 38*   < > 39* 26* 45*  CREATININE 4.87*   < > 5.10* 4.26* 6.06*  CALCIUM 9.0  --   --  8.9 8.4*  PHOS  --   --   --  6.5* 3.7   < > = values in this interval not displayed.   Liver Function Tests: Recent Labs  Lab 12/29/19 0847 12/30/19 0806 12/31/19 0720  AST 40  --   --   ALT 30  --   --   ALKPHOS 90  --   --   BILITOT 1.2  --   --   PROT 7.2  --   --   ALBUMIN 3.4* 2.8* 2.4*   No results for input(s): LIPASE, AMYLASE in the last 168 hours. No results for input(s): AMMONIA in the last 168 hours. CBC: Recent Labs  Lab 12/29/19 0847 12/29/19 0847 12/29/19 0856 12/30/19 0215 12/31/19 0720  WBC 17.4*  --   --  9.8 11.5*  HGB 10.8*   < > 11.6* 9.2* 9.2*  HCT 34.9*   < > 34.0* 29.5* 29.5*  MCV 89.5  --   --  87.8 88.3  PLT 265  --   --  220 235   < > = values in this interval not displayed.   Cardiac Enzymes: No results for input(s): CKTOTAL, CKMB, CKMBINDEX, TROPONINI in the last 168 hours. CBG: Recent Labs  Lab 12/30/19 0859 12/30/19 1314 12/30/19 1615 12/30/19 2103 12/31/19 0624  GLUCAP 134* 137* 172* 134* 102*    Iron Studies: No results for input(s): IRON, TIBC, TRANSFERRIN, FERRITIN in the last 72 hours. Studies/Results: ECHOCARDIOGRAM COMPLETE  Result Date: 12/30/2019    ECHOCARDIOGRAM REPORT   Patient Name:   Melissa Howe Date of Exam: 12/30/2019 Medical Rec #:  [id number]         Height:       64.0 in Accession #:    [id number]        Weight:       167.0 lb Date of Birth:  1951-09-08          BSA:          1.812 m Patient Age:    73 years          BP:           143/75 mmHg Patient Gender: F                 HR:           73 bpm. Exam Location:  Inpatient Procedure: 2D Echo, Cardiac Doppler and Color Doppler Indications:    Bacteremia 790.7  History:        Patient has prior history of Echocardiogram examinations, most                 recent 08/06/2019. CHF,  Signs/Symptoms:Dyspnea and Syncope; Risk                 Factors:Hypertension, Diabetes, Dyslipidemia and Former Smoker.  Sonographer:    Vickie Epley RDCS Referring Phys: [id number] [address]  1. Left ventricular ejection fraction, by estimation, is 45 to 50%. The left ventricle has mildly decreased function. The left ventricle demonstrates global hypokinesis. Left ventricular diastolic parameters are consistent with Grade II diastolic dysfunction (pseudonormalization).  2. Right ventricular systolic function is normal. The right ventricular size is normal. There is normal pulmonary artery systolic pressure.  3. Left atrial size was mildly dilated.  4. The mitral valve is normal in structure and function. Trivial mitral valve regurgitation.  5. The aortic valve is normal in structure and function. Aortic valve regurgitation is not visualized.  6. The inferior vena cava is normal in size with greater than 50% respiratory variability, suggesting right atrial pressure of 3 mmHg. FINDINGS  Left Ventricle: Left ventricular ejection fraction, by estimation, is 45 to 50%. The left ventricle has mildly decreased function. The left ventricle demonstrates global hypokinesis. The left ventricular internal cavity size was normal in size. There is  no left ventricular hypertrophy. Left ventricular diastolic parameters are consistent with Grade II diastolic dysfunction (pseudonormalization). Right Ventricle: The right ventricular size is normal. No increase in right ventricular wall thickness. Right ventricular systolic function is normal. There is normal pulmonary artery systolic pressure. The tricuspid regurgitant velocity is 2.22 m/s, and  with an assumed right atrial pressure of 3 mmHg, the estimated right ventricular systolic pressure is 20.3 mmHg. Left Atrium: Left atrial size was mildly dilated. Right Atrium: Right atrial size was normal in size. Pericardium: There is no evidence of pericardial effusion. Mitral  Valve: The mitral valve is normal in structure  and function. Trivial mitral valve regurgitation. Tricuspid Valve: The tricuspid valve is normal in structure. Tricuspid valve regurgitation is trivial. Aortic Valve: The aortic valve is normal in structure and function. Aortic valve regurgitation is not visualized. Pulmonic Valve: The pulmonic valve was grossly normal. Pulmonic valve regurgitation is trivial. Aorta: The aortic root and ascending aorta are structurally normal, with no evidence of dilitation. Venous: The inferior vena cava is normal in size with greater than 50% respiratory variability, suggesting right atrial pressure of 3 mmHg. IAS/Shunts: No atrial level shunt detected by color flow Doppler.  LEFT VENTRICLE PLAX 2D LVIDd:         4.50 cm      Diastology LVIDs:         3.50 cm      LV e' lateral:   4.87 cm/s LV PW:         0.80 cm      LV E/e' lateral: 19.2 LV IVS:        0.80 cm      LV e' medial:    3.89 cm/s LVOT diam:     1.80 cm      LV E/e' medial:  24.0 LV SV:         50 LV SV Index:   27 LVOT Area:     2.54 cm  LV Volumes (MOD) LV vol d, MOD A2C: 102.0 ml LV vol d, MOD A4C: 111.0 ml LV vol s, MOD A2C: 56.7 ml LV vol s, MOD A4C: 58.0 ml LV SV MOD A2C:     45.3 ml LV SV MOD A4C:     111.0 ml LV SV MOD BP:      51.1 ml RIGHT VENTRICLE RV S prime:     13.20 cm/s TAPSE (M-mode): 2.0 cm LEFT ATRIUM             Index       RIGHT ATRIUM          Index LA diam:        3.40 cm 1.88 cm/m  RA Area:     8.86 cm LA Vol (A2C):   29.4 ml 16.22 ml/m RA Volume:   17.80 ml 9.82 ml/m LA Vol (A4C):   44.7 ml 24.67 ml/m LA Biplane Vol: 38.4 ml 21.19 ml/m  AORTIC VALVE LVOT Vmax:   91.40 cm/s LVOT Vmean:  61.300 cm/s LVOT VTI:    0.195 m  AORTA Ao Root diam: 3.10 cm MITRAL VALVE               TRICUSPID VALVE MV Area (PHT): 2.69 cm    TR Peak grad:   19.7 mmHg MV Decel Time: 282 msec    TR Vmax:        222.00 cm/s MV E velocity: 93.40 cm/s MV A velocity: 45.80 cm/s  SHUNTS MV E/A ratio:  2.04        Systemic  VTI:  0.20 m                            Systemic Diam: 1.80 cm Glori Bickers MD Electronically signed by Glori Bickers MD Signature Date/Time: 12/30/2019/6:38:24 PM    Final     Medications: Infusions: . sodium chloride    . sodium chloride    .  ceFAZolin (ANCEF) IV 1 g (12/31/19 0111)    Scheduled Medications: . calcitRIOL  0.25 mcg Oral Q T,Th,Sa-HD  . carvedilol  6.25 mg  Oral BID WC  . Chlorhexidine Gluconate Cloth  6 each Topical Q0600  . Chlorhexidine Gluconate Cloth  6 each Topical Q0600  . ferric citrate  420 mg Oral TID WC  . heparin  2,200 Units Dialysis Once in dialysis  . heparin  5,000 Units Subcutaneous Q8H  . insulin aspart  0-15 Units Subcutaneous TID WC  . insulin aspart  0-5 Units Subcutaneous QHS  . insulin aspart  3 Units Subcutaneous TID WC  . insulin detemir  8 Units Subcutaneous QHS  . psyllium  1 packet Oral Daily  . rifampin  300 mg Oral BID WC  . senna-docusate  1 tablet Oral BID    have reviewed scheduled and prn medications.  Physical Exam: General:NAD, comfortable Heart:RRR, s1s2 nl Lungs: Bibasal crackles, no increased work of breathing Abdomen:soft, Non-tender, non-distended Extremities:No edema Dialysis Access: Left upper extremity AV graft has thrill and bruit.  No erythema, tenderness. Preethi Scantlebury Tanna Furry 12/31/2019,9:04 AM  LOS: 2 days  Pager: [phone]

## 2020-01-01 ENCOUNTER — Inpatient Hospital Stay (HOSPITAL_COMMUNITY): Payer: Medicare Other

## 2020-01-01 LAB — GLUCOSE, CAPILLARY
Glucose-Capillary: 96 mg/dL (ref 70–99)
Glucose-Capillary: 77 mg/dL (ref 70–99)
Glucose-Capillary: 182 mg/dL — ABNORMAL HIGH (ref 70–99)
Glucose-Capillary: 175 mg/dL — ABNORMAL HIGH (ref 70–99)
Glucose-Capillary: 171 mg/dL — ABNORMAL HIGH (ref 70–99)

## 2020-01-01 MED ORDER — INSULIN ASPART 100 UNIT/ML ~~LOC~~ SOLN
0.0000 [IU] | Freq: Every day | SUBCUTANEOUS | Status: DC
  Administered 2020-01-02: 3 [IU] via SUBCUTANEOUS

## 2020-01-01 MED ORDER — INSULIN ASPART 100 UNIT/ML ~~LOC~~ SOLN: 0.0000 [IU] | Freq: Three times a day (TID) | SUBCUTANEOUS | Status: DC

## 2020-01-01 MED ORDER — INSULIN ASPART 100 UNIT/ML ~~LOC~~ SOLN
0.0000 [IU] | Freq: Three times a day (TID) | SUBCUTANEOUS | Status: DC
  Administered 2020-01-01 – 2020-01-03 (×3): 3 [IU] via SUBCUTANEOUS

## 2020-01-01 NOTE — Progress Notes (Signed)
   Subjective:   Feeling well this morning. Denies shortness of breath or other concerns.   Objective:  Vital signs in last 24 hours: Vitals:   12/31/19 2000 12/31/19 2019 01/01/20 0000 01/01/20 0400  BP: (!) 152/70 (!) 152/70 136/64 129/63  Pulse:  71    Resp:  18    Temp:  98.9 F (37.2 C) 99.1 F (37.3 C) 99.4 F (37.4 C)  TempSrc:  Oral Oral Axillary  SpO2:  97%    Weight:      Height:       Constitution: NAD, appears stated age Cardio: RRR, no m/r/g, no LE edema  Respiratory: CTA, no w/r/r Neuro: normal affect, a&ox3 Skin: c/d/i, graft site w/o swelling or erythema   Assessment/Plan:  Principal Problem:   MSSA bacteremia Active Problems:   Essential hypertension   Uncontrolled type 2 diabetes mellitus with hyperglycemia, with long-term current use of insulin (HCC)   CAP (community acquired pneumonia)   Chronic diastolic CHF (congestive heart failure) (HCC)   Acute respiratory failure with hypoxemia (HCC)   End stage renal disease on dialysis [facility])  69 year old female with past medical history of end-stage renal disease starting dialysis recently Tuesday Thursday Saturday, type 2 diabetes, and HFpEF.  She presented with worsening shortness of breath and was found to be febrile to 103, hypoxic, and tachycardic concerning for sepsis positive for MSSA bacteremia without clear source.  MSSA Bacteremia Significantly improved since admission. Appreciated ID's recommendations. She is on cefazolin and rifampin with unclear source of her bacteremia. Continue cefazolin at least 14 days from negative blood cultures, 3/4 NGTD. Graft without signs of infection, evaluated by vascular as well. TTE without endocarditis.   - TEE scheduled for tomorrow - NPO at midnight  - continue cefazolin with HD  - f/u thorcic/lumbar MRI - daily cbc, rfp - f/u blood cultures  ESRD on HD HD yesterday. Nephrology consulted and appreciate recommendations.  - am labs  TIIDM Well controlled.  Slightly decreased this am. a1c 6.5.  - cont. SSI + qhs SSI  - cont. levemir 8U - hold 3U  VTE: heparin IVF: none Diet: renal/CM, npo@midnight  Code: full   Dispo: Anticipated discharge pending blood cultures.   Marty Heck, DO 01/01/2020, 6:47 AM Pager: [phone]

## 2020-01-01 NOTE — Progress Notes (Signed)
  [facility] has been requested to perform a transesophageal echocardiogram on Melissa Howe for bacteremia.  After careful review of history and examination, the risks and benefits of transesophageal echocardiogram have been explained including risks of esophageal damage, perforation (1:10,000 risk), bleeding, pharyngeal hematoma as well as other potential complications associated with conscious sedation including aspiration, arrhythmia, respiratory failure and death. Alternatives to treatment were discussed, questions were answered. Patient is willing to proceed.   Richardson Dopp, PA-C  01/01/2020 12:08 PM

## 2020-01-01 NOTE — Progress Notes (Addendum)
[facility] PROGRESS [facility]  Assessment/ Plan: Pt is a 69 y.o. yo female with DM, HTN, ESRD on HD admitted with shortness of breath, hypoxia and pneumonia.  Dialysis Orders: Center: [facility]  on TTS. Time: 4 hours, BFR 400/DFR 800, 2K/2Ca, EDW 69kg, UF profile: none, LUE AVG. Heparin 2200 unit bolus Mircera 75 mcg IV q 2 weeks- last dose 162mcg on 3/2 Venfoer 100 mg IV q HD x 10 doses- received 5 so far Calcitriol 0.25 mcg PO TID with HD  #MSSA bacteremia/acute hypoxia/CAP: Received ceftriaxone and azithromycin on admission.  Blood culture growing MSSA therefore antibiotics changed to cefazolin.  Echo with no vegetation.  Do not think AV graft is infected.  ID is following.  Plan for TEE and MRI of his spine.  Seen by vascular as well.  # ESRD TTS: Status post HD with 2 L UF, tolerated well.  No problem with AV graft cannulation.  The access site has no erythema, tenderness or fluctuant mass.  Plan for next HD on 3/9.  # Anemia of ESRD: Received Mircera on 3/2.  No iron because of bacteremia.  Monitor hemoglobin.  # Secondary hyperparathyroidism: Phosphorus level at goal, started [place] on 3/5.  Resume calcitriol.  # HTN/volume: Continue to monitor.  BP acceptable.  Subjective: Seen and examined.  Denied nausea, vomiting, chest pain or shortness of breath.  Eating breakfast.  Objective Vital signs in last 24 hours: Vitals:   12/31/19 2019 01/01/20 0000 01/01/20 0400 01/01/20 0752  BP: (!) 152/70 136/64 129/63 (!) 161/78  Pulse: 71   76  Resp: 18   17  Temp: 98.9 F (37.2 C) 99.1 F (37.3 C) 99.4 F (37.4 C) 97.9 F (36.6 C)  TempSrc: Oral Oral Axillary Oral  SpO2: 97%   99%  Weight:      Height:       Weight change: -3 kg  Intake/Output Summary (Last 24 hours) at 01/01/2020 1004 Last data filed at 12/31/2019 1100 Gross per 24 hour  Intake --  Output 2000 ml  Net -2000 ml       Labs: Basic Metabolic Panel: Recent Labs  Lab  12/29/19 0847 12/29/19 0847 12/29/19 0856 12/30/19 0806 12/31/19 0720  NA 135   < > 133* 136 135  K 4.4   < > 4.2 4.0 3.6  CL 95*   < > 99 95* 97*  CO2 24  --   --  26 23  GLUCOSE 334*   < > 338* 153* 89  BUN 38*   < > 39* 26* 45*  CREATININE 4.87*   < > 5.10* 4.26* 6.06*  CALCIUM 9.0  --   --  8.9 8.4*  PHOS  --   --   --  6.5* 3.7   < > = values in this interval not displayed.   Liver Function Tests: Recent Labs  Lab 12/29/19 0847 12/30/19 0806 12/31/19 0720  AST 40  --   --   ALT 30  --   --   ALKPHOS 90  --   --   BILITOT 1.2  --   --   PROT 7.2  --   --   ALBUMIN 3.4* 2.8* 2.4*   No results for input(s): LIPASE, AMYLASE in the last 168 hours. No results for input(s): AMMONIA in the last 168 hours. CBC: Recent Labs  Lab 12/29/19 0847 12/29/19 0847 12/29/19 0856 12/30/19 0215 12/31/19 0720  WBC 17.4*  --   --  9.8 11.5*  HGB 10.8*   < > 11.6* 9.2* 9.2*  HCT 34.9*   < > 34.0* 29.5* 29.5*  MCV 89.5  --   --  87.8 88.3  PLT 265  --   --  220 235   < > = values in this interval not displayed.   Cardiac Enzymes: No results for input(s): CKTOTAL, CKMB, CKMBINDEX, TROPONINI in the last 168 hours. CBG: Recent Labs  Lab 12/31/19 1211 12/31/19 1630 12/31/19 2113 01/01/20 0103 01/01/20 0752  GLUCAP 136* 171* 139* 96 77    Iron Studies: No results for input(s): IRON, TIBC, TRANSFERRIN, FERRITIN in the last 72 hours. Studies/Results: ECHOCARDIOGRAM COMPLETE  Result Date: 12/30/2019    ECHOCARDIOGRAM REPORT   Patient Name:   Melissa Howe Date of Exam: 12/30/2019 Medical Rec #:  [id number]         Height:       64.0 in Accession #:    [id number]        Weight:       167.0 lb Date of Birth:  11-16-1950          BSA:          1.812 m Patient Age:    28 years          BP:           143/75 mmHg Patient Gender: F                 HR:           73 bpm. Exam Location:  Inpatient Procedure: 2D Echo, Cardiac Doppler and Color Doppler Indications:    Bacteremia 790.7   History:        Patient has prior history of Echocardiogram examinations, most                 recent 08/06/2019. CHF, Signs/Symptoms:Dyspnea and Syncope; Risk                 Factors:Hypertension, Diabetes, Dyslipidemia and Former Smoker.  Sonographer:    Vickie Epley RDCS Referring Phys: [id number] [address]  1. Left ventricular ejection fraction, by estimation, is 45 to 50%. The left ventricle has mildly decreased function. The left ventricle demonstrates global hypokinesis. Left ventricular diastolic parameters are consistent with Grade II diastolic dysfunction (pseudonormalization).  2. Right ventricular systolic function is normal. The right ventricular size is normal. There is normal pulmonary artery systolic pressure.  3. Left atrial size was mildly dilated.  4. The mitral valve is normal in structure and function. Trivial mitral valve regurgitation.  5. The aortic valve is normal in structure and function. Aortic valve regurgitation is not visualized.  6. The inferior vena cava is normal in size with greater than 50% respiratory variability, suggesting right atrial pressure of 3 mmHg. FINDINGS  Left Ventricle: Left ventricular ejection fraction, by estimation, is 45 to 50%. The left ventricle has mildly decreased function. The left ventricle demonstrates global hypokinesis. The left ventricular internal cavity size was normal in size. There is  no left ventricular hypertrophy. Left ventricular diastolic parameters are consistent with Grade II diastolic dysfunction (pseudonormalization). Right Ventricle: The right ventricular size is normal. No increase in right ventricular wall thickness. Right ventricular systolic function is normal. There is normal pulmonary artery systolic pressure. The tricuspid regurgitant velocity is 2.22 m/s, and  with an assumed right atrial pressure of 3 mmHg, the estimated right ventricular systolic pressure is 75.1 mmHg. Left Atrium: Left atrial  size was mildly dilated.  Right Atrium: Right atrial size was normal in size. Pericardium: There is no evidence of pericardial effusion. Mitral Valve: The mitral valve is normal in structure and function. Trivial mitral valve regurgitation. Tricuspid Valve: The tricuspid valve is normal in structure. Tricuspid valve regurgitation is trivial. Aortic Valve: The aortic valve is normal in structure and function. Aortic valve regurgitation is not visualized. Pulmonic Valve: The pulmonic valve was grossly normal. Pulmonic valve regurgitation is trivial. Aorta: The aortic root and ascending aorta are structurally normal, with no evidence of dilitation. Venous: The inferior vena cava is normal in size with greater than 50% respiratory variability, suggesting right atrial pressure of 3 mmHg. IAS/Shunts: No atrial level shunt detected by color flow Doppler.  LEFT VENTRICLE PLAX 2D LVIDd:         4.50 cm      Diastology LVIDs:         3.50 cm      LV e' lateral:   4.87 cm/s LV PW:         0.80 cm      LV E/e' lateral: 19.2 LV IVS:        0.80 cm      LV e' medial:    3.89 cm/s LVOT diam:     1.80 cm      LV E/e' medial:  24.0 LV SV:         50 LV SV Index:   27 LVOT Area:     2.54 cm  LV Volumes (MOD) LV vol d, MOD A2C: 102.0 ml LV vol d, MOD A4C: 111.0 ml LV vol s, MOD A2C: 56.7 ml LV vol s, MOD A4C: 58.0 ml LV SV MOD A2C:     45.3 ml LV SV MOD A4C:     111.0 ml LV SV MOD BP:      51.1 ml RIGHT VENTRICLE RV S prime:     13.20 cm/s TAPSE (M-mode): 2.0 cm LEFT ATRIUM             Index       RIGHT ATRIUM          Index LA diam:        3.40 cm 1.88 cm/m  RA Area:     8.86 cm LA Vol (A2C):   29.4 ml 16.22 ml/m RA Volume:   17.80 ml 9.82 ml/m LA Vol (A4C):   44.7 ml 24.67 ml/m LA Biplane Vol: 38.4 ml 21.19 ml/m  AORTIC VALVE LVOT Vmax:   91.40 cm/s LVOT Vmean:  61.300 cm/s LVOT VTI:    0.195 m  AORTA Ao Root diam: 3.10 cm MITRAL VALVE               TRICUSPID VALVE MV Area (PHT): 2.69 cm    TR Peak grad:   19.7 mmHg MV Decel Time: 282 msec    TR  Vmax:        222.00 cm/s MV E velocity: 93.40 cm/s MV A velocity: 45.80 cm/s  SHUNTS MV E/A ratio:  2.04        Systemic VTI:  0.20 m                            Systemic Diam: 1.80 cm Glori Bickers MD Electronically signed by Glori Bickers MD Signature Date/Time: 12/30/2019/6:38:24 PM    Final     Medications: Infusions: .  ceFAZolin (ANCEF) IV 1 g (12/31/19 2349)  Scheduled Medications: . calcitRIOL  0.25 mcg Oral Q T,Th,Sa-HD  . carvedilol  6.25 mg Oral BID WC  . Chlorhexidine Gluconate Cloth  6 each Topical Q0600  . Chlorhexidine Gluconate Cloth  6 each Topical Q0600  . ferric citrate  420 mg Oral TID WC  . heparin  5,000 Units Subcutaneous Q8H  . insulin aspart  0-15 Units Subcutaneous TID WC  . insulin aspart  0-5 Units Subcutaneous QHS  . insulin detemir  8 Units Subcutaneous QHS  . psyllium  1 packet Oral Daily  . rifampin  300 mg Oral BID WC  . senna-docusate  1 tablet Oral BID    have reviewed scheduled and prn medications.  Physical Exam: General: Sitting on bed and eating breakfast, not in distress Heart:RRR, s1s2 nl Lungs: Clear bilateral, no increased work of breathing Abdomen:soft, Non-tender, non-distended Extremities:No edema Dialysis Access: Left upper extremity AV graft has thrill and bruit.  No erythema, tenderness. Schuyler Olden Tanna Furry 01/01/2020,10:04 AM  LOS: 3 days  Pager: [phone]

## 2020-01-02 ENCOUNTER — Encounter (HOSPITAL_COMMUNITY)
Admission: EM | Disposition: A | Discharge status: Home / Self Care | Payer: Self-pay | Source: Home / Self Care | Attending: Internal Medicine

## 2020-01-02 ENCOUNTER — Inpatient Hospital Stay (HOSPITAL_COMMUNITY): Payer: Medicare Other

## 2020-01-02 ENCOUNTER — Encounter (HOSPITAL_COMMUNITY): Payer: Self-pay | Admitting: Internal Medicine

## 2020-01-02 ENCOUNTER — Inpatient Hospital Stay (HOSPITAL_COMMUNITY): Payer: Medicare Other | Admitting: Anesthesiology

## 2020-01-02 DIAGNOSIS — R011 Cardiac murmur, unspecified: Secondary | ICD-10-CM

## 2020-01-02 DIAGNOSIS — I340 Nonrheumatic mitral (valve) insufficiency: Secondary | ICD-10-CM

## 2020-01-02 DIAGNOSIS — R7881 Bacteremia: Secondary | ICD-10-CM

## 2020-01-02 HISTORY — PX: TEE WITHOUT CARDIOVERSION: SHX5443

## 2020-01-02 HISTORY — PX: BUBBLE STUDY: SHX6837

## 2020-01-02 LAB — CBC WITH DIFFERENTIAL/PLATELET
Abs Immature Granulocytes: 0.15 10*3/uL — ABNORMAL HIGH (ref 0.00–0.07)
Basophils Absolute: 0.1 10*3/uL (ref 0.0–0.1)
Basophils Relative: 1 %
Eosinophils Absolute: 0.4 10*3/uL (ref 0.0–0.5)
Eosinophils Relative: 5 %
HCT: 31.4 % — ABNORMAL LOW (ref 36.0–46.0)
Hemoglobin: 9.8 g/dL — ABNORMAL LOW (ref 12.0–15.0)
Immature Granulocytes: 2 %
Lymphocytes Relative: 39 %
Lymphs Abs: 3.4 10*3/uL (ref 0.7–4.0)
MCH: 27 pg (ref 26.0–34.0)
MCHC: 31.2 g/dL (ref 30.0–36.0)
MCV: 86.5 fL (ref 80.0–100.0)
Monocytes Absolute: 0.9 10*3/uL (ref 0.1–1.0)
Monocytes Relative: 11 %
Neutro Abs: 3.7 10*3/uL (ref 1.7–7.7)
Neutrophils Relative %: 42 %
Platelets: 286 10*3/uL (ref 150–400)
RBC: 3.63 MIL/uL — ABNORMAL LOW (ref 3.87–5.11)
RDW: 14.1 % (ref 11.5–15.5)
WBC: 8.6 10*3/uL (ref 4.0–10.5)
nRBC: 0 % (ref 0.0–0.2)

## 2020-01-02 LAB — RENAL FUNCTION PANEL
Albumin: 2.5 g/dL — ABNORMAL LOW (ref 3.5–5.0)
Anion gap: 13 (ref 5–15)
BUN: 39 mg/dL — ABNORMAL HIGH (ref 8–23)
CO2: 26 mmol/L (ref 22–32)
Calcium: 8.9 mg/dL (ref 8.9–10.3)
Chloride: 96 mmol/L — ABNORMAL LOW (ref 98–111)
Creatinine, Ser: 6.07 mg/dL — ABNORMAL HIGH (ref 0.44–1.00)
GFR calc Af Amer: 8 mL/min — ABNORMAL LOW (ref >60)
GFR calc non Af Amer: 6 mL/min — ABNORMAL LOW (ref >60)
Glucose, Bld: 168 mg/dL — ABNORMAL HIGH (ref 70–99)
Phosphorus: 3.9 mg/dL (ref 2.5–4.6)
Potassium: 3.5 mmol/L (ref 3.5–5.1)
Sodium: 135 mmol/L (ref 135–145)

## 2020-01-02 LAB — GLUCOSE, CAPILLARY
Glucose-Capillary: 91 mg/dL (ref 70–99)
Glucose-Capillary: 83 mg/dL (ref 70–99)
Glucose-Capillary: 94 mg/dL (ref 70–99)
Glucose-Capillary: 261 mg/dL — ABNORMAL HIGH (ref 70–99)

## 2020-01-02 SURGERY — ECHOCARDIOGRAM, TRANSESOPHAGEAL
Anesthesia: Monitor Anesthesia Care

## 2020-01-02 MED ORDER — SODIUM CHLORIDE 0.9 % IV SOLN: INTRAVENOUS | Status: DC

## 2020-01-02 MED ORDER — PROPOFOL 10 MG/ML IV BOLUS
INTRAVENOUS | Status: DC | PRN
  Administered 2020-01-02: 10 mg via INTRAVENOUS
  Administered 2020-01-02 (×2): 8 mg via INTRAVENOUS
  Administered 2020-01-02: 11 mg via INTRAVENOUS

## 2020-01-02 MED ORDER — LABETALOL HCL 5 MG/ML IV SOLN
INTRAVENOUS | Status: AC
  Filled 2020-01-02: qty 4

## 2020-01-02 MED ORDER — LIDOCAINE HCL (CARDIAC) PF 100 MG/5ML IV SOSY
PREFILLED_SYRINGE | INTRAVENOUS | Status: DC | PRN
  Administered 2020-01-02: 50 mg via INTRAVENOUS

## 2020-01-02 MED ORDER — CHLORHEXIDINE GLUCONATE CLOTH 2 % EX PADS
6.0000 | MEDICATED_PAD | Freq: Every day | CUTANEOUS | Status: DC
  Administered 2020-01-03: 6 via TOPICAL

## 2020-01-02 MED ORDER — HYDRALAZINE HCL 20 MG/ML IJ SOLN
5.0000 mg | Freq: Once | INTRAMUSCULAR | Status: AC
  Administered 2020-01-02: 5 mg via INTRAVENOUS

## 2020-01-02 MED ORDER — HYDRALAZINE HCL 20 MG/ML IJ SOLN
INTRAMUSCULAR | Status: AC
  Filled 2020-01-02: qty 1

## 2020-01-02 MED ORDER — HYDRALAZINE HCL 20 MG/ML IJ SOLN
10.0000 mg | Freq: Once | INTRAMUSCULAR | Status: AC
  Administered 2020-01-02: 14:00:00 10 mg via INTRAVENOUS

## 2020-01-02 MED ORDER — AMLODIPINE BESYLATE 10 MG PO TABS
10.0000 mg | ORAL_TABLET | Freq: Every day | ORAL | Status: DC
  Administered 2020-01-02: 10 mg via ORAL
  Filled 2020-01-02: qty 1

## 2020-01-02 MED ORDER — BUTAMBEN-TETRACAINE-BENZOCAINE 2-2-14 % EX AERO
INHALATION_SPRAY | CUTANEOUS | Status: DC | PRN
  Administered 2020-01-02: 2 via TOPICAL

## 2020-01-02 MED ORDER — LABETALOL HCL 5 MG/ML IV SOLN
5.0000 mg | Freq: Once | INTRAVENOUS | Status: AC
  Administered 2020-01-02: 5 mg via INTRAVENOUS

## 2020-01-02 MED ORDER — PROPOFOL 500 MG/50ML IV EMUL
INTRAVENOUS | Status: DC | PRN
  Administered 2020-01-02: 100 ug/kg/min via INTRAVENOUS

## 2020-01-02 MED ORDER — CEFAZOLIN SODIUM-DEXTROSE 2-4 GM/100ML-% IV SOLN: 2.0000 g | INTRAVENOUS | Status: DC

## 2020-01-02 NOTE — Anesthesia Procedure Notes (Signed)
Procedure Name: MAC Date/Time: 01/02/2020 2:41 PM Performed by: Mariea Clonts, CRNA Pre-anesthesia Checklist: Patient identified, Emergency Drugs available, Suction available, Patient being monitored and Timeout performed Patient Re-evaluated:Patient Re-evaluated prior to induction Oxygen Delivery Method: Nasal cannula and Simple face mask

## 2020-01-02 NOTE — Anesthesia Preprocedure Evaluation (Addendum)
Anesthesia Evaluation  Patient identified by MRN, date of birth, ID band Patient awake    Reviewed: Allergy & Precautions, NPO status , Patient's Chart, lab work & pertinent test results  History of Anesthesia Complications (+) PONV and history of anesthetic complications  Airway Mallampati: I  TM Distance: >3 FB Neck ROM: Full    Dental no notable dental hx. (+) Teeth Intact, Dental Advisory Given   Pulmonary pneumonia, former smoker,  Former smoker, quit 1985  MSSA bacteremia/acute hypoxia/CAP: Received ceftriaxone and azithromycin on admission.  Blood culture growing MSSA therefore antibiotics changed to cefazolin.     Pulmonary exam normal breath sounds clear to auscultation       Cardiovascular hypertension, Pt. on medications +CHF  Normal cardiovascular exam Rhythm:Regular Rate:Normal  Echo 12/30/19: 1. Left ventricular ejection fraction, by estimation, is 45 to 50%. The left ventricle has mildly decreased function. The left ventricle demonstrates global hypokinesis. Left ventricular diastolic parameters are  consistent with Grade II diastolic  dysfunction (pseudonormalization).  2. Right ventricular systolic function is normal. The right ventricular size is normal. There is normal pulmonary artery systolic pressure.  3. Left atrial size was mildly dilated.  4. The mitral valve is normal in structure and function. Trivial mitral valve regurgitation.  5. The aortic valve is normal in structure and function. Aortic valve regurgitation is not visualized.  6. The inferior vena cava is normal in size with greater than 50% respiratory variability, suggesting right atrial pressure of 3 mmHg.    Neuro/Psych PSYCHIATRIC DISORDERS Depression CVA    GI/Hepatic negative GI ROS, Neg liver ROS,   Endo/Other  diabetes, Well Controlled, Type 2, Insulin DependentLast a1c 6.5  Renal/GU ESRFRenal diseaseLast HD 3/7  negative  genitourinary   Musculoskeletal negative musculoskeletal ROS (+)   Abdominal   Peds  Hematology  (+) Blood dyscrasia, anemia , hct 31.4   Anesthesia Other Findings HLD  Reproductive/Obstetrics negative OB ROS                            Anesthesia Physical  Anesthesia Plan  ASA: III  Anesthesia Plan: MAC   Post-op Pain Management:    Induction:   PONV Risk Score and Plan: 3 and Treatment may vary due to age or medical condition, Propofol infusion and TIVA  Airway Management Planned: Nasal Cannula and Natural Airway  Additional Equipment: None  Intra-op Plan:   Post-operative Plan:   Informed Consent: I have reviewed the patients History and Physical, chart, labs and discussed the procedure including the risks, benefits and alternatives for the proposed anesthesia with the patient or authorized representative who has indicated his/her understanding and acceptance.     Dental advisory given  Plan Discussed with: CRNA  Anesthesia Plan Comments:         Anesthesia Quick Evaluation

## 2020-01-02 NOTE — Interval H&P Note (Signed)
History and Physical Interval Note:  01/02/2020 2:38 PM  Melissa Howe  has presented today for surgery, with the diagnosis of stroke.  The various methods of treatment have been discussed with the patient and family. After consideration of risks, benefits and other options for treatment, the patient has consented to  Procedure(s): TRANSESOPHAGEAL ECHOCARDIOGRAM (TEE) (N/A) as a surgical intervention.  The patient's history has been reviewed, patient examined, no change in status, stable for surgery.  I have reviewed the patient's chart and labs.  Questions were answered to the patient's satisfaction.     Elouise Munroe

## 2020-01-02 NOTE — Anesthesia Postprocedure Evaluation (Signed)
Anesthesia Post Note  Patient: Melissa Howe  Procedure(s) Performed: TRANSESOPHAGEAL ECHOCARDIOGRAM (TEE) (N/A ) BUBBLE STUDY     Patient location during evaluation: Endoscopy Anesthesia Type: MAC Level of consciousness: awake and alert Pain management: pain level controlled Vital Signs Assessment: post-procedure vital signs reviewed and stable Respiratory status: spontaneous breathing, nonlabored ventilation, respiratory function stable and patient connected to nasal cannula oxygen Cardiovascular status: blood pressure returned to baseline and stable Postop Assessment: no apparent nausea or vomiting Anesthetic complications: no    Last Vitals:  Vitals:   01/02/20 1516 01/02/20 1525  BP: (!) 143/48 140/60  Pulse: 76 77  Resp: (!) 24 (!) 22  Temp: (!) 35.4 C   SpO2: 100% 100%    Last Pain:  Vitals:   01/02/20 1525  TempSrc:   PainSc: 2                  Barnet Glasgow

## 2020-01-02 NOTE — Progress Notes (Signed)
[facility]    Date of Admission:  12/29/2019   Total days of antibiotics 5           ID: Massiel Stipp is a 69 y.o. female with  MSSA bacteremia  Principal Problem:   MSSA bacteremia Active Problems:   Essential hypertension   Uncontrolled type 2 diabetes mellitus with hyperglycemia, with long-term current use of insulin (HCC)   CAP (community acquired pneumonia)   Chronic diastolic CHF (congestive heart failure) (HCC)   Acute respiratory failure with hypoxemia (HCC)   End stage renal disease on dialysis ([place])    Subjective: Afebrile, feeling better than admission. Has upcoming TEE this afternoon.  Addendum; TEE no vegetation Medications:  . amLODipine  10 mg Oral Daily  . calcitRIOL  0.25 mcg Oral Q T,Th,Sa-HD  . carvedilol  6.25 mg Oral BID WC  . Chlorhexidine Gluconate Cloth  6 each Topical Q0600  . Chlorhexidine Gluconate Cloth  6 each Topical Q0600  . [START ON 01/03/2020] Chlorhexidine Gluconate Cloth  6 each Topical Q0600  . ferric citrate  420 mg Oral TID WC  . heparin  5,000 Units Subcutaneous Q8H  . insulin aspart  0-15 Units Subcutaneous TID WC  . insulin aspart  0-5 Units Subcutaneous QHS  . insulin detemir  8 Units Subcutaneous QHS  . psyllium  1 packet Oral Daily  . senna-docusate  1 tablet Oral BID    Objective: Vital signs in last 24 hours: Temp:  [95.7 F (35.4 C)-98.7 F (37.1 C)] 97.4 F (36.3 C) (03/08 1700) Pulse Rate:  [64-77] 74 (03/08 1622) Resp:  [12-24] 12 (03/08 1622) BP: (140-217)/(48-80) 176/80 (03/08 1622) SpO2:  [99 %-100 %] 100 % (03/08 1622) Weight:  [66.7 kg] 66.7 kg (03/08 1339)  Physical Exam  Constitutional:  oriented to person, place, and time. appears well-developed and well-nourished. No distress.  HENT: [place]/AT, PERRLA, no scleral icterus Mouth/Throat: Oropharynx is clear and moist. No oropharyngeal exudate.  Cardiovascular: Normal rate, regular rhythm and normal heart sounds. Exam reveals no  gallop and no friction rub.  No murmur heard.  Pulmonary/Chest: Effort normal and breath sounds normal. No respiratory distress.  has no wheezes.  Neck = supple, no nuchal rigidity Abdominal: Soft. Bowel sounds are normal.  exhibits no distension. There is no tenderness.  Ext: no tenderness or warmth to left AVG Neurological: alert and oriented to person, place, and time.  Skin: Skin is warm and dry. No rash noted. No erythema.  Psychiatric: a normal mood and affect.  behavior is normal.    Lab Results Recent Labs    12/31/19 0720 01/02/20 0355  WBC 11.5* 8.6  HGB 9.2* 9.8*  HCT 29.5* 31.4*  NA 135 135  K 3.6 3.5  CL 97* 96*  CO2 23 26  BUN 45* 39*  CREATININE 6.06* 6.07*   Liver Panel Recent Labs    12/31/19 0720 01/02/20 0355  ALBUMIN 2.4* 2.5*    Microbiology: 3/6 blood cx ngtd 3/4 blood cx MSSA Studies/Results: MR THORACIC SPINE WO CONTRAST  Result Date: 01/01/2020 CLINICAL DATA:  Initial evaluation for suspected endocarditis, bacteremia, evaluate for possible discitis. EXAM: MRI THORACIC AND LUMBAR SPINE WITHOUT CONTRAST TECHNIQUE: Multiplanar and multiecho pulse sequences of the thoracic and lumbar spine were obtained without intravenous contrast. COMPARISON:  None. FINDINGS: MRI THORACIC SPINE FINDINGS Alignment: Vertebral bodies normally aligned with preservation of the normal thoracic kyphosis. No listhesis. Vertebrae: Mild chronic height loss noted at the superior endplate  of T12. Vertebral body height otherwise maintained. Bone marrow signal intensity diffusely decreased on T1 weighted imaging, likely related to patient's history of end-stage renal disease. Multiple scattered benign hemangiomata noted within the lower thoracic spine. No worrisome osseous lesions. No MRI findings to suggest acute osteomyelitis or discitis. No definite evidence for septic arthritis. Cord: Signal intensity within the thoracic spinal cord is normal. No epidural abscess or other  collection. Paraspinal and other soft tissues: Paraspinous soft tissues demonstrate no acute finding. Partially visualized lungs are grossly clear. Visualized visceral structures unremarkable. Disc levels: Mild multilevel degenerative disc bulging noted at T3-4 through T11-12. No high-grade spinal stenosis or frank cord impingement. Discogenic reactive endplate changes present at T8-9 through T11-12. Foramina remain patent. MRI LUMBAR SPINE FINDINGS Segmentation: Standard. Lowest well-formed disc space labeled the L5-S1 level. Alignment: Physiologic with preservation of the normal lumbar lordosis. No listhesis. Vertebrae: Vertebral body height maintained without evidence for acute or chronic fracture. Bone marrow signal intensity heterogeneous and diffusely decreased on T1 weighted imaging, most likely related to history of end-stage renal disease. Few scattered benign hemangiomata noted. No worrisome osseous lesions. No abnormal marrow edema. No findings to suggest osteomyelitis discitis or septic arthritis. Conus medullaris and cauda equina: Conus extends to the L1 level. Conus and cauda equina appear normal. No epidural collections. Paraspinal and other soft tissues: Paraspinous soft tissues within normal limits. Visualized visceral structures are normal. Disc levels: L1-2:  Negative interspace.  Mild facet hypertrophy.  No stenosis. L2-3:  Negative interspace.  Mild facet hypertrophy.  No stenosis. L3-4: Mild circumferential disc bulge. Moderate facet and ligament flavum hypertrophy. Associated trace bilateral joint effusions. No significant canal or foraminal stenosis. L4-5: Mild diffuse disc bulge with disc desiccation. Superimposed small right foraminal to extraforaminal disc protrusion closely approximates the exiting right L4 nerve root (series 16, image 24). Mild to moderate facet hypertrophy. Resultant mild bilateral lateral recess stenosis. Mild bilateral L4 foraminal narrowing. L5-S1:  Negative  interspace.  Mild facet hypertrophy.  No stenosis. IMPRESSION: MR THORACIC SPINE IMPRESSION 1. No MRI evidence for osteomyelitis discitis or septic arthritis within the thoracic spine. 2. Mild multilevel degenerative disc disease throughout the mid and lower thoracic spine without significant stenosis or neural impingement. MR LUMBAR SPINE IMPRESSION 1. No MRI evidence for osteomyelitis discitis or septic arthritis within the lumbar spine. 2. Small right foraminal to extraforaminal disc protrusion at L4-5, closely approximating and potentially irritating the exiting right L4 nerve root. Electronically Signed   By: Jeannine Boga M.D.   On: 01/01/2020 19:56   MR LUMBAR SPINE WO CONTRAST  Result Date: 01/01/2020 CLINICAL DATA:  Initial evaluation for suspected endocarditis, bacteremia, evaluate for possible discitis. EXAM: MRI THORACIC AND LUMBAR SPINE WITHOUT CONTRAST TECHNIQUE: Multiplanar and multiecho pulse sequences of the thoracic and lumbar spine were obtained without intravenous contrast. COMPARISON:  None. FINDINGS: MRI THORACIC SPINE FINDINGS Alignment: Vertebral bodies normally aligned with preservation of the normal thoracic kyphosis. No listhesis. Vertebrae: Mild chronic height loss noted at the superior endplate of G25. Vertebral body height otherwise maintained. Bone marrow signal intensity diffusely decreased on T1 weighted imaging, likely related to patient's history of end-stage renal disease. Multiple scattered benign hemangiomata noted within the lower thoracic spine. No worrisome osseous lesions. No MRI findings to suggest acute osteomyelitis or discitis. No definite evidence for septic arthritis. Cord: Signal intensity within the thoracic spinal cord is normal. No epidural abscess or other collection. Paraspinal and other soft tissues: Paraspinous soft tissues demonstrate no acute finding. Partially  visualized lungs are grossly clear. Visualized visceral structures unremarkable. Disc  levels: Mild multilevel degenerative disc bulging noted at T3-4 through T11-12. No high-grade spinal stenosis or frank cord impingement. Discogenic reactive endplate changes present at T8-9 through T11-12. Foramina remain patent. MRI LUMBAR SPINE FINDINGS Segmentation: Standard. Lowest well-formed disc space labeled the L5-S1 level. Alignment: Physiologic with preservation of the normal lumbar lordosis. No listhesis. Vertebrae: Vertebral body height maintained without evidence for acute or chronic fracture. Bone marrow signal intensity heterogeneous and diffusely decreased on T1 weighted imaging, most likely related to history of end-stage renal disease. Few scattered benign hemangiomata noted. No worrisome osseous lesions. No abnormal marrow edema. No findings to suggest osteomyelitis discitis or septic arthritis. Conus medullaris and cauda equina: Conus extends to the L1 level. Conus and cauda equina appear normal. No epidural collections. Paraspinal and other soft tissues: Paraspinous soft tissues within normal limits. Visualized visceral structures are normal. Disc levels: L1-2:  Negative interspace.  Mild facet hypertrophy.  No stenosis. L2-3:  Negative interspace.  Mild facet hypertrophy.  No stenosis. L3-4: Mild circumferential disc bulge. Moderate facet and ligament flavum hypertrophy. Associated trace bilateral joint effusions. No significant canal or foraminal stenosis. L4-5: Mild diffuse disc bulge with disc desiccation. Superimposed small right foraminal to extraforaminal disc protrusion closely approximates the exiting right L4 nerve root (series 16, image 24). Mild to moderate facet hypertrophy. Resultant mild bilateral lateral recess stenosis. Mild bilateral L4 foraminal narrowing. L5-S1:  Negative interspace.  Mild facet hypertrophy.  No stenosis. IMPRESSION: MR THORACIC SPINE IMPRESSION 1. No MRI evidence for osteomyelitis discitis or septic arthritis within the thoracic spine. 2. Mild multilevel  degenerative disc disease throughout the mid and lower thoracic spine without significant stenosis or neural impingement. MR LUMBAR SPINE IMPRESSION 1. No MRI evidence for osteomyelitis discitis or septic arthritis within the lumbar spine. 2. Small right foraminal to extraforaminal disc protrusion at L4-5, closely approximating and potentially irritating the exiting right L4 nerve root. Electronically Signed   By: Jeannine Boga M.D.   On: 01/01/2020 19:56     Assessment/Plan: MSSA bacteremia = have ruled out endocarditis with TEE being negative. Plan to treat with cefazolin post HD x 14 days using 3/6 as day 1. Will discontinue rifampin since AVG does not appear to be involved. Recommend to repeat blood cx at completion of 2 wks.  ESRD = can do abtx post hd  Will see back in the id clinic in 3 wk   sign off.  [facility] Cell: [phone] Pager: [phone]  01/02/2020, 5:06 PM

## 2020-01-02 NOTE — Progress Notes (Signed)
   Subjective: Pt seen at the bedside this morning. States she is feeling well, denies pain, chest pain, or trouble breathing. Is looking forward to her procedure today so she can eat afterwards. No acute concerns at this time.  Objective:  Vital signs in last 24 hours: Vitals:   01/01/20 2255 01/02/20 0000 01/02/20 0300 01/02/20 0423  BP:  (!) 143/68 (!) 143/71   Pulse:  73  64  Resp:  (!) 21  (!) 21  Temp:  97.9 F (36.6 C) (!) 97.4 F (36.3 C) 97.7 F (36.5 C)  TempSrc:  Oral Axillary Oral  SpO2: 100% 99%    Weight:   66.7 kg   Height:       Physical Exam Vitals and nursing note reviewed.  Constitutional:      General: She is not in acute distress.    Appearance: She is not ill-appearing.  Cardiovascular:     Rate and Rhythm: Normal rate and regular rhythm.     Heart sounds: Murmur present.  Pulmonary:     Effort: Pulmonary effort is normal.  Abdominal:     General: Abdomen is flat. Bowel sounds are normal.     Palpations: Abdomen is soft.  Neurological:     Mental Status: She is alert.    Assessment/Plan:  Principal Problem:   MSSA bacteremia Active Problems:   Essential hypertension   Uncontrolled type 2 diabetes mellitus with hyperglycemia, with long-term current use of insulin (HCC)   CAP (community acquired pneumonia)   Chronic diastolic CHF (congestive heart failure) (HCC)   Acute respiratory failure with hypoxemia (HCC)   End stage renal disease on dialysis [facility])  Ms. Milberger is a 69 year old F with significant PMH of ESRD on TTS dialysis, type II diabetes, and HFpEF, who presented due to worsening shortness of breath and was found to be febrile to 103, hypoxic requiring BiPAP, tachypnic, and tachycardic all concerning for sepsis. Found to have MSSA growing in blood cultures on 3/4.  MSSA bacteremia Acute hypoxic respiratory failure Pt presented with fever, hypoxia, and shortness of breath concerning for volume overload superimposed on sepsis. Blood  cultures on admission on 3/4 positive for MSSA.  - pt on day 5 of antibiotics with cefazolin and rifampin - afebrile >24hrs - leukocytosis resolved 8.6 << 11.5  - blood cultures from 3/6 with NGTD - MRI of T and L spine negative for discitis - seen by Dr. Carlis Abbott of [facility] who saw no evidence for the forearm graft being pt's infectious source - pt to get TEE today to evaluate for endocarditis - will need at least 14 day course of antibiotics, to be complete outpatient  ESRD on TTS HD Began HD on 12/15/2019, using LUE AVG for access Nephrology consulted, appreciate recommendations. - next HD session tomorrow - no iron for pt's anemia given bacteremia  T2DM - well controlled Pt on 18units Levemir at home. A1c on admission 6.5. - glc this morning 91 - continue 8 units levemir daily - moderate + qhs SSI  HTN - continue home carvedilol 6.25mg  BID - restart pt's home amlodipine 10mg  daily this evening after procedure  Prior to Admission Living Arrangement: home Anticipated Discharge Location: home Barriers to Discharge: completing medical work-up and outpatient antibiotic regimen Dispo: Anticipated discharge in approximately 0-1 day(s).   Ladona Horns, MD 01/02/2020, 6:12 AM Pager: [phone]

## 2020-01-02 NOTE — Progress Notes (Signed)
Patient returned form MRI, no distress, VSS- reviewed plan of care for pending TEE, verbalized understanding. Patient reminded of NPO status.IV ABT continues via peripheral access, line intact and patent. AV fistula also intact and patent .

## 2020-01-02 NOTE — Transfer of Care (Signed)
Immediate Anesthesia Transfer of Care Note  Patient: Melissa Howe  Procedure(s) Performed: TRANSESOPHAGEAL ECHOCARDIOGRAM (TEE) (N/A ) BUBBLE STUDY  Patient Location: Endoscopy Unit  Anesthesia Type:MAC  Level of Consciousness: awake, alert  and oriented  Airway & Oxygen Therapy: Patient Spontanous Breathing and Patient connected to nasal cannula oxygen  Post-op Assessment: Report given to RN, Post -op Vital signs reviewed and stable and Patient moving all extremities X 4  Post vital signs: Reviewed and stable  Last Vitals:  Vitals Value Taken Time  BP 143/48 01/02/20 1516  Temp 35.4 C 01/02/20 1516  Pulse 77 01/02/20 1518  Resp 15 01/02/20 1518  SpO2 100 % 01/02/20 1518  Vitals shown include unvalidated device data.  Last Pain:  Vitals:   01/02/20 1516  TempSrc: Temporal  PainSc: 0-No pain         Complications: No apparent anesthesia complications

## 2020-01-02 NOTE — Progress Notes (Signed)
[facility] Progress Note  Subjective: no new c/o's, in good spirits  Vitals:   01/02/20 0803 01/02/20 0804 01/02/20 0837 01/02/20 1232  BP: (!) 175/74  (!) 181/77 (!) 177/76  Pulse:   69   Resp:   19   Temp:  97.6 F (36.4 C)  97.7 F (36.5 C)  TempSrc:  Oral  Oral  SpO2:   100%   Weight:      Height:        Exam: General: Sitting on bed and eating breakfast, not in distress Heart:RRR, s1s2 nl Lungs: Clear bilateral, no increased work of breathing Abdomen:soft, Non-tender, non-distended Extremities:No edema Dialysis Access: LUA AVG+ bruit.  No erythema, tenderness.    Dialysis: [facility] TTS   4h  400/800  2/2 bath  69kg  LUE AVG  Hep 2200 Mircera 75 mcg IV q 2 weeks- last dose 130mcg on 3/2 Venfoer 100 mg IV q HD x 10 doses- received 5 so far Calcitriol 0.25 mcg PO TID with HD   Assessment/ Plan: #MSSA bacteremia/acute hypoxia/CAP: Received ceftriaxone and azithromycin on admission.  Blood culture growing MSSA therefore antibiotics changed to cefazolin.  Echo with no vegetation.  AVG is not grossly infected.  ID is following.  Plan is for TEE and MRI of his spine.  Seen by vascular as well.  # ESRD - HD TTS. Plan for next HD tomorrow.   # Anemia of ESRD: Received Mircera on 3/2.  No iron because of bacteremia.  Monitor hemoglobin.  # Secondary hyperparathyroidism: Phosphorus level at goal, started [place] on 3/5.  Resume calcitriol.  # HTN/volume: Continue to monitor.  BP acceptable.  3kg under dry wt, no vol ^ on exam.      Melissa Howe 01/02/2020, 1:02 PM   Recent Labs  Lab 12/31/19 0720 01/02/20 0355  K 3.6 3.5  BUN 45* 39*  CREATININE 6.06* 6.07*  CALCIUM 8.4* 8.9  PHOS 3.7 3.9  HGB 9.2* 9.8*   Inpatient medications: . calcitRIOL  0.25 mcg Oral Q T,Th,Sa-HD  . carvedilol  6.25 mg Oral BID WC  . Chlorhexidine Gluconate Cloth  6 each Topical Q0600  . Chlorhexidine Gluconate Cloth  6 each Topical Q0600  . ferric citrate  420 mg Oral  TID WC  . heparin  5,000 Units Subcutaneous Q8H  . insulin aspart  0-15 Units Subcutaneous TID WC  . insulin aspart  0-5 Units Subcutaneous QHS  . insulin detemir  8 Units Subcutaneous QHS  . psyllium  1 packet Oral Daily  . rifampin  300 mg Oral BID WC  . senna-docusate  1 tablet Oral BID   .  ceFAZolin (ANCEF) IV 1 g (01/01/20 2338)   acetaminophen **OR** acetaminophen, ondansetron

## 2020-01-02 NOTE — CV Procedure (Signed)
INDICATIONS: infective endocarditis  PROCEDURE:   Informed consent was obtained prior to the procedure. The risks, benefits and alternatives for the procedure were discussed and the patient comprehended these risks.  Risks include, but are not limited to, cough, sore throat, vomiting, nausea, somnolence, esophageal and stomach trauma or perforation, bleeding, low blood pressure, aspiration, pneumonia, infection, trauma to the teeth and death.    After a procedural time-out, the oropharynx was anesthetized with 20% benzocaine spray.   During this procedure the patient was administered deep sedation per anesthesia.  The patient's heart rate, blood pressure, and oxygen saturationweare monitored continuously during the procedure. The period of conscious sedation was 30 minutes, of which I was present face-to-face 100% of this time.  The transesophageal probe was inserted in the esophagus and stomach without difficulty and multiple views were obtained.  The patient was kept under observation until the patient left the procedure room.  The patient left the procedure room in stable condition.   Agitated microbubble saline contrast was administered.  COMPLICATIONS:    There were no immediate complications.  FINDINGS:   No valvular vegetations Mild MR No PFO  RECOMMENDATIONS:     ok to return to hospital room when alert  Time Spent Directly with the Patient:  60 minutes   Elouise Munroe 01/02/2020, 3:28 PM

## 2020-01-03 ENCOUNTER — Encounter: Payer: Self-pay | Admitting: *Deleted

## 2020-01-03 DIAGNOSIS — R7881 Bacteremia: Secondary | ICD-10-CM | POA: Insufficient documentation

## 2020-01-03 LAB — COMPREHENSIVE METABOLIC PANEL
ALT: 9 U/L (ref 0–44)
AST: 26 U/L (ref 15–41)
Albumin: 2.5 g/dL — ABNORMAL LOW (ref 3.5–5.0)
Alkaline Phosphatase: 119 U/L (ref 38–126)
Anion gap: 13 (ref 5–15)
BUN: 47 mg/dL — ABNORMAL HIGH (ref 8–23)
CO2: 23 mmol/L (ref 22–32)
Calcium: 8.5 mg/dL — ABNORMAL LOW (ref 8.9–10.3)
Chloride: 97 mmol/L — ABNORMAL LOW (ref 98–111)
Creatinine, Ser: 6.78 mg/dL — ABNORMAL HIGH (ref 0.44–1.00)
GFR calc Af Amer: 7 mL/min — ABNORMAL LOW (ref >60)
GFR calc non Af Amer: 6 mL/min — ABNORMAL LOW (ref >60)
Glucose, Bld: 247 mg/dL — ABNORMAL HIGH (ref 70–99)
Potassium: 3.7 mmol/L (ref 3.5–5.1)
Sodium: 133 mmol/L — ABNORMAL LOW (ref 135–145)
Total Bilirubin: 0.6 mg/dL (ref 0.3–1.2)
Total Protein: 6.3 g/dL — ABNORMAL LOW (ref 6.5–8.1)

## 2020-01-03 LAB — GLUCOSE, CAPILLARY: Glucose-Capillary: 174 mg/dL — ABNORMAL HIGH (ref 70–99)

## 2020-01-03 MED ORDER — CEFAZOLIN SODIUM-DEXTROSE 2-4 GM/100ML-% IV SOLN: 2.0000 g | INTRAVENOUS | 0 refills | Status: AC

## 2020-01-03 NOTE — Progress Notes (Signed)
Patient will NOT have inpt HD prior to discharge. Her daughter is on the way to pick her up to take her to her OP HD clinic/[facility] for HD treatment after discharge today. Patient, MD and RN aware. Renal PA notified to send HD orders to clinic.  Patient cleared for discharge from a Renal standpoint.  Alphonzo Cruise, [place] [facility]al Navigator [phone]

## 2020-01-03 NOTE — Progress Notes (Signed)
Subjective: Pt seen at the bedside this morning. Awake, alert, and feeling well. Is interested in going home today. No acute concerns. Pt aware that TEE did not show evidence of endocarditis.   Instructed her that she will continue receiving outpatient IV antibiotics at dialysis for 2 weeks, will get repeat blood work done at that time, and should follow-up with ID in 3 weeks. Pt expressed understanding and stated her daughter would be able to transport the pt to her outpatient dialysis chair time around noon today.  Objective:  Vital signs in last 24 hours: Vitals:   01/02/20 1700 01/02/20 2000 01/02/20 2339 01/03/20 0422  BP:  (!) 168/86 (!) 142/67 (!) 161/70  Pulse:  80 76 73  Resp:  (!) 23 16 16   Temp: (!) 97.4 F (36.3 C) 98.4 F (36.9 C) 98.4 F (36.9 C) 98.8 F (37.1 C)  TempSrc: Axillary Oral Oral Oral  SpO2:  100% 99% 100%  Weight:      Height:       Physical Exam Vitals and nursing note reviewed.  Constitutional:      General: She is not in acute distress.    Appearance: She is not ill-appearing.     Comments: Pt awake in bed, pleasant.  Pulmonary:     Effort: Pulmonary effort is normal. No respiratory distress.     Comments: On room air, O2 saturation >98%. Musculoskeletal:     Comments: Thrill over LUE AVG.  Neurological:     Mental Status: She is alert.    Assessment/Plan:  Principal Problem:   MSSA bacteremia Active Problems:   Essential hypertension   Uncontrolled type 2 diabetes mellitus with hyperglycemia, with long-term current use of insulin (HCC)   CAP (community acquired pneumonia)   Chronic diastolic CHF (congestive heart failure) (HCC)   Acute respiratory failure with hypoxemia (HCC)   End stage renal disease on dialysis [facility])  [name] is a 69 year old F with significant PMH of ESRD on TTS dialysis, type II diabetes, and HFpEF, who presented due to worsening shortness of breath and was found to be febrile to 103, hypoxic requiring BiPAP,  tachypnic, and tachycardic all concerning for sepsis. Found tohave MSSA growing in blood cultures on 3/4.  MSSA bacteremia Acute hypoxic respiratory failure - resolved Pt remains afebrile and without leukocytosis. Currently on cefazolin and rifampin. Seen by Dr. Carlis Abbott of [facility] on 3/6 who saw no evidence for the forearm graft being pt's infectious source. MRI of T and L spine negative for discitis on 3/7. - blood cultures from 3/6 with NGTD - TEE on 3/8 without vegetations, mild MR, and no PFO  Infectious disease consulted, appreciate recommendations - discontinue rifampin given no evidence of graft involvement - continue cefazolin 2g IV, to be given after outpatient dialysis on TTS - total 14 day course from 3/6 to 3/20 - repeat blood cultures on 3/20 - ID outpatient follow-up in 3 weeks  ESRD on TTS HD Began HD on 12/15/2019, using LUE AVG for access Nephrology consulted, appreciate recommendations. - complete HD inpatient today and resume regular outpatient schedule at discharge - no iron for pt's anemia given due to bacteremia - pt noted to be 3kg under dry wt on 3/8, suspect pt losing muscle mass and will need continued challenging of her true dry weight  HTN - continue home carvedilol 6.25mg  BID and amlodipine 10mg  daily   Prior to Admission Living Arrangement: home Anticipated Discharge Location: home Barriers to Discharge: cefazolin to be  given at outpatient dialysis today Dispo: Anticipated discharge today.  Ladona Horns, MD 01/03/2020, 6:19 AM Pager: [phone]

## 2020-01-03 NOTE — Discharge Summary (Signed)
Name: Melissa Howe MRN: [id number] DOB: 01-02-1951 69 y.o. PCP: Sonia Side., FNP  Date of Admission: 12/29/2019  7:47 AM Date of Discharge: 01/03/2020 Attending Physician: Lucious Groves, DO  Discharge Diagnosis: Principal Problem:   MSSA bacteremia Active Problems:   Essential hypertension   Uncontrolled type 2 diabetes mellitus with hyperglycemia, with long-term current use of insulin ([place])   CAP (community acquired pneumonia)   Chronic diastolic CHF (congestive heart failure) ([place])   Acute respiratory failure with hypoxemia ([place])   End stage renal disease on dialysis [facility])  Discharge Medications: Allergies as of 01/03/2020   No Known Allergies     Medication List    TAKE these medications   acetaminophen 500 MG tablet Commonly known as: TYLENOL Take 500 mg by mouth every 6 (six) hours as needed for moderate pain or headache.   amLODipine 10 MG tablet Commonly known as: NORVASC Take 1 tablet (10 mg total) by mouth daily.   aspirin EC 81 MG tablet Take 1 tablet (81 mg total) by mouth daily.   carvedilol 12.5 MG tablet Commonly known as: COREG Take 0.5 tablets (6.25 mg total) by mouth 2 (two) times daily with a meal.   ceFAZolin 2-4 GM/100ML-% IVPB Commonly known as: ANCEF Inject 100 mLs (2 g total) into the vein every Tuesday, Thursday, and Saturday at 6 PM for 11 days.   ferrous sulfate 325 (65 FE) MG tablet Take 325 mg by mouth daily.   HYDROcodone-acetaminophen 5-325 MG tablet Commonly known as: NORCO/VICODIN Take 1 tablet by mouth every 6 (six) hours as needed for moderate pain.   insulin detemir 100 UNIT/ML FlexPen Commonly known as: LEVEMIR Inject 18 Units into the skin daily with breakfast.   Insulin Pen Needle 32G X 4 MM Misc Use with insulin pen to dispense insulin as directed   multivitamin Tabs tablet Take 1 tablet by mouth daily.   pentoxifylline 400 MG CR tablet Commonly known as: TRENTAL Take 1 tablet (400 mg total) by mouth 3  (three) times daily with meals.   rosuvastatin 20 MG tablet Commonly known as: CRESTOR Take 1 tablet (20 mg total) by mouth daily. What changed: Another medication with the same name was removed. Continue taking this medication, and follow the directions you see here.       Disposition and follow-up:   Ms.Casie Evans Rinn was discharged from [facility] in Good condition.  At the hospital follow up visit please address:  1.  MSSA bacteremia - complete course of 2g IV cefazolin after outpatient dialysis until 3/20 - repeat blood cultures around antibiotic regimen - ID outpatient follow-up in 3 weeks  ESRD on HD TTS - pt initially volume up on presentation - on 3/8 pt noted to be 3kg below EDW - suspect pt losing muscle mass, continue to challenge outpatient dry weight   2.  Labs / imaging needed at time of follow-up: repeat blood cultures to be taken on March 20th  3.  Pending labs/ test needing follow-up: follow-up final blood cultures from 12/31/2019   Follow-up Appointments: Follow-up [facility] for Infectious Disease. Call.   Specialty: Infectious Diseases Why: Schedule a hospital follow-up with the infectious disease clinic sometime during the last week of the month. Contact information: [place], [place] [id number] [place] Course by problem list: 1. MSSA Bacteremia 69yo female w/PMH  of newly progressed to ESRD on TThSa with left arm graft, TIIDM, HFpEF who was admitted with shortness of breath and hypoxia requiring Bipap. Chest x-ray showed volume overload with possible pnuemonia. She was started on ceftraixone and azithromycin for likely CAP and received HD for volume overload with improvement in respiratory symptoms and return to room air. Blood cultures returned positive for MSSA in 3/4 and she was narrowed to cefazolin with dialysis. No source  for the bacteremia was found as patient's graft did not show signs and symptoms of infection. Vascular was consulted to evaluate graft as well. Infectious Disease was consulted and added rifampin due to graft. TTE and TEE were performed which showed no vegetation or other acute finding. Thoracic and lumbar MRI were done which did not show signs of discitis or other infection. She will continue cefazolin for 14 days total with dialysis and discontinuation of rifampin and will need repeat blood cultures 3/20.    Discharge Vitals:   BP (!) 161/70 (BP Location: Right Arm)    Pulse 73    Temp 98.8 F (37.1 C) (Oral)    Resp 16    Ht 5\' 4"  (1.626 m)    Wt 66.7 kg    SpO2 100%    BMI 25.24 kg/m   Pertinent Labs, Studies, and Procedures:  CBC Latest Ref Rng & Units 01/02/2020 12/31/2019 12/30/2019  WBC 4.0 - 10.5 K/uL 8.6 11.5(H) 9.8  Hemoglobin 12.0 - 15.0 g/dL 9.8(L) 9.2(L) 9.2(L)  Hematocrit 36.0 - 46.0 % 31.4(L) 29.5(L) 29.5(L)  Platelets 150 - 400 K/uL 286 235 220   CMP Latest Ref Rng & Units 01/03/2020 01/02/2020 12/31/2019  Glucose 70 - 99 mg/dL 247(H) 168(H) 89  BUN 8 - 23 mg/dL 47(H) 39(H) 45(H)  Creatinine 0.44 - 1.00 mg/dL 6.78(H) 6.07(H) 6.06(H)  Sodium 135 - 145 mmol/L 133(L) 135 135  Potassium 3.5 - 5.1 mmol/L 3.7 3.5 3.6  Chloride 98 - 111 mmol/L 97(L) 96(L) 97(L)  CO2 22 - 32 mmol/L 23 26 23   Calcium 8.9 - 10.3 mg/dL 8.5(L) 8.9 8.4(L)  Total Protein 6.5 - 8.1 g/dL 6.3(L) - -  Total Bilirubin 0.3 - 1.2 mg/dL 0.6 - -  Alkaline Phos 38 - 126 U/L 119 - -  AST 15 - 41 U/L 26 - -  ALT 0 - 44 U/L 9 - -   Lab Results  Component Value Date   HGBA1C 6.5 (H) 12/30/2019   Recent Results (from the past 240 hour(s))  Culture, blood (routine x 2)     Status: Abnormal   Collection Time: 12/29/19  8:18 AM   Specimen: BLOOD RIGHT HAND  Result Value Ref Range Status   Specimen Description BLOOD RIGHT HAND  Final   Special Requests   Final    BOTTLES DRAWN AEROBIC ONLY Blood Culture results may not  be optimal due to an inadequate volume of blood received in culture bottles   Culture  Setup Time   Final    AEROBIC BOTTLE ONLY GRAM POSITIVE COCCI IN CLUSTERS CRITICAL RESULT CALLED TO, READ BACK BY AND VERIFIED WITH: Diona Browner PHARMD 12/29/19 2241 JDW Performed at [facility], [place] [address], [place], [place] [address]    Culture STAPHYLOCOCCUS AUREUS (A)  Final   Report Status 12/31/2019 FINAL  Final   Organism ID, Bacteria STAPHYLOCOCCUS AUREUS  Final      Susceptibility   Staphylococcus aureus - MIC*    CIPROFLOXACIN <=0.5 SENSITIVE Sensitive     ERYTHROMYCIN >=8  RESISTANT Resistant     GENTAMICIN <=0.5 SENSITIVE Sensitive     OXACILLIN 0.5 SENSITIVE Sensitive     TETRACYCLINE <=1 SENSITIVE Sensitive     VANCOMYCIN 1 SENSITIVE Sensitive     TRIMETH/SULFA <=10 SENSITIVE Sensitive     CLINDAMYCIN <=0.25 SENSITIVE Sensitive     RIFAMPIN <=0.5 SENSITIVE Sensitive     Inducible Clindamycin NEGATIVE Sensitive     * STAPHYLOCOCCUS AUREUS  Culture, blood (routine x 2)     Status: Abnormal   Collection Time: 12/29/19  8:25 AM   Specimen: BLOOD RIGHT HAND  Result Value Ref Range Status   Specimen Description BLOOD RIGHT HAND  Final   Special Requests   Final    BOTTLES DRAWN AEROBIC ONLY Blood Culture results may not be optimal due to an inadequate volume of blood received in culture bottles   Culture  Setup Time   Final    AEROBIC BOTTLE ONLY GRAM POSITIVE COCCI IN CLUSTERS CRITICAL RESULT CALLED TO, READ BACK BY AND VERIFIED WITH: V BRYK PHARMD 12/30/19 0102 JDW    Culture (A)  Final    STAPHYLOCOCCUS AUREUS SUSCEPTIBILITIES PERFORMED ON PREVIOUS CULTURE WITHIN THE LAST 5 DAYS. Performed at [facility], [place] [address], [place], [place] [address]    Report Status 12/31/2019 FINAL  Final  Blood Culture ID Panel (Reflexed)     Status: Abnormal   Collection Time: 12/29/19  8:25 AM  Result Value Ref Range Status   Enterococcus species NOT DETECTED NOT DETECTED Final    Listeria monocytogenes NOT DETECTED NOT DETECTED Final   Staphylococcus species DETECTED (A) NOT DETECTED Final    Comment: CRITICAL RESULT CALLED TO, READ BACK BY AND VERIFIED WITH: V BRYK PHARMD 12/30/19 0102 JDW    Staphylococcus aureus (BCID) DETECTED (A) NOT DETECTED Final    Comment: Methicillin (oxacillin) susceptible Staphylococcus aureus (MSSA). Preferred therapy is anti staphylococcal beta lactam antibiotic (Cefazolin or Nafcillin), unless clinically contraindicated. CRITICAL RESULT CALLED TO, READ BACK BY AND VERIFIED WITH: V BRYK PHARMD 12/30/19 0102 JDW    Methicillin resistance NOT DETECTED NOT DETECTED Final   Streptococcus species NOT DETECTED NOT DETECTED Final   Streptococcus agalactiae NOT DETECTED NOT DETECTED Final   Streptococcus pneumoniae NOT DETECTED NOT DETECTED Final   Streptococcus pyogenes NOT DETECTED NOT DETECTED Final   Acinetobacter baumannii NOT DETECTED NOT DETECTED Final   Enterobacteriaceae species NOT DETECTED NOT DETECTED Final   Enterobacter cloacae complex NOT DETECTED NOT DETECTED Final   Escherichia coli NOT DETECTED NOT DETECTED Final   Klebsiella oxytoca NOT DETECTED NOT DETECTED Final   Klebsiella pneumoniae NOT DETECTED NOT DETECTED Final   Proteus species NOT DETECTED NOT DETECTED Final   Serratia marcescens NOT DETECTED NOT DETECTED Final   Haemophilus influenzae NOT DETECTED NOT DETECTED Final   Neisseria meningitidis NOT DETECTED NOT DETECTED Final   Pseudomonas aeruginosa NOT DETECTED NOT DETECTED Final   Candida albicans NOT DETECTED NOT DETECTED Final   Candida glabrata NOT DETECTED NOT DETECTED Final   Candida krusei NOT DETECTED NOT DETECTED Final   Candida parapsilosis NOT DETECTED NOT DETECTED Final   Candida tropicalis NOT DETECTED NOT DETECTED Final    Comment: Performed at [facility], [place]. [address] [place],  [address]  Respiratory Panel by RT PCR (Flu A&B, Covid) - Nasopharyngeal Swab     Status: None    Collection Time: 12/29/19  9:23 AM   Specimen: Nasopharyngeal Swab  Result Value Ref Range Status   SARS  Coronavirus 2 by RT PCR NEGATIVE NEGATIVE Final    Comment: (NOTE) SARS-CoV-2 target nucleic acids are NOT DETECTED. The SARS-CoV-2 RNA is generally detectable in upper respiratoy specimens during the acute phase of infection. The lowest concentration of SARS-CoV-2 viral copies this assay can detect is 131 copies/mL. A negative result does not preclude SARS-Cov-2 infection and should not be used as the sole basis for treatment or other patient management decisions. A negative result may occur with  improper specimen collection/handling, submission of specimen other than nasopharyngeal swab, presence of viral mutation(s) within the areas targeted by this assay, and inadequate number of viral copies (<131 copies/mL). A negative result must be combined with clinical observations, patient history, and epidemiological information. The expected result is Negative. Fact Sheet for Patients:  PinkCheek.be Fact Sheet for Healthcare Providers:  GravelBags.it This test is not yet ap proved or cleared by the [place] FDA and  has been authorized for detection and/or diagnosis of SARS-CoV-2 by FDA under an Emergency Use Authorization (EUA). This EUA will remain  in effect (meaning this test can be used) for the duration of the COVID-19 declaration under Section 564(b)(1) of the Act, 21 U.S.C. section 360bbb-3(b)(1), unless the authorization is terminated or revoked sooner.    Influenza A by PCR NEGATIVE NEGATIVE Final   Influenza B by PCR NEGATIVE NEGATIVE Final    Comment: (NOTE) The Xpert Xpress SARS-CoV-2/FLU/RSV assay is intended as an aid in  the diagnosis of influenza from Nasopharyngeal swab specimens and  should not be used as a sole basis for treatment. Nasal washings and  aspirates are unacceptable for Xpert Xpress  SARS-CoV-2/FLU/RSV  testing. Fact Sheet for Patients: PinkCheek.be Fact Sheet for Healthcare Providers: GravelBags.it This test is not yet approved or cleared by the [place] FDA and  has been authorized for detection and/or diagnosis of SARS-CoV-2 by  FDA under an Emergency Use Authorization (EUA). This EUA will remain  in effect (meaning this test can be used) for the duration of the  Covid-19 declaration under Section 564(b)(1) of the Act, 21  U.S.C. section 360bbb-3(b)(1), unless the authorization is  terminated or revoked. Performed at [facility], [place] [address] [place], [place] [address]   Blood culture (routine x 2)     Status: None (Preliminary result)   Collection Time: 12/29/19 11:21 PM   Specimen: BLOOD  Result Value Ref Range Status   Specimen Description BLOOD RIGHT WRIST  Final   Special Requests   Final    BOTTLES DRAWN AEROBIC AND ANAEROBIC Blood Culture adequate volume   Culture   Final    NO GROWTH 3 DAYS Performed at [facility], [place] [address], [place], [place] [address]    Report Status PENDING  Incomplete  MRSA PCR Screening     Status: None   Collection Time: 12/29/19 11:42 PM   Specimen: Nasopharyngeal  Result Value Ref Range Status   MRSA by PCR NEGATIVE NEGATIVE Final    Comment:        The GeneXpert MRSA Assay (FDA approved for NASAL specimens only), is one component of a comprehensive MRSA colonization surveillance program. It is not intended to diagnose MRSA infection nor to guide or monitor treatment for MRSA infections. Performed at [facility], [place] [address], [place], [place] [address]   Culture, blood (Routine X 2) w Reflex to ID Panel     Status: None (Preliminary result)   Collection Time: 12/31/19  5:35 AM  Specimen: BLOOD RIGHT HAND  Result Value Ref Range Status   Specimen Description BLOOD RIGHT HAND  Final   Special Requests   Final     BOTTLES DRAWN AEROBIC AND ANAEROBIC Blood Culture adequate volume   Culture   Final    NO GROWTH 2 DAYS Performed at [facility], [address] [place], [place] [address]    Report Status PENDING  Incomplete  Culture, blood (Routine X 2) w Reflex to ID Panel     Status: None (Preliminary result)   Collection Time: 12/31/19  5:39 AM   Specimen: BLOOD  Result Value Ref Range Status   Specimen Description BLOOD RT INDEX FINGER  Final   Special Requests   Final    BOTTLES DRAWN AEROBIC AND ANAEROBIC Blood Culture adequate volume   Culture   Final    NO GROWTH 2 DAYS Performed at [facility], [place] [address], [place], [place] [address]    Report Status PENDING  Incomplete   MR THORACIC SPINE WO CONTRAST  Result Date: 01/01/2020 CLINICAL DATA:  Initial evaluation for suspected endocarditis, bacteremia, evaluate for possible discitis. EXAM: MRI THORACIC AND LUMBAR SPINE WITHOUT CONTRAST TECHNIQUE: Multiplanar and multiecho pulse sequences of the thoracic and lumbar spine were obtained without intravenous contrast. COMPARISON:  None. FINDINGS: MRI THORACIC SPINE FINDINGS Alignment: Vertebral bodies normally aligned with preservation of the normal thoracic kyphosis. No listhesis. Vertebrae: Mild chronic height loss noted at the superior endplate of K74. Vertebral body height otherwise maintained. Bone marrow signal intensity diffusely decreased on T1 weighted imaging, likely related to patient's history of end-stage renal disease. Multiple scattered benign hemangiomata noted within the lower thoracic spine. No worrisome osseous lesions. No MRI findings to suggest acute osteomyelitis or discitis. No definite evidence for septic arthritis. Cord: Signal intensity within the thoracic spinal cord is normal. No epidural abscess or other collection. Paraspinal and other soft tissues: Paraspinous soft tissues demonstrate no acute finding. Partially visualized lungs are grossly clear. Visualized  visceral structures unremarkable. Disc levels: Mild multilevel degenerative disc bulging noted at T3-4 through T11-12. No high-grade spinal stenosis or frank cord impingement. Discogenic reactive endplate changes present at T8-9 through T11-12. Foramina remain patent. MRI LUMBAR SPINE FINDINGS Segmentation: Standard. Lowest well-formed disc space labeled the L5-S1 level. Alignment: Physiologic with preservation of the normal lumbar lordosis. No listhesis. Vertebrae: Vertebral body height maintained without evidence for acute or chronic fracture. Bone marrow signal intensity heterogeneous and diffusely decreased on T1 weighted imaging, most likely related to history of end-stage renal disease. Few scattered benign hemangiomata noted. No worrisome osseous lesions. No abnormal marrow edema. No findings to suggest osteomyelitis discitis or septic arthritis. Conus medullaris and cauda equina: Conus extends to the L1 level. Conus and cauda equina appear normal. No epidural collections. Paraspinal and other soft tissues: Paraspinous soft tissues within normal limits. Visualized visceral structures are normal. Disc levels: L1-2:  Negative interspace.  Mild facet hypertrophy.  No stenosis. L2-3:  Negative interspace.  Mild facet hypertrophy.  No stenosis. L3-4: Mild circumferential disc bulge. Moderate facet and ligament flavum hypertrophy. Associated trace bilateral joint effusions. No significant canal or foraminal stenosis. L4-5: Mild diffuse disc bulge with disc desiccation. Superimposed small right foraminal to extraforaminal disc protrusion closely approximates the exiting right L4 nerve root (series 16, image 24). Mild to moderate facet hypertrophy. Resultant mild bilateral lateral recess stenosis. Mild bilateral L4 foraminal narrowing. L5-S1:  Negative interspace.  Mild facet hypertrophy.  No stenosis. IMPRESSION: MR THORACIC  SPINE IMPRESSION 1. No MRI evidence for osteomyelitis discitis or septic arthritis within  the thoracic spine. 2. Mild multilevel degenerative disc disease throughout the mid and lower thoracic spine without significant stenosis or neural impingement. MR LUMBAR SPINE IMPRESSION 1. No MRI evidence for osteomyelitis discitis or septic arthritis within the lumbar spine. 2. Small right foraminal to extraforaminal disc protrusion at L4-5, closely approximating and potentially irritating the exiting right L4 nerve root. Electronically Signed   By: Jeannine Boga M.D.   On: 01/01/2020 19:56   MR LUMBAR SPINE WO CONTRAST  Result Date: 01/01/2020 CLINICAL DATA:  Initial evaluation for suspected endocarditis, bacteremia, evaluate for possible discitis. EXAM: MRI THORACIC AND LUMBAR SPINE WITHOUT CONTRAST TECHNIQUE: Multiplanar and multiecho pulse sequences of the thoracic and lumbar spine were obtained without intravenous contrast. COMPARISON:  None. FINDINGS: MRI THORACIC SPINE FINDINGS Alignment: Vertebral bodies normally aligned with preservation of the normal thoracic kyphosis. No listhesis. Vertebrae: Mild chronic height loss noted at the superior endplate of T70. Vertebral body height otherwise maintained. Bone marrow signal intensity diffusely decreased on T1 weighted imaging, likely related to patient's history of end-stage renal disease. Multiple scattered benign hemangiomata noted within the lower thoracic spine. No worrisome osseous lesions. No MRI findings to suggest acute osteomyelitis or discitis. No definite evidence for septic arthritis. Cord: Signal intensity within the thoracic spinal cord is normal. No epidural abscess or other collection. Paraspinal and other soft tissues: Paraspinous soft tissues demonstrate no acute finding. Partially visualized lungs are grossly clear. Visualized visceral structures unremarkable. Disc levels: Mild multilevel degenerative disc bulging noted at T3-4 through T11-12. No high-grade spinal stenosis or frank cord impingement. Discogenic reactive endplate  changes present at T8-9 through T11-12. Foramina remain patent. MRI LUMBAR SPINE FINDINGS Segmentation: Standard. Lowest well-formed disc space labeled the L5-S1 level. Alignment: Physiologic with preservation of the normal lumbar lordosis. No listhesis. Vertebrae: Vertebral body height maintained without evidence for acute or chronic fracture. Bone marrow signal intensity heterogeneous and diffusely decreased on T1 weighted imaging, most likely related to history of end-stage renal disease. Few scattered benign hemangiomata noted. No worrisome osseous lesions. No abnormal marrow edema. No findings to suggest osteomyelitis discitis or septic arthritis. Conus medullaris and cauda equina: Conus extends to the L1 level. Conus and cauda equina appear normal. No epidural collections. Paraspinal and other soft tissues: Paraspinous soft tissues within normal limits. Visualized visceral structures are normal. Disc levels: L1-2:  Negative interspace.  Mild facet hypertrophy.  No stenosis. L2-3:  Negative interspace.  Mild facet hypertrophy.  No stenosis. L3-4: Mild circumferential disc bulge. Moderate facet and ligament flavum hypertrophy. Associated trace bilateral joint effusions. No significant canal or foraminal stenosis. L4-5: Mild diffuse disc bulge with disc desiccation. Superimposed small right foraminal to extraforaminal disc protrusion closely approximates the exiting right L4 nerve root (series 16, image 24). Mild to moderate facet hypertrophy. Resultant mild bilateral lateral recess stenosis. Mild bilateral L4 foraminal narrowing. L5-S1:  Negative interspace.  Mild facet hypertrophy.  No stenosis. IMPRESSION: MR THORACIC SPINE IMPRESSION 1. No MRI evidence for osteomyelitis discitis or septic arthritis within the thoracic spine. 2. Mild multilevel degenerative disc disease throughout the mid and lower thoracic spine without significant stenosis or neural impingement. MR LUMBAR SPINE IMPRESSION 1. No MRI evidence  for osteomyelitis discitis or septic arthritis within the lumbar spine. 2. Small right foraminal to extraforaminal disc protrusion at L4-5, closely approximating and potentially irritating the exiting right L4 nerve root. Electronically Signed   By: Howe Badder.D.  On: 01/01/2020 19:56   DG Chest Portable 1 View  Result Date: 12/29/2019 CLINICAL DATA:  Shortness of breath respiratory distress patient on CPAP EXAM: PORTABLE CHEST 1 VIEW COMPARISON:  08/13/2019 FINDINGS: Cardiomediastinal contours are stable. Hilar structures with question of vascular engorgement. Subtle increased interstitial markings. Subtle opacity at the right base. No dense consolidation. No signs of pleural effusion. Visualized skeletal structures are unremarkable. IMPRESSION: Subtle increased interstitial markings some of which could be technique related or mild edema/volume overload. Hazy at the right lung base may represent developing infection. Electronically Signed   By: Zetta Bills M.D.   On: 12/29/2019 08:39   ECHOCARDIOGRAM COMPLETE  Result Date: 12/30/2019    ECHOCARDIOGRAM REPORT   Patient Name:   TANYLA STEGE Date of Exam: 12/30/2019 Medical Rec #:  [id number]         Height:       64.0 in Accession #:    [id number]        Weight:       167.0 lb Date of Birth:  Dec 11, 1950          BSA:          1.812 m Patient Age:    62 years          BP:           143/75 mmHg Patient Gender: F                 HR:           73 bpm. Exam Location:  Inpatient Procedure: 2D Echo, Cardiac Doppler and Color Doppler Indications:    Bacteremia 790.7  History:        Patient has prior history of Echocardiogram examinations, most                 recent 08/06/2019. CHF, Signs/Symptoms:Dyspnea and Syncope; Risk                 Factors:Hypertension, Diabetes, Dyslipidemia and Former Smoker.  Sonographer:    Vickie Epley RDCS Referring Phys: [id number] [place]  1. Left ventricular ejection fraction, by estimation, is 45 to 50%.  The left ventricle has mildly decreased function. The left ventricle demonstrates global hypokinesis. Left ventricular diastolic parameters are consistent with Grade II diastolic dysfunction (pseudonormalization).  2. Right ventricular systolic function is normal. The right ventricular size is normal. There is normal pulmonary artery systolic pressure.  3. Left atrial size was mildly dilated.  4. The mitral valve is normal in structure and function. Trivial mitral valve regurgitation.  5. The aortic valve is normal in structure and function. Aortic valve regurgitation is not visualized.  6. The inferior vena cava is normal in size with greater than 50% respiratory variability, suggesting right atrial pressure of 3 mmHg. FINDINGS  Left Ventricle: Left ventricular ejection fraction, by estimation, is 45 to 50%. The left ventricle has mildly decreased function. The left ventricle demonstrates global hypokinesis. The left ventricular internal cavity size was normal in size. There is  no left ventricular hypertrophy. Left ventricular diastolic parameters are consistent with Grade II diastolic dysfunction (pseudonormalization). Right Ventricle: The right ventricular size is normal. No increase in right ventricular wall thickness. Right ventricular systolic function is normal. There is normal pulmonary artery systolic pressure. The tricuspid regurgitant velocity is 2.22 m/s, and  with an assumed right atrial pressure of 3 mmHg, the estimated right ventricular systolic pressure is 82.9 mmHg. Left Atrium: Left atrial size was mildly  dilated. Right Atrium: Right atrial size was normal in size. Pericardium: There is no evidence of pericardial effusion. Mitral Valve: The mitral valve is normal in structure and function. Trivial mitral valve regurgitation. Tricuspid Valve: The tricuspid valve is normal in structure. Tricuspid valve regurgitation is trivial. Aortic Valve: The aortic valve is normal in structure and function.  Aortic valve regurgitation is not visualized. Pulmonic Valve: The pulmonic valve was grossly normal. Pulmonic valve regurgitation is trivial. Aorta: The aortic root and ascending aorta are structurally normal, with no evidence of dilitation. Venous: The inferior vena cava is normal in size with greater than 50% respiratory variability, suggesting right atrial pressure of 3 mmHg. IAS/Shunts: No atrial level shunt detected by color flow Doppler.  LEFT VENTRICLE PLAX 2D LVIDd:         4.50 cm      Diastology LVIDs:         3.50 cm      LV e' lateral:   4.87 cm/s LV PW:         0.80 cm      LV E/e' lateral: 19.2 LV IVS:        0.80 cm      LV e' medial:    3.89 cm/s LVOT diam:     1.80 cm      LV E/e' medial:  24.0 LV SV:         50 LV SV Index:   27 LVOT Area:     2.54 cm  LV Volumes (MOD) LV vol d, MOD A2C: 102.0 ml LV vol d, MOD A4C: 111.0 ml LV vol s, MOD A2C: 56.7 ml LV vol s, MOD A4C: 58.0 ml LV SV MOD A2C:     45.3 ml LV SV MOD A4C:     111.0 ml LV SV MOD BP:      51.1 ml RIGHT VENTRICLE RV S prime:     13.20 cm/s TAPSE (M-mode): 2.0 cm LEFT ATRIUM             Index       RIGHT ATRIUM          Index LA diam:        3.40 cm 1.88 cm/m  RA Area:     8.86 cm LA Vol (A2C):   29.4 ml 16.22 ml/m RA Volume:   17.80 ml 9.82 ml/m LA Vol (A4C):   44.7 ml 24.67 ml/m LA Biplane Vol: 38.4 ml 21.19 ml/m  AORTIC VALVE LVOT Vmax:   91.40 cm/s LVOT Vmean:  61.300 cm/s LVOT VTI:    0.195 m  AORTA Ao Root diam: 3.10 cm MITRAL VALVE               TRICUSPID VALVE MV Area (PHT): 2.69 cm    TR Peak grad:   19.7 mmHg MV Decel Time: 282 msec    TR Vmax:        222.00 cm/s MV E velocity: 93.40 cm/s MV A velocity: 45.80 cm/s  SHUNTS MV E/A ratio:  2.04        Systemic VTI:  0.20 m                            Systemic Diam: 1.80 cm Glori Bickers MD Electronically signed by Glori Bickers MD Signature Date/Time: 12/30/2019/6:38:24 PM    Final    ECHO TEE  Result Date: 01/02/2020    TRANSESOPHOGEAL ECHO REPORT   Patient Name:  Lawson Radar Date of Exam: 01/02/2020 Medical Rec #:  [id number]         Height:       64.0 in Accession #:    [id number]        Weight:       147.0 lb Date of Birth:  [birth year]          BSA:          1.717 m Patient Age:    41 years          BP:           140/60 mmHg Patient Gender: F                 HR:           77 bpm. Exam Location:  Inpatient Procedure: Transesophageal Echo, 3D Echo, Cardiac Doppler and Color Doppler                               MODIFIED REPORT: This report was modified by Cherlynn Kaiser MD on 01/02/2020 due to amendment.  Indications:     Bacteremia  History:         Patient has prior history of Echocardiogram examinations, most                  recent 12/30/2019.  Sonographer:     Philipp Deputy Referring Phys:  [id number] ERIK C HOFFMAN Diagnosing Phys: Cherlynn Kaiser MD PROCEDURE: After discussion of the risks and benefits of a TEE, an informed consent was obtained from the patient. TEE procedure time was 20 minutes. The transesophogeal probe was passed without difficulty through the esophogus of the patient. Imaged were obtained with the patient in a left lateral decubitus position. Local oropharyngeal anesthetic was provided with Cetacaine. Sedation performed by different physician. The patient was monitored while under deep sedation. Anesthestetic sedation was provided intravenously by Anesthesiology: 50mg  of Propofol. Image quality was good. The patient's vital signs; including heart rate, blood pressure, and oxygen saturation; remained stable throughout the procedure. The patient developed no complications during the procedure. IMPRESSIONS  1. Left ventricular ejection fraction, by estimation, is 45 to 50%. The left ventricle has mildly decreased function. The left ventricle demonstrates global hypokinesis.  2. Right ventricular systolic function is normal. The right ventricular size is normal.  3. No left atrial/left atrial appendage thrombus was detected.  4. The mitral valve is  grossly normal. Mild mitral valve regurgitation.  5. The aortic valve is tricuspid. Aortic valve regurgitation is trivial. No aortic stenosis is present.  6. There is mild (Grade II) atheroma plaque involving the transverse and descending aorta.  7. Agitated saline contrast bubble study was negative, with no evidence of any interatrial shunt. Conclusion(s)/Recommendation(s): No evidence of vegetation/infective endocarditis on this transesophageal echocardiogram. FINDINGS  Left Ventricle: Left ventricular ejection fraction, by estimation, is 45 to 50%. The left ventricle has mildly decreased function. The left ventricle demonstrates global hypokinesis. The left ventricular internal cavity size was normal in size. There is  no left ventricular hypertrophy. Right Ventricle: The right ventricular size is normal. No increase in right ventricular wall thickness. Right ventricular systolic function is normal. Left Atrium: Left atrial size was normal in size. No left atrial/left atrial appendage thrombus was detected. Right Atrium: Right atrial size was normal in size. Prominent Eustachian valve. Pericardium: A small pericardial effusion is present. Mitral Valve: The mitral valve is grossly normal.  Mild mitral valve regurgitation. There is no evidence of mitral valve vegetation. Tricuspid Valve: The tricuspid valve is normal in structure. Tricuspid valve regurgitation is trivial. There is no evidence of tricuspid valve vegetation. Aortic Valve: The aortic valve is tricuspid. Aortic valve regurgitation is trivial. No aortic stenosis is present. There is no evidence of aortic valve vegetation. Pulmonic Valve: The pulmonic valve was normal in structure. Pulmonic valve regurgitation is trivial. There is no evidence of pulmonic valve vegetation. Aorta: The aortic root and ascending aorta are structurally normal, with no evidence of dilitation. There is mild (Grade II) atheroma plaque involving the transverse and descending  aorta. IAS/Shunts: No atrial level shunt detected by color flow Doppler. Agitated saline contrast was given intravenously to evaluate for intracardiac shunting. Agitated saline contrast bubble study was negative, with no evidence of any interatrial shunt.   AORTA Ao Sinus diam: 3.20 cm Ao Asc diam:   3.10 cm Cherlynn Kaiser MD Electronically signed by Cherlynn Kaiser MD Signature Date/Time: 01/02/2020/7:51:24 PM    Final (Updated)    Discharge Instructions: Discharge Instructions    Call MD for:  persistant nausea and vomiting   Complete by: As directed    Call MD for:  redness, tenderness, or signs of infection (pain, swelling, redness, odor or green/yellow discharge around incision site)   Complete by: As directed    Around your left-sided dialysis graft   Call MD for:  severe uncontrolled pain   Complete by: As directed    Call MD for:  temperature >100.4   Complete by: As directed    Diet - low sodium heart healthy   Complete by: As directed    Discharge instructions   Complete by: As directed    Ms. Splinter,  You were admitted to the hospital due to fevers and an infection in your bloodstream. This was treated with IV antibiotics. Investigation of your graft, back, and heart valves did not show evidence of the infection sticking to those parts of the body. It is important that you complete outpatient antibiotic treatment for this infection. An antibiotic, cefazolin, will be given to you intravenously after your outpatient dialysis sessions until March 20th. At that time, repeat blood work will be taken to ensure clearance of the bacteria from your blood.  Please follow-up with the infectious disease clinic in 3 weeks.   Thank you for letting us be a part of your care!   Increase activity slowly   Complete by: As directed       Signed: Molli Hazard A, DO 01/03/2020, 2:59 PM Pager: [phone]

## 2020-01-04 LAB — CULTURE, BLOOD (ROUTINE X 2)
Culture: NO GROWTH
Special Requests: ADEQUATE

## 2020-01-05 LAB — CULTURE, BLOOD (ROUTINE X 2)
Culture: NO GROWTH
Culture: NO GROWTH
Special Requests: ADEQUATE
Special Requests: ADEQUATE

## 2020-01-11 ENCOUNTER — Ambulatory Visit: Payer: Medicare Other | Attending: Internal Medicine

## 2020-01-11 DIAGNOSIS — Z23 Encounter for immunization: Secondary | ICD-10-CM

## 2020-01-11 NOTE — Progress Notes (Signed)
   Covid-19 Vaccination Clinic  Name:  Melissa Howe    MRN: [id number] DOB: 25-Jun-1951  01/11/2020  Melissa Howe was observed post Covid-19 immunization for 15 minutes without incident. She was provided with Vaccine Information Sheet and instruction to access the V-Safe system.   Melissa Howe was instructed to call 911 with any severe reactions post vaccine: Marland Kitchen Difficulty breathing  . Swelling of face and throat  . A fast heartbeat  . A bad rash all over body  . Dizziness and weakness   Immunizations Administered    Name Date Dose VIS Date Route   Pfizer COVID-19 Vaccine 01/11/2020  4:20 PM 0.3 mL 10/07/2019 Intramuscular   Manufacturer: [place]   Lot: [id number]   [place]: [id number]

## 2020-01-24 ENCOUNTER — Inpatient Hospital Stay: Payer: Medicare Other | Admitting: Family

## 2020-02-01 ENCOUNTER — Ambulatory Visit (INDEPENDENT_AMBULATORY_CARE_PROVIDER_SITE_OTHER): Payer: Medicare Other | Admitting: Infectious Diseases

## 2020-02-01 ENCOUNTER — Other Ambulatory Visit: Payer: Self-pay

## 2020-02-01 DIAGNOSIS — B9561 Methicillin susceptible Staphylococcus aureus infection as the cause of diseases classified elsewhere: Secondary | ICD-10-CM | POA: Diagnosis not present

## 2020-02-01 DIAGNOSIS — R7881 Bacteremia: Secondary | ICD-10-CM

## 2020-02-03 ENCOUNTER — Encounter: Payer: Self-pay | Admitting: Infectious Diseases
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
## 2020-02-03 NOTE — Assessment & Plan Note (Signed)
Suspect possible transient bacteremia following possible infected clot or peripheral stick from HD access vs secondary bacteremia from possible pneumonia (less likely and respiratory issues were more c/w pulmonary edema).  She completed IV antibiotics a few weeks ago and looks great. She has no findings on exam concerning for relapsing bacteremia today. She would very much like to avoid a lab stick if she can, so we will defer repeating any follow up surveillance cultures at this time given good clinical reassurance for cure.  We discussed that given HD she is at higher risk for relapsing infection. She was given mupiricin to de-colonize in hospital.  She will follow up here PRN.

## 2020-03-15 DIAGNOSIS — I739 Peripheral vascular disease, unspecified: Secondary | ICD-10-CM | POA: Insufficient documentation

## 2020-04-12 IMAGING — CR DG FOOT COMPLETE 3+V*L*
3 series · 3 of 3 positions shown · non-contrast
Comparison: None

CLINICAL DATA: Pain, diabetes mellitus, hypertension

EXAM:
LEFT FOOT - COMPLETE 3+ VIEW

[x foot ap left]
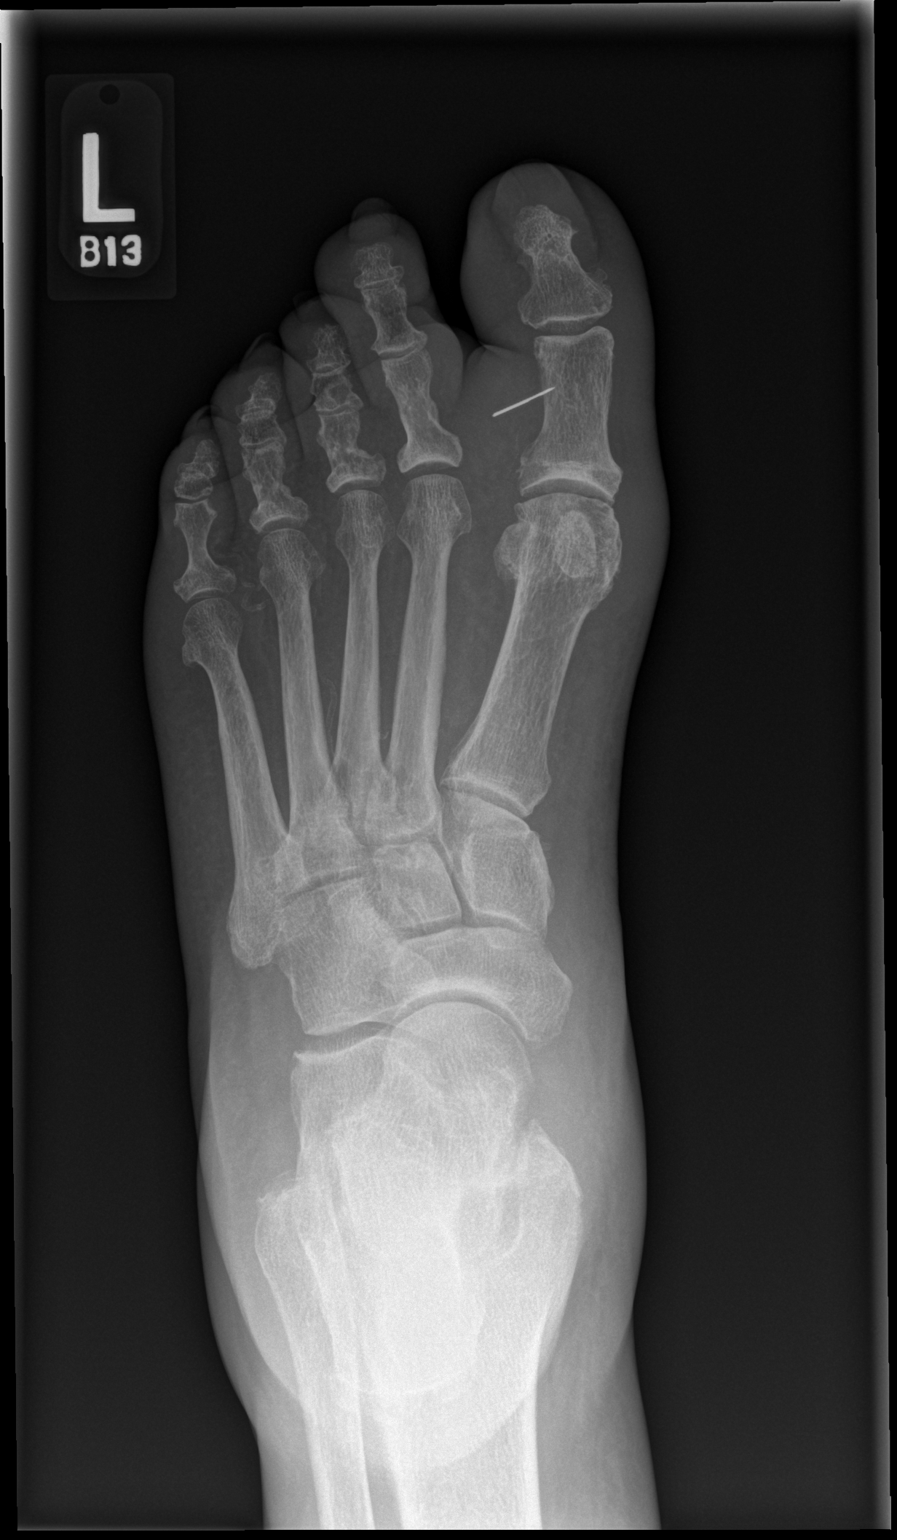

[x foot obl left]
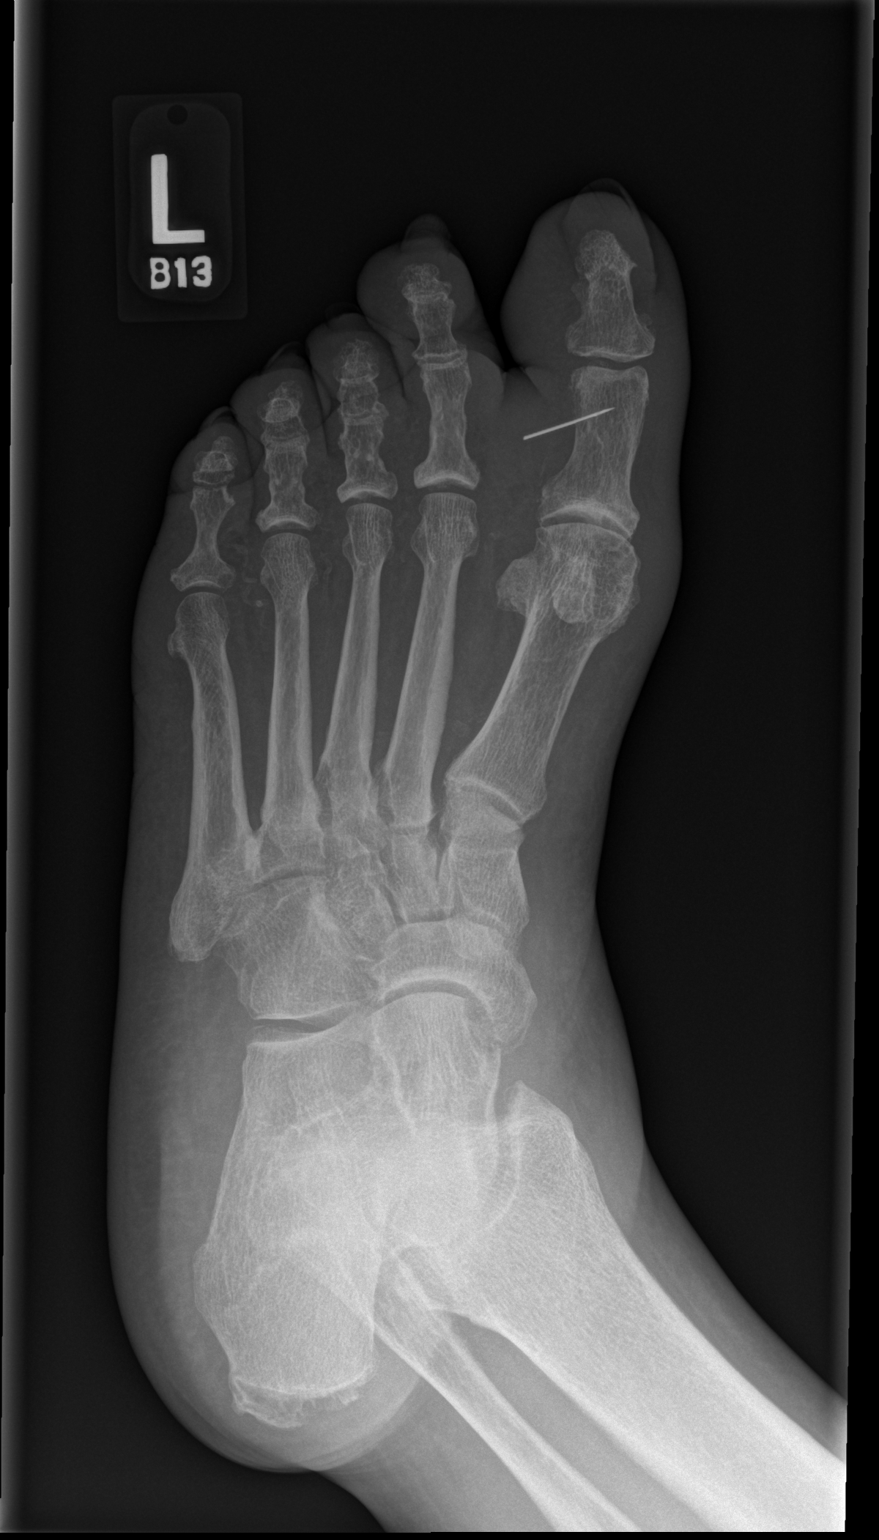

[x foot lat left]
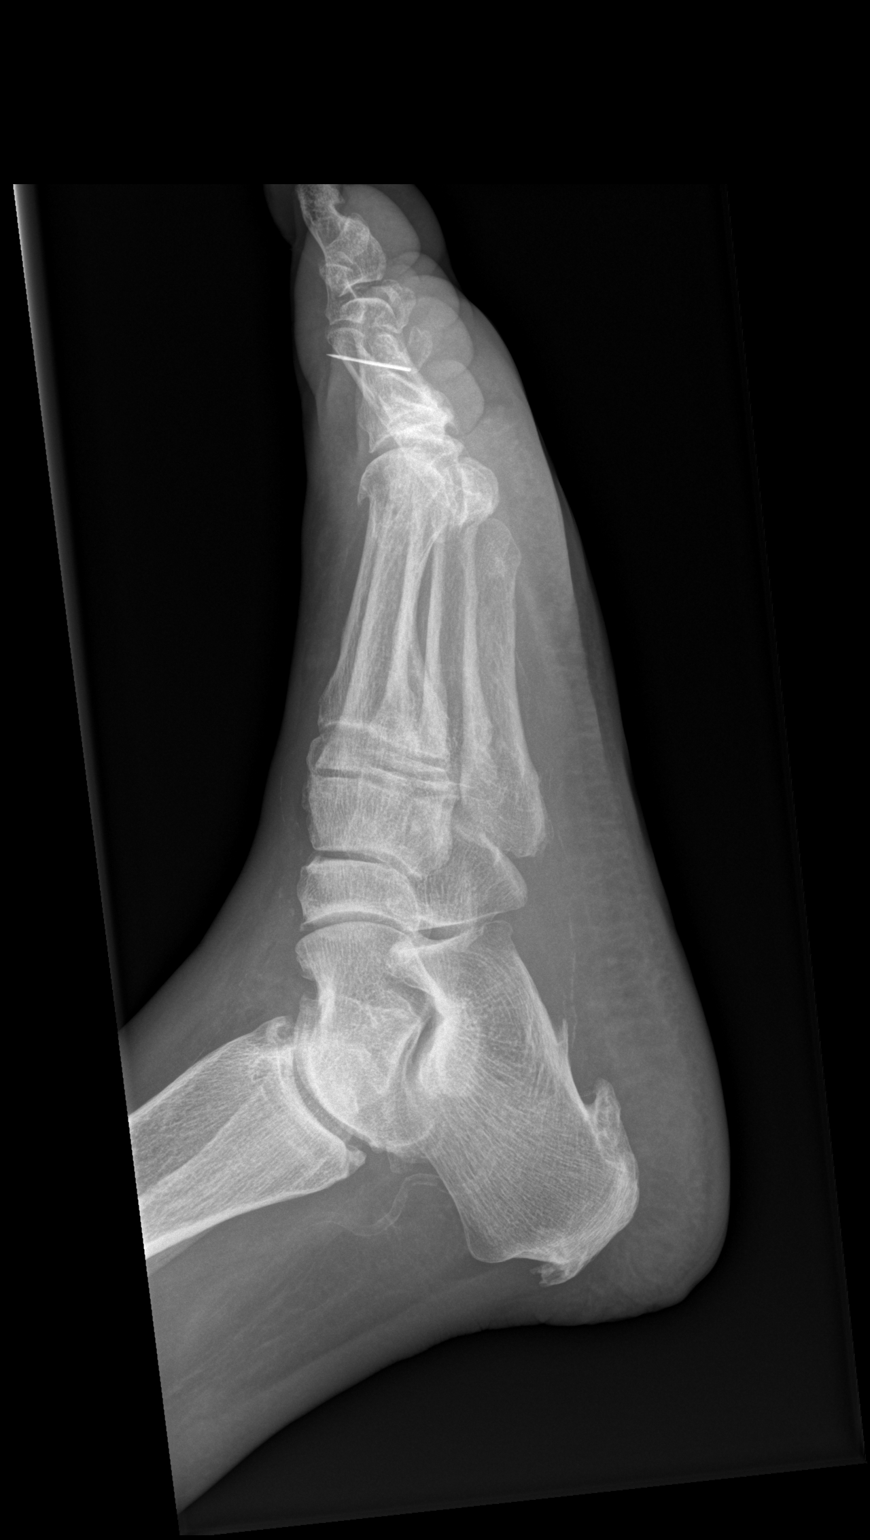

[3 of 3 positions shown; findings below may reference images not displayed]

FINDINGS: Linear metallic foreign body 19 mm long lateral to the proximal
phalanx of the great toe.

Bones appear demineralized.

Mild degenerative changes first MTP joint.

No acute fracture, dislocation, or bone destruction.

Plantar calcaneal spur.

Mild scattered soft tissue swelling and small vessel vascular
calcifications.
IMPRESSION: Metallic foreign body question needle fragment lateral to the
proximal phalanx of the LEFT great toe.

## 2020-04-12 IMAGING — CR DG ANKLE COMPLETE 3+V*L*
3 series · 3 of 3 positions shown · non-contrast
Comparison: None.

CLINICAL DATA: 67-year-old female with a history of left foot and
ankle pain

EXAM:
LEFT ANKLE COMPLETE - 3+ VIEW

[x ankle ap left]
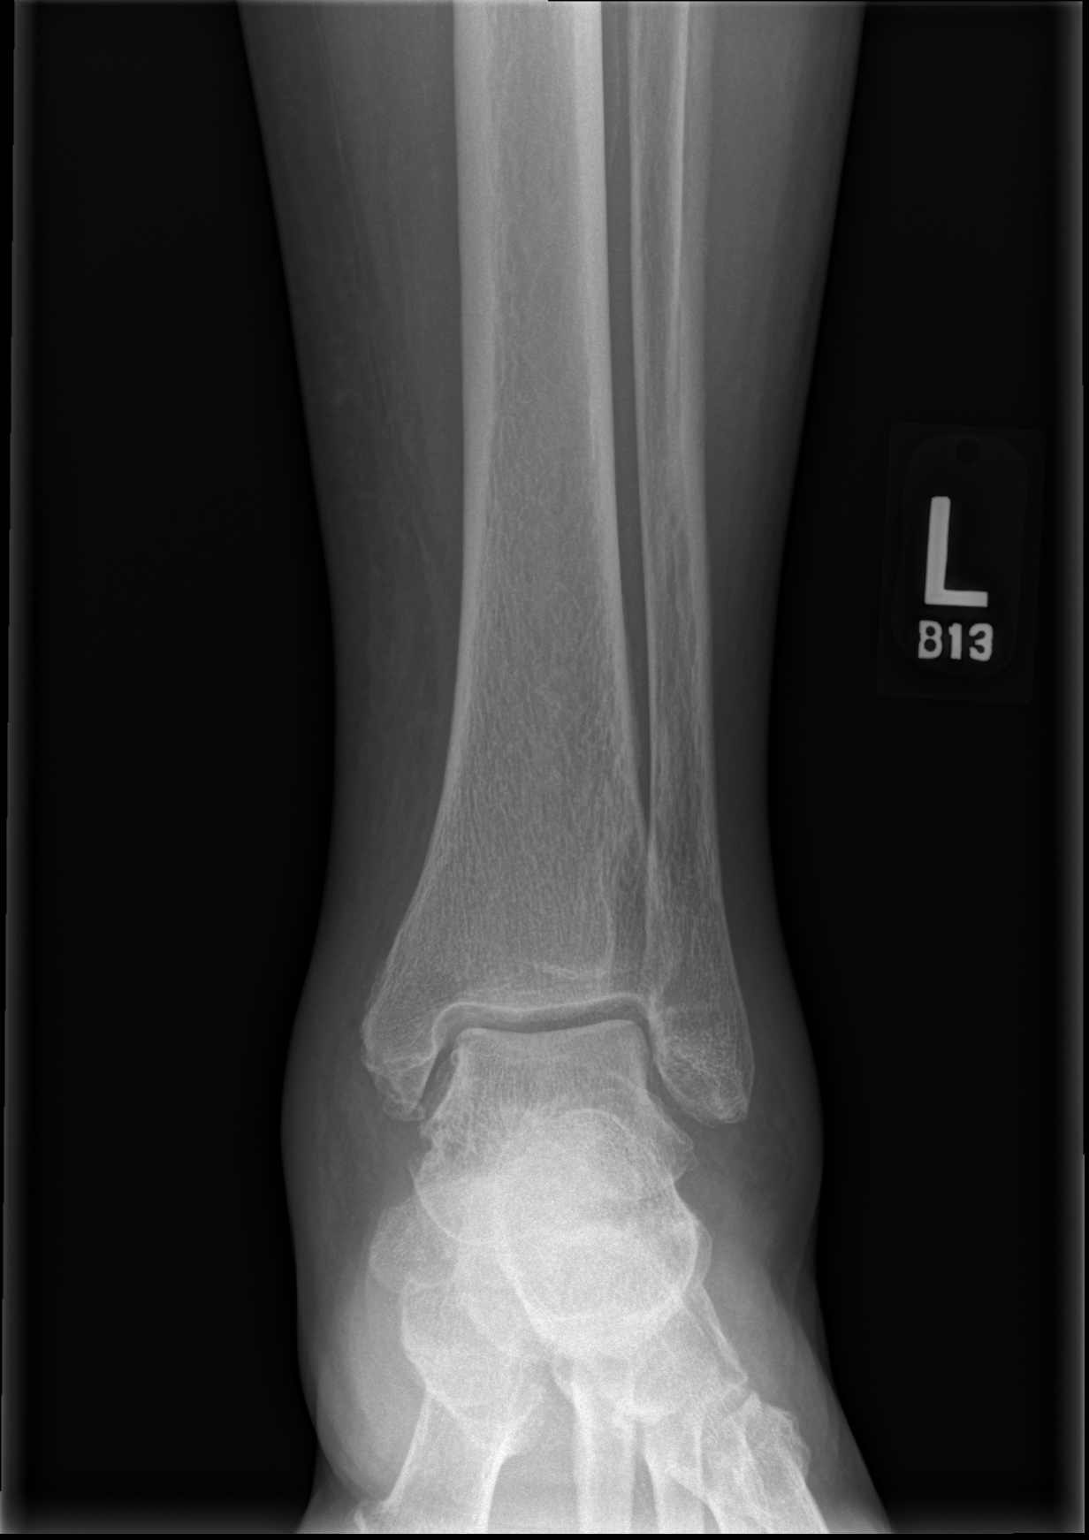

[x ankle obl left]
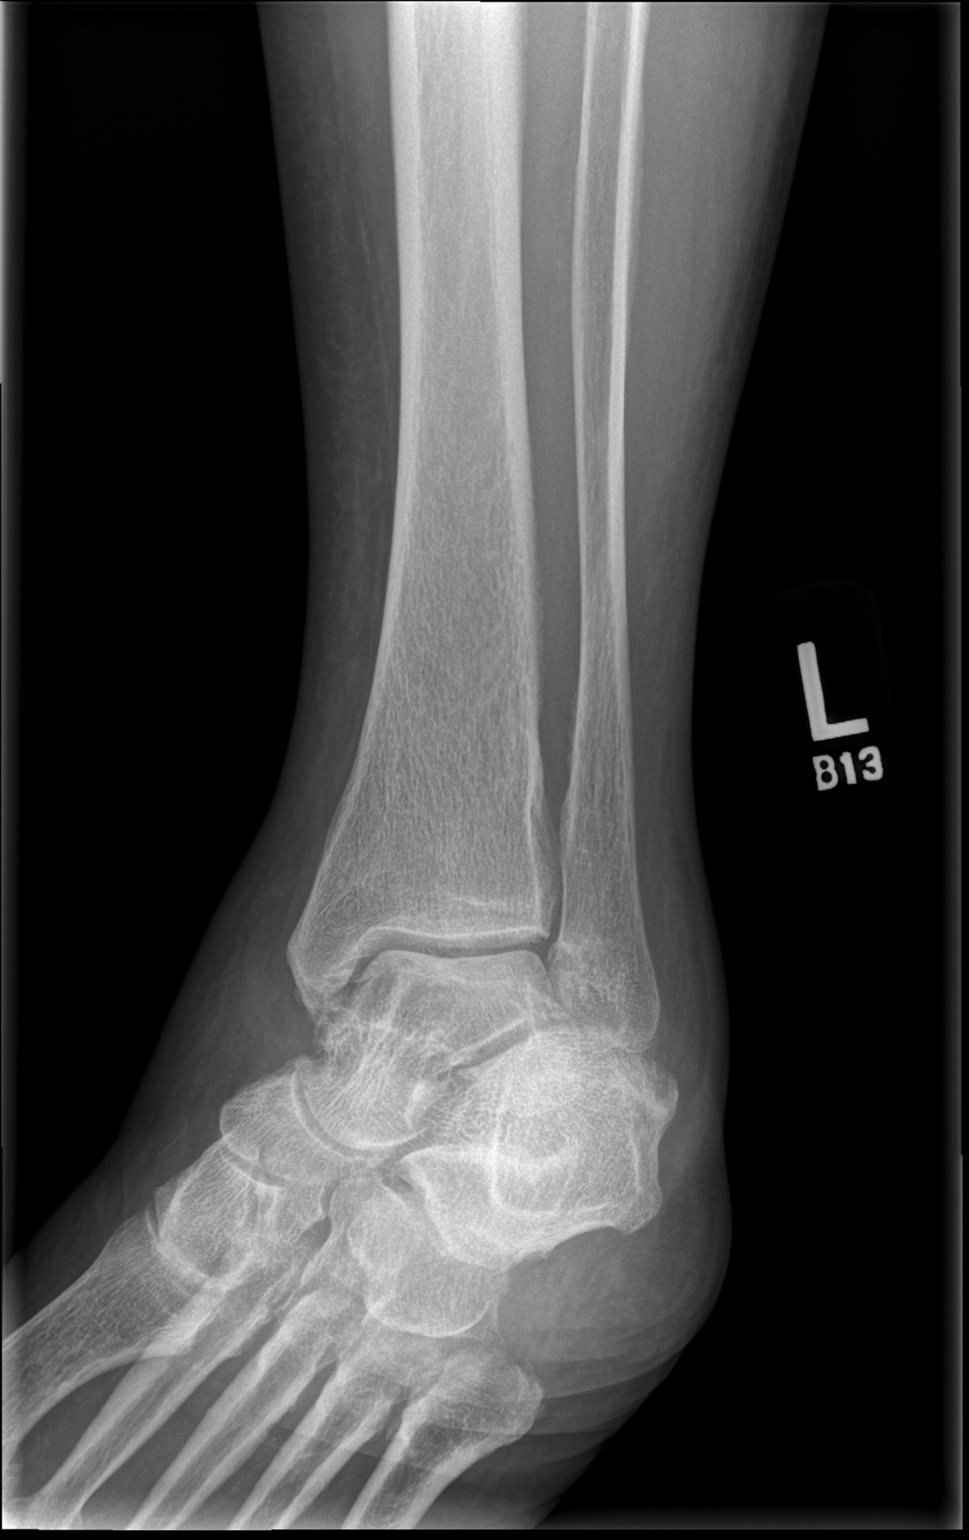

[x ankle lat left]
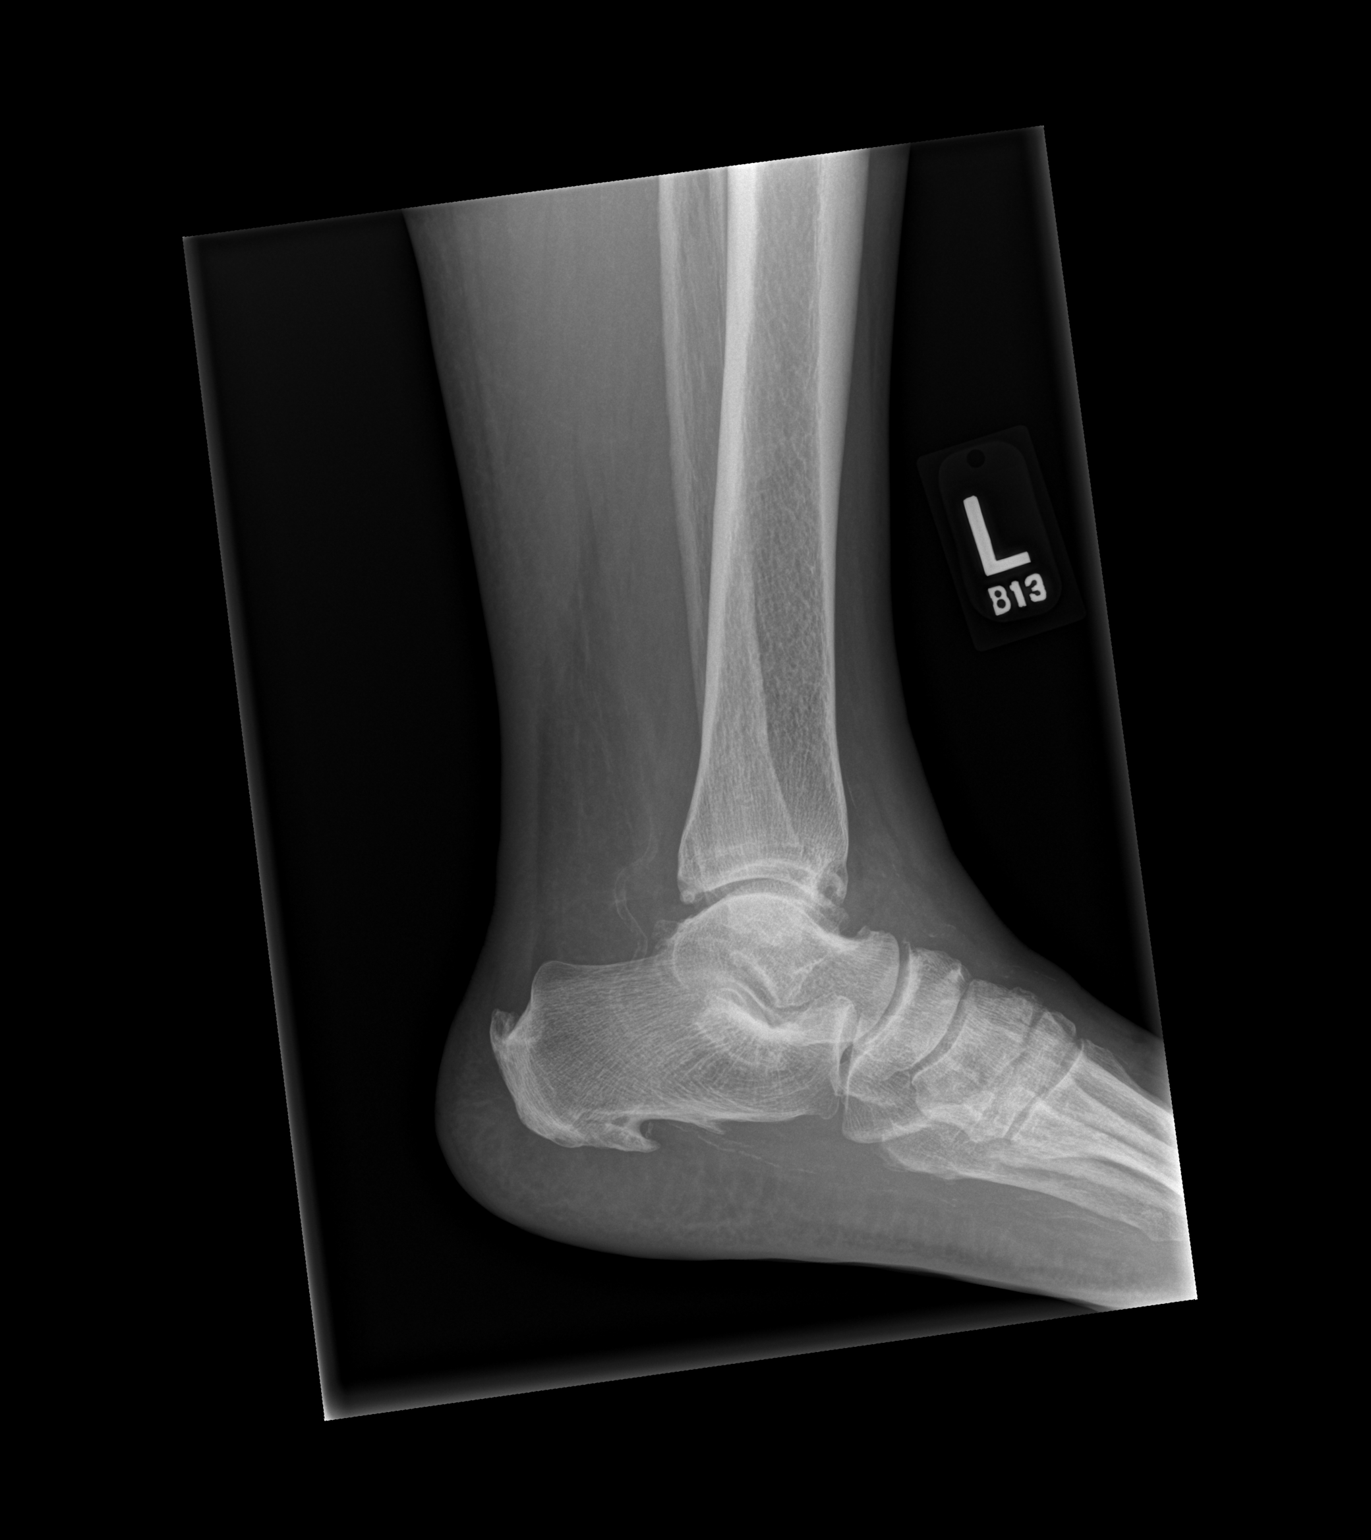

[3 of 3 positions shown; findings below may reference images not displayed]

FINDINGS: No acute displaced fracture. Degenerative changes of the hindfoot
including the tibiotalar joint. Degenerative changes of the midfoot.
Enthesopathic changes at the Achilles insertion and plantar fascia
insertion.

Ankle mortise congruent.

Medial calcinosis of the tibial and pedal vessels. No joint
effusion.
IMPRESSION: Negative for acute bony abnormality.

Degenerative changes of the midfoot and hindfoot.

Atherosclerosis of the tibial pedal vessels.

## 2020-07-21 ENCOUNTER — Encounter (HOSPITAL_COMMUNITY): Payer: Self-pay | Admitting: Internal Medicine

## 2020-07-21 ENCOUNTER — Inpatient Hospital Stay (HOSPITAL_COMMUNITY)
Admission: EM | Admit: 2020-07-21 | Discharge: 2020-07-24 | DRG: 291 | Disposition: A | Discharge status: Home / Self Care | Payer: Medicare Other | Attending: Internal Medicine | Admitting: Internal Medicine

## 2020-07-21 ENCOUNTER — Other Ambulatory Visit: Payer: Self-pay

## 2020-07-21 ENCOUNTER — Emergency Department (HOSPITAL_COMMUNITY): Payer: Medicare Other

## 2020-07-21 DIAGNOSIS — Z87891 Personal history of nicotine dependence: Secondary | ICD-10-CM

## 2020-07-21 DIAGNOSIS — K254 Chronic or unspecified gastric ulcer with hemorrhage: Secondary | ICD-10-CM | POA: Diagnosis present

## 2020-07-21 DIAGNOSIS — Z794 Long term (current) use of insulin: Secondary | ICD-10-CM

## 2020-07-21 DIAGNOSIS — R0600 Dyspnea, unspecified: Secondary | ICD-10-CM

## 2020-07-21 DIAGNOSIS — I132 Hypertensive heart and chronic kidney disease with heart failure and with stage 5 chronic kidney disease, or end stage renal disease: Principal | ICD-10-CM | POA: Diagnosis present

## 2020-07-21 DIAGNOSIS — E1136 Type 2 diabetes mellitus with diabetic cataract: Secondary | ICD-10-CM | POA: Diagnosis present

## 2020-07-21 DIAGNOSIS — I959 Hypotension, unspecified: Secondary | ICD-10-CM | POA: Diagnosis present

## 2020-07-21 DIAGNOSIS — K635 Polyp of colon: Secondary | ICD-10-CM | POA: Diagnosis present

## 2020-07-21 DIAGNOSIS — I5043 Acute on chronic combined systolic (congestive) and diastolic (congestive) heart failure: Secondary | ICD-10-CM | POA: Diagnosis present

## 2020-07-21 DIAGNOSIS — E1140 Type 2 diabetes mellitus with diabetic neuropathy, unspecified: Secondary | ICD-10-CM | POA: Diagnosis present

## 2020-07-21 DIAGNOSIS — E1165 Type 2 diabetes mellitus with hyperglycemia: Secondary | ICD-10-CM | POA: Diagnosis present

## 2020-07-21 DIAGNOSIS — Z20822 Contact with and (suspected) exposure to covid-19: Secondary | ICD-10-CM | POA: Diagnosis present

## 2020-07-21 DIAGNOSIS — Z833 Family history of diabetes mellitus: Secondary | ICD-10-CM

## 2020-07-21 DIAGNOSIS — Z8249 Family history of ischemic heart disease and other diseases of the circulatory system: Secondary | ICD-10-CM

## 2020-07-21 DIAGNOSIS — E1122 Type 2 diabetes mellitus with diabetic chronic kidney disease: Secondary | ICD-10-CM | POA: Diagnosis present

## 2020-07-21 DIAGNOSIS — Z9119 Patient's noncompliance with other medical treatment and regimen: Secondary | ICD-10-CM

## 2020-07-21 DIAGNOSIS — Z992 Dependence on renal dialysis: Secondary | ICD-10-CM

## 2020-07-21 DIAGNOSIS — N2581 Secondary hyperparathyroidism of renal origin: Secondary | ICD-10-CM | POA: Diagnosis present

## 2020-07-21 DIAGNOSIS — N186 End stage renal disease: Secondary | ICD-10-CM | POA: Diagnosis present

## 2020-07-21 DIAGNOSIS — E8770 Fluid overload, unspecified: Secondary | ICD-10-CM | POA: Diagnosis not present

## 2020-07-21 DIAGNOSIS — I10 Essential (primary) hypertension: Secondary | ICD-10-CM | POA: Diagnosis present

## 2020-07-21 DIAGNOSIS — E785 Hyperlipidemia, unspecified: Secondary | ICD-10-CM | POA: Diagnosis present

## 2020-07-21 DIAGNOSIS — D638 Anemia in other chronic diseases classified elsewhere: Secondary | ICD-10-CM | POA: Diagnosis present

## 2020-07-21 DIAGNOSIS — J9601 Acute respiratory failure with hypoxia: Secondary | ICD-10-CM | POA: Diagnosis present

## 2020-07-21 DIAGNOSIS — Z7982 Long term (current) use of aspirin: Secondary | ICD-10-CM

## 2020-07-21 DIAGNOSIS — D500 Iron deficiency anemia secondary to blood loss (chronic): Secondary | ICD-10-CM | POA: Diagnosis present

## 2020-07-21 DIAGNOSIS — E8889 Other specified metabolic disorders: Secondary | ICD-10-CM | POA: Diagnosis present

## 2020-07-21 DIAGNOSIS — R0602 Shortness of breath: Secondary | ICD-10-CM | POA: Diagnosis not present

## 2020-07-21 DIAGNOSIS — Z79899 Other long term (current) drug therapy: Secondary | ICD-10-CM

## 2020-07-21 LAB — CBC WITH DIFFERENTIAL/PLATELET
Abs Immature Granulocytes: 0.03 10*3/uL (ref 0.00–0.07)
Basophils Absolute: 0 10*3/uL (ref 0.0–0.1)
Basophils Relative: 0 %
Eosinophils Absolute: 0.3 10*3/uL (ref 0.0–0.5)
Eosinophils Relative: 3 %
HCT: 23.1 % — ABNORMAL LOW (ref 36.0–46.0)
Hemoglobin: 7 g/dL — ABNORMAL LOW (ref 12.0–15.0)
Immature Granulocytes: 0 %
Lymphocytes Relative: 33 %
Lymphs Abs: 3.4 10*3/uL (ref 0.7–4.0)
MCH: 30.4 pg (ref 26.0–34.0)
MCHC: 30.3 g/dL (ref 30.0–36.0)
MCV: 100.4 fL — ABNORMAL HIGH (ref 80.0–100.0)
Monocytes Absolute: 0.8 10*3/uL (ref 0.1–1.0)
Monocytes Relative: 8 %
Neutro Abs: 5.6 10*3/uL (ref 1.7–7.7)
Neutrophils Relative %: 56 %
Platelets: 408 10*3/uL — ABNORMAL HIGH (ref 150–400)
RBC: 2.3 MIL/uL — ABNORMAL LOW (ref 3.87–5.11)
RDW: 15.6 % — ABNORMAL HIGH (ref 11.5–15.5)
WBC: 10.2 10*3/uL (ref 4.0–10.5)
nRBC: 0 % (ref 0.0–0.2)

## 2020-07-21 LAB — BASIC METABOLIC PANEL
Anion gap: 15 (ref 5–15)
BUN: 22 mg/dL (ref 8–23)
CO2: 29 mmol/L (ref 22–32)
Calcium: 9.9 mg/dL (ref 8.9–10.3)
Chloride: 94 mmol/L — ABNORMAL LOW (ref 98–111)
Creatinine, Ser: 5.3 mg/dL — ABNORMAL HIGH (ref 0.44–1.00)
GFR calc Af Amer: 9 mL/min — ABNORMAL LOW (ref >60)
GFR calc non Af Amer: 8 mL/min — ABNORMAL LOW (ref >60)
Glucose, Bld: 285 mg/dL — ABNORMAL HIGH (ref 70–99)
Potassium: 4.3 mmol/L (ref 3.5–5.1)
Sodium: 138 mmol/L (ref 135–145)

## 2020-07-21 LAB — I-STAT CHEM 8, ED
BUN: 27 mg/dL — ABNORMAL HIGH (ref 8–23)
Calcium, Ion: 1.14 mmol/L — ABNORMAL LOW (ref 1.15–1.40)
Chloride: 95 mmol/L — ABNORMAL LOW (ref 98–111)
Creatinine, Ser: 5.8 mg/dL — ABNORMAL HIGH (ref 0.44–1.00)
Glucose, Bld: 288 mg/dL — ABNORMAL HIGH (ref 70–99)
HCT: 21 % — ABNORMAL LOW (ref 36.0–46.0)
Hemoglobin: 7.1 g/dL — ABNORMAL LOW (ref 12.0–15.0)
Potassium: 4.3 mmol/L (ref 3.5–5.1)
Sodium: 136 mmol/L (ref 135–145)
TCO2: 32 mmol/L (ref 22–32)

## 2020-07-21 LAB — CBG MONITORING, ED
Glucose-Capillary: 86 mg/dL (ref 70–99)
Glucose-Capillary: 157 mg/dL — ABNORMAL HIGH (ref 70–99)

## 2020-07-21 LAB — IRON AND TIBC
Iron: 45 ug/dL (ref 28–170)
Saturation Ratios: 17 % (ref 10.4–31.8)
TIBC: 262 ug/dL (ref 250–450)
UIBC: 217 ug/dL

## 2020-07-21 LAB — HEMOGLOBIN A1C
Hgb A1c MFr Bld: 8.7 % — ABNORMAL HIGH (ref 4.8–5.6)
Mean Plasma Glucose: 202.99 mg/dL

## 2020-07-21 LAB — GLUCOSE, CAPILLARY
Glucose-Capillary: 174 mg/dL — ABNORMAL HIGH (ref 70–99)
Glucose-Capillary: 214 mg/dL — ABNORMAL HIGH (ref 70–99)

## 2020-07-21 LAB — D-DIMER, QUANTITATIVE: D-Dimer, Quant: 1.32 ug/mL-FEU — ABNORMAL HIGH (ref 0.00–0.50)

## 2020-07-21 LAB — FOLATE: Folate: 59 ng/mL (ref >5.9)

## 2020-07-21 LAB — RESPIRATORY PANEL BY RT PCR (FLU A&B, COVID)
Influenza A by PCR: NEGATIVE
Influenza B by PCR: NEGATIVE
SARS Coronavirus 2 by RT PCR: NEGATIVE

## 2020-07-21 LAB — ABO/RH: ABO/RH(D): AB POS

## 2020-07-21 LAB — BRAIN NATRIURETIC PEPTIDE: B Natriuretic Peptide: 1528.7 pg/mL — ABNORMAL HIGH (ref 0.0–100.0)

## 2020-07-21 LAB — FERRITIN: Ferritin: 1016 ng/mL — ABNORMAL HIGH (ref 11–307)

## 2020-07-21 MED ORDER — FERROUS SULFATE 325 (65 FE) MG PO TABS
325.0000 mg | ORAL_TABLET | Freq: Every day | ORAL | Status: DC
  Administered 2020-07-21: 325 mg via ORAL
  Filled 2020-07-21: qty 1

## 2020-07-21 MED ORDER — HEPARIN SODIUM (PORCINE) 1000 UNIT/ML DIALYSIS
2000.0000 [IU] | INTRAMUSCULAR | Status: DC | PRN
  Filled 2020-07-21: qty 2

## 2020-07-21 MED ORDER — ONDANSETRON HCL 4 MG/2ML IJ SOLN: 4.0000 mg | Freq: Four times a day (QID) | INTRAMUSCULAR | Status: DC | PRN

## 2020-07-21 MED ORDER — CALCITRIOL 0.5 MCG PO CAPS
2.5000 ug | ORAL_CAPSULE | ORAL | Status: DC
  Administered 2020-07-24: 2.5 ug via ORAL
  Filled 2020-07-21 (×2): qty 5

## 2020-07-21 MED ORDER — INSULIN DETEMIR 100 UNIT/ML ~~LOC~~ SOLN
18.0000 [IU] | Freq: Every day | SUBCUTANEOUS | Status: DC
  Administered 2020-07-21 – 2020-07-22 (×2): 18 [IU] via SUBCUTANEOUS
  Filled 2020-07-21 (×3): qty 0.18

## 2020-07-21 MED ORDER — CHLORHEXIDINE GLUCONATE CLOTH 2 % EX PADS: 6.0000 | MEDICATED_PAD | Freq: Every day | CUTANEOUS | Status: DC

## 2020-07-21 MED ORDER — ASPIRIN EC 81 MG PO TBEC
81.0000 mg | DELAYED_RELEASE_TABLET | Freq: Every day | ORAL | Status: DC
  Administered 2020-07-21 – 2020-07-22 (×2): 81 mg via ORAL
  Filled 2020-07-21 (×2): qty 1

## 2020-07-21 MED ORDER — FERRIC CITRATE 1 GM 210 MG(FE) PO TABS
630.0000 mg | ORAL_TABLET | Freq: Three times a day (TID) | ORAL | Status: DC
  Administered 2020-07-21 – 2020-07-24 (×9): 630 mg via ORAL
  Filled 2020-07-21 (×11): qty 3

## 2020-07-21 MED ORDER — DARBEPOETIN ALFA 100 MCG/0.5ML IJ SOSY
100.0000 ug | PREFILLED_SYRINGE | INTRAMUSCULAR | Status: DC
  Filled 2020-07-21: qty 0.5

## 2020-07-21 MED ORDER — CARVEDILOL 3.125 MG PO TABS: 3.1250 mg | ORAL_TABLET | Freq: Two times a day (BID) | ORAL | Status: DC

## 2020-07-21 MED ORDER — INSULIN ASPART 100 UNIT/ML ~~LOC~~ SOLN
0.0000 [IU] | Freq: Three times a day (TID) | SUBCUTANEOUS | Status: DC
  Administered 2020-07-21 (×2): 1 [IU] via SUBCUTANEOUS
  Administered 2020-07-22: 2 [IU] via SUBCUTANEOUS
  Administered 2020-07-23 (×2): 1 [IU] via SUBCUTANEOUS

## 2020-07-21 MED ORDER — AMLODIPINE BESYLATE 10 MG PO TABS
10.0000 mg | ORAL_TABLET | Freq: Every day | ORAL | Status: DC
  Administered 2020-07-21 – 2020-07-23 (×3): 10 mg via ORAL
  Filled 2020-07-21: qty 1
  Filled 2020-07-21: qty 2
  Filled 2020-07-21: qty 1

## 2020-07-21 MED ORDER — INSULIN ASPART 100 UNIT/ML ~~LOC~~ SOLN
0.0000 [IU] | Freq: Every day | SUBCUTANEOUS | Status: DC
  Administered 2020-07-21: 2 [IU] via SUBCUTANEOUS

## 2020-07-21 MED ORDER — RENA-VITE PO TABS
1.0000 | ORAL_TABLET | Freq: Every day | ORAL | Status: DC
  Administered 2020-07-22 – 2020-07-24 (×3): 1 via ORAL
  Filled 2020-07-21 (×4): qty 1

## 2020-07-21 MED ORDER — HEPARIN SODIUM (PORCINE) 5000 UNIT/ML IJ SOLN
5000.0000 [IU] | Freq: Three times a day (TID) | INTRAMUSCULAR | Status: DC
  Administered 2020-07-21 (×2): 5000 [IU] via SUBCUTANEOUS
  Filled 2020-07-21 (×2): qty 1
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
## 2020-07-21 NOTE — H&P (Addendum)
History and Physical  Melissa Howe WCB:[id number] DOB: 01-23-1951 DOA: 07/21/2020  Referring physician: Dr. Betsey Holiday PCP: Sonia Side., FNP  Outpatient Specialists: Nephrology Patient coming from: Home.  Chief Complaint: Shortness of breath  HPI: Melissa Howe is a 69 y.o. female with medical history significant for ESRD on hemodialysis Tuesday Thursday and Saturday, iron deficiency anemia, essential hypertension, hyperlipidemia, type 2 diabetes who presented to [facility] ED due to shortness of breath since the day prior to her presentation.  Associated with craving and eating a lot of ice chips.  No other changes in her diet.  Denies any chest pain or bilateral lower extremity tenderness.  She has been compliant with her hemodialysis.  Last session was on Thursday, 07/19/2020.  Her dialysis sessions are often cut short due to symptomatic hypotension.  Last night her dyspnea became worse and she decided to come to the ED for further evaluation and management.    In the ED, volume overload on exam.  Chest x-ray showed increase in pulmonary vascularity consistent with pulmonary edema.  Hypoxic, requiring 2 L to maintain O2 saturation greater than 90%.  Patient was admitted for volume overload requiring hemodialysis and new oxygen demand.  Likely will require home oxygen evaluation prior to discharge.  TOC consulted to assist with possible DME oxygen need.   ED Course: Vital signs significant for heart rate 132, respiration rate 36.  Elevated BNP greater than 1500.  Hemoglobin 7.1K.  Serum glucose elevated greater than 280.  Review of Systems: Review of systems as noted in the HPI. All other systems reviewed and are negative.   Past Medical History:  Diagnosis Date  . Chronic kidney disease   . Complication of anesthesia   . Constipation   . Diabetes mellitus    Type II  . Dyspnea    with exertion  . Hyperlipemia   . Hypertension   . Pneumonia 2019  . PONV (postoperative nausea and  vomiting)    x 1    Past Surgical History:  Procedure Laterality Date  . APPENDECTOMY    . AV FISTULA PLACEMENT Left 10/05/2019   Procedure: INSERTION OF ARTERIOVENOUS (AV) GORE-TEX VASCULAR GRAFT LEFT ARM;  Surgeon: [name], MD;  Location: [facility];  Service: Vascular;  Laterality: Left;  . BUBBLE STUDY  01/02/2020   Procedure: BUBBLE STUDY;  Surgeon: Elouise Munroe, MD;  Location: [facility] ENDOSCOPY;  Service: Cardiology;;  . EYE SURGERY Bilateral    Cataract  . FOREIGN BODY REMOVAL Left 05/13/2018   Procedure: FOREIGN BODY REMOVAL left foot;  Surgeon: Meredith Pel, MD;  Location: [facility];  Service: Orthopedics;  Laterality: Left;  . I & D EXTREMITY Left 05/21/2018   Procedure: LEFT FOOT DEBRIDEMENT AND WOUND CLOSURE;  Surgeon: Newt Minion, MD;  Location: [facility];  Service: Orthopedics;  Laterality: Left;  . TEE WITHOUT CARDIOVERSION N/A 01/02/2020   Procedure: TRANSESOPHAGEAL ECHOCARDIOGRAM (TEE);  Surgeon: Elouise Munroe, MD;  Location: [facility];  Service: Cardiology;  Laterality: N/A;  . TONSILLECTOMY    . TUBAL LIGATION      Social History:  reports that she quit smoking about 36 years ago. Her smoking use included cigarettes. She quit after 4.00 years of use. She has never used smokeless tobacco. She reports that she does not drink alcohol and does not use drugs.   No Known Allergies  Family History  Problem Relation Age of Onset  . Hypertension Mother   . Diabetes Mother   .  Hypertension Sister       Prior to Admission medications   Medication Sig Start Date End Date Taking? Authorizing Provider  acetaminophen (TYLENOL) 500 MG tablet Take 500 mg by mouth every 6 (six) hours as needed for moderate pain or headache.    [provider]  amLODipine (NORVASC) 10 MG tablet Take 1 tablet (10 mg total) by mouth daily. 08/11/19 12/29/19  Terrilee Croak, MD  aspirin EC 81 MG tablet Take 1 tablet (81 mg total) by mouth daily. 09/08/16   Orson Eva, MD  carvedilol  (COREG) 12.5 MG tablet Take 0.5 tablets (6.25 mg total) by mouth 2 (two) times daily with a meal. 08/11/19 12/29/19  Dahal, Marlowe Aschoff, MD  ferrous sulfate 325 (65 FE) MG tablet Take 325 mg by mouth daily.    [provider]  HYDROcodone-acetaminophen (NORCO/VICODIN) 5-325 MG tablet Take 1 tablet by mouth every 6 (six) hours as needed for moderate pain. Patient not taking: Reported on 12/29/2019 10/05/19   Gabriel Earing, PA-C  Insulin Detemir (LEVEMIR) 100 UNIT/ML Pen Inject 18 Units into the skin daily with breakfast. 08/11/19   Dahal, Marlowe Aschoff, MD  Insulin Pen Needle 32G X 4 MM MISC Use with insulin pen to dispense insulin as directed 09/08/16   Tat, Shanon Brow, MD  multivitamin (RENA-VIT) TABS tablet Take 1 tablet by mouth daily. 11/22/19   [provider]  pentoxifylline (TRENTAL) 400 MG CR tablet Take 1 tablet (400 mg total) by mouth 3 (three) times daily with meals. Patient not taking: Reported on 12/29/2019 06/10/18   Newt Minion, MD  rosuvastatin (CRESTOR) 20 MG tablet Take 1 tablet (20 mg total) by mouth daily. Patient not taking: Reported on 12/29/2019 08/11/19 12/29/19  Terrilee Croak, MD    Physical Exam: BP 139/75   Pulse 84   Temp 98.3 F (36.8 C) (Oral)   Resp 18   SpO2 100%   . General: 69 y.o. year-old female well developed well nourished in no acute distress.  Alert and oriented x3. . Cardiovascular: Regular rate and rhythm with no rubs or gallops.  No thyromegaly or JVD noted.  No lower extremity edema. 2/4 pulses in all 4 extremities. Marland Kitchen Respiratory: Clear to auscultation with no wheezes or rales. Good inspiratory effort. . Abdomen: Soft nontender nondistended with normal bowel sounds x4 quadrants. . Muskuloskeletal: No cyanosis, clubbing or edema noted bilaterally . Neuro: CN II-XII intact, strength, sensation, reflexes . Skin: No ulcerative lesions noted or rashes . Psychiatry: Judgement and insight appear normal. Mood is appropriate for condition and setting            Labs on Admission:  Basic Metabolic Panel: Recent Labs  Lab 07/21/20 0140 07/21/20 0153  NA 138 136  K 4.3 4.3  CL 94* 95*  CO2 29  --   GLUCOSE 285* 288*  BUN 22 27*  CREATININE 5.30* 5.80*  CALCIUM 9.9  --    Liver Function Tests: No results for input(s): AST, ALT, ALKPHOS, BILITOT, PROT, ALBUMIN in the last 168 hours. No results for input(s): LIPASE, AMYLASE in the last 168 hours. No results for input(s): AMMONIA in the last 168 hours. CBC: Recent Labs  Lab 07/21/20 0140 07/21/20 0153  WBC 10.2  --   NEUTROABS 5.6  --   HGB 7.0* 7.1*  HCT 23.1* 21.0*  MCV 100.4*  --   PLT 408*  --    Cardiac Enzymes: No results for input(s): CKTOTAL, CKMB, CKMBINDEX, TROPONINI in the last 168 hours.  BNP (  last 3 results) Recent Labs    08/05/19 1444 07/21/20 0140  BNP 471.4* 1,528.7*    ProBNP (last 3 results) No results for input(s): PROBNP in the last 8760 hours.  CBG: No results for input(s): GLUCAP in the last 168 hours.  Radiological Exams on Admission: DG Chest Port 1 View  Result Date: 07/21/2020 CLINICAL DATA:  Shortness of breath, difficulty breathing, dialysis patient EXAM: PORTABLE CHEST 1 VIEW COMPARISON:  Radiograph 12/29/2019 FINDINGS: Diffusely increased interstitial opacity with more hazy and patchy opacities towards the mid to lower lungs. The pulmonary vascularity is cephalized indistinct. Cardiac silhouette is slightly prominent though likely accentuated by portable technique. Cardiomediastinal contours and cells are not significantly changed from prior. No pneumothorax. There may be trace bilateral effusions with some thickening in the costophrenic sulci. No pneumothorax. No acute osseous or soft tissue abnormality. Degenerative changes are present in the imaged spine and shoulders. Telemetry leads overlie the chest. IMPRESSION: Features most compatible with volume overload with likely interstitial and alveolar pulmonary edema and trace bilateral  effusions. Electronically Signed   By: Lovena Le M.D.   On: 07/21/2020 02:40    EKG: I independently viewed the EKG done and my findings are as followed: Sinus tachycardia rate of 127.  Assessment/Plan Present on Admission: . Volume overload  Active Problems:   Volume overload  Volume overload in the setting of ?incomplete outpatient hemodialysis sessions and fluid restriction noncompliance. Reported by the patient-she excessively craves ice chips. Chest x-ray personally reviewed, showed increase in pulmonary vascularity, consistent with pulmonary edema. Contacted Dr. Marval Regal via secure chat Please consult nephrology officially to assist with hemodialysis.  Pulmonary edema suspect multifactorial secondary to ESRD fluid restriction noncompliance versus heart failure exacerbation Presented with acute dyspnea of less than a day duration Volume overload on exam with crackles on lung auscultation and bilateral lower extremity pitting edema. Volume status managed with hemodialysis- States she still makes urine.  Acute on chronic combined systolic and diastolic CHF suspect related to diet non compliance. Presented with BNP greater than 1500, pulmonary edema on chest x-ray, bilateral lower extremity edema. Last 2D echo done on December 30, 2019 showed LVEF 45 to 50% with global hypokinesis and grade 2 diastolic dysfunction. Strict I's and O's and daily weight Volume status addressed with hemodialysis.  Acute hypoxic respiratory failure secondary to pulmonary edema Not on oxygen supplementation at baseline Currently requiring 2 L to maintain O2 saturation greater than 92%. Obtain home O2 evaluation prior to discharge [facility] consulted for possible DME oxygen needs Incentive spirometer Hemodialysis  Hemodialysis induced hypotension May consider midodrine 3 times a week post hemodialysis Defer to nephrology.  ESRD on HD TTS Consult nephrology for hemodialysis  Type 2 diabetes with  hyperglycemia Obtain hemoglobin A1c Start insulin sliding scale Avoid hypoglycemia in the setting of ESRD.  Essential hypertension BP is not at goal Resume home oral antihypertensives as indicated if BP is stable post HD. Monitor vital signs, avoid hypotension  Anemia of chronic disease/iron deficiency anemia Baseline hemoglobin appears to be 9 Presented with hemoglobin of 7.1 No overt bleeding Obtain iron studies, FOBT, and monitor H&H    DVT prophylaxis: Subcu heparin 3 times daily  Code Status: Full code  Family Communication: None at bedside  Disposition Plan: Admit to telemetry medical  Consults called: Please consult nephrology  Admission status: Observation status   Status is: Observation    Dispo: The patient is from: Home.  Anticipated d/c is to: Home.               Anticipated d/c date is: 07/22/2020.              Patient currently not stable for discharge due to ongoing management for pulmonary edema and acute hypoxia.      Kayleen Memos MD [facility] Pager [phone]  If 7PM-7AM, please contact night-coverage [email or web address] Password [email or web address]  07/21/2020, 4:48 AM

## 2020-07-21 NOTE — ED Notes (Signed)
Pt resting with eyes closed, RR even and unlabored, NAD. VSS on 2L 02 by [place]

## 2020-07-21 NOTE — ED Notes (Signed)
Pt placed on 02 2L [place] for RA sat 88%

## 2020-07-21 NOTE — Evaluation (Signed)
Physical Therapy Evaluation Patient Details Name: Melissa Howe MRN: [id number] DOB: 1951/09/25 Today's Date: 07/21/2020   History of Present Illness  The pt is a 69 yo female presenting with SOB. PMH includes: ESRD on HD TTS, anemia, HTN, HLD, and DM II.  Clinical Impression  Pt in bed upon arrival of PT, agreeable to evaluation at this time. Prior to admission the pt was independent with all mobility, managing while home alone during the day while daughter at work, but with recent increase in SOB. The pt now presents with minor limitations in functional mobility and endurance, but is safe to return home with family support when medically cleared. The pt was able to demo good independence with bed mobility and transfers, as well as good hallway ambulation without use of AD and with SpO2 96-100% on RA. The pt denies SOB even after 200 ft of ambulation with good speed at this time. The pt was educated on energy conservation, use of pulse-ox for self-monitoring, PLB, and exercise program for walking and LE strengthening. The pt was agreeable to all education and has no further acute PT needs at this time. Thank you for the consult.       Follow Up Recommendations No PT follow up;Supervision - Intermittent    Equipment Recommendations  None recommended by PT    Recommendations for Other Services       Precautions / Restrictions Precautions Precaution Comments: low fall, SpO2 stable on RA Restrictions Weight Bearing Restrictions: No      Mobility  Bed Mobility Overal bed mobility: Independent                Transfers Overall transfer level: Needs assistance   Transfers: Sit to/from Stand Sit to Stand: Supervision         General transfer comment: supervision for safety and line management. 5xsts from chair in 16 sec, used UE on armrests but no assist to power up or steady  Ambulation/Gait Ambulation/Gait assistance: Min guard;Supervision Gait Distance (Feet): 60 Feet  (+ 200 ft) Assistive device: None Gait Pattern/deviations: Step-through pattern;WFL(Within Functional Limits) Gait velocity: 1.0 m/s Gait velocity interpretation: 1.31 - 2.62 ft/sec, indicative of limited community ambulator General Gait Details: initially mobilizing in room with minG, no LOB with normal walking, with transition to hallway ambulation, the pt was able to improve speed and stability, needing only supervision for safety, SpO2 96-100% on RA     Balance Overall balance assessment: Mild deficits observed, not formally tested                                           Pertinent Vitals/Pain Pain Assessment: No/denies pain    Home Living Family/patient expects to be discharged to:: Private residence Living Arrangements: Children Available Help at Discharge: Family;Available 24 hours/day Type of Home: House Home Access: Level entry     Home Layout: Two level Home Equipment: None Additional Comments: has been independent for all gait    Prior Function Level of Independence: Independent         Comments: one daughter works during the day, she functions on her own and is able to cook, drive, and navigate stairs without issue     Hand Dominance   Dominant Hand: Right    Extremity/Trunk Assessment   Upper Extremity Assessment Upper Extremity Assessment: Overall WFL for tasks assessed    Lower Extremity Assessment Lower  Extremity Assessment: Overall WFL for tasks assessed    Cervical / Trunk Assessment Cervical / Trunk Assessment: Normal  Communication   Communication: No difficulties  Cognition Arousal/Alertness: Awake/alert Behavior During Therapy: WFL for tasks assessed/performed Overall Cognitive Status: Within Functional Limits for tasks assessed                                        General Comments General comments (skin integrity, edema, etc.): VSS on RA        Assessment/Plan    PT Assessment Patent does  not need any further PT services         PT Goals (Current goals can be found in the Care Plan section)  Acute Rehab PT Goals Patient Stated Goal: return home PT Goal Formulation: With patient Time For Goal Achievement: 08/04/20 Potential to Achieve Goals: Good     AM-PAC PT "6 Clicks" Mobility  Outcome Measure Help needed turning from your back to your side while in a flat bed without using bedrails?: None Help needed moving from lying on your back to sitting on the side of a flat bed without using bedrails?: None Help needed moving to and from a bed to a chair (including a wheelchair)?: A Little Help needed standing up from a chair using your arms (e.g., wheelchair or bedside chair)?: None Help needed to walk in hospital room?: A Little Help needed climbing 3-5 steps with a railing? : A Little 6 Click Score: 21    End of Session Equipment Utilized During Treatment: Gait belt Activity Tolerance: Patient tolerated treatment well Patient left: in bed;with call bell/phone within reach (sitting EOB) Nurse Communication: Mobility status PT Visit Diagnosis: Difficulty in walking, not elsewhere classified (R26.2)    Time: 3361-2244 PT Time Calculation (min) (ACUTE ONLY): 31 min   Charges:   PT Evaluation $PT Eval Low Complexity: 1 Low PT Treatments $Gait Training: 8-22 mins        Karma Ganja, PT, DPT   Acute Rehabilitation Department Pager #: [phone]  Otho Bellows 07/21/2020, 10:03 AM

## 2020-07-21 NOTE — Progress Notes (Signed)
Pt stated she has not been wearing BiPAP.

## 2020-07-21 NOTE — ED Triage Notes (Signed)
Pt is a dialysis patient and she has been having a hard time breathing today. Pt is suppose to go to dialysis on Tuesday, Thursday, Saturday. Pt said she goes to dialysis and they never get enough fluid off bc it makes her sick. Pt is working very hard to breath, Pt's sats are at 92 to 93 RA. Pt is nauseated in triage and actively vomiting. No fevers, no chills.

## 2020-07-21 NOTE — Consult Note (Signed)
[facility] Renal Consultation Note    Indication for Consultation:  Management of ESRD/hemodialysis; anemia, hypertension/volume and secondary hyperparathyroidism PCP: Viviann Spare FNP  HPI: Melissa Howe is a 69 y.o. female with ESRD  On TTS dialysis at [facility] since January 2021 with hx DM, HTN, MSSA bacteremia who presented with worsening SOB, DOE, dim functional status.  She is compliant with dialysis and gets close to EDW it has been raised recently but still about 1 kg above.  Review of oupatient Hgbs show a baseline hgb of 10 - 11 on 30 - 60 Mircera q 2-4 weeks but then a notable drop to 6.7 9/16, 6.7 9/21 and 6.7 9/23 without a concomitant ^ in [place].  Last Mircera dose was 30 on 9/23, her last HD treatment.  Prior to that tsat was 30%. She had done stool cards(which she left at home) and was to have returned to dialysis today. Stools are always dark due to Fe based phosphate binder, but recent stool has been blacker than usual.  She has had problems with constipation. No N, V, epigastric pain. + orthopnea.  She has never had a colonoscopy.  Evaluation in the ED showed K 4.3 glu 288 hgb 7.0 MCV 100.4  WBC 10.2 plts 408 COVID neg, VSS and CXR mild fluid overload with tr bilateral effusions. Type and screen ordered has been ordered. Patient has been admitted to OBS status.  Past Medical History:  Diagnosis Date  . Chronic kidney disease   . Complication of anesthesia   . Constipation   . Diabetes mellitus    Type II  . Dyspnea    with exertion  . Hyperlipemia   . Hypertension   . Pneumonia 2019  . PONV (postoperative nausea and vomiting)    x 1    Past Surgical History:  Procedure Laterality Date  . APPENDECTOMY    . AV FISTULA PLACEMENT Left 10/05/2019   Procedure: INSERTION OF ARTERIOVENOUS (AV) GORE-TEX VASCULAR GRAFT LEFT ARM;  Surgeon: [name], MD;  Location: [place];  Service: Vascular;  Laterality: Left;  . BUBBLE STUDY  01/02/2020   Procedure: BUBBLE  STUDY;  Surgeon: Elouise Munroe, MD;  Location: [facility] ENDOSCOPY;  Service: Cardiology;;  . EYE SURGERY Bilateral    Cataract  . FOREIGN BODY REMOVAL Left 05/13/2018   Procedure: FOREIGN BODY REMOVAL left foot;  Surgeon: Meredith Pel, MD;  Location: [place];  Service: Orthopedics;  Laterality: Left;  . I & D EXTREMITY Left 05/21/2018   Procedure: LEFT FOOT DEBRIDEMENT AND WOUND CLOSURE;  Surgeon: Newt Minion, MD;  Location: [place];  Service: Orthopedics;  Laterality: Left;  . TEE WITHOUT CARDIOVERSION N/A 01/02/2020   Procedure: TRANSESOPHAGEAL ECHOCARDIOGRAM (TEE);  Surgeon: Elouise Munroe, MD;  Location: [place];  Service: Cardiology;  Laterality: N/A;  . TONSILLECTOMY    . TUBAL LIGATION     Family History  Problem Relation Age of Onset  . Hypertension Mother   . Diabetes Mother   . Hypertension Sister    Social History:  reports that she quit smoking about 36 years ago. Her smoking use included cigarettes. She quit after 4.00 years of use. She has never used smokeless tobacco. She reports that she does not drink alcohol and does not use drugs. No Known Allergies Prior to Admission medications   Medication Sig Start Date End Date Taking? Authorizing Provider  acetaminophen (TYLENOL) 500 MG tablet Take 500 mg by mouth every 6 (six) hours as  needed for moderate pain or headache.   Yes [provider]  amLODipine (NORVASC) 10 MG tablet Take 10 mg by mouth daily.   Yes [provider]  aspirin EC 81 MG tablet Take 1 tablet (81 mg total) by mouth daily. 09/08/16  Yes Tat, Shanon Brow, MD  AURYXIA 1 GM 210 MG(Fe) tablet Take 630 mg by mouth in the morning, at noon, and at bedtime.  06/18/20  Yes [provider]  Insulin Detemir (LEVEMIR) 100 UNIT/ML Pen Inject 18 Units into the skin daily with breakfast. Patient taking differently: Inject 18 Units into the skin at bedtime.  08/11/19  Yes Dahal, Marlowe Aschoff, MD  multivitamin (RENA-VIT) TABS tablet Take 1 tablet by  mouth daily. 11/22/19  Yes [provider]  Insulin Pen Needle 32G X 4 MM MISC Use with insulin pen to dispense insulin as directed 09/08/16   Tat, Shanon Brow, MD  rosuvastatin (CRESTOR) 20 MG tablet Take 1 tablet (20 mg total) by mouth daily. Patient not taking: Reported on 12/29/2019 08/11/19 12/29/19  Terrilee Croak, MD   Current Facility-Administered Medications  Medication Dose Route Frequency Provider Last Rate Last Admin  . Chlorhexidine Gluconate Cloth 2 % PADS 6 each  6 each Topical Q0600 Roney Jaffe, MD      . ferrous sulfate tablet 325 mg  325 mg Oral Daily Irene Pap N, DO   325 mg at 07/21/20 1010  . heparin injection 2,000 Units  2,000 Units Dialysis PRN Roney Jaffe, MD      . heparin injection 5,000 Units  5,000 Units Subcutaneous [address] N, DO   5,000 Units at 07/21/20 1009  . insulin aspart (novoLOG) injection 0-5 Units  0-5 Units Subcutaneous QHS Hall, Carole N, DO      . insulin aspart (novoLOG) injection 0-6 Units  0-6 Units Subcutaneous TID WC Hall, Carole N, DO      . ondansetron (ZOFRAN) injection 4 mg  4 mg Intravenous Q6H PRN Kayleen Memos, DO       Current Outpatient Medications  Medication Sig Dispense Refill  . acetaminophen (TYLENOL) 500 MG tablet Take 500 mg by mouth every 6 (six) hours as needed for moderate pain or headache.    Marland Kitchen amLODipine (NORVASC) 10 MG tablet Take 10 mg by mouth daily.    Marland Kitchen aspirin EC 81 MG tablet Take 1 tablet (81 mg total) by mouth daily. 30 tablet 0  . AURYXIA 1 GM 210 MG(Fe) tablet Take 630 mg by mouth in the morning, at noon, and at bedtime.     . Insulin Detemir (LEVEMIR) 100 UNIT/ML Pen Inject 18 Units into the skin daily with breakfast. (Patient taking differently: Inject 18 Units into the skin at bedtime. ) 15 mL 1  . multivitamin (RENA-VIT) TABS tablet Take 1 tablet by mouth daily.    . Insulin Pen Needle 32G X 4 MM MISC Use with insulin pen to dispense insulin as directed 100 each 1  . rosuvastatin (CRESTOR) 20  MG tablet Take 1 tablet (20 mg total) by mouth daily. (Patient not taking: Reported on 12/29/2019) 30 tablet 0   Labs: Basic Metabolic Panel: Recent Labs  Lab 07/21/20 0140 07/21/20 0153  NA 138 136  K 4.3 4.3  CL 94* 95*  CO2 29  --   GLUCOSE 285* 288*  BUN 22 27*  CREATININE 5.30* 5.80*  CALCIUM 9.9  --    Liver Function Tests: No results for input(s): AST, ALT, ALKPHOS, BILITOT, PROT, ALBUMIN in the last  168 hours. No results for input(s): LIPASE, AMYLASE in the last 168 hours. No results for input(s): AMMONIA in the last 168 hours. CBC: Recent Labs  Lab 07/21/20 0140 07/21/20 0153  WBC 10.2  --   NEUTROABS 5.6  --   HGB 7.0* 7.1*  HCT 23.1* 21.0*  MCV 100.4*  --   PLT 408*  --    Cardiac Enzymes: No results for input(s): CKTOTAL, CKMB, CKMBINDEX, TROPONINI in the last 168 hours. CBG: Recent Labs  Lab 07/21/20 0922  GLUCAP 86   Iron Studies: No results for input(s): IRON, TIBC, TRANSFERRIN, FERRITIN in the last 72 hours. Studies/Results: DG Chest Port 1 View  Result Date: 07/21/2020 CLINICAL DATA:  Shortness of breath, difficulty breathing, dialysis patient EXAM: PORTABLE CHEST 1 VIEW COMPARISON:  Radiograph 12/29/2019 FINDINGS: Diffusely increased interstitial opacity with more hazy and patchy opacities towards the mid to lower lungs. The pulmonary vascularity is cephalized indistinct. Cardiac silhouette is slightly prominent though likely accentuated by portable technique. Cardiomediastinal contours and cells are not significantly changed from prior. No pneumothorax. There may be trace bilateral effusions with some thickening in the costophrenic sulci. No pneumothorax. No acute osseous or soft tissue abnormality. Degenerative changes are present in the imaged spine and shoulders. Telemetry leads overlie the chest. IMPRESSION: Features most compatible with volume overload with likely interstitial and alveolar pulmonary edema and trace bilateral effusions. Electronically  Signed   By: Lovena Le M.D.   On: 07/21/2020 02:40    ROS: As per HPI otherwise negative.  Physical Exam: Vitals:   07/21/20 0615 07/21/20 0901 07/21/20 0956 07/21/20 1000  BP: 128/61 131/82  136/66  Pulse: 80 85  80  Resp: 17 18  18   Temp:  98.2 F (36.8 C)    TempSrc:  Oral    SpO2: 100% 98% 100% 100%     General: WDWN NAD breathing easily on room air Head: NCAT sclera not icteric MMM Neck: Supple.  Lungs: Dim BS at bases  Heart: RRR with S1 S2.  Abdomen: soft NT + BS Lower extremities: without edema or ischemic changes, no open wounds  Neuro: A & O  X 3. Moves all extremities spontaneously. Psych:  Responds to questions appropriately with a normal affect. Dialysis Access: left AVF + bruit  Dialysis Orders: TTS [facility] EDW 73.5 (74.6 last post wt) 2K 2 Ca 400/800 heparin 2200 Mircera 30 last 9/23 - just increased to 225 venofer 50/week - calcitriol 2.5 Aruixya 3 ac 1 with snacks most recent tsat 30%   Assessment/Plan: 1. SOB scecondary to mild volume excess and low hgb - plan transfusion 1 unit PRBC today on HD and UF to EDW or lower if BP allows 2. ESRD -  TTS -HD today hold heparin due to acute drop in hgb would rule out GIB via FOBT though has SQ heparin ordered 3. Hypertension/volume  -lower volume on HD as BP allows - get standing weights 4. Anemia  - hgb - noted acute drop since 6.7 from usual 10 - 11 range - transfuse as above- recheck Fe studies, FOBT ordered  - increase ESA to more moderate dose as baseline Mircera doses 30 - 60 continue weekly Fe 5. Metabolic bone disease -  contiue VDRA and binders 6. Nutrition - renal carb mod diet/vits 7. DM- per primary  Myriam Jacobson, PA-C [place] [phone] 07/21/2020, 11:39 AM

## 2020-07-21 NOTE — Progress Notes (Signed)
Same day note  Patient seen and examined at bedside.  Patient was admitted to the hospital for shortness of breath  At the time of my evaluation, patient complains of mild shortness of breath  Physical examination reveals peripheral edema,  Laboratory data and imaging was reviewed  Assessment and Plan.  Volume overload, new oxygen demand.  Chest x-ray with pulmonary edema Possibility of incomplete hemodialysis/ fluid restriction noncompliance.  Nephrology has been notified  ESRD on hemodialysis.  Tuesday Thursday Saturday regimen nephrology has been consulted  Acute on chronic combined congestive heart failure last known left ventricular ejection fraction of 45 to 60%.  Hemodialysis for volume management  Diabetes mellitus type II,Continue Lantus from home continue sliding scale insulin  Anemia chronic kidney disease.  Hemoglobin of 7.0.  Will benefit from PRBC transfusion during hemodialysis..  No Charge  Signed,  Delila Pereyra, MD [facility]

## 2020-07-22 DIAGNOSIS — I132 Hypertensive heart and chronic kidney disease with heart failure and with stage 5 chronic kidney disease, or end stage renal disease: Secondary | ICD-10-CM | POA: Diagnosis present

## 2020-07-22 DIAGNOSIS — E1140 Type 2 diabetes mellitus with diabetic neuropathy, unspecified: Secondary | ICD-10-CM | POA: Diagnosis present

## 2020-07-22 DIAGNOSIS — J9601 Acute respiratory failure with hypoxia: Secondary | ICD-10-CM | POA: Diagnosis present

## 2020-07-22 DIAGNOSIS — Z79899 Other long term (current) drug therapy: Secondary | ICD-10-CM | POA: Diagnosis not present

## 2020-07-22 DIAGNOSIS — E785 Hyperlipidemia, unspecified: Secondary | ICD-10-CM | POA: Diagnosis present

## 2020-07-22 DIAGNOSIS — D500 Iron deficiency anemia secondary to blood loss (chronic): Secondary | ICD-10-CM | POA: Diagnosis present

## 2020-07-22 DIAGNOSIS — D638 Anemia in other chronic diseases classified elsewhere: Secondary | ICD-10-CM | POA: Diagnosis present

## 2020-07-22 DIAGNOSIS — R0602 Shortness of breath: Secondary | ICD-10-CM | POA: Diagnosis present

## 2020-07-22 DIAGNOSIS — E1165 Type 2 diabetes mellitus with hyperglycemia: Secondary | ICD-10-CM | POA: Diagnosis present

## 2020-07-22 DIAGNOSIS — Z7982 Long term (current) use of aspirin: Secondary | ICD-10-CM | POA: Diagnosis not present

## 2020-07-22 DIAGNOSIS — Z992 Dependence on renal dialysis: Secondary | ICD-10-CM | POA: Diagnosis not present

## 2020-07-22 DIAGNOSIS — K635 Polyp of colon: Secondary | ICD-10-CM | POA: Diagnosis present

## 2020-07-22 DIAGNOSIS — Z794 Long term (current) use of insulin: Secondary | ICD-10-CM | POA: Diagnosis not present

## 2020-07-22 DIAGNOSIS — E1122 Type 2 diabetes mellitus with diabetic chronic kidney disease: Secondary | ICD-10-CM | POA: Diagnosis present

## 2020-07-22 DIAGNOSIS — I959 Hypotension, unspecified: Secondary | ICD-10-CM | POA: Diagnosis present

## 2020-07-22 DIAGNOSIS — N186 End stage renal disease: Secondary | ICD-10-CM | POA: Diagnosis present

## 2020-07-22 DIAGNOSIS — N2581 Secondary hyperparathyroidism of renal origin: Secondary | ICD-10-CM | POA: Diagnosis present

## 2020-07-22 DIAGNOSIS — E8770 Fluid overload, unspecified: Secondary | ICD-10-CM | POA: Diagnosis not present

## 2020-07-22 DIAGNOSIS — Z8249 Family history of ischemic heart disease and other diseases of the circulatory system: Secondary | ICD-10-CM | POA: Diagnosis not present

## 2020-07-22 DIAGNOSIS — D649 Anemia, unspecified: Secondary | ICD-10-CM

## 2020-07-22 DIAGNOSIS — I5043 Acute on chronic combined systolic (congestive) and diastolic (congestive) heart failure: Secondary | ICD-10-CM | POA: Diagnosis present

## 2020-07-22 DIAGNOSIS — K254 Chronic or unspecified gastric ulcer with hemorrhage: Secondary | ICD-10-CM | POA: Diagnosis present

## 2020-07-22 DIAGNOSIS — E1136 Type 2 diabetes mellitus with diabetic cataract: Secondary | ICD-10-CM | POA: Diagnosis present

## 2020-07-22 DIAGNOSIS — Z9119 Patient's noncompliance with other medical treatment and regimen: Secondary | ICD-10-CM | POA: Diagnosis not present

## 2020-07-22 DIAGNOSIS — Z833 Family history of diabetes mellitus: Secondary | ICD-10-CM | POA: Diagnosis not present

## 2020-07-22 DIAGNOSIS — E0841 Diabetes mellitus due to underlying condition with diabetic mononeuropathy: Secondary | ICD-10-CM | POA: Diagnosis not present

## 2020-07-22 DIAGNOSIS — Z87891 Personal history of nicotine dependence: Secondary | ICD-10-CM | POA: Diagnosis not present

## 2020-07-22 DIAGNOSIS — Z20822 Contact with and (suspected) exposure to covid-19: Secondary | ICD-10-CM | POA: Diagnosis present

## 2020-07-22 DIAGNOSIS — I10 Essential (primary) hypertension: Secondary | ICD-10-CM | POA: Diagnosis not present

## 2020-07-22 LAB — RENAL FUNCTION PANEL
Albumin: 2.8 g/dL — ABNORMAL LOW (ref 3.5–5.0)
Anion gap: 10 (ref 5–15)
BUN: 27 mg/dL — ABNORMAL HIGH (ref 8–23)
CO2: 31 mmol/L (ref 22–32)
Calcium: 9.4 mg/dL (ref 8.9–10.3)
Chloride: 97 mmol/L — ABNORMAL LOW (ref 98–111)
Creatinine, Ser: 6.26 mg/dL — ABNORMAL HIGH (ref 0.44–1.00)
GFR calc Af Amer: 7 mL/min — ABNORMAL LOW (ref >60)
GFR calc non Af Amer: 6 mL/min — ABNORMAL LOW (ref >60)
Glucose, Bld: 176 mg/dL — ABNORMAL HIGH (ref 70–99)
Phosphorus: 4.4 mg/dL (ref 2.5–4.6)
Potassium: 3.8 mmol/L (ref 3.5–5.1)
Sodium: 138 mmol/L (ref 135–145)

## 2020-07-22 LAB — PREPARE RBC (CROSSMATCH)

## 2020-07-22 LAB — GLUCOSE, CAPILLARY
Glucose-Capillary: 76 mg/dL (ref 70–99)
Glucose-Capillary: 120 mg/dL — ABNORMAL HIGH (ref 70–99)
Glucose-Capillary: 209 mg/dL — ABNORMAL HIGH (ref 70–99)
Glucose-Capillary: 199 mg/dL — ABNORMAL HIGH (ref 70–99)

## 2020-07-22 LAB — CBC
HCT: 19.1 % — ABNORMAL LOW (ref 36.0–46.0)
Hemoglobin: 5.7 g/dL — CL (ref 12.0–15.0)
MCH: 29.7 pg (ref 26.0–34.0)
MCHC: 29.8 g/dL — ABNORMAL LOW (ref 30.0–36.0)
MCV: 99.5 fL (ref 80.0–100.0)
Platelets: 350 10*3/uL (ref 150–400)
RBC: 1.92 MIL/uL — ABNORMAL LOW (ref 3.87–5.11)
RDW: 15.8 % — ABNORMAL HIGH (ref 11.5–15.5)
WBC: 8.3 10*3/uL (ref 4.0–10.5)
nRBC: 0 % (ref 0.0–0.2)

## 2020-07-22 LAB — HEMOGLOBIN AND HEMATOCRIT, BLOOD
HCT: 28.6 % — ABNORMAL LOW (ref 36.0–46.0)
Hemoglobin: 9.5 g/dL — ABNORMAL LOW (ref 12.0–15.0)

## 2020-07-22 MED ORDER — SODIUM CHLORIDE 0.9% IV SOLUTION: Freq: Once | INTRAVENOUS | Status: DC

## 2020-07-22 MED ORDER — DARBEPOETIN ALFA 100 MCG/0.5ML IJ SOSY
100.0000 ug | PREFILLED_SYRINGE | INTRAMUSCULAR | Status: DC
  Filled 2020-07-22: qty 0.5

## 2020-07-22 MED ORDER — POLYETHYLENE GLYCOL 3350 17 G PO PACK
17.0000 g | PACK | Freq: Every day | ORAL | Status: DC
  Administered 2020-07-22 – 2020-07-23 (×2): 17 g via ORAL
  Filled 2020-07-22 (×3): qty 1

## 2020-07-22 NOTE — Consult Note (Signed)
Reason for Consult: Anemia and reports of melena Referring Physician: [facility]  Conan Bowens Bruney HPI: This is a 69 year old female with a PMH of CKD on dialysis TTS, chronic anemia, hyperlipidemia, DM, and HTN admitted for fluid overload.  She complained about being short of breath, even though she is compliant with her dialysis.  There is a report that her dialysis sessions are cut short as a result of symptomatic hypotension.  In the ER she was found to be fluid overloaded and she was hypoxic.  The patient required 2 liters of oxygen via [place] to maintain saturations in the 90% range.  The patient's BNP was elevated at >1500. Renal provides the patient Dorthey Sawyer for her anemia and her last dose was on 07/19/2020.  She has an anemia and her HGB dropped down to 5.7 g/dL and her baseline ranges from 7-11 g/dL.  After transfusions her HGB is now at 9.5 g/dL.  Her iron profile shows that her iron saturation is at 17% and her ferritin is elevated at 1016.  The patient denies having a prior colonoscopy or endoscopy.  The FOBT is pending.  She does complain about constipation.  Past Medical History:  Diagnosis Date  . Chronic kidney disease   . Complication of anesthesia   . Constipation   . Diabetes mellitus    Type II  . Dyspnea    with exertion  . Hyperlipemia   . Hypertension   . Pneumonia 2019  . PONV (postoperative nausea and vomiting)    x 1     Past Surgical History:  Procedure Laterality Date  . APPENDECTOMY    . AV FISTULA PLACEMENT Left 10/05/2019   Procedure: INSERTION OF ARTERIOVENOUS (AV) GORE-TEX VASCULAR GRAFT LEFT ARM;  Surgeon: [name], MD;  Location: [place];  Service: Vascular;  Laterality: Left;  . BUBBLE STUDY  01/02/2020   Procedure: BUBBLE STUDY;  Surgeon: Elouise Munroe, MD;  Location: [facility] ENDOSCOPY;  Service: Cardiology;;  . EYE SURGERY Bilateral    Cataract  . FOREIGN BODY REMOVAL Left 05/13/2018   Procedure: FOREIGN BODY REMOVAL left foot;  Surgeon: Meredith Pel, MD;  Location: [place];  Service: Orthopedics;  Laterality: Left;  . I & D EXTREMITY Left 05/21/2018   Procedure: LEFT FOOT DEBRIDEMENT AND WOUND CLOSURE;  Surgeon: Newt Minion, MD;  Location: [place];  Service: Orthopedics;  Laterality: Left;  . TEE WITHOUT CARDIOVERSION N/A 01/02/2020   Procedure: TRANSESOPHAGEAL ECHOCARDIOGRAM (TEE);  Surgeon: Elouise Munroe, MD;  Location: [place];  Service: Cardiology;  Laterality: N/A;  . TONSILLECTOMY    . TUBAL LIGATION      Family History  Problem Relation Age of Onset  . Hypertension Mother   . Diabetes Mother   . Hypertension Sister     Social History:  reports that she quit smoking about 36 years ago. Her smoking use included cigarettes. She quit after 4.00 years of use. She has never used smokeless tobacco. She reports that she does not drink alcohol and does not use drugs.  Allergies: No Known Allergies  Medications:  Scheduled: . sodium chloride   Intravenous Once  . sodium chloride   Intravenous Once  . amLODipine  10 mg Oral Daily  . calcitRIOL  2.5 mcg Oral Q T,Th,Sa-HD  . Chlorhexidine Gluconate Cloth  6 each Topical Q0600  . [START ON 07/24/2020] darbepoetin (ARANESP) injection - DIALYSIS  100 mcg Intravenous Q Tue-HD  . ferric citrate  630 mg  Oral TID WC  . insulin aspart  0-5 Units Subcutaneous QHS  . insulin aspart  0-6 Units Subcutaneous TID WC  . insulin detemir  18 Units Subcutaneous QHS  . multivitamin  1 tablet Oral Daily   Continuous:   Results for orders placed or performed during the hospital encounter of 07/21/20 (from the past 24 hour(s))  ABO/Rh     Status: None   Collection Time: 07/21/20  1:27 PM  Result Value Ref Range   ABO/RH(D)      AB POS Performed at [facility] [address] [place], [place] [address]   Hemoglobin A1c     Status: Abnormal   Collection Time: 07/21/20  1:27 PM  Result Value Ref Range   Hgb A1c MFr Bld 8.7 (H) 4.8 - 5.6 %   Mean Plasma Glucose 202.99  mg/dL  Iron and TIBC     Status: None   Collection Time: 07/21/20  1:27 PM  Result Value Ref Range   Iron 45 28 - 170 ug/dL   TIBC 262 250 - 450 ug/dL   Saturation Ratios 17 10.4 - 31.8 %   UIBC 217 ug/dL  Folate, serum, performed at [facility] lab     Status: None   Collection Time: 07/21/20  1:27 PM  Result Value Ref Range   Folate 59.0 >5.9 ng/mL  Ferritin     Status: Abnormal   Collection Time: 07/21/20  1:27 PM  Result Value Ref Range   Ferritin 1,016 (H) 11 - 307 ng/mL  Glucose, capillary     Status: Abnormal   Collection Time: 07/21/20  4:25 PM  Result Value Ref Range   Glucose-Capillary 174 (H) 70 - 99 mg/dL  Glucose, capillary     Status: Abnormal   Collection Time: 07/21/20  9:22 PM  Result Value Ref Range   Glucose-Capillary 214 (H) 70 - 99 mg/dL  Renal function panel     Status: Abnormal   Collection Time: 07/22/20 12:49 AM  Result Value Ref Range   Sodium 138 135 - 145 mmol/L   Potassium 3.8 3.5 - 5.1 mmol/L   Chloride 97 (L) 98 - 111 mmol/L   CO2 31 22 - 32 mmol/L   Glucose, Bld 176 (H) 70 - 99 mg/dL   BUN 27 (H) 8 - 23 mg/dL   Creatinine, Ser 6.26 (H) 0.44 - 1.00 mg/dL   Calcium 9.4 8.9 - 10.3 mg/dL   Phosphorus 4.4 2.5 - 4.6 mg/dL   Albumin 2.8 (L) 3.5 - 5.0 g/dL   GFR calc non Af Amer 6 (L) >60 mL/min   GFR calc Af Amer 7 (L) >60 mL/min   Anion gap 10 5 - 15  CBC     Status: Abnormal   Collection Time: 07/22/20 12:50 AM  Result Value Ref Range   WBC 8.3 4.0 - 10.5 K/uL   RBC 1.92 (L) 3.87 - 5.11 MIL/uL   Hemoglobin 5.7 (LL) 12.0 - 15.0 g/dL   HCT 19.1 (L) 36 - 46 %   MCV 99.5 80.0 - 100.0 fL   MCH 29.7 26.0 - 34.0 pg   MCHC 29.8 (L) 30.0 - 36.0 g/dL   RDW 15.8 (H) 11.5 - 15.5 %   Platelets 350 150 - 400 K/uL   nRBC 0.0 0.0 - 0.2 %  Prepare RBC (crossmatch)     Status: None   Collection Time: 07/22/20  1:10 AM  Result Value Ref Range   Order Confirmation  ORDER PROCESSED BY BLOOD BANK Performed at [facility], [place] [address],  [place], [place] [address]   Prepare RBC (crossmatch)     Status: None   Collection Time: 07/22/20  1:11 AM  Result Value Ref Range   Order Confirmation      ORDER PROCESSED BY BLOOD BANK Performed at [facility], [address], [place], [place] [address]   Glucose, capillary     Status: None   Collection Time: 07/22/20  6:46 AM  Result Value Ref Range   Glucose-Capillary 76 70 - 99 mg/dL  Hemoglobin and hematocrit, blood     Status: Abnormal   Collection Time: 07/22/20 10:57 AM  Result Value Ref Range   Hemoglobin 9.5 (L) 12.0 - 15.0 g/dL   HCT 28.6 (L) 36 - 46 %  Glucose, capillary     Status: Abnormal   Collection Time: 07/22/20 11:13 AM  Result Value Ref Range   Glucose-Capillary 120 (H) 70 - 99 mg/dL     DG Chest Port 1 View  Result Date: 07/21/2020 CLINICAL DATA:  Shortness of breath, difficulty breathing, dialysis patient EXAM: PORTABLE CHEST 1 VIEW COMPARISON:  Radiograph 12/29/2019 FINDINGS: Diffusely increased interstitial opacity with more hazy and patchy opacities towards the mid to lower lungs. The pulmonary vascularity is cephalized indistinct. Cardiac silhouette is slightly prominent though likely accentuated by portable technique. Cardiomediastinal contours and cells are not significantly changed from prior. No pneumothorax. There may be trace bilateral effusions with some thickening in the costophrenic sulci. No pneumothorax. No acute osseous or soft tissue abnormality. Degenerative changes are present in the imaged spine and shoulders. Telemetry leads overlie the chest. IMPRESSION: Features most compatible with volume overload with likely interstitial and alveolar pulmonary edema and trace bilateral effusions. Electronically Signed   By: Lovena Le M.D.   On: 07/21/2020 02:40    ROS:  As stated above in the HPI otherwise negative.  Blood pressure 127/66, pulse 78, temperature 98.6 F (37 C), temperature source Oral, resp. rate 18, height 5\' 4"  (1.626 m), weight  77.2 kg, SpO2 100 %.    PE: Gen: NAD, Alert and Oriented HEENT:  [place]/AT, EOMI Neck: Supple, no LAD Lungs: CTA Bilaterally CV: RRR without M/G/R ABD: Soft, NTND, +BS Ext: No C/C/E  Assessment/Plan: 1) Anemia. 2) CKD. 3) DM. 4) Constipation.   The patient states that she had black formed stool for the past month.  She denies taking oral iron supplementation during this time period, but she does recall that her stools were dark with taking iron.  Her HGB only dropped at the time of this hospitalization and not before with the solid black stools.  It is not clear that she has a GI source for blood loss.  Her FOBT is pending as she reports having problems with constipation.  Plan: 1) Miralax 17 g/day. 2) Await FOBT. 3) [facility] to assume care in the AM.  Robi Mitter D 07/22/2020, 1:15 PM

## 2020-07-22 NOTE — Progress Notes (Addendum)
PROGRESS NOTE  [name] SJG:[id number] DOB: [birth year] DOA: 07/21/2020 PCP: Sonia Side., FNP   LOS: 0 days   Brief narrative: As per HPI,  Melissa Howe is a 69 y.o. female with medical history significant for ESRD on hemodialysis Tuesday, Thursday and Saturday, iron deficiency anemia, essential hypertension, hyperlipidemia, type 2 diabetes mellitus presented to [facility] ED due to shortness of breath since the day prior to her presentation.  This was associated with craving and eating a lot of ice chips.  She has been compliant with her hemodialysis.  Last session was on Thursday, 07/19/2020. In the ED, patient was noted to be volume overloaded on exam.  Chest x-ray showed increase in pulmonary vascularity consistent with pulmonary edema.  Patient was hypoxic, requiring 2 L to maintain O2 saturation greater than 90%.  Patient was admitted for volume overload requiring hemodialysis and new oxygen demand.  Likely will require home oxygen evaluation prior to discharge.  TOC consulted to assist with possible DME oxygen need.   Assessment/Plan:  Principal Problem:   Volume overload Active Problems:   Diabetic neuropathy (HCC)   Essential hypertension   End stage renal disease on dialysis (HCC)  Volume overload,  initially was on supplemental oxygen..  Chest x-ray with pulmonary edema. Possibility of incomplete hemodialysis/ fluid restriction noncompliance.  Nephrology on board for hemodialysis. Improved at this time. Currently on room air  Severe anemia. Patient complains of black stools for the last 1 month. Has not had EGD or colonoscopy in the past. Hemoglobin of 5.7 today.   Type and screen has been done for 2 units of packed RBC today.  Will transfuse.  Check FOBT.  Recent iron profile noted.  Ferritin 1016.  Iron saturation at 17. Will consult GI for further opinion/endoscopy evaluation. Hold aspirin for now. Patient has been getting iron supplementation as outpatient. Check  occult blood. Last bowel movement was 9/23  ESRD on hemodialysis.  on Tuesday, Thursday Saturday. Nephrology on board.  Acute on chronic combined congestive heart failure last known left ventricular ejection fraction of 45 to 60%.  Hemodialysis for volume management. Currently compensated.  Diabetes mellitus type II. Continue Lantus, sliding scale insulin.  Hemoglobin A1c of 8.7.   diabetic diet.  DVT prophylaxis: SCD, hold heparin subq    Code Status: Full code  Family Communication: None  Status is: Observation  The patient will require care spanning > 2 midnights and should be moved to inpatient because: Inpatient level of care appropriate due to severity of illness and Severe anemia requiring transfusion, likely need endoscopic evaluation.  Dispo:  Patient From: Home  Planned Disposition: Home  Expected discharge date: 2 days  Medically stable for discharge: No   Consultants:  Nephrology  Procedures:  PRBC transfusion-plan for today  Antibiotics:  . None Anti-infectives (From admission, onward)   None      Subjective: Today, patient was seen and examined at bedside. No shortness of breath, chest pain, palpitation. Patient states that she has been having black stools for the last month or so. Previously has been told to do endoscopy colonoscopy but has not been doing it. Denies any hematemesis, abdominal pain, nausea or vomiting.  Objective: Vitals:   07/22/20 0422 07/22/20 0450  BP: 128/68 139/62  Pulse: 77 75  Resp: 16 16  Temp: 98.3 F (36.8 C) 98.5 F (36.9 C)  SpO2: 100% 100%    Intake/Output Summary (Last 24 hours) at 07/22/2020 0901 Last data filed at 07/22/2020 0600 Gross  per 24 hour  Intake 580 ml  Output 1770 ml  Net -1190 ml   Filed Weights   07/21/20 1446  Weight: 77.2 kg   Body mass index is 29.21 kg/m.   Physical Exam:  GENERAL: Patient is alert awake and oriented. Not in obvious distress. HENT: Mild pallor noted. Pupils  equally reactive to light. Oral mucosa is moist NECK: is supple, no gross swelling noted. CHEST: Clear to auscultation.   Diminished breath sounds bilaterally. CVS: S1 and S2 heard, no murmur. Regular rate and rhythm.  ABDOMEN: Soft, non-tender, bowel sounds are present. EXTREMITIES: No edema. CNS: Cranial nerves are intact. No focal motor deficits. SKIN: warm and dry without rashes.  Data Review: I have personally reviewed the following laboratory data and studies,  CBC: Recent Labs  Lab 07/21/20 0140 07/21/20 0153 07/22/20 0050  WBC 10.2  --  8.3  NEUTROABS 5.6  --   --   HGB 7.0* 7.1* 5.7*  HCT 23.1* 21.0* 19.1*  MCV 100.4*  --  99.5  PLT 408*  --  025   Basic Metabolic Panel: Recent Labs  Lab 07/21/20 0140 07/21/20 0153 07/22/20 0049  NA 138 136 138  K 4.3 4.3 3.8  CL 94* 95* 97*  CO2 29  --  31  GLUCOSE 285* 288* 176*  BUN 22 27* 27*  CREATININE 5.30* 5.80* 6.26*  CALCIUM 9.9  --  9.4  PHOS  --   --  4.4   Liver Function Tests: Recent Labs  Lab 07/22/20 0049  ALBUMIN 2.8*   No results for input(s): LIPASE, AMYLASE in the last 168 hours. No results for input(s): AMMONIA in the last 168 hours. Cardiac Enzymes: No results for input(s): CKTOTAL, CKMB, CKMBINDEX, TROPONINI in the last 168 hours. BNP (last 3 results) Recent Labs    08/05/19 1444 07/21/20 0140  BNP 471.4* 1,528.7*    ProBNP (last 3 results) No results for input(s): PROBNP in the last 8760 hours.  CBG: Recent Labs  Lab 07/21/20 0922 07/21/20 1215 07/21/20 1625 07/21/20 2122 07/22/20 0646  GLUCAP 86 157* 174* 214* 76   Recent Results (from the past 240 hour(s))  Respiratory Panel by RT PCR (Flu A&B, Covid) - Nasopharyngeal Swab     Status: None   Collection Time: 07/21/20  1:47 AM   Specimen: Nasopharyngeal Swab  Result Value Ref Range Status   SARS Coronavirus 2 by RT PCR NEGATIVE NEGATIVE Final    Comment: (NOTE) SARS-CoV-2 target nucleic acids are NOT DETECTED.  The  SARS-CoV-2 RNA is generally detectable in upper respiratoy specimens during the acute phase of infection. The lowest concentration of SARS-CoV-2 viral copies this assay can detect is 131 copies/mL. A negative result does not preclude SARS-Cov-2 infection and should not be used as the sole basis for treatment or other patient management decisions. A negative result may occur with  improper specimen collection/handling, submission of specimen other than nasopharyngeal swab, presence of viral mutation(s) within the areas targeted by this assay, and inadequate number of viral copies (<131 copies/mL). A negative result must be combined with clinical observations, patient history, and epidemiological information. The expected result is Negative.  Fact Sheet for Patients:  PinkCheek.be  Fact Sheet for Healthcare Providers:  GravelBags.it  This test is no t yet approved or cleared by the [place] FDA and  has been authorized for detection and/or diagnosis of SARS-CoV-2 by FDA under an Emergency Use Authorization (EUA). This EUA will remain  in effect (meaning this test  can be used) for the duration of the COVID-19 declaration under Section 564(b)(1) of the Act, 21 U.S.C. section 360bbb-3(b)(1), unless the authorization is terminated or revoked sooner.     Influenza A by PCR NEGATIVE NEGATIVE Final   Influenza B by PCR NEGATIVE NEGATIVE Final    Comment: (NOTE) The Xpert Xpress SARS-CoV-2/FLU/RSV assay is intended as an aid in  the diagnosis of influenza from Nasopharyngeal swab specimens and  should not be used as a sole basis for treatment. Nasal washings and  aspirates are unacceptable for Xpert Xpress SARS-CoV-2/FLU/RSV  testing.  Fact Sheet for Patients: PinkCheek.be  Fact Sheet for Healthcare Providers: GravelBags.it  This test is not yet approved or cleared  by the [place] FDA and  has been authorized for detection and/or diagnosis of SARS-CoV-2 by  FDA under an Emergency Use Authorization (EUA). This EUA will remain  in effect (meaning this test can be used) for the duration of the  Covid-19 declaration under Section 564(b)(1) of the Act, 21  U.S.C. section 360bbb-3(b)(1), unless the authorization is  terminated or revoked. Performed at [facility], [place] [address] [place], [place] [address]      Studies: DG Chest Port 1 View  Result Date: 07/21/2020 CLINICAL DATA:  Shortness of breath, difficulty breathing, dialysis patient EXAM: PORTABLE CHEST 1 VIEW COMPARISON:  Radiograph 12/29/2019 FINDINGS: Diffusely increased interstitial opacity with more hazy and patchy opacities towards the mid to lower lungs. The pulmonary vascularity is cephalized indistinct. Cardiac silhouette is slightly prominent though likely accentuated by portable technique. Cardiomediastinal contours and cells are not significantly changed from prior. No pneumothorax. There may be trace bilateral effusions with some thickening in the costophrenic sulci. No pneumothorax. No acute osseous or soft tissue abnormality. Degenerative changes are present in the imaged spine and shoulders. Telemetry leads overlie the chest. IMPRESSION: Features most compatible with volume overload with likely interstitial and alveolar pulmonary edema and trace bilateral effusions. Electronically Signed   By: Lovena Le M.D.   On: 07/21/2020 02:40      Flora Lipps, MD  [facility] 07/22/2020

## 2020-07-22 NOTE — Progress Notes (Addendum)
[facility] Progress Note   Dialysis Orders: TTS [facility] EDW 73.5 (74.6 last post wt) 2K 2 Ca 400/800 heparin 2200 Mircera 30 last 9/23 - just increased to 225 venofer 50/week - calcitriol 2.5 Aruixya 3 ac 1 with snacks most recent tsat 30% left lower AVF  Assessment/Plan: 1. SOB secondary to mild volume excess and low hgb - transfused 2 units yesterday; feels less SOB - not much volume off with HD. CXR findings worse than clinical appearance. SOB resolved, breathing back to normal per pt w/ only 1700 cc off yesterday. Some of CXR interstitial changes may be chronic. Will repeat today.  2. ESRD -  TTS -HD finished early am held heparin due to acute drop in hgb would rule out GIB via FOBT - but did get SQ heparin. Next HD Tuesday unless we need further transfusions, then would transfusion on HD to get volume off.    3.  Hypertension/volume  -net UF only 1.8 L liekly due to transfusion - need standing wts for accuracy   4.  Anemia  - hgb - noted acute drop to  6.7 since 9/16 also 6.7 9/21 and 9/23 from outpatient labs. Down  from usual 10 - 11 range - transfused as above- recheck Fe studies tsat dropped form 30 > 17% ferritin 1016 folate high, FOBT ordered.  Received 2 units PRBC but repeat hgb this am 5.7  pre HD - increased ESA to more moderate dose as baseline Mircera doses were 30 - 60 continue weekly Fe - ESA not given with HD - rescheduled for Tuesday -  Post transfusion hgb ordered - not drawn yetnow that she has had 2 units PRBC - repeat CBC in am. Has never had a colonoscopy. 5.  Metabolic bone disease -  contiue VDRA and binders Ca/P ok 6.  Nutrition - renal carb mod diet/vits 7.  DM- per primary  Myriam Jacobson, PA-C [place] [phone] 07/22/2020,8:28 AM  LOS: 0 days   Pt seen, examined and agree w assess/plan as above with additions as indicated.  [facility] 07/22/2020, 1:14 PM     Subjective:  Denies problems with  dialysis in the early morning today.  Couldn't sleep on HD because she was nervous. Breathing easily. No pain.  Objective Vitals:   07/22/20 0330 07/22/20 0356 07/22/20 0422 07/22/20 0450  BP: 129/66 139/67 128/68 139/62  Pulse: 77 79 77 75  Resp: 14 16 16 16   Temp:  98.2 F (36.8 C) 98.3 F (36.8 C) 98.5 F (36.9 C)  TempSrc:  Oral Oral Oral  SpO2: 100% 100% 100% 100%  Weight:      Height:       Physical Exam General: NAD Heart: RRR Lungs: grossly clear Abdomen: soft NT Extremities: no LE edema Dialysis Access:  Left lower AVF + bruit   Additional Objective Labs: Basic Metabolic Panel: Recent Labs  Lab 07/21/20 0140 07/21/20 0153 07/22/20 0049  NA 138 136 138  K 4.3 4.3 3.8  CL 94* 95* 97*  CO2 29  --  31  GLUCOSE 285* 288* 176*  BUN 22 27* 27*  CREATININE 5.30* 5.80* 6.26*  CALCIUM 9.9  --  9.4  PHOS  --   --  4.4   Liver Function Tests: Recent Labs  Lab 07/22/20 0049  ALBUMIN 2.8*   No results for input(s): LIPASE, AMYLASE in the last 168 hours. CBC: Recent Labs  Lab 07/21/20 0140 07/21/20 0153 07/22/20 0050  WBC  10.2  --  8.3  NEUTROABS 5.6  --   --   HGB 7.0* 7.1* 5.7*  HCT 23.1* 21.0* 19.1*  MCV 100.4*  --  99.5  PLT 408*  --  350   Blood Culture    Component Value Date/Time   SDES BLOOD RT INDEX FINGER 12/31/2019 0539   SPECREQUEST  12/31/2019 0539    BOTTLES DRAWN AEROBIC AND ANAEROBIC Blood Culture adequate volume   CULT  12/31/2019 0539    NO GROWTH 5 DAYS Performed at [facility], [place] [address], [place], [place] [address]    REPTSTATUS 01/05/2020 FINAL 12/31/2019 0539    Cardiac Enzymes: No results for input(s): CKTOTAL, CKMB, CKMBINDEX, TROPONINI in the last 168 hours. CBG: Recent Labs  Lab 07/21/20 0922 07/21/20 1215 07/21/20 1625 07/21/20 2122 07/22/20 0646  GLUCAP 86 157* 174* 214* 76   Iron Studies:  Recent Labs    07/21/20 1327  IRON 45  TIBC 262  FERRITIN 1,016*   Lab Results  Component Value  Date   INR 1.1 12/29/2019   INR 0.98 07/13/2015   Studies/Results: DG Chest Port 1 View  Result Date: 07/21/2020 CLINICAL DATA:  Shortness of breath, difficulty breathing, dialysis patient EXAM: PORTABLE CHEST 1 VIEW COMPARISON:  Radiograph 12/29/2019 FINDINGS: Diffusely increased interstitial opacity with more hazy and patchy opacities towards the mid to lower lungs. The pulmonary vascularity is cephalized indistinct. Cardiac silhouette is slightly prominent though likely accentuated by portable technique. Cardiomediastinal contours and cells are not significantly changed from prior. No pneumothorax. There may be trace bilateral effusions with some thickening in the costophrenic sulci. No pneumothorax. No acute osseous or soft tissue abnormality. Degenerative changes are present in the imaged spine and shoulders. Telemetry leads overlie the chest. IMPRESSION: Features most compatible with volume overload with likely interstitial and alveolar pulmonary edema and trace bilateral effusions. Electronically Signed   By: Lovena Le M.D.   On: 07/21/2020 02:40   Medications:  . sodium chloride   Intravenous Once  . sodium chloride   Intravenous Once  . amLODipine  10 mg Oral Daily  . aspirin EC  81 mg Oral Daily  . calcitRIOL  2.5 mcg Oral Q T,Th,Sa-HD  . Chlorhexidine Gluconate Cloth  6 each Topical Q0600  . [START ON 07/24/2020] darbepoetin (ARANESP) injection - DIALYSIS  100 mcg Intravenous Q Tue-HD  . ferric citrate  630 mg Oral TID WC  . insulin aspart  0-5 Units Subcutaneous QHS  . insulin aspart  0-6 Units Subcutaneous TID WC  . insulin detemir  18 Units Subcutaneous QHS  . multivitamin  1 tablet Oral Daily

## 2020-07-22 NOTE — Progress Notes (Signed)
   07/22/20 0422  Hand-Off documentation  Handoff Given Given to shift RN/LPN  Report given to (Full Name) Maree Erie, RN  Handoff Received Received from shift RN/LPN  Report received from (Full Name) Ahri Olson  Vital Signs  Temp 98.3 F (36.8 C)  Temp Source Oral  Pulse Rate 77  Pulse Rate Source Monitor  Resp 16  BP 128/68  BP Location Right Arm  BP Method Automatic  Patient Position (if appropriate) Lying  Oxygen Therapy  SpO2 100 %  O2 Device Room Air  Pain Assessment  Pain Scale 0-10  Pain Score 0  Post-Hemodialysis Assessment  Rinseback Volume (mL) 250 mL  KECN 206 V  Dialyzer Clearance Lightly streaked  Duration of HD Treatment -hour(s) 3.5 hour(s)  Hemodialysis Intake (mL) 1130 mL  UF Total -Machine (mL) 2900 mL  Net UF (mL) 1770 mL  Tolerated HD Treatment Yes  Post-Hemodialysis Comments tx achieved as expected, tolerated well, a/ox4, verbally resposive, no compaints. 2 units vof rRBC administered.  AVG/AVF Arterial Site Held (minutes) 10 minutes  AVG/AVF Venous Site Held (minutes) 10 minutes  Education / Care Plan  Dialysis Education Provided Yes  Documented Education in Care Plan Yes  Outpatient Plan of Care Reviewed and on Chart Yes  Note  Observations stable  Fistula / Graft Left Forearm Arteriovenous vein graft  Placement Date/Time: 10/05/19 1224   Placed prior to admission: No  Orientation: Left  Access Location: Forearm  Access Type: Arteriovenous vein graft  Site Condition No complications  Fistula / Graft Assessment Present;Thrill;Bruit  Status Deaccessed  Drainage Description None

## 2020-07-22 NOTE — Progress Notes (Signed)
Patient resting comfortably on RA with no respiratory distress noted. BIPAP not needed at this time. RT will monitor as needed.

## 2020-07-23 ENCOUNTER — Inpatient Hospital Stay (HOSPITAL_COMMUNITY): Payer: Medicare Other

## 2020-07-23 DIAGNOSIS — E0841 Diabetes mellitus due to underlying condition with diabetic mononeuropathy: Secondary | ICD-10-CM

## 2020-07-23 DIAGNOSIS — I10 Essential (primary) hypertension: Secondary | ICD-10-CM

## 2020-07-23 DIAGNOSIS — E8770 Fluid overload, unspecified: Secondary | ICD-10-CM

## 2020-07-23 LAB — BPAM RBC
Blood Product Expiration Date: 202109262359
Blood Product Expiration Date: 202110132359
ISSUE DATE / TIME: 202109260124
ISSUE DATE / TIME: 202109260124
Unit Type and Rh: 8400
Unit Type and Rh: 8400

## 2020-07-23 LAB — TYPE AND SCREEN
ABO/RH(D): AB POS
Antibody Screen: NEGATIVE
Unit division: 0
Unit division: 0

## 2020-07-23 LAB — CBC
HCT: 30.3 % — ABNORMAL LOW (ref 36.0–46.0)
Hemoglobin: 9.9 g/dL — ABNORMAL LOW (ref 12.0–15.0)
MCH: 30.4 pg (ref 26.0–34.0)
MCHC: 32.7 g/dL (ref 30.0–36.0)
MCV: 92.9 fL (ref 80.0–100.0)
Platelets: 367 10*3/uL (ref 150–400)
RBC: 3.26 MIL/uL — ABNORMAL LOW (ref 3.87–5.11)
RDW: 16.7 % — ABNORMAL HIGH (ref 11.5–15.5)
WBC: 8.8 10*3/uL (ref 4.0–10.5)
nRBC: 0.2 % (ref 0.0–0.2)

## 2020-07-23 LAB — BASIC METABOLIC PANEL
Anion gap: 10 (ref 5–15)
BUN: 24 mg/dL — ABNORMAL HIGH (ref 8–23)
CO2: 26 mmol/L (ref 22–32)
Calcium: 9.3 mg/dL (ref 8.9–10.3)
Chloride: 100 mmol/L (ref 98–111)
Creatinine, Ser: 4.96 mg/dL — ABNORMAL HIGH (ref 0.44–1.00)
GFR calc Af Amer: 10 mL/min — ABNORMAL LOW (ref >60)
GFR calc non Af Amer: 8 mL/min — ABNORMAL LOW (ref >60)
Glucose, Bld: 53 mg/dL — ABNORMAL LOW (ref 70–99)
Potassium: 3.9 mmol/L (ref 3.5–5.1)
Sodium: 136 mmol/L (ref 135–145)

## 2020-07-23 LAB — GLUCOSE, CAPILLARY
Glucose-Capillary: 101 mg/dL — ABNORMAL HIGH (ref 70–99)
Glucose-Capillary: 46 mg/dL — ABNORMAL LOW (ref 70–99)
Glucose-Capillary: 191 mg/dL — ABNORMAL HIGH (ref 70–99)
Glucose-Capillary: 155 mg/dL — ABNORMAL HIGH (ref 70–99)
Glucose-Capillary: 168 mg/dL — ABNORMAL HIGH (ref 70–99)

## 2020-07-23 MED ORDER — PEG-KCL-NACL-NASULF-NA ASC-C 100 G PO SOLR
0.5000 | Freq: Once | ORAL | Status: AC
  Administered 2020-07-24: 100 g via ORAL

## 2020-07-23 MED ORDER — PEG-KCL-NACL-NASULF-NA ASC-C 100 G PO SOLR: 1.0000 | Freq: Two times a day (BID) | ORAL | Status: DC

## 2020-07-23 MED ORDER — CHLORHEXIDINE GLUCONATE CLOTH 2 % EX PADS: 6.0000 | MEDICATED_PAD | Freq: Every day | CUTANEOUS | Status: DC

## 2020-07-23 MED ORDER — INSULIN DETEMIR 100 UNIT/ML ~~LOC~~ SOLN
14.0000 [IU] | Freq: Every day | SUBCUTANEOUS | Status: DC
  Administered 2020-07-23: 14 [IU] via SUBCUTANEOUS
  Filled 2020-07-23 (×2): qty 0.14

## 2020-07-23 MED ORDER — PEG-KCL-NACL-NASULF-NA ASC-C 100 G PO SOLR
0.5000 | Freq: Once | ORAL | Status: AC
  Administered 2020-07-23: 100 g via ORAL
  Filled 2020-07-23: qty 1

## 2020-07-23 NOTE — Progress Notes (Signed)
Inpatient Diabetes Program Recommendations  AACE/ADA: New Consensus Statement on Inpatient Glycemic Control   Target Ranges:  Prepandial:   less than 140 mg/dL      Peak postprandial:   less than 180 mg/dL (1-2 hours)      Critically ill patients:  140 - 180 mg/dL   Results for Melissa Howe, Melissa Howe (MRN [id number]) as of 07/23/2020 09:29  Ref. Range 07/22/2020 06:46 07/22/2020 11:13 07/22/2020 16:20 07/22/2020 21:16 07/23/2020 06:50 07/23/2020 07:23  Glucose-Capillary Latest Ref Range: 70 - 99 mg/dL 76 120 (H) 209 (H) 199 (H) 46 (L) 101 (H)   Review of Glycemic Control  Diabetes history: DM2 Outpatient Diabetes medications: Levemir 18 units QHS Current orders for Inpatient glycemic control: Levemir 18 units QHS, Novolog 0-6 units TID with meals, Novolog 0-5 units QHS  Inpatient Diabetes Program Recommendations:    Insulin: Please consider decreasing Levemir to 14 units QHS.  Thanks, Barnie Alderman, RN, MSN, CDE Diabetes Coordinator Inpatient Diabetes Program [phone] (Team Pager from 8am to 5pm)

## 2020-07-23 NOTE — H&P (View-Only) (Signed)
[facility] Progress Note    Since last GI note: No overt GI bleeding.  No abd pains.  Feels pretty well overall.  Objective: Vital signs in last 24 hours: Temp:  [97.6 F (36.4 C)-98.6 F (37 C)] 97.6 F (36.4 C) (09/27 0948) Pulse Rate:  [79-93] 81 (09/27 0948) Resp:  [18] 18 (09/27 0948) BP: (131-170)/(70-83) 153/76 (09/27 0948) SpO2:  [100 %] 100 % (09/27 0948) Last BM Date: 07/19/20 General: alert and oriented times 3 Heart: regular rate and rythm Abdomen: soft, non-tender, non-distended, normal bowel sounds   Lab Results: Recent Labs    07/21/20 0140 07/21/20 0153 07/22/20 0050 07/22/20 1057 07/23/20 0548  WBC 10.2  --  8.3  --  8.8  HGB 7.0*   < > 5.7* 9.5* 9.9*  PLT 408*  --  350  --  367  MCV 100.4*  --  99.5  --  92.9   < > = values in this interval not displayed.   Recent Labs    07/21/20 0140 07/21/20 0140 07/21/20 0153 07/22/20 0049 07/23/20 0548  NA 138   < > 136 138 136  K 4.3   < > 4.3 3.8 3.9  CL 94*   < > 95* 97* 100  CO2 29  --   --  31 26  GLUCOSE 285*   < > 288* 176* 53*  BUN 22   < > 27* 27* 24*  CREATININE 5.30*   < > 5.80* 6.26* 4.96*  CALCIUM 9.9  --   --  9.4 9.3   < > = values in this interval not displayed.   Recent Labs    07/22/20 0049  ALBUMIN 2.8*   No results for input(s): INR in the last 72 hours.   Studies/Results: No results found.   Medications: Scheduled Meds: . sodium chloride   Intravenous Once  . sodium chloride   Intravenous Once  . amLODipine  10 mg Oral Daily  . calcitRIOL  2.5 mcg Oral Q T,Th,Sa-HD  . Chlorhexidine Gluconate Cloth  6 each Topical Q0600  . [START ON 07/24/2020] darbepoetin (ARANESP) injection - DIALYSIS  100 mcg Intravenous Q Tue-HD  . ferric citrate  630 mg Oral TID WC  . insulin aspart  0-5 Units Subcutaneous QHS  . insulin aspart  0-6 Units Subcutaneous TID WC  . insulin detemir  18 Units Subcutaneous QHS  . multivitamin  1 tablet Oral Daily  . polyethylene glycol   17 g Oral Daily   Continuous Infusions: PRN Meds:.ondansetron (ZOFRAN) IV    Assessment/Plan: 69 y.o. female with acute on chronic anemia, recent very dark stools  Hb baseline in the 9s and that is where she is after two units PRBC yesterday. She's had dark stools for 3-4 weeks. Never had GI testing (colonoscopy or EGD).  Heme stool status still unclear however I recommended that she have Colonoscopy and EGD to workup her anemia, very dark stools.  She understands and agrees, will plan for tomorrow with Dr. Loletha Carrow around 8am start.    Milus Banister, MD  07/23/2020, 9:48 AM [facility] Pager [phone]

## 2020-07-23 NOTE — Progress Notes (Signed)
[facility] Progress Note   Subjective:   Patient seen and examined at bedside.  Reports her breathing has improved but still a little SOB.  Denies CP, headache, edema, n/v/d, abdominal pain, weakness and fatigue.   Objective Vitals:   07/22/20 1653 07/22/20 2117 07/23/20 0455 07/23/20 0948  BP: (!) 157/70 (!) 170/83 131/72 (!) 153/76  Pulse: 79 93 79 81  Resp: 18 18 18 18  Temp: 97.6 F (36.4 C) 98.6 F (37 C) 98.4 F (36.9 C) 97.6 F (36.4 C)  TempSrc: Oral Oral Oral Oral  SpO2: 100% 100% 100% 100%  Weight:      Height:       Physical Exam General:well appearing female in NAD Heart:RRR Lungs:CTAB, nml WOB Abdomen:soft, NTND Extremities:no LE edema Dialysis Access: LUE AVF +b/t   Filed Weights   07/21/20 1446  Weight: 77.2 kg    Intake/Output Summary (Last 24 hours) at 07/23/2020 1300 Last data filed at 07/23/2020 1000 Gross per 24 hour  Intake 940 ml  Output 0 ml  Net 940 ml    Additional Objective Labs: Basic Metabolic Panel: Recent Labs  Lab 07/21/20 0140 07/21/20 0140 07/21/20 0153 07/22/20 0049 07/23/20 0548  NA 138   < > 136 138 136  K 4.3   < > 4.3 3.8 3.9  CL 94*   < > 95* 97* 100  CO2 29  --   --  31 26  GLUCOSE 285*   < > 288* 176* 53*  BUN 22   < > 27* 27* 24*  CREATININE 5.30*   < > 5.80* 6.26* 4.96*  CALCIUM 9.9  --   --  9.4 9.3  PHOS  --   --   --  4.4  --    < > = values in this interval not displayed.   Liver Function Tests: Recent Labs  Lab 07/22/20 0049  ALBUMIN 2.8*   CBC: Recent Labs  Lab 07/21/20 0140 07/21/20 0153 07/22/20 0050 07/22/20 1057 07/23/20 0548  WBC 10.2  --  8.3  --  8.8  NEUTROABS 5.6  --   --   --   --   HGB 7.0*   < > 5.7* 9.5* 9.9*  HCT 23.1*   < > 19.1* 28.6* 30.3*  MCV 100.4*  --  99.5  --  92.9  PLT 408*  --  350  --  367   < > = values in this interval not displayed.   CBG: Recent Labs  Lab 07/22/20 1620 07/22/20 2116 07/23/20 0650 07/23/20 0723 07/23/20 1139  GLUCAP  209* 199* 46* 101* 191*   Iron Studies:  Recent Labs    07/21/20 1327  IRON 45  TIBC 262  FERRITIN 1,016*   Lab Results  Component Value Date   INR 1.1 12/29/2019   INR 0.98 07/13/2015   Studies/Results: No results found.  Medications:  . sodium chloride   Intravenous Once  . sodium chloride   Intravenous Once  . amLODipine  10 mg Oral Daily  . calcitRIOL  2.5 mcg Oral Q T,Th,Sa-HD  . Chlorhexidine Gluconate Cloth  6 each Topical Q0600  . [START ON 07/24/2020] darbepoetin (ARANESP) injection - DIALYSIS  100 mcg Intravenous Q Tue-HD  . ferric citrate  630 mg Oral TID WC  . insulin aspart  0-5 Units Subcutaneous QHS  . insulin aspart  0-6 Units Subcutaneous TID WC  . insulin detemir  14 Units Subcutaneous QHS  . multivitamin  1 tablet Oral   Daily  . peg 3350 powder  0.5 kit Oral Once   And  . [START ON 07/24/2020] peg 3350 powder  0.5 kit Oral Once  . polyethylene glycol  17 g Oral Daily    Dialysis Orders: TTS [facility] EDW 73.5 (74.6 last post wt) 2K 2 Ca 400/800 heparin 2200 Mircera 30 last 9/23 - just increased to 225 venofer 50/week - calcitriol 2.5 Aruixya 3 ac 1 with snacksmost recent tsat 30%left lower AVF  Assessment/Plan: 1. SOB secondary to mild volume excess and low hgb -transfused 2 units yesterday; SOB improving but not back to baseline. Initial CXR findings worse than clinical appearance. Minimal volume removed with HD yesterday ~1700ml. Some of CXR interstitial changes may be chronic. Will repeat CXR 2. ESRD- TTS -HD finished early amon Sunday. Heparin held d/t acute drop in Hgb/possible GIB. HD tomorrow per regular schedule following colonoscopy/EGD.     3.  Hypertension/volume-BP mildly elevated this AM.  Does not appear grossly volume overloaded on exam.  Plan for UF to dry weight tomorrow.     4.  Anemia- hgb^9.9 - acute drop noted as OP from usual 10-11 range down to 6.7, verified on repeat labs.  ESA dose increased as OP but has not received increased  dose.  Hgb down to 5.7 here 9/26 now improved s/p 2 units pRBC with HD on 9/26.   recheck Fe studies tsat dropped form 30 > 17% ferritin 1016 folate high, FOBTordered.  Fe - ESA not given with HD - rescheduled for Tuesday.  GI following, plan colonscopy and EGD tomorrow. 5.  Metabolic bone disease- contiue VDRA and binders Ca/P ok 6.  Nutrition- renal carb mod diet/vits 7.  DM- per primary  Lindsay Penninger, PA-C [facility] 07/23/2020,1:00 PM  LOS: 1 day    

## 2020-07-23 NOTE — Progress Notes (Signed)
SATURATION QUALIFICATIONS: (This note is used to comply with regulatory documentation for home oxygen)  Patient Saturations on Room Air at Rest = 99 %  Patient Saturations on Room Air while Ambulating = 97%  Patient Saturations on 0 Liters of oxygen while Ambulating = 98 %  Please briefly explain why patient needs home oxygen:  Patient does NOT need oxygen at home with 97-99 % O2 Sat at rest and ambulating on room air

## 2020-07-23 NOTE — Progress Notes (Addendum)
PROGRESS NOTE  Melissa Howe SWN:[id number] DOB: Mar 06, 1951 DOA: 07/21/2020 PCP: Sonia Side., FNP   LOS: 1 day   Brief narrative: As per HPI,  Melissa Howe is a 69 y.o. female with medical history significant for ESRD on hemodialysis Tuesday, Thursday and Saturday, iron deficiency anemia, essential hypertension, hyperlipidemia, type 2 diabetes mellitus presented to [facility] ED due to shortness of breath since the day prior to her presentation.  This was associated with craving and eating a lot of ice chips.  She has been compliant with her hemodialysis.  Last session was on Thursday, 07/19/2020. In the ED, patient was noted to be volume overloaded on exam.  Chest x-ray showed increase in pulmonary vascularity consistent with pulmonary edema.  Patient was hypoxic, requiring 2 L to maintain O2 saturation greater than 90%.  Patient was admitted for volume overload requiring hemodialysis and new oxygen demand.  Likely will require home oxygen evaluation prior to discharge.  TOC consulted to assist with possible DME oxygen need.   Assessment/Plan:  Principal Problem:   Volume overload Active Problems:   Diabetic neuropathy (HCC)   Essential hypertension   End stage renal disease on dialysis (HCC)  Volume overload.  initially was on supplemental oxygen..  Chest x-ray with pulmonary edema. Possibility of incomplete hemodialysis/ fluid restriction noncompliance.  Nephrology on board for hemodialysis. Improved at this time. Currently on room air  Severe anemia. Patient complains of black stools for the last 1 month. Has not had EGD or colonoscopy in the past. Hemoglobin of 5.7 on 07/22/2020.  Status post 2 units of packed RBC transfusion.  Hemoglobin of today.  Check FOBT.   Ferritin 1016.  Iron saturation at 17.  Aspirin on hold.  GI has been consulted and plan is colonoscopy/EGD in a.m.  ESRD on hemodialysis.  on Tuesday, Thursday Saturday. Nephrology on board.  Acute on chronic combined  congestive heart failure last known left ventricular ejection fraction of 45 to 60%.  Hemodialysis for volume management. Currently compensated.  Diabetes mellitus type II. Continue Lantus, sliding scale insulin.  Hemoglobin A1c of 8.7.   diabetic diet.  DVT prophylaxis: Place and maintain sequential compression device Start: 07/22/20 1200SCD, hold heparin subq    Code Status: Full code  Family Communication:  None  Status is: Inpatient  The patient is inpatient because: Inpatient level of care appropriate due to severity of illness and Severe anemia requiring transfusion, plan for colonoscopy/EGD in a.m.  Dispo:  Patient From: Home  Planned Disposition: Home  Expected discharge date: 2 days  Medically stable for discharge: No   Consultants:   Nephrology  GI  Procedures:  PRBC transfusion 2 units  Hemodialysis  Antibiotics:  . None Anti-infectives (From admission, onward)   None      Subjective:  Today, patient was seen and examined at bedside.  Denies any chest pain, shortness of breath, fever or chills.  No abdominal pain nausea or vomiting.  Objective: Vitals:   07/22/20 2117 07/23/20 0455  BP: (!) 170/83 131/72  Pulse: 93 79  Resp: 18 18  Temp: 98.6 F (37 C) 98.4 F (36.9 C)  SpO2: 100% 100%    Intake/Output Summary (Last 24 hours) at 07/23/2020 0830 Last data filed at 07/23/2020 0800 Gross per 24 hour  Intake 940 ml  Output 0 ml  Net 940 ml   Filed Weights   07/21/20 1446  Weight: 77.2 kg   Body mass index is 29.21 kg/m.   Physical Exam:  GENERAL: Patient is alert awake and oriented. Not in obvious distress. HENT: Mild pallor noted. Pupils equally reactive to light. Oral mucosa is moist NECK: is supple, no gross swelling noted. CHEST: Clear to auscultation.   Diminished breath sounds bilaterally. CVS: S1 and S2 heard, no murmur. Regular rate and rhythm.  ABDOMEN: Soft, non-tender, bowel sounds are present. EXTREMITIES: No  edema. CNS: Cranial nerves are intact. No focal motor deficits. SKIN: warm and dry without rashes.  Data Review: I have personally reviewed the following laboratory data and studies,  CBC: Recent Labs  Lab 07/21/20 0140 07/21/20 0153 07/22/20 0050 07/22/20 1057 07/23/20 0548  WBC 10.2  --  8.3  --  8.8  NEUTROABS 5.6  --   --   --   --   HGB 7.0* 7.1* 5.7* 9.5* 9.9*  HCT 23.1* 21.0* 19.1* 28.6* 30.3*  MCV 100.4*  --  99.5  --  92.9  PLT 408*  --  350  --  656   Basic Metabolic Panel: Recent Labs  Lab 07/21/20 0140 07/21/20 0153 07/22/20 0049 07/23/20 0548  NA 138 136 138 136  K 4.3 4.3 3.8 3.9  CL 94* 95* 97* 100  CO2 29  --  31 26  GLUCOSE 285* 288* 176* 53*  BUN 22 27* 27* 24*  CREATININE 5.30* 5.80* 6.26* 4.96*  CALCIUM 9.9  --  9.4 9.3  PHOS  --   --  4.4  --    Liver Function Tests: Recent Labs  Lab 07/22/20 0049  ALBUMIN 2.8*   No results for input(s): LIPASE, AMYLASE in the last 168 hours. No results for input(s): AMMONIA in the last 168 hours. Cardiac Enzymes: No results for input(s): CKTOTAL, CKMB, CKMBINDEX, TROPONINI in the last 168 hours. BNP (last 3 results) Recent Labs    08/05/19 1444 07/21/20 0140  BNP 471.4* 1,528.7*    ProBNP (last 3 results) No results for input(s): PROBNP in the last 8760 hours.  CBG: Recent Labs  Lab 07/22/20 1113 07/22/20 1620 07/22/20 2116 07/23/20 0650 07/23/20 0723  GLUCAP 120* 209* 199* 46* 101*   Recent Results (from the past 240 hour(s))  Respiratory Panel by RT PCR (Flu A&B, Covid) - Nasopharyngeal Swab     Status: None   Collection Time: 07/21/20  1:47 AM   Specimen: Nasopharyngeal Swab  Result Value Ref Range Status   SARS Coronavirus 2 by RT PCR NEGATIVE NEGATIVE Final    Comment: (NOTE) SARS-CoV-2 target nucleic acids are NOT DETECTED.  The SARS-CoV-2 RNA is generally detectable in upper respiratoy specimens during the acute phase of infection. The lowest concentration of SARS-CoV-2 viral  copies this assay can detect is 131 copies/mL. A negative result does not preclude SARS-Cov-2 infection and should not be used as the sole basis for treatment or other patient management decisions. A negative result may occur with  improper specimen collection/handling, submission of specimen other than nasopharyngeal swab, presence of viral mutation(s) within the areas targeted by this assay, and inadequate number of viral copies (<131 copies/mL). A negative result must be combined with clinical observations, patient history, and epidemiological information. The expected result is Negative.  Fact Sheet for Patients:  PinkCheek.be  Fact Sheet for Healthcare Providers:  GravelBags.it  This test is no t yet approved or cleared by the [place] FDA and  has been authorized for detection and/or diagnosis of SARS-CoV-2 by FDA under an Emergency Use Authorization (EUA). This EUA will remain  in effect (meaning this test can  be used) for the duration of the COVID-19 declaration under Section 564(b)(1) of the Act, 21 U.S.C. section 360bbb-3(b)(1), unless the authorization is terminated or revoked sooner.     Influenza A by PCR NEGATIVE NEGATIVE Final   Influenza B by PCR NEGATIVE NEGATIVE Final    Comment: (NOTE) The Xpert Xpress SARS-CoV-2/FLU/RSV assay is intended as an aid in  the diagnosis of influenza from Nasopharyngeal swab specimens and  should not be used as a sole basis for treatment. Nasal washings and  aspirates are unacceptable for Xpert Xpress SARS-CoV-2/FLU/RSV  testing.  Fact Sheet for Patients: PinkCheek.be  Fact Sheet for Healthcare Providers: GravelBags.it  This test is not yet approved or cleared by the [place] FDA and  has been authorized for detection and/or diagnosis of SARS-CoV-2 by  FDA under an Emergency Use Authorization (EUA). This  EUA will remain  in effect (meaning this test can be used) for the duration of the  Covid-19 declaration under Section 564(b)(1) of the Act, 21  U.S.C. section 360bbb-3(b)(1), unless the authorization is  terminated or revoked. Performed at [facility], [place] [address] [place], [place] [address]      Studies: No results found.    Flora Lipps, MD  [facility] 07/23/2020

## 2020-07-23 NOTE — Progress Notes (Signed)
[facility] Progress Note    Since last GI note: No overt GI bleeding.  No abd pains.  Feels pretty well overall.  Objective: Vital signs in last 24 hours: Temp:  [97.6 F (36.4 C)-98.6 F (37 C)] 97.6 F (36.4 C) (09/27 0948) Pulse Rate:  [79-93] 81 (09/27 0948) Resp:  [18] 18 (09/27 0948) BP: (131-170)/(70-83) 153/76 (09/27 0948) SpO2:  [100 %] 100 % (09/27 0948) Last BM Date: 07/19/20 General: alert and oriented times 3 Heart: regular rate and rythm Abdomen: soft, non-tender, non-distended, normal bowel sounds   Lab Results: Recent Labs    07/21/20 0140 07/21/20 0153 07/22/20 0050 07/22/20 1057 07/23/20 0548  WBC 10.2  --  8.3  --  8.8  HGB 7.0*   < > 5.7* 9.5* 9.9*  PLT 408*  --  350  --  367  MCV 100.4*  --  99.5  --  92.9   < > = values in this interval not displayed.   Recent Labs    07/21/20 0140 07/21/20 0140 07/21/20 0153 07/22/20 0049 07/23/20 0548  NA 138   < > 136 138 136  K 4.3   < > 4.3 3.8 3.9  CL 94*   < > 95* 97* 100  CO2 29  --   --  31 26  GLUCOSE 285*   < > 288* 176* 53*  BUN 22   < > 27* 27* 24*  CREATININE 5.30*   < > 5.80* 6.26* 4.96*  CALCIUM 9.9  --   --  9.4 9.3   < > = values in this interval not displayed.   Recent Labs    07/22/20 0049  ALBUMIN 2.8*   No results for input(s): INR in the last 72 hours.   Studies/Results: No results found.   Medications: Scheduled Meds: . sodium chloride   Intravenous Once  . sodium chloride   Intravenous Once  . amLODipine  10 mg Oral Daily  . calcitRIOL  2.5 mcg Oral Q T,Th,Sa-HD  . Chlorhexidine Gluconate Cloth  6 each Topical Q0600  . [START ON 07/24/2020] darbepoetin (ARANESP) injection - DIALYSIS  100 mcg Intravenous Q Tue-HD  . ferric citrate  630 mg Oral TID WC  . insulin aspart  0-5 Units Subcutaneous QHS  . insulin aspart  0-6 Units Subcutaneous TID WC  . insulin detemir  18 Units Subcutaneous QHS  . multivitamin  1 tablet Oral Daily  . polyethylene glycol   17 g Oral Daily   Continuous Infusions: PRN Meds:.ondansetron (ZOFRAN) IV    Assessment/Plan: 69 y.o. female with acute on chronic anemia, recent very dark stools  Hb baseline in the 9s and that is where she is after two units PRBC yesterday. She's had dark stools for 3-4 weeks. Never had GI testing (colonoscopy or EGD).  Heme stool status still unclear however I recommended that she have Colonoscopy and EGD to workup her anemia, very dark stools.  She understands and agrees, will plan for tomorrow with Dr. Loletha Carrow around 8am start.    Milus Banister, MD  07/23/2020, 9:48 AM [facility] Pager [phone]

## 2020-07-23 NOTE — Plan of Care (Signed)
  Problem: Clinical Measurements: Goal: Respiratory complications will improve Outcome: Progressing   Problem: Education: Goal: Knowledge of disease and its progression will improve Outcome: Progressing   Problem: Fluid Volume: Goal: Compliance with measures to maintain balanced fluid volume will improve Outcome: Progressing   Problem: Clinical Measurements: Goal: Complications related to the disease process, condition or treatment will be avoided or minimized Outcome: Progressing

## 2020-07-23 NOTE — Progress Notes (Signed)
Patient resting comfortably on RA with no respiratory distress noted. BIPAP not needed at this time. RT will monitor as needed.

## 2020-07-24 ENCOUNTER — Other Ambulatory Visit: Payer: Self-pay | Admitting: Physician Assistant

## 2020-07-24 ENCOUNTER — Inpatient Hospital Stay (HOSPITAL_COMMUNITY): Payer: Medicare Other | Admitting: Certified Registered Nurse Anesthetist

## 2020-07-24 ENCOUNTER — Encounter (HOSPITAL_COMMUNITY): Payer: Self-pay | Admitting: Internal Medicine

## 2020-07-24 ENCOUNTER — Encounter (HOSPITAL_COMMUNITY)
Admission: EM | Disposition: A | Discharge status: Home / Self Care | Payer: Self-pay | Source: Home / Self Care | Attending: Internal Medicine

## 2020-07-24 DIAGNOSIS — Z992 Dependence on renal dialysis: Secondary | ICD-10-CM

## 2020-07-24 LAB — BASIC METABOLIC PANEL
Anion gap: 19 — ABNORMAL HIGH (ref 5–15)
BUN: 31 mg/dL — ABNORMAL HIGH (ref 8–23)
CO2: 21 mmol/L — ABNORMAL LOW (ref 22–32)
Calcium: 10 mg/dL (ref 8.9–10.3)
Chloride: 99 mmol/L (ref 98–111)
Creatinine, Ser: 5.96 mg/dL — ABNORMAL HIGH (ref 0.44–1.00)
GFR calc Af Amer: 8 mL/min — ABNORMAL LOW (ref >60)
GFR calc non Af Amer: 7 mL/min — ABNORMAL LOW (ref >60)
Glucose, Bld: 65 mg/dL — ABNORMAL LOW (ref 70–99)
Potassium: 3.9 mmol/L (ref 3.5–5.1)
Sodium: 139 mmol/L (ref 135–145)

## 2020-07-24 LAB — CBC
HCT: 36.1 % (ref 36.0–46.0)
Hemoglobin: 11.5 g/dL — ABNORMAL LOW (ref 12.0–15.0)
MCH: 30.1 pg (ref 26.0–34.0)
MCHC: 31.9 g/dL (ref 30.0–36.0)
MCV: 94.5 fL (ref 80.0–100.0)
Platelets: 435 10*3/uL — ABNORMAL HIGH (ref 150–400)
RBC: 3.82 MIL/uL — ABNORMAL LOW (ref 3.87–5.11)
RDW: 16.2 % — ABNORMAL HIGH (ref 11.5–15.5)
WBC: 12.2 10*3/uL — ABNORMAL HIGH (ref 4.0–10.5)
nRBC: 0 % (ref 0.0–0.2)

## 2020-07-24 LAB — GLUCOSE, CAPILLARY
Glucose-Capillary: 61 mg/dL — ABNORMAL LOW (ref 70–99)
Glucose-Capillary: 68 mg/dL — ABNORMAL LOW (ref 70–99)
Glucose-Capillary: 94 mg/dL (ref 70–99)
Glucose-Capillary: 94 mg/dL (ref 70–99)
Glucose-Capillary: 103 mg/dL — ABNORMAL HIGH (ref 70–99)
Glucose-Capillary: 99 mg/dL (ref 70–99)

## 2020-07-24 SURGERY — COLONOSCOPY WITH PROPOFOL
Anesthesia: Monitor Anesthesia Care

## 2020-07-24 MED ORDER — LIDOCAINE 2% (20 MG/ML) 5 ML SYRINGE
INTRAMUSCULAR | Status: DC | PRN
  Administered 2020-07-24: 60 mg via INTRAVENOUS

## 2020-07-24 MED ORDER — SODIUM CHLORIDE 0.9 % IV SOLN: INTRAVENOUS | Status: DC

## 2020-07-24 MED ORDER — CALCITRIOL 0.5 MCG PO CAPS: 2.5000 ug | ORAL_CAPSULE | ORAL | 2 refills | Status: DC

## 2020-07-24 MED ORDER — PHENYLEPHRINE 40 MCG/ML (10ML) SYRINGE FOR IV PUSH (FOR BLOOD PRESSURE SUPPORT)
PREFILLED_SYRINGE | INTRAVENOUS | Status: DC | PRN
  Administered 2020-07-24: 80 ug via INTRAVENOUS
  Administered 2020-07-24: 120 ug via INTRAVENOUS

## 2020-07-24 MED ORDER — PROPOFOL 10 MG/ML IV BOLUS
INTRAVENOUS | Status: DC | PRN
  Administered 2020-07-24 (×3): 20 mg via INTRAVENOUS

## 2020-07-24 MED ORDER — PROPOFOL 500 MG/50ML IV EMUL
INTRAVENOUS | Status: DC | PRN
  Administered 2020-07-24: 75 ug/kg/min via INTRAVENOUS

## 2020-07-24 MED ORDER — SODIUM CHLORIDE 0.9 % IV SOLN: INTRAVENOUS | Status: DC | PRN

## 2020-07-24 MED ORDER — DEXTROSE 50 % IV SOLN
INTRAVENOUS | Status: AC
  Filled 2020-07-24: qty 50

## 2020-07-24 MED ORDER — PANTOPRAZOLE SODIUM 40 MG PO TBEC: 40.0000 mg | DELAYED_RELEASE_TABLET | Freq: Two times a day (BID) | ORAL | 0 refills | Status: DC

## 2020-07-24 MED ORDER — PANTOPRAZOLE SODIUM 40 MG PO TBEC
40.0000 mg | DELAYED_RELEASE_TABLET | Freq: Two times a day (BID) | ORAL | Status: DC
  Administered 2020-07-24: 40 mg via ORAL
  Filled 2020-07-24: qty 1

## 2020-07-24 MED ORDER — DEXTROSE 50 % IV SOLN
12.5000 g | INTRAVENOUS | Status: AC
  Administered 2020-07-24: 12.5 g via INTRAVENOUS

## 2020-07-24 SURGICAL SUPPLY — 25 items

## 2020-07-24 NOTE — Transfer of Care (Signed)
Immediate Anesthesia Transfer of Care Note  Patient: Melissa Howe  Procedure(s) Performed: COLONOSCOPY WITH PROPOFOL (N/A ) ESOPHAGOGASTRODUODENOSCOPY (EGD) WITH PROPOFOL (N/A ) BIOPSY POLYPECTOMY  Patient Location: Endoscopy Unit  Anesthesia Type:MAC  Level of Consciousness: awake, alert  and oriented  Airway & Oxygen Therapy: Patient Spontanous Breathing and Patient connected to nasal cannula oxygen  Post-op Assessment: Report given to RN and Post -op Vital signs reviewed and stable  Post vital signs: Reviewed and stable  Last Vitals:  Vitals Value Taken Time  BP 93/40 07/24/20 0956  Temp    Pulse 70 07/24/20 0956  Resp 12 07/24/20 0956  SpO2 100 % 07/24/20 0956  Vitals shown include unvalidated device data.  Last Pain:  Vitals:   07/24/20 0905  TempSrc: Oral  PainSc: 0-No pain         Complications: No complications documented.

## 2020-07-24 NOTE — Progress Notes (Signed)
Inpatient Diabetes Program Recommendations  AACE/ADA: New Consensus Statement on Inpatient Glycemic Control   Target Ranges:  Prepandial:   less than 140 mg/dL      Peak postprandial:   less than 180 mg/dL (1-2 hours)      Critically ill patients:  140 - 180 mg/dL   Results for [name], Melissa Howe (MRN [id number]) as of 07/24/2020 07:23  Ref. Range 07/23/2020 06:50 07/23/2020 07:23 07/23/2020 11:39 07/23/2020 16:22 07/23/2020 20:51 07/24/2020 06:39  Glucose-Capillary Latest Ref Range: 70 - 99 mg/dL 46 (L) 101 (H) 191 (H) 155 (H) 168 (H) 61 (L)   Review of Glycemic Control  Diabetes history: DM2 Outpatient Diabetes medications: Levemir 18 units QHS Current orders for Inpatient glycemic control: Levemir 14 units QHS, Novolog 0-6 units TID with meals, Novolog 0-5 units QHS  Inpatient Diabetes Program Recommendations:    Insulin: Please consider decreasing Levemir to 11 units QHS.  Thanks, Barnie Alderman, RN, MSN, CDE Diabetes Coordinator Inpatient Diabetes Program [phone] (Team Pager from 8am to 5pm)

## 2020-07-24 NOTE — Anesthesia Procedure Notes (Signed)
Procedure Name: MAC Date/Time: 07/24/2020 9:12 AM Performed by: Candis Shine, CRNA Pre-anesthesia Checklist: Patient identified, Emergency Drugs available, Suction available, Patient being monitored and Timeout performed Patient Re-evaluated:Patient Re-evaluated prior to induction Oxygen Delivery Method: Nasal cannula Dental Injury: Teeth and Oropharynx as per pre-operative assessment

## 2020-07-24 NOTE — Progress Notes (Signed)
DISCHARGE NOTE HOME [name] Face to be discharged Home per MD order. Discussed prescriptions and follow up appointments with the patient. Prescriptions given to patient; medication list explained in detail. Patient verbalized understanding.  Skin clean, dry and intact without evidence of skin break down, no evidence of skin tears noted. IV catheter discontinued intact. Site without signs and symptoms of complications. Dressing and pressure applied. Pt denies pain at the site currently. No complaints noted.  Patient free of lines, drains, and wounds.   An After Visit Summary (AVS) was printed and given to the patient. Patient escorted via wheelchair, and discharged home via private auto.  [name], RN

## 2020-07-24 NOTE — Op Note (Signed)
[facility] Patient Name: Melissa Howe Procedure Date : 07/24/2020 MRN: [id number] Attending MD: Estill Cotta. Loletha Carrow , MD Date of Birth: Sep 09, 1951 CSN: [id number] Age: 69 Admit Type: Inpatient Procedure:                Upper GI endoscopy Indications:              Iron deficiency anemia secondary to chronic blood                            loss, , Melena (possible - black stool) -                            Hemodialysis patient Providers:                Mallie Mussel L. Loletha Carrow, MD, Angus Seller, [name],                            Technician Referring MD:              Medicines:                Monitored Anesthesia Care Complications:            No immediate complications. Estimated Blood Loss:     Estimated blood loss was minimal. Procedure:                Pre-Anesthesia Assessment:                           - Prior to the procedure, a History and Physical                            was performed, and patient medications and                            allergies were reviewed. The patient's tolerance of                            previous anesthesia was also reviewed. The risks                            and benefits of the procedure and the sedation                            options and risks were discussed with the patient.                            All questions were answered, and informed consent                            was obtained. Prior Anticoagulants: The patient has                            taken no previous anticoagulant or antiplatelet  agents. ASA Grade Assessment: III - A patient with                            severe systemic disease. After reviewing the risks                            and benefits, the patient was deemed in                            satisfactory condition to undergo the procedure.                           After obtaining informed consent, the endoscope was                            passed under direct vision.  Throughout the                            procedure, the patient's blood pressure, pulse, and                            oxygen saturations were monitored continuously. The                            [id number]-H190 ([id number]) Olympus gastroscope was                            introduced through the mouth, and advanced to the                            second part of duodenum. The upper GI endoscopy was                            accomplished without difficulty. The patient                            tolerated the procedure well. Scope In: Scope Out: Findings:      The esophagus was normal.      One non-bleeding, benign-appearing, linear gastric ulcer with no       stigmata of bleeding was found in the prepyloric region of the stomach.       The lesion was 5 mm in largest dimension, with adjacent mildly inflamed       tissue suggesting it may have previously been a larger ulcer that has       partially healed. Biopsies were taken from the ulcer edge with a cold       forceps for histology. (Jar 1)      Additional biopsies taken from the gastric antrum and body with a cold       forceps for histology to rule out H. pylori.(Jar 2)      The exam of the stomach was otherwise normal.      The examined duodenum was normal.      Two diminutive non-bleeding superficial gastric ulcers with no stigmata       of bleeding were found in  the gastric fundus. Impression:               - Normal esophagus.                           - Non-bleeding gastric ulcer with no stigmata of                            bleeding. Biopsied.                           - Normal examined duodenum. Recommendation:           - Return patient to hospital ward for possible                            discharge same day.                           - Resume regular diet.                           - Pantoprazole 40 mg twice daily before meals x 6                            weeks.                           Repat EGD in 6 weeks to confirm  ulcer healing.                           See colonoscopy report.                           No aspirin until confirmation of ulcer healing. Procedure Code(s):        --- Professional ---                           [phone], Esophagogastroduodenoscopy, flexible,                            transoral; with biopsy, single or multiple Diagnosis Code(s):        --- Professional ---                           K25.9, Gastric ulcer, unspecified as acute or                            chronic, without hemorrhage or perforation                           D50.0, Iron deficiency anemia secondary to blood                            loss (chronic)                           K92.1, Melena (includes Hematochezia) CPT copyright  2019 American Medical Association. All rights reserved. The codes documented in this report are preliminary and upon coder review may  be revised to meet current compliance requirements. Eryn Marandola L. Loletha Carrow, MD 07/24/2020 10:01:26 AM This report has been signed electronically. Number of Addenda: 0

## 2020-07-24 NOTE — Progress Notes (Signed)
[facility] Progress Note   Subjective:   Patient seen and examined at bedside during dialysis.  Tolerating treatment well.  Reports she is much better. Denies CP, SOB, weakness, dizziness and fatigue.  EGD and colonoscopy completed this AM.  Reports she had 1 polyp and an stomach ulcer.    Objective Vitals:   07/24/20 1247 07/24/20 1252 07/24/20 1315 07/24/20 1330  BP: (!) 151/76 (!) 144/69 (!) 152/69 (!) 158/75  Pulse: 78 80 77 80  Resp: 20 16 16 15   Temp:      TempSrc:      SpO2:      Weight:      Height:       Physical Exam General:WDWN female in NAD Heart:RRR Lungs:CTAB Abdomen:soft, NTND Extremities:no LE edema Dialysis Access: LU AVG cannulated.    Filed Weights   07/21/20 1446 07/23/20 0948 07/24/20 1230  Weight: 77.2 kg 73.1 kg 75.2 kg    Intake/Output Summary (Last 24 hours) at 07/24/2020 1339 Last data filed at 07/24/2020 1100 Gross per 24 hour  Intake 2200 ml  Output 0 ml  Net 2200 ml    Additional Objective Labs: Basic Metabolic Panel: Recent Labs  Lab 07/22/20 0049 07/23/20 0548 07/24/20 0510  NA 138 136 139  K 3.8 3.9 3.9  CL 97* 100 99  CO2 31 26 21*  GLUCOSE 176* 53* 65*  BUN 27* 24* 31*  CREATININE 6.26* 4.96* 5.96*  CALCIUM 9.4 9.3 10.0  PHOS 4.4  --   --    Liver Function Tests: Recent Labs  Lab 07/22/20 0049  ALBUMIN 2.8*   CBC: Recent Labs  Lab 07/21/20 0140 07/21/20 0153 07/22/20 0050 07/22/20 0050 07/22/20 1057 07/23/20 0548 07/24/20 0510  WBC 10.2   < > 8.3  --   --  8.8 12.2*  NEUTROABS 5.6  --   --   --   --   --   --   HGB 7.0*   < > 5.7*   < > 9.5* 9.9* 11.5*  HCT 23.1*   < > 19.1*   < > 28.6* 30.3* 36.1  MCV 100.4*  --  99.5  --   --  92.9 94.5  PLT 408*   < > 350  --   --  367 435*   < > = values in this interval not displayed.   CBG: Recent Labs  Lab 07/24/20 0639 07/24/20 0838 07/24/20 0924 07/24/20 1012 07/24/20 1127  GLUCAP 61* 68* 94 94 103*    Lab Results  Component Value  Date   INR 1.1 12/29/2019   INR 0.98 07/13/2015   Studies/Results: DG Chest 2 View  Result Date: 07/23/2020 CLINICAL DATA:  Shortness of breath EXAM: CHEST - 2 VIEW COMPARISON:  July 21, 2020 FINDINGS: Lungs are clear. Heart size and pulmonary vascularity are normal. No adenopathy. There is degenerative change in the thoracic spine. IMPRESSION: No edema or airspace opacity. Cardiac silhouette within normal limits. Electronically Signed   By: Lowella Grip III M.D.   On: 07/23/2020 14:03    Medications: . sodium chloride     . sodium chloride   Intravenous Once  . sodium chloride   Intravenous Once  . amLODipine  10 mg Oral Daily  . calcitRIOL  2.5 mcg Oral Q T,Th,Sa-HD  . Chlorhexidine Gluconate Cloth  6 each Topical Q0600  . Chlorhexidine Gluconate Cloth  6 each Topical Q0600  . ferric citrate  630 mg Oral TID WC  . insulin aspart  0-5 Units Subcutaneous QHS  . insulin aspart  0-6 Units Subcutaneous TID WC  . insulin detemir  14 Units Subcutaneous QHS  . multivitamin  1 tablet Oral Daily  . pantoprazole  40 mg Oral BID AC  . polyethylene glycol  17 g Oral Daily    Dialysis Orders: TTS [facility] EDW 73.5 (74.6 last post wt) 2K 2 Ca 400/800 heparin 2200 Mircera 30 last 9/23 - just increased to 225 venofer 50/week - calcitriol 2.5 Aruixya 3 ac 1 with snacksmost recent tsat 30%left lower AVF  Assessment/Plan: 1. SOB secondary to mild volume excess and low hgb -resolved. transfused 2 units yesterday. Initial CXR findings worse than clinical appearance. Repeat CXR on 2/28 with no acute findings. Net UF goal today 2L  2. ESRD- TTS -  HD today per regular schedule. Resume tight heparin.  3. Hypertension/volume-BP mildly elevated this AM.  Does not appear grossly volume overloaded on exam.  Plan for UF to dry weight tomorrow.   4. Anemia- hgb^9.9 - acute drop noted as OP from usual 10-11 range down to 6.7, verified on repeat labs.  ESA dose increased as OP but has not  received increased dose.  Hgb down to 5.7 here 9/26 now improved s/p 2 units pRBC with HD on 9/26.   recheck Fe studies tsat dropped form 30 > 17% ferritin 1016 folate high, FOBTordered.  Fe- ESA not given with HD - rescheduled for Tuesday.  5.Metabolic bone disease- contiue VDRA and bindersCa/P ok 6.Nutrition- renal carb mod diet/vits 7.DM- per primary 8. Gastric ulcer - EGD showed non bleeding gastric ulcer.  Started on pantoprazole.   Jen Mow, PA-C [facility] 07/24/2020,1:39 PM  LOS: 2 days

## 2020-07-24 NOTE — Interval H&P Note (Signed)
History and Physical Interval Note:  07/24/2020 9:00 AM  Melissa Howe  has presented today for surgery, with the diagnosis of anemia, dark stools.  The various methods of treatment have been discussed with the patient and family. After consideration of risks, benefits and other options for treatment, the patient has consented to  Procedure(s): COLONOSCOPY WITH PROPOFOL (N/A) ESOPHAGOGASTRODUODENOSCOPY (EGD) WITH PROPOFOL (N/A) as a surgical intervention.  The patient's history has been reviewed, patient examined, no change in status, stable for surgery.  I have reviewed the patient's chart and labs.  Questions were answered to the patient's satisfaction.    Patient stable today - Hgb 11.5 No CP or dyspnea, last HD 2 days ago. Hgb 11.5 today  [name]

## 2020-07-24 NOTE — Progress Notes (Signed)
Hypoglycemic Event  CBG: 68  Treatment: 12.5 g of dextrose per standing order  Symptoms: none  Follow-up CBG: Time:0920 CBG Result:94  Possible Reasons for Event:NPO for endo procedure  Comments/MD notified: MD Danis aware    Ned Clines

## 2020-07-24 NOTE — Op Note (Signed)
[facility] Patient Name: Melissa Howe Procedure Date : 07/24/2020 MRN: [id number] Attending MD: Estill Cotta. Loletha Carrow , MD Date of Birth: 21-Jan-1951 CSN: [id number] Age: 69 Admit Type: Inpatient Procedure:                Colonoscopy Indications:              Melena (possible - black stool), Iron deficiency                            anemia secondary to chronic blood loss,                            Hemodialysis patient on aspirin Providers:                Estill Cotta. Loletha Carrow, MD, Angus Seller, [name],                            Technician Referring MD:             [facility] Medicines:                Monitored Anesthesia Care Complications:            No immediate complications. Estimated Blood Loss:     Estimated blood loss was minimal. Procedure:                Pre-Anesthesia Assessment:                           - Prior to the procedure, a History and Physical                            was performed, and patient medications and                            allergies were reviewed. The patient's tolerance of                            previous anesthesia was also reviewed. The risks                            and benefits of the procedure and the sedation                            options and risks were discussed with the patient.                            All questions were answered, and informed consent                            was obtained. Prior Anticoagulants: The patient has                            taken no previous anticoagulant or antiplatelet  agents. ASA Grade Assessment: III - A patient with                            severe systemic disease. After reviewing the risks                            and benefits, the patient was deemed in                            satisfactory condition to undergo the procedure.                           - Prior to the procedure, a History and Physical                            was  performed, and patient medications and                            allergies were reviewed. The patient's tolerance of                            previous anesthesia was also reviewed. The risks                            and benefits of the procedure and the sedation                            options and risks were discussed with the patient.                            All questions were answered, and informed consent                            was obtained. Prior Anticoagulants: The patient has                            taken no previous anticoagulant or antiplatelet                            agents. ASA Grade Assessment: III - A patient with                            severe systemic disease. After reviewing the risks                            and benefits, the patient was deemed in                            satisfactory condition to undergo the procedure.                           After obtaining informed consent, the colonoscope  was passed under direct vision. Throughout the                            procedure, the patient's blood pressure, pulse, and                            oxygen saturations were monitored continuously. The                            [id number]-HQ190L ([id number] Olympus colonoscope was                            introduced through the anus and advanced to the the                            terminal ileum, with identification of the                            appendiceal orifice and IC valve. The colonoscopy                            was performed without difficulty. The patient                            tolerated the procedure well. The quality of the                            bowel preparation was fair, even after lavage. The                            terminal ileum, ileocecal valve, appendiceal                            orifice, and rectum were photographed. The bowel                            preparation used was MoviPrep. Scope In:  9:33:04 AM Scope Out: 9:47:52 AM Scope Withdrawal Time: 0 hours 9 minutes 0 seconds  Total Procedure Duration: 0 hours 14 minutes 48 seconds  Findings:      The perianal and digital rectal examinations were normal.      The terminal ileum appeared normal.      A 6 mm polyp was found in the ascending colon. The polyp was sessile.       The polyp was removed with a cold snare. Resection and retrieval were       complete.      The exam was otherwise without abnormality on direct and retroflexion       views (given limitation of prep). There was a black residue (not melena)       throughout the colon - ? from one or more of the patient's medicines. Impression:               - Preparation of the colon was fair.                           -  The examined portion of the ileum was normal.                           - One 6 mm polyp in the ascending colon, removed                            with a cold snare. Resected and retrieved.                           - The examination was otherwise normal on direct                            and retroflexion views.                           see above - while the patient does have a                            clean-based gastric ulcer,recently-reported black                            stool may be due to a medicine residue. Moderate Sedation:      MAC sedation used Recommendation:           - Return patient to hospital ward for possible                            discharge same day.                           - Resume regular diet.                           - Continue present medications.                           - Await pathology results.                           - Repeat colonoscopy in 1 year for surveillance due                            to fair bowel prep. 2-day prep for that exam.                           - See the other procedure note for documentation of                            additional recommendations. Procedure Code(s):        ---  Professional ---                           [phone], Colonoscopy, flexible; with removal of                            tumor(s),  polyp(s), or other lesion(s) by snare                            technique Diagnosis Code(s):        --- Professional ---                           K63.5, Polyp of colon                           K92.1, Melena (includes Hematochezia)                           D50.0, Iron deficiency anemia secondary to blood                            loss (chronic) CPT copyright 2019 American Medical Association. All rights reserved. The codes documented in this report are preliminary and upon coder review may  be revised to meet current compliance requirements. Madeline Bebout L. Loletha Carrow, MD 07/24/2020 10:07:44 AM This report has been signed electronically. Number of Addenda: 0

## 2020-07-24 NOTE — Anesthesia Preprocedure Evaluation (Signed)
Anesthesia Evaluation  Patient identified by MRN, date of birth, ID band Patient awake    Reviewed: Allergy & Precautions, H&P , NPO status , Patient's Chart, lab work & pertinent test results  History of Anesthesia Complications (+) PONV  Airway Mallampati: I  TM Distance: >3 FB Neck ROM: Full    Dental no notable dental hx. (+) Edentulous Upper, Edentulous Lower, Dental Advisory Given   Pulmonary neg pulmonary ROS, former smoker,    Pulmonary exam normal breath sounds clear to auscultation       Cardiovascular hypertension, Pt. on medications +CHF   Rhythm:Regular Rate:Normal     Neuro/Psych Depression negative neurological ROS     GI/Hepatic negative GI ROS, Neg liver ROS,   Endo/Other  diabetes, Insulin Dependent  Renal/GU ESRF and DialysisRenal disease  negative genitourinary   Musculoskeletal   Abdominal   Peds  Hematology negative hematology ROS (+)   Anesthesia Other Findings   Reproductive/Obstetrics negative OB ROS                             Anesthesia Physical Anesthesia Plan  ASA: III  Anesthesia Plan: MAC   Post-op Pain Management:    Induction: Intravenous  PONV Risk Score and Plan: 3 and Propofol infusion and Treatment may vary due to age or medical condition  Airway Management Planned: Nasal Cannula  Additional Equipment:   Intra-op Plan:   Post-operative Plan:   Informed Consent: I have reviewed the patients History and Physical, chart, labs and discussed the procedure including the risks, benefits and alternatives for the proposed anesthesia with the patient or authorized representative who has indicated his/her understanding and acceptance.     Dental advisory given  Plan Discussed with: CRNA  Anesthesia Plan Comments:         Anesthesia Quick Evaluation

## 2020-07-24 NOTE — Plan of Care (Signed)
°  Problem: Fluid Volume: °Goal: Compliance with measures to maintain balanced fluid volume will improve °Outcome: Progressing °  °Problem: Nutritional: °Goal: Ability to make healthy dietary choices will improve °Outcome: Progressing °  °

## 2020-07-24 NOTE — Anesthesia Postprocedure Evaluation (Signed)
Anesthesia Post Note  Patient: Monette Omara Cogburn  Procedure(s) Performed: COLONOSCOPY WITH PROPOFOL (N/A ) ESOPHAGOGASTRODUODENOSCOPY (EGD) WITH PROPOFOL (N/A ) BIOPSY POLYPECTOMY     Patient location during evaluation: Endoscopy Anesthesia Type: MAC Level of consciousness: awake and alert Pain management: pain level controlled Vital Signs Assessment: post-procedure vital signs reviewed and stable Respiratory status: spontaneous breathing, nonlabored ventilation and respiratory function stable Cardiovascular status: stable and blood pressure returned to baseline Postop Assessment: no apparent nausea or vomiting Anesthetic complications: no   No complications documented.  Last Vitals:  Vitals:   07/24/20 1005 07/24/20 1010  BP: (!) 116/38   Pulse: 69   Resp: 19   Temp:    SpO2: 100% 100%    Last Pain:  Vitals:   07/24/20 0958  TempSrc: Oral  PainSc: 0-No pain                 Ivelis Norgard,W. EDMOND

## 2020-07-24 NOTE — Plan of Care (Signed)
  Problem: Education: Goal: Knowledge of General Education information will improve Description Including pain rating scale, medication(s)/side effects and non-pharmacologic comfort measures Outcome: Progressing   Problem: Health Behavior/Discharge Planning: Goal: Ability to manage health-related needs will improve Outcome: Progressing   

## 2020-07-24 NOTE — Discharge Summary (Signed)
Physician Discharge Summary  Alysse Rathe Bromwell IPJ:[id number] DOB: 07-06-1951 DOA: 07/21/2020  PCP: Sonia Side., FNP  Admit date: 07/21/2020 Discharge date: 07/24/2020  Admitted From: Home  Discharge disposition: Home   Recommendations for Outpatient Follow-Up:   . Follow up with your primary care provider in one week.  . Check CBC, BMP, magnesium in the next visit .  Follow up with GI in 6 weeks for repeat endoscopy.    Discharge Diagnosis:   Principal Problem:   Volume overload Active Problems:   Diabetic neuropathy (HCC)   Essential hypertension   End stage renal disease on dialysis [facility])  Discharge Condition: Improved.  Diet recommendation: Low sodium, heart healthy.  Carbohydrate-modified.    Wound care: None.  Code status: Full.   History of Present Illness:   Melissa Howe a 69 y.o.femalewith medical history significant forESRD on hemodialysis Tuesday, Thursday and Saturday,iron deficiency anemia,essential hypertension, hyperlipidemia, type 2 diabetes mellitus presented to [facility] ED due to shortness of breath since the day prior to her presentation. This was associated withcraving andeating a lot of ice chips. She had been compliant with her hemodialysis. Last session was on Thursday, 07/19/2020. In the ED, patient was noted to be volume overloaded onexam.Chest x-ray showedincrease inpulmonary vascularity consistent withpulmonary edema. Patient was hypoxic,requiring 2 L to maintain O2 saturation greater than 90%. Patient was admitted for volume overload requiringhemodialysis andnew oxygen demand.   Hospital Course:   Following conditions were addressed during hospitalization as listed below,  Volume overload. initially was on supplemental oxygen. Chest x-ray with pulmonaryedema. Possibility of incomplete hemodialysis/fluid restriction noncompliance. Patient was seen by nephrology for hemodialysis.  At this time, patient has  improved significantly.  Not on supplemental oxygen.  Patient willing to continue modalities as outpatient.  Severe anemia. Patient complained of black stools for the last 1 month. Has not had EGD or colonoscopy in the past.  Patient had hemoglobin of 5.7 on 07/22/2020.  Status post 2 units of packed RBC transfusion.   Ferritin 1016.  Iron saturation at 17.  GI was consulted due to this and underwent colonoscopy/EGD  on 07/24/2020 clean-based gastric ulcer and colonic polyp.  GI recommended Protonix twice a day for 6 weeks and repeat endoscopy evaluation.  Hemoglobin prior to discharge was 11.5.  ESRD on hemodialysis.on Tuesday, Thursday Saturday.  Patient will be discharged after hemodialysis today.  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 Currently compensated.  On amlodipine at this time.  Diabetesmellitus type II.  Hemoglobin A1c of 8.7.     Continue insulin detemir at home.  Disposition.  At this time, patient is stable for disposition home after hemodialysis.  Patient will have to follow-up with GI and primary care physician as outpatient.  I also spoke with the patient's daughter Ms. Crystal Lennox Grumbles on the phone and updated her about the plan for disposition.  Medical Consultants:    Nephrology  GI  Procedures:     PRBC transfusion 2 units  Hemodialysis  Endoscopy and colonoscopy on 07/16/2020  Subjective:   Today, patient was seen and examined at bedside.  Denies any shortness of breath, chest pain, palpitation.  Discharge Exam:   Vitals:   07/24/20 1025 07/24/20 1045  BP: (!) 154/56 (!) 177/80  Pulse: 72 76  Resp: 12 16  Temp:  97.7 F (36.5 C)  SpO2: 100% 100%   Vitals:   07/24/20 1010 07/24/20 1015 07/24/20 1025 07/24/20 1045  BP:  (!) 165/51 (!) 154/56 (!) 177/80  Pulse:  72 72 76  Resp:  (!) 23 12 16   Temp:    97.7 F (36.5 C)  TempSrc:    Oral    SpO2: 100% 100% 100% 100%  Weight:      Height:       General: Alert awake, not in obvious distress HENT: pupils equally reacting to light, mild pallor noted.  Oral mucosa is moist.  Chest: Diminished breath sounds bilaterally.  No crackles or wheezes.  CVS: S1 &S2 heard. No murmur.  Regular rate and rhythm. Abdomen: Soft, nontender, nondistended.  Bowel sounds are heard.   Extremities: No cyanosis, clubbing or edema.  Peripheral pulses are palpable.  Left upper extremity AV graft Psych: Alert, awake and oriented, normal mood CNS:  No cranial nerve deficits.  Power equal in all extremities.   Skin: Warm and dry.  No rashes noted.  The results of significant diagnostics from this hospitalization (including imaging, microbiology, ancillary and laboratory) are listed below for reference.     Diagnostic Studies:   DG Chest Port 1 View  Result Date: 07/21/2020 CLINICAL DATA:  Shortness of breath, difficulty breathing, dialysis patient EXAM: PORTABLE CHEST 1 VIEW COMPARISON:  Radiograph 12/29/2019 FINDINGS: Diffusely increased interstitial opacity with more hazy and patchy opacities towards the mid to lower lungs. The pulmonary vascularity is cephalized indistinct. Cardiac silhouette is slightly prominent though likely accentuated by portable technique. Cardiomediastinal contours and cells are not significantly changed from prior. No pneumothorax. There may be trace bilateral effusions with some thickening in the costophrenic sulci. No pneumothorax. No acute osseous or soft tissue abnormality. Degenerative changes are present in the imaged spine and shoulders. Telemetry leads overlie the chest. IMPRESSION: Features most compatible with volume overload with likely interstitial and alveolar pulmonary edema and trace bilateral effusions. Electronically Signed   By: Lovena Le M.D.   On: 07/21/2020 02:40    Labs:   Basic Metabolic Panel: Recent Labs  Lab 07/21/20 0140 07/21/20 0140  07/21/20 0153 07/21/20 0153 07/22/20 0049 07/22/20 0049 07/23/20 0548 07/24/20 0510  NA 138  --  136  --  138  --  136 139  K 4.3   < > 4.3   < > 3.8   < > 3.9 3.9  CL 94*  --  95*  --  97*  --  100 99  CO2 29  --   --   --  31  --  26 21*  GLUCOSE 285*  --  288*  --  176*  --  53* 65*  BUN 22  --  27*  --  27*  --  24* 31*  CREATININE 5.30*  --  5.80*  --  6.26*  --  4.96* 5.96*  CALCIUM 9.9  --   --   --  9.4  --  9.3 10.0  PHOS  --   --   --   --  4.4  --   --   --    < > = values in this interval not displayed.   GFR Estimated Creatinine Clearance: 8.6 mL/min (A) (by C-G formula based on SCr of 5.96 mg/dL (H)). Liver Function Tests: Recent Labs  Lab 07/22/20 0049  ALBUMIN 2.8*   No results for input(s): LIPASE, AMYLASE in the last 168 hours. No results for input(s): AMMONIA in the last 168 hours. Coagulation profile No results for input(s): INR, PROTIME in the last 168 hours.  CBC: Recent  Labs  Lab 07/21/20 0140 07/21/20 0140 07/21/20 0153 07/22/20 0050 07/22/20 1057 07/23/20 0548 07/24/20 0510  WBC 10.2  --   --  8.3  --  8.8 12.2*  NEUTROABS 5.6  --   --   --   --   --   --   HGB 7.0*   < > 7.1* 5.7* 9.5* 9.9* 11.5*  HCT 23.1*   < > 21.0* 19.1* 28.6* 30.3* 36.1  MCV 100.4*  --   --  99.5  --  92.9 94.5  PLT 408*  --   --  350  --  367 435*   < > = values in this interval not displayed.   Cardiac Enzymes: No results for input(s): CKTOTAL, CKMB, CKMBINDEX, TROPONINI in the last 168 hours. BNP: Invalid input(s): POCBNP CBG: Recent Labs  Lab 07/24/20 0639 07/24/20 0838 07/24/20 0924 07/24/20 1012 07/24/20 1127  GLUCAP 61* 68* 94 94 103*   D-Dimer No results for input(s): DDIMER in the last 72 hours. Hgb A1c Recent Labs    07/21/20 1327  HGBA1C 8.7*   Lipid Profile No results for input(s): CHOL, HDL, LDLCALC, TRIG, CHOLHDL, LDLDIRECT in the last 72 hours. Thyroid function studies No results for input(s): TSH, T4TOTAL, T3FREE, THYROIDAB in the  last 72 hours.  Invalid input(s): FREET3 Anemia work up Recent Labs    07/21/20 1327  FOLATE 59.0  FERRITIN 1,016*  TIBC 262  IRON 45   Microbiology Recent Results (from the past 240 hour(s))  Respiratory Panel by RT PCR (Flu A&B, Covid) - Nasopharyngeal Swab     Status: None   Collection Time: 07/21/20  1:47 AM   Specimen: Nasopharyngeal Swab  Result Value Ref Range Status   SARS Coronavirus 2 by RT PCR NEGATIVE NEGATIVE Final    Comment: (NOTE) SARS-CoV-2 target nucleic acids are NOT DETECTED.  The SARS-CoV-2 RNA is generally detectable in upper respiratoy specimens during the acute phase of infection. The lowest concentration of SARS-CoV-2 viral copies this assay can detect is 131 copies/mL. A negative result does not preclude SARS-Cov-2 infection and should not be used as the sole basis for treatment or other patient management decisions. A negative result may occur with  improper specimen collection/handling, submission of specimen other than nasopharyngeal swab, presence of viral mutation(s) within the areas targeted by this assay, and inadequate number of viral copies (<131 copies/mL). A negative result must be combined with clinical observations, patient history, and epidemiological information. The expected result is Negative.  Fact Sheet for Patients:  PinkCheek.be  Fact Sheet for Healthcare Providers:  GravelBags.it  This test is no t yet approved or cleared by the [place] FDA and  has been authorized for detection and/or diagnosis of SARS-CoV-2 by FDA under an Emergency Use Authorization (EUA). This EUA will remain  in effect (meaning this test can be used) for the duration of the COVID-19 declaration under Section 564(b)(1) of the Act, 21 U.S.C. section 360bbb-3(b)(1), unless the authorization is terminated or revoked sooner.     Influenza A by PCR NEGATIVE NEGATIVE Final   Influenza B by  PCR NEGATIVE NEGATIVE Final    Comment: (NOTE) The Xpert Xpress SARS-CoV-2/FLU/RSV assay is intended as an aid in  the diagnosis of influenza from Nasopharyngeal swab specimens and  should not be used as a sole basis for treatment. Nasal washings and  aspirates are unacceptable for Xpert Xpress SARS-CoV-2/FLU/RSV  testing.  Fact Sheet for Patients: PinkCheek.be  Fact Sheet for Healthcare Providers: GravelBags.it  This test is not yet approved or cleared by the [place] and  has been authorized for detection and/or diagnosis of SARS-CoV-2 by  FDA under an Emergency Use Authorization (EUA). This EUA will remain  in effect (meaning this test can be used) for the duration of the  Covid-19 declaration under Section 564(b)(1) of the Act, 21  U.S.C. section 360bbb-3(b)(1), unless the authorization is  terminated or revoked. Performed at [facility], [place] [address] [place], [place] [address]      Discharge Instructions:   Discharge Instructions     Diet - low sodium heart healthy   Complete by: As directed    Diabetic diet   Discharge instructions   Complete by: As directed    Follow up with your primary care physician in one week. Follow up with GI in 6 weeks for repeat endoscopy. Take medications as prescribed. Continue hemodialysis as scheduled. Do not take aspirin until seen by your GI doctor.   Increase activity slowly   Complete by: As directed    No wound care   Complete by: As directed       Allergies as of 07/24/2020   No Known Allergies      Medication List     STOP taking these medications    aspirin EC 81 MG tablet   rosuvastatin 20 MG tablet Commonly known as: CRESTOR       TAKE these medications    acetaminophen 500 MG tablet Commonly known as: TYLENOL Take 500 mg by mouth every 6 (six) hours as needed for moderate pain or headache.   amLODipine 10 MG tablet Commonly  known as: NORVASC Take 10 mg by mouth daily.   Auryxia 1 GM 210 MG(Fe) tablet Generic drug: ferric citrate Take 630 mg by mouth in the morning, at noon, and at bedtime.   calcitRIOL 0.5 MCG capsule Commonly known as: ROCALTROL Take 5 capsules (2.5 mcg total) by mouth Every Tuesday,Thursday,and Saturday with dialysis.   insulin detemir 100 UNIT/ML FlexPen Commonly known as: LEVEMIR Inject 18 Units into the skin daily with breakfast. What changed: when to take this   Insulin Pen Needle 32G X 4 MM Misc Use with insulin pen to dispense insulin as directed   multivitamin Tabs tablet Take 1 tablet by mouth daily.   pantoprazole 40 MG tablet Commonly known as: PROTONIX Take 1 tablet (40 mg total) by mouth 2 (two) times daily before a meal.               Durable Medical Equipment  (From admission, onward)           Start     Ordered   07/21/20 0451  For home use only DME oxygen  Once       Question Answer Comment  Length of Need 6 Months   Mode or (Route) Nasal cannula   Liters per Minute 2   Frequency Continuous (stationary and portable oxygen unit needed)   Oxygen conserving device Yes   Oxygen delivery system Gas      07/21/20 0451            Follow-up Information     Sonia Side., FNP. Schedule an appointment as soon as possible for a visit in 1 week(s).   Specialty: Family Medicine Why: regular followup Contact information: [place] [address] [phone]         Doran Stabler, MD. Schedule an appointment as soon as  possible for a visit.   Specialty: Gastroenterology Why: 6 weeks for repeat endoscopy Contact information: [place] [address] [phone]                  Time coordinating discharge: 39 minutes  Signed:  Joran Kallal  [facility] 07/24/2020, 12:28 PM

## 2020-07-25 ENCOUNTER — Telehealth: Payer: Self-pay | Admitting: Nephrology

## 2020-07-25 ENCOUNTER — Encounter (HOSPITAL_COMMUNITY): Payer: Self-pay | Admitting: Gastroenterology

## 2020-07-25 LAB — SURGICAL PATHOLOGY

## 2020-07-25 NOTE — Telephone Encounter (Signed)
Transition of care contact from inpatient facility  Date of discharge: 07/24/20 Date of contact: 07/25/20 Method: Phone Spoke to: Patient  Patient contacted to discuss transition of care from recent inpatient hospitalization. Patient was admitted to [facility] from 9/25-9/28/21 with discharge diagnosis of volume overload and symptomatic anemia  Medication changes were reviewed. Hasn't picked up Protonix yet, waiting for pharmacy notification  Patient will follow up with his/her outpatient HD unit on: Thursday 9/30

## 2020-07-26 ENCOUNTER — Other Ambulatory Visit: Payer: Self-pay

## 2020-07-26 ENCOUNTER — Encounter: Payer: Self-pay | Admitting: Gastroenterology

## 2020-07-26 ENCOUNTER — Telehealth: Payer: Self-pay

## 2020-07-26 DIAGNOSIS — K259 Gastric ulcer, unspecified as acute or chronic, without hemorrhage or perforation: Secondary | ICD-10-CM | POA: Insufficient documentation

## 2020-07-26 MED ORDER — BISMUTH SUBSALICYLATE 262 MG PO TABS: 2.0000 | ORAL_TABLET | Freq: Four times a day (QID) | ORAL | 0 refills | Status: AC

## 2020-07-26 MED ORDER — DOXYCYCLINE HYCLATE 100 MG PO CAPS: 100.0000 mg | ORAL_CAPSULE | Freq: Two times a day (BID) | ORAL | 0 refills | Status: AC

## 2020-07-26 MED ORDER — METRONIDAZOLE 250 MG PO TABS: 250.0000 mg | ORAL_TABLET | Freq: Four times a day (QID) | ORAL | 0 refills | Status: AC

## 2020-07-26 NOTE — Telephone Encounter (Signed)
The pt has been given the pathology results.  Prescription sent to the pharmacy. Appt made to see Dr Ardis Hughs 11/30 and recall entered.

## 2020-08-30 DIAGNOSIS — R1084 Generalized abdominal pain: Secondary | ICD-10-CM | POA: Insufficient documentation

## 2020-09-25 ENCOUNTER — Ambulatory Visit: Payer: Medicare Other | Admitting: Gastroenterology

## 2020-12-01 DIAGNOSIS — R519 Headache, unspecified: Secondary | ICD-10-CM | POA: Insufficient documentation

## 2021-01-17 DIAGNOSIS — T829XXA Unspecified complication of cardiac and vascular prosthetic device, implant and graft, initial encounter: Secondary | ICD-10-CM | POA: Insufficient documentation

## 2021-01-24 ENCOUNTER — Other Ambulatory Visit: Payer: Self-pay

## 2021-01-24 DIAGNOSIS — N184 Chronic kidney disease, stage 4 (severe): Secondary | ICD-10-CM

## 2021-02-04 ENCOUNTER — Ambulatory Visit (INDEPENDENT_AMBULATORY_CARE_PROVIDER_SITE_OTHER)
Admission: RE | Admit: 2021-02-04 | Discharge: 2021-02-04 | Disposition: A | Discharge status: Home / Self Care | Payer: Medicare Other | Source: Ambulatory Visit | Attending: Vascular Surgery | Admitting: Vascular Surgery

## 2021-02-04 ENCOUNTER — Other Ambulatory Visit: Payer: Self-pay

## 2021-02-04 ENCOUNTER — Ambulatory Visit (INDEPENDENT_AMBULATORY_CARE_PROVIDER_SITE_OTHER): Payer: Medicare Other | Admitting: Vascular Surgery

## 2021-02-04 ENCOUNTER — Encounter: Payer: Self-pay | Admitting: Vascular Surgery

## 2021-02-04 ENCOUNTER — Ambulatory Visit (HOSPITAL_COMMUNITY)
Admission: RE | Admit: 2021-02-04 | Discharge: 2021-02-04 | Disposition: A | Discharge status: Home / Self Care | Payer: Medicare Other | Source: Ambulatory Visit | Attending: Vascular Surgery | Admitting: Vascular Surgery

## 2021-02-04 VITALS — BP 212/96 | HR 87 | Temp 98.1°F | Resp 20 | Ht 64.0 in | Wt 169.0 lb

## 2021-02-04 DIAGNOSIS — N184 Chronic kidney disease, stage 4 (severe): Secondary | ICD-10-CM

## 2021-02-04 DIAGNOSIS — N186 End stage renal disease: Secondary | ICD-10-CM

## 2021-02-04 NOTE — H&P (View-Only) (Signed)
Patient ID: Melissa Howe, female   DOB: [birth year], 70 y.o.   MRN: [id number]  Reason for Consult: No chief complaint on file.   Referred by Sonia Side., FNP  Subjective:     HPI:  Melissa Howe is a 70 y.o. female with history of end-stage renal disease previously on dialysis via left forearm AV graft.  She now has a catheter.  She is right-hand dominant.  She is not on any blood thinners.  She denies any left chest, breast, upper extremity surgery other than her graft.  She currently dialyzes Tuesdays, Thursdays and Saturdays next-door.  She does not have any left hand symptoms  Past Medical History:  Diagnosis Date  . Chronic kidney disease   . Complication of anesthesia   . Constipation   . Diabetes mellitus    Type II  . Dyspnea    with exertion  . Hyperlipemia   . Hypertension   . Pneumonia 2019  . PONV (postoperative nausea and vomiting)    x 1    Family History  Problem Relation Age of Onset  . Hypertension Mother   . Diabetes Mother   . Hypertension Sister    Past Surgical History:  Procedure Laterality Date  . APPENDECTOMY    . AV FISTULA PLACEMENT Left 10/05/2019   Procedure: INSERTION OF ARTERIOVENOUS (AV) GORE-TEX VASCULAR GRAFT LEFT ARM;  Surgeon: [name], MD;  Location: [place];  Service: Vascular;  Laterality: Left;  . BIOPSY  07/24/2020   Procedure: BIOPSY;  Surgeon: Doran Stabler, MD;  Location: [place];  Service: Gastroenterology;;  . Kathleen Argue STUDY  01/02/2020   Procedure: BUBBLE STUDY;  Surgeon: Elouise Munroe, MD;  Location: [place];  Service: Cardiology;;  . COLONOSCOPY WITH PROPOFOL N/A 07/24/2020   Procedure: COLONOSCOPY WITH PROPOFOL;  Surgeon: Doran Stabler, MD;  Location: [place];  Service: Gastroenterology;  Laterality: N/A;  . ESOPHAGOGASTRODUODENOSCOPY (EGD) WITH PROPOFOL N/A 07/24/2020   Procedure: ESOPHAGOGASTRODUODENOSCOPY (EGD) WITH PROPOFOL;  Surgeon: Doran Stabler, MD;  Location: [place];  Service: Gastroenterology;  Laterality: N/A;  . EYE SURGERY Bilateral    Cataract  . FOREIGN BODY REMOVAL Left 05/13/2018   Procedure: FOREIGN BODY REMOVAL left foot;  Surgeon: Meredith Pel, MD;  Location: [place];  Service: Orthopedics;  Laterality: Left;  . I & D EXTREMITY Left 05/21/2018   Procedure: LEFT FOOT DEBRIDEMENT AND WOUND CLOSURE;  Surgeon: Newt Minion, MD;  Location: [place];  Service: Orthopedics;  Laterality: Left;  . POLYPECTOMY  07/24/2020   Procedure: POLYPECTOMY;  Surgeon: Doran Stabler, MD;  Location: [place];  Service: Gastroenterology;;  . TEE WITHOUT CARDIOVERSION N/A 01/02/2020   Procedure: TRANSESOPHAGEAL ECHOCARDIOGRAM (TEE);  Surgeon: Elouise Munroe, MD;  Location: [place];  Service: Cardiology;  Laterality: N/A;  . TONSILLECTOMY    . TUBAL LIGATION      Short Social History:  Social History   Tobacco Use  . Smoking status: Former Smoker    Years: 4.00    Types: Cigarettes    Quit date: 01/19/1984    Years since quitting: 37.0  . Smokeless tobacco: Never Used  Substance Use Topics  . Alcohol use: No    No Known Allergies  Current Outpatient Medications  Medication Sig Dispense Refill  . acetaminophen (TYLENOL) 500 MG tablet Take 500 mg by mouth every 6 (six) hours as needed for moderate pain or headache.    Marland Kitchen  amLODipine (NORVASC) 10 MG tablet Take 10 mg by mouth daily.    Lorin Picket 1 GM 210 MG(Fe) tablet Take 630 mg by mouth in the morning, at noon, and at bedtime.     . calcitRIOL (ROCALTROL) 0.5 MCG capsule Take 5 capsules (2.5 mcg total) by mouth Every Tuesday,Thursday,and Saturday with dialysis. 60 capsule 2  . Insulin Detemir (LEVEMIR) 100 UNIT/ML Pen Inject 18 Units into the skin daily with breakfast. (Patient taking differently: Inject 18 Units into the skin at bedtime. ) 15 mL 1  . Insulin Pen Needle 32G X 4 MM MISC Use with insulin pen to dispense insulin as directed 100 each 1  . multivitamin (RENA-VIT) TABS  tablet Take 1 tablet by mouth daily.    . pantoprazole (PROTONIX) 40 MG tablet Take 1 tablet (40 mg total) by mouth 2 (two) times daily before a meal. 84 tablet 0   No current facility-administered medications for this visit.    Review of Systems  Constitutional:  Constitutional negative. HENT: HENT negative.  Eyes: Eyes negative.  Respiratory: Respiratory negative.  Cardiovascular: Cardiovascular negative.  GI: Gastrointestinal negative.  Musculoskeletal: Musculoskeletal negative.  Skin: Skin negative.  Neurological: Neurological negative. Hematologic: Hematologic/lymphatic negative.  Psychiatric: Psychiatric negative.        Objective:  Objective   Vitals:   02/04/21 1546  BP: (!) 212/96  Pulse: 87  Resp: 20  Temp: 98.1 F (36.7 C)  SpO2: 98%    Physical Exam HENT:     Head: Normocephalic.     Nose:     Comments: Mask in place Eyes:     Pupils: Pupils are equal, round, and reactive to light.  Cardiovascular:     Pulses:          Radial pulses are 2+ on the right side and 2+ on the left side.  Abdominal:     Palpations: Abdomen is soft.  Musculoskeletal:        General: Normal range of motion.     Comments: Left forearm graft, no thrill present  Skin:    General: Skin is warm.  Neurological:     General: No focal deficit present.     Mental Status: She is alert.  Psychiatric:        Mood and Affect: Mood normal.        Behavior: Behavior normal.        Thought Content: Thought content normal.        Judgment: Judgment normal.     Data: Right Pre-Dialysis Findings:  +-----------------------+----------+--------------------+---------+--------  +  Location        PSV (cm/s)Intralum. Diam. (cm)Waveform  Comments  +-----------------------+----------+--------------------+---------+--------  +  Brachial Antecub. [id number]    0.46        triphasic         +-----------------------+----------+--------------------+---------+--------  +  Radial Art at Wrist  109    0.13        triphasic        +-----------------------+----------+--------------------+---------+--------  +  Ulnar Art at Wrist   102    0.16        triphasic        +-----------------------+----------+--------------------+---------+--------  +     Left Pre-Dialysis Findings:  +-----------------------+----------+--------------------+---------+--------  +  Location        PSV (cm/s)Intralum. Diam. (cm)Waveform  Comments  +-----------------------+----------+--------------------+---------+--------  +  Brachial Antecub. [id number]    0.43        triphasic        +-----------------------+----------+--------------------+---------+--------  +  Radial Art at Wrist  94    0.16        triphasic        +-----------------------+----------+--------------------+---------+--------  +  Ulnar Art at Wrist   121    0.17        triphasic        +-----------------------+----------+--------------------+---------+--------  +  +-----------------+-------------+----------+---------+  Right Cephalic  Diameter (cm)Depth (cm)Findings   +-----------------+-------------+----------+---------+  Shoulder       0.29     1.56         +-----------------+-------------+----------+---------+  Prox upper arm    0.28     0.96         +-----------------+-------------+----------+---------+  Mid upper arm    0.21     0.71  branching  +-----------------+-------------+----------+---------+  Dist upper arm    0.23     0.65         +-----------------+-------------+----------+---------+  Antecubital fossa  0.16     0.68  branching  +-----------------+-------------+----------+---------+  Prox forearm      0.11     0.40         +-----------------+-------------+----------+---------+  Mid forearm     0.11     0.19         +-----------------+-------------+----------+---------+  Dist forearm     0.10     0.20         +-----------------+-------------+----------+---------+  Wrist        0.08     0.25         +-----------------+-------------+----------+---------+   +-----------------+-------------+----------+---------+  Right Basilic  Diameter (cm)Depth (cm)Findings   +-----------------+-------------+----------+---------+  Prox upper arm    0.48     2.30  branching  +-----------------+-------------+----------+---------+  Mid upper arm    0.55     1.53  branching  +-----------------+-------------+----------+---------+  Dist upper arm    0.48     0.95         +-----------------+-------------+----------+---------+  Antecubital fossa  0.43     0.85  branching  +-----------------+-------------+----------+---------+  Prox forearm     0.39     0.24         +-----------------+-------------+----------+---------+  Mid forearm     0.33     0.27         +-----------------+-------------+----------+---------+  Distal forearm    0.26     0.23         +-----------------+-------------+----------+---------+  Wrist        0.18     0.28         +-----------------+-------------+----------+---------+   +-----------------+-------------+----------+--------------+  Left Cephalic  Diameter (cm)Depth (cm)  Findings    +-----------------+-------------+----------+--------------+  Shoulder       0.27     0.92           +-----------------+-------------+----------+--------------+  Prox upper arm    0.31     0.64            +-----------------+-------------+----------+--------------+  Mid upper arm    0.16     0.75   branching    +-----------------+-------------+----------+--------------+  Dist upper arm    0.16     0.83           +-----------------+-------------+----------+--------------+  Antecubital fossa  0.09     0.40           +-----------------+-------------+----------+--------------+  Prox forearm     0.09     0.59           +-----------------+-------------+----------+--------------+  Mid forearm     0.11     0.45           +-----------------+-------------+----------+--------------+  Dist forearm     0.10     0.37           +-----------------+-------------+----------+--------------+  Wrist                  not visualized  +-----------------+-------------+----------+--------------+   +-----------------+-------------+----------+---------+  Left Basilic   Diameter (cm)Depth (cm)Findings   +-----------------+-------------+----------+---------+  Prox upper arm    0.44     1.34         +-----------------+-------------+----------+---------+  Mid upper arm    0.44     1.81         +-----------------+-------------+----------+---------+  Dist upper arm    0.51     1.09         +-----------------+-------------+----------+---------+  Antecubital fossa  0.32     0.34  branching  +-----------------+-------------+----------+---------+  Prox forearm     0.32     0.25         +-----------------+-------------+----------+---------+  Mid forearm     0.26     0.19         +-----------------+-------------+----------+---------+  Distal forearm    0.16     0.21         +-----------------+-------------+----------+---------+  Wrist        0.12     0.16          +-----------------+-------------+----------+---------+      Assessment/Plan:     70 year old female with end-stage renal disease dialyzing via catheter on Tuesdays, Thursdays and Saturdays.  Previous left forearm graft has thrombosed.  We will plan for left upper arm basilic vein fistula versus graft.  I discussed that fistula require 2 procedures.  I discussed the risk and benefits including steal, nerve injury, primary nonfunction and she demonstrates good understanding.  We will get her scheduled on a nondialysis day in the near future.     Waynetta Sandy MD [facility]

## 2021-02-04 NOTE — Progress Notes (Signed)
Patient ID: Melissa Howe, female   DOB: [birth year], 70 y.o.   MRN: [id number]  Reason for Consult: No chief complaint on file.   Referred by Sonia Side., FNP  Subjective:     HPI:  Melissa Howe is a 70 y.o. female with history of end-stage renal disease previously on dialysis via left forearm AV graft.  She now has a catheter.  She is right-hand dominant.  She is not on any blood thinners.  She denies any left chest, breast, upper extremity surgery other than her graft.  She currently dialyzes Tuesdays, Thursdays and Saturdays next-door.  She does not have any left hand symptoms  Past Medical History:  Diagnosis Date  . Chronic kidney disease   . Complication of anesthesia   . Constipation   . Diabetes mellitus    Type II  . Dyspnea    with exertion  . Hyperlipemia   . Hypertension   . Pneumonia 2019  . PONV (postoperative nausea and vomiting)    x 1    Family History  Problem Relation Age of Onset  . Hypertension Mother   . Diabetes Mother   . Hypertension Sister    Past Surgical History:  Procedure Laterality Date  . APPENDECTOMY    . AV FISTULA PLACEMENT Left 10/05/2019   Procedure: INSERTION OF ARTERIOVENOUS (AV) GORE-TEX VASCULAR GRAFT LEFT ARM;  Surgeon: [name], MD;  Location: [place];  Service: Vascular;  Laterality: Left;  . BIOPSY  07/24/2020   Procedure: BIOPSY;  Surgeon: Doran Stabler, MD;  Location: [place];  Service: Gastroenterology;;  . Kathleen Argue STUDY  01/02/2020   Procedure: BUBBLE STUDY;  Surgeon: Elouise Munroe, MD;  Location: [name];  Service: Cardiology;;  . COLONOSCOPY WITH PROPOFOL N/A 07/24/2020   Procedure: COLONOSCOPY WITH PROPOFOL;  Surgeon: Doran Stabler, MD;  Location: [place];  Service: Gastroenterology;  Laterality: N/A;  . ESOPHAGOGASTRODUODENOSCOPY (EGD) WITH PROPOFOL N/A 07/24/2020   Procedure: ESOPHAGOGASTRODUODENOSCOPY (EGD) WITH PROPOFOL;  Surgeon: Doran Stabler, MD;  Location: [place];  Service: Gastroenterology;  Laterality: N/A;  . EYE SURGERY Bilateral    Cataract  . FOREIGN BODY REMOVAL Left 05/13/2018   Procedure: FOREIGN BODY REMOVAL left foot;  Surgeon: Meredith Pel, MD;  Location: [place];  Service: Orthopedics;  Laterality: Left;  . I & D EXTREMITY Left 05/21/2018   Procedure: LEFT FOOT DEBRIDEMENT AND WOUND CLOSURE;  Surgeon: Newt Minion, MD;  Location: [place];  Service: Orthopedics;  Laterality: Left;  . POLYPECTOMY  07/24/2020   Procedure: POLYPECTOMY;  Surgeon: Doran Stabler, MD;  Location: [place];  Service: Gastroenterology;;  . TEE WITHOUT CARDIOVERSION N/A 01/02/2020   Procedure: TRANSESOPHAGEAL ECHOCARDIOGRAM (TEE);  Surgeon: Elouise Munroe, MD;  Location: [place];  Service: Cardiology;  Laterality: N/A;  . TONSILLECTOMY    . TUBAL LIGATION      Short Social History:  Social History   Tobacco Use  . Smoking status: Former Smoker    Years: 4.00    Types: Cigarettes    Quit date: 01/19/1984    Years since quitting: 37.0  . Smokeless tobacco: Never Used  Substance Use Topics  . Alcohol use: No    No Known Allergies  Current Outpatient Medications  Medication Sig Dispense Refill  . acetaminophen (TYLENOL) 500 MG tablet Take 500 mg by mouth every 6 (six) hours as needed for moderate pain or headache.    Marland Kitchen  amLODipine (NORVASC) 10 MG tablet Take 10 mg by mouth daily.    Lorin Picket 1 GM 210 MG(Fe) tablet Take 630 mg by mouth in the morning, at noon, and at bedtime.     . calcitRIOL (ROCALTROL) 0.5 MCG capsule Take 5 capsules (2.5 mcg total) by mouth Every Tuesday,Thursday,and Saturday with dialysis. 60 capsule 2  . Insulin Detemir (LEVEMIR) 100 UNIT/ML Pen Inject 18 Units into the skin daily with breakfast. (Patient taking differently: Inject 18 Units into the skin at bedtime. ) 15 mL 1  . Insulin Pen Needle 32G X 4 MM MISC Use with insulin pen to dispense insulin as directed 100 each 1  . multivitamin (RENA-VIT) TABS  tablet Take 1 tablet by mouth daily.    . pantoprazole (PROTONIX) 40 MG tablet Take 1 tablet (40 mg total) by mouth 2 (two) times daily before a meal. 84 tablet 0   No current facility-administered medications for this visit.    Review of Systems  Constitutional:  Constitutional negative. HENT: HENT negative.  Eyes: Eyes negative.  Respiratory: Respiratory negative.  Cardiovascular: Cardiovascular negative.  GI: Gastrointestinal negative.  Musculoskeletal: Musculoskeletal negative.  Skin: Skin negative.  Neurological: Neurological negative. Hematologic: Hematologic/lymphatic negative.  Psychiatric: Psychiatric negative.        Objective:  Objective   Vitals:   02/04/21 1546  BP: (!) 212/96  Pulse: 87  Resp: 20  Temp: 98.1 F (36.7 C)  SpO2: 98%    Physical Exam HENT:     Head: Normocephalic.     Nose:     Comments: Mask in place Eyes:     Pupils: Pupils are equal, round, and reactive to light.  Cardiovascular:     Pulses:          Radial pulses are 2+ on the right side and 2+ on the left side.  Abdominal:     Palpations: Abdomen is soft.  Musculoskeletal:        General: Normal range of motion.     Comments: Left forearm graft, no thrill present  Skin:    General: Skin is warm.  Neurological:     General: No focal deficit present.     Mental Status: She is alert.  Psychiatric:        Mood and Affect: Mood normal.        Behavior: Behavior normal.        Thought Content: Thought content normal.        Judgment: Judgment normal.     Data: Right Pre-Dialysis Findings:  +-----------------------+----------+--------------------+---------+--------  +  Location        PSV (cm/s)Intralum. Diam. (cm)Waveform  Comments  +-----------------------+----------+--------------------+---------+--------  +  Brachial Antecub. [id number]    0.46        triphasic         +-----------------------+----------+--------------------+---------+--------  +  Radial Art at Wrist  109    0.13        triphasic        +-----------------------+----------+--------------------+---------+--------  +  Ulnar Art at Wrist   102    0.16        triphasic        +-----------------------+----------+--------------------+---------+--------  +     Left Pre-Dialysis Findings:  +-----------------------+----------+--------------------+---------+--------  +  Location        PSV (cm/s)Intralum. Diam. (cm)Waveform  Comments  +-----------------------+----------+--------------------+---------+--------  +  Brachial Antecub. [id number]    0.43        triphasic        +-----------------------+----------+--------------------+---------+--------  +  Radial Art at Wrist  94    0.16        triphasic        +-----------------------+----------+--------------------+---------+--------  +  Ulnar Art at Wrist   121    0.17        triphasic        +-----------------------+----------+--------------------+---------+--------  +  +-----------------+-------------+----------+---------+  Right Cephalic  Diameter (cm)Depth (cm)Findings   +-----------------+-------------+----------+---------+  Shoulder       0.29     1.56         +-----------------+-------------+----------+---------+  Prox upper arm    0.28     0.96         +-----------------+-------------+----------+---------+  Mid upper arm    0.21     0.71  branching  +-----------------+-------------+----------+---------+  Dist upper arm    0.23     0.65         +-----------------+-------------+----------+---------+  Antecubital fossa  0.16     0.68  branching  +-----------------+-------------+----------+---------+  Prox forearm      0.11     0.40         +-----------------+-------------+----------+---------+  Mid forearm     0.11     0.19         +-----------------+-------------+----------+---------+  Dist forearm     0.10     0.20         +-----------------+-------------+----------+---------+  Wrist        0.08     0.25         +-----------------+-------------+----------+---------+   +-----------------+-------------+----------+---------+  Right Basilic  Diameter (cm)Depth (cm)Findings   +-----------------+-------------+----------+---------+  Prox upper arm    0.48     2.30  branching  +-----------------+-------------+----------+---------+  Mid upper arm    0.55     1.53  branching  +-----------------+-------------+----------+---------+  Dist upper arm    0.48     0.95         +-----------------+-------------+----------+---------+  Antecubital fossa  0.43     0.85  branching  +-----------------+-------------+----------+---------+  Prox forearm     0.39     0.24         +-----------------+-------------+----------+---------+  Mid forearm     0.33     0.27         +-----------------+-------------+----------+---------+  Distal forearm    0.26     0.23         +-----------------+-------------+----------+---------+  Wrist        0.18     0.28         +-----------------+-------------+----------+---------+   +-----------------+-------------+----------+--------------+  Left Cephalic  Diameter (cm)Depth (cm)  Findings    +-----------------+-------------+----------+--------------+  Shoulder       0.27     0.92           +-----------------+-------------+----------+--------------+  Prox upper arm    0.31     0.64            +-----------------+-------------+----------+--------------+  Mid upper arm    0.16     0.75   branching    +-----------------+-------------+----------+--------------+  Dist upper arm    0.16     0.83           +-----------------+-------------+----------+--------------+  Antecubital fossa  0.09     0.40           +-----------------+-------------+----------+--------------+  Prox forearm     0.09     0.59           +-----------------+-------------+----------+--------------+  Mid forearm     0.11     0.45           +-----------------+-------------+----------+--------------+  Dist forearm     0.10     0.37           +-----------------+-------------+----------+--------------+  Wrist                  not visualized  +-----------------+-------------+----------+--------------+   +-----------------+-------------+----------+---------+  Left Basilic   Diameter (cm)Depth (cm)Findings   +-----------------+-------------+----------+---------+  Prox upper arm    0.44     1.34         +-----------------+-------------+----------+---------+  Mid upper arm    0.44     1.81         +-----------------+-------------+----------+---------+  Dist upper arm    0.51     1.09         +-----------------+-------------+----------+---------+  Antecubital fossa  0.32     0.34  branching  +-----------------+-------------+----------+---------+  Prox forearm     0.32     0.25         +-----------------+-------------+----------+---------+  Mid forearm     0.26     0.19         +-----------------+-------------+----------+---------+  Distal forearm    0.16     0.21         +-----------------+-------------+----------+---------+  Wrist        0.12     0.16          +-----------------+-------------+----------+---------+      Assessment/Plan:     70 year old female with end-stage renal disease dialyzing via catheter on Tuesdays, Thursdays and Saturdays.  Previous left forearm graft has thrombosed.  We will plan for left upper arm basilic vein fistula versus graft.  I discussed that fistula require 2 procedures.  I discussed the risk and benefits including steal, nerve injury, primary nonfunction and she demonstrates good understanding.  We will get her scheduled on a nondialysis day in the near future.     Waynetta Sandy MD [facility]

## 2021-02-25 ENCOUNTER — Other Ambulatory Visit (HOSPITAL_COMMUNITY)
Admission: RE | Admit: 2021-02-25 | Discharge: 2021-02-25 | Disposition: A | Discharge status: Home / Self Care | Payer: Medicare Other | Source: Ambulatory Visit | Attending: Vascular Surgery | Admitting: Vascular Surgery

## 2021-02-25 DIAGNOSIS — Z20822 Contact with and (suspected) exposure to covid-19: Secondary | ICD-10-CM | POA: Insufficient documentation

## 2021-02-25 DIAGNOSIS — Z01812 Encounter for preprocedural laboratory examination: Secondary | ICD-10-CM | POA: Insufficient documentation

## 2021-02-26 ENCOUNTER — Other Ambulatory Visit: Payer: Self-pay

## 2021-02-26 ENCOUNTER — Encounter (HOSPITAL_COMMUNITY): Payer: Self-pay | Admitting: Vascular Surgery

## 2021-02-26 LAB — SARS CORONAVIRUS 2 (TAT 6-24 HRS): SARS Coronavirus 2: NEGATIVE

## 2021-02-26 NOTE — Anesthesia Preprocedure Evaluation (Addendum)
Anesthesia Evaluation  Patient identified by MRN, date of birth, ID band Patient awake    Reviewed: Allergy & Precautions, NPO status , Patient's Chart, lab work & pertinent test results  History of Anesthesia Complications (+) PONV  Airway Mallampati: II  TM Distance: >3 FB Neck ROM: Full    Dental  (+) Edentulous Upper, Dental Advisory Given   Pulmonary former smoker,  02/25/2021 SARS coronavirus NEG   breath sounds clear to auscultation       Cardiovascular hypertension, Pt. 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), Insulin Dependent  Renal/GU Renal InsufficiencyRenal diseaseK+ 4.2     Musculoskeletal   Abdominal (+) - obese,   Peds  Hematology negative hematology ROS (+)   Anesthesia Other Findings   Reproductive/Obstetrics                            Anesthesia Physical Anesthesia Plan  ASA: III  Anesthesia Plan: MAC and Regional   Post-op Pain Management:    Induction:   PONV Risk Score and Plan: 3 and Ondansetron  Airway Management Planned: Natural Airway and Simple Face Mask  Additional Equipment: None  Intra-op Plan:   Post-operative Plan:   Informed Consent: I have reviewed the patients History and Physical, chart, labs and discussed the procedure including the risks, benefits and alternatives for the proposed anesthesia with the patient or authorized representative who has indicated his/her understanding and acceptance.     Dental advisory given  Plan Discussed with: CRNA and Surgeon  Anesthesia Plan Comments:        Anesthesia Quick Evaluation

## 2021-02-26 NOTE — Progress Notes (Signed)
PCP - Dustin Folks, FNP Cardiologist - deneis  Chest x-ray - 07/23/20 EKG - 07/31/20 Stress Test - denies ECHO - 12/30/19 Cardiac Cath - denies  Fasting Blood Sugar:  100-200 Checks Blood Sugar:  2-3x/day  COVID TEST- 02/25/21 negative   Anesthesia review: n/a  -------------  SDW INSTRUCTIONS:  Your procedure is scheduled on 02/27/21. Please report to [facility] Main Entrance "A" at [address] and check in at the Admitting office. Call this number if you have problems the morning of surgery: [phone]   Remember: Do not eat or drink after midnight the night before your surgery   Medications to take morning of surgery with a sip of water include: acetaminophen (TYLENOL) if needed   Insulin Detemir (LEVEMIR) 5/3: 15 units, 5/4: 15 units ** PLEASE check your blood sugar the morning of your surgery when you wake up and every 2 hours until you get to the Short Stay unit.  If your blood sugar is less than 70 mg/dL, you will need to treat for low blood sugar: - Do not take insulin. - Treat a low blood sugar (less than 70 mg/dL) with  cup of clear juice (cranberry or apple), 4 glucose tablets, OR glucose gel. - Recheck blood sugar in 15 minutes after treatment (to make sure it is greater than 70 mg/dL). If your blood sugar is not greater than 70 mg/dL on recheck, call [phone] for further instructions.  As of today, STOP taking any Aspirin (unless otherwise instructed by your surgeon), Aleve, Naproxen, Ibuprofen, Motrin, Advil, Goody's, BC's, all herbal medications, fish oil, and all vitamins.    The Morning of Surgery Do not wear jewelry, make-up or nail polish. Do not wear lotions, powders, or perfumes, or deodorant Do not shave 48 hours prior to surgery.   Do not bring valuables to the hospital. [facility] is not responsible for any belongings or valuables. If you are a smoker, DO NOT Smoke 24 hours prior to surgery If you wear a CPAP at night please bring your mask the  morning of surgery  Remember that you must have someone to transport you home after your surgery, and remain with you for 24 hours if you are discharged the same day. Please bring cases for contacts, glasses, hearing aids, dentures or bridgework because it cannot be worn into surgery.   Patients discharged the day of surgery will not be allowed to drive home.   Please shower the NIGHT BEFORE SURGERY and the MORNING OF SURGERY with DIAL Soap. Wear comfortable clothes the morning of surgery. Oral Hygiene is also important to reduce your risk of infection.  Remember - BRUSH YOUR TEETH THE MORNING OF SURGERY WITH YOUR REGULAR TOOTHPASTE  Patient denies shortness of breath, fever, cough and chest pain.

## 2021-02-27 ENCOUNTER — Encounter (HOSPITAL_COMMUNITY): Payer: Self-pay | Admitting: Vascular Surgery

## 2021-02-27 ENCOUNTER — Ambulatory Visit (HOSPITAL_COMMUNITY): Payer: Medicare Other | Admitting: Anesthesiology

## 2021-02-27 ENCOUNTER — Ambulatory Visit (HOSPITAL_COMMUNITY)
Admission: RE | Admit: 2021-02-27 | Discharge: 2021-02-27 | Disposition: A | Discharge status: Home / Self Care | Payer: Medicare Other | Attending: Vascular Surgery | Admitting: Vascular Surgery

## 2021-02-27 ENCOUNTER — Encounter (HOSPITAL_COMMUNITY)
Admission: RE | Disposition: A | Discharge status: Home / Self Care | Payer: Self-pay | Source: Home / Self Care | Attending: Vascular Surgery

## 2021-02-27 DIAGNOSIS — Z992 Dependence on renal dialysis: Secondary | ICD-10-CM | POA: Diagnosis not present

## 2021-02-27 DIAGNOSIS — N186 End stage renal disease: Secondary | ICD-10-CM | POA: Insufficient documentation

## 2021-02-27 DIAGNOSIS — Z794 Long term (current) use of insulin: Secondary | ICD-10-CM | POA: Insufficient documentation

## 2021-02-27 DIAGNOSIS — I120 Hypertensive chronic kidney disease with stage 5 chronic kidney disease or end stage renal disease: Secondary | ICD-10-CM | POA: Insufficient documentation

## 2021-02-27 DIAGNOSIS — N185 Chronic kidney disease, stage 5: Secondary | ICD-10-CM

## 2021-02-27 DIAGNOSIS — E1122 Type 2 diabetes mellitus with diabetic chronic kidney disease: Secondary | ICD-10-CM | POA: Insufficient documentation

## 2021-02-27 DIAGNOSIS — Z87891 Personal history of nicotine dependence: Secondary | ICD-10-CM | POA: Diagnosis not present

## 2021-02-27 DIAGNOSIS — Z79899 Other long term (current) drug therapy: Secondary | ICD-10-CM | POA: Insufficient documentation

## 2021-02-27 HISTORY — PX: AV FISTULA PLACEMENT: SHX1204

## 2021-02-27 LAB — POCT I-STAT, CHEM 8
BUN: 34 mg/dL — ABNORMAL HIGH (ref 8–23)
Calcium, Ion: 1.07 mmol/L — ABNORMAL LOW (ref 1.15–1.40)
Chloride: 99 mmol/L (ref 98–111)
Creatinine, Ser: 5.5 mg/dL — ABNORMAL HIGH (ref 0.44–1.00)
Glucose, Bld: 265 mg/dL — ABNORMAL HIGH (ref 70–99)
HCT: 39 % (ref 36.0–46.0)
Hemoglobin: 13.3 g/dL (ref 12.0–15.0)
Potassium: 4.2 mmol/L (ref 3.5–5.1)
Sodium: 135 mmol/L (ref 135–145)
TCO2: 27 mmol/L (ref 22–32)

## 2021-02-27 LAB — GLUCOSE, CAPILLARY
Glucose-Capillary: 259 mg/dL — ABNORMAL HIGH (ref 70–99)
Glucose-Capillary: 229 mg/dL — ABNORMAL HIGH (ref 70–99)
Glucose-Capillary: 188 mg/dL — ABNORMAL HIGH (ref 70–99)

## 2021-02-27 SURGERY — ARTERIOVENOUS (AV) FISTULA CREATION
Anesthesia: Monitor Anesthesia Care | Site: Arm Upper | Laterality: Left

## 2021-02-27 MED ORDER — FENTANYL CITRATE (PF) 250 MCG/5ML IJ SOLN
INTRAMUSCULAR | Status: AC
  Filled 2021-02-27: qty 5

## 2021-02-27 MED ORDER — HEPARIN SODIUM (PORCINE) 1000 UNIT/ML IJ SOLN: 2.1000 mL | Freq: Once | INTRAMUSCULAR | Status: DC

## 2021-02-27 MED ORDER — INSULIN ASPART 100 UNIT/ML IJ SOLN
INTRAMUSCULAR | Status: AC
  Filled 2021-02-27: qty 1

## 2021-02-27 MED ORDER — 0.9 % SODIUM CHLORIDE (POUR BTL) OPTIME
TOPICAL | Status: DC | PRN
  Administered 2021-02-27: 1000 mL

## 2021-02-27 MED ORDER — MEPERIDINE HCL 25 MG/ML IJ SOLN: 6.2500 mg | INTRAMUSCULAR | Status: DC | PRN

## 2021-02-27 MED ORDER — MIDAZOLAM HCL 2 MG/2ML IJ SOLN
0.5000 mg | Freq: Once | INTRAMUSCULAR | Status: DC | PRN
Start: 2021-02-27 — End: 2021-02-27

## 2021-02-27 MED ORDER — FENTANYL CITRATE (PF) 100 MCG/2ML IJ SOLN: 25.0000 ug | INTRAMUSCULAR | Status: DC | PRN

## 2021-02-27 MED ORDER — CHLORHEXIDINE GLUCONATE 4 % EX LIQD: 60.0000 mL | Freq: Once | CUTANEOUS | Status: DC

## 2021-02-27 MED ORDER — FENTANYL CITRATE (PF) 100 MCG/2ML IJ SOLN
INTRAMUSCULAR | Status: DC | PRN
  Administered 2021-02-27: 50 ug via INTRAVENOUS

## 2021-02-27 MED ORDER — CEFAZOLIN SODIUM-DEXTROSE 2-4 GM/100ML-% IV SOLN
2.0000 g | INTRAVENOUS | Status: AC
  Administered 2021-02-27: 2 g via INTRAVENOUS
  Filled 2021-02-27: qty 100

## 2021-02-27 MED ORDER — DIPHENHYDRAMINE HCL 50 MG/ML IJ SOLN
INTRAMUSCULAR | Status: DC | PRN
  Administered 2021-02-27: 12.5 mg via INTRAVENOUS

## 2021-02-27 MED ORDER — OXYCODONE HCL 5 MG PO TABS: 5.0000 mg | ORAL_TABLET | Freq: Once | ORAL | Status: DC | PRN

## 2021-02-27 MED ORDER — CHLORHEXIDINE GLUCONATE 0.12 % MT SOLN: 15.0000 mL | Freq: Once | OROMUCOSAL | Status: AC

## 2021-02-27 MED ORDER — PHENYLEPHRINE HCL-NACL 10-0.9 MG/250ML-% IV SOLN
INTRAVENOUS | Status: DC | PRN
  Administered 2021-02-27: 50 ug/min via INTRAVENOUS

## 2021-02-27 MED ORDER — SODIUM CHLORIDE 0.9 % IV SOLN
INTRAVENOUS | Status: AC
  Filled 2021-02-27: qty 1.2

## 2021-02-27 MED ORDER — PROPOFOL 500 MG/50ML IV EMUL
INTRAVENOUS | Status: DC | PRN
  Administered 2021-02-27: 150 ug/kg/min via INTRAVENOUS

## 2021-02-27 MED ORDER — OXYCODONE HCL 5 MG/5ML PO SOLN: 5.0000 mg | Freq: Once | ORAL | Status: DC | PRN

## 2021-02-27 MED ORDER — INSULIN ASPART 100 UNIT/ML IJ SOLN
5.0000 [IU] | Freq: Once | INTRAMUSCULAR | Status: AC
  Administered 2021-02-27: 5 [IU] via SUBCUTANEOUS

## 2021-02-27 MED ORDER — LIDOCAINE-EPINEPHRINE (PF) 1.5 %-1:200000 IJ SOLN
INTRAMUSCULAR | Status: DC | PRN
  Administered 2021-02-27: 25 mL via PERINEURAL

## 2021-02-27 MED ORDER — PROMETHAZINE HCL 25 MG/ML IJ SOLN: 6.2500 mg | INTRAMUSCULAR | Status: DC | PRN

## 2021-02-27 MED ORDER — ORAL CARE MOUTH RINSE: 15.0000 mL | Freq: Once | OROMUCOSAL | Status: AC

## 2021-02-27 MED ORDER — CHLORHEXIDINE GLUCONATE 0.12 % MT SOLN
OROMUCOSAL | Status: AC
  Administered 2021-02-27: 15 mL via OROMUCOSAL
  Filled 2021-02-27: qty 15

## 2021-02-27 MED ORDER — HYDROCODONE-ACETAMINOPHEN 5-325 MG PO TABS: 1.0000 | ORAL_TABLET | Freq: Four times a day (QID) | ORAL | 0 refills | Status: DC | PRN

## 2021-02-27 MED ORDER — ONDANSETRON HCL 4 MG/2ML IJ SOLN
INTRAMUSCULAR | Status: DC | PRN
  Administered 2021-02-27: 4 mg via INTRAVENOUS

## 2021-02-27 MED ORDER — SODIUM CHLORIDE 0.9 % IV SOLN: INTRAVENOUS | Status: DC

## 2021-02-27 MED ORDER — SODIUM CHLORIDE 0.9 % IV SOLN
INTRAVENOUS | Status: DC | PRN
  Administered 2021-02-27: 500 mL

## 2021-02-27 SURGICAL SUPPLY — 30 items
ARMBAND PINK RESTRICT EXTREMIT (MISCELLANEOUS) ×2 IMPLANT
CANISTER SUCT 3000ML PPV (MISCELLANEOUS) ×2 IMPLANT
CLIP LIGATING EXTRA MED SLVR (CLIP) ×2 IMPLANT
CLIP LIGATING EXTRA SM BLUE (MISCELLANEOUS) ×2 IMPLANT
CLIP VESOCCLUDE MED 6/CT (CLIP) ×2 IMPLANT
CLIP VESOCCLUDE SM WIDE 6/CT (CLIP) ×2 IMPLANT
COVER PROBE W GEL 5X96 (DRAPES) ×2 IMPLANT
COVER WAND RF STERILE (DRAPES) IMPLANT
DERMABOND ADVANCED (GAUZE/BANDAGES/DRESSINGS) ×1
DERMABOND ADVANCED .7 DNX12 (GAUZE/BANDAGES/DRESSINGS) ×1 IMPLANT
ELECT REM PT RETURN 9FT ADLT (ELECTROSURGICAL) ×2
ELECTRODE REM PT RTRN 9FT ADLT (ELECTROSURGICAL) ×1 IMPLANT
GLOVE BIO SURGEON STRL SZ7.5 (GLOVE) ×2 IMPLANT
GOWN STRL REUS W/ TWL LRG LVL3 (GOWN DISPOSABLE) ×2 IMPLANT
GOWN STRL REUS W/ TWL XL LVL3 (GOWN DISPOSABLE) ×1 IMPLANT
GOWN STRL REUS W/TWL LRG LVL3 (GOWN DISPOSABLE) ×4
GOWN STRL REUS W/TWL XL LVL3 (GOWN DISPOSABLE) ×2
INSERT FOGARTY SM (MISCELLANEOUS) ×2 IMPLANT
KIT BASIN OR (CUSTOM PROCEDURE TRAY) ×2 IMPLANT
KIT TURNOVER KIT B (KITS) ×2 IMPLANT
NS IRRIG 1000ML POUR BTL (IV SOLUTION) ×2 IMPLANT
PACK CV ACCESS (CUSTOM PROCEDURE TRAY) ×2 IMPLANT
PAD ARMBOARD 7.5X6 YLW CONV (MISCELLANEOUS) ×4 IMPLANT
SUT MNCRL AB 4-0 PS2 18 (SUTURE) ×2 IMPLANT
SUT PROLENE 6 0 BV (SUTURE) ×4 IMPLANT
SUT VIC AB 3-0 SH 27 (SUTURE) ×2
SUT VIC AB 3-0 SH 27X BRD (SUTURE) ×1 IMPLANT
TOWEL GREEN STERILE (TOWEL DISPOSABLE) ×2 IMPLANT
UNDERPAD 30X36 HEAVY ABSORB (UNDERPADS AND DIAPERS) ×2 IMPLANT
WATER STERILE IRR 1000ML POUR (IV SOLUTION) ×2 IMPLANT

## 2021-02-27 NOTE — Progress Notes (Signed)
Orthopedic Tech Progress Note Patient Details:  Melissa Howe Sep 01, 1951 [id number] PACU RN called requesting an [place] for patient Ortho Devices Type of Ortho Device: Arm sling Ortho Device/Splint Location: LUE Ortho Device/Splint Interventions: Other (comment)   Post Interventions Patient Tolerated: Well Instructions Provided: Care of [place] 02/27/2021, 10:30 AM

## 2021-02-27 NOTE — Op Note (Signed)
    Patient name: Melissa Howe MRN: [id number] DOB: June 06, 1951 Sex: female  02/27/2021 Pre-operative Diagnosis: esrd Post-operative diagnosis:  Same Surgeon:  Erlene Quan C. Donzetta Matters, MD Assistant: Laurence Slate, PA Procedure Performed:  Left upper arm first stage basilic vein av fistula creation  Indications: 70 year old female with history of end-stage renal disease.  She currently dialyzes via catheter.  She has a previous left forearm graft.  She appears to have suitable basilic vein on the left.  She is right-hand dominant.  We have plan for left arm AV fistula in the upper arm versus graft.  An assistant was necessary to expedite the case.  Findings: By ultrasound there was a very diminutive cephalic vein above the antecubitum.  The basilic vein did branch a few centimeters above the antecubitum but was large.  By gross examination this was approximately 5 mm in diameter prior to the branching and was about 3-1/2 after that.  The brachial artery did have significant scarring with high brachial vein stent surrounding it.  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  After anastomosing the vein to the artery there was a strong thrill in the fistula there was a palpable radial artery pulse the wrist these were both confirmed with Doppler.   Procedure:  The patient was identified in the holding area and taken to the operating room where she was placed supine on the operative table.  Preoperative block of been placed.  MAC anesthesia was induced.  She was gently prepped and draped in the left upper extremity usual fashion, antibiotics were minister timeout was called.  Ultrasound was used to identify what appeared to be suitable basilic vein above the antecubitum.  The block was tested to be intact.  Curvilinear incision was made above the antecubitum.  We dissected out the vein dividing branches between clips and ties marked if orientation.  We dissected through the deep  fascia identified a stent in the brachial vein which was quite inflammatory surrounding it.  We then dissected deeper to the artery placed Vesseloops around this.  The vein was then clamped distally and transected and tied off.  We spatulated this and flushed with heparinized saline.  There was 1 valve that we excised.  We then clamped the artery distally and proximally and opened longitudinally.  We flushed with heparinized saline distally only.  We then sewed the vein end-to-side with 6-0 Prolene suture.  Prior completion low flushing all directions.  Point placed in the vein did significantly increase in size with good thrill and there was a palpable radial artery pulse the wrist although somewhat soft.  With this and with Doppler there was good signal in the fistula as well as the radial and ulnar arteries at the wrist.  There is no augmentation at the wrist with compression of the fistula.  We irrigated the wound and obtain hemostasis.  Closed in layers of Vicryl Monocryl.  Dermabond placed to level skin.  She was awakened from anesthesia having tolerated procedure without any complication.  Counts were correct at completion.  EBL: 25 cc   Jovan Colligan C. Donzetta Matters, MD [facility] [place] Office: [phone] Pager: [phone]

## 2021-02-27 NOTE — Anesthesia Postprocedure Evaluation (Signed)
Anesthesia Post Note  Patient: Melissa Howe  Procedure(s) Performed: LEFT ARM FIRST STAGE BASILIC VEIN  ARTERIOVENOUS (AV) FISTULA CREATION (Left Arm Upper)     Patient location during evaluation: PACU Anesthesia Type: MAC and Regional Level of consciousness: awake and alert, patient cooperative and awake Pain management: pain level controlled Vital Signs Assessment: post-procedure vital signs reviewed and stable Respiratory status: nonlabored ventilation, spontaneous breathing and respiratory function stable Cardiovascular status: blood pressure returned to baseline and stable Postop Assessment: no apparent nausea or vomiting and able to ambulate Anesthetic complications: no   No complications documented.  Last Vitals:  Vitals:   02/27/21 1005 02/27/21 1014  BP: (!) 141/70 (!) 148/73  Pulse: 85 87  Resp: 15 16  Temp:  36.6 C  SpO2: 98% 96%    Last Pain:  Vitals:   02/27/21 1014  TempSrc:   PainSc: 0-No pain                 Melissa Howe,E. Brandalyn Harting

## 2021-02-27 NOTE — Discharge Instructions (Addendum)
° °  [facility] [place] ° °Discharge Instructions ° °AV Fistula or Graft Surgery for Dialysis Access ° °Please refer to the following instructions for your post-procedure care. Your surgeon or physician assistant will discuss any changes with you. ° °Activity ° °You may drive the day following your surgery, if you are comfortable and no longer taking prescription pain medication. Resume full activity as the soreness in your incision resolves. ° °Bathing/Showering ° °You may shower after you go home. Keep your incision dry for 48 hours. Do not soak in a bathtub, hot tub, or swim until the incision heals completely. You may not shower if you have a hemodialysis catheter. ° °Incision Care ° °Clean your incision with mild soap and water after 48 hours. Pat the area dry with a clean towel. You do not need a bandage unless otherwise instructed. Do not apply any ointments or creams to your incision. You may have skin glue on your incision. Do not peel it off. It will come off on its own in about one week. Your arm may swell a bit after surgery. To reduce swelling use pillows to elevate your arm so it is above your heart. Your doctor will tell you if you need to lightly wrap your arm with an ACE bandage. ° °Diet ° °Resume your normal diet. There are not special food restrictions following this procedure. In order to heal from your surgery, it is CRITICAL to get adequate nutrition. Your body requires vitamins, minerals, and protein. Vegetables are the best source of vitamins and minerals. Vegetables also provide the perfect balance of protein. Processed food has little nutritional value, so try to avoid this. ° °Medications ° °Resume taking all of your medications. If your incision is causing pain, you may take over-the counter pain relievers such as acetaminophen (Tylenol). If you were prescribed a stronger pain medication, please be aware these medications can cause nausea and constipation. Prevent  nausea by taking the medication with a snack or meal. Avoid constipation by drinking plenty of fluids and eating foods with high amount of fiber, such as fruits, vegetables, and grains. Do not take Tylenol if you are taking prescription pain medications. ° ° ° ° °Follow up °Your surgeon may want to see you in the office following your access surgery. If so, this will be arranged at the time of your surgery. ° °Please call us immediately for any of the following conditions: ° °Increased pain, redness, drainage (pus) from your incision site °Fever of 101 degrees or higher °Severe or worsening pain at your incision site °Hand pain or numbness. ° °Reduce your risk of vascular disease: ° °Stop smoking. If you would like help, call [facility] at 1-800-QUIT-NOW (1-800-784-8669) or [place] at 336-586-4000 ° °Manage your cholesterol °Maintain a desired weight °Control your diabetes °Keep your blood pressure down ° °Dialysis ° °It will take several weeks to several months for your new dialysis access to be ready for use. Your surgeon will determine when it is OK to use it. Your nephrologist will continue to direct your dialysis. You can continue to use your Permcath until your new access is ready for use. ° °If you have any questions, please call the office at [phone]. ° °

## 2021-02-27 NOTE — Interval H&P Note (Signed)
History and Physical Interval Note:  02/27/2021 8:24 AM  Melissa Howe  has presented today for surgery, with the diagnosis of ESRD.  The various methods of treatment have been discussed with the patient and family. After consideration of risks, benefits and other options for treatment, the patient has consented to  Procedure(s): LEFT ARM ARTERIOVENOUS (AV) FISTULA VERSUS GRAFT CREATION (Left) as a surgical intervention.  The patient's history has been reviewed, patient examined, no change in status, stable for surgery.  I have reviewed the patient's chart and labs.  Questions were answered to the patient's satisfaction.     Servando Snare

## 2021-02-27 NOTE — Transfer of Care (Signed)
Immediate Anesthesia Transfer of Care Note  Patient: Melissa Howe  Procedure(s) Performed: LEFT ARM FIRST STAGE BASILIC VEIN  ARTERIOVENOUS (AV) FISTULA CREATION (Left Arm Upper)  Patient Location: PACU  Anesthesia Type:MAC and Regional  Level of Consciousness: awake, alert  and oriented  Airway & Oxygen Therapy: Patient Spontanous Breathing  Post-op Assessment: Report given to RN and Post -op Vital signs reviewed and stable  Post vital signs: Reviewed and stable  Last Vitals:  Vitals Value Taken Time  BP    Temp    Pulse 80 02/27/21 0948  Resp 18 02/27/21 0948  SpO2 97 % 02/27/21 0948  Vitals shown include unvalidated device data.  Last Pain:  Vitals:   02/27/21 0700  TempSrc: Oral  PainSc: 0-No pain      Patients Stated Pain Goal: 3 (26/41/58 3094)  Complications: No complications documented.

## 2021-02-27 NOTE — Anesthesia Procedure Notes (Signed)
Anesthesia Regional Block: Supraclavicular block   Pre-Anesthetic Checklist: ,, timeout performed, Correct Patient, Correct Site, Correct Laterality, Correct Procedure, Correct Position, site marked, Risks and benefits discussed,  Surgical consent,  Pre-op evaluation,  At surgeon's request and post-op pain management  Laterality: Left and Upper  Prep: chloraprep       Needles:  Injection technique: Single-shot     Needle Length: 9cm  Needle Gauge: 21     Additional Needles:   Procedures:,,,, ultrasound used (permanent image in chart),,,,  Narrative:  Start time: 02/27/2021 8:08 AM End time: 02/27/2021 8:14 AM Injection made incrementally with aspirations every 5 mL.  Performed by: Personally  Anesthesiologist: Annye Asa, MD  Additional Notes: Pt identified in Holding room.  Monitors applied. Working IV access confirmed. Sterile prep L clavicle and neck.  #21ga ECHOgenic Arrow block needle to supraclav brachial plexus with US guidance.  25cc 1.5% Lidocaine with 1:200k epi injected incrementally after negative test dose.  Patient asymptomatic, VSS, no heme aspirated, tolerated well.  Jenita Seashore, MD

## 2021-02-27 NOTE — Anesthesia Procedure Notes (Signed)
Procedure Name: MAC Date/Time: 02/27/2021 8:36 AM Performed by: Babs Bertin, CRNA Pre-anesthesia Checklist: Patient identified, Emergency Drugs available, Suction available, Patient being monitored and Timeout performed Patient Re-evaluated:Patient Re-evaluated prior to induction Oxygen Delivery Method: Simple face mask

## 2021-02-28 ENCOUNTER — Encounter (HOSPITAL_COMMUNITY): Payer: Self-pay | Admitting: Vascular Surgery

## 2021-03-06 ENCOUNTER — Other Ambulatory Visit: Payer: Self-pay

## 2021-03-06 DIAGNOSIS — N184 Chronic kidney disease, stage 4 (severe): Secondary | ICD-10-CM

## 2021-03-11 ENCOUNTER — Encounter (HOSPITAL_COMMUNITY): Payer: Self-pay

## 2021-03-11 ENCOUNTER — Other Ambulatory Visit: Payer: Self-pay

## 2021-03-11 ENCOUNTER — Emergency Department (HOSPITAL_COMMUNITY)
Admission: EM | Admit: 2021-03-11 | Discharge: 2021-03-11 | Disposition: A | Discharge status: Home / Self Care | Payer: Medicare Other | Attending: Emergency Medicine | Admitting: Emergency Medicine

## 2021-03-11 DIAGNOSIS — E876 Hypokalemia: Secondary | ICD-10-CM | POA: Diagnosis not present

## 2021-03-11 DIAGNOSIS — I5032 Chronic diastolic (congestive) heart failure: Secondary | ICD-10-CM | POA: Insufficient documentation

## 2021-03-11 DIAGNOSIS — N184 Chronic kidney disease, stage 4 (severe): Secondary | ICD-10-CM | POA: Insufficient documentation

## 2021-03-11 DIAGNOSIS — Z79899 Other long term (current) drug therapy: Secondary | ICD-10-CM | POA: Diagnosis not present

## 2021-03-11 DIAGNOSIS — E875 Hyperkalemia: Secondary | ICD-10-CM

## 2021-03-11 DIAGNOSIS — Z794 Long term (current) use of insulin: Secondary | ICD-10-CM | POA: Diagnosis not present

## 2021-03-11 DIAGNOSIS — R220 Localized swelling, mass and lump, head: Secondary | ICD-10-CM | POA: Diagnosis present

## 2021-03-11 DIAGNOSIS — Z87891 Personal history of nicotine dependence: Secondary | ICD-10-CM | POA: Insufficient documentation

## 2021-03-11 DIAGNOSIS — I130 Hypertensive heart and chronic kidney disease with heart failure and stage 1 through stage 4 chronic kidney disease, or unspecified chronic kidney disease: Secondary | ICD-10-CM | POA: Insufficient documentation

## 2021-03-11 DIAGNOSIS — Z992 Dependence on renal dialysis: Secondary | ICD-10-CM | POA: Insufficient documentation

## 2021-03-11 DIAGNOSIS — B86 Scabies: Secondary | ICD-10-CM | POA: Insufficient documentation

## 2021-03-11 DIAGNOSIS — E1122 Type 2 diabetes mellitus with diabetic chronic kidney disease: Secondary | ICD-10-CM | POA: Diagnosis not present

## 2021-03-11 LAB — CBC WITH DIFFERENTIAL/PLATELET
Abs Immature Granulocytes: 0.04 10*3/uL (ref 0.00–0.07)
Basophils Absolute: 0 10*3/uL (ref 0.0–0.1)
Basophils Relative: 1 %
Eosinophils Absolute: 0.3 10*3/uL (ref 0.0–0.5)
Eosinophils Relative: 3 %
HCT: 38.3 % (ref 36.0–46.0)
Hemoglobin: 12 g/dL (ref 12.0–15.0)
Immature Granulocytes: 1 %
Lymphocytes Relative: 33 %
Lymphs Abs: 2.6 10*3/uL (ref 0.7–4.0)
MCH: 28.9 pg (ref 26.0–34.0)
MCHC: 31.3 g/dL (ref 30.0–36.0)
MCV: 92.3 fL (ref 80.0–100.0)
Monocytes Absolute: 0.7 10*3/uL (ref 0.1–1.0)
Monocytes Relative: 9 %
Neutro Abs: 4.4 10*3/uL (ref 1.7–7.7)
Neutrophils Relative %: 53 %
Platelets: 306 10*3/uL (ref 150–400)
RBC: 4.15 MIL/uL (ref 3.87–5.11)
RDW: 15.2 % (ref 11.5–15.5)
WBC: 8 10*3/uL (ref 4.0–10.5)
nRBC: 0 % (ref 0.0–0.2)

## 2021-03-11 LAB — BASIC METABOLIC PANEL
Anion gap: 10 (ref 5–15)
BUN: 69 mg/dL — ABNORMAL HIGH (ref 8–23)
CO2: 26 mmol/L (ref 22–32)
Calcium: 8.9 mg/dL (ref 8.9–10.3)
Chloride: 101 mmol/L (ref 98–111)
Creatinine, Ser: 7.55 mg/dL — ABNORMAL HIGH (ref 0.44–1.00)
GFR, Estimated: 5 mL/min — ABNORMAL LOW (ref >60)
Glucose, Bld: 184 mg/dL — ABNORMAL HIGH (ref 70–99)
Potassium: 5.4 mmol/L — ABNORMAL HIGH (ref 3.5–5.1)
Sodium: 137 mmol/L (ref 135–145)

## 2021-03-11 MED ORDER — SODIUM ZIRCONIUM CYCLOSILICATE 10 G PO PACK
10.0000 g | PACK | Freq: Once | ORAL | Status: AC
  Administered 2021-03-11: 10 g via ORAL
  Filled 2021-03-11: qty 1
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
## 2021-03-11 NOTE — Discharge Instructions (Addendum)
It is important to attend dialysis tomorrow as scheduled. Return to the emergency room with any new, worsening, concerning symptoms.

## 2021-03-11 NOTE — ED Provider Notes (Addendum)
Patient signed out to me by L. Percell Miller, PA-C.  Please see previous notes for further history.  In brief, patient with a history of dialysis, goes Tuesday, Thursday, Saturday.  Did not go on Saturday due to neuropathy.  Today, her only complaint is that her face feels slightly swollen.  She is not in any respiratory distress, no shortness of breath.  Potassium is minimally elevated 5.4, EKG is normal.  Labs otherwise reassuring.  Sats are stable.  Pending consultation with nephrology regarding plan for dialysis.  Discussed with Dr. Jonnie Finner from nephrology who agrees to plan of 10 mg of Lokelma and discharge with patient going to her normal dialysis tomorrow morning.  He does not feel patient needs dialysis emergently tonight.   On my evaluation, patient is resting comfortably in the bed.  No respiratory distress.  Sats are stable.  Discussed plan with patient, she is agreeable.  Stressed importance of going to dialysis tomorrow as scheduled.  At this time, patient appears safe for discharge.  Return precautions given.  Patient states she understands and agrees to plan.   Franchot Heidelberg, PA-C 03/11/21 1620    Lorelle Gibbs, DO 03/11/21 1621

## 2021-03-11 NOTE — ED Triage Notes (Signed)
Patient reports facial swelling starting yesterday and feels like it is worse today, reports she had sx on 5/4 and was prescribed hydrocodone for pain, states she took 2 days worth and her daughter noticed her face was swollen, states she stopped taking the medication and the swelling went away, patient took another dose 2 days ago and noticed the swelling started again. Patient also reports she has scabies in her house.

## 2021-04-05 ENCOUNTER — Other Ambulatory Visit: Payer: Self-pay

## 2021-04-05 ENCOUNTER — Ambulatory Visit (INDEPENDENT_AMBULATORY_CARE_PROVIDER_SITE_OTHER): Payer: Medicare Other | Admitting: Physician Assistant

## 2021-04-05 ENCOUNTER — Ambulatory Visit (HOSPITAL_COMMUNITY)
Admission: RE | Admit: 2021-04-05 | Discharge: 2021-04-05 | Disposition: A | Discharge status: Home / Self Care | Payer: Medicare Other | Source: Ambulatory Visit | Attending: Vascular Surgery | Admitting: Vascular Surgery

## 2021-04-05 VITALS — BP 163/78 | HR 85 | Temp 97.8°F | Resp 20 | Ht 64.0 in | Wt 166.0 lb

## 2021-04-05 DIAGNOSIS — N184 Chronic kidney disease, stage 4 (severe): Secondary | ICD-10-CM | POA: Insufficient documentation

## 2021-04-05 DIAGNOSIS — N186 End stage renal disease: Secondary | ICD-10-CM

## 2021-04-05 NOTE — Progress Notes (Signed)
Postoperative Access Visit   History of Present Illness   Lorijean Claes is a 70 y.o. year old female who presents for postoperative follow-up for:  left 1st stage basilic vein AV fistula by Dr. Donzetta Matters on 02/27/21. The patient's wounds are well healed.  The patient notes no steal symptoms.  The patient is able to complete their activities of daily living.  The patient's current symptoms are: numbness in left forearm. She explains that she has neuropathy in her hands and she has had numbness in her finger tips for a long time but the forearm numbness has only been present since surgery. It is not painful but she notices that her forearm feels numb to the touch. Otherwise no weakness or coldness.  She currently dialyzes via a catheter at the [facility] location on [facility]. She has had a previous left forearm graft which thrombosed  Physical Examination   Vitals:   04/05/21 1100  BP: (!) 163/78  Pulse: 85  Resp: 20  Temp: 97.8 F (36.6 C)  TempSrc: Temporal  SpO2: 100%  Weight: 166 lb (75.3 kg)  Height: '5\' 4"'$  (1.626 m)   Body mass index is 28.49 kg/m.  left arm Incision is well healed, radial pulse not palpable, hand grip is 5/5, sensation in digits is intact, palpable thrill, bruit can  be auscultated. Fistula is deep in left upper arm. She has old thrombosed left forearm graft   Non invasive vascular lab: Findings:  +--------------------+----------+-----------------+--------+  AVF                 PSV (cm/s)Flow Vol (mL/min)Comments  +--------------------+----------+-----------------+--------+  Native artery inflow   205          1070                 +--------------------+----------+-----------------+--------+  AVF Anastomosis        371                      bruit    +--------------------+----------+-----------------+--------+      +------------+----------+-------------+----------+-------------------------  ----+  OUTFLOW VEINPSV (cm/s)Diameter (cm)Depth  (cm)          Describe              +------------+----------+-------------+----------+-------------------------  ----+  Shoulder       157        0.74        1.57                                   +------------+----------+-------------+----------+-------------------------  ----+  Prox UA        154        0.63        2.03                                   +------------+----------+-------------+----------+-------------------------  ----+  Mid UA         171        0.66        1.54   competing branch 0.18cm,  212  cm/s                +------------+----------+-------------+----------+-------------------------  ----+  Dist UA        775        0.20        1.10    change in Diameter,  0.63 cm                                                          in length              +------------+----------+-------------+----------+-------------------------  ----+     Medical Decision Making   Arynne Duverge Lacek is a 70 y.o. year old female who presents s/p left 1st stage basilic vein AV fistula by Dr. Donzetta Matters on 02/27/21. Her incision is well healed. She is having numbness in left forearm but no other steal symptoms. Presently this is tolerable and I explained to her that this may resolve with time. Her duplex today shows that the fistula has matured nicely but it does have competing branch and is also too deep in the left upper arm.  A second stage transposition was previously discussed with her and I again discussed risks and benefits and she is agreeable to proceed. I will arrange this with Dr. Donzetta Matters in the near future. She currently dialyzes on Tues/ Thurs/Sat so will try to arrange this on a non dialysis day  Karoline Caldwell, PA-C [facility]: [phone]  On call MD: Dr. Stanford Breed

## 2021-04-05 NOTE — H&P (View-Only) (Signed)
Postoperative Access Visit   History of Present Illness   Melissa Howe is a 71 y.o. year old female who presents for postoperative follow-up for:  left 1st stage basilic vein AV fistula by Dr. Donzetta Matters on 02/27/21. The patient's wounds are well healed.  The patient notes no steal symptoms.  The patient is able to complete their activities of daily living.  The patient's current symptoms are: numbness in left forearm. She explains that she has neuropathy in her hands and she has had numbness in her finger tips for a long time but the forearm numbness has only been present since surgery. It is not painful but she notices that her forearm feels numb to the touch. Otherwise no weakness or coldness.  She currently dialyzes via a catheter at the [facility] location on [facility]. She has had a previous left forearm graft which thrombosed  Physical Examination   Vitals:   04/05/21 1100  BP: (!) 163/78  Pulse: 85  Resp: 20  Temp: 97.8 F (36.6 C)  TempSrc: Temporal  SpO2: 100%  Weight: 166 lb (75.3 kg)  Height: '5\' 4"'$  (1.626 m)   Body mass index is 28.49 kg/m.  left arm Incision is well healed, radial pulse not palpable, hand grip is 5/5, sensation in digits is intact, palpable thrill, bruit can  be auscultated. Fistula is deep in left upper arm. She has old thrombosed left forearm graft   Non invasive vascular lab: Findings:  +--------------------+----------+-----------------+--------+  AVF                 PSV (cm/s)Flow Vol (mL/min)Comments  +--------------------+----------+-----------------+--------+  Native artery inflow   205          1070                 +--------------------+----------+-----------------+--------+  AVF Anastomosis        371                      bruit    +--------------------+----------+-----------------+--------+      +------------+----------+-------------+----------+-------------------------  ----+  OUTFLOW VEINPSV (cm/s)Diameter (cm)Depth  (cm)          Describe              +------------+----------+-------------+----------+-------------------------  ----+  Shoulder       157        0.74        1.57                                   +------------+----------+-------------+----------+-------------------------  ----+  Prox UA        154        0.63        2.03                                   +------------+----------+-------------+----------+-------------------------  ----+  Mid UA         171        0.66        1.54   competing branch 0.18cm,  212  cm/s                +------------+----------+-------------+----------+-------------------------  ----+  Dist UA        775        0.20        1.10    change in Diameter,  0.63 cm                                                          in length              +------------+----------+-------------+----------+-------------------------  ----+     Medical Decision Making   Melissa Howe is a 70 y.o. year old female who presents s/p left 1st stage basilic vein AV fistula by Dr. Donzetta Matters on 02/27/21. Her incision is well healed. She is having numbness in left forearm but no other steal symptoms. Presently this is tolerable and I explained to her that this may resolve with time. Her duplex today shows that the fistula has matured nicely but it does have competing branch and is also too deep in the left upper arm.  A second stage transposition was previously discussed with her and I again discussed risks and benefits and she is agreeable to proceed. I will arrange this with Dr. Donzetta Matters in the near future. She currently dialyzes on Tues/ Thurs/Sat so will try to arrange this on a non dialysis day  Melissa Caldwell, PA-C [facility]: [phone]  On call MD: Dr. Stanford Breed

## 2021-04-15 ENCOUNTER — Other Ambulatory Visit: Payer: Self-pay

## 2021-04-15 ENCOUNTER — Encounter (HOSPITAL_COMMUNITY): Payer: Self-pay | Admitting: Vascular Surgery

## 2021-04-15 NOTE — Progress Notes (Addendum)
Melissa Howe denies chest pain, patient reports that she only gets short of breath if she has drank too much fluid.  Melissa Howe has type II diabetes, patien treports that CBG's run 180- 220. I instructed patient to take 15 units of Levemir Insulin at hs, if CBG is greater than 70 on Wednesday am- take 9 units of Levemir Insulin. I instructed patient to check CBG after awaking and every 2 hours until arrival  to the hospital.  I Instructed patient if CBG is less than 70 to drink1 /2 cup of a clear juice. Recheck CBG in 15 minutes if CBG is not over 70 call, pre- op desk at [phone] for further instructions. If scheduled to receive Insulin, do not take Insulin .

## 2021-04-17 ENCOUNTER — Other Ambulatory Visit: Payer: Self-pay

## 2021-04-17 ENCOUNTER — Ambulatory Visit (HOSPITAL_COMMUNITY): Payer: Medicare Other | Admitting: Anesthesiology

## 2021-04-17 ENCOUNTER — Encounter (HOSPITAL_COMMUNITY): Payer: Self-pay | Admitting: Vascular Surgery

## 2021-04-17 ENCOUNTER — Encounter (HOSPITAL_COMMUNITY)
Admission: RE | Disposition: A | Discharge status: Home / Self Care | Payer: Self-pay | Source: Home / Self Care | Attending: Vascular Surgery

## 2021-04-17 ENCOUNTER — Ambulatory Visit (HOSPITAL_COMMUNITY)
Admission: RE | Admit: 2021-04-17 | Discharge: 2021-04-17 | Disposition: A | Discharge status: Home / Self Care | Payer: Medicare Other | Attending: Vascular Surgery | Admitting: Vascular Surgery

## 2021-04-17 DIAGNOSIS — N186 End stage renal disease: Secondary | ICD-10-CM | POA: Insufficient documentation

## 2021-04-17 DIAGNOSIS — E1122 Type 2 diabetes mellitus with diabetic chronic kidney disease: Secondary | ICD-10-CM | POA: Diagnosis not present

## 2021-04-17 DIAGNOSIS — I509 Heart failure, unspecified: Secondary | ICD-10-CM | POA: Insufficient documentation

## 2021-04-17 DIAGNOSIS — E1142 Type 2 diabetes mellitus with diabetic polyneuropathy: Secondary | ICD-10-CM | POA: Diagnosis not present

## 2021-04-17 DIAGNOSIS — Z992 Dependence on renal dialysis: Secondary | ICD-10-CM | POA: Diagnosis not present

## 2021-04-17 DIAGNOSIS — I132 Hypertensive heart and chronic kidney disease with heart failure and with stage 5 chronic kidney disease, or end stage renal disease: Secondary | ICD-10-CM | POA: Diagnosis not present

## 2021-04-17 DIAGNOSIS — E1151 Type 2 diabetes mellitus with diabetic peripheral angiopathy without gangrene: Secondary | ICD-10-CM | POA: Insufficient documentation

## 2021-04-17 HISTORY — DX: Personal history of other medical treatment: Z92.89

## 2021-04-17 HISTORY — DX: Anemia, unspecified: D64.9

## 2021-04-17 HISTORY — PX: BASCILIC VEIN TRANSPOSITION: SHX5742

## 2021-04-17 LAB — POCT I-STAT, CHEM 8
BUN: 41 mg/dL — ABNORMAL HIGH (ref 8–23)
Calcium, Ion: 0.87 mmol/L — CL (ref 1.15–1.40)
Chloride: 97 mmol/L — ABNORMAL LOW (ref 98–111)
Creatinine, Ser: 5.7 mg/dL — ABNORMAL HIGH (ref 0.44–1.00)
Glucose, Bld: 105 mg/dL — ABNORMAL HIGH (ref 70–99)
HCT: 41 % (ref 36.0–46.0)
Hemoglobin: 13.9 g/dL (ref 12.0–15.0)
Potassium: 4.9 mmol/L (ref 3.5–5.1)
Sodium: 134 mmol/L — ABNORMAL LOW (ref 135–145)
TCO2: 31 mmol/L (ref 22–32)

## 2021-04-17 LAB — GLUCOSE, CAPILLARY
Glucose-Capillary: 109 mg/dL — ABNORMAL HIGH (ref 70–99)
Glucose-Capillary: 143 mg/dL — ABNORMAL HIGH (ref 70–99)

## 2021-04-17 SURGERY — TRANSPOSITION, VEIN, BASILIC
Anesthesia: Monitor Anesthesia Care | Laterality: Left

## 2021-04-17 MED ORDER — PROPOFOL 500 MG/50ML IV EMUL
INTRAVENOUS | Status: DC | PRN
  Administered 2021-04-17: 65 ug/kg/min via INTRAVENOUS

## 2021-04-17 MED ORDER — SODIUM CHLORIDE 0.9 % IV SOLN: INTRAVENOUS | Status: DC

## 2021-04-17 MED ORDER — FENTANYL CITRATE (PF) 250 MCG/5ML IJ SOLN
INTRAMUSCULAR | Status: AC
  Filled 2021-04-17: qty 5

## 2021-04-17 MED ORDER — ONDANSETRON HCL 4 MG/2ML IJ SOLN: 4.0000 mg | Freq: Four times a day (QID) | INTRAMUSCULAR | Status: DC | PRN

## 2021-04-17 MED ORDER — CHLORHEXIDINE GLUCONATE 0.12 % MT SOLN
15.0000 mL | Freq: Once | OROMUCOSAL | Status: AC
  Administered 2021-04-17: 15 mL via OROMUCOSAL
  Filled 2021-04-17: qty 15

## 2021-04-17 MED ORDER — HEMOSTATIC AGENTS (NO CHARGE) OPTIME
TOPICAL | Status: DC | PRN
  Administered 2021-04-17: 1 via TOPICAL

## 2021-04-17 MED ORDER — OXYCODONE HCL 5 MG PO TABS: 5.0000 mg | ORAL_TABLET | Freq: Once | ORAL | Status: DC | PRN

## 2021-04-17 MED ORDER — HYDROCODONE-ACETAMINOPHEN 5-325 MG PO TABS: 1.0000 | ORAL_TABLET | ORAL | 0 refills | Status: DC | PRN

## 2021-04-17 MED ORDER — FENTANYL CITRATE (PF) 100 MCG/2ML IJ SOLN
INTRAMUSCULAR | Status: DC | PRN
  Administered 2021-04-17 (×2): 50 ug via INTRAVENOUS

## 2021-04-17 MED ORDER — SODIUM CHLORIDE 0.9 % IV SOLN
INTRAVENOUS | Status: AC
  Filled 2021-04-17: qty 1.2

## 2021-04-17 MED ORDER — PROPOFOL 10 MG/ML IV BOLUS
INTRAVENOUS | Status: AC
  Filled 2021-04-17: qty 20

## 2021-04-17 MED ORDER — HEPARIN SODIUM (PORCINE) 1000 UNIT/ML IJ SOLN
2100.0000 [IU] | Freq: Once | INTRAMUSCULAR | Status: AC
  Administered 2021-04-17: 2100 [IU] via INTRAVENOUS
  Filled 2021-04-17: qty 2.1

## 2021-04-17 MED ORDER — LIDOCAINE-EPINEPHRINE 1 %-1:100000 IJ SOLN
INTRAMUSCULAR | Status: AC
  Filled 2021-04-17: qty 1

## 2021-04-17 MED ORDER — CEFAZOLIN SODIUM-DEXTROSE 2-4 GM/100ML-% IV SOLN
2.0000 g | INTRAVENOUS | Status: AC
  Administered 2021-04-17: 2 g via INTRAVENOUS
  Filled 2021-04-17: qty 100

## 2021-04-17 MED ORDER — ONDANSETRON HCL 4 MG/2ML IJ SOLN
INTRAMUSCULAR | Status: DC | PRN
  Administered 2021-04-17: 4 mg via INTRAVENOUS

## 2021-04-17 MED ORDER — LIDOCAINE HCL (PF) 1 % IJ SOLN
INTRAMUSCULAR | Status: AC
  Filled 2021-04-17: qty 30

## 2021-04-17 MED ORDER — PHENYLEPHRINE HCL-NACL 10-0.9 MG/250ML-% IV SOLN
INTRAVENOUS | Status: DC | PRN
  Administered 2021-04-17: 25 ug/min via INTRAVENOUS

## 2021-04-17 MED ORDER — 0.9 % SODIUM CHLORIDE (POUR BTL) OPTIME
TOPICAL | Status: DC | PRN
  Administered 2021-04-17: 1000 mL

## 2021-04-17 MED ORDER — PROPOFOL 10 MG/ML IV BOLUS
INTRAVENOUS | Status: DC | PRN
  Administered 2021-04-17: 20 mg via INTRAVENOUS

## 2021-04-17 MED ORDER — SODIUM CHLORIDE 0.9 % IV SOLN: INTRAVENOUS | Status: DC | PRN

## 2021-04-17 MED ORDER — LIDOCAINE-EPINEPHRINE (PF) 1.5 %-1:200000 IJ SOLN
INTRAMUSCULAR | Status: DC | PRN
  Administered 2021-04-17: 25 mL via PERINEURAL

## 2021-04-17 MED ORDER — FENTANYL CITRATE (PF) 100 MCG/2ML IJ SOLN: 25.0000 ug | INTRAMUSCULAR | Status: DC | PRN

## 2021-04-17 MED ORDER — CHLORHEXIDINE GLUCONATE 4 % EX LIQD: 60.0000 mL | Freq: Once | CUTANEOUS | Status: DC

## 2021-04-17 MED ORDER — ORAL CARE MOUTH RINSE: 15.0000 mL | Freq: Once | OROMUCOSAL | Status: AC

## 2021-04-17 MED ORDER — OXYCODONE HCL 5 MG/5ML PO SOLN: 5.0000 mg | Freq: Once | ORAL | Status: DC | PRN

## 2021-04-17 MED ORDER — ONDANSETRON HCL 4 MG/2ML IJ SOLN
INTRAMUSCULAR | Status: AC
  Filled 2021-04-17: qty 2

## 2021-04-17 SURGICAL SUPPLY — 33 items
ARMBAND PINK RESTRICT EXTREMIT (MISCELLANEOUS) ×3 IMPLANT
CANISTER SUCT 3000ML PPV (MISCELLANEOUS) ×3 IMPLANT
CLIP LIGATING EXTRA MED SLVR (CLIP) ×3 IMPLANT
CLIP LIGATING EXTRA SM BLUE (MISCELLANEOUS) ×3 IMPLANT
CLIP VESOCCLUDE MED 6/CT (CLIP) IMPLANT
CLIP VESOCCLUDE SM WIDE 24/CT (CLIP) IMPLANT
CLIP VESOCCLUDE SM WIDE 6/CT (CLIP) IMPLANT
COVER PROBE W GEL 5X96 (DRAPES) ×3 IMPLANT
COVER WAND RF STERILE (DRAPES) ×3 IMPLANT
DERMABOND ADVANCED (GAUZE/BANDAGES/DRESSINGS) ×2
DERMABOND ADVANCED .7 DNX12 (GAUZE/BANDAGES/DRESSINGS) ×1 IMPLANT
ELECT REM PT RETURN 9FT ADLT (ELECTROSURGICAL) ×3
ELECTRODE REM PT RTRN 9FT ADLT (ELECTROSURGICAL) ×1 IMPLANT
GLOVE SURG ENC MOIS LTX SZ7.5 (GLOVE) ×3 IMPLANT
GOWN STRL REUS W/ TWL LRG LVL3 (GOWN DISPOSABLE) ×2 IMPLANT
GOWN STRL REUS W/ TWL XL LVL3 (GOWN DISPOSABLE) ×1 IMPLANT
GOWN STRL REUS W/TWL LRG LVL3 (GOWN DISPOSABLE) ×6
GOWN STRL REUS W/TWL XL LVL3 (GOWN DISPOSABLE) ×3
HEMOSTAT SNOW SURGICEL 2X4 (HEMOSTASIS) ×3 IMPLANT
KIT BASIN OR (CUSTOM PROCEDURE TRAY) ×3 IMPLANT
KIT TURNOVER KIT B (KITS) ×3 IMPLANT
NS IRRIG 1000ML POUR BTL (IV SOLUTION) ×3 IMPLANT
PACK CV ACCESS (CUSTOM PROCEDURE TRAY) ×3 IMPLANT
PAD ARMBOARD 7.5X6 YLW CONV (MISCELLANEOUS) ×6 IMPLANT
SLING ARM IMMOBILIZER LRG (SOFTGOODS) ×3 IMPLANT
SUT MNCRL AB 4-0 PS2 18 (SUTURE) ×6 IMPLANT
SUT PROLENE 6 0 BV (SUTURE) ×3 IMPLANT
SUT SILK 2 0 SH (SUTURE) ×3 IMPLANT
SUT VIC AB 3-0 SH 27 (SUTURE) ×6
SUT VIC AB 3-0 SH 27X BRD (SUTURE) ×2 IMPLANT
TOWEL GREEN STERILE (TOWEL DISPOSABLE) ×3 IMPLANT
UNDERPAD 30X36 HEAVY ABSORB (UNDERPADS AND DIAPERS) ×3 IMPLANT
WATER STERILE IRR 1000ML POUR (IV SOLUTION) ×3 IMPLANT

## 2021-04-17 NOTE — Discharge Instructions (Signed)
° °  [facility] [place] ° °Discharge Instructions ° °AV Fistula or Graft Surgery for Dialysis Access ° °Please refer to the following instructions for your post-procedure care. Your surgeon or physician assistant will discuss any changes with you. ° °Activity ° °You may drive the day following your surgery, if you are comfortable and no longer taking prescription pain medication. Resume full activity as the soreness in your incision resolves. ° °Bathing/Showering ° °You may shower after you go home. Keep your incision dry for 48 hours. Do not soak in a bathtub, hot tub, or swim until the incision heals completely. You may not shower if you have a hemodialysis catheter. ° °Incision Care ° °Clean your incision with mild soap and water after 48 hours. Pat the area dry with a clean towel. You do not need a bandage unless otherwise instructed. Do not apply any ointments or creams to your incision. You may have skin glue on your incision. Do not peel it off. It will come off on its own in about one week. Your arm may swell a bit after surgery. To reduce swelling use pillows to elevate your arm so it is above your heart. Your doctor will tell you if you need to lightly wrap your arm with an ACE bandage. ° °Diet ° °Resume your normal diet. There are not special food restrictions following this procedure. In order to heal from your surgery, it is CRITICAL to get adequate nutrition. Your body requires vitamins, minerals, and protein. Vegetables are the best source of vitamins and minerals. Vegetables also provide the perfect balance of protein. Processed food has little nutritional value, so try to avoid this. ° °Medications ° °Resume taking all of your medications. If your incision is causing pain, you may take over-the counter pain relievers such as acetaminophen (Tylenol). If you were prescribed a stronger pain medication, please be aware these medications can cause nausea and constipation. Prevent  nausea by taking the medication with a snack or meal. Avoid constipation by drinking plenty of fluids and eating foods with high amount of fiber, such as fruits, vegetables, and grains. Do not take Tylenol if you are taking prescription pain medications. ° ° ° ° °Follow up °Your surgeon may want to see you in the office following your access surgery. If so, this will be arranged at the time of your surgery. ° °Please call us immediately for any of the following conditions: ° °Increased pain, redness, drainage (pus) from your incision site °Fever of 101 degrees or higher °Severe or worsening pain at your incision site °Hand pain or numbness. ° °Reduce your risk of vascular disease: ° °Stop smoking. If you would like help, call [facility] at 1-800-QUIT-NOW (1-800-784-8669) or [facility] at 336-586-4000 ° °Manage your cholesterol °Maintain a desired weight °Control your diabetes °Keep your blood pressure down ° °Dialysis ° °It will take several weeks to several months for your new dialysis access to be ready for use. Your surgeon will determine when it is OK to use it. Your nephrologist will continue to direct your dialysis. You can continue to use your Permcath until your new access is ready for use. ° °If you have any questions, please call the office at [phone]. ° °

## 2021-04-17 NOTE — Anesthesia Preprocedure Evaluation (Signed)
Anesthesia Evaluation  Patient identified by MRN, date of birth, ID band Patient awake    Reviewed: Allergy & Precautions, H&P , NPO status , Patient's Chart, lab work & pertinent test results  Airway Mallampati: II   Neck ROM: full    Dental   Pulmonary shortness of breath, former smoker,    breath sounds clear to auscultation       Cardiovascular hypertension, + Peripheral Vascular Disease and +CHF   Rhythm:regular Rate:Normal     Neuro/Psych  Headaches, PSYCHIATRIC DISORDERS Depression    GI/Hepatic PUD,   Endo/Other  diabetes  Renal/GU ESRFRenal disease     Musculoskeletal   Abdominal   Peds  Hematology   Anesthesia Other Findings   Reproductive/Obstetrics                             Anesthesia Physical Anesthesia Plan  ASA: 3  Anesthesia Plan: MAC and Regional   Post-op Pain Management:    Induction: Intravenous  PONV Risk Score and Plan: 2 and Ondansetron, Dexamethasone, Midazolam and Treatment may vary due to age or medical condition  Airway Management Planned: Simple Face Mask  Additional Equipment:   Intra-op Plan:   Post-operative Plan:   Informed Consent: I have reviewed the patients History and Physical, chart, labs and discussed the procedure including the risks, benefits and alternatives for the proposed anesthesia with the patient or authorized representative who has indicated his/her understanding and acceptance.     Dental advisory given  Plan Discussed with: CRNA, Anesthesiologist and Surgeon  Anesthesia Plan Comments:         Anesthesia Quick Evaluation

## 2021-04-17 NOTE — Interval H&P Note (Signed)
History and Physical Interval Note:  04/17/2021 7:21 AM  Melissa Howe  has presented today for surgery, with the diagnosis of ESRD.  The various methods of treatment have been discussed with the patient and family. After consideration of risks, benefits and other options for treatment, the patient has consented to  Procedure(s): LEFT SECOND STAGE [place] (Left) as a surgical intervention.  The patient's history has been reviewed, patient examined, no change in status, stable for surgery.  I have reviewed the patient's chart and labs.  Questions were answered to the patient's satisfaction.     Servando Snare

## 2021-04-17 NOTE — Anesthesia Procedure Notes (Signed)
Procedure Name: MAC Date/Time: 04/17/2021 7:45 AM Performed by: Renato Shin, CRNA Pre-anesthesia Checklist: Patient identified, Emergency Drugs available, Suction available and Patient being monitored Patient Re-evaluated:Patient Re-evaluated prior to induction Oxygen Delivery Method: Nasal cannula Induction Type: IV induction Placement Confirmation: positive ETCO2 and breath sounds checked- equal and bilateral Dental Injury: Teeth and Oropharynx as per pre-operative assessment

## 2021-04-17 NOTE — Op Note (Signed)
    Patient name: Melissa Howe MRN: [id number] DOB: [birth year] Sex: female  04/17/2021 Pre-operative Diagnosis: esrd Post-operative diagnosis:  Same Surgeon:  Erlene Quan C. Donzetta Matters, MD Assistant: Risa Grill, PA Procedure Performed: Revision of left arm AV fistula with transposition of basilic vein   Indications: 70 year old female currently dialyzing via catheter.  She has previously had brachial artery to basilic vein fistula created which has matured nicely.  She is now indicated for the second stage.  Findings: There is dense scar tissue involving the nerve at the level just above the antecubitum.  The nerve was transected at this level.  She already did have some numbness of her medial forearm.  The fistula throughout its course measured at least 8 mm.  After tunneling laterally on the arm there was a very strong thrill in the fistula.  There is a strong signal in the radial artery at the wrist this did augment with compression of the fistula.   Procedure:  The patient was identified in the holding area and taken to the operating where she was put supine operating table.  MAC anesthesia was induced.  A preoperative block of been placed.  She was gently prepped and draped in left upper extremity usual fashion, antibiotics were ministered a timeout was called.  The block was tested.  We reopened her previous above the antecubitum incision.  We dissected down to the fistula.  The nerve was densely adherent this was divided between clips sharply.  We then made 2 skip incisions towards the axilla dividing the entirety of the fistula out to the level of the axillary vein.  Branches were divided between clips and ties.  We marked the fistula for orientation and then clamped it near the antecubitum.  Was transected tunneled laterally flushed with heparinized saline reclamped.  We spatulated both ends of them end-to-end with 6-0 Prolene suture.  Prior to completion without flushing all directions.  Upon  completion there was a very strong thrill in the fistula.  There was a radial artery signal at the wrist which was consistent with preoperative exam this did augment with compression of the fistula.  Satisfied with this we irrigated all the wounds we obtain hemostasis.  We closed in layers of Vicryl Monocryl.  Dermabond is placed to level the skin.  A sling was placed on her arm.  She was awakened from anesthesia having tolerated procedure without immediate complication.  All counts were correct at completion.   EBL: 50cc  Gianny Sabino C. Donzetta Matters, MD [facility] Office: [phone] Pager: [phone]

## 2021-04-17 NOTE — Transfer of Care (Signed)
Immediate Anesthesia Transfer of Care Note  Patient: Melissa Howe  Procedure(s) Performed: LEFT SECOND STAGE BASCILIC VEIN TRANSPOSITION (Left)  Patient Location: PACU  Anesthesia Type:MAC combined with regional for post-op pain  Level of Consciousness: awake and patient cooperative  Airway & Oxygen Therapy: Patient Spontanous Breathing and Patient connected to nasal cannula oxygen  Post-op Assessment: Report given to RN and Post -op Vital signs reviewed and stable  Post vital signs: Reviewed and stable  Last Vitals:  Vitals Value Taken Time  BP 112/63 04/17/21 0907  Temp    Pulse 76 04/17/21 0912  Resp 18 04/17/21 0912  SpO2 100 % 04/17/21 0912  Vitals shown include unvalidated device data.  Last Pain:  Vitals:   04/17/21 0613  TempSrc:   PainSc: 0-No pain         Complications: No notable events documented.

## 2021-04-17 NOTE — Anesthesia Procedure Notes (Signed)
Anesthesia Regional Block: Supraclavicular block   Pre-Anesthetic Checklist: , timeout performed,  Correct Patient, Correct Site, Correct Laterality,  Correct Procedure, Correct Position, site marked,  Risks and benefits discussed,  Surgical consent,  Pre-op evaluation,  At surgeon's request and post-op pain management  Laterality: Left  Prep: chloraprep       Needles:  Injection technique: Single-shot  Needle Type: Echogenic Stimulator Needle     Needle Length: 5cm  Needle Gauge: 22     Additional Needles:   Procedures:, nerve stimulator,,,,,     Nerve Stimulator or Paresthesia:  Response: biceps flexion, 0.45 mA  Additional Responses:   Narrative:  Start time: 04/17/2021 7:20 AM End time: 04/17/2021 7:30 AM Injection made incrementally with aspirations every 5 mL.  Performed by: Personally  Anesthesiologist: Albertha Ghee, MD  Additional Notes: Functioning IV was confirmed and monitors were applied.  A 65m 22ga Arrow echogenic stimulator needle was used. Sterile prep and drape,hand hygiene and sterile gloves were used.  Negative aspiration and negative test dose prior to incremental administration of local anesthetic. The patient tolerated the procedure well.  Ultrasound guidance: relevent anatomy identified, needle position confirmed, local anesthetic spread visualized around nerve(s), vascular puncture avoided.  Image printed for medical record.

## 2021-04-18 ENCOUNTER — Telehealth: Payer: Self-pay

## 2021-04-18 ENCOUNTER — Encounter (HOSPITAL_COMMUNITY): Payer: Self-pay | Admitting: Vascular Surgery

## 2021-04-18 NOTE — Anesthesia Postprocedure Evaluation (Signed)
Anesthesia Post Note  Patient: Melissa Howe  Procedure(s) Performed: LEFT SECOND STAGE BASCILIC VEIN TRANSPOSITION (Left)     Patient location during evaluation: PACU Anesthesia Type: Regional and MAC Level of consciousness: awake and alert Pain management: pain level controlled Vital Signs Assessment: post-procedure vital signs reviewed and stable Respiratory status: spontaneous breathing, nonlabored ventilation, respiratory function stable and patient connected to nasal cannula oxygen Cardiovascular status: stable and blood pressure returned to baseline Postop Assessment: no apparent nausea or vomiting Anesthetic complications: no   No notable events documented.  Last Vitals:  Vitals:   04/17/21 0938 04/17/21 0952  BP: 121/63 134/72  Pulse: 73 84  Resp: 16 15  Temp:  36.8 C  SpO2: 97% 99%    Last Pain:  Vitals:   04/17/21 0952  TempSrc:   PainSc: 0-No pain                 Leocadio Heal S

## 2021-04-18 NOTE — Telephone Encounter (Signed)
Patient needs (4 week) post op follow up on [place] day with PA s/p second stage BVT no studies. Called patient, UTR or leave VM. Mailed appt reminder letter.

## 2021-05-17 ENCOUNTER — Other Ambulatory Visit: Payer: Self-pay

## 2021-05-17 ENCOUNTER — Ambulatory Visit (INDEPENDENT_AMBULATORY_CARE_PROVIDER_SITE_OTHER): Payer: Medicare Other | Admitting: Physician Assistant

## 2021-05-17 VITALS — BP 146/76 | HR 87 | Temp 98.6°F | Resp 20 | Ht 64.0 in | Wt 167.1 lb

## 2021-05-17 DIAGNOSIS — N186 End stage renal disease: Secondary | ICD-10-CM

## 2021-05-17 NOTE — Progress Notes (Signed)
    Postoperative Access Visit   History of Present Illness   Melissa Howe is a 70 y.o. year old female who presents for postoperative follow-up for: left second stage basilic vein transposition (Date: 04/17/21).  The patient's wounds are healed.  The patient denies steal symptoms.  The patient is  able to complete their activities of daily living.  She is currently dialyzing via right IJ [facility] on a Tuesday Thursday Saturday schedule.   Physical Examination   Vitals:   05/17/21 1247  BP: (!) 146/76  Pulse: 87  Resp: 20  Temp: 98.6 F (37 C)  TempSrc: Temporal  SpO2: 100%  Weight: 167 lb 1.6 oz (75.8 kg)  Height: '5\' 4"'$  (1.626 m)   Body mass index is 28.68 kg/m.  left arm Incisions are healed, hand grip is 5/5, sensation in digits is intact, palpable thrill, bruit can be auscultated     Medical Decision Making   Melissa Howe is a 70 y.o. year old female who presents s/p left second stage basilic vein transposition  Patent basilic vein fistula without signs or symptoms of steal syndrome The patient's access will be ready for use 05/21/21 The patient's tunneled dialysis catheter can be removed when Nephrology is comfortable with the performance of the fistula The patient may follow up on a prn basis   Dagoberto Ligas PA-C [facility] Office: [phone]  Clinic MD: Donzetta Matters

## 2021-07-09 ENCOUNTER — Other Ambulatory Visit: Payer: Self-pay

## 2021-07-09 ENCOUNTER — Encounter (HOSPITAL_COMMUNITY): Payer: Self-pay

## 2021-07-09 ENCOUNTER — Inpatient Hospital Stay (HOSPITAL_COMMUNITY): Payer: Medicare Other

## 2021-07-09 ENCOUNTER — Inpatient Hospital Stay (HOSPITAL_COMMUNITY)
Admission: EM | Admit: 2021-07-09 | Discharge: 2021-07-13 | DRG: 246 | Disposition: A | Discharge status: Home / Self Care | Payer: Medicare Other | Attending: Internal Medicine | Admitting: Internal Medicine

## 2021-07-09 ENCOUNTER — Emergency Department (HOSPITAL_COMMUNITY): Payer: Medicare Other

## 2021-07-09 DIAGNOSIS — I953 Hypotension of hemodialysis: Secondary | ICD-10-CM | POA: Diagnosis not present

## 2021-07-09 DIAGNOSIS — I161 Hypertensive emergency: Secondary | ICD-10-CM | POA: Diagnosis present

## 2021-07-09 DIAGNOSIS — T1490XA Injury, unspecified, initial encounter: Secondary | ICD-10-CM

## 2021-07-09 DIAGNOSIS — I2510 Atherosclerotic heart disease of native coronary artery without angina pectoris: Secondary | ICD-10-CM | POA: Diagnosis present

## 2021-07-09 DIAGNOSIS — M7918 Myalgia, other site: Secondary | ICD-10-CM | POA: Diagnosis present

## 2021-07-09 DIAGNOSIS — E785 Hyperlipidemia, unspecified: Secondary | ICD-10-CM | POA: Diagnosis present

## 2021-07-09 DIAGNOSIS — I132 Hypertensive heart and chronic kidney disease with heart failure and with stage 5 chronic kidney disease, or end stage renal disease: Secondary | ICD-10-CM | POA: Diagnosis present

## 2021-07-09 DIAGNOSIS — Z955 Presence of coronary angioplasty implant and graft: Secondary | ICD-10-CM

## 2021-07-09 DIAGNOSIS — I5021 Acute systolic (congestive) heart failure: Secondary | ICD-10-CM | POA: Diagnosis present

## 2021-07-09 DIAGNOSIS — Z20822 Contact with and (suspected) exposure to covid-19: Secondary | ICD-10-CM | POA: Diagnosis present

## 2021-07-09 DIAGNOSIS — D631 Anemia in chronic kidney disease: Secondary | ICD-10-CM | POA: Diagnosis present

## 2021-07-09 DIAGNOSIS — E876 Hypokalemia: Secondary | ICD-10-CM | POA: Diagnosis not present

## 2021-07-09 DIAGNOSIS — Z992 Dependence on renal dialysis: Secondary | ICD-10-CM | POA: Diagnosis not present

## 2021-07-09 DIAGNOSIS — R0602 Shortness of breath: Secondary | ICD-10-CM

## 2021-07-09 DIAGNOSIS — J810 Acute pulmonary edema: Secondary | ICD-10-CM

## 2021-07-09 DIAGNOSIS — Z87891 Personal history of nicotine dependence: Secondary | ICD-10-CM | POA: Diagnosis not present

## 2021-07-09 DIAGNOSIS — Z794 Long term (current) use of insulin: Secondary | ICD-10-CM

## 2021-07-09 DIAGNOSIS — Z833 Family history of diabetes mellitus: Secondary | ICD-10-CM | POA: Diagnosis not present

## 2021-07-09 DIAGNOSIS — J9601 Acute respiratory failure with hypoxia: Secondary | ICD-10-CM

## 2021-07-09 DIAGNOSIS — D689 Coagulation defect, unspecified: Secondary | ICD-10-CM | POA: Diagnosis not present

## 2021-07-09 DIAGNOSIS — N2581 Secondary hyperparathyroidism of renal origin: Secondary | ICD-10-CM | POA: Diagnosis present

## 2021-07-09 DIAGNOSIS — E1122 Type 2 diabetes mellitus with diabetic chronic kidney disease: Secondary | ICD-10-CM | POA: Diagnosis present

## 2021-07-09 DIAGNOSIS — Z79899 Other long term (current) drug therapy: Secondary | ICD-10-CM | POA: Diagnosis not present

## 2021-07-09 DIAGNOSIS — Z8249 Family history of ischemic heart disease and other diseases of the circulatory system: Secondary | ICD-10-CM

## 2021-07-09 DIAGNOSIS — E1165 Type 2 diabetes mellitus with hyperglycemia: Secondary | ICD-10-CM | POA: Diagnosis present

## 2021-07-09 DIAGNOSIS — I169 Hypertensive crisis, unspecified: Secondary | ICD-10-CM | POA: Diagnosis not present

## 2021-07-09 DIAGNOSIS — N186 End stage renal disease: Secondary | ICD-10-CM | POA: Diagnosis present

## 2021-07-09 DIAGNOSIS — I255 Ischemic cardiomyopathy: Secondary | ICD-10-CM | POA: Diagnosis present

## 2021-07-09 DIAGNOSIS — E1140 Type 2 diabetes mellitus with diabetic neuropathy, unspecified: Secondary | ICD-10-CM | POA: Diagnosis present

## 2021-07-09 DIAGNOSIS — I214 Non-ST elevation (NSTEMI) myocardial infarction: Principal | ICD-10-CM | POA: Diagnosis present

## 2021-07-09 DIAGNOSIS — Z4659 Encounter for fitting and adjustment of other gastrointestinal appliance and device: Secondary | ICD-10-CM

## 2021-07-09 LAB — I-STAT ARTERIAL BLOOD GAS, ED
Acid-Base Excess: 1 mmol/L (ref 0.0–2.0)
Bicarbonate: 25.4 mmol/L (ref 20.0–28.0)
Calcium, Ion: 1.1 mmol/L — ABNORMAL LOW (ref 1.15–1.40)
HCT: 35 % — ABNORMAL LOW (ref 36.0–46.0)
Hemoglobin: 11.9 g/dL — ABNORMAL LOW (ref 12.0–15.0)
O2 Saturation: 100 %
Potassium: 3.9 mmol/L (ref 3.5–5.1)
Sodium: 136 mmol/L (ref 135–145)
TCO2: 27 mmol/L (ref 22–32)
pCO2 arterial: 39.6 mmHg (ref 32.0–48.0)
pH, Arterial: 7.415 (ref 7.350–7.450)
pO2, Arterial: 267 mmHg — ABNORMAL HIGH (ref 83.0–108.0)

## 2021-07-09 LAB — URINALYSIS, ROUTINE W REFLEX MICROSCOPIC
Bacteria, UA: NONE SEEN
Bilirubin Urine: NEGATIVE
Glucose, UA: >=500 mg/dL — AB
Hgb urine dipstick: NEGATIVE
Ketones, ur: 5 mg/dL — AB
Nitrite: NEGATIVE
Protein, ur: >=300 mg/dL — AB
Specific Gravity, Urine: 1.015 (ref 1.005–1.030)
pH: 8 (ref 5.0–8.0)

## 2021-07-09 LAB — LACTIC ACID, PLASMA: Lactic Acid, Venous: 2.2 mmol/L (ref 0.5–1.9)

## 2021-07-09 LAB — COMPREHENSIVE METABOLIC PANEL
ALT: 16 U/L (ref 0–44)
AST: 22 U/L (ref 15–41)
Albumin: 4 g/dL (ref 3.5–5.0)
Alkaline Phosphatase: 81 U/L (ref 38–126)
Anion gap: 15 (ref 5–15)
BUN: 52 mg/dL — ABNORMAL HIGH (ref 8–23)
CO2: 25 mmol/L (ref 22–32)
Calcium: 9.2 mg/dL (ref 8.9–10.3)
Chloride: 96 mmol/L — ABNORMAL LOW (ref 98–111)
Creatinine, Ser: 6.78 mg/dL — ABNORMAL HIGH (ref 0.44–1.00)
GFR, Estimated: 6 mL/min — ABNORMAL LOW (ref >60)
Glucose, Bld: 349 mg/dL — ABNORMAL HIGH (ref 70–99)
Potassium: 4 mmol/L (ref 3.5–5.1)
Sodium: 136 mmol/L (ref 135–145)
Total Bilirubin: 0.9 mg/dL (ref 0.3–1.2)
Total Protein: 7.7 g/dL (ref 6.5–8.1)

## 2021-07-09 LAB — I-STAT CHEM 8, ED
BUN: 52 mg/dL — ABNORMAL HIGH (ref 8–23)
Calcium, Ion: 1.1 mmol/L — ABNORMAL LOW (ref 1.15–1.40)
Chloride: 102 mmol/L (ref 98–111)
Creatinine, Ser: 6.6 mg/dL — ABNORMAL HIGH (ref 0.44–1.00)
Glucose, Bld: 362 mg/dL — ABNORMAL HIGH (ref 70–99)
HCT: 37 % (ref 36.0–46.0)
Hemoglobin: 12.6 g/dL (ref 12.0–15.0)
Potassium: 4.1 mmol/L (ref 3.5–5.1)
Sodium: 136 mmol/L (ref 135–145)
TCO2: 28 mmol/L (ref 22–32)

## 2021-07-09 LAB — CBC
HCT: 36.3 % (ref 36.0–46.0)
Hemoglobin: 11.5 g/dL — ABNORMAL LOW (ref 12.0–15.0)
MCH: 29.4 pg (ref 26.0–34.0)
MCHC: 31.7 g/dL (ref 30.0–36.0)
MCV: 92.8 fL (ref 80.0–100.0)
Platelets: 311 10*3/uL (ref 150–400)
RBC: 3.91 MIL/uL (ref 3.87–5.11)
RDW: 15 % (ref 11.5–15.5)
WBC: 12.7 10*3/uL — ABNORMAL HIGH (ref 4.0–10.5)
nRBC: 0 % (ref 0.0–0.2)

## 2021-07-09 LAB — ECHOCARDIOGRAM LIMITED
Calc EF: 40.4 %
Height: 66 in
S' Lateral: 3.2 cm
Single Plane A2C EF: 37.8 %
Single Plane A4C EF: 46.1 %
Weight: 2645.52 oz

## 2021-07-09 LAB — COOXEMETRY PANEL
Carboxyhemoglobin: 1.4 % (ref 0.5–1.5)
Methemoglobin: 1.2 % (ref 0.0–1.5)
O2 Saturation: 99.6 %
Total hemoglobin: 9.9 g/dL — ABNORMAL LOW (ref 12.0–16.0)

## 2021-07-09 LAB — ETHANOL: Alcohol, Ethyl (B): <10 mg/dL (ref <10)

## 2021-07-09 LAB — SAMPLE TO BLOOD BANK

## 2021-07-09 LAB — MRSA NEXT GEN BY PCR, NASAL: MRSA by PCR Next Gen: NOT DETECTED

## 2021-07-09 LAB — GLUCOSE, CAPILLARY
Glucose-Capillary: 339 mg/dL — ABNORMAL HIGH (ref 70–99)
Glucose-Capillary: 203 mg/dL — ABNORMAL HIGH (ref 70–99)
Glucose-Capillary: 119 mg/dL — ABNORMAL HIGH (ref 70–99)

## 2021-07-09 LAB — APTT: aPTT: 24 seconds (ref 24–36)

## 2021-07-09 LAB — RESP PANEL BY RT-PCR (FLU A&B, COVID) ARPGX2
Influenza A by PCR: NEGATIVE
Influenza B by PCR: NEGATIVE
SARS Coronavirus 2 by RT PCR: NEGATIVE

## 2021-07-09 LAB — HEMOGLOBIN A1C
Hgb A1c MFr Bld: 8.8 % — ABNORMAL HIGH (ref 4.8–5.6)
Mean Plasma Glucose: 205.86 mg/dL

## 2021-07-09 LAB — BRAIN NATRIURETIC PEPTIDE: B Natriuretic Peptide: 3314.8 pg/mL — ABNORMAL HIGH (ref 0.0–100.0)

## 2021-07-09 LAB — TROPONIN I (HIGH SENSITIVITY)
Troponin I (High Sensitivity): 531 ng/L (ref <18)
Troponin I (High Sensitivity): 588 ng/L (ref <18)
Troponin I (High Sensitivity): 859 ng/L (ref <18)
Troponin I (High Sensitivity): 804 ng/L (ref <18)

## 2021-07-09 LAB — PROTIME-INR
INR: 1 (ref 0.8–1.2)
Prothrombin Time: 12.9 seconds (ref 11.4–15.2)

## 2021-07-09 LAB — HEPATITIS B SURFACE ANTIGEN: Hepatitis B Surface Ag: NONREACTIVE

## 2021-07-09 LAB — HEPATITIS B SURFACE ANTIBODY,QUALITATIVE: Hep B S Ab: REACTIVE — AB

## 2021-07-09 MED ORDER — PROPOFOL 1000 MG/100ML IV EMUL
5.0000 ug/kg/min | INTRAVENOUS | Status: DC
  Administered 2021-07-09: 30 ug/kg/min via INTRAVENOUS

## 2021-07-09 MED ORDER — SODIUM CHLORIDE 0.9% FLUSH
10.0000 mL | Freq: Two times a day (BID) | INTRAVENOUS | Status: DC
  Administered 2021-07-10 – 2021-07-11 (×3): 10 mL
  Administered 2021-07-11: 30 mL
  Administered 2021-07-12: 10 mL
  Administered 2021-07-13: 30 mL

## 2021-07-09 MED ORDER — NOREPINEPHRINE 4 MG/250ML-% IV SOLN
0.0000 ug/min | INTRAVENOUS | Status: DC
  Administered 2021-07-09: 1 ug/min via INTRAVENOUS
  Filled 2021-07-09: qty 250

## 2021-07-09 MED ORDER — DOCUSATE SODIUM 50 MG/5ML PO LIQD: 100.0000 mg | Freq: Two times a day (BID) | ORAL | Status: DC

## 2021-07-09 MED ORDER — FENTANYL CITRATE PF 50 MCG/ML IJ SOSY
25.0000 ug | PREFILLED_SYRINGE | Freq: Once | INTRAMUSCULAR | Status: AC
  Administered 2021-07-09: 25 ug via INTRAVENOUS

## 2021-07-09 MED ORDER — ALBUMIN HUMAN 25 % IV SOLN
INTRAVENOUS | Status: AC
  Administered 2021-07-09: 25 g via INTRAVENOUS
  Filled 2021-07-09: qty 100

## 2021-07-09 MED ORDER — CHLORHEXIDINE GLUCONATE CLOTH 2 % EX PADS
6.0000 | MEDICATED_PAD | Freq: Every day | CUTANEOUS | Status: DC
  Administered 2021-07-10: 6 via TOPICAL

## 2021-07-09 MED ORDER — MIDAZOLAM HCL 2 MG/2ML IJ SOLN: 2.0000 mg | Freq: Once | INTRAMUSCULAR | Status: AC

## 2021-07-09 MED ORDER — SODIUM CHLORIDE 0.9 % IV SOLN
1.0000 g | Freq: Once | INTRAVENOUS | Status: DC
  Administered 2021-07-09: 1 g via INTRAVENOUS
  Filled 2021-07-09: qty 10

## 2021-07-09 MED ORDER — POLYETHYLENE GLYCOL 3350 17 G PO PACK: 17.0000 g | PACK | Freq: Every day | ORAL | Status: DC

## 2021-07-09 MED ORDER — ALTEPLASE 2 MG IJ SOLR
2.0000 mg | Freq: Once | INTRAMUSCULAR | Status: DC | PRN
  Filled 2021-07-09: qty 2

## 2021-07-09 MED ORDER — ETOMIDATE 2 MG/ML IV SOLN
20.0000 mg | Freq: Once | INTRAVENOUS | Status: AC
  Administered 2021-07-09: 20 mg via INTRAVENOUS

## 2021-07-09 MED ORDER — SODIUM CHLORIDE 0.9 % IV SOLN: 100.0000 mL | INTRAVENOUS | Status: DC | PRN

## 2021-07-09 MED ORDER — ETOMIDATE 2 MG/ML IV SOLN
INTRAVENOUS | Status: DC | PRN
  Administered 2021-07-09: 20 mg via INTRAVENOUS

## 2021-07-09 MED ORDER — HEPARIN (PORCINE) 25000 UT/250ML-% IV SOLN
1200.0000 [IU]/h | INTRAVENOUS | Status: DC
  Administered 2021-07-09: 900 [IU]/h via INTRAVENOUS
  Administered 2021-07-11: 950 [IU]/h via INTRAVENOUS
  Filled 2021-07-09 (×3): qty 250

## 2021-07-09 MED ORDER — PANTOPRAZOLE SODIUM 40 MG IV SOLR
40.0000 mg | Freq: Every day | INTRAVENOUS | Status: DC
  Administered 2021-07-09 – 2021-07-10 (×2): 40 mg via INTRAVENOUS
  Filled 2021-07-09 (×2): qty 40

## 2021-07-09 MED ORDER — ALBUMIN HUMAN 25 % IV SOLN: 25.0000 g | Freq: Once | INTRAVENOUS | Status: AC

## 2021-07-09 MED ORDER — ASPIRIN EC 81 MG PO TBEC: 81.0000 mg | DELAYED_RELEASE_TABLET | Freq: Every day | ORAL | Status: DC

## 2021-07-09 MED ORDER — AMLODIPINE BESYLATE 10 MG PO TABS: 10.0000 mg | ORAL_TABLET | Freq: Every day | ORAL | Status: DC

## 2021-07-09 MED ORDER — HEPARIN BOLUS VIA INFUSION
3000.0000 [IU] | Freq: Once | INTRAVENOUS | Status: AC
  Administered 2021-07-09: 3000 [IU] via INTRAVENOUS
  Filled 2021-07-09: qty 3000

## 2021-07-09 MED ORDER — PROPOFOL 1000 MG/100ML IV EMUL
0.0000 ug/kg/min | INTRAVENOUS | Status: DC
  Administered 2021-07-09: 30 ug/kg/min via INTRAVENOUS
  Filled 2021-07-09 (×2): qty 100

## 2021-07-09 MED ORDER — LIDOCAINE HCL (PF) 1 % IJ SOLN: 5.0000 mL | INTRAMUSCULAR | Status: DC | PRN

## 2021-07-09 MED ORDER — LACTATED RINGERS IV SOLN: INTRAVENOUS | Status: DC

## 2021-07-09 MED ORDER — FENTANYL 2500MCG IN NS 250ML (10MCG/ML) PREMIX INFUSION
25.0000 ug/h | INTRAVENOUS | Status: DC
  Administered 2021-07-09: 50 ug/h via INTRAVENOUS
  Filled 2021-07-09: qty 250

## 2021-07-09 MED ORDER — FENTANYL BOLUS VIA INFUSION
25.0000 ug | INTRAVENOUS | Status: DC | PRN
  Filled 2021-07-09: qty 100

## 2021-07-09 MED ORDER — DOCUSATE SODIUM 50 MG/5ML PO LIQD
100.0000 mg | Freq: Two times a day (BID) | ORAL | Status: DC
  Administered 2021-07-09 – 2021-07-10 (×2): 100 mg
  Filled 2021-07-09 (×2): qty 10

## 2021-07-09 MED ORDER — ASPIRIN EC 325 MG PO TBEC: 325.0000 mg | DELAYED_RELEASE_TABLET | Freq: Once | ORAL | Status: DC

## 2021-07-09 MED ORDER — ORAL CARE MOUTH RINSE
15.0000 mL | OROMUCOSAL | Status: DC
  Administered 2021-07-10 (×9): 15 mL via OROMUCOSAL

## 2021-07-09 MED ORDER — MIDAZOLAM HCL 2 MG/2ML IJ SOLN
INTRAMUSCULAR | Status: AC
  Administered 2021-07-09: 2 mg via INTRAVENOUS
  Filled 2021-07-09: qty 2

## 2021-07-09 MED ORDER — ROCURONIUM BROMIDE 50 MG/5ML IV SOLN
INTRAVENOUS | Status: DC | PRN
  Administered 2021-07-09: 100 mg via INTRAVENOUS

## 2021-07-09 MED ORDER — ROCURONIUM BROMIDE 50 MG/5ML IV SOLN
100.0000 mg | Freq: Once | INTRAVENOUS | Status: AC
  Administered 2021-07-09: 100 mg via INTRAVENOUS
  Filled 2021-07-09: qty 10

## 2021-07-09 MED ORDER — CHLORHEXIDINE GLUCONATE 0.12% ORAL RINSE (MEDLINE KIT)
15.0000 mL | Freq: Two times a day (BID) | OROMUCOSAL | Status: DC
  Administered 2021-07-09 – 2021-07-12 (×6): 15 mL via OROMUCOSAL

## 2021-07-09 MED ORDER — ASPIRIN 81 MG PO CHEW
81.0000 mg | CHEWABLE_TABLET | Freq: Every day | ORAL | Status: DC
  Administered 2021-07-10: 81 mg
  Filled 2021-07-09: qty 1

## 2021-07-09 MED ORDER — INSULIN ASPART 100 UNIT/ML IJ SOLN
0.0000 [IU] | INTRAMUSCULAR | Status: DC
  Administered 2021-07-09: 2 [IU] via SUBCUTANEOUS
  Administered 2021-07-10: 1 [IU] via SUBCUTANEOUS
  Administered 2021-07-10: 3 [IU] via SUBCUTANEOUS
  Administered 2021-07-11: 2 [IU] via SUBCUTANEOUS
  Administered 2021-07-11 – 2021-07-12 (×3): 1 [IU] via SUBCUTANEOUS
  Administered 2021-07-12: 2 [IU] via SUBCUTANEOUS
  Administered 2021-07-13 (×2): 1 [IU] via SUBCUTANEOUS

## 2021-07-09 MED ORDER — ATORVASTATIN CALCIUM 80 MG PO TABS
80.0000 mg | ORAL_TABLET | Freq: Every day | ORAL | Status: DC
  Administered 2021-07-09 – 2021-07-10 (×2): 80 mg
  Filled 2021-07-09 (×2): qty 1

## 2021-07-09 MED ORDER — NITROGLYCERIN IN D5W 200-5 MCG/ML-% IV SOLN
0.0000 ug/min | INTRAVENOUS | Status: DC
Start: 2021-07-09 — End: 2021-07-09
  Administered 2021-07-09: 50 ug/min via INTRAVENOUS
  Filled 2021-07-09: qty 250

## 2021-07-09 MED ORDER — VANCOMYCIN HCL 1500 MG/300ML IV SOLN
1500.0000 mg | Freq: Once | INTRAVENOUS | Status: AC
  Administered 2021-07-09: 1500 mg via INTRAVENOUS
  Filled 2021-07-09: qty 300

## 2021-07-09 MED ORDER — PENTAFLUOROPROP-TETRAFLUOROETH EX AERO
1.0000 "application " | INHALATION_SPRAY | CUTANEOUS | Status: DC | PRN
  Filled 2021-07-09: qty 116

## 2021-07-09 MED ORDER — SODIUM CHLORIDE 0.9 % IV SOLN: 2.0000 g | INTRAVENOUS | Status: DC

## 2021-07-09 MED ORDER — HEPARIN SODIUM (PORCINE) 5000 UNIT/ML IJ SOLN
5000.0000 [IU] | Freq: Three times a day (TID) | INTRAMUSCULAR | Status: DC
  Administered 2021-07-09: 5000 [IU] via SUBCUTANEOUS
  Filled 2021-07-09: qty 1

## 2021-07-09 MED ORDER — ASPIRIN 81 MG PO CHEW
324.0000 mg | CHEWABLE_TABLET | Freq: Once | ORAL | Status: AC
  Administered 2021-07-09: 324 mg
  Filled 2021-07-09: qty 4

## 2021-07-09 MED ORDER — INSULIN DETEMIR 100 UNIT/ML ~~LOC~~ SOLN
10.0000 [IU] | Freq: Every day | SUBCUTANEOUS | Status: DC
  Administered 2021-07-10 – 2021-07-12 (×3): 10 [IU] via SUBCUTANEOUS
  Filled 2021-07-09 (×6): qty 0.1

## 2021-07-09 MED ORDER — SODIUM CHLORIDE 0.9 % IV SOLN
2.0000 g | Freq: Once | INTRAVENOUS | Status: AC
  Administered 2021-07-09: 2 g via INTRAVENOUS
  Filled 2021-07-09: qty 2

## 2021-07-09 MED ORDER — HEPARIN SODIUM (PORCINE) 1000 UNIT/ML DIALYSIS
1000.0000 [IU] | INTRAMUSCULAR | Status: DC | PRN
  Filled 2021-07-09 (×2): qty 1

## 2021-07-09 MED ORDER — PROPOFOL 10 MG/ML IV BOLUS
50.0000 mg | Freq: Once | INTRAVENOUS | Status: AC
  Administered 2021-07-09: 50 mg via INTRAVENOUS

## 2021-07-09 MED ORDER — VANCOMYCIN HCL 750 MG/150ML IV SOLN
750.0000 mg | INTRAVENOUS | Status: DC
  Filled 2021-07-09: qty 150

## 2021-07-09 MED ORDER — SODIUM CHLORIDE 0.9 % IV SOLN
INTRAVENOUS | Status: DC | PRN
  Administered 2021-07-09 (×2): 500 mL via INTRAVENOUS

## 2021-07-09 MED ORDER — CLEVIDIPINE BUTYRATE 0.5 MG/ML IV EMUL: 0.0000 mg/h | INTRAVENOUS | Status: DC

## 2021-07-09 MED ORDER — LIDOCAINE-PRILOCAINE 2.5-2.5 % EX CREA
1.0000 "application " | TOPICAL_CREAM | CUTANEOUS | Status: DC | PRN
  Filled 2021-07-09: qty 5

## 2021-07-09 MED ORDER — DOCUSATE SODIUM 50 MG/5ML PO LIQD: 100.0000 mg | Freq: Two times a day (BID) | ORAL | Status: DC | PRN

## 2021-07-09 MED ORDER — POLYETHYLENE GLYCOL 3350 17 G PO PACK: 17.0000 g | PACK | Freq: Every day | ORAL | Status: DC | PRN

## 2021-07-09 MED ORDER — SODIUM CHLORIDE 0.9% FLUSH: 10.0000 mL | INTRAVENOUS | Status: DC | PRN

## 2021-07-09 NOTE — Progress Notes (Signed)
   07/09/21 1820  Vitals  Temp (!) 97.5 F (36.4 C)  Temp Source Oral  BP 132/75  BP Location Right Arm  BP Method Automatic  Patient Position (if appropriate) Lying  Pulse Rate 66  Pulse Rate Source Monitor  ECG Heart Rate 66  Resp 20  Oxygen Therapy  SpO2 100 %  O2 Device Ventilator  Post-Hemodialysis Assessment  Rinseback Volume (mL) 250 mL  KECN 288 V  Dialyzer Clearance Lightly streaked  Duration of HD Treatment -hour(s) 3.5 hour(s)  Hemodialysis Intake (mL) 500 mL  UF Total -Machine (mL) 1600 mL  Net UF (mL) 1100 mL  Tolerated HD Treatment No (Comment) (see progress note)  Post-Hemodialysis Comments tx complete-pt stable  AVG/AVF Arterial Site Held (minutes) 10 minutes  AVG/AVF Venous Site Held (minutes) 10 minutes  Fistula / Graft Left Upper arm Arteriovenous fistula  Placement Date/Time: 02/27/21 0923   Placed prior to admission: No  Orientation: Left  Access Location: Upper arm  Access Type: (c) Arteriovenous fistula  Site Condition No complications  Fistula / Graft Assessment Present;Thrill;Bruit  Status Flushed;Deaccessed  Drainage Description None  Hemodialysis Catheter Right Subclavian  No placement date or time found.   Placed prior to admission: Yes  Orientation: Right  Access Location: Subclavian  Site Condition No complications  Blue Lumen Status Dead end cap in place  Dressing Status Clean;Dry;Intact  Antimicrobial disc in place? Yes  Interventions New dressing;Dressing changed  Drainage Description None  Dressing Change Due 07/16/21  Post treatment catheter status Capped and Clamped  Pt tolerated HD tx poorly, SBP running in low 80s-90s majority of tx, despite albumin 25 g and levophed initiation. UF=1.1 liters net. Pt stable at present time. Daughter at bedside.

## 2021-07-09 NOTE — Progress Notes (Addendum)
Interval Progress Note:   Patient with SBP drop after HD initiation. Warm on exam. Questionable dynamic EKG changes. Trop elevated but BNP is markedly so. Most c/w stress induced cardiomyopathy; apparently similar presentation last year was induced by bacteremia so will re-institute broad spectrum abx pending culture data. Fine to start levophed Lighten up propofol Family updated  ADDENDUM Troponin with some increase (531 > 588)  EKG with persistent ST elevation in V2-3 despite improvement in BP. Will consult cardiology for possible ischemic evaluation.    Dr. Jose Persia Internal Medicine PGY-3 07/09/2021, 3:55 PM

## 2021-07-09 NOTE — ED Notes (Signed)
Verbal order received from [place] for Versed 2 mg d/t rousing through being maxed out on Propofol.

## 2021-07-09 NOTE — ED Notes (Signed)
Pt began opening her eyes during ECHO while at bedside, sedation meds titrated, BP is tolerating changes so far.

## 2021-07-09 NOTE — ED Notes (Signed)
EDP Miller inserted EJ in Lt neck.

## 2021-07-09 NOTE — Progress Notes (Signed)
[place] for heparin Indication: chest pain/ACS  No Known Allergies  Patient Measurements: Height: '5\' 6"'$  (167.6 cm) Weight: 75 kg (165 lb 5.5 oz) IBW/kg (Calculated) : 59.3 Heparin Dosing Weight: 75kg  Vital Signs: Temp: (P) 96.7 F (35.9 C) (09/13 1427) Temp Source: (P) Axillary (09/13 1427) BP: 183/111 (09/13 1139) Pulse Rate: 109 (09/13 1139)  Labs: Recent Labs    07/09/21 0750 07/09/21 0804 07/09/21 0856 07/09/21 1317  HGB 11.5* 12.6 11.9*  --   HCT 36.3 37.0 35.0*  --   PLT 311  --   --   --   APTT  --   --  24  --   LABPROT 12.9  --   --   --   INR 1.0  --   --   --   CREATININE 6.78* 6.60*  --   --   TROPONINIHS  --   --   --  531*    Estimated Creatinine Clearance: 8.1 mL/min (A) (by C-G formula based on SCr of 6.6 mg/dL (H)).   Medical History: Past Medical History:  Diagnosis Date   Anemia    Chronic kidney disease    Constipation    Diabetes mellitus    Type II   Dyspnea    with exertion   History of blood transfusion    Hyperlipemia    Hypertension    Pneumonia 2019    Assessment: 59 yoF with ESRD on HD admitted with hypertensive urgency. Trops bumped likely due to demand, covering with heparin for possible ACS for now. CBC wnl, no AC PTA. SQ heparin given ~1130.  Goal of Therapy:  Heparin level 0.3-0.7 units/ml Monitor platelets by anticoagulation protocol: Yes   Plan:  Heparin 3000 units x1 then 900 units/h Check heparin level in 8h Daily heparin level and CBC   Arrie Senate, PharmD, [place], [facility] Clinical Pharmacist [phone] Please check AMION for all [facility] Pharmacy numbers 07/09/2021

## 2021-07-09 NOTE — Progress Notes (Signed)
Pharmacy Antibiotic Note  Melissa Howe is a 70 y.o. female admitted on 07/09/2021 with SOB. Pharmacy has been consulted for vancomycin and cefepime dosing for broad spectrum ABX coverage for sepsis. Pt has hx ESRD on HD TTS, currently undergoing HD now.  Plan: Vancomycin '1500mg'$  IV x1 then '750mg'$  IV qHD TTS Cefepime 2g IV x1 then 2g IV qHD TTS Follow HD schedule closely   Height: '5\' 6"'$  (167.6 cm) Weight: 75 kg (165 lb 5.5 oz) IBW/kg (Calculated) : 59.3  Temp (24hrs), Avg:97 F (36.1 C), Min:96.7 F (35.9 C), Max:97.2 F (36.2 C)  Recent Labs  Lab 07/09/21 0750 07/09/21 0804  WBC 12.7*  --   CREATININE 6.78* 6.60*  LATICACIDVEN 2.2*  --     Estimated Creatinine Clearance: 8.1 mL/min (A) (by C-G formula based on SCr of 6.6 mg/dL (H)).    No Known Allergies  Antimicrobials this admission: Vancomycin 9/13 >>  Cefepime 9/13 >>    Microbiology results: pending  Thank you for allowing pharmacy to be a part of this patient's care.  Arrie Senate, PharmD, BCPS, [facility] Clinical Pharmacist [phone] Please check AMION for all [facility] numbers 07/09/2021

## 2021-07-09 NOTE — ED Notes (Addendum)
This RN called Merrily Brittle about expected ETA and the change in drip rate.

## 2021-07-09 NOTE — ED Notes (Signed)
RT at bedside, IPRN  aware and willing to accept pt. Purple man is green.

## 2021-07-09 NOTE — ED Notes (Signed)
RT called to help move the patient up to her.

## 2021-07-09 NOTE — Progress Notes (Addendum)
[facility] Progress Note Patient Name: Melissa Howe DOB: 08-29-1951 MRN: [id number]   Date of Service  07/09/2021  HPI/Events of Note  Patient's Troponin has peaked (last Troponin is 804, down from 859)  eICU Interventions  No intervention.Kerry Kass Marq Rebello 07/09/2021, 11:44 PM

## 2021-07-09 NOTE — Progress Notes (Addendum)
Inpatient Diabetes Program Recommendations  AACE/ADA: New Consensus Statement on Inpatient Glycemic Control (2015)  Target Ranges:  Prepandial:   less than 140 mg/dL      Peak postprandial:   less than 180 mg/dL (1-2 hours)      Critically ill patients:  140 - 180 mg/dL   Lab Results  Component Value Date   GLUCAP 143 (H) 04/17/2021   HGBA1C 8.7 (H) 07/21/2020    Review of Glycemic Control Results for Melissa Howe, DOOL (MRN [id number]) as of 07/09/2021 11:08  Ref. Range 07/09/2021 07:50 07/09/2021 08:04  Glucose Latest Ref Range: 70 - 99 mg/dL 349 (H) 362 (H)   Diabetes history: DM 2 Outpatient Diabetes medications:  Levemir 30 units q HS Current orders for Inpatient glycemic control:  Novolog 0-6 units q 4 hours, Levemir 10 units q HS  Inpatient Diabetes Program Recommendations:   May consider ICU glycemic control order set- sensitive.   Thanks  Adah Perl, RN, BC-ADM Inpatient Diabetes Coordinator Pager [phone]  (8a-5p)

## 2021-07-09 NOTE — ED Notes (Signed)
Pt's dialysis access in her upper left arm is still cannulated from her dialysis center, [facility] is aware.

## 2021-07-09 NOTE — ED Triage Notes (Signed)
Pt BIB [facility] from dialysis d/t Resp Distress. After she was cannulated she became very SOB & required NRB to breath. EMS reports upper lung sounds were "wheezy" & BP of 206/98, pulse 120, CBG 320, vomiting occurred, A/Ox4, Verbal-  able to make needs known. Does not wear O2 at baseline.

## 2021-07-09 NOTE — Progress Notes (Signed)
Pt transported to 3M13 from ED TRA A without any complications.

## 2021-07-09 NOTE — H&P (Signed)
NAME:  Melissa Howe, MRN:  [id number], DOB:  1951-04-26, LOS: 0 ADMISSION DATE:  07/09/2021, CONSULTATION DATE:  07/09/2021 REFERRING MD:  EDP CHIEF COMPLAINT:  Respiratory distress   History of Present Illness:  Melissa Howe is a 70 y/o female with a PMHx of ESRD (TTSa), T2DM, HFrEF (EF of 45%) who presents to the ED with c/o respiratory failure.   This patient is a 70 year old female with end-stage renal disease on dialysis, also known to have diabetes hypertension, she presented to dialysis this morning with some shortness of breath, it was not that bad so they hooked her up but did not start the dialysis session before she became severely short of breath diaphoretic and was noted to be severely hypertensive.  She was immediately transported by EMS to the hospital, the patient is not able to speak in more than 1 or 2 word sentences due to severe respiratory distress thus a level 5 caveat applies.  She is able to indicate to me that she has never had a heart attack, never had asthma, COPD or congestive heart failure to the best of her knowledge.  On examination, patient is intubated and sedated.   Pertinent  Medical History  ESRD on TTSa T2DM, insulin dependent  HTN HFrEF (45%)  Significant Hospital Events: Including procedures, antibiotic start and stop dates in addition to other pertinent events   9/13 >> Admitted for respiratory distress, intubated   Interim History / Subjective:  As above  Objective   Blood pressure (!) 179/96, pulse (!) 104, resp. rate 19, height '5\' 6"'$  (1.676 m), weight 75 kg, SpO2 100 %.    Vent Mode: PRVC FiO2 (%):  [100 %] 100 % Set Rate:  [20 bmp] 20 bmp Vt Set:  [470 mL] 470 mL PEEP:  [10 cmH20] 10 cmH20 Plateau Pressure:  [26 cmH20] 26 cmH20  No intake or output data in the 24 hours ending 07/09/21 0855 Filed Weights   07/09/21 0700  Weight: 75 kg   Examination: General: Critically ill appearing female.  HENT: Mucus membranes moist. ETT and  OT tube in place.  Lungs: Rhonchi diffusely. No wheezes.  Cardiovascular: Tachycardia with rates approximately 110. Regular rhythm with no murmurs, rubs or gallops  Abdomen: Soft and non-tender. Hypoactive bowel sounds.  Extremities: 1+ pitting edema bilaterally. No rashes or lesions.  Neuro: Sedated.   Resolved Hospital Problem list   N/A  Assessment & Plan:   Hypertensive Emergency  Inciting event unknown at this time. Differential includes cardiogenic including ischemic event versus infectious given previous similar event was secondary to MSSA bacteremia. EKG with T wave inversion in V5 and V6 only. No known missed sessions of HD.   - Cleviprex gtt with goal SBP of 160 - Troponin pending - Repeat TTE  - Blood cultures pending   Pulmonary Edema  Acute Respiratory Distress  Flash pulmonary edema secondary to hypertensive emergency.   - Continue full vent support - VAP measures in place  - Fluid removal via HD. Plan for emergent session today per Nephro  ESRD  - Nephrology consulted by EDP - Emergent HD session planned for today   Type 2 Diabetes Mellitus On Levemir 30 units at home. Quite hyperglycemic at this time  - SSI  - Levemir 10 units at bedtime - A1c pending    Best Practice (right click and "Reselect all SmartList Selections" daily)   Diet/type: NPO DVT prophylaxis: prophylactic heparin  GI prophylaxis: PPI Lines: Central line and yes and  it is still needed Foley:  N/A Code Status:  full code Last date of multidisciplinary goals of care discussion [N/A]  Labs   CBC: Recent Labs  Lab 07/09/21 0750 07/09/21 0804  WBC 12.7*  --   HGB 11.5* 12.6  HCT 36.3 37.0  MCV 92.8  --   PLT 311  --    Basic Metabolic Panel: Recent Labs  Lab 07/09/21 0750 07/09/21 0804  NA 136 136  K 4.0 4.1  CL 96* 102  CO2 25  --   GLUCOSE 349* 362*  BUN 52* 52*  CREATININE 6.78* 6.60*  CALCIUM 9.2  --    GFR: Estimated Creatinine Clearance: 8.1 mL/min (A) (by  C-G formula based on SCr of 6.6 mg/dL (H)). Recent Labs  Lab 07/09/21 0750  WBC 12.7*   Liver Function Tests: Recent Labs  Lab 07/09/21 0750  AST 22  ALT 16  ALKPHOS 81  BILITOT 0.9  PROT 7.7  ALBUMIN 4.0   No results for input(s): LIPASE, AMYLASE in the last 168 hours. No results for input(s): AMMONIA in the last 168 hours.  ABG    Component Value Date/Time   TCO2 28 07/09/2021 0804   Coagulation Profile: Recent Labs  Lab 07/09/21 0750  INR 1.0   Cardiac Enzymes: No results for input(s): CKTOTAL, CKMB, CKMBINDEX, TROPONINI in the last 168 hours.  HbA1C: Hgb A1c MFr Bld  Date/Time Value Ref Range Status  07/21/2020 01:27 PM 8.7 (H) 4.8 - 5.6 % Final    Comment:    (NOTE) Pre diabetes:          5.7%-6.4%  Diabetes:              >6.4%  Glycemic control for   <7.0% adults with diabetes   12/30/2019 02:15 AM 6.5 (H) 4.8 - 5.6 % Final    Comment:    (NOTE) Pre diabetes:          5.7%-6.4% Diabetes:              >6.4% Glycemic control for   <7.0% adults with diabetes    CBG: No results for input(s): GLUCAP in the last 168 hours.  Review of Systems:   Negative except as noted above.   Past Medical History:  She,  has a past medical history of Anemia, Chronic kidney disease, Constipation, Diabetes mellitus, Dyspnea, History of blood transfusion, Hyperlipemia, Hypertension, and Pneumonia (2019).   Surgical History:   Past Surgical History:  Procedure Laterality Date   APPENDECTOMY     AV FISTULA PLACEMENT Left 10/05/2019   Procedure: INSERTION OF ARTERIOVENOUS (AV) GORE-TEX VASCULAR GRAFT LEFT ARM;  Surgeon: [name], MD;  Location: [facility];  Service: Vascular;  Laterality: Left;   AV FISTULA PLACEMENT Left 02/27/2021   Procedure: LEFT ARM FIRST STAGE BASILIC VEIN  ARTERIOVENOUS (AV) FISTULA CREATION;  Surgeon: Waynetta Sandy, MD;  Location: [place];  Service: Vascular;  Laterality: Left;   [place] Left 04/17/2021    Procedure: LEFT SECOND STAGE [place];  Surgeon: Waynetta Sandy, MD;  Location: [place];  Service: Vascular;  Laterality: Left;   BIOPSY  07/24/2020   Procedure: BIOPSY;  Surgeon: Doran Stabler, MD;  Location: [facility];  Service: Gastroenterology;;   BUBBLE STUDY  01/02/2020   Procedure: BUBBLE STUDY;  Surgeon: Elouise Munroe, MD;  Location: [place];  Service: Cardiology;;   COLONOSCOPY WITH PROPOFOL N/A 07/24/2020   Procedure: COLONOSCOPY WITH PROPOFOL;  Surgeon:  Doran Stabler, MD;  Location: [place];  Service: Gastroenterology;  Laterality: N/A;   ESOPHAGOGASTRODUODENOSCOPY (EGD) WITH PROPOFOL N/A 07/24/2020   Procedure: ESOPHAGOGASTRODUODENOSCOPY (EGD) WITH PROPOFOL;  Surgeon: Doran Stabler, MD;  Location: [place];  Service: Gastroenterology;  Laterality: N/A;   EYE SURGERY Bilateral    Cataract   FOREIGN BODY REMOVAL Left 05/13/2018   Procedure: FOREIGN BODY REMOVAL left foot;  Surgeon: Meredith Pel, MD;  Location: [facility];  Service: Orthopedics;  Laterality: Left;   I & D EXTREMITY Left 05/21/2018   Procedure: LEFT FOOT DEBRIDEMENT AND WOUND CLOSURE;  Surgeon: Newt Minion, MD;  Location: [place];  Service: Orthopedics;  Laterality: Left;   POLYPECTOMY  07/24/2020   Procedure: POLYPECTOMY;  Surgeon: Doran Stabler, MD;  Location: [place];  Service: Gastroenterology;;   TEE WITHOUT CARDIOVERSION N/A 01/02/2020   Procedure: TRANSESOPHAGEAL ECHOCARDIOGRAM (TEE);  Surgeon: Elouise Munroe, MD;  Location: [facility];  Service: Cardiology;  Laterality: N/A;   TONSILLECTOMY     TUBAL LIGATION       Social History:   reports that she quit smoking about 37 years ago. Her smoking use included cigarettes. She has never used smokeless tobacco. She reports that she does not drink alcohol and does not use drugs.   Family History:  Her family history includes Diabetes in her mother; Hypertension in her mother and sister.    Allergies No Known Allergies   Home Medications  Prior to Admission medications   Medication Sig Start Date End Date Taking? Authorizing Provider  acetaminophen (TYLENOL) 500 MG tablet Take 500 mg by mouth every 6 (six) hours as needed for moderate pain or headache.    [provider]  amLODipine (NORVASC) 10 MG tablet Take 20 mg by mouth every evening.    [provider]  AURYXIA 1 GM 210 MG(Fe) tablet Take 420 mg by mouth See admin instructions. Take 2 tablets (420 mg) by mouth each meal and each snack 06/18/20   [provider]  calcitRIOL (ROCALTROL) 0.5 MCG capsule Take 5 capsules (2.5 mcg total) by mouth Every Tuesday,Thursday,and Saturday with dialysis. 07/24/20   Pokhrel, Corrie Mckusick, MD  Cinacalcet HCl (SENSIPAR PO) Take by mouth. 04/09/21 04/08/22  [provider]  HYDROcodone-acetaminophen (NORCO/VICODIN) 5-325 MG tablet Take 1 tablet by mouth every 4 (four) hours as needed for moderate pain. 04/17/21 04/17/22  Setzer, Edman Circle, PA-C  Insulin Detemir (LEVEMIR) 100 UNIT/ML Pen Inject 18 Units into the skin daily with breakfast. Patient taking differently: Inject 30 Units into the skin at bedtime. 08/11/19   Terrilee Croak, MD  Insulin Pen Needle 32G X 4 MM MISC Use with insulin pen to dispense insulin as directed 09/08/16   Tat, Shanon Brow, MD  iron sucrose in sodium chloride 0.9 % 100 mL Iron Sucrose (Venofer) 02/28/21 02/27/22  [provider]  lubiprostone (AMITIZA) 24 MCG capsule Take 24 mcg by mouth 2 (two) times daily as needed for constipation. 01/22/21   [provider]  Methoxy PEG-Epoetin Beta (MIRCERA IJ) Mircera 02/21/21 02/20/22  [provider]  multivitamin (RENA-VIT) TABS tablet Take 1 tablet by mouth every evening. 11/22/19   [provider]  VITAMIN D PO Take 4 tablets by mouth See admin instructions. Given at Dialysis 02/19/21   [provider]    Dr. Jose Persia Internal Medicine PGY-2  07/09/2021, 8:55  AM

## 2021-07-09 NOTE — Consult Note (Signed)
Cardiology Consultation:  Patient ID: Melissa Howe MRN: [id number]; DOB: November 01, 1950  Admit date: 07/09/2021 Date of Consult: 07/09/2021  Primary Care Provider: Sonia Side., FNP Primary Cardiologist: None  Primary Electrophysiologist:  None   Patient Profile:  Melissa Howe is a 70 y.o. female with a hx of diabetes, ESRD on hemodialysis, hypertension who is being seen today for the evaluation of elevated troponin/non-STEMI at the request of Ina Homes, MD.  History of Present Illness:  Melissa Howe presents with 1 day history of worsening shortness of breath and chest pain.  She went to dialysis and was very short of breath.  She was also noticeably very hypertensive.  They did not attempt dialysis and sent her to the emergency room.  On arrival to the emergency room she was noted to have a blood pressure of 183/111.  She was tachycardic.  She was intubated for airway protection.  Chest x-ray was concerning for bilateral opacifications concerning for edema versus atypical pneumonia.  She has been started on antibiotics.  She was started on a cleveprex drip due to hypertension.  Her blood pressure has quickly bottomed out and she is on Levophed.  She is on broad-spectrum antibiotic for possible pneumonia.  Laboratory data demonstrated elevated BNP at 3314.  Troponin was minimally elevated at 531 and has climbed to 588.  EKG demonstrates sinus rhythm heart rate 78 with ST elevation in leads V2 and V3 with T wave inversions in anterolateral leads.  There are no reciprocal ST depressions noted.  I did talk with her daughter and apparently she was having chest pain and trouble breathing prior to presenting to the hospital.  She remains critically ill in ICU on pressors and is undergoing emergent dialysis.  She has never had any cardiac stress test or heart disease per her daughter's report.  She is a never smoker.  She has been diabetic for 30 years.  She undoubtably has coronary artery disease.   I did review her CT PE study from 2019.  Surprisingly she has no coronary calcium.  This is a reassuring finding.  Cardiology was consulted for dynamic EKG changes with elevated troponin.  An echocardiogram was obtained that demonstrates an EF of roughly 40% with severe hypokinesis of the mid apical anterior myocardium and anterior septum.  Heart Pathway Score:       Past Medical History: Past Medical History:  Diagnosis Date   Anemia    Chronic kidney disease    Constipation    Diabetes mellitus    Type II   Dyspnea    with exertion   History of blood transfusion    Hyperlipemia    Hypertension    Pneumonia 2019    Past Surgical History: Past Surgical History:  Procedure Laterality Date   APPENDECTOMY     AV FISTULA PLACEMENT Left 10/05/2019   Procedure: INSERTION OF ARTERIOVENOUS (AV) GORE-TEX VASCULAR GRAFT LEFT ARM;  Surgeon: [name], MD;  Location: [place];  Service: Vascular;  Laterality: Left;   AV FISTULA PLACEMENT Left 02/27/2021   Procedure: LEFT ARM FIRST STAGE BASILIC VEIN  ARTERIOVENOUS (AV) FISTULA CREATION;  Surgeon: Waynetta Sandy, MD;  Location: [place];  Service: Vascular;  Laterality: Left;   [place] Left 04/17/2021   Procedure: LEFT SECOND STAGE [place];  Surgeon: Waynetta Sandy, MD;  Location: [place];  Service: Vascular;  Laterality: Left;   BIOPSY  07/24/2020   Procedure: BIOPSY;  Surgeon: Nelida Meuse  III, MD;  Location: [place];  Service: Gastroenterology;;   BUBBLE STUDY  01/02/2020   Procedure: BUBBLE STUDY;  Surgeon: Elouise Munroe, MD;  Location: [place];  Service: Cardiology;;   COLONOSCOPY WITH PROPOFOL N/A 07/24/2020   Procedure: COLONOSCOPY WITH PROPOFOL;  Surgeon: Doran Stabler, MD;  Location: [place];  Service: Gastroenterology;  Laterality: N/A;   ESOPHAGOGASTRODUODENOSCOPY (EGD) WITH PROPOFOL N/A 07/24/2020   Procedure: ESOPHAGOGASTRODUODENOSCOPY (EGD) WITH  PROPOFOL;  Surgeon: Doran Stabler, MD;  Location: [place];  Service: Gastroenterology;  Laterality: N/A;   EYE SURGERY Bilateral    Cataract   FOREIGN BODY REMOVAL Left 05/13/2018   Procedure: FOREIGN BODY REMOVAL left foot;  Surgeon: Meredith Pel, MD;  Location: [place];  Service: Orthopedics;  Laterality: Left;   I & D EXTREMITY Left 05/21/2018   Procedure: LEFT FOOT DEBRIDEMENT AND WOUND CLOSURE;  Surgeon: Newt Minion, MD;  Location: [place];  Service: Orthopedics;  Laterality: Left;   POLYPECTOMY  07/24/2020   Procedure: POLYPECTOMY;  Surgeon: Doran Stabler, MD;  Location: [place];  Service: Gastroenterology;;   TEE WITHOUT CARDIOVERSION N/A 01/02/2020   Procedure: TRANSESOPHAGEAL ECHOCARDIOGRAM (TEE);  Surgeon: Elouise Munroe, MD;  Location: [place];  Service: Cardiology;  Laterality: N/A;   TONSILLECTOMY     TUBAL LIGATION      Inpatient Medications: Scheduled Meds:  aspirin EC  325 mg Oral Once   [START ON 07/10/2021] aspirin EC  81 mg Oral Daily   Chlorhexidine Gluconate Cloth  6 each Topical Q0600   docusate  100 mg Per Tube BID   insulin aspart  0-6 Units Subcutaneous Q4H   insulin detemir  10 Units Subcutaneous QHS   pantoprazole  40 mg Intravenous Daily   polyethylene glycol  17 g Per Tube Daily   Continuous Infusions:  sodium chloride     sodium chloride     ceFEPime (MAXIPIME) IV     [START ON 07/10/2021] ceFEPime (MAXIPIME) IV     clevidipine Stopped (07/09/21 1300)   fentaNYL infusion INTRAVENOUS 150 mcg/hr (07/09/21 1120)   heparin 900 Units/hr (07/09/21 1600)   norepinephrine (LEVOPHED) Adult infusion Stopped (07/09/21 1556)   propofol (DIPRIVAN) infusion 50 mcg/kg/min (07/09/21 1058)   vancomycin     [START ON 07/10/2021] vancomycin     PRN Meds: sodium chloride, sodium chloride, alteplase, docusate, fentaNYL, heparin, lidocaine (PF), lidocaine-prilocaine, pentafluoroprop-tetrafluoroeth, polyethylene glycol  Allergies:    No  Known Allergies  Social History:   Social History   Socioeconomic History   Marital status: Divorced    Spouse name: Not on file   Number of children: Not on file   Years of education: Not on file   Highest education level: Not on file  Occupational History   Occupation: Retired  Tobacco Use   Smoking status: Former    Years: 4.00    Types: Cigarettes    Quit date: 01/19/1984    Years since quitting: 37.4   Smokeless tobacco: Never  Vaping Use   Vaping Use: Never used  Substance and Sexual Activity   Alcohol use: No   Drug use: No   Sexual activity: Not on file  Other Topics Concern   Not on file  Social History Narrative   Lives in [place] with her dtr.  Previously worked in a [place] but has been out on disability since 2009.  Recently moved from disability to "retired" when she turned 52.  Sedentary.  Knits frequently for  fun.   Social Determinants of Health   Financial Resource Strain: Not on file  Food Insecurity: Not on file  Transportation Needs: Not on file  Physical Activity: Not on file  Stress: Not on file  Social Connections: Not on file  Intimate Partner Violence: Not on file     Family History:    Family History  Problem Relation Age of Onset   Hypertension Mother    Diabetes Mother    Hypertension Sister      ROS:  All other ROS reviewed and negative. Pertinent positives noted in the HPI.     Physical Exam/Data:   Vitals:   07/09/21 1627 07/09/21 1630 07/09/21 1645 07/09/21 1700  BP: (!) 143/83 (!) 148/71 (!) 84/60 (!) 85/62  Pulse: 69 62 73 72  Resp: '20 20 15 20  '$ Temp:      TempSrc:      SpO2: 100% 100% 97% 94%  Weight:      Height:        Intake/Output Summary (Last 24 hours) at 07/09/2021 1714 Last data filed at 07/09/2021 1600 Gross per 24 hour  Intake 67.1 ml  Output --  Net 67.1 ml    Last 3 Weights 07/09/2021 07/09/2021 05/17/2021  Weight (lbs) 165 lb 5.5 oz 165 lb 5.5 oz 167 lb 1.6 oz  Weight (kg) 75 kg 75 kg 75.796 kg    Body  mass index is 26.69 kg/m.  General: Ill-appearing, intubated in the ICU Head: Atraumatic, normal size  Eyes: PEERLA, EOMI  Neck: Supple, no JVD Endocrine: No thryomegaly Cardiac: Normal S1, S2; RRR; no murmurs, rubs, or gallops Lungs: Diffuse rales and rhonchi bilaterally Abd: Soft, nontender, no hepatomegaly  Ext: No edema, pulses 2+ Musculoskeletal: No deformities Skin: Warm and dry, no rashes   Neuro: Intubated and sedated on the ventilator  EKG:  The EKG was personally reviewed and demonstrates: Normal sinus rhythm minimal ST elevation in lead V2 and V3 without reciprocal changes, T wave inversions noted in V2 through V5, pronounced from prior tracings which demonstrate nonspecific ST-T changes Telemetry:  Telemetry was personally reviewed and demonstrates: Sinus rhythm in the 70s  Relevant CV Studies: TTE 07/09/2021  1. Global hypokinesis worse in the anteroseptal and mid-apical anterior  myocardium. Left ventricular ejection fraction, by estimation, is 35 to  40%. The left ventricle has moderately decreased function. The left  ventricle demonstrates regional wall  motion abnormalities (see scoring diagram/findings for description). Left  ventricular diastolic parameters are consistent with Grade I diastolic  dysfunction (impaired relaxation).   2. Right ventricular systolic function is normal. There is mildly  elevated pulmonary artery systolic pressure.   3. The mitral valve is normal in structure. Mild mitral valve  regurgitation. No evidence of mitral stenosis.   4. The aortic valve is tricuspid. Aortic valve regurgitation is not  visualized. No aortic stenosis is present.   5. The inferior vena cava is dilated in size with <50% respiratory  variability, suggesting right atrial pressure of 15 mmHg.   Laboratory Data: High Sensitivity Troponin:   Recent Labs  Lab 07/09/21 1317 07/09/21 1450  TROPONINIHS 531* 588*     Cardiac EnzymesNo results for input(s): TROPONINI  in the last 168 hours. No results for input(s): TROPIPOC in the last 168 hours.  Chemistry Recent Labs  Lab 07/09/21 0750 07/09/21 0804 07/09/21 0856  NA 136 136 136  K 4.0 4.1 3.9  CL 96* 102  --   CO2 25  --   --  GLUCOSE 349* 362*  --   BUN 52* 52*  --   CREATININE 6.78* 6.60*  --   CALCIUM 9.2  --   --   GFRNONAA 6*  --   --   ANIONGAP 15  --   --     Recent Labs  Lab 07/09/21 0750  PROT 7.7  ALBUMIN 4.0  AST 22  ALT 16  ALKPHOS 81  BILITOT 0.9   Hematology Recent Labs  Lab 07/09/21 0750 07/09/21 0804 07/09/21 0856  WBC 12.7*  --   --   RBC 3.91  --   --   HGB 11.5* 12.6 11.9*  HCT 36.3 37.0 35.0*  MCV 92.8  --   --   MCH 29.4  --   --   MCHC 31.7  --   --   RDW 15.0  --   --   PLT 311  --   --    BNP Recent Labs  Lab 07/09/21 1450  BNP 3,314.8*    DDimer No results for input(s): DDIMER in the last 168 hours.  Radiology/Studies:  DG Chest Port 1 View  Result Date: 07/09/2021 CLINICAL DATA:  Endotracheal tube placement. EXAM: PORTABLE CHEST 1 VIEW COMPARISON:  07/09/2021, earlier same day FINDINGS: 0817 hours. Endotracheal tube tip is 2.3 cm above the base of the carina. The NG tube passes into the stomach although the distal tip position is not included on the film. Right IJ central line tip overlies the region of the SVC/RA junction. Diffuse parahilar airspace disease bilaterally suggests edema. Cardiopericardial silhouette is at upper limits of normal for size. Telemetry leads overlie the chest. IMPRESSION: Endotracheal tube tip is 2.3 cm above the base of the carina. Stable cardiopulmonary exam. Electronically Signed   By: Misty Stanley M.D.   On: 07/09/2021 08:42   DG Chest Portable 1 View  Result Date: 07/09/2021 CLINICAL DATA:  Shortness of breath. EXAM: PORTABLE CHEST 1 VIEW COMPARISON:  July 23, 2020. FINDINGS: The heart size and mediastinal contours are within normal limits. Interval development of bilateral reticular and interstitial  opacities concerning for pulmonary edema or atypical pneumonia. Right internal jugular catheter is noted with distal tip in expected position of right atrium. The visualized skeletal structures are unremarkable. IMPRESSION: Interval development of bilateral lung opacities concerning for edema or atypical pneumonia. Electronically Signed   By: Marijo Conception M.D.   On: 07/09/2021 08:09   ECHOCARDIOGRAM LIMITED  Result Date: 07/09/2021    ECHOCARDIOGRAM LIMITED REPORT   Patient Name:   Melissa Howe Date of Exam: 07/09/2021 Medical Rec #:  [id number]         Height:       66.0 in Accession #:    [id number]        Weight:       165.3 lb Date of Birth:  [birth year]          BSA:          1.844 m Patient Age:    89 years          BP:           190/97 mmHg Patient Gender: F                 HR:           93 bpm. Exam Location:  Inpatient Procedure: 3D Echo, Limited Echo, Cardiac Doppler and Color Doppler STAT ECHO Indications:    R06.02 SOB  History:  Patient has prior history of Echocardiogram examinations.                 Signs/Symptoms:Dizziness/Lightheadedness, Syncope and                 Bacteremia; Risk Factors:Hypertension, Diabetes and                 Dyslipidemia. Kidney disorder. Flash pulmonary edema.  Sonographer:    Roseanna Rainbow RDCS Referring Phys: [id number] Candee Furbish  Sonographer Comments: Echo performed with patient supine and on artificial respirator and Technically difficult study due to poor echo windows. IMPRESSIONS  1. Global hypokinesis worse in the anteroseptal and mid-apical anterior myocardium. Left ventricular ejection fraction, by estimation, is 35 to 40%. The left ventricle has moderately decreased function. The left ventricle demonstrates regional wall motion abnormalities (see scoring diagram/findings for description). Left ventricular diastolic parameters are consistent with Grade I diastolic dysfunction (impaired relaxation).  2. Right ventricular systolic function is normal.  There is mildly elevated pulmonary artery systolic pressure.  3. The mitral valve is normal in structure. Mild mitral valve regurgitation. No evidence of mitral stenosis.  4. The aortic valve is tricuspid. Aortic valve regurgitation is not visualized. No aortic stenosis is present.  5. The inferior vena cava is dilated in size with <50% respiratory variability, suggesting right atrial pressure of 15 mmHg. Comparison(s): Compared with the echo 12/2019, the echo now shows worse systolic function in the anterior and anterosepal myocardium. Previously, the systolic dysfunction was similar globally. FINDINGS  Left Ventricle: Global hypokinesis worse in the anteroseptal and mid-apical anterior myocardium. Left ventricular ejection fraction, by estimation, is 35 to 40%. The left ventricle has moderately decreased function. The left ventricle demonstrates regional wall motion abnormalities. The left ventricular internal cavity size was normal in size. There is no left ventricular hypertrophy. Left ventricular diastolic parameters are consistent with Grade I diastolic dysfunction (impaired relaxation). Right Ventricle: No increase in right ventricular wall thickness. Right ventricular systolic function is normal. There is mildly elevated pulmonary artery systolic pressure. The tricuspid regurgitant velocity is 2.37 m/s, and with an assumed right atrial  pressure of 15 mmHg, the estimated right ventricular systolic pressure is [id number] mmHg. Mitral Valve: The mitral valve is normal in structure. Mild mitral annular calcification. Mild mitral valve regurgitation. No evidence of mitral valve stenosis. Tricuspid Valve: The tricuspid valve is normal in structure. Tricuspid valve regurgitation is trivial. No evidence of tricuspid stenosis. Aortic Valve: The aortic valve is tricuspid. Aortic valve regurgitation is not visualized. No aortic stenosis is present. Pulmonic Valve: The pulmonic valve was normal in structure. Pulmonic valve  regurgitation is not visualized. No evidence of pulmonic stenosis. Aorta: The aortic root is normal in size and structure. Venous: The inferior vena cava is dilated in size with less than 50% respiratory variability, suggesting right atrial pressure of 15 mmHg. LEFT VENTRICLE PLAX 2D LVIDd:         3.80 cm LVIDs:         3.20 cm LV PW:         0.92 cm LV IVS:        1.08 cm  LV Volumes (MOD) LV vol d, MOD A2C: 69.0 ml LV vol d, MOD A4C: 67.9 ml LV vol s, MOD A2C: 42.9 ml LV vol s, MOD A4C: 36.6 ml LV SV MOD A2C:     26.1 ml LV SV MOD A4C:     67.9 ml LV SV MOD BP:  27.9 ml IVC IVC diam: 2.10 cm LEFT ATRIUM         Index LA diam:    3.00 cm 1.63 cm/m   AORTA Ao Root diam: 2.90 cm Ao Asc diam:  3.00 cm TRICUSPID VALVE TR Peak grad:   22.5 mmHg TR Vmax:        237.42 cm/s Skeet Latch MD Electronically signed by Skeet Latch MD Signature Date/Time: 07/09/2021/12:12:27 PM    Final     Assessment and Plan:   #Troponin elevation, demand ischemia versus non-STEMI #Hypertensive crisis #Pulmonary edema #Acute systolic heart failure, EF 35 to 40% with regional wall motion normalities -Admitted with hypertensive crisis and likely pulmonary edema versus pneumonia.  She is critically ill in the ICU on pressors.  She is undergoing emergent hemodialysis.  She is on broad-spectrum antibiotics in the case this is pneumonia. -She does have dynamic EKG changes with minimal ST elevation in leads V2 and V3 with T wave inversions in the same leads.  She also has anterolateral T wave inversions.  Echocardiogram shows EF is reduced around 40% with wall motion abnormality in the LAD distribution. -Her troponin elevation is minimal and flat.  BNP is much more impressive with values around 3000. -CT PE study from 2019 demonstrates no coronary calcium which is a reassuring finding. -Overall suspect this is a secondary process in the setting of severely elevated blood pressure.  She has had diabetes for over 30 years and  may indeed have coronary disease.  However I believe this is a secondary process given the minimal troponin elevation in the setting of severe blood pressure elevation and now what appears to be shock. -She is too sick for cardiac catheterization.  For now would recommend supportive care.  I have recommended pressors as you are currently doing.  I have given her aspirin 325 and we will start 81 mg daily tomorrow.  I have started her on Lipitor 80 mg daily.  No beta-blocker as she is on Levophed.  We will also start her on a heparin drip and treat her medically.  Given the dynamic EKG changes with minimal troponin elevation I think she will need coronary angiography but this will be done closer to discharge.  For now we will treat her shock state supportively. -This could be a component of cardiogenic and septic shock.  Would recommend coox off central line.  Levophed will be the pressor of choice given her current state.  She does not have a narrow pulse pressure.  Overall I feel sepsis could be a bigger issue here. -I agree with blood cultures and further work-up per critical care medicine.  We will follow along while she is here.  CRITICAL CARE Performed by: [place] T O'Neal  Total critical care time: 45 minutes. Critical care time was exclusive of separately billable procedures and treating other patients. Critical care was necessary to treat or prevent imminent or life-threatening deterioration. Critical care was time spent personally by me on the following activities: development of treatment plan with patient and/or surrogate as well as nursing, discussions with consultants, evaluation of patient's response to treatment, examination of patient, obtaining history from patient or surrogate, ordering and performing treatments and interventions, ordering and review of laboratory studies, ordering and review of radiographic studies, pulse oximetry and re-evaluation of patient's condition.  For questions or  updates, please contact [place] Please consult [email or web address] for contact info under   Signed, [place] Bells T. Audie Box, MD, [place]  07/09/2021 5:14 PM

## 2021-07-09 NOTE — ED Notes (Signed)
Before intubation pt was A/Ox4 she only had difficulty speaking d/t respiratory demands.

## 2021-07-09 NOTE — ED Notes (Signed)
OG inserted by [employer]

## 2021-07-09 NOTE — Consult Note (Addendum)
[facility] Renal Consultation Note    Indication for Consultation:  Management of ESRD/hemodialysis; anemia, hypertension/volume and secondary hyperparathyroidism  EO:[id number], Malva Limes., FNP  HPI: Melissa Howe is a 70 y.o. 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 She has been on HD since January 2021.  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   Reviewed outpatient HD records. Patient reported mild SOB, stomach aches, and nausea/vomiting pre-HD today. After HD was initiated, her SOB worsened and EMS was called. Her last HD was on 9/10 where she received full treatment. Appears she has been compliant with outpatient HD treatments. Patient presented to the ED in acute respiratory distress and volume overloaded. She was found to be hypertensive with sys in 200s. CXR showed bilateral pulmonary edema. Current electrolytes and Hgb stable. Seen and examined patient in ED. She is currently intubated and IV sedation infusing/ Blood cultures X 2 drawn. COVID (-). Patient will be admitted to the [place] for IHD at bedside.  Past Medical History:  Diagnosis Date   Anemia    Chronic kidney disease    Constipation    Diabetes mellitus    Type II   Dyspnea    with exertion   History of blood transfusion    Hyperlipemia    Hypertension    Pneumonia 2019   Past Surgical History:  Procedure Laterality Date   APPENDECTOMY     AV FISTULA PLACEMENT Left 10/05/2019   Procedure: INSERTION OF ARTERIOVENOUS (AV) GORE-TEX VASCULAR GRAFT LEFT ARM;  Surgeon: [name], MD;  Location: [place];  Service: Vascular;  Laterality: Left;   AV FISTULA PLACEMENT Left 02/27/2021   Procedure: LEFT ARM FIRST STAGE BASILIC VEIN  ARTERIOVENOUS (AV) FISTULA CREATION;  Surgeon: Waynetta Sandy, MD;  Location: [place];  Service: Vascular;  Laterality: Left;   [place] Left 04/17/2021   Procedure: LEFT  SECOND STAGE [place];  Surgeon: Waynetta Sandy, MD;  Location: [place];  Service: Vascular;  Laterality: Left;   BIOPSY  07/24/2020   Procedure: BIOPSY;  Surgeon: Doran Stabler, MD;  Location: [place];  Service: Gastroenterology;;   BUBBLE STUDY  01/02/2020   Procedure: BUBBLE STUDY;  Surgeon: Elouise Munroe, MD;  Location: [place];  Service: Cardiology;;   COLONOSCOPY WITH PROPOFOL N/A 07/24/2020   Procedure: COLONOSCOPY WITH PROPOFOL;  Surgeon: Doran Stabler, MD;  Location: [place];  Service: Gastroenterology;  Laterality: N/A;   ESOPHAGOGASTRODUODENOSCOPY (EGD) WITH PROPOFOL N/A 07/24/2020   Procedure: ESOPHAGOGASTRODUODENOSCOPY (EGD) WITH PROPOFOL;  Surgeon: Doran Stabler, MD;  Location: [place];  Service: Gastroenterology;  Laterality: N/A;   EYE SURGERY Bilateral    Cataract   FOREIGN BODY REMOVAL Left 05/13/2018   Procedure: FOREIGN BODY REMOVAL left foot;  Surgeon: Meredith Pel, MD;  Location: [place];  Service: Orthopedics;  Laterality: Left;   I & D EXTREMITY Left 05/21/2018   Procedure: LEFT FOOT DEBRIDEMENT AND WOUND CLOSURE;  Surgeon: Newt Minion, MD;  Location: [place];  Service: Orthopedics;  Laterality: Left;   POLYPECTOMY  07/24/2020   Procedure: POLYPECTOMY;  Surgeon: Doran Stabler, MD;  Location: [place];  Service: Gastroenterology;;   TEE WITHOUT CARDIOVERSION N/A 01/02/2020   Procedure: TRANSESOPHAGEAL ECHOCARDIOGRAM (TEE);  Surgeon: Elouise Munroe, MD;  Location: [place];  Service: Cardiology;  Laterality: N/A;   TONSILLECTOMY  TUBAL LIGATION     Family History  Problem Relation Age of Onset   Hypertension Mother    Diabetes Mother    Hypertension Sister    Social History:  reports that she quit smoking about 37 years ago. Her smoking use included cigarettes. She has never used smokeless tobacco. She reports that she does not drink alcohol and does not use drugs. No Known  Allergies Prior to Admission medications   Medication Sig Start Date End Date Taking? Authorizing Provider  acetaminophen (TYLENOL) 500 MG tablet Take 500 mg by mouth every 6 (six) hours as needed for moderate pain or headache.    [provider]  amLODipine (NORVASC) 10 MG tablet Take 20 mg by mouth every evening.    [provider]  AURYXIA 1 GM 210 MG(Fe) tablet Take 420 mg by mouth See admin instructions. Take 2 tablets (420 mg) by mouth each meal and each snack 06/18/20   [provider]  calcitRIOL (ROCALTROL) 0.5 MCG capsule Take 5 capsules (2.5 mcg total) by mouth Every Tuesday,Thursday,and Saturday with dialysis. 07/24/20   Pokhrel, Corrie Mckusick, MD  Cinacalcet HCl (SENSIPAR PO) Take by mouth. 04/09/21 04/08/22  [provider]  HYDROcodone-acetaminophen (NORCO/VICODIN) 5-325 MG tablet Take 1 tablet by mouth every 4 (four) hours as needed for moderate pain. 04/17/21 04/17/22  Setzer, Edman Circle, PA-C  Insulin Detemir (LEVEMIR) 100 UNIT/ML Pen Inject 18 Units into the skin daily with breakfast. Patient taking differently: Inject 30 Units into the skin at bedtime. 08/11/19   Terrilee Croak, MD  Insulin Pen Needle 32G X 4 MM MISC Use with insulin pen to dispense insulin as directed 09/08/16   Tat, Shanon Brow, MD  iron sucrose in sodium chloride 0.9 % 100 mL Iron Sucrose (Venofer) 02/28/21 02/27/22  [provider]  lubiprostone (AMITIZA) 24 MCG capsule Take 24 mcg by mouth 2 (two) times daily as needed for constipation. 01/22/21   [provider]  Methoxy PEG-Epoetin Beta (MIRCERA IJ) Mircera 02/21/21 02/20/22  [provider]  multivitamin (RENA-VIT) TABS tablet Take 1 tablet by mouth every evening. 11/22/19   [provider]  VITAMIN D PO Take 4 tablets by mouth See admin instructions. Given at Dialysis 02/19/21   [provider]   Current Facility-Administered Medications  Medication Dose Route Frequency Provider Last Rate Last Admin    0.9 %  sodium chloride infusion  100 mL Intravenous PRN Adelfa Koh, NP       0.9 %  sodium chloride infusion  100 mL Intravenous PRN Adelfa Koh, NP       alteplase (CATHFLO ACTIVASE) injection 2 mg  2 mg Intracatheter Once PRN Adelfa Koh, NP       amLODipine (NORVASC) tablet 10 mg  10 mg Per Tube Daily Candee Furbish, MD       Chlorhexidine Gluconate Cloth 2 % PADS 6 each  6 each Topical Q0600 Adelfa Koh, NP       clevidipine (CLEVIPREX) infusion 0.5 mg/mL  0-21 mg/hr Intravenous Continuous Jose Persia, MD       docusate (COLACE) 50 MG/5ML liquid 100 mg  100 mg Per Tube BID PRN Jose Persia, MD       docusate (COLACE) 50 MG/5ML liquid 100 mg  100 mg Per Tube BID Jose Persia, MD       fentaNYL (SUBLIMAZE) bolus via infusion 25-100 mcg  25-100 mcg Intravenous Q15 min PRN Noemi Chapel, MD       fentaNYL 2529mg  in NS 279m (149m/ml) infusion-PREMIX  25-200 mcg/hr Intravenous Continuous MiNoemi ChapelMD 15 mL/hr at 07/09/21 1120 150 mcg/hr at 07/09/21 1120   heparin injection 1,000 Units  1,000 Units Dialysis PRN AnAdelfa KohNP       heparin injection 5,000 Units  5,000 Units Subcutaneous Q8H BaJose PersiaMD   5,000 Units at 07/09/21 1125   insulin aspart (novoLOG) injection 0-6 Units  0-6 Units Subcutaneous Q4H BaJose PersiaMD       insulin detemir (LEVEMIR) injection 10 Units  10 Units Subcutaneous QHS BaJose PersiaMD       lidocaine (PF) (XYLOCAINE) 1 % injection 5 mL  5 mL Intradermal PRN AnAdelfa KohNP       lidocaine-prilocaine (EMLA) cream 1 application  1 application Topical PRN AnAdelfa KohNP       pantoprazole (PROTONIX) injection 40 mg  40 mg Intravenous Daily BaJose PersiaMD   40 mg at 07/09/21 1124   pentafluoroprop-tetrafluoroeth (GEBAUERS) aerosol 1 application  1 application Topical PRN AnAdelfa KohNP       polyethylene glycol (MIRALAX / GLYCOLAX) packet 17 g  17 g Per Tube  Daily PRN BaJose PersiaMD       polyethylene glycol (MIRALAX / GLYCOLAX) packet 17 g  17 g Per Tube Daily BaJose PersiaMD       propofol (DIPRIVAN) 1000 MG/100ML infusion  0-50 mcg/kg/min Intravenous Continuous BaJose PersiaMD 22.5 mL/hr at 07/09/21 1058 50 mcg/kg/min at 07/09/21 1058   Labs: Basic Metabolic Panel: Recent Labs  Lab 07/09/21 0750 07/09/21 0804 07/09/21 0856  NA 136 136 136  K 4.0 4.1 3.9  CL 96* 102  --   CO2 25  --   --   GLUCOSE 349* 362*  --   BUN 52* 52*  --   CREATININE 6.78* 6.60*  --   CALCIUM 9.2  --   --    Liver Function Tests: Recent Labs  Lab 07/09/21 0750  AST 22  ALT 16  ALKPHOS 81  BILITOT 0.9  PROT 7.7  ALBUMIN 4.0   No results for input(s): LIPASE, AMYLASE in the last 168 hours. No results for input(s): AMMONIA in the last 168 hours. CBC: Recent Labs  Lab 07/09/21 0750 07/09/21 0804 07/09/21 0856  WBC 12.7*  --   --   HGB 11.5* 12.6 11.9*  HCT 36.3 37.0 35.0*  MCV 92.8  --   --   PLT 311  --   --    Cardiac Enzymes: No results for input(s): CKTOTAL, CKMB, CKMBINDEX, TROPONINI in the last 168 hours. CBG: Recent Labs  Lab 07/09/21 1247  GLUCAP 339*   Iron Studies: No results for input(s): IRON, TIBC, TRANSFERRIN, FERRITIN in the last 72 hours. Studies/Results: DG Chest Port 1 View  Result Date: 07/09/2021 CLINICAL DATA:  Endotracheal tube placement. EXAM: PORTABLE CHEST 1 VIEW COMPARISON:  07/09/2021, earlier same day FINDINGS: 0817 hours. Endotracheal tube tip is 2.3 cm above the base of the carina. The NG tube passes into the stomach although the distal tip position is not included on the film. Right IJ central line tip overlies the region of the SVC/RA junction. Diffuse parahilar airspace disease bilaterally suggests edema. Cardiopericardial silhouette is at upper limits of normal for size. Telemetry leads overlie the chest. IMPRESSION: Endotracheal tube tip is 2.3 cm above the base of the carina. Stable  cardiopulmonary exam. Electronically Signed   By: ErMisty Stanley.D.   On:  07/09/2021 08:42   DG Chest Portable 1 View  Result Date: 07/09/2021 CLINICAL DATA:  Shortness of breath. EXAM: PORTABLE CHEST 1 VIEW COMPARISON:  July 23, 2020. FINDINGS: The heart size and mediastinal contours are within normal limits. Interval development of bilateral reticular and interstitial opacities concerning for pulmonary edema or atypical pneumonia. Right internal jugular catheter is noted with distal tip in expected position of right atrium. The visualized skeletal structures are unremarkable. IMPRESSION: Interval development of bilateral lung opacities concerning for edema or atypical pneumonia. Electronically Signed   By: Marijo Conception M.D.   On: 07/09/2021 08:09   ECHOCARDIOGRAM LIMITED  Result Date: 07/09/2021    ECHOCARDIOGRAM LIMITED REPORT   Patient Name:   Melissa Howe Date of Exam: 07/09/2021 Medical Rec #:  [id number]         Height:       66.0 in Accession #:    [id number]        Weight:       165.3 lb Date of Birth:  [birth year]          BSA:          1.844 m Patient Age:    4 years          BP:           190/97 mmHg Patient Gender: F                 HR:           93 bpm. Exam Location:  Inpatient Procedure: 3D Echo, Limited Echo, Cardiac Doppler and Color Doppler STAT ECHO Indications:    R06.02 SOB  History:        Patient has prior history of Echocardiogram examinations.                 Signs/Symptoms:Dizziness/Lightheadedness, Syncope and                 Bacteremia; Risk Factors:Hypertension, Diabetes and                 Dyslipidemia. Kidney disorder. Flash pulmonary edema.  Sonographer:    Roseanna Rainbow RDCS Referring Phys: [id number] Candee Furbish  Sonographer Comments: Echo performed with patient supine and on artificial respirator and Technically difficult study due to poor echo windows. IMPRESSIONS  1. Global hypokinesis worse in the anteroseptal and mid-apical anterior myocardium. Left ventricular  ejection fraction, by estimation, is 35 to 40%. The left ventricle has moderately decreased function. The left ventricle demonstrates regional wall motion abnormalities (see scoring diagram/findings for description). Left ventricular diastolic parameters are consistent with Grade I diastolic dysfunction (impaired relaxation).  2. Right ventricular systolic function is normal. There is mildly elevated pulmonary artery systolic pressure.  3. The mitral valve is normal in structure. Mild mitral valve regurgitation. No evidence of mitral stenosis.  4. The aortic valve is tricuspid. Aortic valve regurgitation is not visualized. No aortic stenosis is present.  5. The inferior vena cava is dilated in size with <50% respiratory variability, suggesting right atrial pressure of 15 mmHg. Comparison(s): Compared with the echo 12/2019, the echo now shows worse systolic function in the anterior and anterosepal myocardium. Previously, the systolic dysfunction was similar globally. FINDINGS  Left Ventricle: Global hypokinesis worse in the anteroseptal and mid-apical anterior myocardium. Left ventricular ejection fraction, by estimation, is 35 to 40%. The left ventricle has moderately decreased function. The left ventricle demonstrates regional wall motion abnormalities. The left ventricular internal cavity size was normal  in size. There is no left ventricular hypertrophy. Left ventricular diastolic parameters are consistent with Grade I diastolic dysfunction (impaired relaxation). Right Ventricle: No increase in right ventricular wall thickness. Right ventricular systolic function is normal. There is mildly elevated pulmonary artery systolic pressure. The tricuspid regurgitant velocity is 2.37 m/s, and with an assumed right atrial  pressure of 15 mmHg, the estimated right ventricular systolic pressure is [id number] mmHg. Mitral Valve: The mitral valve is normal in structure. Mild mitral annular calcification. Mild mitral valve  regurgitation. No evidence of mitral valve stenosis. Tricuspid Valve: The tricuspid valve is normal in structure. Tricuspid valve regurgitation is trivial. No evidence of tricuspid stenosis. Aortic Valve: The aortic valve is tricuspid. Aortic valve regurgitation is not visualized. No aortic stenosis is present. Pulmonic Valve: The pulmonic valve was normal in structure. Pulmonic valve regurgitation is not visualized. No evidence of pulmonic stenosis. Aorta: The aortic root is normal in size and structure. Venous: The inferior vena cava is dilated in size with less than 50% respiratory variability, suggesting right atrial pressure of 15 mmHg. LEFT VENTRICLE PLAX 2D LVIDd:         3.80 cm LVIDs:         3.20 cm LV PW:         0.92 cm LV IVS:        1.08 cm  LV Volumes (MOD) LV vol d, MOD A2C: 69.0 ml LV vol d, MOD A4C: 67.9 ml LV vol s, MOD A2C: 42.9 ml LV vol s, MOD A4C: 36.6 ml LV SV MOD A2C:     26.1 ml LV SV MOD A4C:     67.9 ml LV SV MOD BP:      27.9 ml IVC IVC diam: 2.10 cm LEFT ATRIUM         Index LA diam:    3.00 cm 1.63 cm/m   AORTA Ao Root diam: 2.90 cm Ao Asc diam:  3.00 cm TRICUSPID VALVE TR Peak grad:   22.5 mmHg TR Vmax:        237.42 cm/s Skeet Latch MD Electronically signed by Skeet Latch MD Signature Date/Time: 07/09/2021/12:12:27 PM    Final     Physical Exam: Vitals:   07/09/21 1130 07/09/21 1133 07/09/21 1136 07/09/21 1139  BP: (!) 172/87 (!) 173/89 (!) 161/86 (!) 183/111  Pulse: 94 97 90 (!) 109  Resp: '20 17 20 19  '$ Temp:      TempSrc:      SpO2: 100% 100% 100% 100%  Weight:      Height:         General: Intubated, sedated, on ventilation Lungs: Coarse/rhonchi anteriorly and laterally; breathing with vent Heart: Tachy, no murmurs, gallops, or rubs Abdomen: soft, nontender, active bowel sounds Lower extremities: trace edema lower extremities; no edema bilateral hips Neuro: Currently sedated Dialysis Access: R tunneled HD catheter and L AVF (+) Bruit/Thrill. Noted AVF  still accessed from outpatient HD center. Spoke to HD RN to evaluate at the initiation of IHD today.   Dialysis Orders:  TTS - [facility]  4hrs, BFR 400, DFR Auto 1.5,  EDW 73.5kg, 2K/ 2Ca  Heparin 2000 bolus with HD Mircera 75 mcg q4wks - last 06/20/21 Calcitriol 45mg PO qHD-last 07/06/21 Venofer '50mg'$  IV weekly-last 07/04/21 Sensipar '90mg'$  with HD-last 07/06/21  Last Labs: Hgb 11.9, K 3.9, Ca 9.2, Alb 4.0  Assessment/Plan: Acute Respiratory Distress/Pulmonary Edema-flash pulmonary edema in setting hypertensive emergency. Plan for emergent IHD at bedside. Continue vent support. Hypertensive  Emergency-etiology unknown: cardiogenic vs. Ischemic. Vs infection. Currently afebrile, blood cxs drawn, troponin pending, on Cleviprex drip ESRD - on HD TTS. Patient admitted to ICU. Plan for emergent IHD at bedside. Hypertension/volume  - patient very hypertensive, CXR shows BL pulmonary edema, trace edema LE noted. Continue vent support, plan for IHD today. Anemia of CKD - Hgb 11.9-no Fe/ESA indicated at this time-will monitor trends Secondary Hyperparathyroidism -  Ca ok, will order PO4 in AM-monitor trends. Nutrition - Patient intubated  Tobie Poet, NP [facility] 07/09/2021, 1:52 PM     Seen and examined independently.  Agree with note and exam as documented above by physician extender and as noted here.  Seen this am on consult.   Patient with ESRD on HD TTS at [facility], DM, HFrEF, and HTN presented with shortness of breath.  She went to HD but wasn't initiated.  Needles apparently left in and she was sent to the ER with shortness of breath.  She was intubated for resp distress.  She was alert prior to intubation per nursing. She was started on sedation with plans for nitro gtt per nursing.  Later apparently clividipine gtt ordered. This was later held.  She was admitted to MICU   General elderly female intubated HEENT normocephalic atraumatic Neck trachea  midline Lungs coarse mechanical breath sounds Heart S1S2 no rub Abdomen soft nontender nondistended Extremities no to trace edema  Neuro - sedation running on my exam this am Access: left AVF is cannulated per my am exam - see above; RIJ tunn catheter  Acute hypoxic resp failure - optimize volume with HD  ESRD  - HD today and per TTS schedule  HTN emergency - Hypotension in the interim with attempted UF; we are reducing UF goal and giving albumin to try and get off what she will tolerate  Anemia CKD  - defer ESA at this time  Secondary hyperpara - trend phos; note on sensipar outpt with HD as well as calcitriol  Claudia Desanctis, MD 07/09/2021 3:54 PM
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
## 2021-07-09 NOTE — Progress Notes (Signed)
  Echocardiogram 2D Echocardiogram has been performed.  Melissa Howe 07/09/2021, 11:06 AM

## 2021-07-09 NOTE — Progress Notes (Signed)
Pt noted with SBP running in 60s, primary nurse at bedside. UF stopped at present time. Dr. Royce Macadamia notified, received order for albumin 25g IV x1, attempt to pull fluid once BP stabilizes, and based on pt tolerance. Primary nurse made aware and is contacting the critical care MD as well.

## 2021-07-09 NOTE — ED Notes (Signed)
ICU provider at bedside

## 2021-07-09 NOTE — Progress Notes (Signed)
Pt noted with significant drop in SBP from 170s-110s. Primary nurse at bedside. Dr. Royce Macadamia made aware, received order to decrease UF goal to 3 liters and keep SBP 100 or above. Continue to attempt to pull fluid as long as pt can tolerate. Primary nurse made aware.

## 2021-07-10 DIAGNOSIS — I5021 Acute systolic (congestive) heart failure: Secondary | ICD-10-CM

## 2021-07-10 DIAGNOSIS — I161 Hypertensive emergency: Secondary | ICD-10-CM | POA: Diagnosis not present

## 2021-07-10 LAB — CBC
HCT: 27.2 % — ABNORMAL LOW (ref 36.0–46.0)
Hemoglobin: 9.3 g/dL — ABNORMAL LOW (ref 12.0–15.0)
MCH: 29.6 pg (ref 26.0–34.0)
MCHC: 34.2 g/dL (ref 30.0–36.0)
MCV: 86.6 fL (ref 80.0–100.0)
Platelets: 227 10*3/uL (ref 150–400)
RBC: 3.14 MIL/uL — ABNORMAL LOW (ref 3.87–5.11)
RDW: 15.2 % (ref 11.5–15.5)
WBC: 7 10*3/uL (ref 4.0–10.5)
nRBC: 0 % (ref 0.0–0.2)

## 2021-07-10 LAB — GLUCOSE, CAPILLARY
Glucose-Capillary: 118 mg/dL — ABNORMAL HIGH (ref 70–99)
Glucose-Capillary: 139 mg/dL — ABNORMAL HIGH (ref 70–99)
Glucose-Capillary: 144 mg/dL — ABNORMAL HIGH (ref 70–99)
Glucose-Capillary: 168 mg/dL — ABNORMAL HIGH (ref 70–99)
Glucose-Capillary: 263 mg/dL — ABNORMAL HIGH (ref 70–99)
Glucose-Capillary: 81 mg/dL (ref 70–99)
Glucose-Capillary: 99 mg/dL (ref 70–99)

## 2021-07-10 LAB — HEPATITIS B SURFACE ANTIBODY, QUANTITATIVE: Hep B S AB Quant (Post): 241.1 m[IU]/mL (ref >9.9)

## 2021-07-10 LAB — RENAL FUNCTION PANEL
Albumin: 3.1 g/dL — ABNORMAL LOW (ref 3.5–5.0)
Anion gap: 13 (ref 5–15)
BUN: 21 mg/dL (ref 8–23)
CO2: 25 mmol/L (ref 22–32)
Calcium: 8.2 mg/dL — ABNORMAL LOW (ref 8.9–10.3)
Chloride: 95 mmol/L — ABNORMAL LOW (ref 98–111)
Creatinine, Ser: 4.25 mg/dL — ABNORMAL HIGH (ref 0.44–1.00)
GFR, Estimated: 11 mL/min — ABNORMAL LOW (ref >60)
Glucose, Bld: 98 mg/dL (ref 70–99)
Phosphorus: 2.8 mg/dL (ref 2.5–4.6)
Potassium: 3.1 mmol/L — ABNORMAL LOW (ref 3.5–5.1)
Sodium: 133 mmol/L — ABNORMAL LOW (ref 135–145)

## 2021-07-10 LAB — LIPID PANEL
Cholesterol: 209 mg/dL — ABNORMAL HIGH (ref 0–200)
HDL: 41 mg/dL (ref >40)
LDL Cholesterol: 145 mg/dL — ABNORMAL HIGH (ref 0–99)
Total CHOL/HDL Ratio: 5.1 RATIO
Triglycerides: 115 mg/dL (ref <150)
VLDL: 23 mg/dL (ref 0–40)

## 2021-07-10 LAB — TSH: TSH: 2.367 u[IU]/mL (ref 0.350–4.500)

## 2021-07-10 LAB — HEPARIN LEVEL (UNFRACTIONATED)
Heparin Unfractionated: 0.31 IU/mL (ref 0.30–0.70)
Heparin Unfractionated: 0.42 IU/mL (ref 0.30–0.70)

## 2021-07-10 LAB — LACTIC ACID, PLASMA: Lactic Acid, Venous: 1 mmol/L (ref 0.5–1.9)

## 2021-07-10 LAB — TROPONIN I (HIGH SENSITIVITY): Troponin I (High Sensitivity): 711 ng/L (ref <18)

## 2021-07-10 LAB — TRIGLYCERIDES: Triglycerides: 120 mg/dL (ref <150)

## 2021-07-10 MED ORDER — FUROSEMIDE 10 MG/ML IJ SOLN
80.0000 mg | Freq: Once | INTRAMUSCULAR | Status: AC
  Administered 2021-07-10: 80 mg via INTRAVENOUS
  Filled 2021-07-10: qty 8

## 2021-07-10 MED ORDER — CALCITRIOL 0.25 MCG PO CAPS
2.0000 ug | ORAL_CAPSULE | ORAL | Status: DC
  Filled 2021-07-10: qty 8

## 2021-07-10 MED ORDER — CARVEDILOL 6.25 MG PO TABS: 6.2500 mg | ORAL_TABLET | Freq: Two times a day (BID) | ORAL | Status: DC

## 2021-07-10 MED ORDER — ACETAMINOPHEN 325 MG PO TABS: 650.0000 mg | ORAL_TABLET | ORAL | Status: DC | PRN

## 2021-07-10 MED ORDER — DOCUSATE SODIUM 100 MG PO CAPS: 100.0000 mg | ORAL_CAPSULE | Freq: Two times a day (BID) | ORAL | Status: DC | PRN

## 2021-07-10 MED ORDER — ASPIRIN 81 MG PO CHEW
81.0000 mg | CHEWABLE_TABLET | Freq: Every day | ORAL | Status: DC
  Administered 2021-07-11: 81 mg via ORAL
  Filled 2021-07-10: qty 1

## 2021-07-10 MED ORDER — POLYETHYLENE GLYCOL 3350 17 G PO PACK
17.0000 g | PACK | Freq: Every day | ORAL | Status: DC
  Administered 2021-07-11 – 2021-07-13 (×2): 17 g via ORAL
  Filled 2021-07-10 (×2): qty 1

## 2021-07-10 MED ORDER — CARVEDILOL 6.25 MG PO TABS
6.2500 mg | ORAL_TABLET | Freq: Two times a day (BID) | ORAL | Status: DC
  Administered 2021-07-10: 6.25 mg via ORAL
  Filled 2021-07-10: qty 1

## 2021-07-10 MED ORDER — DOCUSATE SODIUM 100 MG PO CAPS
100.0000 mg | ORAL_CAPSULE | Freq: Two times a day (BID) | ORAL | Status: DC
  Administered 2021-07-10 – 2021-07-13 (×5): 100 mg via ORAL
  Filled 2021-07-10 (×6): qty 1

## 2021-07-10 MED ORDER — POTASSIUM CHLORIDE 20 MEQ PO PACK
20.0000 meq | PACK | Freq: Once | ORAL | Status: AC
  Administered 2021-07-10: 20 meq
  Filled 2021-07-10: qty 1

## 2021-07-10 MED ORDER — CHLORHEXIDINE GLUCONATE CLOTH 2 % EX PADS: 6.0000 | MEDICATED_PAD | Freq: Every day | CUTANEOUS | Status: DC

## 2021-07-10 MED ORDER — CINACALCET HCL 30 MG PO TABS
90.0000 mg | ORAL_TABLET | ORAL | Status: DC
  Administered 2021-07-11: 90 mg via ORAL
  Filled 2021-07-10 (×2): qty 3

## 2021-07-10 MED ORDER — ATORVASTATIN CALCIUM 80 MG PO TABS
80.0000 mg | ORAL_TABLET | Freq: Every day | ORAL | Status: DC
  Administered 2021-07-11 – 2021-07-13 (×2): 80 mg via ORAL
  Filled 2021-07-10 (×2): qty 1

## 2021-07-10 MED ORDER — POLYETHYLENE GLYCOL 3350 17 G PO PACK: 17.0000 g | PACK | Freq: Every day | ORAL | Status: DC | PRN

## 2021-07-10 MED ORDER — CARVEDILOL 6.25 MG PO TABS
6.2500 mg | ORAL_TABLET | Freq: Two times a day (BID) | ORAL | Status: DC
  Administered 2021-07-10 – 2021-07-12 (×4): 6.25 mg via ORAL
  Filled 2021-07-10 (×4): qty 1

## 2021-07-10 MED ORDER — CHLORHEXIDINE GLUCONATE CLOTH 2 % EX PADS
6.0000 | MEDICATED_PAD | Freq: Every day | CUTANEOUS | Status: DC
  Administered 2021-07-10 – 2021-07-11 (×2): 6 via TOPICAL

## 2021-07-10 MED ORDER — CALCITRIOL 0.5 MCG PO CAPS
2.0000 ug | ORAL_CAPSULE | ORAL | Status: DC
  Administered 2021-07-11 – 2021-07-13 (×2): 2 ug via ORAL
  Filled 2021-07-10: qty 8
  Filled 2021-07-10: qty 4

## 2021-07-10 NOTE — Progress Notes (Signed)
[facility] Progress Note Patient Name: [name] DOB: [birth year] MRN: [id number]   Date of Service  07/10/2021  HPI/Events of Note  Patient has ESRD and is on dialysis, apparently has made some urine during this shift but it has dropped off.  eICU Interventions  Lasix 80 mg iv x 1 ordered but it is likely he will require dialysis to remove significant amounts of fluid.        Kerry Kass Homer Pfeifer 07/10/2021, 6:13 AM

## 2021-07-10 NOTE — Progress Notes (Signed)
ANTICOAGULATION CONSULT NOTE  Pharmacy Consult for heparin Indication: chest pain/ACS  No Known Allergies  Patient Measurements: Height: '5\' 6"'$  (167.6 cm) Weight: 73.9 kg (162 lb 14.7 oz) IBW/kg (Calculated) : 59.3 Heparin Dosing Weight: 75kg  Vital Signs: Temp: 98 F (36.7 C) (09/14 1200) Temp Source: Oral (09/14 1200) BP: 138/68 (09/14 1300) Pulse Rate: 80 (09/14 1300)  Labs: Recent Labs    07/09/21 0750 07/09/21 0804 07/09/21 0856 07/09/21 1317 07/09/21 1900 07/09/21 2136 07/10/21 0007 07/10/21 0506 07/10/21 1329  HGB 11.5* 12.6 11.9*  --   --   --   --  9.3*  --   HCT 36.3 37.0 35.0*  --   --   --   --  27.2*  --   PLT 311  --   --   --   --   --   --  227  --   APTT  --   --  24  --   --   --   --   --   --   LABPROT 12.9  --   --   --   --   --   --   --   --   INR 1.0  --   --   --   --   --   --   --   --   HEPARINUNFRC  --   --   --   --   --   --  0.42  --  0.31  CREATININE 6.78* 6.60*  --   --   --   --   --  4.25*  --   TROPONINIHS  --   --   --    < > 859* 804*  --  711*  --    < > = values in this interval not displayed.    Estimated Creatinine Clearance: 12.5 mL/min (A) (by C-G formula based on SCr of 4.25 mg/dL (H)).   Medical History: Past Medical History:  Diagnosis Date   Anemia    Chronic kidney disease    Constipation    Diabetes mellitus    Type II   Dyspnea    with exertion   History of blood transfusion    Hyperlipemia    Hypertension    Pneumonia 2019    Assessment: 57 yoF with ESRD on HD admitted with hypertensive urgency. Trops bumped likely due to demand, covering with heparin for possible ACS for now. CBC wnl, no AC PTA.   Heparin level at goal (trending down to low end) on 900 units/hr. No bleeding or IV issues noted.   Goal of Therapy:  Heparin level 0.3-0.7 units/ml Monitor platelets by anticoagulation protocol: Yes   Plan:  Increase Heparin to 950 units/hr (9.5 ml/hr) to keep in goal.  Daily heparin level and  CBC  Sloan Leiter, PharmD, BCPS, BCCCP Clinical Pharmacist Please refer to [facility] for [place] numbers 07/10/2021 2:17 PM

## 2021-07-10 NOTE — Progress Notes (Signed)
ANTICOAGULATION CONSULT NOTE  Pharmacy Consult for heparin Indication: chest pain/ACS  No Known Allergies  Patient Measurements: Height: '5\' 6"'$  (167.6 cm) Weight: 73.9 kg (162 lb 14.7 oz) IBW/kg (Calculated) : 59.3 Heparin Dosing Weight: 75kg  Vital Signs: Temp: 94.2 F (34.6 C) (09/14 0021) Temp Source: Axillary (09/14 0021) BP: 110/64 (09/14 0100) Pulse Rate: 68 (09/14 0100)  Labs: Recent Labs    07/09/21 0750 07/09/21 0804 07/09/21 0856 07/09/21 1317 07/09/21 1450 07/09/21 1900 07/09/21 2136 07/10/21 0007  HGB 11.5* 12.6 11.9*  --   --   --   --   --   HCT 36.3 37.0 35.0*  --   --   --   --   --   PLT 311  --   --   --   --   --   --   --   APTT  --   --  24  --   --   --   --   --   LABPROT 12.9  --   --   --   --   --   --   --   INR 1.0  --   --   --   --   --   --   --   HEPARINUNFRC  --   --   --   --   --   --   --  0.42  CREATININE 6.78* 6.60*  --   --   --   --   --   --   TROPONINIHS  --   --   --    < > 588* 859* 804*  --    < > = values in this interval not displayed.     Estimated Creatinine Clearance: 8 mL/min (A) (by C-G formula based on SCr of 6.6 mg/dL (H)).   Medical History: Past Medical History:  Diagnosis Date   Anemia    Chronic kidney disease    Constipation    Diabetes mellitus    Type II   Dyspnea    with exertion   History of blood transfusion    Hyperlipemia    Hypertension    Pneumonia 2019    Assessment: 60 yoF with ESRD on HD admitted with hypertensive urgency. Trops bumped likely due to demand, covering with heparin for possible ACS for now. CBC wnl, no AC PTA.   Heparin level at goal this morning on 900 units/hr. No bleeding or IV issues noted.   Goal of Therapy:  Heparin level 0.3-0.7 units/ml Monitor platelets by anticoagulation protocol: Yes   Plan:  Heparin 900 units/h Daily heparin level and CBC  Erin Hearing PharmD., BCPS Clinical Pharmacist 07/10/2021 1:12 AM

## 2021-07-10 NOTE — Evaluation (Signed)
Clinical/Bedside Swallow Evaluation Patient Details  Name: Melissa Howe MRN: [id number] Date of Birth: Mar 16, 1951  Today's Date: 07/10/2021 Time: SLP Start Time (ACUTE ONLY): 18 SLP Stop Time (ACUTE ONLY): 1320 SLP Time Calculation (min) (ACUTE ONLY): 20 min  Past Medical History:  Past Medical History:  Diagnosis Date   Anemia    Chronic kidney disease    Constipation    Diabetes mellitus    Type II   Dyspnea    with exertion   History of blood transfusion    Hyperlipemia    Hypertension    Pneumonia 2019   Past Surgical History:  Past Surgical History:  Procedure Laterality Date   APPENDECTOMY     AV FISTULA PLACEMENT Left 10/05/2019   Procedure: INSERTION OF ARTERIOVENOUS (AV) GORE-TEX VASCULAR GRAFT LEFT ARM;  Surgeon: [name], MD;  Location: [place];  Service: Vascular;  Laterality: Left;   AV FISTULA PLACEMENT Left 02/27/2021   Procedure: LEFT ARM FIRST STAGE BASILIC VEIN  ARTERIOVENOUS (AV) FISTULA CREATION;  Surgeon: Waynetta Sandy, MD;  Location: [place];  Service: Vascular;  Laterality: Left;   BASCILIC VEIN TRANSPOSITION Left 04/17/2021   Procedure: LEFT SECOND STAGE [place];  Surgeon: Waynetta Sandy, MD;  Location: [place];  Service: Vascular;  Laterality: Left;   BIOPSY  07/24/2020   Procedure: BIOPSY;  Surgeon: Doran Stabler, MD;  Location: [place];  Service: Gastroenterology;;   BUBBLE STUDY  01/02/2020   Procedure: BUBBLE STUDY;  Surgeon: Elouise Munroe, MD;  Location: [facility] ENDOSCOPY;  Service: Cardiology;;   COLONOSCOPY WITH PROPOFOL N/A 07/24/2020   Procedure: COLONOSCOPY WITH PROPOFOL;  Surgeon: Doran Stabler, MD;  Location: [place];  Service: Gastroenterology;  Laterality: N/A;   ESOPHAGOGASTRODUODENOSCOPY (EGD) WITH PROPOFOL N/A 07/24/2020   Procedure: ESOPHAGOGASTRODUODENOSCOPY (EGD) WITH PROPOFOL;  Surgeon: Doran Stabler, MD;  Location: [place];  Service: Gastroenterology;  Laterality:  N/A;   EYE SURGERY Bilateral    Cataract   FOREIGN BODY REMOVAL Left 05/13/2018   Procedure: FOREIGN BODY REMOVAL left foot;  Surgeon: Meredith Pel, MD;  Location: [place];  Service: Orthopedics;  Laterality: Left;   I & D EXTREMITY Left 05/21/2018   Procedure: LEFT FOOT DEBRIDEMENT AND WOUND CLOSURE;  Surgeon: Newt Minion, MD;  Location: [place];  Service: Orthopedics;  Laterality: Left;   POLYPECTOMY  07/24/2020   Procedure: POLYPECTOMY;  Surgeon: Doran Stabler, MD;  Location: [place];  Service: Gastroenterology;;   TEE WITHOUT CARDIOVERSION N/A 01/02/2020   Procedure: TRANSESOPHAGEAL ECHOCARDIOGRAM (TEE);  Surgeon: Elouise Munroe, MD;  Location: [place];  Service: Cardiology;  Laterality: N/A;   TONSILLECTOMY     TUBAL LIGATION     HPI:  70yo female admitted 07/09/21 with SOB. PMH: ESRD on HD TTS, anemia, HTN, HLD, DM2   Assessment / Plan / Recommendation Clinical Impression  Pt presents with functional swallow at bedside. She reports no history of dysphagia, and was intubated overnight. CN exam is unremarkable, cough is strong, and voice quality is clear. Pt is edentulous. She accepted trials of thin liquid, puree, and solid textures. Timely oral prep and clearing. No overt s/s aspiration on any consistency. Recommend regular diet/thin liquids, meds whole with liquid. No further ST intervention recommended at this time. Please reconsult if needs arise.  SLP Visit Diagnosis: Dysphagia, unspecified (R13.10)    Aspiration Risk  Mild aspiration risk    Diet Recommendation Regular;Thin liquid   Liquid  Administration via: Cup;Straw Medication Administration: Whole meds with liquid Supervision: Patient able to self feed Compensations: Slow rate;Small sips/bites Postural Changes: Seated upright at 90 degrees    Other  Recommendations Oral Care Recommendations: Oral care BID   Follow up Recommendations None          Prognosis   good     Swallow Study    General Date of Onset: 07/09/21 HPI: 70yo female admitted 07/09/21 with SOB. PMH: ESRD on HD TTS, anemia, HTN, HLD, DM2 Type of Study: Bedside Swallow Evaluation Previous Swallow Assessment: none Diet Prior to this Study: NPO Temperature Spikes Noted: No Respiratory Status: Nasal cannula History of Recent Intubation: Yes Length of Intubations (days): 1 days Date extubated: 07/10/21 Behavior/Cognition: Alert;Cooperative;Pleasant mood Oral Cavity Assessment: Within Functional Limits Oral Care Completed by SLP: Yes Oral Cavity - Dentition: Edentulous Vision: Functional for self-feeding Self-Feeding Abilities: Able to feed self Patient Positioning: Upright in bed Baseline Vocal Quality: Normal Volitional Cough: Strong Volitional Swallow: Able to elicit    Oral/Motor/Sensory Function Overall Oral Motor/Sensory Function: Within functional limits   Ice Chips Ice chips: Within functional limits Presentation: Spoon   Thin Liquid Thin Liquid: Within functional limits Presentation: Cup;Self Fed;Straw    Nectar Thick Nectar Thick Liquid: Not tested   Honey Thick Honey Thick Liquid: Not tested   Puree Puree: Within functional limits Presentation: Self Fed;Spoon   Solid     Solid: Within functional limits Presentation: Ely B. Quentin Ore, [facility], [place] Speech Language Pathologist Office: [phone]  Shonna Chock 07/10/2021,1:23 PM

## 2021-07-10 NOTE — H&P (View-Only) (Signed)
Cardiology Progress Note  Patient ID: Melissa Howe MRN: [id number] DOB: 1951-05-30 Date of Encounter: 07/10/2021  Primary Cardiologist: None  Subjective   Chief Complaint: None.  Intubated.  HPI: Intubated on the vent.  Good volume removal yesterday.  On pressure support.  Critical care medicine expects extubation today.  ROS:  All other ROS reviewed and negative. Pertinent positives noted in the HPI.     Inpatient Medications  Scheduled Meds:  aspirin  81 mg Per Tube Daily   atorvastatin  80 mg Per Tube Daily   [START ON 07/11/2021] calcitRIOL  2 mcg Per Tube Q T,Th,Sa-HD   chlorhexidine gluconate (MEDLINE KIT)  15 mL Mouth Rinse BID   [START ON 07/11/2021] Chlorhexidine Gluconate Cloth  6 each Topical Daily   [START ON 07/11/2021] cinacalcet  90 mg Oral Q T,Th,Sa-HD   docusate  100 mg Per Tube BID   insulin aspart  0-6 Units Subcutaneous Q4H   insulin detemir  10 Units Subcutaneous QHS   mouth rinse  15 mL Mouth Rinse 10 times per day   pantoprazole  40 mg Intravenous Daily   polyethylene glycol  17 g Per Tube Daily   potassium chloride  20 mEq Per Tube Once   sodium chloride flush  10-40 mL Intracatheter Q12H   Continuous Infusions:  sodium chloride     sodium chloride     sodium chloride 5 mL/hr at 07/10/21 0300   ceFEPime (MAXIPIME) IV     clevidipine Stopped (07/09/21 1300)   fentaNYL infusion INTRAVENOUS 150 mcg/hr (07/09/21 1120)   heparin 900 Units/hr (07/10/21 0300)   norepinephrine (LEVOPHED) Adult infusion Stopped (07/09/21 1843)   propofol (DIPRIVAN) infusion 15 mcg/kg/min (07/10/21 0300)   vancomycin     PRN Meds: sodium chloride, sodium chloride, sodium chloride, alteplase, docusate, fentaNYL, heparin, lidocaine (PF), lidocaine-prilocaine, pentafluoroprop-tetrafluoroeth, polyethylene glycol, sodium chloride flush   Vital Signs   Vitals:   07/10/21 0630 07/10/21 0645 07/10/21 0700 07/10/21 0739  BP: (!) 151/68 (!) 152/73 (!) 148/76   Pulse: 73 75 68  76  Resp: 20 (!) _0 Temp:      TempSrc:      SpO2: 100% 100% 100% 100%  Weight:      Height:        Intake/Output Summary (Last 24 hours) at 07/10/2021 0842 Last data filed at 07/10/2021 0455 Gross per 24 hour  Intake 508.19 ml  Output 1380 ml  Net -871.81 ml   Last 3 Weights 07/09/2021 07/09/2021 07/09/2021  Weight (lbs) 162 lb 14.7 oz 165 lb 5.5 oz 165 lb 5.5 oz  Weight (kg) 73.9 kg 75 kg 75 kg      Telemetry  Overnight telemetry shows sinus rhythm in the 70s, which I personally reviewed.   ECG  The most recent ECG shows normal sinus rhythm, minimal ST elevation in V2 and V3, anterolateral T wave inversions, which I personally reviewed.   Physical Exam   Vitals:   07/10/21 0630 07/10/21 0645 07/10/21 0700 07/10/21 0739  BP: (!) 151/68 (!) 152/73 (!) 148/76   Pulse: 73 75 68 76  Resp: 20 (!) _1 Temp:      TempSrc:      SpO2: 100% 100% 100% 100%  Weight:      Height:        Intake/Output Summary (Last 24 hours) at 07/10/2021 0842 Last data filed at 07/10/2021 0455 Gross per 24 hour  Intake 508.19 ml  Output 1380 ml  Net -871.81 ml    Last 3 Weights 07/09/2021 07/09/2021 07/09/2021  Weight (lbs) 162 lb 14.7 oz 165 lb 5.5 oz 165 lb 5.5 oz  Weight (kg) 73.9 kg 75 kg 75 kg    Body mass index is 26.3 kg/m.   General: Intubated on the vent Head: Atraumatic, normal size  Eyes: PEERLA, EOMI  Neck: Supple, no JVD Endocrine: No thryomegaly Cardiac: Normal S1, S2; RRR; no murmurs, rubs, or gallops Lungs: Diminished breath sounds, improved from yesterday Abd: Soft, nontender, no hepatomegaly  Ext: No edema, pulses 2+ Musculoskeletal: No deformities Skin: Warm and dry, no rashes   Neuro: Awake and alert on the ventilator  Labs  High Sensitivity Troponin:   Recent Labs  Lab 07/09/21 1317 07/09/21 1450 07/09/21 1900 07/09/21 2136 07/10/21 0506  TROPONINIHS 531* 588* 859* 804* 711*     Cardiac EnzymesNo results for input(s): TROPONINI in the last 168  hours. No results for input(s): TROPIPOC in the last 168 hours.  Chemistry Recent Labs  Lab 07/09/21 0750 07/09/21 0804 07/09/21 0856 07/10/21 0506  NA 136 136 136 133*  K 4.0 4.1 3.9 3.1*  CL 96* 102  --  95*  CO2 25  --   --  25  GLUCOSE 349* 362*  --  98  BUN 52* 52*  --  21  CREATININE 6.78* 6.60*  --  4.25*  CALCIUM 9.2  --   --  8.2*  PROT 7.7  --   --   --   ALBUMIN 4.0  --   --  3.1*  AST 22  --   --   --   ALT 16  --   --   --   ALKPHOS 81  --   --   --   BILITOT 0.9  --   --   --   GFRNONAA 6*  --   --  11*  ANIONGAP 15  --   --  13    Hematology Recent Labs  Lab 07/09/21 0750 07/09/21 0804 07/09/21 0856 07/10/21 0506  WBC 12.7*  --   --  7.0  RBC 3.91  --   --  3.14*  HGB 11.5* 12.6 11.9* 9.3*  HCT 36.3 37.0 35.0* 27.2*  MCV 92.8  --   --  86.6  MCH 29.4  --   --  29.6  MCHC 31.7  --   --  34.2  RDW 15.0  --   --  15.2  PLT 311  --   --  227   BNP Recent Labs  Lab 07/09/21 1450  BNP 3,314.8*    DDimer No results for input(s): DDIMER in the last 168 hours.   Radiology  DG Chest Port 1 View  Result Date: 07/09/2021 CLINICAL DATA:  Endotracheal tube placement. EXAM: PORTABLE CHEST 1 VIEW COMPARISON:  07/09/2021, earlier same day FINDINGS: 0817 hours. Endotracheal tube tip is 2.3 cm above the base of the carina. The NG tube passes into the stomach although the distal tip position is not included on the film. Right IJ central line tip overlies the region of the SVC/RA junction. Diffuse parahilar airspace disease bilaterally suggests edema. Cardiopericardial silhouette is at upper limits of normal for size. Telemetry leads overlie the chest. IMPRESSION: Endotracheal tube tip is 2.3 cm above the base of the carina. Stable cardiopulmonary exam. Electronically Signed   By: Misty Stanley M.D.   On: 07/09/2021 08:42   DG Chest Portable 1 View  Result Date: 07/09/2021 CLINICAL  DATA:  Shortness of breath. EXAM: PORTABLE CHEST 1 VIEW COMPARISON:  July 23, 2020. FINDINGS: The heart size and mediastinal contours are within normal limits. Interval development of bilateral reticular and interstitial opacities concerning for pulmonary edema or atypical pneumonia. Right internal jugular catheter is noted with distal tip in expected position of right atrium. The visualized skeletal structures are unremarkable. IMPRESSION: Interval development of bilateral lung opacities concerning for edema or atypical pneumonia. Electronically Signed   By: Marijo Conception M.D.   On: 07/09/2021 08:09   DG Abd Portable 1V  Result Date: 07/09/2021 CLINICAL DATA:  Nasogastric tube placement. EXAM: PORTABLE ABDOMEN - 1 VIEW COMPARISON:  Chest x-ray 07/09/2021. FINDINGS: Nasogastric tube tip terminates over the mid stomach. Central venous catheter tip projects over the distal SVC, unchanged. There are no dilated bowel loops in the upper abdomen. There is left basilar atelectasis/airspace disease. IMPRESSION: Nasogastric tube tip projects over the mid stomach. Electronically Signed   By: Ronney Asters M.D.   On: 07/09/2021 19:25   ECHOCARDIOGRAM LIMITED  Result Date: 07/09/2021    ECHOCARDIOGRAM LIMITED REPORT   Patient Name:   CAMBRIE SONNENFELD Date of Exam: 07/09/2021 Medical Rec #:  [id number]         Height:       66.0 in Accession #:    [id number]        Weight:       165.3 lb Date of Birth:  [birth year]          BSA:          1.844 m Patient Age:    69 years          BP:           190/97 mmHg Patient Gender: F                 HR:           93 bpm. Exam Location:  Inpatient Procedure: 3D Echo, Limited Echo, Cardiac Doppler and Color Doppler STAT ECHO Indications:    R06.02 SOB  History:        Patient has prior history of Echocardiogram examinations.                 Signs/Symptoms:Dizziness/Lightheadedness, Syncope and                 Bacteremia; Risk Factors:Hypertension, Diabetes and                 Dyslipidemia. Kidney disorder. Flash pulmonary edema.  Sonographer:    Roseanna Rainbow RDCS  Referring Phys: [id number] Candee Furbish  Sonographer Comments: Echo performed with patient supine and on artificial respirator and Technically difficult study due to poor echo windows. IMPRESSIONS  1. Global hypokinesis worse in the anteroseptal and mid-apical anterior myocardium. Left ventricular ejection fraction, by estimation, is 35 to 40%. The left ventricle has moderately decreased function. The left ventricle demonstrates regional wall motion abnormalities (see scoring diagram/findings for description). Left ventricular diastolic parameters are consistent with Grade I diastolic dysfunction (impaired relaxation).  2. Right ventricular systolic function is normal. There is mildly elevated pulmonary artery systolic pressure.  3. The mitral valve is normal in structure. Mild mitral valve regurgitation. No evidence of mitral stenosis.  4. The aortic valve is tricuspid. Aortic valve regurgitation is not visualized. No aortic stenosis is present.  5. The inferior vena cava is dilated in size with <50% respiratory variability, suggesting right atrial pressure of 15 mmHg. Comparison(s):  Compared with the echo 12/2019, the echo now shows worse systolic function in the anterior and anterosepal myocardium. Previously, the systolic dysfunction was similar globally. FINDINGS  Left Ventricle: Global hypokinesis worse in the anteroseptal and mid-apical anterior myocardium. Left ventricular ejection fraction, by estimation, is 35 to 40%. The left ventricle has moderately decreased function. The left ventricle demonstrates regional wall motion abnormalities. The left ventricular internal cavity size was normal in size. There is no left ventricular hypertrophy. Left ventricular diastolic parameters are consistent with Grade I diastolic dysfunction (impaired relaxation). Right Ventricle: No increase in right ventricular wall thickness. Right ventricular systolic function is normal. There is mildly elevated pulmonary artery  systolic pressure. The tricuspid regurgitant velocity is 2.37 m/s, and with an assumed right atrial  pressure of 15 mmHg, the estimated right ventricular systolic pressure is 62.3 mmHg. Mitral Valve: The mitral valve is normal in structure. Mild mitral annular calcification. Mild mitral valve regurgitation. No evidence of mitral valve stenosis. Tricuspid Valve: The tricuspid valve is normal in structure. Tricuspid valve regurgitation is trivial. No evidence of tricuspid stenosis. Aortic Valve: The aortic valve is tricuspid. Aortic valve regurgitation is not visualized. No aortic stenosis is present. Pulmonic Valve: The pulmonic valve was normal in structure. Pulmonic valve regurgitation is not visualized. No evidence of pulmonic stenosis. Aorta: The aortic root is normal in size and structure. Venous: The inferior vena cava is dilated in size with less than 50% respiratory variability, suggesting right atrial pressure of 15 mmHg. LEFT VENTRICLE PLAX 2D LVIDd:         3.80 cm LVIDs:         3.20 cm LV PW:         0.92 cm LV IVS:        1.08 cm  LV Volumes (MOD) LV vol d, MOD A2C: 69.0 ml LV vol d, MOD A4C: 67.9 ml LV vol s, MOD A2C: 42.9 ml LV vol s, MOD A4C: 36.6 ml LV SV MOD A2C:     26.1 ml LV SV MOD A4C:     67.9 ml LV SV MOD BP:      27.9 ml IVC IVC diam: 2.10 cm LEFT ATRIUM         Index LA diam:    3.00 cm 1.63 cm/m   AORTA Ao Root diam: 2.90 cm Ao Asc diam:  3.00 cm TRICUSPID VALVE TR Peak grad:   22.5 mmHg TR Vmax:        237.42 cm/s Skeet Latch MD Electronically signed by Skeet Latch MD Signature Date/Time: 07/09/2021/12:12:27 PM    Final     Cardiac Studies  TTE 07/09/2021  1. Global hypokinesis worse in the anteroseptal and mid-apical anterior  myocardium. Left ventricular ejection fraction, by estimation, is 35 to  40%. The left ventricle has moderately decreased function. The left  ventricle demonstrates regional wall  motion abnormalities (see scoring diagram/findings for description).  Left  ventricular diastolic parameters are consistent with Grade I diastolic  dysfunction (impaired relaxation).   2. Right ventricular systolic function is normal. There is mildly  elevated pulmonary artery systolic pressure.   3. The mitral valve is normal in structure. Mild mitral valve  regurgitation. No evidence of mitral stenosis.   4. The aortic valve is tricuspid. Aortic valve regurgitation is not  visualized. No aortic stenosis is present.   5. The inferior vena cava is dilated in size with <50% respiratory  variability, suggesting right atrial pressure of 15 mmHg.   Patient Profile  Melissa Howe is a 70 y.o. female with diabetes, ESRD on hemodialysis, hypertension who was admitted on 07/09/2021 with acute epoxy respiratory failure requiring intubation secondary to pulmonary edema.  Cardiology was consulted due to abnormal EKG and troponin elevation.  Also found to have newly reduced EF 35 to 40%.  Assessment & Plan   #Troponin elevation, demand ischemia versus non-STEMI #Hypertensive crisis #Pulmonary edema #Acute systolic heart failure, EF 35 to 40% with regional wall motion abnormalities -Admitted with hypertensive crisis.  Developed pulmonary edema versus pneumonia.  Was emergently intubated.  She is undergone hemodialysis and breath sounds are much improved.  Remains on broad-spectrum antibiotics. -She is off pressors.  Being weaned from the ventilator.  Suspect extubation today. -Found to have dynamic ST changes with minimal ST elevation in V2 and V3 and T wave inversions in the anterior lateral leads that are more prominent than prior EKGs. -Admitted with severely elevated BNP above 3000.  Also now with reduced EF 35 to 40% with wall motion abnormalities in the LAD distribution. -Shortness of breath and chest pain were reported prior to the episode.  Troponins are flat and minimally elevated.  This is reassuring. -CT PE study with no evidence of coronary calcium. -For  now she has been managed medically for non-STEMI.  Suspect this is still just demand ischemia but she likely will need left heart catheterization when she is more stable and out of ICU. -Continue aspirin 81 mg daily.  She will need 48 hours of heparin for medical management of potential non-STEMI. -She is on Lipitor 80 mg daily. -We will add guideline directed medical therapy for cardiomyopathy when she is out of ICU. -Blood cultures are negative.  Remains on broad spec antibiotics.  Cardiology will follow along.  For questions or updates, please contact [place] Please consult [email or web address] for contact info under   Time Spent with Patient: I have spent a total of 35 minutes with patient reviewing hospital notes, telemetry, EKGs, labs and examining the patient as well as establishing an assessment and plan that was discussed with the patient.  > 50% of time was spent in direct patient care.    Signed, Addison Naegeli. Audie Box, MD, [place]  07/10/2021 8:42 AM

## 2021-07-10 NOTE — Progress Notes (Signed)
Cardiology Progress Note  Patient ID: Melissa Howe MRN: [id number] DOB: 1951-05-30 Date of Encounter: 07/10/2021  Primary Cardiologist: None  Subjective   Chief Complaint: None.  Intubated.  HPI: Intubated on the vent.  Good volume removal yesterday.  On pressure support.  Critical care medicine expects extubation today.  ROS:  All other ROS reviewed and negative. Pertinent positives noted in the HPI.     Inpatient Medications  Scheduled Meds:  aspirin  81 mg Per Tube Daily   atorvastatin  80 mg Per Tube Daily   [START ON 07/11/2021] calcitRIOL  2 mcg Per Tube Q T,Th,Sa-HD   chlorhexidine gluconate (MEDLINE KIT)  15 mL Mouth Rinse BID   [START ON 07/11/2021] Chlorhexidine Gluconate Cloth  6 each Topical Daily   [START ON 07/11/2021] cinacalcet  90 mg Oral Q T,Th,Sa-HD   docusate  100 mg Per Tube BID   insulin aspart  0-6 Units Subcutaneous Q4H   insulin detemir  10 Units Subcutaneous QHS   mouth rinse  15 mL Mouth Rinse 10 times per day   pantoprazole  40 mg Intravenous Daily   polyethylene glycol  17 g Per Tube Daily   potassium chloride  20 mEq Per Tube Once   sodium chloride flush  10-40 mL Intracatheter Q12H   Continuous Infusions:  sodium chloride     sodium chloride     sodium chloride 5 mL/hr at 07/10/21 0300   ceFEPime (MAXIPIME) IV     clevidipine Stopped (07/09/21 1300)   fentaNYL infusion INTRAVENOUS 150 mcg/hr (07/09/21 1120)   heparin 900 Units/hr (07/10/21 0300)   norepinephrine (LEVOPHED) Adult infusion Stopped (07/09/21 1843)   propofol (DIPRIVAN) infusion 15 mcg/kg/min (07/10/21 0300)   vancomycin     PRN Meds: sodium chloride, sodium chloride, sodium chloride, alteplase, docusate, fentaNYL, heparin, lidocaine (PF), lidocaine-prilocaine, pentafluoroprop-tetrafluoroeth, polyethylene glycol, sodium chloride flush   Vital Signs   Vitals:   07/10/21 0630 07/10/21 0645 07/10/21 0700 07/10/21 0739  BP: (!) 151/68 (!) 152/73 (!) 148/76   Pulse: 73 75 68  76  Resp: 20 (!) _0 Temp:      TempSrc:      SpO2: 100% 100% 100% 100%  Weight:      Height:        Intake/Output Summary (Last 24 hours) at 07/10/2021 0842 Last data filed at 07/10/2021 0455 Gross per 24 hour  Intake 508.19 ml  Output 1380 ml  Net -871.81 ml   Last 3 Weights 07/09/2021 07/09/2021 07/09/2021  Weight (lbs) 162 lb 14.7 oz 165 lb 5.5 oz 165 lb 5.5 oz  Weight (kg) 73.9 kg 75 kg 75 kg      Telemetry  Overnight telemetry shows sinus rhythm in the 70s, which I personally reviewed.   ECG  The most recent ECG shows normal sinus rhythm, minimal ST elevation in V2 and V3, anterolateral T wave inversions, which I personally reviewed.   Physical Exam   Vitals:   07/10/21 0630 07/10/21 0645 07/10/21 0700 07/10/21 0739  BP: (!) 151/68 (!) 152/73 (!) 148/76   Pulse: 73 75 68 76  Resp: 20 (!) _1 Temp:      TempSrc:      SpO2: 100% 100% 100% 100%  Weight:      Height:        Intake/Output Summary (Last 24 hours) at 07/10/2021 0842 Last data filed at 07/10/2021 0455 Gross per 24 hour  Intake 508.19 ml  Output 1380 ml  Net -871.81 ml    Last 3 Weights 07/09/2021 07/09/2021 07/09/2021  Weight (lbs) 162 lb 14.7 oz 165 lb 5.5 oz 165 lb 5.5 oz  Weight (kg) 73.9 kg 75 kg 75 kg    Body mass index is 26.3 kg/m.   General: Intubated on the vent Head: Atraumatic, normal size  Eyes: PEERLA, EOMI  Neck: Supple, no JVD Endocrine: No thryomegaly Cardiac: Normal S1, S2; RRR; no murmurs, rubs, or gallops Lungs: Diminished breath sounds, improved from yesterday Abd: Soft, nontender, no hepatomegaly  Ext: No edema, pulses 2+ Musculoskeletal: No deformities Skin: Warm and dry, no rashes   Neuro: Awake and alert on the ventilator  Labs  High Sensitivity Troponin:   Recent Labs  Lab 07/09/21 1317 07/09/21 1450 07/09/21 1900 07/09/21 2136 07/10/21 0506  TROPONINIHS 531* 588* 859* 804* 711*     Cardiac EnzymesNo results for input(s): TROPONINI in the last 168  hours. No results for input(s): TROPIPOC in the last 168 hours.  Chemistry Recent Labs  Lab 07/09/21 0750 07/09/21 0804 07/09/21 0856 07/10/21 0506  NA 136 136 136 133*  K 4.0 4.1 3.9 3.1*  CL 96* 102  --  95*  CO2 25  --   --  25  GLUCOSE 349* 362*  --  98  BUN 52* 52*  --  21  CREATININE 6.78* 6.60*  --  4.25*  CALCIUM 9.2  --   --  8.2*  PROT 7.7  --   --   --   ALBUMIN 4.0  --   --  3.1*  AST 22  --   --   --   ALT 16  --   --   --   ALKPHOS 81  --   --   --   BILITOT 0.9  --   --   --   GFRNONAA 6*  --   --  11*  ANIONGAP 15  --   --  13    Hematology Recent Labs  Lab 07/09/21 0750 07/09/21 0804 07/09/21 0856 07/10/21 0506  WBC 12.7*  --   --  7.0  RBC 3.91  --   --  3.14*  HGB 11.5* 12.6 11.9* 9.3*  HCT 36.3 37.0 35.0* 27.2*  MCV 92.8  --   --  86.6  MCH 29.4  --   --  29.6  MCHC 31.7  --   --  34.2  RDW 15.0  --   --  15.2  PLT 311  --   --  227   BNP Recent Labs  Lab 07/09/21 1450  BNP 3,314.8*    DDimer No results for input(s): DDIMER in the last 168 hours.   Radiology  DG Chest Port 1 View  Result Date: 07/09/2021 CLINICAL DATA:  Endotracheal tube placement. EXAM: PORTABLE CHEST 1 VIEW COMPARISON:  07/09/2021, earlier same day FINDINGS: 0817 hours. Endotracheal tube tip is 2.3 cm above the base of the carina. The NG tube passes into the stomach although the distal tip position is not included on the film. Right IJ central line tip overlies the region of the SVC/RA junction. Diffuse parahilar airspace disease bilaterally suggests edema. Cardiopericardial silhouette is at upper limits of normal for size. Telemetry leads overlie the chest. IMPRESSION: Endotracheal tube tip is 2.3 cm above the base of the carina. Stable cardiopulmonary exam. Electronically Signed   By: Misty Stanley M.D.   On: 07/09/2021 08:42   DG Chest Portable 1 View  Result Date: 07/09/2021 CLINICAL  DATA:  Shortness of breath. EXAM: PORTABLE CHEST 1 VIEW COMPARISON:  July 23, 2020. FINDINGS: The heart size and mediastinal contours are within normal limits. Interval development of bilateral reticular and interstitial opacities concerning for pulmonary edema or atypical pneumonia. Right internal jugular catheter is noted with distal tip in expected position of right atrium. The visualized skeletal structures are unremarkable. IMPRESSION: Interval development of bilateral lung opacities concerning for edema or atypical pneumonia. Electronically Signed   By: Marijo Conception M.D.   On: 07/09/2021 08:09   DG Abd Portable 1V  Result Date: 07/09/2021 CLINICAL DATA:  Nasogastric tube placement. EXAM: PORTABLE ABDOMEN - 1 VIEW COMPARISON:  Chest x-ray 07/09/2021. FINDINGS: Nasogastric tube tip terminates over the mid stomach. Central venous catheter tip projects over the distal SVC, unchanged. There are no dilated bowel loops in the upper abdomen. There is left basilar atelectasis/airspace disease. IMPRESSION: Nasogastric tube tip projects over the mid stomach. Electronically Signed   By: Ronney Asters M.D.   On: 07/09/2021 19:25   ECHOCARDIOGRAM LIMITED  Result Date: 07/09/2021    ECHOCARDIOGRAM LIMITED REPORT   Patient Name:   CAMBRIE SONNENFELD Date of Exam: 07/09/2021 Medical Rec #:  [id number]         Height:       66.0 in Accession #:    [id number]        Weight:       165.3 lb Date of Birth:  [birth year]          BSA:          1.844 m Patient Age:    69 years          BP:           190/97 mmHg Patient Gender: F                 HR:           93 bpm. Exam Location:  Inpatient Procedure: 3D Echo, Limited Echo, Cardiac Doppler and Color Doppler STAT ECHO Indications:    R06.02 SOB  History:        Patient has prior history of Echocardiogram examinations.                 Signs/Symptoms:Dizziness/Lightheadedness, Syncope and                 Bacteremia; Risk Factors:Hypertension, Diabetes and                 Dyslipidemia. Kidney disorder. Flash pulmonary edema.  Sonographer:    Roseanna Rainbow RDCS  Referring Phys: [id number] Candee Furbish  Sonographer Comments: Echo performed with patient supine and on artificial respirator and Technically difficult study due to poor echo windows. IMPRESSIONS  1. Global hypokinesis worse in the anteroseptal and mid-apical anterior myocardium. Left ventricular ejection fraction, by estimation, is 35 to 40%. The left ventricle has moderately decreased function. The left ventricle demonstrates regional wall motion abnormalities (see scoring diagram/findings for description). Left ventricular diastolic parameters are consistent with Grade I diastolic dysfunction (impaired relaxation).  2. Right ventricular systolic function is normal. There is mildly elevated pulmonary artery systolic pressure.  3. The mitral valve is normal in structure. Mild mitral valve regurgitation. No evidence of mitral stenosis.  4. The aortic valve is tricuspid. Aortic valve regurgitation is not visualized. No aortic stenosis is present.  5. The inferior vena cava is dilated in size with <50% respiratory variability, suggesting right atrial pressure of 15 mmHg. Comparison(s):  Compared with the echo 12/2019, the echo now shows worse systolic function in the anterior and anterosepal myocardium. Previously, the systolic dysfunction was similar globally. FINDINGS  Left Ventricle: Global hypokinesis worse in the anteroseptal and mid-apical anterior myocardium. Left ventricular ejection fraction, by estimation, is 35 to 40%. The left ventricle has moderately decreased function. The left ventricle demonstrates regional wall motion abnormalities. The left ventricular internal cavity size was normal in size. There is no left ventricular hypertrophy. Left ventricular diastolic parameters are consistent with Grade I diastolic dysfunction (impaired relaxation). Right Ventricle: No increase in right ventricular wall thickness. Right ventricular systolic function is normal. There is mildly elevated pulmonary artery  systolic pressure. The tricuspid regurgitant velocity is 2.37 m/s, and with an assumed right atrial  pressure of 15 mmHg, the estimated right ventricular systolic pressure is 62.3 mmHg. Mitral Valve: The mitral valve is normal in structure. Mild mitral annular calcification. Mild mitral valve regurgitation. No evidence of mitral valve stenosis. Tricuspid Valve: The tricuspid valve is normal in structure. Tricuspid valve regurgitation is trivial. No evidence of tricuspid stenosis. Aortic Valve: The aortic valve is tricuspid. Aortic valve regurgitation is not visualized. No aortic stenosis is present. Pulmonic Valve: The pulmonic valve was normal in structure. Pulmonic valve regurgitation is not visualized. No evidence of pulmonic stenosis. Aorta: The aortic root is normal in size and structure. Venous: The inferior vena cava is dilated in size with less than 50% respiratory variability, suggesting right atrial pressure of 15 mmHg. LEFT VENTRICLE PLAX 2D LVIDd:         3.80 cm LVIDs:         3.20 cm LV PW:         0.92 cm LV IVS:        1.08 cm  LV Volumes (MOD) LV vol d, MOD A2C: 69.0 ml LV vol d, MOD A4C: 67.9 ml LV vol s, MOD A2C: 42.9 ml LV vol s, MOD A4C: 36.6 ml LV SV MOD A2C:     26.1 ml LV SV MOD A4C:     67.9 ml LV SV MOD BP:      27.9 ml IVC IVC diam: 2.10 cm LEFT ATRIUM         Index LA diam:    3.00 cm 1.63 cm/m   AORTA Ao Root diam: 2.90 cm Ao Asc diam:  3.00 cm TRICUSPID VALVE TR Peak grad:   22.5 mmHg TR Vmax:        237.42 cm/s Skeet Latch MD Electronically signed by Skeet Latch MD Signature Date/Time: 07/09/2021/12:12:27 PM    Final     Cardiac Studies  TTE 07/09/2021  1. Global hypokinesis worse in the anteroseptal and mid-apical anterior  myocardium. Left ventricular ejection fraction, by estimation, is 35 to  40%. The left ventricle has moderately decreased function. The left  ventricle demonstrates regional wall  motion abnormalities (see scoring diagram/findings for description).  Left  ventricular diastolic parameters are consistent with Grade I diastolic  dysfunction (impaired relaxation).   2. Right ventricular systolic function is normal. There is mildly  elevated pulmonary artery systolic pressure.   3. The mitral valve is normal in structure. Mild mitral valve  regurgitation. No evidence of mitral stenosis.   4. The aortic valve is tricuspid. Aortic valve regurgitation is not  visualized. No aortic stenosis is present.   5. The inferior vena cava is dilated in size with <50% respiratory  variability, suggesting right atrial pressure of 15 mmHg.   Patient Profile  Sholanda Croson is a 70 y.o. female with diabetes, ESRD on hemodialysis, hypertension who was admitted on 07/09/2021 with acute epoxy respiratory failure requiring intubation secondary to pulmonary edema.  Cardiology was consulted due to abnormal EKG and troponin elevation.  Also found to have newly reduced EF 35 to 40%.  Assessment & Plan   #Troponin elevation, demand ischemia versus non-STEMI #Hypertensive crisis #Pulmonary edema #Acute systolic heart failure, EF 35 to 40% with regional wall motion abnormalities -Admitted with hypertensive crisis.  Developed pulmonary edema versus pneumonia.  Was emergently intubated.  She is undergone hemodialysis and breath sounds are much improved.  Remains on broad-spectrum antibiotics. -She is off pressors.  Being weaned from the ventilator.  Suspect extubation today. -Found to have dynamic ST changes with minimal ST elevation in V2 and V3 and T wave inversions in the anterior lateral leads that are more prominent than prior EKGs. -Admitted with severely elevated BNP above 3000.  Also now with reduced EF 35 to 40% with wall motion abnormalities in the LAD distribution. -Shortness of breath and chest pain were reported prior to the episode.  Troponins are flat and minimally elevated.  This is reassuring. -CT PE study with no evidence of coronary calcium. -For  now she has been managed medically for non-STEMI.  Suspect this is still just demand ischemia but she likely will need left heart catheterization when she is more stable and out of ICU. -Continue aspirin 81 mg daily.  She will need 48 hours of heparin for medical management of potential non-STEMI. -She is on Lipitor 80 mg daily. -We will add guideline directed medical therapy for cardiomyopathy when she is out of ICU. -Blood cultures are negative.  Remains on broad spec antibiotics.  Cardiology will follow along.  For questions or updates, please contact [place] Please consult [email or web address] for contact info under   Time Spent with Patient: I have spent a total of 35 minutes with patient reviewing hospital notes, telemetry, EKGs, labs and examining the patient as well as establishing an assessment and plan that was discussed with the patient.  > 50% of time was spent in direct patient care.    Signed, Addison Naegeli. Audie Box, MD, [place]  07/10/2021 8:42 AM
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
## 2021-07-10 NOTE — Progress Notes (Signed)
[facility] Progress Note Patient Name: Collier Ysaguirre DOB: 1950/11/14 MRN: [id number]   Date of Service  07/10/2021  HPI/Events of Note  Patient requesting PRN analgesic for musculoskeletal pain.  eICU Interventions  PRN Tylenol ordered.        Kerry Kass Torion Hulgan 07/10/2021, 10:59 PM

## 2021-07-10 NOTE — Procedures (Signed)
Extubation Procedure Note  Patient Details:   Name: Melissa Howe DOB: [birth year] MRN: [id number]   Airway Documentation:    Vent end date: 07/10/21 Vent end time: 1029   Evaluation  O2 sats: stable throughout Complications: No apparent complications Patient did tolerate procedure well. Bilateral Breath Sounds: Clear, Diminished   Yes  Patient extubated per MD order. Positive cuff leak. No stridor noted. Vitals are stable on 2L [place]. RN at bedside.  Yulia Ulrich H Shanin Szymanowski 07/10/2021, 10:30 AM

## 2021-07-10 NOTE — Progress Notes (Signed)
[facility] Progress Note  Name: Delories Ziemer MRN: [id number] DOB: 05/03/1951  Chief Complaint:  Shortness of breath   Subjective:  She underwent HD on 9/13 after arrival to MICU.  Only had 1.1 kg off with goal of 4 kg as intubated for resp distress - she didn't tolerate UF due to hypotension despite titration of sedation.  Per charting on levophed at one point and was seen by cardiology for troponin elevation.    Review of systems:  Unable to obtain 2/2 intubated ------  Background on consult:  Allicyn Dorriety is a 70 y.o. 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 She has been on HD since January 2021.  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    Reviewed outpatient HD records. Patient reported mild SOB, stomach aches, and nausea/vomiting pre-HD today. After HD was initiated, her SOB worsened and EMS was called. Her last HD was on 9/10 where she received full treatment. Appears she has been compliant with outpatient HD treatments. Patient presented to the ED in acute respiratory distress and volume overloaded. She was found to be hypertensive with sys in 200s. CXR showed bilateral pulmonary edema. She was intubated for resp distress   Intake/Output Summary (Last 24 hours) at 07/10/2021 0644 Last data filed at 07/10/2021 0455 Gross per 24 hour  Intake 508.19 ml  Output 1380 ml  Net -871.81 ml    Vitals:  Vitals:   07/10/21 0200 07/10/21 0300 07/10/21 0324 07/10/21 0329  BP: 114/84 109/64  110/61  Pulse: 77 77  94  Resp: (!) 24 20  (!) 23  Temp:   99 F (37.2 C)   TempSrc:   Axillary   SpO2: 100% 100%  100%  Weight:      Height:         Physical Exam:  148/76 on my exam General elderly female intubated HEENT normocephalic atraumatic Neck trachea midline Lungs coarse mechanical breath sounds Heart S1S2 no rub Abdomen soft nontender nondistended Extremities no edema; no  cyanosis or clubbing Neuro - sedation running; she is interactive and nods  Psych - in restraints and moves her arm; but otherwise calm Access: left AVF with bruit and thrill; RIJ tunn catheter   Medications reviewed   Labs:  BMP Latest Ref Rng & Units 07/10/2021 07/09/2021 07/09/2021  Glucose 70 - 99 mg/dL 98 - 362(H)  BUN 8 - 23 mg/dL 21 - 52(H)  Creatinine 0.44 - 1.00 mg/dL 4.25(H) - 6.60(H)  Sodium 135 - 145 mmol/L 133(L) 136 136  Potassium 3.5 - 5.1 mmol/L 3.1(L) 3.9 4.1  Chloride 98 - 111 mmol/L 95(L) - 102  CO2 22 - 32 mmol/L 25 - -  Calcium 8.9 - 10.3 mg/dL 8.2(L) - -    Dialysis Orders:  TTS - [facility]  4hrs, BFR 400, DFR Auto 1.5,  EDW 73.5kg, 2K/ 2Ca   Heparin 2000 bolus with HD Mircera 75 mcg q4wks - last 06/20/21 Calcitriol 24mg PO qHD-last 07/06/21 Venofer '50mg'$  IV weekly-last 07/04/21 Sensipar '90mg'$  with HD-last 07/06/21  Assessment/Plan:   # Acute hypoxic resp failure - flash pulm edema in setting of HTN emergency; also concern for PNA - vent per critical care - may be doing a breathing trial today per team - optimize volume with HD - abx per primary team as well; they have sent blood cultures as well (no prelim back yet). Note tunn catheter and they  have been using her AVF recently.  Retain catheter for now while awaiting data  # ESRD  - HD per TTS schedule as hemodynamics permit; if unstable then CRRT and has access for either  At this time stable for HD   # Acute systolic congestive heart failure - optimize volume with HD  # HTN emergency - improved control and labile; briefly on levo yesterday  - had hypotension with attempted UF with HD and goal was lowered due to same - oral agents per primary team to coordinate with her sedation   # Hypokalemia - potassium 20 meq once now  # Anemia CKD  - previously above goal for ESRD - if persistent on repeat will redose ESA tomorrow   Secondary hyperparathyroidism - trend phos; note on sensipar  outpt with HD as well as calcitriol.  Please do not include either med on discharge summary  Claudia Desanctis, MD 07/10/2021 7:19 AM

## 2021-07-10 NOTE — Progress Notes (Signed)
NAME:  Melissa Howe, MRN:  [id number], DOB:  01/06/1951, LOS: 1 ADMISSION DATE:  07/09/2021, CONSULTATION DATE:  07/09/2021 REFERRING MD:  EDP CHIEF COMPLAINT:  Respiratory distress   History of Present Illness:  Melissa Howe is a 70 y/o female with a PMHx of ESRD (TTSa), T2DM, HFrEF (EF of 45%) who presents to the ED with c/o respiratory failure.   This patient is a 70 year old female with end-stage renal disease on dialysis, also known to have diabetes hypertension, she presented to dialysis this morning with some shortness of breath, it was not that bad so they hooked her up but did not start the dialysis session before she became severely short of breath diaphoretic and was noted to be severely hypertensive.  She was immediately transported by EMS to the hospital, the patient is not able to speak in more than 1 or 2 word sentences due to severe respiratory distress thus a level 5 caveat applies.  She is able to indicate to me that she has never had a heart attack, never had asthma, COPD or congestive heart failure to the best of her knowledge.  On examination, patient is intubated and sedated.   Pertinent  Medical History  ESRD on TTSa T2DM, insulin dependent  HTN HFrEF (45%)  Significant Hospital Events: Including procedures, antibiotic start and stop dates in addition to other pertinent events   9/13 >> Admitted for respiratory distress, intubated   Interim History / Subjective:   No acute overnight events.   This AM, Melissa Howe is alert and awake. She is able to nod to answer questions. She denies feeling ill prior to SOB  and vomiting that began yesterday morning. At this time, she denies any chest pain, palpitations.   Objective   Blood pressure (!) 148/76, pulse 68, temperature 99 F (37.2 C), temperature source Axillary, resp. rate 18, height '5\' 6"'$  (1.676 m), weight 73.9 kg, SpO2 100 %.    Vent Mode: PRVC FiO2 (%):  [40 %-100 %] 40 % Set Rate:  [20 bmp] 20 bmp Vt  Set:  [470 mL] 470 mL PEEP:  [5 cmH20-10 cmH20] 5 cmH20 Plateau Pressure:  [17 cmH20-26 cmH20] 18 cmH20   Intake/Output Summary (Last 24 hours) at 07/10/2021 0741 Last data filed at 07/10/2021 0455 Gross per 24 hour  Intake 508.19 ml  Output 1380 ml  Net -871.81 ml   Filed Weights   07/09/21 0700 07/09/21 1428 07/09/21 1825  Weight: 75 kg 75 kg 73.9 kg   Examination: General: Critically ill appearing female. Awake and alert.  HENT: Mucus membranes moist. ETT and OT tube in place.  Lungs: Minimal rhonchi with no wheezing.  Cardiovascular: Regular rate and rhythm with no murmurs, rubs or gallops Abdomen: Soft and non-tender. Hypoactive bowel sounds.  Extremities: Trace pitting edema bilaterally. No rashes or lesions.  Neuro: Awake, alert and following commands.   Resolved Hospital Problem list   N/A  Assessment & Plan:   Hypertensive Emergency  NSTEMI Inciting event unknown at this time. Cardiology consulted for dynamic EKG changes with new RWMA on echo. Plan for cath once stabilized, however they do not feel cardiac etiology behind HTN emergency and NSTEMI (ST changes in V2-3, T wave inversions in V4-6, peak trop 859). Differential also includes infectious etiology, as she has a previous history of similar symptoms with MSSA bacteremia in 2021.    At this time, BP has normalized. Given she will need GDMT for HFrEF, will not restart home Amlodipine and start  Coreg.   - Continue Heparin gtt for 48 hours  - Start Coreg 6.25 mg BID - Blood cultures pending - negative thus far - Continue Vanc and Cefepime, Day 2  Pulmonary Edema  Acute Respiratory Distress  Significantly improved today after HD yesterday with 1.1 L out. Will work towards extubation today.   - Extubation today - HD per Nephro for volume removal.   ESRD  - Nephrology following  Type 2 Diabetes Mellitus Uncontrolled with a1c of 8.8%. On Levemir 30 units at home. Euglycemic at this time.   - SSI    HFrEF Echo on 9/13 with EF of 35-40% with regional wall motion abnormalities consistent with the LAD region. Etiology ischemic versus stress induced.  - Cardiology consulted; appreciate their recommendations - Start Coreg 6.25 mg BID - Cath potentially later this admission   Normocytic Anemia  Low suspicion for bleed. Will monitor closely.   - Trend hemoglobin   HLD LDL increased at 145. Goal < 70.   - Increase Atorvastatin to 80 mg daily     Best Practice (right click and "Reselect all SmartList Selections" daily)   Diet/type: NPO DVT prophylaxis: prophylactic heparin  GI prophylaxis: PPI Lines: Central line and yes and it is still needed Foley:  N/A Code Status:  full code Last date of multidisciplinary goals of care discussion [9/13]  Labs   CBC: Recent Labs  Lab 07/09/21 0750 07/09/21 0804 07/09/21 0856 07/10/21 0506  WBC 12.7*  --   --  7.0  HGB 11.5* 12.6 11.9* 9.3*  HCT 36.3 37.0 35.0* 27.2*  MCV 92.8  --   --  86.6  PLT 311  --   --  [id number]    Basic Metabolic Panel: Recent Labs  Lab 07/09/21 0750 07/09/21 0804 07/09/21 0856 07/10/21 0506  NA 136 136 136 133*  K 4.0 4.1 3.9 3.1*  CL 96* 102  --  95*  CO2 25  --   --  25  GLUCOSE 349* 362*  --  98  BUN 52* 52*  --  21  CREATININE 6.78* 6.60*  --  4.25*  CALCIUM 9.2  --   --  8.2*  PHOS  --   --   --  2.8    GFR: Estimated Creatinine Clearance: 12.5 mL/min (A) (by C-G formula based on SCr of 4.25 mg/dL (H)). Recent Labs  Lab 07/09/21 0750 07/10/21 0506 07/10/21 0639  WBC 12.7* 7.0  --   LATICACIDVEN 2.2*  --  1.0    Liver Function Tests: Recent Labs  Lab 07/09/21 0750 07/10/21 0506  AST 22  --   ALT 16  --   ALKPHOS 81  --   BILITOT 0.9  --   PROT 7.7  --   ALBUMIN 4.0 3.1*    No results for input(s): LIPASE, AMYLASE in the last 168 hours. No results for input(s): AMMONIA in the last 168 hours.  ABG    Component Value Date/Time   PHART 7.415 07/09/2021 0856   PCO2ART  39.6 07/09/2021 0856   PO2ART 267 (H) 07/09/2021 0856   HCO3 25.4 07/09/2021 0856   TCO2 27 07/09/2021 0856   O2SAT 99.6 07/09/2021 1620   Coagulation Profile: Recent Labs  Lab 07/09/21 0750  INR 1.0    Cardiac Enzymes: No results for input(s): CKTOTAL, CKMB, CKMBINDEX, TROPONINI in the last 168 hours.  HbA1C: Hgb A1c MFr Bld  Date/Time Value Ref Range Status  07/09/2021 02:50 PM 8.8 (H) 4.8 - 5.6 %  Final    Comment:    (NOTE) Pre diabetes:          5.7%-6.4%  Diabetes:              >6.4%  Glycemic control for   <7.0% adults with diabetes   07/21/2020 01:27 PM 8.7 (H) 4.8 - 5.6 % Final    Comment:    (NOTE) Pre diabetes:          5.7%-6.4%  Diabetes:              >6.4%  Glycemic control for   <7.0% adults with diabetes    CBG: Recent Labs  Lab 07/09/21 1247 07/09/21 1548 07/09/21 1951 07/10/21 0008 07/10/21 0322  GLUCAP 339* 203* 119* 99 81    Review of Systems:   Negative except as noted above.   Past Medical History:  She,  has a past medical history of Anemia, Chronic kidney disease, Constipation, Diabetes mellitus, Dyspnea, History of blood transfusion, Hyperlipemia, Hypertension, and Pneumonia (2019).   Surgical History:   Past Surgical History:  Procedure Laterality Date   APPENDECTOMY     AV FISTULA PLACEMENT Left 10/05/2019   Procedure: INSERTION OF ARTERIOVENOUS (AV) GORE-TEX VASCULAR GRAFT LEFT ARM;  Surgeon: [name], MD;  Location: [place];  Service: Vascular;  Laterality: Left;   AV FISTULA PLACEMENT Left 02/27/2021   Procedure: LEFT ARM FIRST STAGE BASILIC VEIN  ARTERIOVENOUS (AV) FISTULA CREATION;  Surgeon: Waynetta Sandy, MD;  Location: [place];  Service: Vascular;  Laterality: Left;   Earth Left 04/17/2021   Procedure: LEFT SECOND STAGE [place];  Surgeon: Waynetta Sandy, MD;  Location: [place];  Service: Vascular;  Laterality: Left;   BIOPSY  07/24/2020   Procedure: BIOPSY;   Surgeon: Doran Stabler, MD;  Location: [place];  Service: Gastroenterology;;   BUBBLE STUDY  01/02/2020   Procedure: BUBBLE STUDY;  Surgeon: Elouise Munroe, MD;  Location: [facility] ENDOSCOPY;  Service: Cardiology;;   COLONOSCOPY WITH PROPOFOL N/A 07/24/2020   Procedure: COLONOSCOPY WITH PROPOFOL;  Surgeon: Doran Stabler, MD;  Location: [place];  Service: Gastroenterology;  Laterality: N/A;   ESOPHAGOGASTRODUODENOSCOPY (EGD) WITH PROPOFOL N/A 07/24/2020   Procedure: ESOPHAGOGASTRODUODENOSCOPY (EGD) WITH PROPOFOL;  Surgeon: Doran Stabler, MD;  Location: [place];  Service: Gastroenterology;  Laterality: N/A;   EYE SURGERY Bilateral    Cataract   FOREIGN BODY REMOVAL Left 05/13/2018   Procedure: FOREIGN BODY REMOVAL left foot;  Surgeon: Meredith Pel, MD;  Location: [place];  Service: Orthopedics;  Laterality: Left;   I & D EXTREMITY Left 05/21/2018   Procedure: LEFT FOOT DEBRIDEMENT AND WOUND CLOSURE;  Surgeon: Newt Minion, MD;  Location: [place];  Service: Orthopedics;  Laterality: Left;   POLYPECTOMY  07/24/2020   Procedure: POLYPECTOMY;  Surgeon: Doran Stabler, MD;  Location: [place];  Service: Gastroenterology;;   TEE WITHOUT CARDIOVERSION N/A 01/02/2020   Procedure: TRANSESOPHAGEAL ECHOCARDIOGRAM (TEE);  Surgeon: Elouise Munroe, MD;  Location: [place];  Service: Cardiology;  Laterality: N/A;   TONSILLECTOMY     TUBAL LIGATION       Social History:   reports that she quit smoking about 37 years ago. Her smoking use included cigarettes. She has never used smokeless tobacco. She reports that she does not drink alcohol and does not use drugs.   Family History:  Her family history includes Diabetes in her mother; Hypertension in her mother  and sister.   Allergies No Known Allergies   Home Medications  Prior to Admission medications   Medication Sig Start Date End Date Taking? Authorizing Provider  acetaminophen (TYLENOL) 500 MG tablet Take 500  mg by mouth every 6 (six) hours as needed for moderate pain or headache.    [provider]  amLODipine (NORVASC) 10 MG tablet Take 20 mg by mouth every evening.    [provider]  AURYXIA 1 GM 210 MG(Fe) tablet Take 420 mg by mouth See admin instructions. Take 2 tablets (420 mg) by mouth each meal and each snack 06/18/20   [provider]  calcitRIOL (ROCALTROL) 0.5 MCG capsule Take 5 capsules (2.5 mcg total) by mouth Every Tuesday,Thursday,and Saturday with dialysis. 07/24/20   Pokhrel, Corrie Mckusick, MD  Cinacalcet HCl (SENSIPAR PO) Take by mouth. 04/09/21 04/08/22  [provider]  HYDROcodone-acetaminophen (NORCO/VICODIN) 5-325 MG tablet Take 1 tablet by mouth every 4 (four) hours as needed for moderate pain. 04/17/21 04/17/22  Setzer, Edman Circle, PA-C  Insulin Detemir (LEVEMIR) 100 UNIT/ML Pen Inject 18 Units into the skin daily with breakfast. Patient taking differently: Inject 30 Units into the skin at bedtime. 08/11/19   Terrilee Croak, MD  Insulin Pen Needle 32G X 4 MM MISC Use with insulin pen to dispense insulin as directed 09/08/16   Tat, Shanon Brow, MD  iron sucrose in sodium chloride 0.9 % 100 mL Iron Sucrose (Venofer) 02/28/21 02/27/22  [provider]  lubiprostone (AMITIZA) 24 MCG capsule Take 24 mcg by mouth 2 (two) times daily as needed for constipation. 01/22/21   [provider]  Methoxy PEG-Epoetin Beta (MIRCERA IJ) Mircera 02/21/21 02/20/22  [provider]  multivitamin (RENA-VIT) TABS tablet Take 1 tablet by mouth every evening. 11/22/19   [provider]  VITAMIN D PO Take 4 tablets by mouth See admin instructions. Given at Dialysis 02/19/21   [provider]    Dr. Jose Persia Internal Medicine PGY-3 07/10/2021, 7:41 AM

## 2021-07-11 DIAGNOSIS — I161 Hypertensive emergency: Secondary | ICD-10-CM | POA: Diagnosis not present

## 2021-07-11 LAB — RENAL FUNCTION PANEL
Albumin: 2.7 g/dL — ABNORMAL LOW (ref 3.5–5.0)
Anion gap: 15 (ref 5–15)
BUN: 37 mg/dL — ABNORMAL HIGH (ref 8–23)
CO2: 26 mmol/L (ref 22–32)
Calcium: 8.4 mg/dL — ABNORMAL LOW (ref 8.9–10.3)
Chloride: 94 mmol/L — ABNORMAL LOW (ref 98–111)
Creatinine, Ser: 6.25 mg/dL — ABNORMAL HIGH (ref 0.44–1.00)
GFR, Estimated: 7 mL/min — ABNORMAL LOW (ref >60)
Glucose, Bld: 87 mg/dL (ref 70–99)
Phosphorus: 5.7 mg/dL — ABNORMAL HIGH (ref 2.5–4.6)
Potassium: 3.5 mmol/L (ref 3.5–5.1)
Sodium: 135 mmol/L (ref 135–145)

## 2021-07-11 LAB — GLUCOSE, CAPILLARY
Glucose-Capillary: 104 mg/dL — ABNORMAL HIGH (ref 70–99)
Glucose-Capillary: 127 mg/dL — ABNORMAL HIGH (ref 70–99)
Glucose-Capillary: 159 mg/dL — ABNORMAL HIGH (ref 70–99)
Glucose-Capillary: 166 mg/dL — ABNORMAL HIGH (ref 70–99)
Glucose-Capillary: 193 mg/dL — ABNORMAL HIGH (ref 70–99)
Glucose-Capillary: 225 mg/dL — ABNORMAL HIGH (ref 70–99)
Glucose-Capillary: 70 mg/dL (ref 70–99)

## 2021-07-11 LAB — CBC
HCT: 28.3 % — ABNORMAL LOW (ref 36.0–46.0)
Hemoglobin: 8.9 g/dL — ABNORMAL LOW (ref 12.0–15.0)
MCH: 29 pg (ref 26.0–34.0)
MCHC: 31.4 g/dL (ref 30.0–36.0)
MCV: 92.2 fL (ref 80.0–100.0)
Platelets: 218 10*3/uL (ref 150–400)
RBC: 3.07 MIL/uL — ABNORMAL LOW (ref 3.87–5.11)
RDW: 15.3 % (ref 11.5–15.5)
WBC: 7.6 10*3/uL (ref 4.0–10.5)
nRBC: 0 % (ref 0.0–0.2)

## 2021-07-11 LAB — HEPARIN LEVEL (UNFRACTIONATED): Heparin Unfractionated: 0.43 IU/mL (ref 0.30–0.70)

## 2021-07-11 MED ORDER — ASPIRIN 81 MG PO CHEW
81.0000 mg | CHEWABLE_TABLET | ORAL | Status: AC
  Administered 2021-07-12: 81 mg via ORAL
  Filled 2021-07-11: qty 1

## 2021-07-11 MED ORDER — "THROMBI-PAD 3""X3"" EX PADS"
1.0000 | MEDICATED_PAD | CUTANEOUS | Status: AC
  Administered 2021-07-11: 1 via TOPICAL
  Filled 2021-07-11: qty 1

## 2021-07-11 MED ORDER — SODIUM CHLORIDE 0.9% FLUSH: 3.0000 mL | INTRAVENOUS | Status: DC | PRN

## 2021-07-11 MED ORDER — SODIUM CHLORIDE 0.9 % IV SOLN: 250.0000 mL | INTRAVENOUS | Status: DC | PRN

## 2021-07-11 MED ORDER — "THROMBI-PAD 3""X3"" EX PADS"
1.0000 | MEDICATED_PAD | Freq: Once | CUTANEOUS | Status: AC
  Administered 2021-07-11: 1 via TOPICAL
  Filled 2021-07-11: qty 1

## 2021-07-11 MED ORDER — SODIUM CHLORIDE 0.9% FLUSH
3.0000 mL | Freq: Two times a day (BID) | INTRAVENOUS | Status: DC
  Administered 2021-07-11: 3 mL via INTRAVENOUS

## 2021-07-11 MED ORDER — SODIUM CHLORIDE 0.9 % IV SOLN: INTRAVENOUS | Status: DC

## 2021-07-11 MED ORDER — DARBEPOETIN ALFA 60 MCG/0.3ML IJ SOSY
60.0000 ug | PREFILLED_SYRINGE | INTRAMUSCULAR | Status: DC
  Administered 2021-07-11: 60 ug via INTRAVENOUS
  Filled 2021-07-11 (×2): qty 0.3

## 2021-07-11 NOTE — TOC Progression Note (Signed)
Transition of Care [facility]) - Progression Note    Patient Details  Name: Melissa Howe MRN: [id number] Date of Birth: [birth year]  Transition of Care [facility]) CM/SW Contact  Tom-Johnson, Renea Ee, RN Phone Number: 07/11/2021, 9:05 AM  Clinical Narrative:    CM called and spoke with Carlandra [phone]) at [facility] office about applying for transportation to and from dialysis for patient. Patient's information given to her and she states she will send the message to the [facility] worker and they will call patient or her daughter to do an assessment. Patient and daughter notified. CM will continue to follow with needs.    Barriers to Discharge: Continued Medical Work up  Expected Discharge Plan and Services     Discharge Planning Services: CM Consult   Living arrangements for the past 2 months: Single Family Home                                       Social Determinants of Health (SDOH) Interventions    Readmission Risk Interventions No flowsheet data found.

## 2021-07-11 NOTE — Plan of Care (Signed)
Patient is added to cardiac cath board tomorrow per Dr Audie Box request, pre-cath order placed, no IVF given ESRD and CHF, NPO after midnight.

## 2021-07-11 NOTE — Progress Notes (Signed)
ANTICOAGULATION CONSULT NOTE  Pharmacy Consult for heparin Indication: chest pain/ACS  No Known Allergies  Patient Measurements: Height: '5\' 6"'$  (167.6 cm) Weight: 73.9 kg (162 lb 14.7 oz) IBW/kg (Calculated) : 59.3 Heparin Dosing Weight: 75kg  Vital Signs: Temp: 97.6 F (36.4 C) (09/15 0700) Temp Source: Oral (09/15 0700) BP: 108/86 (09/15 0838) Pulse Rate: 72 (09/15 0838)  Labs: Recent Labs    07/09/21 0750 07/09/21 0804 07/09/21 0856 07/09/21 1317 07/09/21 1900 07/09/21 2136 07/10/21 0007 07/10/21 0506 07/10/21 1329 07/11/21 0443 07/11/21 0630  HGB 11.5* 12.6 11.9*  --   --   --   --  9.3*  --  8.9*  --   HCT 36.3 37.0 35.0*  --   --   --   --  27.2*  --  28.3*  --   PLT 311  --   --   --   --   --   --  227  --  218  --   APTT  --   --  24  --   --   --   --   --   --   --   --   LABPROT 12.9  --   --   --   --   --   --   --   --   --   --   INR 1.0  --   --   --   --   --   --   --   --   --   --   HEPARINUNFRC  --   --   --   --   --   --  0.42  --  0.31 0.43  --   CREATININE 6.78* 6.60*  --   --   --   --   --  4.25*  --   --  6.25*  TROPONINIHS  --   --   --    < > 859* 804*  --  711*  --   --   --    < > = values in this interval not displayed.    Estimated Creatinine Clearance: 8.5 mL/min (A) (by C-G formula based on SCr of 6.25 mg/dL (H)).   Medical History: Past Medical History:  Diagnosis Date   Anemia    Chronic kidney disease    Constipation    Diabetes mellitus    Type II   Dyspnea    with exertion   History of blood transfusion    Hyperlipemia    Hypertension    Pneumonia 2019    Assessment: 89 yoF with ESRD on HD admitted with hypertensive urgency. Trops bumped likely due to demand, covering with heparin for possible ACS for now. CBC wnl, no AC PTA.   Heparin level at goal on 950 units/hr. No bleeding or IV issues noted.  Per Cardiology - plan for cardiac cath Friday  Goal of Therapy:  Heparin level 0.3-0.7 units/ml Monitor  platelets by anticoagulation protocol: Yes   Plan:  Continue Heparin to 950 units/hr (9.5 ml/hr) to keep in goal.  Daily heparin level and CBC Plan for Cath 10AM on Friday 07/12/21.   Sloan Leiter, PharmD, BCPS, BCCCP Clinical Pharmacist Please refer to [facility] for [place] numbers 07/11/2021 10:45 AM

## 2021-07-11 NOTE — Progress Notes (Signed)
Cardiology Progress Note  Patient ID: Melissa Howe MRN: [id number] DOB: 02-14-1951 Date of Encounter: 07/11/2021  Primary Cardiologist: Evalina Field, MD  Subjective   Chief Complaint: None.   HPI: Extubated.  No chest pain.  No trouble breathing.  Doing well.  ROS:  All other ROS reviewed and negative. Pertinent positives noted in the HPI.     Inpatient Medications  Scheduled Meds:  aspirin  81 mg Oral Daily   atorvastatin  80 mg Oral Daily   calcitRIOL  2 mcg Oral Q T,Th,Sa-HD   carvedilol  6.25 mg Oral BID WC   chlorhexidine gluconate (MEDLINE KIT)  15 mL Mouth Rinse BID   Chlorhexidine Gluconate Cloth  6 each Topical Daily   Chlorhexidine Gluconate Cloth  6 each Topical Q0600   cinacalcet  90 mg Oral Q T,Th,Sa-HD   darbepoetin (ARANESP) injection - DIALYSIS  60 mcg Intravenous Q Thu-HD   docusate sodium  100 mg Oral BID   insulin aspart  0-6 Units Subcutaneous Q4H   insulin detemir  10 Units Subcutaneous QHS   polyethylene glycol  17 g Oral Daily   sodium chloride flush  10-40 mL Intracatheter Q12H   Continuous Infusions:  sodium chloride     sodium chloride     sodium chloride Stopped (07/11/21 0214)   ceFEPime (MAXIPIME) IV     heparin 950 Units/hr (07/11/21 0700)   PRN Meds: sodium chloride, sodium chloride, sodium chloride, acetaminophen, alteplase, docusate sodium, heparin, lidocaine (PF), lidocaine-prilocaine, pentafluoroprop-tetrafluoroeth, polyethylene glycol, sodium chloride flush   Vital Signs   Vitals:   07/11/21 0500 07/11/21 0600 07/11/21 0700 07/11/21 0838  BP: (!) 126/59 120/61 134/61 108/86  Pulse: 66 67 72 72  Resp: 19 17 19    Temp:   97.6 F (36.4 C)   TempSrc:   Oral   SpO2: 93% 93% 93%   Weight:      Height:        Intake/Output Summary (Last 24 hours) at 07/11/2021 0959 Last data filed at 07/11/2021 0700 Gross per 24 hour  Intake 292.22 ml  Output 245 ml  Net 47.22 ml   Last 3 Weights 07/09/2021 07/09/2021 07/09/2021  Weight  (lbs) 162 lb 14.7 oz 165 lb 5.5 oz 165 lb 5.5 oz  Weight (kg) 73.9 kg 75 kg 75 kg      Telemetry  Overnight telemetry shows sinus rhythm in the 70s, which I personally reviewed.   Physical Exam   Vitals:   07/11/21 0500 07/11/21 0600 07/11/21 0700 07/11/21 0838  BP: (!) 126/59 120/61 134/61 108/86  Pulse: 66 67 72 72  Resp: 19 17 19    Temp:   97.6 F (36.4 C)   TempSrc:   Oral   SpO2: 93% 93% 93%   Weight:      Height:        Intake/Output Summary (Last 24 hours) at 07/11/2021 0959 Last data filed at 07/11/2021 0700 Gross per 24 hour  Intake 292.22 ml  Output 245 ml  Net 47.22 ml    Last 3 Weights 07/09/2021 07/09/2021 07/09/2021  Weight (lbs) 162 lb 14.7 oz 165 lb 5.5 oz 165 lb 5.5 oz  Weight (kg) 73.9 kg 75 kg 75 kg    Body mass index is 26.3 kg/m.  General: Well nourished, well developed, in no acute distress Head: Atraumatic, normal size  Eyes: PEERLA, EOMI  Neck: Supple, no JVD Endocrine: No thryomegaly Cardiac: Normal S1, S2; RRR; no murmurs, rubs, or gallops Lungs: Clear to  auscultation bilaterally, no wheezing, rhonchi or rales  Abd: Soft, nontender, no hepatomegaly  Ext: No edema, pulses 2+ Musculoskeletal: No deformities, BUE and BLE strength normal and equal Skin: Warm and dry, no rashes   Neuro: Alert and oriented to person, place, time, and situation, CNII-XII grossly intact, no focal deficits  Psych: Normal mood and affect   Labs  High Sensitivity Troponin:   Recent Labs  Lab 07/09/21 1317 07/09/21 1450 07/09/21 1900 07/09/21 2136 07/10/21 0506  TROPONINIHS 531* 588* 859* 804* 711*     Cardiac EnzymesNo results for input(s): TROPONINI in the last 168 hours. No results for input(s): TROPIPOC in the last 168 hours.  Chemistry Recent Labs  Lab 07/09/21 0750 07/09/21 0804 07/09/21 0856 07/10/21 0506 07/11/21 0630  NA 136 136 136 133* 135  K 4.0 4.1 3.9 3.1* 3.5  CL 96* 102  --  95* 94*  CO2 25  --   --  25 26  GLUCOSE 349* 362*  --  98 87   BUN 52* 52*  --  21 37*  CREATININE 6.78* 6.60*  --  4.25* 6.25*  CALCIUM 9.2  --   --  8.2* 8.4*  PROT 7.7  --   --   --   --   ALBUMIN 4.0  --   --  3.1* 2.7*  AST 22  --   --   --   --   ALT 16  --   --   --   --   ALKPHOS 81  --   --   --   --   BILITOT 0.9  --   --   --   --   GFRNONAA 6*  --   --  11* 7*  ANIONGAP 15  --   --  13 15    Hematology Recent Labs  Lab 07/09/21 0750 07/09/21 0804 07/09/21 0856 07/10/21 0506 07/11/21 0443  WBC 12.7*  --   --  7.0 7.6  RBC 3.91  --   --  3.14* 3.07*  HGB 11.5*   < > 11.9* 9.3* 8.9*  HCT 36.3   < > 35.0* 27.2* 28.3*  MCV 92.8  --   --  86.6 92.2  MCH 29.4  --   --  29.6 29.0  MCHC 31.7  --   --  34.2 31.4  RDW 15.0  --   --  15.2 15.3  PLT 311  --   --  227 218   < > = values in this interval not displayed.   BNP Recent Labs  Lab 07/09/21 1450  BNP 3,314.8*    DDimer No results for input(s): DDIMER in the last 168 hours.   Radiology  DG Abd Portable 1V  Result Date: 07/09/2021 CLINICAL DATA:  Nasogastric tube placement. EXAM: PORTABLE ABDOMEN - 1 VIEW COMPARISON:  Chest x-ray 07/09/2021. FINDINGS: Nasogastric tube tip terminates over the mid stomach. Central venous catheter tip projects over the distal SVC, unchanged. There are no dilated bowel loops in the upper abdomen. There is left basilar atelectasis/airspace disease. IMPRESSION: Nasogastric tube tip projects over the mid stomach. Electronically Signed   By: Ronney Asters M.D.   On: 07/09/2021 19:25   ECHOCARDIOGRAM LIMITED  Result Date: 07/09/2021    ECHOCARDIOGRAM LIMITED REPORT   Patient Name:   Melissa Howe Date of Exam: 07/09/2021 Medical Rec #:  [id number]         Height:       66.0 in Accession #:  [id number]        Weight:       165.3 lb Date of Birth:  [birth year]          BSA:          1.844 m Patient Age:    70 years          BP:           190/97 mmHg Patient Gender: F                 HR:           93 bpm. Exam Location:  Inpatient Procedure: 3D Echo,  Limited Echo, Cardiac Doppler and Color Doppler STAT ECHO Indications:    R06.02 SOB  History:        Patient has prior history of Echocardiogram examinations.                 Signs/Symptoms:Dizziness/Lightheadedness, Syncope and                 Bacteremia; Risk Factors:Hypertension, Diabetes and                 Dyslipidemia. Kidney disorder. Flash pulmonary edema.  Sonographer:    Roseanna Rainbow RDCS Referring Phys: [id number] Candee Furbish  Sonographer Comments: Echo performed with patient supine and on artificial respirator and Technically difficult study due to poor echo windows. IMPRESSIONS  1. Global hypokinesis worse in the anteroseptal and mid-apical anterior myocardium. Left ventricular ejection fraction, by estimation, is 35 to 40%. The left ventricle has moderately decreased function. The left ventricle demonstrates regional wall motion abnormalities (see scoring diagram/findings for description). Left ventricular diastolic parameters are consistent with Grade I diastolic dysfunction (impaired relaxation).  2. Right ventricular systolic function is normal. There is mildly elevated pulmonary artery systolic pressure.  3. The mitral valve is normal in structure. Mild mitral valve regurgitation. No evidence of mitral stenosis.  4. The aortic valve is tricuspid. Aortic valve regurgitation is not visualized. No aortic stenosis is present.  5. The inferior vena cava is dilated in size with <50% respiratory variability, suggesting right atrial pressure of 15 mmHg. Comparison(s): Compared with the echo 12/2019, the echo now shows worse systolic function in the anterior and anterosepal myocardium. Previously, the systolic dysfunction was similar globally. FINDINGS  Left Ventricle: Global hypokinesis worse in the anteroseptal and mid-apical anterior myocardium. Left ventricular ejection fraction, by estimation, is 35 to 40%. The left ventricle has moderately decreased function. The left ventricle demonstrates regional wall  motion abnormalities. The left ventricular internal cavity size was normal in size. There is no left ventricular hypertrophy. Left ventricular diastolic parameters are consistent with Grade I diastolic dysfunction (impaired relaxation). Right Ventricle: No increase in right ventricular wall thickness. Right ventricular systolic function is normal. There is mildly elevated pulmonary artery systolic pressure. The tricuspid regurgitant velocity is 2.37 m/s, and with an assumed right atrial  pressure of 15 mmHg, the estimated right ventricular systolic pressure is 24.4 mmHg. Mitral Valve: The mitral valve is normal in structure. Mild mitral annular calcification. Mild mitral valve regurgitation. No evidence of mitral valve stenosis. Tricuspid Valve: The tricuspid valve is normal in structure. Tricuspid valve regurgitation is trivial. No evidence of tricuspid stenosis. Aortic Valve: The aortic valve is tricuspid. Aortic valve regurgitation is not visualized. No aortic stenosis is present. Pulmonic Valve: The pulmonic valve was normal in structure. Pulmonic valve regurgitation is not visualized. No evidence of pulmonic stenosis. Aorta: The aortic root is normal  in size and structure. Venous: The inferior vena cava is dilated in size with less than 50% respiratory variability, suggesting right atrial pressure of 15 mmHg. LEFT VENTRICLE PLAX 2D LVIDd:         3.80 cm LVIDs:         3.20 cm LV PW:         0.92 cm LV IVS:        1.08 cm  LV Volumes (MOD) LV vol d, MOD A2C: 69.0 ml LV vol d, MOD A4C: 67.9 ml LV vol s, MOD A2C: 42.9 ml LV vol s, MOD A4C: 36.6 ml LV SV MOD A2C:     26.1 ml LV SV MOD A4C:     67.9 ml LV SV MOD BP:      27.9 ml IVC IVC diam: 2.10 cm LEFT ATRIUM         Index LA diam:    3.00 cm 1.63 cm/m   AORTA Ao Root diam: 2.90 cm Ao Asc diam:  3.00 cm TRICUSPID VALVE TR Peak grad:   22.5 mmHg TR Vmax:        237.42 cm/s Skeet Latch MD Electronically signed by Skeet Latch MD Signature Date/Time:  07/09/2021/12:12:27 PM    Final     Cardiac Studies  TTE 07/09/2021  1. Global hypokinesis worse in the anteroseptal and mid-apical anterior  myocardium. Left ventricular ejection fraction, by estimation, is 35 to  40%. The left ventricle has moderately decreased function. The left  ventricle demonstrates regional wall  motion abnormalities (see scoring diagram/findings for description). Left  ventricular diastolic parameters are consistent with Grade I diastolic  dysfunction (impaired relaxation).   2. Right ventricular systolic function is normal. There is mildly  elevated pulmonary artery systolic pressure.   3. The mitral valve is normal in structure. Mild mitral valve  regurgitation. No evidence of mitral stenosis.   4. The aortic valve is tricuspid. Aortic valve regurgitation is not  visualized. No aortic stenosis is present.   5. The inferior vena cava is dilated in size with <50% respiratory  variability, suggesting right atrial pressure of 15 mmHg.   Patient Profile  Melissa Howe is a 70 y.o. female with diabetes, ESRD on hemodialysis, hypertension who was admitted on 07/09/2021 with acute epoxy respiratory failure requiring intubation secondary to pulmonary edema.  Cardiology was consulted due to abnormal EKG and troponin elevation.  Also found to have newly reduced EF 35 to 40%.  Assessment & Plan   #Troponin elevation, demand ischemia versus non-STEMI #Hypertensive crisis #Pulmonary edema #Acute systolic heart failure, EF 35-40% with regional wall motion abnormalities -Admitted with hypertensive crisis and volume overload.  Underwent emergent dialysis. -Concerns for pneumonia but she has recovered quickly with dialysis.  Off antibiotics. -Troponins were minimally elevated.  BNP was 3000. -EKG did demonstrate dynamic T wave inversions in anterior lateral leads with minimal ST elevation in V2 and V3. -Echo shows EF 35 to 40% with wall motion abnormalities in the LAD  distribution. -I do not believe this is a primary ACS event but I do believe this was a secondary event in the setting of hypertensive crisis. -I am concerned for severe LAD stenosis. -We will continue aspirin and complete 48 hours of heparin therapy. -We will tentatively plan for left heart catheterization tomorrow. -I am a bit concerned about her anemia.  I have asked the critical care medicine team to determine if this is something that will preclude her from cardiac cath.  They  will evaluate her. -Coreg 6.25 mg twice daily was added.  Not a candidate for ACE/ARB/Arni/MRA given CKD.  We will add hydralazine and Imdur as we are able.  Volume status is managed by dialysis.  Shared Decision Making/Informed Consent The risks [stroke (1 in 1000), death (1 in 1000), kidney failure [usually temporary] (1 in 500), bleeding (1 in 200), allergic reaction [possibly serious] (1 in 200)], benefits (diagnostic support and management of coronary artery disease) and alternatives of a cardiac catheterization were discussed in detail with Ms. Dampier and she is willing to proceed.  #Anemia -Critical care medicine will evaluate her.  No obvious signs of bleeding.  Nothing to preclude her from cardiac cath at this time.  Tentatively plan for cardiac cath tomorrow.  If hemoglobin continues to decline we will have to delay this.  Any PCI would result in DAPT that cannot be stopped.  We do not want to be dealing with a GI bleed with antiplatelet agents that cannot be stopped.  For questions or updates, please contact [place] Please consult [email or web address] for contact info under   Time Spent with Patient: I have spent a total of 35 minutes with patient reviewing hospital notes, telemetry, EKGs, labs and examining the patient as well as establishing an assessment and plan that was discussed with the patient.  > 50% of time was spent in direct patient care.    Signed, Addison Naegeli. Audie Box, MD, [place]  07/11/2021 9:59 AM

## 2021-07-11 NOTE — Progress Notes (Signed)
HD tx completed without incident using L upper arm fistula. Net 1500 ml removed. BVP 48.5/ KeCN 206.  No concerns present.

## 2021-07-11 NOTE — Progress Notes (Signed)
OT Cancellation Note  Patient Details Name: Melissa Howe MRN: [id number] DOB: Nov 04, 1950   Cancelled Treatment:    Reason Eval/Treat Not Completed: Medical issues which prohibited therapy. RN reports bleeding from R triple lumen femoral line. OT to hold at this time. Plan to return as time allows.   Gloris Manchester OTR/L Supplemental OT, Department of rehab services [phone]  Felipe Drone 07/11/2021, 10:12 AM

## 2021-07-11 NOTE — Progress Notes (Signed)
NAME:  Melissa Howe, MRN:  [id number], DOB:  1951-09-28, LOS: 2 ADMISSION DATE:  07/09/2021, CONSULTATION DATE:  07/09/2021 REFERRING MD:  EDP CHIEF COMPLAINT:  Respiratory distress   History of Present Illness:  [name] is a 70 y/o female with a PMHx of ESRD (TTSa), T2DM, HFrEF (EF of 45%) who presents to the ED with c/o respiratory failure.   This patient is a 70 year old female with end-stage renal disease on dialysis, also known to have diabetes hypertension, she presented to dialysis this morning with some shortness of breath, it was not that bad so they hooked her up but did not start the dialysis session before she became severely short of breath diaphoretic and was noted to be severely hypertensive.  She was immediately transported by EMS to the hospital, the patient is not able to speak in more than 1 or 2 word sentences due to severe respiratory distress thus a level 5 caveat applies.  She is able to indicate to me that she has never had a heart attack, never had asthma, COPD or congestive heart failure to the best of her knowledge.  On examination, patient is intubated and sedated.   Pertinent  Medical History  ESRD on TTSa T2DM, insulin dependent  HTN HFrEF (45%)  Significant Hospital Events: Including procedures, antibiotic start and stop dates in addition to other pertinent events   9/13 >> Admitted for respiratory distress, intubated   Interim History / Subjective:   No acute overnight events.   This AM, Melissa Howe denies any acute complaints. She is feeling better.   Objective   Blood pressure 134/61, pulse 72, temperature 97.9 F (36.6 C), temperature source Oral, resp. rate 19, height '5\' 6"'$  (1.676 m), weight 73.9 kg, SpO2 93 %.    Vent Mode: PSV;CPAP FiO2 (%):  [40 %] 40 % PEEP:  [5 cmH20] 5 cmH20 Pressure Support:  [8 cmH20] 8 cmH20   Intake/Output Summary (Last 24 hours) at 07/11/2021 0736 Last data filed at 07/11/2021 0700 Gross per 24 hour   Intake 320.22 ml  Output 245 ml  Net 75.22 ml    Filed Weights   07/09/21 0700 07/09/21 1428 07/09/21 1825  Weight: 75 kg 75 kg 73.9 kg   Examination: General: No acute distress.  HENT: Mucus membranes moist.  Lungs: Clear to auscultation bilaterally. No increased work of breathing.  Cardiovascular: Regular rate and rhythm with no murmurs, rubs or gallops Abdomen: Soft and non-tender. Hypoactive bowel sounds.  Extremities: No pitting edema bilaterally. No rashes or lesions.  Neuro: Awake, alert and following commands. No focal deficits.   Resolved Hospital Problem list   Pulmonary Edema  Acute Respiratory Distress   Assessment & Plan:   Hypertensive Emergency  NSTEMI Inciting event unknown at this time. Cardiology consulted for dynamic EKG changes with new RWMA on echo. Plan for cath once stabilized, however they do not feel cardiac etiology behind HTN emergency and NSTEMI (ST changes in V2-3, T wave inversions in V4-6, peak trop 859). Differential also includes infectious etiology, as she has a previous history of similar symptoms with MSSA bacteremia in 2021.  Blood cultures negative thus far; will continue empiric coverage until negative for at least 48 hours.   - Continue Heparin gtt for 48 hours  - Continue Coreg 6.25 mg BID - Blood cultures pending - negative thus far - Continue Cefepime, Day 3  ESRD  - Nephrology following  Type 2 Diabetes Mellitus Uncontrolled with a1c of 8.8%. On Levemir  30 units at home. Euglycemic at this time.   - SSI  - Levemir 10 units at bedtime   HFrEF Echo on 9/13 with EF of 35-40% with regional wall motion abnormalities consistent with the LAD region. Etiology ischemic versus stress induced.  - Cardiology consulted; appreciate their recommendations - Continue Coreg 6.25 mg BID - Cath potentially later this admission   Normocytic Anemia  Low suspicion for bleed. Will monitor closely. Stable at this time.   - Trend hemoglobin    HLD LDL increased at 145. Goal < 70.   - Continue Atorvastatin to 80 mg daily    Best Practice (right click and "Reselect all SmartList Selections" daily)   Diet/type: NPO DVT prophylaxis: systemic heparin  GI prophylaxis: PPI Lines: Central line and yes and it is still needed Foley:  N/A Code Status:  full code Last date of multidisciplinary goals of care discussion [9/15]  Labs   CBC: Recent Labs  Lab 07/09/21 0750 07/09/21 0804 07/09/21 0856 07/10/21 0506 07/11/21 0443  WBC 12.7*  --   --  7.0 7.6  HGB 11.5* 12.6 11.9* 9.3* 8.9*  HCT 36.3 37.0 35.0* 27.2* 28.3*  MCV 92.8  --   --  86.6 92.2  PLT 311  --   --  227 [id number]    Basic Metabolic Panel: Recent Labs  Lab 07/09/21 0750 07/09/21 0804 07/09/21 0856 07/10/21 0506 07/11/21 0630  NA 136 136 136 133* 135  K 4.0 4.1 3.9 3.1* 3.5  CL 96* 102  --  95* 94*  CO2 25  --   --  25 26  GLUCOSE 349* 362*  --  98 87  BUN 52* 52*  --  21 37*  CREATININE 6.78* 6.60*  --  4.25* 6.25*  CALCIUM 9.2  --   --  8.2* 8.4*  PHOS  --   --   --  2.8 5.7*    GFR: Estimated Creatinine Clearance: 8.5 mL/min (A) (by C-G formula based on SCr of 6.25 mg/dL (H)). Recent Labs  Lab 07/09/21 0750 07/10/21 0506 07/10/21 0639 07/11/21 0443  WBC 12.7* 7.0  --  7.6  LATICACIDVEN 2.2*  --  1.0  --     Liver Function Tests: Recent Labs  Lab 07/09/21 0750 07/10/21 0506 07/11/21 0630  AST 22  --   --   ALT 16  --   --   ALKPHOS 81  --   --   BILITOT 0.9  --   --   PROT 7.7  --   --   ALBUMIN 4.0 3.1* 2.7*    No results for input(s): LIPASE, AMYLASE in the last 168 hours. No results for input(s): AMMONIA in the last 168 hours.  ABG    Component Value Date/Time   PHART 7.415 07/09/2021 0856   PCO2ART 39.6 07/09/2021 0856   PO2ART 267 (H) 07/09/2021 0856   HCO3 25.4 07/09/2021 0856   TCO2 27 07/09/2021 0856   O2SAT 99.6 07/09/2021 1620   Coagulation Profile: Recent Labs  Lab 07/09/21 0750  INR 1.0    Cardiac  Enzymes: No results for input(s): CKTOTAL, CKMB, CKMBINDEX, TROPONINI in the last 168 hours.  HbA1C: Hgb A1c MFr Bld  Date/Time Value Ref Range Status  07/09/2021 02:50 PM 8.8 (H) 4.8 - 5.6 % Final    Comment:    (NOTE) Pre diabetes:          5.7%-6.4%  Diabetes:              >  6.4%  Glycemic control for   <7.0% adults with diabetes   07/21/2020 01:27 PM 8.7 (H) 4.8 - 5.6 % Final    Comment:    (NOTE) Pre diabetes:          5.7%-6.4%  Diabetes:              >6.4%  Glycemic control for   <7.0% adults with diabetes    CBG: Recent Labs  Lab 07/10/21 1132 07/10/21 1600 07/10/21 1924 07/10/21 2348 07/11/21 0407  GLUCAP 144* 263* 168* 139* 104*     Review of Systems:   Negative except as noted above.   Past Medical History:  She,  has a past medical history of Anemia, Chronic kidney disease, Constipation, Diabetes mellitus, Dyspnea, History of blood transfusion, Hyperlipemia, Hypertension, and Pneumonia (2019).   Surgical History:   Past Surgical History:  Procedure Laterality Date   APPENDECTOMY     AV FISTULA PLACEMENT Left 10/05/2019   Procedure: INSERTION OF ARTERIOVENOUS (AV) GORE-TEX VASCULAR GRAFT LEFT ARM;  Surgeon: [name], MD;  Location: [place];  Service: Vascular;  Laterality: Left;   AV FISTULA PLACEMENT Left 02/27/2021   Procedure: LEFT ARM FIRST STAGE BASILIC VEIN  ARTERIOVENOUS (AV) FISTULA CREATION;  Surgeon: Waynetta Sandy, MD;  Location: [place];  Service: Vascular;  Laterality: Left;   [place] Left 04/17/2021   Procedure: LEFT SECOND STAGE [place];  Surgeon: Waynetta Sandy, MD;  Location: [place];  Service: Vascular;  Laterality: Left;   BIOPSY  07/24/2020   Procedure: BIOPSY;  Surgeon: Doran Stabler, MD;  Location: [place];  Service: Gastroenterology;;   BUBBLE STUDY  01/02/2020   Procedure: BUBBLE STUDY;  Surgeon: Elouise Munroe, MD;  Location: [facility] ENDOSCOPY;  Service:  Cardiology;;   COLONOSCOPY WITH PROPOFOL N/A 07/24/2020   Procedure: COLONOSCOPY WITH PROPOFOL;  Surgeon: Doran Stabler, MD;  Location: [place];  Service: Gastroenterology;  Laterality: N/A;   ESOPHAGOGASTRODUODENOSCOPY (EGD) WITH PROPOFOL N/A 07/24/2020   Procedure: ESOPHAGOGASTRODUODENOSCOPY (EGD) WITH PROPOFOL;  Surgeon: Doran Stabler, MD;  Location: [place];  Service: Gastroenterology;  Laterality: N/A;   EYE SURGERY Bilateral    Cataract   FOREIGN BODY REMOVAL Left 05/13/2018   Procedure: FOREIGN BODY REMOVAL left foot;  Surgeon: Meredith Pel, MD;  Location: [place];  Service: Orthopedics;  Laterality: Left;   I & D EXTREMITY Left 05/21/2018   Procedure: LEFT FOOT DEBRIDEMENT AND WOUND CLOSURE;  Surgeon: Newt Minion, MD;  Location: [place];  Service: Orthopedics;  Laterality: Left;   POLYPECTOMY  07/24/2020   Procedure: POLYPECTOMY;  Surgeon: Doran Stabler, MD;  Location: [place];  Service: Gastroenterology;;   TEE WITHOUT CARDIOVERSION N/A 01/02/2020   Procedure: TRANSESOPHAGEAL ECHOCARDIOGRAM (TEE);  Surgeon: Elouise Munroe, MD;  Location: [place];  Service: Cardiology;  Laterality: N/A;   TONSILLECTOMY     TUBAL LIGATION       Social History:   reports that she quit smoking about 37 years ago. Her smoking use included cigarettes. She has never used smokeless tobacco. She reports that she does not drink alcohol and does not use drugs.   Family History:  Her family history includes Diabetes in her mother; Hypertension in her mother and sister.   Allergies No Known Allergies   Home Medications  Prior to Admission medications   Medication Sig Start Date End Date Taking? Authorizing Provider  acetaminophen (TYLENOL) 500 MG tablet Take  500 mg by mouth every 6 (six) hours as needed for moderate pain or headache.    [provider]  amLODipine (NORVASC) 10 MG tablet Take 20 mg by mouth every evening.    [provider]  AURYXIA  1 GM 210 MG(Fe) tablet Take 420 mg by mouth See admin instructions. Take 2 tablets (420 mg) by mouth each meal and each snack 06/18/20   [provider]  calcitRIOL (ROCALTROL) 0.5 MCG capsule Take 5 capsules (2.5 mcg total) by mouth Every Tuesday,Thursday,and Saturday with dialysis. 07/24/20   Pokhrel, Corrie Mckusick, MD  Cinacalcet HCl (SENSIPAR PO) Take by mouth. 04/09/21 04/08/22  [provider]  HYDROcodone-acetaminophen (NORCO/VICODIN) 5-325 MG tablet Take 1 tablet by mouth every 4 (four) hours as needed for moderate pain. 04/17/21 04/17/22  Setzer, Edman Circle, PA-C  Insulin Detemir (LEVEMIR) 100 UNIT/ML Pen Inject 18 Units into the skin daily with breakfast. Patient taking differently: Inject 30 Units into the skin at bedtime. 08/11/19   Terrilee Croak, MD  Insulin Pen Needle 32G X 4 MM MISC Use with insulin pen to dispense insulin as directed 09/08/16   Tat, Shanon Brow, MD  iron sucrose in sodium chloride 0.9 % 100 mL Iron Sucrose (Venofer) 02/28/21 02/27/22  [provider]  lubiprostone (AMITIZA) 24 MCG capsule Take 24 mcg by mouth 2 (two) times daily as needed for constipation. 01/22/21   [provider]  Methoxy PEG-Epoetin Beta (MIRCERA IJ) Mircera 02/21/21 02/20/22  [provider]  multivitamin (RENA-VIT) TABS tablet Take 1 tablet by mouth every evening. 11/22/19   [provider]  VITAMIN D PO Take 4 tablets by mouth See admin instructions. Given at Dialysis 02/19/21   [provider]    Dr. Jose Persia Internal Medicine PGY-3 07/11/2021, 7:36 AM

## 2021-07-11 NOTE — Evaluation (Signed)
Physical Therapy Evaluation Patient Details Name: Melissa Howe MRN: [id number] DOB: November 24, 1950 Today's Date: 07/11/2021  History of Present Illness  70 yo admitted 9/13 with respiratory distress, HTN emergency and volume overload. Pt intubated 9/13-9/14. PMhx: DM, HTN, HLD, ESRD on HD TTS, neuropathy, retinopathy, depression, secondary hyperparathyroidism  Clinical Impression  Pt tolerated treatment well and moved well. Pt ambulated in hall and navigated stairs. Reported feeling SOB after stairs (with RR to 95, spO2 92% on RA) and unsteady without unilateral UE support. Pt's deficits include decreased balance. Will continue to follow acutely to improve balance deficits. Given pt moves well and has family support, does not require home services upon d/c.  Pre-vitals - HR 76, spO2 96% on RA , BP 108/86(93) RR increased to 45 with stairs, spO2 92% on RA       Recommendations for follow up therapy are one component of a multi-disciplinary discharge planning process, led by the attending physician.  Recommendations may be updated based on patient status, additional functional criteria and insurance authorization.  Follow Up Recommendations No PT follow up    Equipment Recommendations  None recommended by PT    Recommendations for Other Services       Precautions / Restrictions Precautions Precautions: Other (comment) Precaution Comments: Rt fem line on eval with CCS ok with mobility      Mobility  Bed Mobility Overal bed mobility: Modified Independent                  Transfers Overall transfer level: Needs assistance   Transfers: Sit to/from Stand Sit to Stand: Supervision         General transfer comment: supervision for lines only  Ambulation/Gait Ambulation/Gait assistance: Supervision Gait Distance (Feet): 150 Feet Assistive device: IV Pole;None Gait Pattern/deviations: Step-through pattern   Gait velocity interpretation: >4.37 ft/sec, indicative of  normal walking speed General Gait Details: Pt mentioned feeling unsteady. Ambulated initial 84' without equipment and felt more stable with IV pole the rest of way.  Stairs Stairs: Yes Stairs assistance: Min guard Stair Management: One rail Right;One rail Left Number of Stairs: 3 General stair comments: navigated 3 stairs x2. Initially ascended with R rail and descended with L rail. 2nd trial navigated with L rail. Min guard for safety with lines.  Wheelchair Mobility    Modified [place] (Stroke Patients Only)       Balance Overall balance assessment: Needs assistance   Sitting balance-Leahy Scale: Normal     Standing balance support: Single extremity supported;During functional activity Standing balance-Leahy Scale: Fair Standing balance comment: Pt able to maintain dynamic balance without AD; however, felt more steady with IV pole during ambulation                             Pertinent Vitals/Pain Pain Assessment: No/denies pain    Home Living Family/patient expects to be discharged to:: Private residence Living Arrangements: Children Available Help at Discharge: Family Type of Home: House Home Access: Level entry     Home Layout: Two level Home Equipment: None      Prior Function Level of Independence: Independent         Comments: daughter cooks at times; embroiders and makes Public affairs consultant Dominance   Dominant Hand: Right    Extremity/Trunk Assessment   Upper Extremity Assessment Upper Extremity Assessment: Overall WFL for tasks assessed    Lower Extremity Assessment Lower Extremity Assessment: Overall WFL for  tasks assessed    Cervical / Trunk Assessment Cervical / Trunk Assessment: Normal  Communication   Communication: No difficulties  Cognition Arousal/Alertness: Awake/alert Behavior During Therapy: WFL for tasks assessed/performed Overall Cognitive Status: Within Functional Limits for tasks assessed                                         General Comments General comments (skin integrity, edema, etc.): RR increased to 45 with stairs    Exercises     Assessment/Plan    PT Assessment Patient needs continued PT services  PT Problem List Decreased balance       PT Treatment Interventions DME instruction;Gait training;Stair training;Balance training;Therapeutic activities    PT Goals (Current goals can be found in the Care Plan section)  Acute Rehab PT Goals Patient Stated Goal: To go home PT Goal Formulation: With patient Time For Goal Achievement: 07/25/21 Potential to Achieve Goals: Good    Frequency Min 3X/week   Barriers to discharge        Co-evaluation               AM-PAC PT "6 Clicks" Mobility  Outcome Measure Help needed turning from your back to your side while in a flat bed without using bedrails?: None Help needed moving from lying on your back to sitting on the side of a flat bed without using bedrails?: None Help needed moving to and from a bed to a chair (including a wheelchair)?: A Little Help needed standing up from a chair using your arms (e.g., wheelchair or bedside chair)?: A Little Help needed to walk in hospital room?: A Little Help needed climbing 3-5 steps with a railing? : A Little 6 Click Score: 20    End of Session Equipment Utilized During Treatment: Gait belt Activity Tolerance: Patient tolerated treatment well Patient left: in bed;with nursing/sitter in room;with call bell/phone within reach Nurse Communication: Mobility status PT Visit Diagnosis: Unsteadiness on feet (R26.81)    Time: [id number] PT Time Calculation (min) (ACUTE ONLY): 34 min   Charges:   PT Evaluation $PT Eval Moderate Complexity: 1 Mod PT Treatments $Gait Training: 8-22 mins        Louie Casa, SPT Acute Rehab: [phone] [id number]   Domingo Dimes 07/11/2021, 8:49 AM

## 2021-07-11 NOTE — Progress Notes (Signed)
[facility] Progress Note Patient Name: Melissa Howe DOB: 12-04-1950 MRN: [id number]   Date of Service  07/11/2021  HPI/Events of Note  Pt on heparin gtt and is oozing from RIGHT femoral vein central venous catheter site  eICU Interventions  Clots are forming; bleeding is very slow and blood is dark; thrombin dressing to be applied; hep gtt to continue     Intervention Category Intermediate Interventions: Coagulopathy - evaluation and management  Tilden Dome 07/11/2021, 10:09 PM

## 2021-07-11 NOTE — Progress Notes (Signed)
[facility] Progress Note  Name: Melissa Howe MRN: [id number] DOB: [birth year]  Chief Complaint:  Shortness of breath   Subjective:  Last HD on 9/13 after arrival to MICU with UF of 1.1 kg (limited due to hypotension despite titration of sedation).  She had 125 ml UOP over 9/14.  She was extubated to room air in interim since my last exam.  Doing well. States they have used AVF for about 2-3 weeks      Review of systems:  Denies shortness of breath or chest pain Denies n/v ------  Background on consult:  Melissa Howe is a 70 y.o. 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 She has been on HD since January 2021.  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    Reviewed outpatient HD records. Patient reported mild SOB, stomach aches, and nausea/vomiting pre-HD today. After HD was initiated, her SOB worsened and EMS was called. Her last HD was on 9/10 where she received full treatment. Appears she has been compliant with outpatient HD treatments. Patient presented to the ED in acute respiratory distress and volume overloaded. She was found to be hypertensive with sys in 200s. CXR showed bilateral pulmonary edema. She was intubated for resp distress   Intake/Output Summary (Last 24 hours) at 07/11/2021 0557 Last data filed at 07/10/2021 2300 Gross per 24 hour  Intake 296.79 ml  Output 125 ml  Net 171.79 ml    Vitals:  Vitals:   07/10/21 2200 07/10/21 2300 07/10/21 2353 07/11/21 0412  BP: (!) 111/56 124/62    Pulse: 74 70    Resp: (!) 21 17    Temp:   98.3 F (36.8 C) 97.9 F (36.6 C)  TempSrc:   Oral Oral  SpO2: 98% 99%    Weight:      Height:         Physical Exam:   General elderly female in bed in NAD HEENT normocephalic atraumatic Neck trachea midline Lungs clear anteriorly normal work of breathing at rest on room air  Heart S1S2 no rub Abdomen soft nontender  nondistended Extremities no edema; no cyanosis or clubbing Neuro - sedation running; she is interactive and nods  Psych - normal mood and affect Access: left AVF with bruit and thrill; does have aspect with a high pitched bruit; RIJ tunn catheter   Medications reviewed   Labs:  BMP Latest Ref Rng & Units 07/10/2021 07/09/2021 07/09/2021  Glucose 70 - 99 mg/dL 98 - 362(H)  BUN 8 - 23 mg/dL 21 - 52(H)  Creatinine 0.44 - 1.00 mg/dL 4.25(H) - 6.60(H)  Sodium 135 - 145 mmol/L 133(L) 136 136  Potassium 3.5 - 5.1 mmol/L 3.1(L) 3.9 4.1  Chloride 98 - 111 mmol/L 95(L) - 102  CO2 22 - 32 mmol/L 25 - -  Calcium 8.9 - 10.3 mg/dL 8.2(L) - -    Dialysis Orders:  TTS - [facility]  4hrs, BFR 400, DFR Auto 1.5,  EDW 73.5kg, 2K/ 2Ca   Heparin 2000 bolus with HD Mircera 75 mcg q4wks - last 06/20/21 Calcitriol 56mg PO qHD-last 07/06/21 Venofer '50mg'$  IV weekly-last 07/04/21 Sensipar '90mg'$  with HD-last 07/06/21  Assessment/Plan:   # Acute hypoxic resp failure - flash pulm edema in setting of HTN emergency; also concern for PNA - now extubated - optimize volume with HD - abx per primary team discretion; they have sent blood cultures as well (NGTD).  Note tunn catheter and they have been using her AVF recently.  Retain catheter for now while awaiting data  # ESRD  - HD per TTS schedule  - no labs ordered for today - ordered renal panel today and daily - catheter in place and states they have been using AVF for a few weeks. Will contact unit and then plan for removal on 9/16 unless otherwise needed   # Acute systolic congestive heart failure - optimize volume with HD  # HTN emergency - improved control and labile  - had hypotension with attempted UF with HD and goal was lowered due to same  - controlled   # Hypokalemia - renal panel ordered  # Anemia CKD  - previously above goal for ESRD - aranesp 60 mcg for 9/15   Secondary hyperparathyroidism - trend phos; note on sensipar  outpt with HD as well as calcitriol.  Please do not include either med on discharge summary  Claudia Desanctis, MD 07/11/2021 6:10 AM

## 2021-07-12 ENCOUNTER — Encounter (HOSPITAL_COMMUNITY): Payer: Self-pay | Admitting: Interventional Cardiology

## 2021-07-12 ENCOUNTER — Encounter (HOSPITAL_COMMUNITY)
Admission: EM | Disposition: A | Discharge status: Home / Self Care | Payer: Self-pay | Source: Home / Self Care | Attending: Internal Medicine

## 2021-07-12 DIAGNOSIS — J9601 Acute respiratory failure with hypoxia: Secondary | ICD-10-CM

## 2021-07-12 DIAGNOSIS — I2510 Atherosclerotic heart disease of native coronary artery without angina pectoris: Secondary | ICD-10-CM

## 2021-07-12 DIAGNOSIS — N186 End stage renal disease: Secondary | ICD-10-CM

## 2021-07-12 DIAGNOSIS — J810 Acute pulmonary edema: Secondary | ICD-10-CM

## 2021-07-12 DIAGNOSIS — I169 Hypertensive crisis, unspecified: Secondary | ICD-10-CM

## 2021-07-12 DIAGNOSIS — I214 Non-ST elevation (NSTEMI) myocardial infarction: Secondary | ICD-10-CM

## 2021-07-12 HISTORY — PX: LEFT HEART CATH AND CORONARY ANGIOGRAPHY: CATH118249

## 2021-07-12 HISTORY — PX: CORONARY STENT INTERVENTION: CATH118234

## 2021-07-12 HISTORY — PX: INTRAVASCULAR ULTRASOUND/IVUS: CATH118244

## 2021-07-12 LAB — POCT ACTIVATED CLOTTING TIME
Activated Clotting Time: 254 seconds
Activated Clotting Time: 254 seconds

## 2021-07-12 LAB — GLUCOSE, CAPILLARY
Glucose-Capillary: 134 mg/dL — ABNORMAL HIGH (ref 70–99)
Glucose-Capillary: 118 mg/dL — ABNORMAL HIGH (ref 70–99)
Glucose-Capillary: 94 mg/dL (ref 70–99)
Glucose-Capillary: 240 mg/dL — ABNORMAL HIGH (ref 70–99)
Glucose-Capillary: 176 mg/dL — ABNORMAL HIGH (ref 70–99)

## 2021-07-12 LAB — CBC
HCT: 28.8 % — ABNORMAL LOW (ref 36.0–46.0)
Hemoglobin: 9.1 g/dL — ABNORMAL LOW (ref 12.0–15.0)
MCH: 29 pg (ref 26.0–34.0)
MCHC: 31.6 g/dL (ref 30.0–36.0)
MCV: 91.7 fL (ref 80.0–100.0)
Platelets: 226 10*3/uL (ref 150–400)
RBC: 3.14 MIL/uL — ABNORMAL LOW (ref 3.87–5.11)
RDW: 15.4 % (ref 11.5–15.5)
WBC: 7.9 10*3/uL (ref 4.0–10.5)
nRBC: 0 % (ref 0.0–0.2)

## 2021-07-12 LAB — RENAL FUNCTION PANEL
Albumin: 2.9 g/dL — ABNORMAL LOW (ref 3.5–5.0)
Anion gap: 13 (ref 5–15)
BUN: 21 mg/dL (ref 8–23)
CO2: 27 mmol/L (ref 22–32)
Calcium: 8.6 mg/dL — ABNORMAL LOW (ref 8.9–10.3)
Chloride: 95 mmol/L — ABNORMAL LOW (ref 98–111)
Creatinine, Ser: 4.64 mg/dL — ABNORMAL HIGH (ref 0.44–1.00)
GFR, Estimated: 10 mL/min — ABNORMAL LOW (ref >60)
Glucose, Bld: 135 mg/dL — ABNORMAL HIGH (ref 70–99)
Phosphorus: 3.7 mg/dL (ref 2.5–4.6)
Potassium: 3.5 mmol/L (ref 3.5–5.1)
Sodium: 135 mmol/L (ref 135–145)

## 2021-07-12 LAB — HEPARIN LEVEL (UNFRACTIONATED): Heparin Unfractionated: 0.17 IU/mL — ABNORMAL LOW (ref 0.30–0.70)

## 2021-07-12 SURGERY — LEFT HEART CATH AND CORONARY ANGIOGRAPHY
Anesthesia: LOCAL

## 2021-07-12 MED ORDER — HEPARIN (PORCINE) IN NACL 1000-0.9 UT/500ML-% IV SOLN
INTRAVENOUS | Status: AC
  Filled 2021-07-12: qty 1000

## 2021-07-12 MED ORDER — HYDRALAZINE HCL 20 MG/ML IJ SOLN
INTRAMUSCULAR | Status: AC
  Filled 2021-07-12: qty 1

## 2021-07-12 MED ORDER — SODIUM CHLORIDE 0.9 % IV SOLN: 250.0000 mL | INTRAVENOUS | Status: DC | PRN

## 2021-07-12 MED ORDER — HEPARIN SODIUM (PORCINE) 1000 UNIT/ML IJ SOLN
INTRAMUSCULAR | Status: AC
  Filled 2021-07-12: qty 1

## 2021-07-12 MED ORDER — TICAGRELOR 90 MG PO TABS
ORAL_TABLET | ORAL | Status: DC | PRN
  Administered 2021-07-12: 180 mg via ORAL

## 2021-07-12 MED ORDER — HEPARIN (PORCINE) IN NACL 1000-0.9 UT/500ML-% IV SOLN
INTRAVENOUS | Status: DC | PRN
  Administered 2021-07-12 (×2): 500 mL

## 2021-07-12 MED ORDER — SODIUM CHLORIDE 0.9% FLUSH: 3.0000 mL | INTRAVENOUS | Status: DC | PRN

## 2021-07-12 MED ORDER — CLOPIDOGREL BISULFATE 300 MG PO TABS
ORAL_TABLET | ORAL | Status: DC | PRN
  Administered 2021-07-12: 600 mg via ORAL

## 2021-07-12 MED ORDER — ONDANSETRON HCL 4 MG/2ML IJ SOLN
4.0000 mg | Freq: Four times a day (QID) | INTRAMUSCULAR | Status: DC | PRN
  Administered 2021-07-12: 4 mg via INTRAVENOUS

## 2021-07-12 MED ORDER — IOHEXOL 350 MG/ML SOLN
INTRAVENOUS | Status: DC | PRN
  Administered 2021-07-12: 140 mL

## 2021-07-12 MED ORDER — HYDRALAZINE HCL 25 MG PO TABS
25.0000 mg | ORAL_TABLET | Freq: Three times a day (TID) | ORAL | Status: DC
  Administered 2021-07-12 – 2021-07-13 (×4): 25 mg via ORAL
  Filled 2021-07-12 (×4): qty 1

## 2021-07-12 MED ORDER — TICAGRELOR 90 MG PO TABS
ORAL_TABLET | ORAL | Status: AC
  Filled 2021-07-12: qty 1

## 2021-07-12 MED ORDER — HEPARIN SODIUM (PORCINE) 5000 UNIT/ML IJ SOLN
5000.0000 [IU] | Freq: Three times a day (TID) | INTRAMUSCULAR | Status: DC
  Filled 2021-07-12 (×2): qty 1

## 2021-07-12 MED ORDER — LIDOCAINE HCL (PF) 1 % IJ SOLN
INTRAMUSCULAR | Status: AC
  Filled 2021-07-12: qty 30

## 2021-07-12 MED ORDER — HEPARIN SODIUM (PORCINE) 1000 UNIT/ML IJ SOLN
INTRAMUSCULAR | Status: DC | PRN
  Administered 2021-07-12: 5000 [IU] via INTRAVENOUS
  Administered 2021-07-12: 3000 [IU] via INTRAVENOUS
  Administered 2021-07-12: 4000 [IU] via INTRAVENOUS

## 2021-07-12 MED ORDER — CLOPIDOGREL BISULFATE 300 MG PO TABS
ORAL_TABLET | ORAL | Status: AC
  Filled 2021-07-12: qty 1

## 2021-07-12 MED ORDER — LIDOCAINE HCL (PF) 1 % IJ SOLN
INTRAMUSCULAR | Status: DC | PRN
  Administered 2021-07-12: 19 mL

## 2021-07-12 MED ORDER — LABETALOL HCL 5 MG/ML IV SOLN
10.0000 mg | Freq: Once | INTRAVENOUS | Status: DC
  Filled 2021-07-12: qty 4

## 2021-07-12 MED ORDER — ONDANSETRON HCL 4 MG/2ML IJ SOLN
INTRAMUSCULAR | Status: AC
  Filled 2021-07-12: qty 2

## 2021-07-12 MED ORDER — FENTANYL CITRATE (PF) 100 MCG/2ML IJ SOLN
INTRAMUSCULAR | Status: DC | PRN
  Administered 2021-07-12 (×2): 25 ug via INTRAVENOUS

## 2021-07-12 MED ORDER — HYDRALAZINE HCL 20 MG/ML IJ SOLN
10.0000 mg | INTRAMUSCULAR | Status: AC | PRN
  Administered 2021-07-12 (×2): 10 mg via INTRAVENOUS

## 2021-07-12 MED ORDER — MIDAZOLAM HCL 2 MG/2ML IJ SOLN
INTRAMUSCULAR | Status: AC
  Filled 2021-07-12: qty 2

## 2021-07-12 MED ORDER — LIDOCAINE-EPINEPHRINE 1 %-1:100000 IJ SOLN
INTRAMUSCULAR | Status: AC
  Filled 2021-07-12: qty 1

## 2021-07-12 MED ORDER — CHLORHEXIDINE GLUCONATE CLOTH 2 % EX PADS
6.0000 | MEDICATED_PAD | Freq: Every day | CUTANEOUS | Status: DC
  Administered 2021-07-13: 6 via TOPICAL

## 2021-07-12 MED ORDER — ASPIRIN 81 MG PO CHEW
81.0000 mg | CHEWABLE_TABLET | Freq: Every day | ORAL | Status: DC
  Administered 2021-07-13: 81 mg via ORAL
  Filled 2021-07-12: qty 1

## 2021-07-12 MED ORDER — LIDOCAINE-EPINEPHRINE 1 %-1:100000 IJ SOLN
30.0000 mL | Freq: Once | INTRAMUSCULAR | Status: AC
  Administered 2021-07-12: 0.5 mL

## 2021-07-12 MED ORDER — SODIUM CHLORIDE 0.9% FLUSH
3.0000 mL | Freq: Two times a day (BID) | INTRAVENOUS | Status: DC
  Administered 2021-07-12 – 2021-07-13 (×2): 3 mL via INTRAVENOUS

## 2021-07-12 MED ORDER — LABETALOL HCL 5 MG/ML IV SOLN: 10.0000 mg | INTRAVENOUS | Status: AC | PRN

## 2021-07-12 MED ORDER — CALCIUM CARBONATE ANTACID 500 MG PO CHEW: 2.0000 | CHEWABLE_TABLET | Freq: Four times a day (QID) | ORAL | Status: DC | PRN

## 2021-07-12 MED ORDER — ACETAMINOPHEN 325 MG PO TABS: 650.0000 mg | ORAL_TABLET | ORAL | Status: DC | PRN

## 2021-07-12 MED ORDER — CLOPIDOGREL BISULFATE 75 MG PO TABS
75.0000 mg | ORAL_TABLET | Freq: Every day | ORAL | Status: DC
  Administered 2021-07-13: 75 mg via ORAL
  Filled 2021-07-12: qty 1

## 2021-07-12 MED ORDER — FENTANYL CITRATE (PF) 100 MCG/2ML IJ SOLN
INTRAMUSCULAR | Status: AC
  Filled 2021-07-12: qty 2

## 2021-07-12 MED ORDER — ISOSORBIDE MONONITRATE ER 30 MG PO TB24
30.0000 mg | ORAL_TABLET | Freq: Every day | ORAL | Status: DC
  Administered 2021-07-13: 30 mg via ORAL
  Filled 2021-07-12: qty 1

## 2021-07-12 MED ORDER — NITROGLYCERIN 1 MG/10 ML FOR IR/CATH LAB
INTRA_ARTERIAL | Status: DC | PRN
  Administered 2021-07-12: 200 ug via INTRACORONARY

## 2021-07-12 MED ORDER — MIDAZOLAM HCL 2 MG/2ML IJ SOLN
INTRAMUSCULAR | Status: DC | PRN
  Administered 2021-07-12 (×2): 1 mg via INTRAVENOUS

## 2021-07-12 MED ORDER — NITROGLYCERIN 1 MG/10 ML FOR IR/CATH LAB
INTRA_ARTERIAL | Status: AC
  Filled 2021-07-12: qty 10

## 2021-07-12 MED ORDER — LIDOCAINE-EPINEPHRINE 1 %-1:100000 IJ SOLN
3.0000 mL | Freq: Once | INTRAMUSCULAR | Status: AC
  Administered 2021-07-12: 2 mL via INTRADERMAL

## 2021-07-12 SURGICAL SUPPLY — 24 items
BALLN EMERGE MR 2.5X12 (BALLOONS) ×2
BALLN SAPPHIRE ~~LOC~~ 2.75X8 (BALLOONS) ×2 IMPLANT
BALLN SAPPHIRE ~~LOC~~ 3.5X8 (BALLOONS) ×2 IMPLANT
BALLOON EMERGE MR 2.5X12 (BALLOONS) ×1 IMPLANT
CATH INFINITI 5FR MULTPACK ANG (CATHETERS) ×2 IMPLANT
CATH LAUNCHER 6FR EBU3.5 (CATHETERS) ×2 IMPLANT
CATH OPTICROSS HD (CATHETERS) ×2 IMPLANT
CLOSURE PERCLOSE PROSTYLE (VASCULAR PRODUCTS) ×2 IMPLANT
KIT ENCORE 26 ADVANTAGE (KITS) ×2 IMPLANT
KIT HEART LEFT (KITS) ×2 IMPLANT
KIT HEMO VALVE WATCHDOG (MISCELLANEOUS) ×2 IMPLANT
KIT MICROPUNCTURE NIT STIFF (SHEATH) ×2 IMPLANT
PACK CARDIAC CATHETERIZATION (CUSTOM PROCEDURE TRAY) ×2 IMPLANT
SHEATH PINNACLE 5F 10CM (SHEATH) ×2 IMPLANT
SHEATH PINNACLE 6F 10CM (SHEATH) ×2 IMPLANT
SHEATH PINNACLE 7F 10CM (SHEATH) IMPLANT
SHEATH PROBE COVER 6X72 (BAG) ×2 IMPLANT
SLED PULL BACK IVUS (MISCELLANEOUS) ×2 IMPLANT
STENT ONYX FRONTIER 2.5X12 (Permanent Stent) ×2 IMPLANT
STENT ONYX FRONTIER 3.0X12 (Permanent Stent) ×2 IMPLANT
TRANSDUCER W/STOPCOCK (MISCELLANEOUS) ×2 IMPLANT
TUBING CIL FLEX 10 FLL-RA (TUBING) ×2 IMPLANT
WIRE ASAHI PROWATER 180CM (WIRE) ×2 IMPLANT
WIRE EMERALD 3MM-J .035X260CM (WIRE) ×2 IMPLANT

## 2021-07-12 NOTE — Progress Notes (Signed)
PROGRESS NOTE        PATIENT DETAILS Name: Melissa Howe Age: 70 y.o. Sex: female Date of Birth: October 10, 1951 Admit Date: 07/09/2021 Admitting Physician Candee Furbish, MD GZF:POIPP, Malva Limes., FNP  Brief Narrative: Patient is a 70 y.o. female with a history of ESRD on HD TTS, HFrEF-who presented to the hospital with sudden onset of shortness of breath-she was found to have acute hypoxic respiratory failure requiring intubation in the setting of flash pulm edema, hypertensive emergency and possible non-STEMI.  See below for further details.  Subjective: Lying comfortably in bed-no chest pain or shortness of breath.  Objective: Vitals: Blood pressure (!) 143/65, pulse 75, temperature 98.2 F (36.8 C), temperature source Oral, resp. rate 17, height 5' 6"  (1.676 m), weight 74.9 kg, SpO2 98 %.   Physical exam: Gen Exam:Alert awake-not in any distress HEENT:atraumatic, normocephalic Chest: B/L clear to auscultation anteriorly CVS:S1S2 regular Abdomen:soft non tender, non distended Extremities:no edema Neurology: Non focal Skin: no rash   Pertinent labs/radiology Hb: 9.1 Creatinine: 4.64 TTE: EF 35-40%,+ regional wall motion abnormality   Assessment/Plan: Acute hypoxic respiratory failure due to flash pulm edema in the setting of hypertensive emergency/ESRD: Hypoxia has resolved-extubated on 9/14.  Continue supportive care  Non-STEMI: No chest pain or shortness of breath-on dual ASA/statin/Coreg and beta-blocker.  Cardiology planning LHC today.  HFrEF: Volume status stable-volume removal with HD.  ESRD on HD: Nephrology following and directing HD care.  Normocytic anemia: Due to CKD-no evidence of bleeding-add as per nephrology.    HTN: BP controlled-continue Coreg, hydralazine  HLD: Continue statin  DM-2 (A1c 8.8 on 9/13): CBG stable-continue Levemir 10 units nightly, and SSI.  Recent Labs    07/11/21 2345 07/12/21 0346 07/12/21 0808   GLUCAP 193* 134* 118*     Procedures : ETT:9/13>>9/14  Consults: PCCM, cards, renal DVT Prophylaxis :SCDs Start: 07/09/21 0900 Code Status:Full code  Family Communication:None at bedside  Disposition Plan: Status is: Inpatient Remains inpatient appropriate because:Inpatient level of care appropriate due to severity of illness  Dispo: The patient is from: Home              Anticipated d/c is to: Home              Patient currently is not medically stable to d/c.   Difficult to place patient No   Barriers to Discharge: Non-STEMI-LHC today-needs PT/OT eval prior to discharge.  Time spent: 35 minutes  Antimicrobial agents: Anti-infectives (From admission, onward)    Start     Dose/Rate Route Frequency Ordered Stop   07/10/21 0000  ceFEPIme (MAXIPIME) 2 g in sodium chloride 0.9 % 100 mL IVPB  Status:  Discontinued        2 g 200 mL/hr over 30 Minutes Intravenous Every T-Th-Sa (1800) 07/09/21 1600 07/11/21 1045   07/10/21 0000  vancomycin (VANCOREADY) IVPB 750 mg/150 mL  Status:  Discontinued        750 mg 150 mL/hr over 60 Minutes Intravenous Every T-Th-Sa (Hemodialysis) 07/09/21 1600 07/10/21 1152   07/09/21 1800  ceFEPIme (MAXIPIME) 2 g in sodium chloride 0.9 % 100 mL IVPB        2 g 200 mL/hr over 30 Minutes Intravenous  Once 07/09/21 1600 07/10/21 0908   07/09/21 1800  vancomycin (VANCOREADY) IVPB 1500 mg/300 mL        1,500  mg 150 mL/hr over 120 Minutes Intravenous  Once 07/09/21 1600 07/10/21 0909   07/09/21 0830  cefTRIAXone (ROCEPHIN) 1 g in sodium chloride 0.9 % 100 mL IVPB  Status:  Discontinued        1 g 200 mL/hr over 30 Minutes Intravenous  Once 07/09/21 0817 07/09/21 0931          MEDICATIONS: Scheduled Meds:  [MAR Hold] aspirin  81 mg Oral Daily   [MAR Hold] atorvastatin  80 mg Oral Daily   [MAR Hold] calcitRIOL  2 mcg Oral Q T,Th,Sa-HD   [MAR Hold] carvedilol  6.25 mg Oral BID WC   [MAR Hold] chlorhexidine gluconate (MEDLINE KIT)  15 mL Mouth  Rinse BID   [MAR Hold] Chlorhexidine Gluconate Cloth  6 each Topical Daily   [MAR Hold] Chlorhexidine Gluconate Cloth  6 each Topical Q0600   [MAR Hold] cinacalcet  90 mg Oral Q T,Th,Sa-HD   [MAR Hold] darbepoetin (ARANESP) injection - DIALYSIS  60 mcg Intravenous Q Thu-HD   [MAR Hold] docusate sodium  100 mg Oral BID   [MAR Hold] hydrALAZINE  25 mg Oral Q8H   [MAR Hold] insulin aspart  0-6 Units Subcutaneous Q4H   [MAR Hold] insulin detemir  10 Units Subcutaneous QHS   [MAR Hold] isosorbide mononitrate  30 mg Oral Daily   [MAR Hold] polyethylene glycol  17 g Oral Daily   [MAR Hold] sodium chloride flush  10-40 mL Intracatheter Q12H   sodium chloride flush  3 mL Intravenous Q12H   Continuous Infusions:  [MAR Hold] sodium chloride Stopped (07/11/21 0214)   sodium chloride     sodium chloride 10 mL/hr at 07/12/21 0700   PRN Meds:.[MAR Hold] sodium chloride, sodium chloride, [MAR Hold] acetaminophen, [MAR Hold] calcium carbonate, clopidogrel, [MAR Hold] docusate sodium, fentaNYL, Heparin (Porcine) in NaCl, heparin sodium (porcine), iohexol, lidocaine (PF), midazolam, nitroGLYCERIN, [MAR Hold] polyethylene glycol, [MAR Hold] sodium chloride flush, sodium chloride flush, ticagrelor   LABORATORY DATA: CBC: Recent Labs  Lab 07/09/21 0750 07/09/21 0804 07/09/21 0856 07/10/21 0506 07/11/21 0443 07/12/21 0405  WBC 12.7*  --   --  7.0 7.6 7.9  HGB 11.5* 12.6 11.9* 9.3* 8.9* 9.1*  HCT 36.3 37.0 35.0* 27.2* 28.3* 28.8*  MCV 92.8  --   --  86.6 92.2 91.7  PLT 311  --   --  227 218 062    Basic Metabolic Panel: Recent Labs  Lab 07/09/21 0750 07/09/21 0804 07/09/21 0856 07/10/21 0506 07/11/21 0630 07/12/21 0405  NA 136 136 136 133* 135 135  K 4.0 4.1 3.9 3.1* 3.5 3.5  CL 96* 102  --  95* 94* 95*  CO2 25  --   --  25 26 27   GLUCOSE 349* 362*  --  98 87 135*  BUN 52* 52*  --  21 37* 21  CREATININE 6.78* 6.60*  --  4.25* 6.25* 4.64*  CALCIUM 9.2  --   --  8.2* 8.4* 8.6*  PHOS  --    --   --  2.8 5.7* 3.7    GFR: Estimated Creatinine Clearance: 11.5 mL/min (A) (by C-G formula based on SCr of 4.64 mg/dL (H)).  Liver Function Tests: Recent Labs  Lab 07/09/21 0750 07/10/21 0506 07/11/21 0630 07/12/21 0405  AST 22  --   --   --   ALT 16  --   --   --   ALKPHOS 81  --   --   --   BILITOT 0.9  --   --   --  PROT 7.7  --   --   --   ALBUMIN 4.0 3.1* 2.7* 2.9*   No results for input(s): LIPASE, AMYLASE in the last 168 hours. No results for input(s): AMMONIA in the last 168 hours.  Coagulation Profile: Recent Labs  Lab 07/09/21 0750  INR 1.0    Cardiac Enzymes: No results for input(s): CKTOTAL, CKMB, CKMBINDEX, TROPONINI in the last 168 hours.  BNP (last 3 results) No results for input(s): PROBNP in the last 8760 hours.  Lipid Profile: Recent Labs    07/10/21 0506  CHOL 209*  HDL 41  LDLCALC 145*  TRIG 115  120  CHOLHDL 5.1    Thyroid Function Tests: Recent Labs    07/10/21 0506  TSH 2.367    Anemia Panel: No results for input(s): VITAMINB12, FOLATE, FERRITIN, TIBC, IRON, RETICCTPCT in the last 72 hours.  Urine analysis:    Component Value Date/Time   COLORURINE YELLOW 07/09/2021 1152   APPEARANCEUR CLEAR 07/09/2021 1152   LABSPEC 1.015 07/09/2021 1152   PHURINE 8.0 07/09/2021 1152   GLUCOSEU >=500 (A) 07/09/2021 1152   HGBUR NEGATIVE 07/09/2021 1152   BILIRUBINUR NEGATIVE 07/09/2021 1152   KETONESUR 5 (A) 07/09/2021 1152   PROTEINUR >=300 (A) 07/09/2021 1152   UROBILINOGEN 1.0 07/13/2015 1543   NITRITE NEGATIVE 07/09/2021 1152   LEUKOCYTESUR TRACE (A) 07/09/2021 1152    Sepsis Labs: Lactic Acid, Venous    Component Value Date/Time   LATICACIDVEN 1.0 07/10/2021 9323    MICROBIOLOGY: Recent Results (from the past 240 hour(s))  Resp Panel by RT-PCR (Flu A&B, Covid) Nasopharyngeal Swab     Status: None   Collection Time: 07/09/21  7:50 AM   Specimen: Nasopharyngeal Swab; Nasopharyngeal(NP) swabs in vial transport medium   Result Value Ref Range Status   SARS Coronavirus 2 by RT PCR NEGATIVE NEGATIVE Final    Comment: (NOTE) SARS-CoV-2 target nucleic acids are NOT DETECTED.  The SARS-CoV-2 RNA is generally detectable in upper respiratory specimens during the acute phase of infection. The lowest concentration of SARS-CoV-2 viral copies this assay can detect is 138 copies/mL. A negative result does not preclude SARS-Cov-2 infection and should not be used as the sole basis for treatment or other patient management decisions. A negative result may occur with  improper specimen collection/handling, submission of specimen other than nasopharyngeal swab, presence of viral mutation(s) within the areas targeted by this assay, and inadequate number of viral copies(<138 copies/mL). A negative result must be combined with clinical observations, patient history, and epidemiological information. The expected result is Negative.  Fact Sheet for Patients:  EntrepreneurPulse.com.au  Fact Sheet for Healthcare Providers:  IncredibleEmployment.be  This test is no t yet approved or cleared by the [place] FDA and  has been authorized for detection and/or diagnosis of SARS-CoV-2 by FDA under an Emergency Use Authorization (EUA). This EUA will remain  in effect (meaning this test can be used) for the duration of the COVID-19 declaration under Section 564(b)(1) of the Act, 21 U.S.C.section 360bbb-3(b)(1), unless the authorization is terminated  or revoked sooner.       Influenza A by PCR NEGATIVE NEGATIVE Final   Influenza B by PCR NEGATIVE NEGATIVE Final    Comment: (NOTE) The Xpert Xpress SARS-CoV-2/FLU/RSV plus assay is intended as an aid in the diagnosis of influenza from Nasopharyngeal swab specimens and should not be used as a sole basis for treatment. Nasal washings and aspirates are unacceptable for Xpert Xpress SARS-CoV-2/FLU/RSV testing.  Fact Sheet for  Patients:  EntrepreneurPulse.com.au  Fact Sheet for Healthcare Providers: IncredibleEmployment.be  This test is not yet approved or cleared by the [place] FDA and has been authorized for detection and/or diagnosis of SARS-CoV-2 by FDA under an Emergency Use Authorization (EUA). This EUA will remain in effect (meaning this test can be used) for the duration of the COVID-19 declaration under Section 564(b)(1) of the Act, 21 U.S.C. section 360bbb-3(b)(1), unless the authorization is terminated or revoked.  Performed at [facility], [place] [address], [place], [place] [address]   Blood Culture (routine x 2)     Status: None (Preliminary result)   Collection Time: 07/09/21  8:53 AM   Specimen: BLOOD  Result Value Ref Range Status   Specimen Description BLOOD SITE NOT SPECIFIED  Final   Special Requests   Final    BOTTLES DRAWN AEROBIC AND ANAEROBIC Blood Culture adequate volume   Culture   Final    NO GROWTH 3 DAYS Performed at [facility], [address], [place], [place] [address]    Report Status PENDING  Incomplete  Blood Culture (routine x 2)     Status: None (Preliminary result)   Collection Time: 07/09/21  9:10 AM   Specimen: BLOOD  Result Value Ref Range Status   Specimen Description BLOOD SITE NOT SPECIFIED  Final   Special Requests   Final    BOTTLES DRAWN AEROBIC ONLY Blood Culture results may not be optimal due to an inadequate volume of blood received in culture bottles   Culture   Final    NO GROWTH 3 DAYS Performed at [facility], [place] [address] [place], [place] [address]    Report Status PENDING  Incomplete  MRSA Next Gen by PCR, Nasal     Status: None   Collection Time: 07/09/21  8:55 PM   Specimen: Nasal Mucosa; Nasal Swab  Result Value Ref Range Status   MRSA by PCR Next Gen NOT DETECTED NOT DETECTED Final    Comment: (NOTE) The GeneXpert MRSA Assay (FDA approved for NASAL specimens only), is one  component of a comprehensive MRSA colonization surveillance program. It is not intended to diagnose MRSA infection nor to guide or monitor treatment for MRSA infections. Test performance is not FDA approved in patients less than 43 years old. Performed at [facility], [place] [address] [place], [place] [address]     RADIOLOGY STUDIES/RESULTS: No results found.   LOS: 3 days   Oren Binet, MD  [facility]    To contact the attending provider between 7A-7P or the covering provider during after hours 7P-7A, please log into the web site [email or web address] and access using universal [place] password for that web site. If you do not have the password, please call the hospital operator.  07/12/2021, 10:48 AM

## 2021-07-12 NOTE — Progress Notes (Signed)
ANTICOAGULATION CONSULT NOTE  Pharmacy Consult for heparin Indication: chest pain/ACS  No Known Allergies  Patient Measurements: Height: '5\' 6"'$  (167.6 cm) Weight: 74.9 kg (165 lb 2 oz) IBW/kg (Calculated) : 59.3 Heparin Dosing Weight: 75kg  Vital Signs: Temp: 98.2 F (36.8 C) (09/16 0350) Temp Source: Oral (09/16 0350) BP: 143/64 (09/15 2100) Pulse Rate: 73 (09/15 2100)  Labs: Recent Labs    07/09/21 0750 07/09/21 0804 07/09/21 0856 07/09/21 1317 07/09/21 1900 07/09/21 2136 07/10/21 0007 07/10/21 0506 07/10/21 1329 07/11/21 0443 07/11/21 0630 07/12/21 0405  HGB 11.5*   < > 11.9*  --   --   --   --  9.3*  --  8.9*  --  9.1*  HCT 36.3   < > 35.0*  --   --   --   --  27.2*  --  28.3*  --  28.8*  PLT 311  --   --   --   --   --   --  227  --  218  --  226  APTT  --   --  24  --   --   --   --   --   --   --   --   --   LABPROT 12.9  --   --   --   --   --   --   --   --   --   --   --   INR 1.0  --   --   --   --   --   --   --   --   --   --   --   HEPARINUNFRC  --   --   --   --   --   --    < >  --  0.31 0.43  --  0.17*  CREATININE 6.78*   < >  --   --   --   --   --  4.25*  --   --  6.25* 4.64*  TROPONINIHS  --   --   --    < > 859* 804*  --  711*  --   --   --   --    < > = values in this interval not displayed.     Estimated Creatinine Clearance: 11.5 mL/min (A) (by C-G formula based on SCr of 4.64 mg/dL (H)).   Medical History: Past Medical History:  Diagnosis Date   Anemia    Chronic kidney disease    Constipation    Diabetes mellitus    Type II   Dyspnea    with exertion   History of blood transfusion    Hyperlipemia    Hypertension    Pneumonia 2019    Assessment: 44 yoF with ESRD on HD admitted with hypertensive urgency. Trops bumped likely due to demand, covering with heparin for possible ACS for now. CBC wnl, no AC PTA.   Heparin level 0.17 is below goal on 950 units/hr, decreased from HL of 0.43 on 9/15. Hemoglobin is down but stable at  9.1. On 9/15 PM, patient was oozing dark blood slowly from right femoral vein central venous catheter site per chart review. Per RN, there was hardly any bleeding from the site at 2330 dressing change, and there are no issues with the infusion.  Per Cardiology - [facility].  Goal of Therapy:  Heparin level 0.3-0.7 units/ml Monitor platelets by  anticoagulation protocol: Yes   Plan:  Increase heparin rate by 3-4 units/kg/hr to 1200 units/hr until off for cath planned for 9AM.  Melanie Crazier, Student Pharmacist  Please refer to [facility] for [place] numbers 07/12/2021 5:06 AM

## 2021-07-12 NOTE — Progress Notes (Signed)
Patient transferred to cath lab this AM without complication.  Post cath patient will be transferred to another floor.   Per cathlab staff, patient will be going to [facility] [place] -report attempted x1 to [facility]. No open beds for patient at this time. Will attempt report again later.

## 2021-07-12 NOTE — Progress Notes (Signed)
Cardiology Progress Note  Patient ID: Melissa Howe MRN: [id number] DOB: [birth year] Date of Encounter: 07/12/2021  Primary Cardiologist: Evalina Field, MD  Subjective   Chief Complaint: None.  HPI: High-grade stenosis in the proximal to mid LAD as expected.  Status post PCI.  Good result.  Denies symptoms today.  Left femoral approach.  Perclose device used.  ROS:  All other ROS reviewed and negative. Pertinent positives noted in the HPI.     Inpatient Medications  Scheduled Meds:  [MAR Hold] aspirin  81 mg Oral Daily   [MAR Hold] atorvastatin  80 mg Oral Daily   [MAR Hold] calcitRIOL  2 mcg Oral Q T,Th,Sa-HD   [MAR Hold] carvedilol  6.25 mg Oral BID WC   [MAR Hold] chlorhexidine gluconate (MEDLINE KIT)  15 mL Mouth Rinse BID   [MAR Hold] Chlorhexidine Gluconate Cloth  6 each Topical Daily   [MAR Hold] Chlorhexidine Gluconate Cloth  6 each Topical Q0600   [MAR Hold] cinacalcet  90 mg Oral Q T,Th,Sa-HD   [START ON 07/13/2021] clopidogrel  75 mg Oral Q breakfast   [MAR Hold] darbepoetin (ARANESP) injection - DIALYSIS  60 mcg Intravenous Q Thu-HD   [MAR Hold] docusate sodium  100 mg Oral BID   [MAR Hold] hydrALAZINE  25 mg Oral Q8H   [MAR Hold] insulin aspart  0-6 Units Subcutaneous Q4H   [MAR Hold] insulin detemir  10 Units Subcutaneous QHS   [MAR Hold] isosorbide mononitrate  30 mg Oral Daily   [MAR Hold] polyethylene glycol  17 g Oral Daily   [MAR Hold] sodium chloride flush  10-40 mL Intracatheter Q12H   sodium chloride flush  3 mL Intravenous Q12H   Continuous Infusions:  [MAR Hold] sodium chloride Stopped (07/11/21 0214)   sodium chloride     sodium chloride 10 mL/hr at 07/12/21 0700   PRN Meds: [MAR Hold] sodium chloride, sodium chloride, [MAR Hold] acetaminophen, [MAR Hold] calcium carbonate, clopidogrel, [MAR Hold] docusate sodium, fentaNYL, Heparin (Porcine) in NaCl, heparin sodium (porcine), hydrALAZINE, iohexol, labetalol, lidocaine (PF), midazolam,  nitroGLYCERIN, ondansetron (ZOFRAN) IV, [MAR Hold] polyethylene glycol, [MAR Hold] sodium chloride flush, sodium chloride flush, ticagrelor   Vital Signs   Vitals:   07/12/21 1033 07/12/21 1038 07/12/21 1043 07/12/21 1111  BP: (!) 148/72 (!) 152/78 (!) 163/82 (!) 179/70  Pulse: 71 74 78 73  Resp: 15 17 (!) 28 18  Temp:      TempSrc:      SpO2: 97% 99% 99% 98%  Weight:      Height:        Intake/Output Summary (Last 24 hours) at 07/12/2021 1152 Last data filed at 07/12/2021 0700 Gross per 24 hour  Intake 478.04 ml  Output 1500 ml  Net -1021.96 ml   Last 3 Weights 07/12/2021 07/11/2021 07/09/2021  Weight (lbs) 165 lb 2 oz (No Data) 162 lb 14.7 oz  Weight (kg) 74.9 kg (No Data) 73.9 kg     Physical Exam   Vitals:   07/12/21 1033 07/12/21 1038 07/12/21 1043 07/12/21 1111  BP: (!) 148/72 (!) 152/78 (!) 163/82 (!) 179/70  Pulse: 71 74 78 73  Resp: 15 17 (!) 28 18  Temp:      TempSrc:      SpO2: 97% 99% 99% 98%  Weight:      Height:        Intake/Output Summary (Last 24 hours) at 07/12/2021 1152 Last data filed at 07/12/2021 0700 Gross per 24 hour  Intake 478.04  ml  Output 1500 ml  Net -1021.96 ml    Last 3 Weights 07/12/2021 07/11/2021 07/09/2021  Weight (lbs) 165 lb 2 oz (No Data) 162 lb 14.7 oz  Weight (kg) 74.9 kg (No Data) 73.9 kg    Body mass index is 26.65 kg/m.  General: Well nourished, well developed, in no acute distress Head: Atraumatic, normal size  Eyes: PEERLA, EOMI  Neck: Supple, no JVD Endocrine: No thryomegaly Cardiac: Normal S1, S2; RRR; no murmurs, rubs, or gallops Lungs: Clear to auscultation bilaterally, no wheezing, rhonchi or rales  Abd: Soft, nontender, no hepatomegaly  Ext: No edema, pulses 2+ Musculoskeletal: No deformities, BUE and BLE strength normal and equal Skin: Warm and dry, no rashes   Neuro: Alert and oriented to person, place, time, and situation, CNII-XII grossly intact, no focal deficits  Psych: Normal mood and affect   Labs   High Sensitivity Troponin:   Recent Labs  Lab 07/09/21 1317 07/09/21 1450 07/09/21 1900 07/09/21 2136 07/10/21 0506  TROPONINIHS 531* 588* 859* 804* 711*     Cardiac EnzymesNo results for input(s): TROPONINI in the last 168 hours. No results for input(s): TROPIPOC in the last 168 hours.  Chemistry Recent Labs  Lab 07/09/21 0750 07/09/21 0804 07/10/21 0506 07/11/21 0630 07/12/21 0405  NA 136   < > 133* 135 135  K 4.0   < > 3.1* 3.5 3.5  CL 96*   < > 95* 94* 95*  CO2 25  --  _0 GLUCOSE 349*   < > 98 87 135*  BUN 52*   < > 21 37* 21  CREATININE 6.78*   < > 4.25* 6.25* 4.64*  CALCIUM 9.2  --  8.2* 8.4* 8.6*  PROT 7.7  --   --   --   --   ALBUMIN 4.0  --  3.1* 2.7* 2.9*  AST 22  --   --   --   --   ALT 16  --   --   --   --   ALKPHOS 81  --   --   --   --   BILITOT 0.9  --   --   --   --   GFRNONAA 6*  --  11* 7* 10*  ANIONGAP 15  --  _1 < > = values in this interval not displayed.    Hematology Recent Labs  Lab 07/10/21 0506 07/11/21 0443 07/12/21 0405  WBC 7.0 7.6 7.9  RBC 3.14* 3.07* 3.14*  HGB 9.3* 8.9* 9.1*  HCT 27.2* 28.3* 28.8*  MCV 86.6 92.2 91.7  MCH 29.6 29.0 29.0  MCHC 34.2 31.4 31.6  RDW 15.2 15.3 15.4  PLT 227 218 226   BNP Recent Labs  Lab 07/09/21 1450  BNP 3,314.8*    DDimer No results for input(s): DDIMER in the last 168 hours.   Radiology  CARDIAC CATHETERIZATION  Result Date: 07/12/2021   Prox LAD to Mid LAD lesion is 80% stenosed.   A drug-eluting stent was successfully placed using a STENT ONYX FRONTIER 3.0X12, postdilated to 3.7 mm and optimized with intravascular ultrasound.   Post intervention, there is a 0% residual stenosis.   Mid LAD lesion is 75% stenosed.   A drug-eluting stent was successfully placed using a STENT ONYX FRONTIER 2.5X12, postdilated 2.75 mm and optimized with intravascular ultrasound.   Post intervention, there is a 0% residual stenosis.   LV end diastolic pressure is normal.   There  is no aortic  valve stenosis. Continue aggressive secondary prevention.  Dual antiplatelet therapy with presumably aspirin and Plavix given issues with anemia.  Brilinta may put her at higher risk of bleeding. Medical therapy for LV dysfunction.    Cardiac Studies  LHC 07/12/2021   Prox LAD to Mid LAD lesion is 80% stenosed.   A drug-eluting stent was successfully placed using a STENT ONYX FRONTIER 3.0X12, postdilated to 3.7 mm and optimized with intravascular ultrasound.   Post intervention, there is a 0% residual stenosis.   Mid LAD lesion is 75% stenosed.   A drug-eluting stent was successfully placed using a STENT ONYX FRONTIER 2.5X12, postdilated 2.75 mm and optimized with intravascular ultrasound.   Post intervention, there is a 0% residual stenosis.   LV end diastolic pressure is normal.   There is no aortic valve stenosis.   Continue aggressive secondary prevention.  Dual antiplatelet therapy with presumably aspirin and Plavix given issues with anemia.  Brilinta may put her at higher risk of bleeding.  TTE 07/09/2021  1. Global hypokinesis worse in the anteroseptal and mid-apical anterior  myocardium. Left ventricular ejection fraction, by estimation, is 35 to  40%. The left ventricle has moderately decreased function. The left  ventricle demonstrates regional wall  motion abnormalities (see scoring diagram/findings for description). Left  ventricular diastolic parameters are consistent with Grade I diastolic  dysfunction (impaired relaxation).   2. Right ventricular systolic function is normal. There is mildly  elevated pulmonary artery systolic pressure.   3. The mitral valve is normal in structure. Mild mitral valve  regurgitation. No evidence of mitral stenosis.   4. The aortic valve is tricuspid. Aortic valve regurgitation is not  visualized. No aortic stenosis is present.   5. The inferior vena cava is dilated in size with <50% respiratory  variability, suggesting right atrial pressure of  15 mmHg.   Patient Profile  Melissa Howe is a 70 y.o. female with diabetes, ESRD on hemodialysis, hypertension who was admitted on 07/09/2021 with acute epoxy respiratory failure requiring intubation secondary to pulmonary edema.  Cardiology was consulted due to abnormal EKG and troponin elevation.  Also found to have newly reduced EF 35 to 40%.  Assessment & Plan   #Non-STEMI #Hypertensive crisis  #pulmonary edema  #acute systolic heart failure, EF 35 to 40% -Left heart catheterization today demonstrated severe proximal LAD stenosis status post PCI. -She was admitted with hypertensive crisis and found to have new reduction in EF as well as dynamic anterior T wave inversions interfering for LAD ischemia.  Echo showed wall motion in that region as well. -Stop heparin.  Continue aspirin and Plavix for 1 year. -Continue high intensity statin. -She is on a beta-blocker. -Not a candidate for ACE/ARB/Arni/MRA given ESRD. -Hydralazine 25 mg 3 times daily has been added.  Imdur 30 mg daily.  Further titration as you are able. -No SGLT2 inhibitor given ESRD. -I will follow-up with her tomorrow for groin check as well as to arrange follow-ups.  She seems to be doing well.  For questions or updates, please contact [place] Please consult [email or web address] for contact info under   Time Spent with Patient: I have spent a total of 35 minutes with patient reviewing hospital notes, telemetry, EKGs, labs and examining the patient as well as establishing an assessment and plan that was discussed with the patient.  > 50% of time was spent in direct patient care.    Signed, Addison Naegeli. Audie Box, MD, [place]  [facility]  07/12/2021 11:52 AM

## 2021-07-12 NOTE — Progress Notes (Signed)
[facility] Progress Note  Name: Melissa Howe MRN: [id number] DOB: 1951/01/20  Chief Complaint:  Shortness of breath   Subjective:  Last HD on 9/15 with 1.5 kg UF.  She's still in the ICU. Going to cath lab today per rn.  Her outpt HD unit states doing fine with 15 g needles so can remove tunneled catheter  Review of systems:  Denies shortness of breath or chest pain Denies n/v ------  Background on consult:  Melissa Howe is a 70 y.o. 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 She has been on HD since January 2021.  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    Reviewed outpatient HD records. Patient reported mild SOB, stomach aches, and nausea/vomiting pre-HD today. After HD was initiated, her SOB worsened and EMS was called. Her last HD was on 9/10 where she received full treatment. Appears she has been compliant with outpatient HD treatments. Patient presented to the ED in acute respiratory distress and volume overloaded. She was found to be hypertensive with sys in 200s. CXR showed bilateral pulmonary edema. She was intubated for resp distress   Intake/Output Summary (Last 24 hours) at 07/12/2021 0617 Last data filed at 07/12/2021 0500 Gross per 24 hour  Intake 578.48 ml  Output 1760 ml  Net -1181.52 ml    Vitals:  Vitals:   07/12/21 0350 07/12/21 0400 07/12/21 0459 07/12/21 0500  BP:  (!) 142/72  134/63  Pulse:  74  66  Resp:  16  20  Temp: 98.2 F (36.8 C)     TempSrc: Oral     SpO2:  99%  97%  Weight:   74.9 kg   Height:         Physical Exam:   General elderly female in bed in NAD HEENT normocephalic atraumatic Neck supple trachea midline Lungs clear anteriorly normal work of breathing at rest on room air  Heart S1S2 no rub Abdomen soft nontender nondistended Extremities no edema; no cyanosis or clubbing Neuro - alert and oriented x 3 provides hx and follows  commands   Psych - normal mood and affect Access: left AVF with bruit and thrill; does have aspect with a high pitched bruit; old graft lower left arm; RIJ tunn catheter   Medications reviewed   Labs:  BMP Latest Ref Rng & Units 07/12/2021 07/11/2021 07/10/2021  Glucose 70 - 99 mg/dL 135(H) 87 98  BUN 8 - 23 mg/dL 21 37(H) 21  Creatinine 0.44 - 1.00 mg/dL 4.64(H) 6.25(H) 4.25(H)  Sodium 135 - 145 mmol/L 135 135 133(L)  Potassium 3.5 - 5.1 mmol/L 3.5 3.5 3.1(L)  Chloride 98 - 111 mmol/L 95(L) 94(L) 95(L)  CO2 22 - 32 mmol/L '27 26 25  '$ Calcium 8.9 - 10.3 mg/dL 8.6(L) 8.4(L) 8.2(L)    Dialysis Orders:  TTS - [place]  4hrs, BFR 400, DFR Auto 1.5,  EDW 73.5kg, 2K/ 2Ca   Heparin 2000 bolus with HD Mircera 75 mcg q4wks - last 06/20/21 Calcitriol 42mg PO qHD-last 07/06/21 Venofer '50mg'$  IV weekly-last 07/04/21 Sensipar '90mg'$  with HD-last 07/06/21  Assessment/Plan:   # Acute hypoxic resp failure - flash pulm edema in setting of HTN emergency; also concern for PNA - now extubated - optimize volume with HD - abx per primary team discretion; they have sent blood cultures which are NGTD  # ESRD  - HD per TTS schedule  - spoke with  HD unit - AVF is doing well on 15 g needles; they're ok with catheter coming out    # Acute systolic congestive heart failure - optimize volume with HD  # HTN emergency - now controlled; not all fluid mediated as didn't tolerate aggressive UF with HD on presentation  # Troponin elevation - NSTEMI vs. Demand ischemia - for cath today - Appreciate cardiology  # Hypokalemia - improved; defer repletion for 3.5 this am  # Anemia CKD  - previously above goal for ESRD - got aranesp 60 mcg 9/15 and is currently ordered every thurs   Secondary hyperparathyroidism - trend phos; note on sensipar outpt with HD as well as calcitriol.  Please do not include either med on discharge summary  Claudia Desanctis, MD 07/12/2021 6:34 AM

## 2021-07-12 NOTE — TOC Progression Note (Signed)
Transition of Care [facility]) - Progression Note    Patient Details  Name: Elberta Calia MRN: [id number] Date of Birth: [birth year]  Transition of Care [facility]) CM/SW Contact  Tom-Johnson, Renea Ee, RN Phone Number: 07/12/2021, 5:11 PM  Clinical Narrative:    CM got a call from Almedia Balls with [facility] and she states that she received the application for dialysis transportation faxed by CM and will get patient added in reservation list. Nurse to call reservation office at [phone] if patient is being discharged on the weekend to schedule transportation. CM called patient's daughter Crystal miles([phone]) and left her a secured message. CM will continue to follow with needs.     Barriers to Discharge: Continued Medical Work up  Expected Discharge Plan and Services     Discharge Planning Services: CM Consult   Living arrangements for the past 2 months: Single Family Home                                       Social Determinants of Health (SDOH) Interventions    Readmission Risk Interventions No flowsheet data found.

## 2021-07-12 NOTE — Interval H&P Note (Signed)
Cath Lab Visit (complete for each Cath Lab visit)  Clinical Evaluation Leading to the Procedure:   ACS: Yes.    Non-ACS:    Anginal Classification: CCS IV  Anti-ischemic medical therapy: Minimal Therapy (1 class of medications)  Non-Invasive Test Results: High-risk stress test findings: cardiac mortality >3%/year  Prior CABG: No previous CABG   EF 35-40%.   History and Physical Interval Note:  07/12/2021 8:49 AM  Melissa Howe  has presented today for surgery, with the diagnosis of nstemi.  The various methods of treatment have been discussed with the patient and family. After consideration of risks, benefits and other options for treatment, the patient has consented to  Procedure(s): LEFT HEART CATH AND CORONARY ANGIOGRAPHY (N/A) as a surgical intervention.  The patient's history has been reviewed, patient examined, no change in status, stable for surgery.  I have reviewed the patient's chart and labs.  Questions were answered to the patient's satisfaction.     Larae Grooms

## 2021-07-12 NOTE — Progress Notes (Signed)
Pt received from cath lab, noted drainage on femoral site dressing. Upon later assessment, dressing found with serosanguineous drainage and blood clots around site. RN paged PA Bhagat who came to bedside. Manual pressure held for 10 minutes. Cath lab called and came to bedside, and took over care.

## 2021-07-12 NOTE — Progress Notes (Signed)
OT Cancellation Note  Patient Details Name: Melissa Howe MRN: [id number] DOB: [birth year]   Cancelled Treatment:    Reason Eval/Treat Not Completed: Patient at procedure or test/ unavailable.  Off the floor at Cath Lab.  Continue efforts as appropriate.    [name] [name] 07/12/2021, 1:52 PM

## 2021-07-13 LAB — RENAL FUNCTION PANEL
Albumin: 2.7 g/dL — ABNORMAL LOW (ref 3.5–5.0)
Anion gap: 12 (ref 5–15)
BUN: 29 mg/dL — ABNORMAL HIGH (ref 8–23)
CO2: 25 mmol/L (ref 22–32)
Calcium: 9 mg/dL (ref 8.9–10.3)
Chloride: 98 mmol/L (ref 98–111)
Creatinine, Ser: 6.41 mg/dL — ABNORMAL HIGH (ref 0.44–1.00)
GFR, Estimated: 6 mL/min — ABNORMAL LOW (ref >60)
Glucose, Bld: 111 mg/dL — ABNORMAL HIGH (ref 70–99)
Phosphorus: 4.3 mg/dL (ref 2.5–4.6)
Potassium: 3.9 mmol/L (ref 3.5–5.1)
Sodium: 135 mmol/L (ref 135–145)

## 2021-07-13 LAB — CBC
HCT: 26.6 % — ABNORMAL LOW (ref 36.0–46.0)
Hemoglobin: 8.7 g/dL — ABNORMAL LOW (ref 12.0–15.0)
MCH: 29.7 pg (ref 26.0–34.0)
MCHC: 32.7 g/dL (ref 30.0–36.0)
MCV: 90.8 fL (ref 80.0–100.0)
Platelets: 248 10*3/uL (ref 150–400)
RBC: 2.93 MIL/uL — ABNORMAL LOW (ref 3.87–5.11)
RDW: 15.6 % — ABNORMAL HIGH (ref 11.5–15.5)
WBC: 8.5 10*3/uL (ref 4.0–10.5)
nRBC: 0 % (ref 0.0–0.2)

## 2021-07-13 LAB — GLUCOSE, CAPILLARY
Glucose-Capillary: 164 mg/dL — ABNORMAL HIGH (ref 70–99)
Glucose-Capillary: 198 mg/dL — ABNORMAL HIGH (ref 70–99)

## 2021-07-13 MED ORDER — HEPARIN SODIUM (PORCINE) 1000 UNIT/ML IJ SOLN
2.1000 mL | INTRAMUSCULAR | Status: DC | PRN
  Administered 2021-07-13: 2100 [IU] via INTRAVENOUS
  Filled 2021-07-13 (×2): qty 2.1
  Filled 2021-07-13: qty 3

## 2021-07-13 MED ORDER — ISOSORBIDE MONONITRATE ER 30 MG PO TB24: 30.0000 mg | ORAL_TABLET | Freq: Every day | ORAL | 1 refills | Status: DC

## 2021-07-13 MED ORDER — CARVEDILOL 6.25 MG PO TABS
6.2500 mg | ORAL_TABLET | Freq: Two times a day (BID) | ORAL | 1 refills | Status: DC
Start: 2021-07-13 — End: 2021-09-16

## 2021-07-13 MED ORDER — ASPIRIN 81 MG PO CHEW: 81.0000 mg | CHEWABLE_TABLET | Freq: Every day | ORAL | 1 refills | Status: DC

## 2021-07-13 MED ORDER — CLOPIDOGREL BISULFATE 75 MG PO TABS
75.0000 mg | ORAL_TABLET | Freq: Every day | ORAL | 1 refills | Status: DC
Start: 2021-07-13 — End: 2022-10-10

## 2021-07-13 MED ORDER — ATORVASTATIN CALCIUM 80 MG PO TABS: 80.0000 mg | ORAL_TABLET | Freq: Every day | ORAL | 1 refills | Status: DC

## 2021-07-13 MED ORDER — HYDRALAZINE HCL 25 MG PO TABS: 25.0000 mg | ORAL_TABLET | Freq: Three times a day (TID) | ORAL | 1 refills | Status: DC

## 2021-07-13 NOTE — Progress Notes (Signed)
Pt. L AVF had whistling sound upon ascultation. Made PA, Larina Earthly aware saw pt. At bedside ok to [place] HD catheter for treatment will have access evaluated.

## 2021-07-13 NOTE — Progress Notes (Signed)
OT Cancellation Note  Patient Details Name: Melissa Howe MRN: [id number] DOB: 03-12-1951   Cancelled Treatment:    Reason Eval/Treat Not Completed: Patient at procedure or test/ unavailable Pt off unit for HD this AM. Will follow-up for OT eval as schedule permits.   Layla Maw 07/13/2021, 8:49 AM

## 2021-07-13 NOTE — Progress Notes (Signed)
CARDIAC REHAB PHASE I   Stent education completed with pt. Pt educated on importance of ASA and Plavix. Reviewed site care, restrictions, and exercise guidelines. Deferred diet to dialysis center. Will refer to CRP II [place].  [id number] Rufina Falco, RN BSN 07/13/2021 12:44 PM

## 2021-07-13 NOTE — Progress Notes (Signed)
[facility] Progress Note   Subjective:  Seen in HD unit. Tolerating UF so far, increased goal to 3L. BP controlled. No cp, sob.  AVF with high pitched bruit. She elected to use cathter this am.   Objective Vitals:   07/12/21 2328 07/13/21 0459 07/13/21 0745 07/13/21 0838  BP: (!) 157/70 (!) 142/63 138/69 (!) 141/67  Pulse: 87 74 82   Resp: 19 (!) 22 16 16   Temp: 98 F (36.7 C) 98.4 F (36.9 C) (!) 97.5 F (36.4 C) 98.4 F (36.9 C)  TempSrc: Oral Oral Oral Oral  SpO2: 98% 98% 100%   Weight:  76.9 kg 76.3 kg   Height:        Additional Objective Labs: Basic Metabolic Panel: Recent Labs  Lab 07/11/21 0630 07/12/21 0405 07/13/21 0539  NA 135 135 135  K 3.5 3.5 3.9  CL 94* 95* 98  CO2 26 27 25   GLUCOSE 87 135* 111*  BUN 37* 21 29*  CREATININE 6.25* 4.64* 6.41*  CALCIUM 8.4* 8.6* 9.0  PHOS 5.7* 3.7 4.3   CBC: Recent Labs  Lab 07/09/21 0750 07/09/21 0804 07/10/21 0506 07/11/21 0443 07/12/21 0405 07/13/21 0539  WBC 12.7*  --  7.0 7.6 7.9 8.5  HGB 11.5*   < > 9.3* 8.9* 9.1* 8.7*  HCT 36.3   < > 27.2* 28.3* 28.8* 26.6*  MCV 92.8  --  86.6 92.2 91.7 90.8  PLT 311  --  227 218 226 248   < > = values in this interval not displayed.   Blood Culture    Component Value Date/Time   SDES BLOOD SITE NOT SPECIFIED 07/09/2021 0910   SPECREQUEST  07/09/2021 0910    BOTTLES DRAWN AEROBIC ONLY Blood Culture results may not be optimal due to an inadequate volume of blood received in culture bottles   CULT  07/09/2021 0910    NO GROWTH 4 DAYS Performed at [facility], [place] [address], [place], [place] [address]    REPTSTATUS PENDING 07/09/2021 3903     Physical Exam General: Well appearing, nad  Heart: RRR No m,r,g Lungs: Clear, anteriorly  Abdomen: soft non-tender  Extremities: No significant LE edema  Dialysis Access: LUE AVF +bruit (high pitched near proximal aspect) R IJ TDC in use on HD   Medications:  sodium chloride Stopped  (07/11/21 0214)   sodium chloride      aspirin  81 mg Oral Daily   atorvastatin  80 mg Oral Daily   calcitRIOL  2 mcg Oral Q T,Th,Sa-HD   carvedilol  6.25 mg Oral BID WC   chlorhexidine gluconate (MEDLINE KIT)  15 mL Mouth Rinse BID   Chlorhexidine Gluconate Cloth  6 each Topical Q0600   cinacalcet  90 mg Oral Q T,Th,Sa-HD   clopidogrel  75 mg Oral Q breakfast   darbepoetin (ARANESP) injection - DIALYSIS  60 mcg Intravenous Q Thu-HD   docusate sodium  100 mg Oral BID   heparin  5,000 Units Subcutaneous Q8H   hydrALAZINE  25 mg Oral Q8H   insulin aspart  0-6 Units Subcutaneous Q4H   insulin detemir  10 Units Subcutaneous QHS   isosorbide mononitrate  30 mg Oral Daily   labetalol  10 mg Intravenous Once   polyethylene glycol  17 g Oral Daily   sodium chloride flush  10-40 mL Intracatheter Q12H   sodium chloride flush  3 mL Intravenous Q12H    Dialysis Orders:  TTS - [facility]  4hrs, BFR 400, DFR Auto 1.5,  EDW 73.5kg, 2K/ 2Ca   Heparin 2000 bolus with HD Mircera 75 mcg q4wks - last 06/20/21 Calcitriol 52mg PO qHD-last 07/06/21 Venofer 530mIV weekly-last 07/04/21 Sensipar 9027mith HD-last 07/06/21  Background on consult:  Melissa Howe a [age]o. 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    Reviewed outpatient HD records. Patient reported mild SOB, stomach aches, and nausea/vomiting pre-HD today. After HD was initiated, her SOB worsened and EMS was called. Her last HD was on 9/10 where she received full treatment. Appears she has been compliant with outpatient HD treatments. Patient presented to the ED in acute respiratory distress and volume overloaded. She was found to be hypertensive with sys in 200s. CXR showed bilateral pulmonary edema. She was intubated for resp distress  Assessment/Plan: # Acute  hypoxic resp failure - flash pulm edema in setting of HTN emergency; also concern for PNA - now extubated - optimize volume with HD --Try to UF to dry weight with HD today.  - abx per primary team discretion; they have sent blood cultures which are NGTD   # ESRD  - HD per TTS schedule.  - spoke with HD unit - AVF has been doing well on 15 g needles; they're ok with catheter coming out    # Acute systolic congestive heart failure - optimize volume with HD   # HTN emergency - now controlled; not all fluid mediated as didn't tolerate aggressive UF with HD on presentation   # Troponin elevation - NSTEMI vs. Demand ischemia - for cath today - Appreciate cardiology   # Hypokalemia - improved;   # Anemia CKD  - previously above goal for ESRD - got aranesp 60 mcg 9/15 and is currently ordered every thurs   Secondary hyperparathyroidism - trend phos; note on sensipar outpt with HD as well as calcitriol.  Please do not include either med on discharge summary  [place]17/2022,9:09 AM

## 2021-07-13 NOTE — Progress Notes (Signed)
Cardiology Progress Note  Patient ID: Nicole Defino MRN: [id number] DOB: 01/25/1951 Date of Encounter: 07/13/2021  Primary Cardiologist: Evalina Field, MD  Subjective   Chief Complaint: None.  HPI: Oozing noted by nursing overnight at the left femoral cath site.  Hemoglobin is stable.  There is no evidence of bleeding on my examination.  She has a good 2+ pulse.  No bruit.  ROS:  All other ROS reviewed and negative. Pertinent positives noted in the HPI.     Inpatient Medications  Scheduled Meds:  aspirin  81 mg Oral Daily   atorvastatin  80 mg Oral Daily   calcitRIOL  2 mcg Oral Q T,Th,Sa-HD   carvedilol  6.25 mg Oral BID WC   chlorhexidine gluconate (MEDLINE KIT)  15 mL Mouth Rinse BID   Chlorhexidine Gluconate Cloth  6 each Topical Q0600   cinacalcet  90 mg Oral Q T,Th,Sa-HD   clopidogrel  75 mg Oral Q breakfast   darbepoetin (ARANESP) injection - DIALYSIS  60 mcg Intravenous Q Thu-HD   docusate sodium  100 mg Oral BID   heparin  5,000 Units Subcutaneous Q8H   hydrALAZINE  25 mg Oral Q8H   insulin aspart  0-6 Units Subcutaneous Q4H   insulin detemir  10 Units Subcutaneous QHS   isosorbide mononitrate  30 mg Oral Daily   labetalol  10 mg Intravenous Once   polyethylene glycol  17 g Oral Daily   sodium chloride flush  10-40 mL Intracatheter Q12H   sodium chloride flush  3 mL Intravenous Q12H   Continuous Infusions:  sodium chloride Stopped (07/11/21 0214)   sodium chloride     PRN Meds: sodium chloride, sodium chloride, acetaminophen, calcium carbonate, docusate sodium, ondansetron (ZOFRAN) IV, polyethylene glycol, sodium chloride flush, sodium chloride flush   Vital Signs   Vitals:   07/12/21 2243 07/12/21 2328 07/13/21 0459 07/13/21 0745  BP: (!) 144/72 (!) 157/70 (!) 142/63 138/69  Pulse:  87 74 82  Resp:  19 (!) 22 16  Temp:  98 F (36.7 C) 98.4 F (36.9 C) (!) 97.5 F (36.4 C)  TempSrc:  Oral Oral Oral  SpO2:  98% 98% 100%  Weight:   76.9 kg    Height:        Intake/Output Summary (Last 24 hours) at 07/13/2021 0818 Last data filed at 07/13/2021 0700 Gross per 24 hour  Intake 120 ml  Output 500 ml  Net -380 ml   Last 3 Weights 07/13/2021 07/12/2021 07/11/2021  Weight (lbs) 169 lb 8.5 oz 165 lb 2 oz (No Data)  Weight (kg) 76.9 kg 74.9 kg (No Data)      Telemetry  Overnight telemetry shows sinus rhythm 70s, which I personally reviewed.   ECG  The most recent ECG shows sinus rhythm, inferior T wave inversions, which I personally reviewed.   Physical Exam   Vitals:   07/12/21 2243 07/12/21 2328 07/13/21 0459 07/13/21 0745  BP: (!) 144/72 (!) 157/70 (!) 142/63 138/69  Pulse:  87 74 82  Resp:  19 (!) 22 16  Temp:  98 F (36.7 C) 98.4 F (36.9 C) (!) 97.5 F (36.4 C)  TempSrc:  Oral Oral Oral  SpO2:  98% 98% 100%  Weight:   76.9 kg   Height:        Intake/Output Summary (Last 24 hours) at 07/13/2021 0818 Last data filed at 07/13/2021 0700 Gross per 24 hour  Intake 120 ml  Output 500 ml  Net -380 ml  Last 3 Weights 07/13/2021 07/12/2021 07/11/2021  Weight (lbs) 169 lb 8.5 oz 165 lb 2 oz (No Data)  Weight (kg) 76.9 kg 74.9 kg (No Data)    Body mass index is 27.36 kg/m.   General: Well nourished, well developed, in no acute distress Head: Atraumatic, normal size  Eyes: PEERLA, EOMI  Neck: Supple, no JVD Endocrine: No thryomegaly Cardiac: Normal S1, S2; RRR; no murmurs, rubs, or gallops Lungs: Clear to auscultation bilaterally, no wheezing, rhonchi or rales  Abd: Soft, nontender, no hepatomegaly  Ext: No edema, pulses 2+, left femoral cath site clean and dry, 2+ pulse, no hematoma or bruit Musculoskeletal: No deformities, BUE and BLE strength normal and equal Skin: Warm and dry, no rashes   Neuro: Alert and oriented to person, place, time, and situation, CNII-XII grossly intact, no focal deficits  Psych: Normal mood and affect   Labs  High Sensitivity Troponin:   Recent Labs  Lab 07/09/21 1317  07/09/21 1450 07/09/21 1900 07/09/21 2136 07/10/21 0506  TROPONINIHS 531* 588* 859* 804* 711*     Cardiac EnzymesNo results for input(s): TROPONINI in the last 168 hours. No results for input(s): TROPIPOC in the last 168 hours.  Chemistry Recent Labs  Lab 07/09/21 0750 07/09/21 0804 07/11/21 0630 07/12/21 0405 07/13/21 0539  NA 136   < > 135 135 135  K 4.0   < > 3.5 3.5 3.9  CL 96*   < > 94* 95* 98  CO2 25   < > 26 27 25   GLUCOSE 349*   < > 87 135* 111*  BUN 52*   < > 37* 21 29*  CREATININE 6.78*   < > 6.25* 4.64* 6.41*  CALCIUM 9.2   < > 8.4* 8.6* 9.0  PROT 7.7  --   --   --   --   ALBUMIN 4.0   < > 2.7* 2.9* 2.7*  AST 22  --   --   --   --   ALT 16  --   --   --   --   ALKPHOS 81  --   --   --   --   BILITOT 0.9  --   --   --   --   GFRNONAA 6*   < > 7* 10* 6*  ANIONGAP 15   < > 15 13 12    < > = values in this interval not displayed.    Hematology Recent Labs  Lab 07/11/21 0443 07/12/21 0405 07/13/21 0539  WBC 7.6 7.9 8.5  RBC 3.07* 3.14* 2.93*  HGB 8.9* 9.1* 8.7*  HCT 28.3* 28.8* 26.6*  MCV 92.2 91.7 90.8  MCH 29.0 29.0 29.7  MCHC 31.4 31.6 32.7  RDW 15.3 15.4 15.6*  PLT 218 226 248   BNP Recent Labs  Lab 07/09/21 1450  BNP 3,314.8*    DDimer No results for input(s): DDIMER in the last 168 hours.   Radiology  CARDIAC CATHETERIZATION  Result Date: 07/12/2021   Prox LAD to Mid LAD lesion is 80% stenosed.   A drug-eluting stent was successfully placed using a STENT ONYX FRONTIER 3.0X12, postdilated to 3.7 mm and optimized with intravascular ultrasound.   Post intervention, there is a 0% residual stenosis.   Mid LAD lesion is 75% stenosed.   A drug-eluting stent was successfully placed using a STENT ONYX FRONTIER 2.5X12, postdilated 2.75 mm and optimized with intravascular ultrasound.   Post intervention, there is a 0% residual stenosis.   LV end  diastolic pressure is normal.   There is no aortic valve stenosis. Continue aggressive secondary prevention.   Dual antiplatelet therapy with presumably aspirin and Plavix given issues with anemia.  Brilinta may put her at higher risk of bleeding. Medical therapy for LV dysfunction.    Cardiac Studies  LHC 07/12/2021   Prox LAD to Mid LAD lesion is 80% stenosed.   A drug-eluting stent was successfully placed using a STENT ONYX FRONTIER 3.0X12, postdilated to 3.7 mm and optimized with intravascular ultrasound.   Post intervention, there is a 0% residual stenosis.   Mid LAD lesion is 75% stenosed.   A drug-eluting stent was successfully placed using a STENT ONYX FRONTIER 2.5X12, postdilated 2.75 mm and optimized with intravascular ultrasound.   Post intervention, there is a 0% residual stenosis.   LV end diastolic pressure is normal.   There is no aortic valve stenosis.  TTE 07/09/2021   1. Global hypokinesis worse in the anteroseptal and mid-apical anterior  myocardium. Left ventricular ejection fraction, by estimation, is 35 to  40%. The left ventricle has moderately decreased function. The left  ventricle demonstrates regional wall  motion abnormalities (see scoring diagram/findings for description). Left  ventricular diastolic parameters are consistent with Grade I diastolic  dysfunction (impaired relaxation).   2. Right ventricular systolic function is normal. There is mildly  elevated pulmonary artery systolic pressure.   3. The mitral valve is normal in structure. Mild mitral valve  regurgitation. No evidence of mitral stenosis.   4. The aortic valve is tricuspid. Aortic valve regurgitation is not  visualized. No aortic stenosis is present.   5. The inferior vena cava is dilated in size with <50% respiratory  variability, suggesting right atrial pressure of 15 mmHg.   Patient Profile  Sherry Blackard is a 70 y.o. female with diabetes, ESRD on hemodialysis, hypertension who was admitted on 07/09/2021 with acute epoxy respiratory failure requiring intubation secondary to pulmonary edema.   Cardiology was consulted due to abnormal EKG and troponin elevation.  Also found to have newly reduced EF 35 to 40%.  Assessment & Plan   #Non-STEMI -Status post PCI to the proximal LAD yesterday.  Left femoral cath site is clean and dry.  No bruit. -She was given strict precautions.  No driving for 1 week.  No heavy lifting for 1 week. -We will continue aspirin and Plavix.  Please discharge her on this.  This will be for 1 year. -Please continue her high intensity statin at discharge.  Further titration of lipids at follow-up. -She will be discharged on a beta-blocker.  #Ischemic cardiomyopathy, 35-40% -Volume status maintained by dialysis. -Not a candidate for ACE/ARB/Arni/MRA given ESRD. -Continue Coreg 6.25 mg twice daily, hydralazine 25 mg 3 times daily, Imdur 30 mg daily at discharge.  [facility] will sign off.   Medication Recommendations: Medications as above Other recommendations (labs, testing, etc): None Follow up as an outpatient: We will arrange outpatient follow-up in 1 to 2 weeks.  For questions or updates, please contact [place] Please consult [email or web address] for contact info under   Time Spent with Patient: I have spent a total of 35 minutes with patient reviewing hospital notes, telemetry, EKGs, labs and examining the patient as well as establishing an assessment and plan that was discussed with the patient.  > 50% of time was spent in direct patient care.    Signed, Addison Naegeli. Audie Box, MD, [place]  07/13/2021 8:18 AM

## 2021-07-13 NOTE — Discharge Summary (Addendum)
PATIENT DETAILS Name: Melissa Howe Age: 70 y.o. Sex: female Date of Birth: 07/21/1951 MRN: [id number]. Admitting Physician: Candee Furbish, MD [id number], Malva Limes., FNP  Admit Date: 07/09/2021 Discharge date: 07/13/2021  Recommendations for Outpatient Follow-up:  Follow up with PCP in 1-2 weeks Please obtain CMP/CBC in one week Please ensure follow-up with cardiology and nephrology.  Admitted From:  Home  Disposition: [place]: No  Equipment/Devices: None  Discharge Condition: Stable  CODE STATUS: FULL CODE  Diet recommendation:  Diet Order             Diet - low sodium heart healthy           Diet Carb Modified           Diet renal/carb modified with fluid restriction Diet-HS Snack? Nothing; Fluid restriction: 1200 mL Fluid; Room service appropriate? Yes; Fluid consistency: Thin  Diet effective now                    Brief Summary: Patient is a 70 y.o. female with a history of ESRD on HD TTS, HFrEF-who presented to the hospital with sudden onset of shortness of breath-she was found to have acute hypoxic respiratory failure requiring intubation in the setting of flash pulm edema, hypertensive emergency and possible non-STEMI.  See below for further details.    Brief Hospital Course: Acute hypoxic respiratory failure due to flash pulm edema in the setting of hypertensive emergency/ESRD: Hypoxia has resolved-extubated on 9/14.  Continue supportive care   Non-STEMI: S/p LHC with PCI to LAD.  Cardiology recommending aspirin/Plavix/Coreg/statin on discharge.  Please follow-up with cardiology on discharge.   HFrEF: Volume status stable-volume removal with HD.     ESRD on HD: Nephrology following and directing HD care.   Normocytic anemia: Due to CKD-no evidence of bleeding-Aranesp per nephrology.   HTN: BP controlled-continue Coreg, hydralazine   HLD: Continue statin   DM-2 (A1c 8.8 on 9/13): CBG stable-continue Levemir 10 units nightly,  and SSI.    Procedures 9/16>>LHC:   Prox LAD to Mid LAD lesion is 80% stenosed.   A drug-eluting stent was successfully placed using a STENT ONYX FRONTIER 3.0X12, postdilated to 3.7 mm and optimized with intravascular ultrasound.   Post intervention, there is a 0% residual stenosis.   Mid LAD lesion is 75% stenosed.   A drug-eluting stent was successfully placed using a STENT ONYX FRONTIER 2.5X12, postdilated 2.75 mm and optimized with intravascular ultrasound.   Post intervention, there is a 0% residual stenosis.   LV end diastolic pressure is normal.   There is no aortic valve stenosis.    Discharge Diagnoses:  Active Problems:   Hypertensive emergency   Non-ST elevation (NSTEMI) myocardial infarction [facility])   Discharge Instructions:  Activity:  As tolerated   Discharge Instructions     AMB Referral to Cardiac Rehabilitation - Phase II   Complete by: As directed    Diagnosis: NSTEMI   After initial evaluation and assessments completed: Virtual Based Care may be provided alone or in conjunction with Phase 2 Cardiac Rehab based on patient barriers.: Yes   Call MD for:  difficulty breathing, headache or visual disturbances   Complete by: As directed    Call MD for:  severe uncontrolled pain   Complete by: As directed    Diet - low sodium heart healthy   Complete by: As directed    Diet Carb Modified   Complete by: As directed  Discharge instructions   Complete by: As directed    Follow with Primary MD  Sonia Side., FNP in 1-2 weeks  Follow-up with cardiology as instructed  Follow-up with your dialysis center as previous.  Please get a complete blood count and chemistry panel checked by your Primary MD at your next visit, and again as instructed by your Primary MD.  Get Medicines reviewed and adjusted: Please take all your medications with you for your next visit with your Primary MD  Laboratory/radiological data: Please request your Primary MD to go over  all hospital tests and procedure/radiological results at the follow up, please ask your Primary MD to get all Hospital records sent to his/her office.  In some cases, they will be blood work, cultures and biopsy results pending at the time of your discharge. Please request that your primary care M.D. follows up on these results.  Also Note the following: If you experience worsening of your admission symptoms, develop shortness of breath, life threatening emergency, suicidal or homicidal thoughts you must seek medical attention immediately by calling 911 or calling your MD immediately  if symptoms less severe.  You must read complete instructions/literature along with all the possible adverse reactions/side effects for all the Medicines you take and that have been prescribed to you. Take any new Medicines after you have completely understood and accpet all the possible adverse reactions/side effects.   Do not drive when taking Pain medications or sleeping medications (Benzodaizepines)  Do not take more than prescribed Pain, Sleep and Anxiety Medications. It is not advisable to combine anxiety,sleep and pain medications without talking with your primary care practitioner  Special Instructions: If you have smoked or chewed Tobacco  in the last 2 yrs please stop smoking, stop any regular Alcohol  and or any Recreational drug use.  Wear Seat belts while driving.  Please note: You were cared for by a hospitalist during your hospital stay. Once you are discharged, your primary care physician will handle any further medical issues. Please note that NO REFILLS for any discharge medications will be authorized once you are discharged, as it is imperative that you return to your primary care physician (or establish a relationship with a primary care physician if you do not have one) for your post hospital discharge needs so that they can reassess your need for medications and monitor your lab values.    Increase activity slowly   Complete by: As directed    No dressing needed   Complete by: As directed       Allergies as of 07/13/2021   No Known Allergies      Medication List     STOP taking these medications    amLODipine 10 MG tablet Commonly known as: NORVASC       TAKE these medications    acetaminophen 500 MG tablet Commonly known as: TYLENOL Take 1,000 mg by mouth every 6 (six) hours as needed for moderate pain or headache.   aspirin 81 MG chewable tablet Chew 1 tablet (81 mg total) by mouth daily.   atorvastatin 80 MG tablet Commonly known as: LIPITOR Take 1 tablet (80 mg total) by mouth daily.   Auryxia 1 GM 210 MG(Fe) tablet Generic drug: ferric citrate Take 630 mg by mouth See admin instructions. Take 3tablets (630 mg) by mouth each meal and each snack   calcitRIOL 0.5 MCG capsule Commonly known as: ROCALTROL Take 5 capsules (2.5 mcg total) by mouth Every Tuesday,Thursday,and Saturday with dialysis.  carvedilol 6.25 MG tablet Commonly known as: COREG Take 1 tablet (6.25 mg total) by mouth 2 (two) times daily with a meal.   CLEAR EYES COMPLETE OP Place 1 drop into both eyes daily as needed (dry eyes).   clopidogrel 75 MG tablet Commonly known as: PLAVIX Take 1 tablet (75 mg total) by mouth daily with breakfast.   hydrALAZINE 25 MG tablet Commonly known as: APRESOLINE Take 1 tablet (25 mg total) by mouth every 8 (eight) hours.   HYDROcodone-acetaminophen 5-325 MG tablet Commonly known as: NORCO/VICODIN Take 1 tablet by mouth every 4 (four) hours as needed for moderate pain.   insulin detemir 100 UNIT/ML FlexPen Commonly known as: LEVEMIR Inject 18 Units into the skin daily with breakfast. What changed: when to take this   Insulin Pen Needle 32G X 4 MM Misc Use with insulin pen to dispense insulin as directed   isosorbide mononitrate 30 MG 24 hr tablet Commonly known as: IMDUR Take 1 tablet (30 mg total) by mouth daily.   lubiprostone  24 MCG capsule Commonly known as: AMITIZA Take 24 mcg by mouth 2 (two) times daily as needed for constipation.               Discharge Care Instructions  (From admission, onward)           Start     Ordered   07/13/21 0000  No dressing needed        07/13/21 1015            Follow-up Information     Almyra Deforest, [place] Follow up on 07/23/2021.   Specialties: Cardiology, Radiology Why: at 11:15am for your follow up appt with Dr. Debbe Mounts PA [facility] information: [place] [address] [place], [place]., FNP. Schedule an appointment as soon as possible for a visit in 1 week(s).   Specialty: Family Medicine Contact information: [place] [address] [phone]         Geralynn Rile, MD .   Specialties: Cardiology, Internal Medicine, Radiology Contact information: [address] [place] [address] [phone]                No Known Allergies    Consultations:  Cards, PCCM, Renal   Other Procedures/Studies: CARDIAC CATHETERIZATION  Result Date: 07/12/2021   Prox LAD to Mid LAD lesion is 80% stenosed.   A drug-eluting stent was successfully placed using a STENT ONYX FRONTIER 3.0X12, postdilated to 3.7 mm and optimized with intravascular ultrasound.   Post intervention, there is a 0% residual stenosis.   Mid LAD lesion is 75% stenosed.   A drug-eluting stent was successfully placed using a STENT ONYX FRONTIER 2.5X12, postdilated 2.75 mm and optimized with intravascular ultrasound.   Post intervention, there is a 0% residual stenosis.   LV end diastolic pressure is normal.   There is no aortic valve stenosis. Continue aggressive secondary prevention.  Dual antiplatelet therapy with presumably aspirin and Plavix given issues with anemia.  Brilinta may put her at higher risk of bleeding. Medical therapy for LV dysfunction.   DG Chest Port 1 View  Result Date:  07/09/2021 CLINICAL DATA:  Endotracheal tube placement. EXAM: PORTABLE CHEST 1 VIEW COMPARISON:  07/09/2021, earlier same day FINDINGS: 0817 hours. Endotracheal tube tip is 2.3 cm above the base of the carina. The NG tube passes into the stomach although the distal tip position is not  included on the film. Right IJ central line tip overlies the region of the SVC/RA junction. Diffuse parahilar airspace disease bilaterally suggests edema. Cardiopericardial silhouette is at upper limits of normal for size. Telemetry leads overlie the chest. IMPRESSION: Endotracheal tube tip is 2.3 cm above the base of the carina. Stable cardiopulmonary exam. Electronically Signed   By: Misty Stanley M.D.   On: 07/09/2021 08:42   DG Chest Portable 1 View  Result Date: 07/09/2021 CLINICAL DATA:  Shortness of breath. EXAM: PORTABLE CHEST 1 VIEW COMPARISON:  July 23, 2020. FINDINGS: The heart size and mediastinal contours are within normal limits. Interval development of bilateral reticular and interstitial opacities concerning for pulmonary edema or atypical pneumonia. Right internal jugular catheter is noted with distal tip in expected position of right atrium. The visualized skeletal structures are unremarkable. IMPRESSION: Interval development of bilateral lung opacities concerning for edema or atypical pneumonia. Electronically Signed   By: Marijo Conception M.D.   On: 07/09/2021 08:09   DG Abd Portable 1V  Result Date: 07/09/2021 CLINICAL DATA:  Nasogastric tube placement. EXAM: PORTABLE ABDOMEN - 1 VIEW COMPARISON:  Chest x-ray 07/09/2021. FINDINGS: Nasogastric tube tip terminates over the mid stomach. Central venous catheter tip projects over the distal SVC, unchanged. There are no dilated bowel loops in the upper abdomen. There is left basilar atelectasis/airspace disease. IMPRESSION: Nasogastric tube tip projects over the mid stomach. Electronically Signed   By: Ronney Asters M.D.   On: 07/09/2021 19:25    ECHOCARDIOGRAM LIMITED  Result Date: 07/09/2021    ECHOCARDIOGRAM LIMITED REPORT   Patient Name:   Melissa Howe Date of Exam: 07/09/2021 Medical Rec #:  [id number]         Height:       66.0 in Accession #:    [id number]        Weight:       165.3 lb Date of Birth:  06/30/1951          BSA:          1.844 m Patient Age:    59 years          BP:           190/97 mmHg Patient Gender: F                 HR:           93 bpm. Exam Location:  Inpatient Procedure: 3D Echo, Limited Echo, Cardiac Doppler and Color Doppler STAT ECHO Indications:    R06.02 SOB  History:        Patient has prior history of Echocardiogram examinations.                 Signs/Symptoms:Dizziness/Lightheadedness, Syncope and                 Bacteremia; Risk Factors:Hypertension, Diabetes and                 Dyslipidemia. Kidney disorder. Flash pulmonary edema.  Sonographer:    Roseanna Rainbow RDCS Referring Phys: [id number] Candee Furbish  Sonographer Comments: Echo performed with patient supine and on artificial respirator and Technically difficult study due to poor echo windows. IMPRESSIONS  1. Global hypokinesis worse in the anteroseptal and mid-apical anterior myocardium. Left ventricular ejection fraction, by estimation, is 35 to 40%. The left ventricle has moderately decreased function. The left ventricle demonstrates regional wall motion abnormalities (see scoring diagram/findings for description). Left ventricular diastolic parameters are consistent with Grade  I diastolic dysfunction (impaired relaxation).  2. Right ventricular systolic function is normal. There is mildly elevated pulmonary artery systolic pressure.  3. The mitral valve is normal in structure. Mild mitral valve regurgitation. No evidence of mitral stenosis.  4. The aortic valve is tricuspid. Aortic valve regurgitation is not visualized. No aortic stenosis is present.  5. The inferior vena cava is dilated in size with <50% respiratory variability, suggesting right atrial  pressure of 15 mmHg. Comparison(s): Compared with the echo 12/2019, the echo now shows worse systolic function in the anterior and anterosepal myocardium. Previously, the systolic dysfunction was similar globally. FINDINGS  Left Ventricle: Global hypokinesis worse in the anteroseptal and mid-apical anterior myocardium. Left ventricular ejection fraction, by estimation, is 35 to 40%. The left ventricle has moderately decreased function. The left ventricle demonstrates regional wall motion abnormalities. The left ventricular internal cavity size was normal in size. There is no left ventricular hypertrophy. Left ventricular diastolic parameters are consistent with Grade I diastolic dysfunction (impaired relaxation). Right Ventricle: No increase in right ventricular wall thickness. Right ventricular systolic function is normal. There is mildly elevated pulmonary artery systolic pressure. The tricuspid regurgitant velocity is 2.37 m/s, and with an assumed right atrial  pressure of 15 mmHg, the estimated right ventricular systolic pressure is [id number] mmHg. Mitral Valve: The mitral valve is normal in structure. Mild mitral annular calcification. Mild mitral valve regurgitation. No evidence of mitral valve stenosis. Tricuspid Valve: The tricuspid valve is normal in structure. Tricuspid valve regurgitation is trivial. No evidence of tricuspid stenosis. Aortic Valve: The aortic valve is tricuspid. Aortic valve regurgitation is not visualized. No aortic stenosis is present. Pulmonic Valve: The pulmonic valve was normal in structure. Pulmonic valve regurgitation is not visualized. No evidence of pulmonic stenosis. Aorta: The aortic root is normal in size and structure. Venous: The inferior vena cava is dilated in size with less than 50% respiratory variability, suggesting right atrial pressure of 15 mmHg. LEFT VENTRICLE PLAX 2D LVIDd:         3.80 cm LVIDs:         3.20 cm LV PW:         0.92 cm LV IVS:        1.08 cm  LV Volumes  (MOD) LV vol d, MOD A2C: 69.0 ml LV vol d, MOD A4C: 67.9 ml LV vol s, MOD A2C: 42.9 ml LV vol s, MOD A4C: 36.6 ml LV SV MOD A2C:     26.1 ml LV SV MOD A4C:     67.9 ml LV SV MOD BP:      27.9 ml IVC IVC diam: 2.10 cm LEFT ATRIUM         Index LA diam:    3.00 cm 1.63 cm/m   AORTA Ao Root diam: 2.90 cm Ao Asc diam:  3.00 cm TRICUSPID VALVE TR Peak grad:   22.5 mmHg TR Vmax:        237.42 cm/s Skeet Latch MD Electronically signed by Skeet Latch MD Signature Date/Time: 07/09/2021/12:12:27 PM    Final      TODAY-DAY OF DISCHARGE:  Subjective:   Melissa Howe today has no headache,no chest abdominal pain,no new weakness tingling or numbness, feels much better wants to go home today.  Objective:   Blood pressure (!) 110/57, pulse 86, temperature 98.4 F (36.9 C), temperature source Oral, resp. rate 16, height '5\' 6"'$  (1.676 m), weight 76.3 kg, SpO2 100 %.  Intake/Output Summary (Last 24 hours) at 07/13/2021 1017 Last  data filed at 07/13/2021 0700 Gross per 24 hour  Intake 120 ml  Output 500 ml  Net -380 ml   Filed Weights   07/12/21 0459 07/13/21 0459 07/13/21 0745  Weight: 74.9 kg 76.9 kg 76.3 kg    Exam: Awake Alert, Oriented *3, No new F.N deficits, Normal affect [place].AT,PERRAL Supple Neck,No JVD, No cervical lymphadenopathy appriciated.  Symmetrical Chest wall movement, Good air movement bilaterally, CTAB RRR,No Gallops,Rubs or new Murmurs, No Parasternal Heave +ve B.Sounds, Abd Soft, Non tender, No organomegaly appriciated, No rebound -guarding or rigidity. No Cyanosis, Clubbing or edema, No new Rash or bruise   PERTINENT RADIOLOGIC STUDIES: CARDIAC CATHETERIZATION  Result Date: 07/12/2021   Prox LAD to Mid LAD lesion is 80% stenosed.   A drug-eluting stent was successfully placed using a STENT ONYX FRONTIER 3.0X12, postdilated to 3.7 mm and optimized with intravascular ultrasound.   Post intervention, there is a 0% residual stenosis.   Mid LAD lesion is 75% stenosed.   A  drug-eluting stent was successfully placed using a STENT ONYX FRONTIER 2.5X12, postdilated 2.75 mm and optimized with intravascular ultrasound.   Post intervention, there is a 0% residual stenosis.   LV end diastolic pressure is normal.   There is no aortic valve stenosis. Continue aggressive secondary prevention.  Dual antiplatelet therapy with presumably aspirin and Plavix given issues with anemia.  Brilinta may put her at higher risk of bleeding. Medical therapy for LV dysfunction.     PERTINENT LAB RESULTS: CBC: Recent Labs    07/12/21 0405 07/13/21 0539  WBC 7.9 8.5  HGB 9.1* 8.7*  HCT 28.8* 26.6*  PLT 226 248   CMET CMP     Component Value Date/Time   NA 135 07/13/2021 0539   K 3.9 07/13/2021 0539   CL 98 07/13/2021 0539   CO2 25 07/13/2021 0539   GLUCOSE 111 (H) 07/13/2021 0539   BUN 29 (H) 07/13/2021 0539   CREATININE 6.41 (H) 07/13/2021 0539   CALCIUM 9.0 07/13/2021 0539   PROT 7.7 07/09/2021 0750   ALBUMIN 2.7 (L) 07/13/2021 0539   AST 22 07/09/2021 0750   ALT 16 07/09/2021 0750   ALKPHOS 81 07/09/2021 0750   BILITOT 0.9 07/09/2021 0750   GFRNONAA 6 (L) 07/13/2021 0539   GFRAA 8 (L) 07/24/2020 0510    GFR Estimated Creatinine Clearance: 8.4 mL/min (A) (by C-G formula based on SCr of 6.41 mg/dL (H)). No results for input(s): LIPASE, AMYLASE in the last 72 hours. No results for input(s): CKTOTAL, CKMB, CKMBINDEX, TROPONINI in the last 72 hours. Invalid input(s): POCBNP No results for input(s): DDIMER in the last 72 hours. No results for input(s): HGBA1C in the last 72 hours. No results for input(s): CHOL, HDL, LDLCALC, TRIG, CHOLHDL, LDLDIRECT in the last 72 hours. No results for input(s): TSH, T4TOTAL, T3FREE, THYROIDAB in the last 72 hours.  Invalid input(s): FREET3 No results for input(s): VITAMINB12, FOLATE, FERRITIN, TIBC, IRON, RETICCTPCT in the last 72 hours. Coags: No results for input(s): INR in the last 72 hours.  Invalid input(s):  PT Microbiology: Recent Results (from the past 240 hour(s))  Resp Panel by RT-PCR (Flu A&B, Covid) Nasopharyngeal Swab     Status: None   Collection Time: 07/09/21  7:50 AM   Specimen: Nasopharyngeal Swab; Nasopharyngeal(NP) swabs in vial transport medium  Result Value Ref Range Status   SARS Coronavirus 2 by RT PCR NEGATIVE NEGATIVE Final    Comment: (NOTE) SARS-CoV-2 target nucleic acids are NOT DETECTED.  The SARS-CoV-2  RNA is generally detectable in upper respiratory specimens during the acute phase of infection. The lowest concentration of SARS-CoV-2 viral copies this assay can detect is 138 copies/mL. A negative result does not preclude SARS-Cov-2 infection and should not be used as the sole basis for treatment or other patient management decisions. A negative result may occur with  improper specimen collection/handling, submission of specimen other than nasopharyngeal swab, presence of viral mutation(s) within the areas targeted by this assay, and inadequate number of viral copies(<138 copies/mL). A negative result must be combined with clinical observations, patient history, and epidemiological information. The expected result is Negative.  Fact Sheet for Patients:  EntrepreneurPulse.com.au  Fact Sheet for Healthcare Providers:  IncredibleEmployment.be  This test is no t yet approved or cleared by the [place] FDA and  has been authorized for detection and/or diagnosis of SARS-CoV-2 by FDA under an Emergency Use Authorization (EUA). This EUA will remain  in effect (meaning this test can be used) for the duration of the COVID-19 declaration under Section 564(b)(1) of the Act, 21 U.S.C.section 360bbb-3(b)(1), unless the authorization is terminated  or revoked sooner.       Influenza A by PCR NEGATIVE NEGATIVE Final   Influenza B by PCR NEGATIVE NEGATIVE Final    Comment: (NOTE) The Xpert Xpress SARS-CoV-2/FLU/RSV plus assay is  intended as an aid in the diagnosis of influenza from Nasopharyngeal swab specimens and should not be used as a sole basis for treatment. Nasal washings and aspirates are unacceptable for Xpert Xpress SARS-CoV-2/FLU/RSV testing.  Fact Sheet for Patients: EntrepreneurPulse.com.au  Fact Sheet for Healthcare Providers: IncredibleEmployment.be  This test is not yet approved or cleared by the [place] FDA and has been authorized for detection and/or diagnosis of SARS-CoV-2 by FDA under an Emergency Use Authorization (EUA). This EUA will remain in effect (meaning this test can be used) for the duration of the COVID-19 declaration under Section 564(b)(1) of the Act, 21 U.S.C. section 360bbb-3(b)(1), unless the authorization is terminated or revoked.  Performed at [facility], [place] [address] [place], [place] [address]   Blood Culture (routine x 2)     Status: None (Preliminary result)   Collection Time: 07/09/21  8:53 AM   Specimen: BLOOD  Result Value Ref Range Status   Specimen Description BLOOD SITE NOT SPECIFIED  Final   Special Requests   Final    BOTTLES DRAWN AEROBIC AND ANAEROBIC Blood Culture adequate volume   Culture   Final    NO GROWTH 4 DAYS Performed at [facility], [place] [address], [place], [place] [address]    Report Status PENDING  Incomplete  Blood Culture (routine x 2)     Status: None (Preliminary result)   Collection Time: 07/09/21  9:10 AM   Specimen: BLOOD  Result Value Ref Range Status   Specimen Description BLOOD SITE NOT SPECIFIED  Final   Special Requests   Final    BOTTLES DRAWN AEROBIC ONLY Blood Culture results may not be optimal due to an inadequate volume of blood received in culture bottles   Culture   Final    NO GROWTH 4 DAYS Performed at [facility], [place] [address], [place], [place] [address]    Report Status PENDING  Incomplete  MRSA Next Gen by PCR, Nasal     Status: None    Collection Time: 07/09/21  8:55 PM   Specimen: Nasal Mucosa; Nasal Swab  Result Value Ref Range Status   MRSA  by PCR Next Gen NOT DETECTED NOT DETECTED Final    Comment: (NOTE) The GeneXpert MRSA Assay (FDA approved for NASAL specimens only), is one component of a comprehensive MRSA colonization surveillance program. It is not intended to diagnose MRSA infection nor to guide or monitor treatment for MRSA infections. Test performance is not FDA approved in patients less than 22 years old. Performed at [facility], [place] [address], [place], [place] [address]     FURTHER DISCHARGE INSTRUCTIONS:  Get Medicines reviewed and adjusted: Please take all your medications with you for your next visit with your Primary MD  Laboratory/radiological data: Please request your Primary MD to go over all hospital tests and procedure/radiological results at the follow up, please ask your Primary MD to get all Hospital records sent to his/her office.  In some cases, they will be blood work, cultures and biopsy results pending at the time of your discharge. Please request that your primary care M.D. goes through all the records of your hospital data and follows up on these results.  Also Note the following: If you experience worsening of your admission symptoms, develop shortness of breath, life threatening emergency, suicidal or homicidal thoughts you must seek medical attention immediately by calling 911 or calling your MD immediately  if symptoms less severe.  You must read complete instructions/literature along with all the possible adverse reactions/side effects for all the Medicines you take and that have been prescribed to you. Take any new Medicines after you have completely understood and accpet all the possible adverse reactions/side effects.   Do not drive when taking Pain medications or sleeping medications (Benzodaizepines)  Do not take more than prescribed Pain, Sleep and Anxiety  Medications. It is not advisable to combine anxiety,sleep and pain medications without talking with your primary care practitioner  Special Instructions: If you have smoked or chewed Tobacco  in the last 2 yrs please stop smoking, stop any regular Alcohol  and or any Recreational drug use.  Wear Seat belts while driving.  Please note: You were cared for by a hospitalist during your hospital stay. Once you are discharged, your primary care physician will handle any further medical issues. Please note that NO REFILLS for any discharge medications will be authorized once you are discharged, as it is imperative that you return to your primary care physician (or establish a relationship with a primary care physician if you do not have one) for your post hospital discharge needs so that they can reassess your need for medications and monitor your lab values.  Total Time spent coordinating discharge including counseling, education and face to face time equals 35 minutes.  SignedOren Binet 07/13/2021 10:17 AM

## 2021-07-13 NOTE — Progress Notes (Signed)
Dr. Jonnie Finner orders to modify treatment to 3 hours net uf 2.5L.

## 2021-07-14 ENCOUNTER — Telehealth: Payer: Self-pay | Admitting: Nephrology

## 2021-07-14 LAB — CULTURE, BLOOD (ROUTINE X 2)
Culture: NO GROWTH
Culture: NO GROWTH
Special Requests: ADEQUATE

## 2021-07-14 IMAGING — DX DG CHEST 1V PORT
1 series · 1 of 1 positions shown · non-contrast
Comparison: Radiograph 08/05/2019

CLINICAL DATA: Hypothermic, low blood sugar and lethargy

EXAM:
PORTABLE CHEST 1 VIEW

[chest ap]
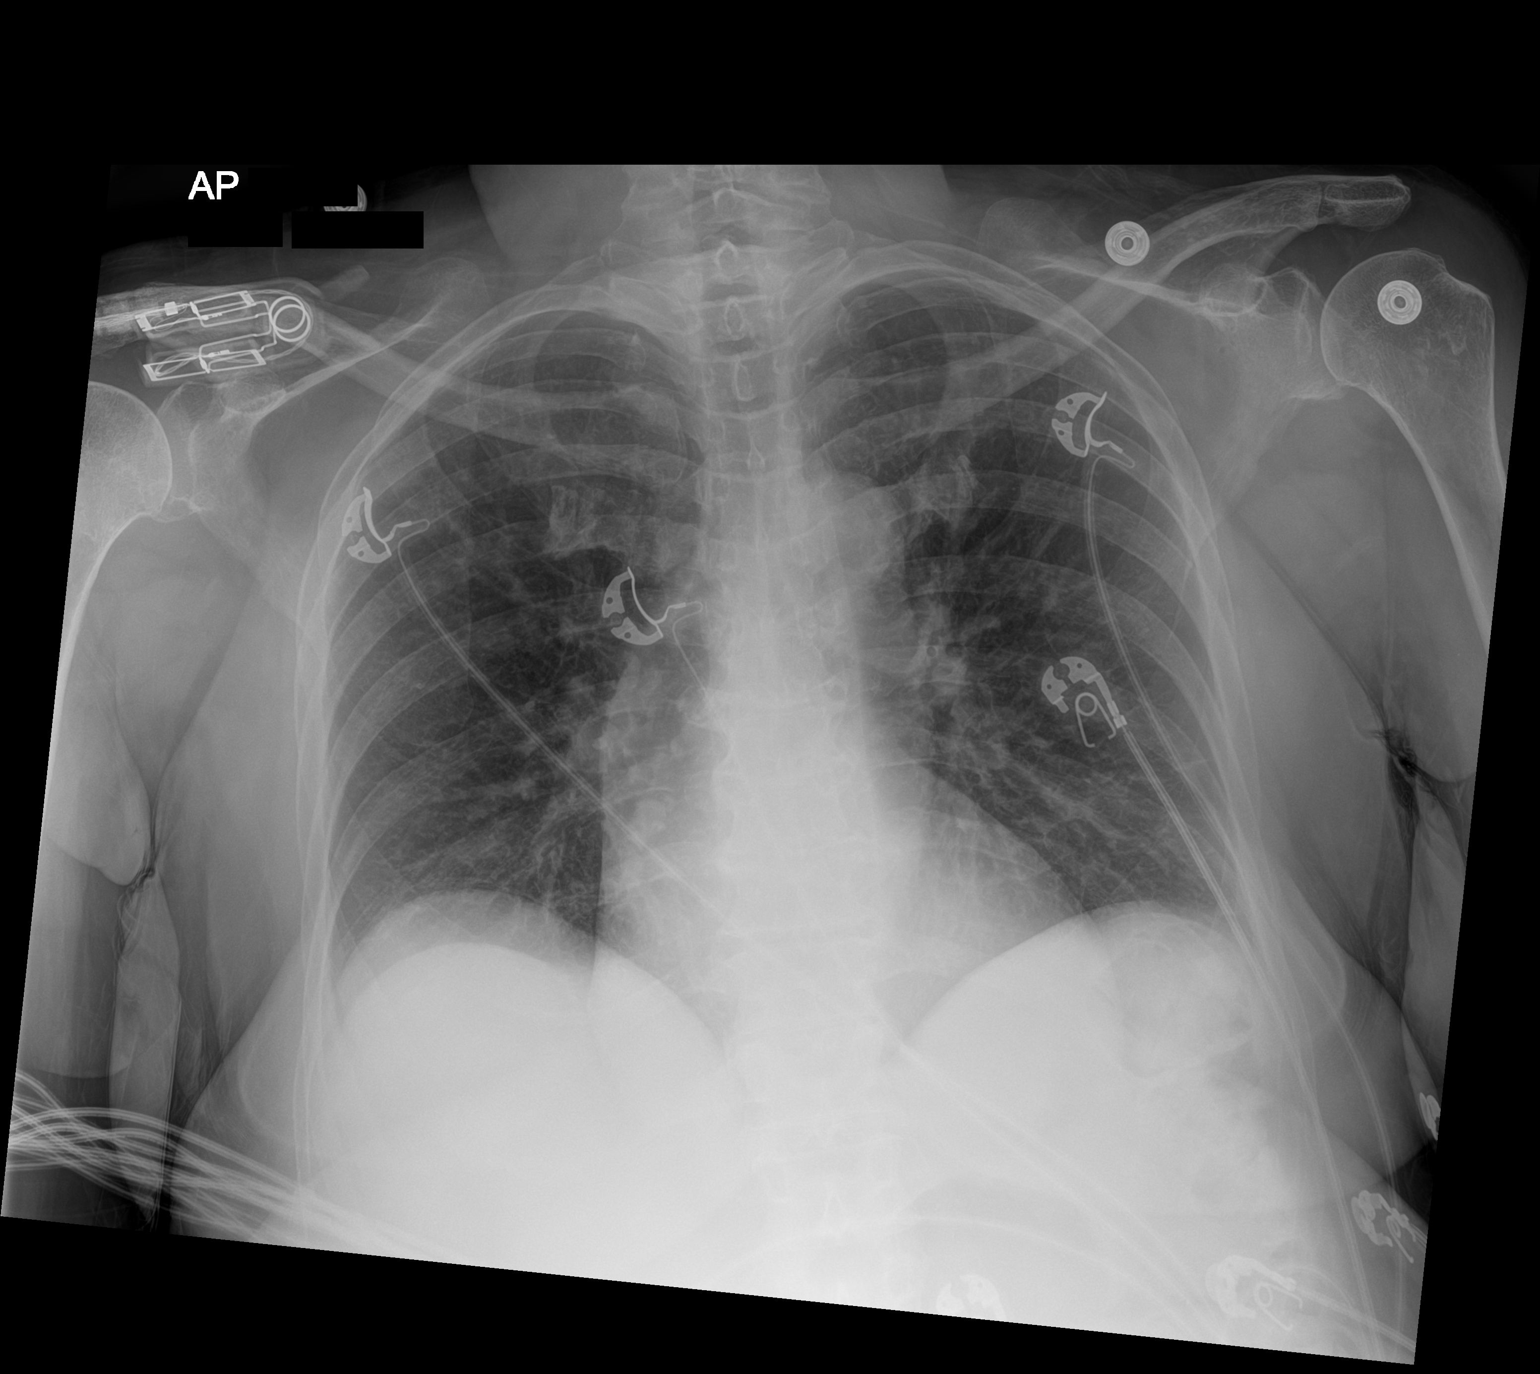

[1 of 1 positions shown; findings below may reference images not displayed]

FINDINGS: There is some mild basilar airways thickening and interstitial
opacity. No pneumothorax or effusion. Cardiomediastinal contours are
stable. No acute osseous or soft tissue abnormality.
IMPRESSION: Mild basilar airways thickening and interstitial opacity, suspicious
for atypical infection/viral infection.

## 2021-07-14 NOTE — Telephone Encounter (Signed)
Transition of care contact from inpatient facility  Date of discharge: 07/13/21 Date of contact: 07/14/21 Method: Phone Spoke to: Patient  Patient contacted to discuss transition of care from recent inpatient hospitalization. Patient was admitted to [facility] from 9/13-9/17/22 with discharge diagnosis of acute hypoxic respiratory failure/flash pulmonary edema/HTN emergency.   Medication changes were reviewed. No questions.   Patient will follow up with his/her outpatient HD unit on: Tuesday 9/20.

## 2021-07-17 ENCOUNTER — Telehealth (HOSPITAL_COMMUNITY): Payer: Self-pay

## 2021-07-17 NOTE — Telephone Encounter (Signed)
Attempted to call patient in regards to Cardiac Rehab - LM on VM 

## 2021-07-17 NOTE — Telephone Encounter (Signed)
Pt insurance is active and benefits verified through Medicare a/b Co-pay 0, DED $233/$233 met, out of pocket 0/0 met, co-insurance 20%. no pre-authorization required. Passport, 07/17/2021@4 :13pm, REF# [id number]  Pt insurance is active through Medicaid. Ref# [id number]  Will fax over [facility] Reimbursement form to Dr. Audie Box  Will contact patient to see if she is interested in the Cardiac Rehab Program. If interested, patient will need to complete follow up appt. Once completed, patient will be contacted for scheduling upon review by the RN Navigator.

## 2021-07-23 ENCOUNTER — Ambulatory Visit: Payer: Medicare Other | Admitting: Physician Assistant

## 2021-08-06 ENCOUNTER — Encounter: Payer: Self-pay | Admitting: Gastroenterology

## 2021-09-15 NOTE — Progress Notes (Signed)
Cardiology Office Note:    Date:  09/16/2021   ID:  Melissa Howe, DOB 03/19/1951, MRN [id number]  PCP:  Sonia Side., [place] Cardiologist: Evalina Field, MD   Reason for visit: Follow-up post PCI  History of Present Illness:    Melissa Howe is a 70 y.o. female with a hx of  ESRD on HD TTS, DM, HFrEF-who presented to the hospital with sudden onset of shortness of breath-she was found to have acute hypoxic respiratory failure requiring intubation in the setting of flash pulm edema, hypertensive emergency and possible non-STEMI.  He proceeded with left heart cath and PCI to LAD.  Today, she states she is doing well from a cardiovascular perspective.  She denies chest pain and shortness of breath.  She walks 2 blocks with her great grandson to go the bus stop and then they take the bus to [place] to walk around.  With this exertion, she has no symptoms.  She states her nephrologist recently changed her blood pressure medications.  He/she stopped carvedilol and restarted her amlodipine 5 mg daily last week.  She states she cannot take her medications before dialysis or her blood pressure would drop.  She denies lower extremity edema, PND and orthopnea.  We discussed cardiac rehab though she thinks this would be too time-consuming given her dialysis schedule.  She states her daughter calls her every day to check on her.  She denies bleeding issues with Plavix and aspirin.    Procedures 9/16>>LHC:   Prox LAD to Mid LAD lesion is 80% stenosed.   A drug-eluting stent was successfully placed using a STENT ONYX FRONTIER 3.0X12, postdilated to 3.7 mm and optimized with intravascular ultrasound.   Post intervention, there is a 0% residual stenosis.   Mid LAD lesion is 75% stenosed.   A drug-eluting stent was successfully placed using a STENT ONYX FRONTIER 2.5X12, postdilated 2.75 mm and optimized with intravascular ultrasound.   Post intervention, there is a 0%  residual stenosis.   LV end diastolic pressure is normal.   There is no aortic valve stenosis.   Past Medical History:  Diagnosis Date   Anemia    Chronic kidney disease    Constipation    Diabetes mellitus    Type II   Dyspnea    with exertion   History of blood transfusion    Hyperlipemia    Hypertension    Pneumonia 2019    Past Surgical History:  Procedure Laterality Date   APPENDECTOMY     AV FISTULA PLACEMENT Left 10/05/2019   Procedure: INSERTION OF ARTERIOVENOUS (AV) GORE-TEX VASCULAR GRAFT LEFT ARM;  Surgeon: [name], MD;  Location: [place];  Service: Vascular;  Laterality: Left;   AV FISTULA PLACEMENT Left 02/27/2021   Procedure: LEFT ARM FIRST STAGE BASILIC VEIN  ARTERIOVENOUS (AV) FISTULA CREATION;  Surgeon: Waynetta Sandy, MD;  Location: [place];  Service: Vascular;  Laterality: Left;   [place] Left 04/17/2021   Procedure: LEFT SECOND STAGE [place];  Surgeon: Waynetta Sandy, MD;  Location: [place];  Service: Vascular;  Laterality: Left;   BIOPSY  07/24/2020   Procedure: BIOPSY;  Surgeon: Doran Stabler, MD;  Location: [place];  Service: Gastroenterology;;   BUBBLE STUDY  01/02/2020   Procedure: BUBBLE STUDY;  Surgeon: Elouise Munroe, MD;  Location: [place];  Service: Cardiology;;   COLONOSCOPY WITH PROPOFOL N/A 07/24/2020   Procedure: COLONOSCOPY WITH PROPOFOL;  Surgeon: Doran Stabler, MD;  Location: [place];  Service: Gastroenterology;  Laterality: N/A;   CORONARY STENT INTERVENTION N/A 07/12/2021   Procedure: CORONARY STENT INTERVENTION;  Surgeon: Jettie Booze, MD;  Location: [place] [facility];  Service: Cardiovascular;  Laterality: N/A;   ESOPHAGOGASTRODUODENOSCOPY (EGD) WITH PROPOFOL N/A 07/24/2020   Procedure: ESOPHAGOGASTRODUODENOSCOPY (EGD) WITH PROPOFOL;  Surgeon: Doran Stabler, MD;  Location: [place];  Service: Gastroenterology;  Laterality: N/A;   EYE SURGERY  Bilateral    Cataract   FOREIGN BODY REMOVAL Left 05/13/2018   Procedure: FOREIGN BODY REMOVAL left foot;  Surgeon: Meredith Pel, MD;  Location: [place];  Service: Orthopedics;  Laterality: Left;   I & D EXTREMITY Left 05/21/2018   Procedure: LEFT FOOT DEBRIDEMENT AND WOUND CLOSURE;  Surgeon: Newt Minion, MD;  Location: [place];  Service: Orthopedics;  Laterality: Left;   INTRAVASCULAR ULTRASOUND/IVUS N/A 07/12/2021   Procedure: Intravascular Ultrasound/IVUS;  Surgeon: Jettie Booze, MD;  Location: [place] [facility];  Service: Cardiovascular;  Laterality: N/A;   LEFT HEART CATH AND CORONARY ANGIOGRAPHY N/A 07/12/2021   Procedure: LEFT HEART CATH AND CORONARY ANGIOGRAPHY;  Surgeon: Jettie Booze, MD;  Location: [place] [facility];  Service: Cardiovascular;  Laterality: N/A;   POLYPECTOMY  07/24/2020   Procedure: POLYPECTOMY;  Surgeon: Doran Stabler, MD;  Location: [place];  Service: Gastroenterology;;   TEE WITHOUT CARDIOVERSION N/A 01/02/2020   Procedure: TRANSESOPHAGEAL ECHOCARDIOGRAM (TEE);  Surgeon: Elouise Munroe, MD;  Location: [place];  Service: Cardiology;  Laterality: N/A;   TONSILLECTOMY     TUBAL LIGATION      Current Medications: Current Meds  Medication Sig   acetaminophen (TYLENOL) 500 MG tablet Take 1,000 mg by mouth every 6 (six) hours as needed for moderate pain or headache.   albuterol (VENTOLIN HFA) 108 (90 Base) MCG/ACT inhaler SMARTSIG:1-2 Puff(s) By Mouth Every 6 Hours PRN   amLODipine (NORVASC) 5 MG tablet Take 5 mg by mouth daily.   aspirin 81 MG chewable tablet Chew 1 tablet (81 mg total) by mouth daily.   atorvastatin (LIPITOR) 80 MG tablet Take 1 tablet (80 mg total) by mouth daily.   AURYXIA 1 GM 210 MG(Fe) tablet Take 630 mg by mouth See admin instructions. Take 3tablets (630 mg) by mouth each meal and each snack   calcitRIOL (ROCALTROL) 0.5 MCG capsule Take 5 capsules (2.5 mcg total) by mouth Every Tuesday,Thursday,and Saturday  with dialysis.   clopidogrel (PLAVIX) 75 MG tablet Take 1 tablet (75 mg total) by mouth daily with breakfast.   hydrALAZINE (APRESOLINE) 25 MG tablet Take 1 tablet (25 mg total) by mouth every 8 (eight) hours.   Hyprom-Naphaz-Polysorb-Zn Sulf (CLEAR EYES COMPLETE OP) Place 1 drop into both eyes daily as needed (dry eyes).   Insulin Detemir (LEVEMIR) 100 UNIT/ML Pen Inject 18 Units into the skin daily with breakfast. (Patient taking differently: Inject 18 Units into the skin at bedtime.)   Insulin Pen Needle 32G X 4 MM MISC Use with insulin pen to dispense insulin as directed   isosorbide mononitrate (IMDUR) 30 MG 24 hr tablet Take 1 tablet (30 mg total) by mouth daily.   lubiprostone (AMITIZA) 24 MCG capsule Take 24 mcg by mouth 2 (two) times daily as needed for constipation.     Allergies:   Patient has no known allergies.   Social History   Socioeconomic History   Marital status: Divorced    Spouse name: Not on  file   Number of children: Not on file   Years of education: Not on file   Highest education level: Not on file  Occupational History   Occupation: Retired  Tobacco Use   Smoking status: Former    Years: 4.00    Types: Cigarettes    Quit date: 01/19/1984    Years since quitting: 37.6   Smokeless tobacco: Never  Vaping Use   Vaping Use: Never used  Substance and Sexual Activity   Alcohol use: No   Drug use: No   Sexual activity: Not on file  Other Topics Concern   Not on file  Social History Narrative   Lives in [place] with her dtr.  Previously worked in a [employer] but has been out on disability since 2009.  Recently moved from disability to "retired" when she turned 54.  Sedentary.  Knits frequently for fun.   Social Determinants of Health   Financial Resource Strain: Not on file  Food Insecurity: Not on file  Transportation Needs: Not on file  Physical Activity: Not on file  Stress: Not on file  Social Connections: Not on file     Family History: The patient's  family history includes Diabetes in her mother; Hypertension in her mother and sister.  ROS:   Please see the history of present illness.     EKGs/Labs/Other Studies Reviewed:    Recent Labs: 07/09/2021: ALT 16; B Natriuretic Peptide 3,314.8 07/10/2021: TSH 2.367 07/13/2021: BUN 29; Creatinine, Ser 6.41; Hemoglobin 8.7; Platelets 248; Potassium 3.9; Sodium 135   Recent Lipid Panel Lab Results  Component Value Date/Time   CHOL 209 (H) 07/10/2021 05:06 AM   TRIG 120 07/10/2021 05:06 AM   TRIG 115 07/10/2021 05:06 AM   HDL 41 07/10/2021 05:06 AM   LDLCALC 145 (H) 07/10/2021 05:06 AM    Physical Exam:    VS:  BP (!) 160/78   Pulse 76   Ht 5\' 4"  (1.626 m)   Wt 165 lb (74.8 kg)   SpO2 99%   BMI 28.32 kg/m    No data found.  Wt Readings from Last 3 Encounters:  09/16/21 165 lb (74.8 kg)  07/13/21 162 lb 11.2 oz (73.8 kg)  05/17/21 167 lb 1.6 oz (75.8 kg)     GEN:  Well nourished, well developed in no acute distress HEENT: Normal NECK: No JVD; No carotid bruits CARDIAC: RRR, no murmurs, rubs, gallops RESPIRATORY:  Clear to auscultation without rales, wheezing or rhonchi  ABDOMEN: Soft, non-tender, non-distended MUSCULOSKELETAL: No edema; No deformity  SKIN: Warm and dry NEUROLOGIC:  Alert and oriented PSYCHIATRIC:  Normal affect     ASSESSMENT AND PLAN   NSTEMI/[name], stable -Status post PCI to the LAD September 2022 -Continue aspirin & Plavix x1 year.  She likely could come off Plavix in September 2023. -Continue high-dose statin.   -Her nephrologist took her off carvedilol and restarted amlodipine.  Ischemic cardiomyopathy, 35-40% -Volume status maintained by dialysis. -Not a candidate for ACE/ARB/ARNI/MRA given ESRD. -Continue current medications.  Hypertension, elevated  -We will defer to nephrology who changed her medications last week.  She has room to titrate hydralazine, Imdur and amlodipine. -Goal BP is <130/80.  Recommend DASH diet (high in vegetables,  fruits, low-fat dairy products, whole grains, poultry, fish, and nuts and low in sweets, sugar-sweetened beverages, and red meats), salt restriction and increase physical activity.  Hyperlipidemia -LDL 145 in September 2022.  Started on Lipitor 80 daily. -Check fasting lipids (printed order to take to  dialysis)  Disposition - Follow-up in 6 months.      Medication Adjustments/Labs and Tests Ordered: Current medicines are reviewed at length with the patient today.  Concerns regarding medicines are outlined above.  Orders Placed This Encounter  Procedures   Lipid panel   No orders of the defined types were placed in this encounter.   Patient Instructions  Medication Instructions:  No Changes *If you need a refill on your cardiac medications before your next appointment, please call your pharmacy*   Lab Work: Lipid Panel If you have labs (blood work) drawn today and your tests are completely normal, you will receive your results only by: [place] (if you have MyChart) OR A paper copy in the mail If you have any lab test that is abnormal or we need to change your treatment, we will call you to review the results.   Testing/Procedures: No Testing   Follow-Up: At [facility], you and your health needs are our priority.  As part of our continuing mission to provide you with exceptional heart care, we have created designated Provider Care Teams.  These Care Teams include your primary Cardiologist (physician) and Advanced Practice Providers (APPs -  Physician Assistants and Nurse Practitioners) who all work together to provide you with the care you need, when you need it.  We recommend signing up for the patient portal called "MyChart".  Sign up information is provided on this After Visit Summary.  MyChart is used to connect with patients for Virtual Visits (Telemedicine).  Patients are able to view lab/test results, encounter notes, upcoming appointments, etc.  Non-urgent  messages can be sent to your provider as well.   To learn more about what you can do with MyChart, go to [email or web address].    Your next appointment:   6 month(s)  The format for your next appointment:   In Person  Provider:   Evalina Field, MD         Signed, Warren Lacy, PA-C  09/16/2021 11:58 AM    [place]

## 2021-09-16 ENCOUNTER — Ambulatory Visit (INDEPENDENT_AMBULATORY_CARE_PROVIDER_SITE_OTHER): Payer: Medicare Other | Admitting: Physician Assistant

## 2021-09-16 ENCOUNTER — Other Ambulatory Visit: Payer: Self-pay

## 2021-09-16 ENCOUNTER — Encounter: Payer: Self-pay | Admitting: Physician Assistant

## 2021-09-16 VITALS — BP 160/78 | HR 76 | Ht 64.0 in | Wt 165.0 lb

## 2021-09-16 DIAGNOSIS — I10 Essential (primary) hypertension: Secondary | ICD-10-CM | POA: Diagnosis not present

## 2021-09-16 DIAGNOSIS — I2510 Atherosclerotic heart disease of native coronary artery without angina pectoris: Secondary | ICD-10-CM | POA: Diagnosis not present

## 2021-09-16 DIAGNOSIS — I214 Non-ST elevation (NSTEMI) myocardial infarction: Secondary | ICD-10-CM

## 2021-09-16 DIAGNOSIS — E785 Hyperlipidemia, unspecified: Secondary | ICD-10-CM | POA: Diagnosis not present

## 2021-09-16 NOTE — Patient Instructions (Signed)
Medication Instructions:  No Changes *If you need a refill on your cardiac medications before your next appointment, please call your pharmacy*   Lab Work: Lipid Panel If you have labs (blood work) drawn today and your tests are completely normal, you will receive your results only by: [place] (if you have MyChart) OR A paper copy in the mail If you have any lab test that is abnormal or we need to change your treatment, we will call you to review the results.   Testing/Procedures: No Testing   Follow-Up: At [facility], you and your health needs are our priority.  As part of our continuing mission to provide you with exceptional heart care, we have created designated Provider Care Teams.  These Care Teams include your primary Cardiologist (physician) and Advanced Practice Providers (APPs -  Physician Assistants and Nurse Practitioners) who all work together to provide you with the care you need, when you need it.  We recommend signing up for the patient portal called "MyChart".  Sign up information is provided on this After Visit Summary.  MyChart is used to connect with patients for Virtual Visits (Telemedicine).  Patients are able to view lab/test results, encounter notes, upcoming appointments, etc.  Non-urgent messages can be sent to your provider as well.   To learn more about what you can do with MyChart, go to [email or web address].    Your next appointment:   6 month(s)  The format for your next appointment:   In Person  Provider:   Evalina Field, MD

## 2022-05-31 ENCOUNTER — Other Ambulatory Visit: Payer: Self-pay

## 2022-05-31 ENCOUNTER — Emergency Department (HOSPITAL_COMMUNITY)
Admission: EM | Admit: 2022-05-31 | Discharge: 2022-05-31 | Disposition: A | Discharge status: Home / Self Care | Payer: Medicare Other | Attending: Emergency Medicine | Admitting: Emergency Medicine

## 2022-05-31 DIAGNOSIS — I120 Hypertensive chronic kidney disease with stage 5 chronic kidney disease or end stage renal disease: Secondary | ICD-10-CM | POA: Diagnosis not present

## 2022-05-31 DIAGNOSIS — E1165 Type 2 diabetes mellitus with hyperglycemia: Secondary | ICD-10-CM | POA: Insufficient documentation

## 2022-05-31 DIAGNOSIS — Z79899 Other long term (current) drug therapy: Secondary | ICD-10-CM | POA: Insufficient documentation

## 2022-05-31 DIAGNOSIS — Z7982 Long term (current) use of aspirin: Secondary | ICD-10-CM | POA: Insufficient documentation

## 2022-05-31 DIAGNOSIS — Z992 Dependence on renal dialysis: Secondary | ICD-10-CM | POA: Diagnosis not present

## 2022-05-31 DIAGNOSIS — N186 End stage renal disease: Secondary | ICD-10-CM | POA: Insufficient documentation

## 2022-05-31 DIAGNOSIS — Z794 Long term (current) use of insulin: Secondary | ICD-10-CM | POA: Diagnosis not present

## 2022-05-31 DIAGNOSIS — R739 Hyperglycemia, unspecified: Secondary | ICD-10-CM

## 2022-05-31 LAB — URINALYSIS, ROUTINE W REFLEX MICROSCOPIC
Bilirubin Urine: NEGATIVE
Glucose, UA: >=500 mg/dL — AB
Hgb urine dipstick: NEGATIVE
Ketones, ur: NEGATIVE mg/dL
Leukocytes,Ua: NEGATIVE
Nitrite: NEGATIVE
Protein, ur: 100 mg/dL — AB
Specific Gravity, Urine: 1.012 (ref 1.005–1.030)
pH: 8 (ref 5.0–8.0)

## 2022-05-31 LAB — BASIC METABOLIC PANEL
Anion gap: 14 (ref 5–15)
BUN: 57 mg/dL — ABNORMAL HIGH (ref 8–23)
CO2: 27 mmol/L (ref 22–32)
Calcium: 9.6 mg/dL (ref 8.9–10.3)
Chloride: 92 mmol/L — ABNORMAL LOW (ref 98–111)
Creatinine, Ser: 6.42 mg/dL — ABNORMAL HIGH (ref 0.44–1.00)
GFR, Estimated: 6 mL/min — ABNORMAL LOW (ref >60)
Glucose, Bld: 275 mg/dL — ABNORMAL HIGH (ref 70–99)
Potassium: 4.3 mmol/L (ref 3.5–5.1)
Sodium: 133 mmol/L — ABNORMAL LOW (ref 135–145)

## 2022-05-31 LAB — CBC
HCT: 32.4 % — ABNORMAL LOW (ref 36.0–46.0)
Hemoglobin: 10.6 g/dL — ABNORMAL LOW (ref 12.0–15.0)
MCH: 29 pg (ref 26.0–34.0)
MCHC: 32.7 g/dL (ref 30.0–36.0)
MCV: 88.8 fL (ref 80.0–100.0)
Platelets: 253 10*3/uL (ref 150–400)
RBC: 3.65 MIL/uL — ABNORMAL LOW (ref 3.87–5.11)
RDW: 13.5 % (ref 11.5–15.5)
WBC: 9.3 10*3/uL (ref 4.0–10.5)
nRBC: 0 % (ref 0.0–0.2)

## 2022-05-31 LAB — CBG MONITORING, ED: Glucose-Capillary: 225 mg/dL — ABNORMAL HIGH (ref 70–99)

## 2022-05-31 NOTE — ED Provider Notes (Signed)
[facility] EMERGENCY DEPARTMENT Provider Note   CSN: [id number] Arrival date & time: 05/31/22  0543     History  Chief Complaint  Patient presents with   Hyperglycemia    Melissa Howe is a 71 y.o. female.   Hyperglycemia     71 year old female with medical history significant for HTN, HLD, DM 2, ESRD on HD who presents to the emergency department with a chief complaint of hyper glycemia at her PCP office.  The patient saw her PCP yesterday and was called this morning as her blood glucose was over than 500 on her office visit labs.  Her PCP urged her to present to the emergency department for further evaluation.  The patient was placed to go to dialysis morning at 0 745 but she missed her dialysis session.  She denies any chest pain or shortness of breath.  She denies any complaints at this time.  Home Medications Prior to Admission medications   Medication Sig Start Date End Date Taking? Authorizing Provider  acetaminophen (TYLENOL) 500 MG tablet Take 1,000 mg by mouth every 6 (six) hours as needed for moderate pain or headache.    [provider]  albuterol (VENTOLIN HFA) 108 (90 Base) MCG/ACT inhaler SMARTSIG:1-2 Puff(s) By Mouth Every 6 Hours PRN 09/02/21   [provider]  amLODipine (NORVASC) 5 MG tablet Take 5 mg by mouth daily.    [provider]  aspirin 81 MG chewable tablet Chew 1 tablet (81 mg total) by mouth daily. 07/13/21   Ghimire, Henreitta Leber, MD  atorvastatin (LIPITOR) 80 MG tablet Take 1 tablet (80 mg total) by mouth daily. 07/13/21   Ghimire, Henreitta Leber, MD  AURYXIA 1 GM 210 MG(Fe) tablet Take 630 mg by mouth See admin instructions. Take 3tablets (630 mg) by mouth each meal and each snack 06/18/20   [provider]  calcitRIOL (ROCALTROL) 0.5 MCG capsule Take 5 capsules (2.5 mcg total) by mouth Every Tuesday,Thursday,and Saturday with dialysis. 07/24/20   Pokhrel, Corrie Mckusick, MD  clopidogrel (PLAVIX) 75 MG tablet Take  1 tablet (75 mg total) by mouth daily with breakfast. 07/13/21   Ghimire, Henreitta Leber, MD  hydrALAZINE (APRESOLINE) 25 MG tablet Take 1 tablet (25 mg total) by mouth every 8 (eight) hours. 07/13/21   Ghimire, Henreitta Leber, MD  Hyprom-Naphaz-Polysorb-Zn Sulf (CLEAR EYES COMPLETE OP) Place 1 drop into both eyes daily as needed (dry eyes).    [provider]  Insulin Detemir (LEVEMIR) 100 UNIT/ML Pen Inject 18 Units into the skin daily with breakfast. Patient taking differently: Inject 18 Units into the skin at bedtime. 08/11/19   Terrilee Croak, MD  Insulin Pen Needle 32G X 4 MM MISC Use with insulin pen to dispense insulin as directed 09/08/16   Tat, Shanon Brow, MD  isosorbide mononitrate (IMDUR) 30 MG 24 hr tablet Take 1 tablet (30 mg total) by mouth daily. 07/13/21   Ghimire, Henreitta Leber, MD  lubiprostone (AMITIZA) 24 MCG capsule Take 24 mcg by mouth 2 (two) times daily as needed for constipation. 01/22/21   [provider]      Allergies    Patient has no known allergies.    Review of Systems   Review of Systems  All other systems reviewed and are negative.   Physical Exam Updated Vital Signs BP (!) 163/80 (BP Location: Right Arm)   Pulse 95   Temp 98 F (36.7 C) (Oral)   Resp 15   Ht 5\' 4"  (1.626 m)  Wt 74.8 kg   SpO2 100%   BMI 28.32 kg/m  Physical Exam Vitals and nursing note reviewed.  Constitutional:      General: She is not in acute distress. HENT:     Head: Normocephalic and atraumatic.  Eyes:     Conjunctiva/sclera: Conjunctivae normal.     Pupils: Pupils are equal, round, and reactive to light.  Cardiovascular:     Rate and Rhythm: Normal rate and regular rhythm.  Pulmonary:     Effort: Pulmonary effort is normal. No respiratory distress.  Abdominal:     General: There is no distension.     Tenderness: There is no guarding.  Musculoskeletal:        General: No deformity or signs of injury.     Cervical back: Neck supple.  Skin:    Findings: No lesion or  rash.  Neurological:     General: No focal deficit present.     Mental Status: She is alert. Mental status is at baseline.     ED Results / Procedures / Treatments   Labs (all labs ordered are listed, but only abnormal results are displayed) Labs Reviewed  BASIC METABOLIC PANEL - Abnormal; Notable for the following components:      Result Value   Sodium 133 (*)    Chloride 92 (*)    Glucose, Bld 275 (*)    BUN 57 (*)    Creatinine, Ser 6.42 (*)    GFR, Estimated 6 (*)    All other components within normal limits  CBC - Abnormal; Notable for the following components:   RBC 3.65 (*)    Hemoglobin 10.6 (*)    HCT 32.4 (*)    All other components within normal limits  URINALYSIS, ROUTINE W REFLEX MICROSCOPIC - Abnormal; Notable for the following components:   Glucose, UA >=500 (*)    Protein, ur 100 (*)    Bacteria, UA RARE (*)    All other components within normal limits  CBG MONITORING, ED - Abnormal; Notable for the following components:   Glucose-Capillary 225 (*)    All other components within normal limits  CBG MONITORING, ED    EKG None  Radiology No results found.  Procedures Procedures    Medications Ordered in ED Medications - No data to display  ED Course/ Medical Decision Making/ A&P                           Medical Decision Making Amount and/or Complexity of Data Reviewed Labs: ordered.   71 year old female with medical history significant for HTN, HLD, DM 2, ESRD on HD who presents to the emergency department with a chief complaint of hyper glycemia at her PCP office.  The patient saw her PCP yesterday and was called this morning as her blood glucose was over than 500 on her office visit labs.  Her PCP urged her to present to the emergency department for further evaluation.  The patient was placed to go to dialysis morning at 0 745 but she missed her dialysis session.  She denies any chest pain or shortness of breath.  She denies any complaints at  this time.  On arrival, the patient was vitally stable.  Urinalysis with rare bacteria, negative for UTI.  BMP revealed hyperglycemia to 275, elevated BUN to 57, creatinine 6.42, normal potassium of 4.3, CBC without a leukocytosis, anemia stable to 10.6.  No evidence for DKA or HHS.  Patient missed  her dialysis session this morning.  No indication for acute emergent dialysis at this time as patient lungs on exam are clear to auscultation bilaterally.  She denies any chest pain or shortness of breath.  Overall, the patient is stable for discharge home with continued outpatient management of her diabetes.  Advised that she follow-up with her PCP and also to call her dialysis center to arrange for make up dialysis as her next session is scheduled for Tuesday.  Stable for discharge.   Final Clinical Impression(s) / ED Diagnoses Final diagnoses:  Hyperglycemia  Dialysis patient [facility])    Rx / [place] Orders ED Discharge Orders     None         Regan Lemming, MD 05/31/22 [id number]

## 2022-05-31 NOTE — Discharge Instructions (Signed)
Please contact your dialysis center to schedule a make-up dialysis appointment.  There is no need for emergent dialysis today.  There is no need for inpatient admission based on your blood sugar and laboratory results today.

## 2022-05-31 NOTE — ED Triage Notes (Signed)
Pt via POV recommended ED visit from PCP d/t concerns for high blood sugar. Hx T2DM. Per note pt is dialysis pt with schedule dialysis today. Denies SOB/CP

## 2022-10-06 ENCOUNTER — Inpatient Hospital Stay (HOSPITAL_COMMUNITY)
Admission: EM | Admit: 2022-10-06 | Discharge: 2022-10-10 | DRG: 638 | Disposition: A | Discharge status: Home / Self Care | Payer: Medicare Other | Attending: Internal Medicine | Admitting: Internal Medicine

## 2022-10-06 DIAGNOSIS — Z992 Dependence on renal dialysis: Secondary | ICD-10-CM

## 2022-10-06 DIAGNOSIS — M609 Myositis, unspecified: Secondary | ICD-10-CM | POA: Diagnosis present

## 2022-10-06 DIAGNOSIS — D631 Anemia in chronic kidney disease: Secondary | ICD-10-CM | POA: Diagnosis present

## 2022-10-06 DIAGNOSIS — E785 Hyperlipidemia, unspecified: Secondary | ICD-10-CM | POA: Diagnosis present

## 2022-10-06 DIAGNOSIS — I5020 Unspecified systolic (congestive) heart failure: Secondary | ICD-10-CM

## 2022-10-06 DIAGNOSIS — Z7982 Long term (current) use of aspirin: Secondary | ICD-10-CM

## 2022-10-06 DIAGNOSIS — E1165 Type 2 diabetes mellitus with hyperglycemia: Secondary | ICD-10-CM | POA: Diagnosis present

## 2022-10-06 DIAGNOSIS — N189 Chronic kidney disease, unspecified: Secondary | ICD-10-CM | POA: Diagnosis present

## 2022-10-06 DIAGNOSIS — Z87891 Personal history of nicotine dependence: Secondary | ICD-10-CM

## 2022-10-06 DIAGNOSIS — M2041 Other hammer toe(s) (acquired), right foot: Secondary | ICD-10-CM | POA: Diagnosis present

## 2022-10-06 DIAGNOSIS — Z8249 Family history of ischemic heart disease and other diseases of the circulatory system: Secondary | ICD-10-CM

## 2022-10-06 DIAGNOSIS — I5022 Chronic systolic (congestive) heart failure: Secondary | ICD-10-CM | POA: Insufficient documentation

## 2022-10-06 DIAGNOSIS — L97512 Non-pressure chronic ulcer of other part of right foot with fat layer exposed: Secondary | ICD-10-CM | POA: Diagnosis present

## 2022-10-06 DIAGNOSIS — Z8701 Personal history of pneumonia (recurrent): Secondary | ICD-10-CM

## 2022-10-06 DIAGNOSIS — E11621 Type 2 diabetes mellitus with foot ulcer: Secondary | ICD-10-CM | POA: Diagnosis not present

## 2022-10-06 DIAGNOSIS — I10 Essential (primary) hypertension: Secondary | ICD-10-CM | POA: Diagnosis present

## 2022-10-06 DIAGNOSIS — I252 Old myocardial infarction: Secondary | ICD-10-CM

## 2022-10-06 DIAGNOSIS — E440 Moderate protein-calorie malnutrition: Secondary | ICD-10-CM | POA: Diagnosis present

## 2022-10-06 DIAGNOSIS — Z79899 Other long term (current) drug therapy: Secondary | ICD-10-CM

## 2022-10-06 DIAGNOSIS — Z6828 Body mass index (BMI) 28.0-28.9, adult: Secondary | ICD-10-CM

## 2022-10-06 DIAGNOSIS — E11628 Type 2 diabetes mellitus with other skin complications: Secondary | ICD-10-CM | POA: Diagnosis present

## 2022-10-06 DIAGNOSIS — Z833 Family history of diabetes mellitus: Secondary | ICD-10-CM

## 2022-10-06 DIAGNOSIS — I132 Hypertensive heart and chronic kidney disease with heart failure and with stage 5 chronic kidney disease, or end stage renal disease: Secondary | ICD-10-CM | POA: Diagnosis present

## 2022-10-06 DIAGNOSIS — E1122 Type 2 diabetes mellitus with diabetic chronic kidney disease: Secondary | ICD-10-CM | POA: Diagnosis present

## 2022-10-06 DIAGNOSIS — L089 Local infection of the skin and subcutaneous tissue, unspecified: Secondary | ICD-10-CM | POA: Diagnosis present

## 2022-10-06 DIAGNOSIS — L0889 Other specified local infections of the skin and subcutaneous tissue: Secondary | ICD-10-CM | POA: Diagnosis present

## 2022-10-06 DIAGNOSIS — E1140 Type 2 diabetes mellitus with diabetic neuropathy, unspecified: Secondary | ICD-10-CM | POA: Diagnosis present

## 2022-10-06 DIAGNOSIS — I25810 Atherosclerosis of coronary artery bypass graft(s) without angina pectoris: Secondary | ICD-10-CM | POA: Insufficient documentation

## 2022-10-06 DIAGNOSIS — Z794 Long term (current) use of insulin: Secondary | ICD-10-CM

## 2022-10-06 DIAGNOSIS — E1151 Type 2 diabetes mellitus with diabetic peripheral angiopathy without gangrene: Secondary | ICD-10-CM | POA: Diagnosis present

## 2022-10-06 DIAGNOSIS — M7731 Calcaneal spur, right foot: Secondary | ICD-10-CM | POA: Diagnosis present

## 2022-10-06 DIAGNOSIS — N186 End stage renal disease: Secondary | ICD-10-CM

## 2022-10-06 DIAGNOSIS — L97411 Non-pressure chronic ulcer of right heel and midfoot limited to breakdown of skin: Secondary | ICD-10-CM | POA: Diagnosis present

## 2022-10-06 DIAGNOSIS — M898X9 Other specified disorders of bone, unspecified site: Secondary | ICD-10-CM | POA: Diagnosis present

## 2022-10-06 NOTE — ED Notes (Signed)
Called for triage. No response. 

## 2022-10-07 ENCOUNTER — Encounter (HOSPITAL_COMMUNITY): Payer: Self-pay | Admitting: Emergency Medicine

## 2022-10-07 ENCOUNTER — Observation Stay (HOSPITAL_COMMUNITY): Payer: Medicare Other

## 2022-10-07 ENCOUNTER — Emergency Department (HOSPITAL_COMMUNITY): Payer: Medicare Other

## 2022-10-07 ENCOUNTER — Other Ambulatory Visit: Payer: Self-pay

## 2022-10-07 DIAGNOSIS — E11628 Type 2 diabetes mellitus with other skin complications: Secondary | ICD-10-CM

## 2022-10-07 DIAGNOSIS — L089 Local infection of the skin and subcutaneous tissue, unspecified: Secondary | ICD-10-CM | POA: Diagnosis not present

## 2022-10-07 DIAGNOSIS — I5022 Chronic systolic (congestive) heart failure: Secondary | ICD-10-CM | POA: Insufficient documentation

## 2022-10-07 DIAGNOSIS — I25810 Atherosclerosis of coronary artery bypass graft(s) without angina pectoris: Secondary | ICD-10-CM | POA: Insufficient documentation

## 2022-10-07 LAB — CBC WITH DIFFERENTIAL/PLATELET
Abs Immature Granulocytes: 0.03 10*3/uL (ref 0.00–0.07)
Basophils Absolute: 0 10*3/uL (ref 0.0–0.1)
Basophils Relative: 0 %
Eosinophils Absolute: 0.4 10*3/uL (ref 0.0–0.5)
Eosinophils Relative: 4 %
HCT: 33.6 % — ABNORMAL LOW (ref 36.0–46.0)
Hemoglobin: 10.8 g/dL — ABNORMAL LOW (ref 12.0–15.0)
Immature Granulocytes: 0 %
Lymphocytes Relative: 33 %
Lymphs Abs: 3.3 10*3/uL (ref 0.7–4.0)
MCH: 29.3 pg (ref 26.0–34.0)
MCHC: 32.1 g/dL (ref 30.0–36.0)
MCV: 91.1 fL (ref 80.0–100.0)
Monocytes Absolute: 0.8 10*3/uL (ref 0.1–1.0)
Monocytes Relative: 8 %
Neutro Abs: 5.6 10*3/uL (ref 1.7–7.7)
Neutrophils Relative %: 55 %
Platelets: 277 10*3/uL (ref 150–400)
RBC: 3.69 MIL/uL — ABNORMAL LOW (ref 3.87–5.11)
RDW: 14.1 % (ref 11.5–15.5)
WBC: 10.2 10*3/uL (ref 4.0–10.5)
nRBC: 0 % (ref 0.0–0.2)

## 2022-10-07 LAB — COMPREHENSIVE METABOLIC PANEL
ALT: 18 U/L (ref 0–44)
AST: 20 U/L (ref 15–41)
Albumin: 3.7 g/dL (ref 3.5–5.0)
Alkaline Phosphatase: 110 U/L (ref 38–126)
Anion gap: 18 — ABNORMAL HIGH (ref 5–15)
BUN: 83 mg/dL — ABNORMAL HIGH (ref 8–23)
CO2: 21 mmol/L — ABNORMAL LOW (ref 22–32)
Calcium: 9.6 mg/dL (ref 8.9–10.3)
Chloride: 96 mmol/L — ABNORMAL LOW (ref 98–111)
Creatinine, Ser: 8.74 mg/dL — ABNORMAL HIGH (ref 0.44–1.00)
GFR, Estimated: 4 mL/min — ABNORMAL LOW (ref >60)
Glucose, Bld: 331 mg/dL — ABNORMAL HIGH (ref 70–99)
Potassium: 4.7 mmol/L (ref 3.5–5.1)
Sodium: 135 mmol/L (ref 135–145)
Total Bilirubin: 0.5 mg/dL (ref 0.3–1.2)
Total Protein: 7 g/dL (ref 6.5–8.1)

## 2022-10-07 LAB — CBG MONITORING, ED: Glucose-Capillary: 286 mg/dL — ABNORMAL HIGH (ref 70–99)

## 2022-10-07 LAB — PHOSPHORUS: Phosphorus: 5.2 mg/dL — ABNORMAL HIGH (ref 2.5–4.6)

## 2022-10-07 LAB — GLUCOSE, CAPILLARY
Glucose-Capillary: 190 mg/dL — ABNORMAL HIGH (ref 70–99)
Glucose-Capillary: 275 mg/dL — ABNORMAL HIGH (ref 70–99)

## 2022-10-07 LAB — LACTIC ACID, PLASMA: Lactic Acid, Venous: 1.2 mmol/L (ref 0.5–1.9)

## 2022-10-07 LAB — HEPATITIS B SURFACE ANTIGEN: Hepatitis B Surface Ag: NONREACTIVE

## 2022-10-07 MED ORDER — ISOSORBIDE MONONITRATE ER 30 MG PO TB24
30.0000 mg | ORAL_TABLET | Freq: Every day | ORAL | Status: DC
  Administered 2022-10-08 – 2022-10-10 (×3): 30 mg via ORAL
  Filled 2022-10-07 (×3): qty 1

## 2022-10-07 MED ORDER — INSULIN DETEMIR 100 UNIT/ML ~~LOC~~ SOLN
15.0000 [IU] | Freq: Every day | SUBCUTANEOUS | Status: DC
  Administered 2022-10-07 – 2022-10-08 (×2): 15 [IU] via SUBCUTANEOUS
  Filled 2022-10-07 (×3): qty 0.15

## 2022-10-07 MED ORDER — VANCOMYCIN HCL 750 MG/150ML IV SOLN
750.0000 mg | Freq: Two times a day (BID) | INTRAVENOUS | Status: DC
  Filled 2022-10-07: qty 150

## 2022-10-07 MED ORDER — METRONIDAZOLE 500 MG PO TABS: 500.0000 mg | ORAL_TABLET | Freq: Two times a day (BID) | ORAL | Status: DC

## 2022-10-07 MED ORDER — POLYETHYLENE GLYCOL 3350 17 G PO PACK: 17.0000 g | PACK | Freq: Every day | ORAL | Status: DC | PRN

## 2022-10-07 MED ORDER — ATORVASTATIN CALCIUM 80 MG PO TABS
80.0000 mg | ORAL_TABLET | Freq: Every day | ORAL | Status: DC
  Administered 2022-10-07 – 2022-10-08 (×2): 80 mg via ORAL
  Filled 2022-10-07 (×2): qty 1

## 2022-10-07 MED ORDER — SODIUM CHLORIDE 0.9 % IV SOLN: 2.0000 g | INTRAVENOUS | Status: DC

## 2022-10-07 MED ORDER — METRONIDAZOLE 500 MG PO TABS
500.0000 mg | ORAL_TABLET | Freq: Two times a day (BID) | ORAL | Status: DC
  Administered 2022-10-07 – 2022-10-09 (×5): 500 mg via ORAL
  Filled 2022-10-07 (×5): qty 1

## 2022-10-07 MED ORDER — AMLODIPINE BESYLATE 5 MG PO TABS
5.0000 mg | ORAL_TABLET | Freq: Every day | ORAL | Status: DC
  Administered 2022-10-07 – 2022-10-10 (×4): 5 mg via ORAL
  Filled 2022-10-07 (×4): qty 1

## 2022-10-07 MED ORDER — HYDRALAZINE HCL 25 MG PO TABS
25.0000 mg | ORAL_TABLET | Freq: Three times a day (TID) | ORAL | Status: DC
  Administered 2022-10-07 – 2022-10-10 (×7): 25 mg via ORAL
  Filled 2022-10-07 (×8): qty 1

## 2022-10-07 MED ORDER — CHLORHEXIDINE GLUCONATE CLOTH 2 % EX PADS
6.0000 | MEDICATED_PAD | Freq: Every day | CUTANEOUS | Status: DC
  Administered 2022-10-08 – 2022-10-09 (×2): 6 via TOPICAL

## 2022-10-07 MED ORDER — VANCOMYCIN HCL 750 MG/150ML IV SOLN
750.0000 mg | Freq: Once | INTRAVENOUS | Status: AC
  Administered 2022-10-07: 750 mg via INTRAVENOUS
  Filled 2022-10-07: qty 150

## 2022-10-07 MED ORDER — SODIUM CHLORIDE 0.9 % IV SOLN
2.0000 g | Freq: Every day | INTRAVENOUS | Status: DC
  Administered 2022-10-07 – 2022-10-09 (×3): 2 g via INTRAVENOUS
  Filled 2022-10-07 (×3): qty 20

## 2022-10-07 MED ORDER — VANCOMYCIN HCL 1500 MG/300ML IV SOLN
1500.0000 mg | Freq: Once | INTRAVENOUS | Status: DC
  Filled 2022-10-07: qty 300

## 2022-10-07 MED ORDER — CALCITRIOL 0.5 MCG PO CAPS: 1.5000 ug | ORAL_CAPSULE | ORAL | Status: DC

## 2022-10-07 MED ORDER — HEPARIN SODIUM (PORCINE) 1000 UNIT/ML DIALYSIS
2000.0000 [IU] | Freq: Once | INTRAMUSCULAR | Status: AC
  Administered 2022-10-07: 2000 [IU] via INTRAVENOUS_CENTRAL
  Filled 2022-10-07: qty 2

## 2022-10-07 MED ORDER — INSULIN ASPART 100 UNIT/ML IJ SOLN
0.0000 [IU] | INTRAMUSCULAR | Status: DC
  Administered 2022-10-07: 3 [IU] via SUBCUTANEOUS
  Administered 2022-10-07: 1 [IU] via SUBCUTANEOUS
  Administered 2022-10-08 (×3): 3 [IU] via SUBCUTANEOUS
  Administered 2022-10-08: 2 [IU] via SUBCUTANEOUS
  Administered 2022-10-09: 1 [IU] via SUBCUTANEOUS
  Administered 2022-10-09 – 2022-10-10 (×3): 2 [IU] via SUBCUTANEOUS
  Administered 2022-10-10: 3 [IU] via SUBCUTANEOUS

## 2022-10-07 MED ORDER — VANCOMYCIN HCL 750 MG/150ML IV SOLN
750.0000 mg | INTRAVENOUS | Status: DC
  Administered 2022-10-07 – 2022-10-10 (×3): 750 mg via INTRAVENOUS
  Filled 2022-10-07 (×4): qty 150

## 2022-10-07 NOTE — ED Notes (Signed)
Pt to imaging

## 2022-10-07 NOTE — Consult Note (Signed)
Renal Service Consult Note [facility]  Melissa Howe 10/07/2022 Sol Blazing, MD Requesting Physician: Dr. Cain Sieve  Reason for Consult: ESRD pt w/ diabetic foot wound HPI: The patient is a 71 y.o. year-old w/ PMH as below who presents with drainage and foul odor from the wounds in her R foot. Not painful due to neuropathy. In ED bp's high, afeb, normal wbc, LA wnl. R foot xray w/o signs of osteo. MRI foot pending. IV abx started w/ vanc/ rocephin and azithromycin. Pt admitted. We are asked to see for dialysis.    Pt seen in her room. No c/o's at this time. No SOB, cough orthopnea. No recent access issues.   ROS - denies CP, no joint pain, no HA, no blurry vision, no rash, no diarrhea, no nausea/ vomiting, no dysuria, no difficulty voiding   Past Medical History  Past Medical History:  Diagnosis Date   Anemia    Chronic kidney disease    Constipation    Diabetes mellitus    Type II   Dyspnea    with exertion   History of blood transfusion    Hyperlipemia    Hypertension    Pneumonia 2019   Past Surgical History  Past Surgical History:  Procedure Laterality Date   APPENDECTOMY     AV FISTULA PLACEMENT Left 10/05/2019   Procedure: INSERTION OF ARTERIOVENOUS (AV) GORE-TEX VASCULAR GRAFT LEFT ARM;  Surgeon: [name], MD;  Location: [place];  Service: Vascular;  Laterality: Left;   AV FISTULA PLACEMENT Left 02/27/2021   Procedure: LEFT ARM FIRST STAGE BASILIC VEIN  ARTERIOVENOUS (AV) FISTULA CREATION;  Surgeon: Waynetta Sandy, MD;  Location: [place];  Service: Vascular;  Laterality: Left;   BASCILIC VEIN TRANSPOSITION Left 04/17/2021   Procedure: LEFT SECOND STAGE [place];  Surgeon: Waynetta Sandy, MD;  Location: [place];  Service: Vascular;  Laterality: Left;   BIOPSY  07/24/2020   Procedure: BIOPSY;  Surgeon: Doran Stabler, MD;  Location: [place];  Service: Gastroenterology;;   BUBBLE STUDY  01/02/2020    Procedure: BUBBLE STUDY;  Surgeon: Elouise Munroe, MD;  Location: [facility] ENDOSCOPY;  Service: Cardiology;;   COLONOSCOPY WITH PROPOFOL N/A 07/24/2020   Procedure: COLONOSCOPY WITH PROPOFOL;  Surgeon: Doran Stabler, MD;  Location: [place];  Service: Gastroenterology;  Laterality: N/A;   CORONARY STENT INTERVENTION N/A 07/12/2021   Procedure: CORONARY STENT INTERVENTION;  Surgeon: Jettie Booze, MD;  Location: [place] CV LAB;  Service: Cardiovascular;  Laterality: N/A;   ESOPHAGOGASTRODUODENOSCOPY (EGD) WITH PROPOFOL N/A 07/24/2020   Procedure: ESOPHAGOGASTRODUODENOSCOPY (EGD) WITH PROPOFOL;  Surgeon: Doran Stabler, MD;  Location: [place];  Service: Gastroenterology;  Laterality: N/A;   EYE SURGERY Bilateral    Cataract   FOREIGN BODY REMOVAL Left 05/13/2018   Procedure: FOREIGN BODY REMOVAL left foot;  Surgeon: Meredith Pel, MD;  Location: [place];  Service: Orthopedics;  Laterality: Left;   I & D EXTREMITY Left 05/21/2018   Procedure: LEFT FOOT DEBRIDEMENT AND WOUND CLOSURE;  Surgeon: Newt Minion, MD;  Location: [place];  Service: Orthopedics;  Laterality: Left;   INTRAVASCULAR ULTRASOUND/IVUS N/A 07/12/2021   Procedure: Intravascular Ultrasound/IVUS;  Surgeon: Jettie Booze, MD;  Location: [place] CV LAB;  Service: Cardiovascular;  Laterality: N/A;   LEFT HEART CATH AND CORONARY ANGIOGRAPHY N/A 07/12/2021   Procedure: LEFT HEART CATH AND CORONARY ANGIOGRAPHY;  Surgeon: Jettie Booze, MD;  Location: [place]  CV LAB;  Service: Cardiovascular;  Laterality: N/A;   POLYPECTOMY  07/24/2020   Procedure: POLYPECTOMY;  Surgeon: Doran Stabler, MD;  Location: [place];  Service: Gastroenterology;;   TEE WITHOUT CARDIOVERSION N/A 01/02/2020   Procedure: TRANSESOPHAGEAL ECHOCARDIOGRAM (TEE);  Surgeon: Elouise Munroe, MD;  Location: [place];  Service: Cardiology;  Laterality: N/A;   TONSILLECTOMY     TUBAL LIGATION     Family History   Family History  Problem Relation Age of Onset   Hypertension Mother    Diabetes Mother    Hypertension Sister    Social History  reports that she quit smoking about 38 years ago. Her smoking use included cigarettes. She has never used smokeless tobacco. She reports that she does not drink alcohol and does not use drugs. Allergies No Known Allergies Home medications Prior to Admission medications   Medication Sig Start Date End Date Taking? Authorizing Provider  amLODipine (NORVASC) 5 MG tablet Take 5 mg by mouth daily.   Yes [provider]  aspirin 81 MG chewable tablet Chew 1 tablet (81 mg total) by mouth daily. 07/13/21  Yes Ghimire, Henreitta Leber, MD  atorvastatin (LIPITOR) 80 MG tablet Take 1 tablet (80 mg total) by mouth daily. Patient taking differently: Take 80 mg by mouth at bedtime. 07/13/21  Yes Ghimire, Henreitta Leber, MD  AURYXIA 1 GM 210 MG(Fe) tablet Take 630 mg by mouth See admin instructions. Take 3tablets (630 mg) by mouth each meal and each snack 06/18/20  Yes [provider]  hydrALAZINE (APRESOLINE) 25 MG tablet Take 1 tablet (25 mg total) by mouth every 8 (eight) hours. 07/13/21  Yes Ghimire, Henreitta Leber, MD  Hyprom-Naphaz-Polysorb-Zn Sulf (CLEAR EYES COMPLETE OP) Place 1 drop into both eyes daily as needed (dry eyes).   Yes [provider]  Insulin Detemir (LEVEMIR) 100 UNIT/ML Pen Inject 18 Units into the skin daily with breakfast. Patient taking differently: Inject 18 Units into the skin at bedtime. 08/11/19  Yes Dahal, Marlowe Aschoff, MD  isosorbide mononitrate (IMDUR) 30 MG 24 hr tablet Take 1 tablet (30 mg total) by mouth daily. 07/13/21  Yes Ghimire, Henreitta Leber, MD  metoCLOPramide (REGLAN) 5 MG tablet Take 5 mg by mouth 3 (three) times daily before meals. 09/19/22  Yes [provider]  multivitamin (RENA-VIT) TABS tablet Take 1 tablet by mouth daily. 07/10/22  Yes [provider]  acetaminophen (TYLENOL) 500 MG tablet Take 1,000 mg by mouth  every 6 (six) hours as needed for moderate pain or headache. Patient not taking: Reported on 10/07/2022    [provider]  albuterol (VENTOLIN HFA) 108 (90 Base) MCG/ACT inhaler SMARTSIG:1-2 Puff(s) By Mouth Every 6 Hours PRN Patient not taking: Reported on 10/07/2022 09/02/21   [provider]  calcitRIOL (ROCALTROL) 0.5 MCG capsule Take 5 capsules (2.5 mcg total) by mouth Every Tuesday,Thursday,and Saturday with dialysis. Patient not taking: Reported on 10/07/2022 07/24/20   Flora Lipps, MD  clopidogrel (PLAVIX) 75 MG tablet Take 1 tablet (75 mg total) by mouth daily with breakfast. Patient not taking: Reported on 10/07/2022 07/13/21   Jonetta Osgood, MD  Insulin Pen Needle 32G X 4 MM MISC Use with insulin pen to dispense insulin as directed 09/08/16   Tat, Shanon Brow, MD  lubiprostone (AMITIZA) 24 MCG capsule Take 24 mcg by mouth 2 (two) times daily as needed for constipation. Patient not taking: Reported on 10/07/2022 01/22/21   [provider]     Vitals:   10/07/22 1250 10/07/22  1258 10/07/22 1301 10/07/22 1419  BP: (!) 185/77  (!) 195/77 (!) 177/71  Pulse: 82  84 83  Resp: 16  16   Temp: 97.8 F (36.6 C)  98 F (36.7 C)   TempSrc: Oral  Oral   SpO2: 100%  100%   Weight:  77.6 kg    Height:  5\' 4"  (1.626 m)     Exam Gen alert, no distress No rash, cyanosis or gangrene Sclera anicteric, throat clear  No jvd or bruits Chest clear bilat to bases, no rales/ wheezing RRR no MRG Abd soft ntnd no mass or ascites +bs GU defer MS no joint effusions or deformity Ext no LE or UE edema, no wounds or ulcers Neuro is alert, Ox 3 , nf    LUA AVFB+bruit    Home meds include - norvasc 5, lipitor, hydralazine 25 tid, imdur 30 qd, prns   OP HD: TTS [facility] 4h  400/1.5  72.5kg  2/2 bath Heparin 2000   LUA AVF - last HD 12/9, post wt 72.7kg   - calcitriol 1.5 ug po tiw - mircera 30 ug q2, last 11/30, due 12/14   Assessment/ Plan: Diabetic foot infection - R  foot w/ drainage/ odor. IV abx started, MRI pending. Plain films w/o osteo. Per pmd.  ESRD - on HD TTS. Has not missed any HD. Plan HD today / tonight.  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 Reassess 12/14.  MBD ckd - CCa in range, phos pending. Cont po vdra. Add on phos.  HTN/ vol - Bp's up a bit, resume home BP meds. Get vol down w/ iHD    Kelly Splinter  MD 10/07/2022, 3:05 PM Recent Labs  Lab 10/07/22 0343  HGB 10.8*  ALBUMIN 3.7  CALCIUM 9.6  PHOS 5.2*  CREATININE 8.74*  K 4.7   Inpatient medications:  amLODipine  5 mg Oral Daily   atorvastatin  80 mg Oral QHS   calcitRIOL  1.5 mcg Oral Q T,Th,Sa-HD   [START ON 10/08/2022] Chlorhexidine Gluconate Cloth  6 each Topical Q0600   hydrALAZINE  25 mg Oral Q8H   insulin aspart  0-6 Units Subcutaneous Q4H   insulin detemir  15 Units Subcutaneous QHS   [START ON 10/08/2022] isosorbide mononitrate  30 mg Oral Daily   metroNIDAZOLE  500 mg Oral BID    cefTRIAXone (ROCEPHIN)  IV Stopped (10/07/22 0858)   vancomycin     vancomycin     polyethylene glycol

## 2022-10-07 NOTE — H&P (Cosign Needed Addendum)
Date: 10/07/2022               Patient Name:  Melissa Howe MRN: [id number]  DOB: [birth year] Age / Sex: 71 y.o., female   PCP: Sonia Side., FNP         Medical Service: Internal Medicine Teaching Service         Attending Physician: Dr. Lottie Mussel, MD    First Contact: Dr. Posey Pronto Pager: [phone]  Second Contact: Dr. Humphrey Rolls Pager: [phone]       After Hours (After 5p/  First Contact Pager: [phone]  weekends / holidays): Second Contact Pager: [phone]   Chief Concern: Foot wound  History of Present Illness:   Melissa Howe is a 71 yo female living with type 2 diabetes, ESRD on HD, [name], HFrEF, hypertension, hyperlipidemia who presents to the emergency room for right foot wound.  Patient noticed discoloration and wound on her right foot yesterday.  She does not have sensation on her foot so she was not aware of the wound before.  She denies any open skin or trauma to her right foot before.  She states that her foot was draining purulent and bleeding yesterday.  She denies any fever, chills, leg pain, or leg swelling.  Denies shortness of breath, chest pain abdominal pain.  Denies prior foot infection in the past.  She has a scheduled appointment with podiatry this Friday but could not wait until then.  Patient report adherence to medications.  She is injecting 30 units of Levemir at bedtime daily.  Her dialysis schedule is Tuesday, Thursday and Saturday.  Last section was last Saturday.  Did not miss any section or short sections.  In the ED, patient was hypertensive but afebrile.  No leukocytosis.  No severe electrolyte derangement.  Lactic acid within normal limits.  Right foot x-ray did not show any evidence of osteomyelitis.  ED provider Dr. Merdis Delay who recommended MRI of the right foot she was started on vancomycin, ceftriaxone and azithromycin.  Meds:  Current Meds  Medication Sig   amLODipine (NORVASC) 5 MG tablet Take 5 mg by mouth daily.   aspirin 81 MG chewable  tablet Chew 1 tablet (81 mg total) by mouth daily.   atorvastatin (LIPITOR) 80 MG tablet Take 1 tablet (80 mg total) by mouth daily. (Patient taking differently: Take 80 mg by mouth at bedtime.)   AURYXIA 1 GM 210 MG(Fe) tablet Take 630 mg by mouth See admin instructions. Take 3tablets (630 mg) by mouth each meal and each snack   hydrALAZINE (APRESOLINE) 25 MG tablet Take 1 tablet (25 mg total) by mouth every 8 (eight) hours.   Hyprom-Naphaz-Polysorb-Zn Sulf (CLEAR EYES COMPLETE OP) Place 1 drop into both eyes daily as needed (dry eyes).   Insulin Detemir (LEVEMIR) 100 UNIT/ML Pen Inject 18 Units into the skin daily with breakfast. (Patient taking differently: Inject 18 Units into the skin at bedtime.)   isosorbide mononitrate (IMDUR) 30 MG 24 hr tablet Take 1 tablet (30 mg total) by mouth daily.   metoCLOPramide (REGLAN) 5 MG tablet Take 5 mg by mouth 3 (three) times daily before meals.   multivitamin (RENA-VIT) TABS tablet Take 1 tablet by mouth daily.     Allergies: Allergies as of 10/06/2022   (No Known Allergies)   Past Medical History:  Diagnosis Date   Anemia    Chronic kidney disease    Constipation    Diabetes mellitus    Type II   Dyspnea  with exertion   History of blood transfusion    Hyperlipemia    Hypertension    Pneumonia 2019    Family History:  Family History  Problem Relation Age of Onset   Hypertension Mother    Diabetes Mother    Hypertension Sister      Social History:  -Lives with her daughters -Independent of ADLs -Denies smoking, alcohol or drug use -PCP: Dr. Tamala Julian at [facility]: A complete ROS was negative except as per HPI.   Physical Exam: Blood pressure (!) 180/82, pulse 82, temperature 97.9 F (36.6 C), temperature source Oral, resp. rate 16, SpO2 100 %. Physical Exam Constitutional:      General: She is not in acute distress.    Appearance: She is not ill-appearing.  HENT:     Head: Normocephalic.   Eyes:     General:        Right eye: No discharge.        Left eye: No discharge.     Conjunctiva/sclera: Conjunctivae normal.  Cardiovascular:     Rate and Rhythm: Normal rate and regular rhythm.  Pulmonary:     Effort: Pulmonary effort is normal. No respiratory distress.     Breath sounds: Normal breath sounds. No wheezing.  Abdominal:     General: Bowel sounds are normal. There is no distension.     Palpations: Abdomen is soft.     Tenderness: There is no abdominal tenderness.  Musculoskeletal:     Cervical back: Normal range of motion.     Comments: No wound or ulcer noted on the left foot. There is an area of hyperpigmentation on the dorsal surface of the right big toe and second toe.  There is a fluctuant area palpated underneath the second toe.  Trace edema of right foot > left foot.  Wound is not actively draining. Diminished pedis pulses palpated, Right > Left Good thrill palpated in the left AV fistula  Skin:    General: Skin is warm.  Neurological:     Mental Status: She is alert.  Psychiatric:        Mood and Affect: Mood normal.          Assessment & Plan by Problem: Principal Problem:   Diabetic foot infection ([place]) Active Problems:   Essential hypertension   Uncontrolled type 2 diabetes mellitus with hyperglycemia (HCC)   End stage renal disease on dialysis (HCC)   Anemia in chronic kidney disease   Chronic HFrEF (heart failure with reduced ejection fraction) (HCC)   [name] (coronary artery disease) of artery bypass graft  Melissa Howe is a 71 yo female living with type 2 diabetes, ESRD on HD, [name], HFrEF, hypertension, hyperlipidemia who presents to the emergency room for right foot wound, admitted for IV antibiotics and osteomyelitis ruled out.  Diabetic foot wound The wound does appear to be infected with a fluctuant area palpated underneath the second toe.  Pending MRI result to confirm or rule out osteomyelitis.  Podiatry was consulted and will evaluate  patient.  Patient is currently hemodynamically stable and not septic. -Continue vancomycin, ceftriaxone and metronidazole -Keep n.p.o. until MRI comes back -Obtain ABI -Follow-up blood culture  ESRD on HD TTS No signs of volume load on exam.  No significant electrolyte derangement. -Consulted nephrology for HD.  She is due for her HD session today  Uncontrolled type 2 diabetes A1c was 8.8 in September.  Could be falsely low in the setting of ESRD  and anemia.  She is reportedly taking 30 units of Levemir at bedtime. -Resume Levemir 15 units QHS while n.p.o. -Sliding scale insulin -Keep CBG < 180 to promote wound healing  Chronic HFrEF [name] Hypertension No signs of overt volume overload on exam.  EF 35-40% in 2022.  Patient had a left heart cath and PCI to LAD in 2022.  Limited GDMT due to ESRD. -Resume amlodipine, hydralazine and Imdur -Hold aspirin and Plavix for possible surgery -Resume atorvastatin  Chronic normocytic anemia Likely anemia of CKD. -Check ferritin and iron study  N.p.o. for now CODE STATUS: No chest compression or defibrillation.  Yes for intubation IVF: N/A DVT: SCD for possible surgery  Dispo: Admit patient to Observation with expected length of stay less than 2 midnights.  Signed: Gaylan Gerold, DO 10/07/2022, 10:48 AM  Pager: [phone] After 5pm on weekdays and 1pm on weekends: On Call pager: [phone]

## 2022-10-07 NOTE — Progress Notes (Signed)
Pharmacy Antibiotic Note  Melissa Howe is a 71 y.o. female admitted on 10/06/2022 with  foot infection .  Pharmacy has been consulted for vancomycin dosing. Patient is ESRD with HD on T,Th,Sa  Vanc 1500mg  ordered as loading dose this morning but it was not given. Pt received 750mg  IV with HD today.  Plan: Give another 750mg  vancomycin IV now to complete load then  Vancomycin 750mg  with HD Will f/u HD schedule/tolerance, micro data, and pt's clinical condition Vanc level prn  Height: 5\' 4"  (162.6 cm) Weight: 77.3 kg (170 lb 6.7 oz) IBW/kg (Calculated) : 54.7  Temp (24hrs), Avg:97.9 F (36.6 C), Min:97.7 F (36.5 C), Max:98 F (36.7 C)  Recent Labs  Lab 10/07/22 0343  WBC 10.2  CREATININE 8.74*  LATICACIDVEN 1.2     Estimated Creatinine Clearance: 5.9 mL/min (A) (by C-G formula based on SCr of 8.74 mg/dL (H)).    No Known Allergies  Antimicrobials this admission: Vancomycin 12/12 >> Ceftriaxone 12/12 >> Flagyl 12/12>>   Microbiology results: 12/12 BCx:   Thank you for allowing pharmacy to be a part of this patient's care.  Sherlon Handing, PharmD, BCPS Please see amion for complete clinical pharmacist phone list 10/07/2022 8:22 PM

## 2022-10-07 NOTE — Progress Notes (Signed)
Pharmacy Antibiotic Note  Melissa Howe is a 71 y.o. female admitted on 10/06/2022 with  foot infection .  Pharmacy has been consulted for vancomycin dosing. Patient is ESRD with HD on T,Th,Sa  Plan: Vancomycin 1500mg  x1 then 750mg  with HD     Temp (24hrs), Avg:97.9 F (36.6 C), Min:97.9 F (36.6 C), Max:97.9 F (36.6 C)  Recent Labs  Lab 10/07/22 0343  WBC 10.2  CREATININE 8.74*  LATICACIDVEN 1.2    CrCl cannot be calculated (Unknown ideal weight.).    No Known Allergies  Antimicrobials this admission: Vancomycin 12/12 >> Ceftriaxone 12/12 >> Flagyl 12/12>>   Microbiology results: pending  Thank you for allowing pharmacy to be a part of this patient's care.  Jodean Lima Tyton Abdallah 10/07/2022 6:09 AM

## 2022-10-07 NOTE — Progress Notes (Signed)
Received patient in bed to unit.  Alert and oriented.  Informed consent signed and in chart.   Treatment initiated: 1530  Treatment completed: 1853  Patient tolerated well.  Transported back to the room  Alert, without acute distress.  Hand-off given to patient's nurse.   Access used: AVF Access issues: None  Total UF removed: 2.5L Medication(s) given: Vanc Post HD VS: 187/84,79,100%16,97.8 Post HD weight: 77.3kg   [facility]

## 2022-10-07 NOTE — Consult Note (Signed)
Reason for Consult: Right foot ulcer and infection Referring Physician: Dr Sharlyne Melissa Howe is an 71 y.o. female.  HPI: She has a history of type 2 diabetes and ESRD on HD.  Her daughter is here as well.  They are not sure how long the wound has been here.  She noticed that yesterday and came to the ER because it was worsening was worried about infection  Past Medical History:  Diagnosis Date   Anemia    Chronic kidney disease    Constipation    Diabetes mellitus    Type II   Dyspnea    with exertion   History of blood transfusion    Hyperlipemia    Hypertension    Pneumonia 2019    Past Surgical History:  Procedure Laterality Date   APPENDECTOMY     AV FISTULA PLACEMENT Left 10/05/2019   Procedure: INSERTION OF ARTERIOVENOUS (AV) GORE-TEX VASCULAR GRAFT LEFT ARM;  Surgeon: [name], MD;  Location: [place];  Service: Vascular;  Laterality: Left;   AV FISTULA PLACEMENT Left 02/27/2021   Procedure: LEFT ARM FIRST STAGE BASILIC VEIN  ARTERIOVENOUS (AV) FISTULA CREATION;  Surgeon: Waynetta Sandy, MD;  Location: [facility];  Service: Vascular;  Laterality: Left;   [place] Left 04/17/2021   Procedure: LEFT SECOND STAGE [place];  Surgeon: Waynetta Sandy, MD;  Location: [place];  Service: Vascular;  Laterality: Left;   BIOPSY  07/24/2020   Procedure: BIOPSY;  Surgeon: Doran Stabler, MD;  Location: [place];  Service: Gastroenterology;;   BUBBLE STUDY  01/02/2020   Procedure: BUBBLE STUDY;  Surgeon: Elouise Munroe, MD;  Location: [facility];  Service: Cardiology;;   COLONOSCOPY WITH PROPOFOL N/A 07/24/2020   Procedure: COLONOSCOPY WITH PROPOFOL;  Surgeon: Doran Stabler, MD;  Location: [place];  Service: Gastroenterology;  Laterality: N/A;   CORONARY STENT INTERVENTION N/A 07/12/2021   Procedure: CORONARY STENT INTERVENTION;  Surgeon: Jettie Booze, MD;  Location: [place] [facility];  Service:  Cardiovascular;  Laterality: N/A;   ESOPHAGOGASTRODUODENOSCOPY (EGD) WITH PROPOFOL N/A 07/24/2020   Procedure: ESOPHAGOGASTRODUODENOSCOPY (EGD) WITH PROPOFOL;  Surgeon: Doran Stabler, MD;  Location: [place];  Service: Gastroenterology;  Laterality: N/A;   EYE SURGERY Bilateral    Cataract   FOREIGN BODY REMOVAL Left 05/13/2018   Procedure: FOREIGN BODY REMOVAL left foot;  Surgeon: Meredith Pel, MD;  Location: [place];  Service: Orthopedics;  Laterality: Left;   I & D EXTREMITY Left 05/21/2018   Procedure: LEFT FOOT DEBRIDEMENT AND WOUND CLOSURE;  Surgeon: Newt Minion, MD;  Location: [place];  Service: Orthopedics;  Laterality: Left;   INTRAVASCULAR ULTRASOUND/IVUS N/A 07/12/2021   Procedure: Intravascular Ultrasound/IVUS;  Surgeon: Jettie Booze, MD;  Location: [place] [facility];  Service: Cardiovascular;  Laterality: N/A;   LEFT HEART CATH AND CORONARY ANGIOGRAPHY N/A 07/12/2021   Procedure: LEFT HEART CATH AND CORONARY ANGIOGRAPHY;  Surgeon: Jettie Booze, MD;  Location: [place] [facility];  Service: Cardiovascular;  Laterality: N/A;   POLYPECTOMY  07/24/2020   Procedure: POLYPECTOMY;  Surgeon: Doran Stabler, MD;  Location: [place];  Service: Gastroenterology;;   TEE WITHOUT CARDIOVERSION N/A 01/02/2020   Procedure: TRANSESOPHAGEAL ECHOCARDIOGRAM (TEE);  Surgeon: Elouise Munroe, MD;  Location: [place];  Service: Cardiology;  Laterality: N/A;   TONSILLECTOMY     TUBAL LIGATION      Family History  Problem Relation  Age of Onset   Hypertension Mother    Diabetes Mother    Hypertension Sister     Social History:  reports that she quit smoking about 38 years ago. Her smoking use included cigarettes. She has never used smokeless tobacco. She reports that she does not drink alcohol and does not use drugs.  Allergies: No Known Allergies  Medications: I have reviewed the patient's current medications.  Results for orders placed or performed during  the hospital encounter of 10/06/22 (from the past 48 hour(s))  Comprehensive metabolic panel     Status: Abnormal   Collection Time: 10/07/22  3:43 AM  Result Value Ref Range   Sodium 135 135 - 145 mmol/L   Potassium 4.7 3.5 - 5.1 mmol/L   Chloride 96 (L) 98 - 111 mmol/L   CO2 21 (L) 22 - 32 mmol/L   Glucose, Bld 331 (H) 70 - 99 mg/dL    Comment: Glucose reference range applies only to samples taken after fasting for at least 8 hours.   BUN 83 (H) 8 - 23 mg/dL   Creatinine, Ser 8.74 (H) 0.44 - 1.00 mg/dL   Calcium 9.6 8.9 - 10.3 mg/dL   Total Protein 7.0 6.5 - 8.1 g/dL   Albumin 3.7 3.5 - 5.0 g/dL   AST 20 15 - 41 U/L   ALT 18 0 - 44 U/L   Alkaline Phosphatase 110 38 - 126 U/L   Total Bilirubin 0.5 0.3 - 1.2 mg/dL   GFR, Estimated 4 (L) >60 mL/min    Comment: (NOTE) Calculated using the CKD-EPI Creatinine Equation (2021)    Anion gap 18 (H) 5 - 15    Comment: Performed at [facility], [place] [address], [place], [place] [address]  CBC with Differential     Status: Abnormal   Collection Time: 10/07/22  3:43 AM  Result Value Ref Range   WBC 10.2 4.0 - 10.5 K/uL   RBC 3.69 (L) 3.87 - 5.11 MIL/uL   Hemoglobin 10.8 (L) 12.0 - 15.0 g/dL   HCT 33.6 (L) 36.0 - 46.0 %   MCV 91.1 80.0 - 100.0 fL   MCH 29.3 26.0 - 34.0 pg   MCHC 32.1 30.0 - 36.0 g/dL   RDW 14.1 11.5 - 15.5 %   Platelets 277 150 - 400 K/uL   nRBC 0.0 0.0 - 0.2 %   Neutrophils Relative % 55 %   Neutro Abs 5.6 1.7 - 7.7 K/uL   Lymphocytes Relative 33 %   Lymphs Abs 3.3 0.7 - 4.0 K/uL   Monocytes Relative 8 %   Monocytes Absolute 0.8 0.1 - 1.0 K/uL   Eosinophils Relative 4 %   Eosinophils Absolute 0.4 0.0 - 0.5 K/uL   Basophils Relative 0 %   Basophils Absolute 0.0 0.0 - 0.1 K/uL   Immature Granulocytes 0 %   Abs Immature Granulocytes 0.03 0.00 - 0.07 K/uL    Comment: Performed at [facility] [address] [place], [place] [address]  Lactic acid     Status: None   Collection Time: 10/07/22  3:43 AM   Result Value Ref Range   Lactic Acid, Venous 1.2 0.5 - 1.9 mmol/L    Comment: Performed at [facility] [address], [place],  [address]  CBG monitoring, ED     Status: Abnormal   Collection Time: 10/07/22 12:21 PM  Result Value Ref Range   Glucose-Capillary 286 (H) 70 - 99 mg/dL  Comment: Glucose reference range applies only to samples taken after fasting for at least 8 hours.    MR FOOT RIGHT WO CONTRAST  Result Date: 10/07/2022 CLINICAL DATA:  Diabetic foot wound. EXAM: MRI OF THE RIGHT FOREFOOT WITHOUT CONTRAST TECHNIQUE: Multiplanar, multisequence MR imaging of the right foot was performed. No intravenous contrast was administered. COMPARISON:  Radiographs 10/07/2022 FINDINGS: Subcutaneous soft tissue swelling/edema mainly involving the dorsum of the foot. No discrete fluid collection to suggest a drainable abscess. No gas is seen in the soft tissues. Suspect a wound involving the plantar aspect of the second toe. No abscess is identified. Very poor distal fat saturation but I do not see any definite T1 signal abnormality to suggest osteomyelitis. No findings suspicious for septic arthritis. Moderate to advanced midfoot degenerative changes. Abnormal T1 and T2 signal intensity in the base of the second metatarsal could be degenerative or stress related. No discrete stress fracture. Mild diffuse myositis without findings suspicious for myofasciitis. The major tendons and ligaments appear intact. IMPRESSION: 1. Subcutaneous soft tissue swelling/edema mainly involving the dorsum of the foot. No discrete fluid collection to suggest a drainable abscess. 2. Suspect a wound involving the plantar aspect of the second toe. No abscess is identified. 3. Limited examination but no definite MR findings for osteomyelitis or septic arthritis. 4. Moderate to advanced midfoot degenerative changes. 5. Abnormal T1 and T2 signal intensity in the base of the second metatarsal could be degenerative  or stress related. No discrete stress fracture. 6. Mild diffuse myositis without findings suspicious for myofasciitis. Electronically Signed   By: Marijo Sanes M.D.   On: 10/07/2022 12:03   DG Foot Complete Right  Result Date: 10/07/2022 CLINICAL DATA:  Soft tissue wound between the first and second digits EXAM: RIGHT FOOT COMPLETE - 3+ VIEW COMPARISON:  None Available. FINDINGS: Degenerative changes of first MTP joint are noted. No acute fracture or dislocation is seen. Tarsal degenerative changes and calcaneal spurring is noted. Diffuse vascular calcifications are seen. Mild soft tissue swelling is noted distally in the foot. IMPRESSION: No erosive changes to suggest osteomyelitis. Mild soft tissue swelling is noted. Electronically Signed   By: Inez Catalina M.D.   On: 10/07/2022 04:00    Review of Systems  Constitutional:  Negative for chills, fever and malaise/fatigue.  Skin:        Wound on right second toe and first toe  All other systems reviewed and are negative.  Blood pressure (!) 185/77, pulse 82, temperature 97.8 F (36.6 C), temperature source Oral, resp. rate 16, SpO2 100 %.  Vitals:   10/07/22 1123 10/07/22 1250  BP:  (!) 185/77  Pulse:  82  Resp:  16  Temp: 97.7 F (36.5 C) 97.8 F (36.6 C)  SpO2:  100%    General AA&O x3. Normal mood and affect.  Vascular Foot is warm and well-perfused unable to palpate pulses directly good capillary fill time  Neurologic Epicritic sensation grossly absent.  Dermatologic (Wound) Full-thickness ulcer of second toe with exposed subcutaneous tissue, superficial ulceration and blistering of plantar first MPJ  Orthopedic: Motor intact BLE.    Assessment/Plan:  Diabetic foot ulcer right foot -Imaging: Studies independently reviewed.  No evidence of osteomyelitis on MRI.  I reviewed these as well as myself -Antibiotics: Can continue broad-spectrum antibiotics for diabetic foot infection per renal dosing.  I took a culture of the  drainage under the blister today and hopefully this can guide outpatient p.o. therapy -WB Status: WBAT -Wound Care: I  was able to drain and lanced the blister today and took a culture of the drainage.  Betadine dressing applied.  We will reevaluate tomorrow and recommend further wound care at that point -Surgical Plan: No further surgical plans -ABI ordered recommend evaluating this to ensure that she has sufficient blood flow to heal her wound. -Diet ordered she does not need to be n.p.o.  Melissa Howe 10/07/2022, 12:58 PM   Best available via secure chat for questions or concerns.

## 2022-10-07 NOTE — ED Provider Triage Note (Signed)
Emergency Medicine Provider Triage Evaluation Note  Melissa Howe , a 71 y.o. female  was evaluated in triage.  Pt complains of wound to the right second toe and right great toe which she noticed for the first time yesterday, states she does not have any pain secondary to her diabetic neuropathy.  Foul odor, scant discharge.  No fevers or chills.  Has an appointment with the podiatrist 10 days from now.  Review of Systems  Positive: As above Negative: As above  Physical Exam  BP (!) 198/82 (BP Location: Right Arm)   Pulse 93   Temp 97.9 F (36.6 C) (Oral)   Resp 16   SpO2 100%  Gen:   Awake, no distress   Resp:  Normal effort  MSK:   Moves extremities without difficulty  Other:  Wounds to the plantar surface of the second toe on the right, darkening of the tissue in the right great and second toe but 2+ pedal pulse on the right.  Decree sensation in all toes secondary to peripheral neuropathy  Medical Decision Making  Medically screening exam initiated at 3:27 AM.  Appropriate orders placed.  Melissa Howe was informed that the remainder of the evaluation will be completed by another provider, this initial triage assessment does not replace that evaluation, and the importance of remaining in the ED until their evaluation is complete.  Order set with lower extremity wound initiated.  This chart was dictated using voice recognition software, Dragon. Despite the best efforts of this provider to proofread and correct errors, errors may still occur which can change documentation meaning.    Emeline Darling, PA-C 10/07/22 [phone]

## 2022-10-07 NOTE — ED Notes (Signed)
2W called to initiate purpleman; RN Charge reviewing now

## 2022-10-07 NOTE — ED Triage Notes (Signed)
Pt presents with R great toe and second toe pain. Drainage and foul odor noted. Unknown when it started but looks like old wound. Pt just noticed it today. Has never had wound like this before. Diabetic. No pain in foot due to neuropathy.

## 2022-10-07 NOTE — Procedures (Signed)
   I was present at this dialysis session, have reviewed the session itself and made  appropriate changes Kelly Splinter MD [place] pager [phone]   10/07/2022, 4:56 PM

## 2022-10-07 NOTE — ED Provider Notes (Signed)
[facility] EMERGENCY DEPARTMENT Provider Note   CSN: [id number] Arrival date & time: 10/06/22  2015     History  Chief Complaint  Patient presents with   Foot Injury    Melissa Howe is a 71 y.o. female with ESRD on HD, HTN, HLD, T2DM, anemia, PVD, protein-calorie malnutrition, [name] h/o NSTEMI who  presents with foot injury.   Pt complains of wound to the right second toe and right great toe which she noticed for the first time yesterday, states she does not have any pain secondary to her diabetic neuropathy. Talked with her daughter who suggested she go to the emergency department.  Otherwise has felt well in her normal state of health.  Denies any nausea/vomiting/body aches, or fever/chills.  Endorses foul odor, scant discharge.  Has an appointment with the podiatrist 10 days from now. No h/o surgery on that foot.  She does not remember any inciting trauma.  Foot Injury      Home Medications Prior to Admission medications   Medication Sig Start Date End Date Taking? Authorizing Provider  amLODipine (NORVASC) 5 MG tablet Take 5 mg by mouth daily.   Yes [provider]  aspirin 81 MG chewable tablet Chew 1 tablet (81 mg total) by mouth daily. 07/13/21  Yes Ghimire, Henreitta Leber, MD  atorvastatin (LIPITOR) 80 MG tablet Take 1 tablet (80 mg total) by mouth daily. Patient taking differently: Take 80 mg by mouth at bedtime. 07/13/21  Yes Ghimire, Henreitta Leber, MD  AURYXIA 1 GM 210 MG(Fe) tablet Take 630 mg by mouth See admin instructions. Take 3tablets (630 mg) by mouth each meal and each snack 06/18/20  Yes [provider]  hydrALAZINE (APRESOLINE) 25 MG tablet Take 1 tablet (25 mg total) by mouth every 8 (eight) hours. 07/13/21  Yes Ghimire, Henreitta Leber, MD  Hyprom-Naphaz-Polysorb-Zn Sulf (CLEAR EYES COMPLETE OP) Place 1 drop into both eyes daily as needed (dry eyes).   Yes [provider]  Insulin Detemir (LEVEMIR) 100 UNIT/ML Pen Inject 18 Units  into the skin daily with breakfast. Patient taking differently: Inject 18 Units into the skin at bedtime. 08/11/19  Yes Dahal, Marlowe Aschoff, MD  isosorbide mononitrate (IMDUR) 30 MG 24 hr tablet Take 1 tablet (30 mg total) by mouth daily. 07/13/21  Yes Ghimire, Henreitta Leber, MD  metoCLOPramide (REGLAN) 5 MG tablet Take 5 mg by mouth 3 (three) times daily before meals. 09/19/22  Yes [provider]  multivitamin (RENA-VIT) TABS tablet Take 1 tablet by mouth daily. 07/10/22  Yes [provider]  acetaminophen (TYLENOL) 500 MG tablet Take 1,000 mg by mouth every 6 (six) hours as needed for moderate pain or headache. Patient not taking: Reported on 10/07/2022    [provider]  albuterol (VENTOLIN HFA) 108 (90 Base) MCG/ACT inhaler SMARTSIG:1-2 Puff(s) By Mouth Every 6 Hours PRN Patient not taking: Reported on 10/07/2022 09/02/21   [provider]  calcitRIOL (ROCALTROL) 0.5 MCG capsule Take 5 capsules (2.5 mcg total) by mouth Every Tuesday,Thursday,and Saturday with dialysis. Patient not taking: Reported on 10/07/2022 07/24/20   Flora Lipps, MD  clopidogrel (PLAVIX) 75 MG tablet Take 1 tablet (75 mg total) by mouth daily with breakfast. Patient not taking: Reported on 10/07/2022 07/13/21   Jonetta Osgood, MD  Insulin Pen Needle 32G X 4 MM MISC Use with insulin pen to dispense insulin as directed 09/08/16   Tat, Shanon Brow, MD  lubiprostone (AMITIZA) 24 MCG capsule Take 24 mcg by mouth 2 (two)  times daily as needed for constipation. Patient not taking: Reported on 10/07/2022 01/22/21   [provider]      Allergies    Patient has no known allergies.    Review of Systems   Review of Systems Review of systems Negative for f/c.  A 10 point review of systems was performed and is negative unless otherwise reported in HPI.  Physical Exam Updated Vital Signs BP (!) 180/82   Pulse 82   Temp 97.9 F (36.6 C) (Oral)   Resp 16   SpO2 100%  Physical Exam General:  Normal appearing female, lying in bed.  HEENT: PERRLA, Sclera anicteric, MMM, trachea midline.  Cardiology: RRR, no murmurs/rubs/gallops. BL radial and DP pulses equal bilaterally.  Resp: Normal respiratory rate and effort. CTAB, no wheezes, rhonchi, crackles.  Abd: Soft, non-tender, non-distended. No rebound tenderness or guarding.  GU: Deferred. MSK: Moist hypopigmentation/discoloration to inferior dorsal aspect of R 1st and second toes with open wound and blanched tissue visible with black discoloration. Fluid visible under skin of 1st MCP joint. No TTP. Mild surrounding erythema and swelling. Please see media. Skin: warm, dry. No rashes or lesions. Neuro: A&Ox4, CNs II-XII grossly intact. MAEs. Sensation grossly intact.  Psych: Normal mood and affect.       ED Results / Procedures / Treatments   Labs (all labs ordered are listed, but only abnormal results are displayed) Labs Reviewed  COMPREHENSIVE METABOLIC PANEL - Abnormal; Notable for the following components:      Result Value   Chloride 96 (*)    CO2 21 (*)    Glucose, Bld 331 (*)    BUN 83 (*)    Creatinine, Ser 8.74 (*)    GFR, Estimated 4 (*)    Anion gap 18 (*)    All other components within normal limits  CBC WITH DIFFERENTIAL/PLATELET - Abnormal; Notable for the following components:   RBC 3.69 (*)    Hemoglobin 10.8 (*)    HCT 33.6 (*)    All other components within normal limits  CULTURE, BLOOD (ROUTINE X 2)  CULTURE, BLOOD (ROUTINE X 2)  LACTIC ACID, PLASMA    EKG None  Radiology DG Foot Complete Right  Result Date: 10/07/2022 CLINICAL DATA:  Soft tissue wound between the first and second digits EXAM: RIGHT FOOT COMPLETE - 3+ VIEW COMPARISON:  None Available. FINDINGS: Degenerative changes of first MTP joint are noted. No acute fracture or dislocation is seen. Tarsal degenerative changes and calcaneal spurring is noted. Diffuse vascular calcifications are seen. Mild soft tissue swelling is noted  distally in the foot. IMPRESSION: No erosive changes to suggest osteomyelitis. Mild soft tissue swelling is noted. Electronically Signed   By: Inez Catalina M.D.   On: 10/07/2022 04:00    Procedures Procedures    Medications Ordered in ED Medications  cefTRIAXone (ROCEPHIN) 2 g in sodium chloride 0.9 % 100 mL IVPB (0 g Intravenous Stopped 10/07/22 0858)  metroNIDAZOLE (FLAGYL) tablet 500 mg (500 mg Oral Given 10/07/22 0636)  vancomycin (VANCOREADY) IVPB 1500 mg/300 mL (has no administration in time range)  vancomycin (VANCOREADY) IVPB 750 mg/150 mL (has no administration in time range)    ED Course/ Medical Decision Making/ A&P                          Medical Decision Making Amount and/or Complexity of Data Reviewed Labs:  Decision-making details documented in ED Course. Radiology: ordered. Decision-making details documented in  ED Course.  Risk Decision regarding hospitalization.   This patient presents to the ED for concern of diabetic foot infection, this involves an extensive number of treatment options, and is a complaint that carries with it a high risk of complications and morbidity.  I considered the following differential and admission for this acute, potentially life threatening condition.  At this time she is hemodynamically stable nontoxic-appearing, afebrile.  MDM:    Concern for wet gangrene of toes given discoloration, open tissue visible with black discoloration. Concern for deep space foot infection, abscess, or osteomyelitis.  Patient just noticed the wound yesterday but has diabetic neuropathy no feeling in her feet at baseline, unclear when it would have started.  Lower concern at this time for necrotizing infection or sepsis.   Patient does follow with podiatry so we will consult podiatry and likely admit for IV antibiotics.  Patient already has received IV ceftriaxone, vancomycin, and p.o. Flagyl that was ordered by the triage provider.  Clinical Course as of  10/07/22 1021  Tue Oct 07, 2022  0728 DG Foot Complete Right FINDINGS: Degenerative changes of first MTP joint are noted. No acute fracture or dislocation is seen. Tarsal degenerative changes and calcaneal spurring is noted. Diffuse vascular calcifications are seen. Mild soft tissue swelling is noted distally in the foot.   IMPRESSION: No erosive changes to suggest osteomyelitis.   Mild soft tissue swelling is noted.  [HN]  0728 WBC: 10.2 [HN]  0728 Lactic Acid, Venous: 1.2 [HN]  0728 Hemoglobin(!): 10.8 [HN]  0952 Podiatry aware of patient and will come to see her. Will consult to medicine for admission. [HN]    Clinical Course User Index [HN] Audley Hose, MD     Labs: I Ordered, and personally interpreted labs.  The pertinent results include: No leukocytosis or elevated lactate.  Hemoglobin 10.8.  Imaging Studies ordered: I ordered imaging studies including DG foot x-ray, MRI foot I independently visualized and interpreted imaging. I agree with the radiologist interpretation  Additional history obtained from chart review.   Cardiac Monitoring: The patient was maintained on a cardiac monitor.  I personally viewed and interpreted the cardiac monitored which showed an underlying rhythm of: NSR  Reevaluation: After the interventions noted above, I reevaluated the patient and found that they have :stayed the same  Social Determinants of Health:  patient lives independently  Disposition: Admitted to hospitalist for diabetic foot infection on IV antibiotics with podiatry following, MRI pending  Co morbidities that complicate the patient evaluation  Past Medical History:  Diagnosis Date   Anemia    Chronic kidney disease    Constipation    Diabetes mellitus    Type II   Dyspnea    with exertion   History of blood transfusion    Hyperlipemia    Hypertension    Pneumonia 2019     Medicines Meds ordered this encounter  Medications   cefTRIAXone (ROCEPHIN) 2 g  in sodium chloride 0.9 % 100 mL IVPB    Order Specific Question:   Antibiotic Indication:    Answer:   Other Indication (list below)    Order Specific Question:   Other Indication:    Answer:   Suspected diabetic foot infection.   metroNIDAZOLE (FLAGYL) tablet 500 mg   DISCONTD: cefTRIAXone (ROCEPHIN) 2 g in sodium chloride 0.9 % 100 mL IVPB    Order Specific Question:   Antibiotic Indication:    Answer:   Other Indication (list below)    Order  Specific Question:   Other Indication:    Answer:   Suspected diabetic foot infection.   DISCONTD: metroNIDAZOLE (FLAGYL) tablet 500 mg   vancomycin (VANCOREADY) IVPB 1500 mg/300 mL    Order Specific Question:   Indication:    Answer:   Other Indication (list below)    Order Specific Question:   Other Indication:    Answer:   foot infection   vancomycin (VANCOREADY) IVPB 750 mg/150 mL    Order Specific Question:   Indication:    Answer:   Other Indication (list below)    Order Specific Question:   Other Indication:    Answer:   foot infection    I have reviewed the patients home medicines and have made adjustments as needed  Problem List / ED Course: Problem List Items Addressed This Visit   None Visit Diagnoses     Diabetic infection of right foot ([place])    -  Primary   Relevant Medications   cefTRIAXone (ROCEPHIN) 2 g in sodium chloride 0.9 % 100 mL IVPB   metroNIDAZOLE (FLAGYL) tablet 500 mg   vancomycin (VANCOREADY) IVPB 1500 mg/300 mL   vancomycin (VANCOREADY) IVPB 750 mg/150 mL (Start on 10/07/2022 12:00 PM)               This note was created using dictation software, which may contain spelling or grammatical errors.    Audley Hose, MD 10/07/22 1022

## 2022-10-07 NOTE — Inpatient Diabetes Management (Signed)
Inpatient Diabetes Program Recommendations  AACE/ADA: New Consensus Statement on Inpatient Glycemic Control   Target Ranges:  Prepandial:   less than 140 mg/dL      Peak postprandial:   less than 180 mg/dL (1-2 hours)      Critically ill patients:  140 - 180 mg/dL    Latest Reference Range & Units 10/07/22 03:43  Glucose 70 - 99 mg/dL 331 (H)   Review of Glycemic Control  Diabetes history: DM2 Outpatient Diabetes medications: Levemir 18 units QHS Current orders for Inpatient glycemic control: None; in ED  Inpatient Diabetes Program Recommendations:    Insulin: Please consider ordering CBGs Q4H with Novolog 0-9 units Q4H. If patient will be admitted, also consider ordering Levemir 10 units Q24H.  NOTE: Patient is currently in the ED with wound on right foot with lab glucose of 331 mg/dl today at 3:43 am. .  Thanks, Barnie Alderman, RN, MSN, [place] Diabetes Coordinator Inpatient Diabetes Program [phone] (Team Pager from 8am to [place])

## 2022-10-07 NOTE — ED Notes (Addendum)
Transfer of care report given to green zone RN [place].

## 2022-10-08 ENCOUNTER — Observation Stay (HOSPITAL_COMMUNITY): Payer: Medicare Other

## 2022-10-08 DIAGNOSIS — M2041 Other hammer toe(s) (acquired), right foot: Secondary | ICD-10-CM | POA: Diagnosis present

## 2022-10-08 DIAGNOSIS — E440 Moderate protein-calorie malnutrition: Secondary | ICD-10-CM | POA: Diagnosis present

## 2022-10-08 DIAGNOSIS — L97512 Non-pressure chronic ulcer of other part of right foot with fat layer exposed: Secondary | ICD-10-CM | POA: Diagnosis present

## 2022-10-08 DIAGNOSIS — Z992 Dependence on renal dialysis: Secondary | ICD-10-CM | POA: Diagnosis not present

## 2022-10-08 DIAGNOSIS — L97509 Non-pressure chronic ulcer of other part of unspecified foot with unspecified severity: Secondary | ICD-10-CM | POA: Diagnosis not present

## 2022-10-08 DIAGNOSIS — Z794 Long term (current) use of insulin: Secondary | ICD-10-CM

## 2022-10-08 DIAGNOSIS — I5020 Unspecified systolic (congestive) heart failure: Secondary | ICD-10-CM | POA: Diagnosis not present

## 2022-10-08 DIAGNOSIS — Z79899 Other long term (current) drug therapy: Secondary | ICD-10-CM | POA: Diagnosis not present

## 2022-10-08 DIAGNOSIS — Z87891 Personal history of nicotine dependence: Secondary | ICD-10-CM | POA: Diagnosis not present

## 2022-10-08 DIAGNOSIS — N186 End stage renal disease: Secondary | ICD-10-CM

## 2022-10-08 DIAGNOSIS — E1151 Type 2 diabetes mellitus with diabetic peripheral angiopathy without gangrene: Secondary | ICD-10-CM | POA: Diagnosis present

## 2022-10-08 DIAGNOSIS — M898X9 Other specified disorders of bone, unspecified site: Secondary | ICD-10-CM | POA: Diagnosis present

## 2022-10-08 DIAGNOSIS — E785 Hyperlipidemia, unspecified: Secondary | ICD-10-CM | POA: Diagnosis present

## 2022-10-08 DIAGNOSIS — D631 Anemia in chronic kidney disease: Secondary | ICD-10-CM | POA: Diagnosis present

## 2022-10-08 DIAGNOSIS — I739 Peripheral vascular disease, unspecified: Secondary | ICD-10-CM | POA: Diagnosis not present

## 2022-10-08 DIAGNOSIS — E1122 Type 2 diabetes mellitus with diabetic chronic kidney disease: Secondary | ICD-10-CM | POA: Diagnosis present

## 2022-10-08 DIAGNOSIS — I252 Old myocardial infarction: Secondary | ICD-10-CM | POA: Diagnosis not present

## 2022-10-08 DIAGNOSIS — I132 Hypertensive heart and chronic kidney disease with heart failure and with stage 5 chronic kidney disease, or end stage renal disease: Secondary | ICD-10-CM

## 2022-10-08 DIAGNOSIS — E11628 Type 2 diabetes mellitus with other skin complications: Secondary | ICD-10-CM | POA: Diagnosis present

## 2022-10-08 DIAGNOSIS — I25810 Atherosclerosis of coronary artery bypass graft(s) without angina pectoris: Secondary | ICD-10-CM | POA: Diagnosis present

## 2022-10-08 DIAGNOSIS — Z7982 Long term (current) use of aspirin: Secondary | ICD-10-CM | POA: Diagnosis not present

## 2022-10-08 DIAGNOSIS — Z6828 Body mass index (BMI) 28.0-28.9, adult: Secondary | ICD-10-CM | POA: Diagnosis not present

## 2022-10-08 DIAGNOSIS — E1140 Type 2 diabetes mellitus with diabetic neuropathy, unspecified: Secondary | ICD-10-CM | POA: Diagnosis present

## 2022-10-08 DIAGNOSIS — E11621 Type 2 diabetes mellitus with foot ulcer: Secondary | ICD-10-CM | POA: Diagnosis present

## 2022-10-08 DIAGNOSIS — L97411 Non-pressure chronic ulcer of right heel and midfoot limited to breakdown of skin: Secondary | ICD-10-CM | POA: Diagnosis present

## 2022-10-08 DIAGNOSIS — L089 Local infection of the skin and subcutaneous tissue, unspecified: Secondary | ICD-10-CM | POA: Diagnosis not present

## 2022-10-08 DIAGNOSIS — E1165 Type 2 diabetes mellitus with hyperglycemia: Secondary | ICD-10-CM | POA: Diagnosis present

## 2022-10-08 DIAGNOSIS — I5022 Chronic systolic (congestive) heart failure: Secondary | ICD-10-CM | POA: Diagnosis present

## 2022-10-08 LAB — CBC
HCT: 31.3 % — ABNORMAL LOW (ref 36.0–46.0)
Hemoglobin: 10.4 g/dL — ABNORMAL LOW (ref 12.0–15.0)
MCH: 29.5 pg (ref 26.0–34.0)
MCHC: 33.2 g/dL (ref 30.0–36.0)
MCV: 88.7 fL (ref 80.0–100.0)
Platelets: 274 10*3/uL (ref 150–400)
RBC: 3.53 MIL/uL — ABNORMAL LOW (ref 3.87–5.11)
RDW: 14.1 % (ref 11.5–15.5)
WBC: 6.5 10*3/uL (ref 4.0–10.5)
nRBC: 0 % (ref 0.0–0.2)

## 2022-10-08 LAB — GLUCOSE, CAPILLARY
Glucose-Capillary: 109 mg/dL — ABNORMAL HIGH (ref 70–99)
Glucose-Capillary: 147 mg/dL — ABNORMAL HIGH (ref 70–99)
Glucose-Capillary: 216 mg/dL — ABNORMAL HIGH (ref 70–99)
Glucose-Capillary: 270 mg/dL — ABNORMAL HIGH (ref 70–99)
Glucose-Capillary: 290 mg/dL — ABNORMAL HIGH (ref 70–99)
Glucose-Capillary: 70 mg/dL (ref 70–99)

## 2022-10-08 LAB — BASIC METABOLIC PANEL
Anion gap: 12 (ref 5–15)
BUN: 40 mg/dL — ABNORMAL HIGH (ref 8–23)
CO2: 28 mmol/L (ref 22–32)
Calcium: 9 mg/dL (ref 8.9–10.3)
Chloride: 95 mmol/L — ABNORMAL LOW (ref 98–111)
Creatinine, Ser: 5.65 mg/dL — ABNORMAL HIGH (ref 0.44–1.00)
GFR, Estimated: 7 mL/min — ABNORMAL LOW (ref >60)
Glucose, Bld: 163 mg/dL — ABNORMAL HIGH (ref 70–99)
Potassium: 3.9 mmol/L (ref 3.5–5.1)
Sodium: 135 mmol/L (ref 135–145)

## 2022-10-08 LAB — IRON AND TIBC
Iron: 59 ug/dL (ref 28–170)
Saturation Ratios: 28 % (ref 10.4–31.8)
TIBC: 210 ug/dL — ABNORMAL LOW (ref 250–450)
UIBC: 151 ug/dL

## 2022-10-08 LAB — HEMOGLOBIN A1C
Hgb A1c MFr Bld: 9.9 % — ABNORMAL HIGH (ref 4.8–5.6)
Mean Plasma Glucose: 237 mg/dL

## 2022-10-08 LAB — FERRITIN: Ferritin: 1228 ng/mL — ABNORMAL HIGH (ref 11–307)

## 2022-10-08 LAB — HEPATITIS B SURFACE ANTIBODY, QUANTITATIVE: Hep B S AB Quant (Post): 102.8 m[IU]/mL (ref >9.9)

## 2022-10-08 MED ORDER — ASPIRIN 81 MG PO CHEW
81.0000 mg | CHEWABLE_TABLET | Freq: Every day | ORAL | Status: DC
  Administered 2022-10-08 – 2022-10-10 (×3): 81 mg via ORAL
  Filled 2022-10-08 (×3): qty 1

## 2022-10-08 MED ORDER — CHLORHEXIDINE GLUCONATE CLOTH 2 % EX PADS
6.0000 | MEDICATED_PAD | Freq: Every day | CUTANEOUS | Status: DC
  Administered 2022-10-08 – 2022-10-10 (×3): 6 via TOPICAL

## 2022-10-08 NOTE — Inpatient Diabetes Management (Signed)
Inpatient Diabetes Program Recommendations  AACE/ADA: New Consensus Statement on Inpatient Glycemic Control   Target Ranges:  Prepandial:   less than 140 mg/dL      Peak postprandial:   less than 180 mg/dL (1-2 hours)      Critically ill patients:  140 - 180 mg/dL    Latest Reference Range & Units 10/08/22 04:26 10/08/22 08:16  Glucose-Capillary 70 - 99 mg/dL 147 (H) 70    Latest Reference Range & Units 10/07/22 12:21 10/07/22 20:23 10/07/22 23:56  Glucose-Capillary 70 - 99 mg/dL 286 (H) 190 (H) 275 (H)   Review of Glycemic Control  Diabetes history: DM2 Outpatient Diabetes medications: Levemir 18 units QHS Current orders for Inpatient glycemic control: Levemir 15 units QHS, Novolog 0-6 units Q4H   Inpatient Diabetes Program Recommendations:     Insulin: Fasting glucose 70 mg/dl today. Please consider decreasing Levemir 10 units QHS.   Thanks, Barnie Alderman, RN, MSN, [place] Diabetes Coordinator Inpatient Diabetes Program [phone] (Team Pager from 8am to [place])

## 2022-10-08 NOTE — Progress Notes (Addendum)
HD#0 Subjective:   Summary: This is a 71 year old female with a past medical history of T1DM and ESRD on HD who presented with concerns of right foot wound. Patient admitted for further evaluation and management of diabetic foot infection.   Overnight Events: No overnight events  Patient reports that she is doing well. She denies any pain, fever, chills, or shortness of breath. She does ask when she would be able to go home.   Objective:  Vital signs in last 24 hours: Vitals:   10/07/22 1830 10/07/22 1921 10/08/22 0428 10/08/22 0500  BP: (!) 184/79  (!) 152/72   Pulse:   70   Resp:   17   Temp:   98.2 F (36.8 C)   TempSrc:   Oral   SpO2:   100%   Weight:  77.3 kg  77.9 kg  Height:       Supplemental O2: Room Air SpO2: 100 %   Physical Exam:  Constitutional: well-appearing, resting in bed in no acute distress  HENT: normocephalic atraumatic Eyes: conjunctiva non-erythematous Cardiovascular: regular rate and rhythm, no m/r/g; 2+ DP pulse on left foot Pulmonary/Chest: normal work of breathing on room air, lungs clear to auscultation bilaterally Ext: Patient with wrapping on the right foot with no signs of bleeding or drainage through the bandage.  Neurological: Patient seems to have more sensation in her right leg than her left leg on the medial side. 5/5 strength to left foot on dorsiflexion and plantarflexion.  Skin: Left foot with no wounds noted  Filed Weights   10/07/22 1258 10/07/22 1921 10/08/22 0500  Weight: 77.6 kg 77.3 kg 77.9 kg     Intake/Output Summary (Last 24 hours) at 10/08/2022 0550 Last data filed at 10/08/2022 0400 Gross per 24 hour  Intake 509.24 ml  Output 2500 ml  Net -1990.76 ml   Net IO Since Admission: -1,990.76 mL [10/08/22 0550]  Pertinent Labs:    Latest Ref Rng & Units 10/08/2022    3:07 AM 10/07/2022    3:43 AM 05/31/2022    6:08 AM  CBC  WBC 4.0 - 10.5 K/uL 6.5  10.2  9.3   Hemoglobin 12.0 - 15.0 g/dL 10.4  10.8  10.6    Hematocrit 36.0 - 46.0 % 31.3  33.6  32.4   Platelets 150 - 400 K/uL 274  277  253        Latest Ref Rng & Units 10/08/2022    3:07 AM 10/07/2022    3:43 AM 05/31/2022    6:08 AM  CMP  Glucose 70 - 99 mg/dL 163  331  275   BUN 8 - 23 mg/dL 40  83  57   Creatinine 0.44 - 1.00 mg/dL 5.65  8.74  6.42   Sodium 135 - 145 mmol/L 135  135  133   Potassium 3.5 - 5.1 mmol/L 3.9  4.7  4.3   Chloride 98 - 111 mmol/L 95  96  92   CO2 22 - 32 mmol/L 28  21  27    Calcium 8.9 - 10.3 mg/dL 9.0  9.6  9.6   Total Protein 6.5 - 8.1 g/dL  7.0    Total Bilirubin 0.3 - 1.2 mg/dL  0.5    Alkaline Phos 38 - 126 U/L  110    AST 15 - 41 U/L  20    ALT 0 - 44 U/L  18      Imaging: MR FOOT RIGHT WO CONTRAST  Result Date: 10/07/2022 CLINICAL DATA:  Diabetic foot wound. EXAM: MRI OF THE RIGHT FOREFOOT WITHOUT CONTRAST TECHNIQUE: Multiplanar, multisequence MR imaging of the right foot was performed. No intravenous contrast was administered. COMPARISON:  Radiographs 10/07/2022 FINDINGS: Subcutaneous soft tissue swelling/edema mainly involving the dorsum of the foot. No discrete fluid collection to suggest a drainable abscess. No gas is seen in the soft tissues. Suspect a wound involving the plantar aspect of the second toe. No abscess is identified. Very poor distal fat saturation but I do not see any definite T1 signal abnormality to suggest osteomyelitis. No findings suspicious for septic arthritis. Moderate to advanced midfoot degenerative changes. Abnormal T1 and T2 signal intensity in the base of the second metatarsal could be degenerative or stress related. No discrete stress fracture. Mild diffuse myositis without findings suspicious for myofasciitis. The major tendons and ligaments appear intact. IMPRESSION: 1. Subcutaneous soft tissue swelling/edema mainly involving the dorsum of the foot. No discrete fluid collection to suggest a drainable abscess. 2. Suspect a wound involving the plantar aspect of the second  toe. No abscess is identified. 3. Limited examination but no definite MR findings for osteomyelitis or septic arthritis. 4. Moderate to advanced midfoot degenerative changes. 5. Abnormal T1 and T2 signal intensity in the base of the second metatarsal could be degenerative or stress related. No discrete stress fracture. 6. Mild diffuse myositis without findings suspicious for myofasciitis. Electronically Signed   By: Marijo Sanes M.D.   On: 10/07/2022 12:03    Assessment/Plan:   Principal Problem:   Diabetic foot infection ([place]) Active Problems:   Essential hypertension   Diabetic infection of right foot ([place])   Uncontrolled type 2 diabetes mellitus with hyperglycemia (HCC)   End stage renal disease on dialysis ([place])   Anemia in chronic kidney disease   Chronic HFrEF (heart failure with reduced ejection fraction) (HCC)   [name] (coronary artery disease) of artery bypass graft   Patient Summary: Melissa Howe is a 71 y.o.  female with a past medical history of T1DM and ESRD on HD who presented with concerns of right foot wound. Patient admitted for further evaluation and management of diabetic foot infection.   #Right diabetic foot infection  #Type II DM with long term insulin use  Osteomyelitis has been ruled out from MRI findings. Patient is on day 2 of antibiotics. She denies any pain or any concerns at this point. She denies any fevers or chills. She reports that she has good sensation to her legs except for her plantar surface. Cultures are currently growing Gram positive rods and gram positive cocci. On exam there is no drainage going through the wrapping and there is no obvious bleeding. Patient does seem to have decreased sensation on her left leg compared to her right on the medial aspect. Podiatry is not planning any procedures per charting.  -Continue to follow cultures and narrow antibiotics  -Podiatry following, appreciate recs -Continue with antibiotics, ceftriaxone day 2, vanc  day 2, flagyl day 2 -Ensure glucose remains <180 to have good wound healing  -ABIs pending  -Diabetes educator following  -Continue with levemir at 15 units daily, as glucose measuring less than 180   #ESRD on hemodialysis Patient scheduled for hemodialysis Tuesday, Thursday, Saturdays.  Patient did have hemodialysis yesterday, and she tolerated well.  She had 2.5 L pulled off.  No current concerns. -Nephrology following -HD scheduled for Thursday if patient remains inpatient -Avoid any nephrotoxic drugs -Monitor renal function panel  #Anemia of chronic  disease Iron workup did not point towards iron deficiency anemia.  This is likely anemia of chronic disease secondary to ESRD.  Hemoglobin stable at this point.  No acute signs of bleeding from wound. -Continue to monitor CBC  #[name] #Heart failure with reduced ejection fraction #Hypertension #HLD Most recent echo on 07/09/2022 showing reduced ejection fraction of 35 to 40%.  Patient is currently on hydralazine 25 mg every 8 hours, Imdur 30 mg daily.  Patient does seem to be on Coreg in the past in 2022.  Unclear why patient is not on this anymore.  Will continue to take a look into this.  As patient does have HFrEF, could benefit from Coreg.  It does not seem as if patient is on Plavix as patient reports that her provider discontinued this. -Continue hydralazine 25 mg every 8 hours -Continue Imdur 30 mg daily -Continue atorvastatin 80 mg daily -Resume home aspirin 81 mg daily -Consider adding Coreg after chart review  Diet: Carb-Modified IVF: None, VTE: SCDs Code: Partial  Dispo: Anticipated discharge to Home in 1 days pending clinical improvement.   [place] [facility] Resident PGY-1 [phone] Please contact the on call pager after 5 pm and on weekends at [phone].

## 2022-10-08 NOTE — Progress Notes (Signed)
[facility] Progress Note  Subjective: no c/o. 2.5 L off w/ HD yest  Vitals:   10/07/22 1921 10/08/22 0428 10/08/22 0500 10/08/22 0816  BP:  (!) 152/72  (!) 128/55  Pulse:  70  80  Resp:  17  16  Temp:  98.2 F (36.8 C)  98.1 F (36.7 C)  TempSrc:  Oral  Oral  SpO2:  100%  98%  Weight: 77.3 kg  77.9 kg   Height:        Exam: Gen alert, no distress  No jvd or bruits Chest clear bilat to bases RRR no MRG Abd soft ntnd no mass or ascites +bs Ext no LE edema Neuro is alert, Ox 3 , nf    LUA AVFB+bruit      Home meds include - norvasc 5, lipitor, hydralazine 25 tid, imdur 30 qd, prns    OP HD: TTS [facility] 4h  400/1.5  72.5kg  2/2 bath Heparin 2000   LUA AVF - last HD 12/9, post wt 72.7kg   - calcitriol 1.5 ug po tiw - mircera 30 ug q2, last 11/30, due 12/14     Assessment/ Plan: Diabetic foot infection - R foot w/ drainage/ odor. IV abx started, MRI pending. Plain films neg for osteo. Per pmd.  ESRD - on HD TTS. Has not missed any HD. Had HD last night here. HD tomorrow.  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 Reassess 12/14.  MBD ckd - CCa in range, phos pending. Cont po vdra. Add on phos.  HTN/ vol - Bp's high, resume home BP meds. Get vol down w/ HD.     Rob Gery Sabedra 10/08/2022, 10:32 AM   Recent Labs  Lab 10/07/22 0343 10/08/22 0307  HGB 10.8* 10.4*  ALBUMIN 3.7  --   CALCIUM 9.6 9.0  PHOS 5.2*  --   CREATININE 8.74* 5.65*  K 4.7 3.9   Recent Labs  Lab 10/08/22 0307  IRON 59  TIBC 210*  FERRITIN 1,228*   Inpatient medications:  amLODipine  5 mg Oral Daily   atorvastatin  80 mg Oral QHS   Chlorhexidine Gluconate Cloth  6 each Topical Q0600   hydrALAZINE  25 mg Oral Q8H   insulin aspart  0-6 Units Subcutaneous Q4H   insulin detemir  15 Units Subcutaneous QHS   isosorbide mononitrate  30 mg Oral Daily   metroNIDAZOLE  500 mg Oral BID    cefTRIAXone (ROCEPHIN)  IV 2 g (10/08/22 0818)   vancomycin 750  mg (10/07/22 1749)   polyethylene glycol

## 2022-10-08 NOTE — Congregational Nurse Program (Signed)
PODIATRY PROGRESS NOTE Patient Name: Melissa Howe  DOB 14-Dec-1950 DOA 10/06/2022  Hospital Day: 3  Assessment:  71 y.o. female with with uclerations to the plantar 2nd toe and hallux of the right foot most concerning for gangrenous changes related to PAD with superimposed infection now improved s/p bedside drainage of blister at the wound site and IV abx.  AF, VSS  WBC:  ESR/CRP:  Wound/Bone Cultures: bedside wound culture GPCs, rare GPR, few Group B strep  Imaging: MRI Right foot WO contrast: No evidence OM or abscess  Plan:  - No surgical plans - Recommend daily dressing change with adaptic, betadine gauze kerlix and ace wrap. This will continue until follow up.  - WBAT to RLE in post op shoe - Recommend completion of ABI/PVR testing - if abnormal recommend vascular consult for possible angio  - Continue IV abx pending further wound culture data, then ok with switch to PO abx x 7-10 days. Likely recommend augmentin 875-125 BID x 10 days - Pt has follow up with our office scheudled next week Dec 22, rec she keep that apt and follow up then for ongoing wound care/mgmt - Pt will be stable for discharge likely tomorrow from podiatry standpoint pending normal ABI/PVR study and wound culture resulted.        Everitt Amber, DPM [facility]    Subjective:  Pt seen bedside no complaints or concerns, says she is feeling better tha  yesterday. Denies pain to Right foot. Aware of plans for wound care and continued abx.   Objective:   Vitals:   10/08/22 0428 10/08/22 0816  BP: (!) 152/72 (!) 128/55  Pulse: 70 80  Resp: 17 16  Temp: 98.2 F (36.8 C) 98.1 F (36.7 C)  SpO2: 100% 98%       Latest Ref Rng & Units 10/08/2022    3:07 AM 10/07/2022    3:43 AM 05/31/2022    6:08 AM  CBC  WBC 4.0 - 10.5 K/uL 6.5  10.2  9.3   Hemoglobin 12.0 - 15.0 g/dL 10.4  10.8  10.6   Hematocrit 36.0 - 46.0 % 31.3  33.6  32.4   Platelets 150 - 400 K/uL 274  277  253         Latest Ref Rng & Units 10/08/2022    3:07 AM 10/07/2022    3:43 AM 05/31/2022    6:08 AM  BMP  Glucose 70 - 99 mg/dL 163  331  275   BUN 8 - 23 mg/dL 40  83  57   Creatinine 0.44 - 1.00 mg/dL 5.65  8.74  6.42   Sodium 135 - 145 mmol/L 135  135  133   Potassium 3.5 - 5.1 mmol/L 3.9  4.7  4.3   Chloride 98 - 111 mmol/L 95  96  92   CO2 22 - 32 mmol/L _0 Calcium 8.9 - 10.3 mg/dL 9.0  9.6  9.6     General: AAOx3, NAD  Lower Extremity Exam Vasc: R - PT non palpable, DP non palpable.   L - PT non palpable, DP non palpable.   Derm: R - Full-thickness ulcer of second toe with exposed subcutaneous tissue, superficial ulceration and blistering of plantar first MPJ . Dry necrotic wound base no purulence.     MSK:  R - Hammertoe deformities RLE  L -  No gross deformities. Compartments soft, non-tender, compressible  Neuro: R -  Gross sensation absent. Gross motor function intact   L - Gross sensation absent. Gross motor function intact   Radiology:  Results reviewed. See assessment for pertinent imaging results

## 2022-10-08 NOTE — Progress Notes (Signed)
Pt receives out-pt HD at [facility] on TTS. Will assist as needed.   Melven Sartorius Renal Navigator [phone]

## 2022-10-09 ENCOUNTER — Inpatient Hospital Stay (HOSPITAL_COMMUNITY): Payer: Medicare Other

## 2022-10-09 DIAGNOSIS — Z87891 Personal history of nicotine dependence: Secondary | ICD-10-CM

## 2022-10-09 DIAGNOSIS — I739 Peripheral vascular disease, unspecified: Secondary | ICD-10-CM

## 2022-10-09 DIAGNOSIS — E11621 Type 2 diabetes mellitus with foot ulcer: Secondary | ICD-10-CM

## 2022-10-09 DIAGNOSIS — L97509 Non-pressure chronic ulcer of other part of unspecified foot with unspecified severity: Secondary | ICD-10-CM

## 2022-10-09 LAB — RENAL FUNCTION PANEL
Albumin: 3 g/dL — ABNORMAL LOW (ref 3.5–5.0)
Anion gap: 14 (ref 5–15)
BUN: 63 mg/dL — ABNORMAL HIGH (ref 8–23)
CO2: 24 mmol/L (ref 22–32)
Calcium: 9.1 mg/dL (ref 8.9–10.3)
Chloride: 97 mmol/L — ABNORMAL LOW (ref 98–111)
Creatinine, Ser: 7.49 mg/dL — ABNORMAL HIGH (ref 0.44–1.00)
GFR, Estimated: 5 mL/min — ABNORMAL LOW (ref >60)
Glucose, Bld: 56 mg/dL — ABNORMAL LOW (ref 70–99)
Phosphorus: 6.2 mg/dL — ABNORMAL HIGH (ref 2.5–4.6)
Potassium: 4.2 mmol/L (ref 3.5–5.1)
Sodium: 135 mmol/L (ref 135–145)

## 2022-10-09 LAB — CBC
HCT: 31 % — ABNORMAL LOW (ref 36.0–46.0)
Hemoglobin: 10.4 g/dL — ABNORMAL LOW (ref 12.0–15.0)
MCH: 29.4 pg (ref 26.0–34.0)
MCHC: 33.5 g/dL (ref 30.0–36.0)
MCV: 87.6 fL (ref 80.0–100.0)
Platelets: 272 10*3/uL (ref 150–400)
RBC: 3.54 MIL/uL — ABNORMAL LOW (ref 3.87–5.11)
RDW: 13.9 % (ref 11.5–15.5)
WBC: 7.4 10*3/uL (ref 4.0–10.5)
nRBC: 0 % (ref 0.0–0.2)

## 2022-10-09 LAB — GLUCOSE, CAPILLARY
Glucose-Capillary: 68 mg/dL — ABNORMAL LOW (ref 70–99)
Glucose-Capillary: 107 mg/dL — ABNORMAL HIGH (ref 70–99)
Glucose-Capillary: 96 mg/dL (ref 70–99)
Glucose-Capillary: 192 mg/dL — ABNORMAL HIGH (ref 70–99)
Glucose-Capillary: 192 mg/dL — ABNORMAL HIGH (ref 70–99)
Glucose-Capillary: 226 mg/dL — ABNORMAL HIGH (ref 70–99)
Glucose-Capillary: 244 mg/dL — ABNORMAL HIGH (ref 70–99)

## 2022-10-09 MED ORDER — INSULIN DETEMIR 100 UNIT/ML ~~LOC~~ SOLN
10.0000 [IU] | Freq: Every day | SUBCUTANEOUS | Status: DC
  Filled 2022-10-09 (×2): qty 0.1

## 2022-10-09 MED ORDER — HEPARIN SODIUM (PORCINE) 5000 UNIT/ML IJ SOLN
5000.0000 [IU] | Freq: Three times a day (TID) | INTRAMUSCULAR | Status: DC
  Administered 2022-10-09 – 2022-10-10 (×3): 5000 [IU] via SUBCUTANEOUS
  Filled 2022-10-09 (×3): qty 1

## 2022-10-09 MED ORDER — HEPARIN SODIUM (PORCINE) 1000 UNIT/ML IJ SOLN
INTRAMUSCULAR | Status: AC
  Administered 2022-10-09: 2000 [IU]
  Filled 2022-10-09: qty 2

## 2022-10-09 MED ORDER — ONDANSETRON HCL 4 MG/2ML IJ SOLN
4.0000 mg | Freq: Four times a day (QID) | INTRAMUSCULAR | Status: DC | PRN
  Administered 2022-10-09: 4 mg via INTRAVENOUS
  Filled 2022-10-09: qty 2

## 2022-10-09 NOTE — Progress Notes (Signed)
[facility] Progress Note  Subjective: seen in room, no c/o  Vitals:   10/08/22 1922 10/09/22 0343 10/09/22 0500 10/09/22 0844  BP: 136/75 129/61  (!) 162/78  Pulse: 85 80  79  Resp: 18 17    Temp: 98.3 F (36.8 C) 97.9 F (36.6 C)  97.8 F (36.6 C)  TempSrc: Oral Oral  Oral  SpO2: 100% 100%  99%  Weight:   76.6 kg   Height:        Exam: Gen alert, no distress  No jvd or bruits Chest clear bilat to bases RRR no MRG Abd soft ntnd no mass or ascites +bs Ext no LE edema Neuro is alert, Ox 3 , nf    LUA AVFB+bruit      Home meds include - norvasc 5, lipitor, hydralazine 25 tid, imdur 30 qd, prns    OP HD: TTS [facility] 4h  400/1.5  72.5kg  2/2 bath Heparin 2000   LUA AVF - last HD 12/9, post wt 72.7kg   - calcitriol 1.5 ug po tiw - mircera 30 ug q2, last 11/30, due 12/14     Assessment/ Plan: Diabetic foot infection - R foot w/ drainage/ odor, on IV abx. MRI w/o signs of osteo. Per pmd.  ESRD - on HD TTS. HD today.  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 Reassess 12/14.  MBD ckd - CCa in range, phos pending. Cont po vdra. Add on phos.  HTN/ vol - Bp's high, resume home BP meds. 4kg up, UFG same today.     Rob Jendayi Berling 10/09/2022, 1:13 PM   Recent Labs  Lab 10/07/22 0343 10/08/22 0307 10/09/22 0241  HGB 10.8* 10.4* 10.4*  ALBUMIN 3.7  --  3.0*  CALCIUM 9.6 9.0 9.1  PHOS 5.2*  --  6.2*  CREATININE 8.74* 5.65* 7.49*  K 4.7 3.9 4.2    Recent Labs  Lab 10/08/22 0307  IRON 59  TIBC 210*  FERRITIN 1,228*    Inpatient medications:  amLODipine  5 mg Oral Daily   aspirin  81 mg Oral Daily   atorvastatin  80 mg Oral QHS   Chlorhexidine Gluconate Cloth  6 each Topical Q0600   heparin injection (subcutaneous)  5,000 Units Subcutaneous Q8H   hydrALAZINE  25 mg Oral Q8H   insulin aspart  0-6 Units Subcutaneous Q4H   insulin detemir  10 Units Subcutaneous QHS   isosorbide mononitrate  30 mg Oral Daily    metroNIDAZOLE  500 mg Oral BID    cefTRIAXone (ROCEPHIN)  IV 2 g (10/09/22 0904)   vancomycin 750 mg (10/09/22 1253)   ondansetron (ZOFRAN) IV, polyethylene glycol

## 2022-10-09 NOTE — Progress Notes (Addendum)
Subjective:   Patient states that she is feeling well overall today.  She states that her pain is unchanged.  She states that she experienced some nausea and 1 episode of nonbloody emesis overnight.  Discussed plan to decrease her long-acting insulin today given her low blood glucose overnight. Patient reports dark stools that she is experienced over the last 3 years.  Discussed that this is likely a medication side effect of auryxia given that she has been taking this medication for the same period of time. Patient reports that she was not taking Plavix prior to her hospitalization and does not know why this was stopped. She does report taking carvedilol prior to hospitalization.  Objective:  Vital signs in last 24 hours: Vitals:   10/08/22 1623 10/08/22 1922 10/09/22 0343 10/09/22 0500  BP: (!) 150/73 136/75 129/61   Pulse: 81 85 80   Resp: 16 18 17    Temp: 98.3 F (36.8 C) 98.3 F (36.8 C) 97.9 F (36.6 C)   TempSrc: Oral Oral Oral   SpO2: 100% 100% 100%   Weight:    76.6 kg  Height:       Physical Exam: Constitutional: well-appearing, resting in bed, not in acute distress  Cardiovascular: regular rate and rhythm, no murmurs, rubs, or gallops, 2+ PT pulses bilaterally Pulmonary: normal work of breathing on room air, lungs clear to auscultation bilaterally, no wheezes, rales, or rhonchi Ext: Dressing on right foot without bleeding, drainage, or surrounding erythema  Neurological: awake and alert, non-focal  Assessment/Plan:  Principal Problem:   Diabetic foot infection (HCC) Active Problems:   Essential hypertension   Diabetic infection of right foot ([place])   Uncontrolled type 2 diabetes mellitus with hyperglycemia (HCC)   ESRD on dialysis ([place])   Anemia in chronic kidney disease   Chronic HFrEF (heart failure with reduced ejection fraction) (HCC)   [name] (coronary artery disease) of artery bypass graft   HFrEF (heart failure with reduced ejection fraction) ([place])  Melissa Howe is a 71 y.o.  female with a past medical history of T1DM and ESRD on HD (TTS) who presented with right foot wound and was admitted for further evaluation and management of diabetic foot infection.    #Right diabetic foot infection  #Type II DM with long term insulin use  No signs of osteomyelitis on MRI. Wound cultures grew gram positive cocci, gram positive rods, group B strep. Day 3 of antibiotics. Pain today is unchanged. Podiatry not planning on surgical intervention but recommends ABI/PVR testing w/ vascular consult if abnormal findings. ABIs will be performed today. Patient will f/u with podiatry in clinic on 12/22.  -F/u podiatry recommendations  -Continue ceftriaxone, vancomycin, and metronidazole; narrow pending culture data -Pending ABIs  -PT/OT eval ordered   #ESRD on hemodialysis Plan for HD today.  -F/u nephrology recommendations -Avoid any nephrotoxic drugs -Trend renal function panel    #Anemia of chronic disease Hgb stable at 10.4. No signs of acute bleeding from wound. -Trend CBC   #[name] #Heart failure with reduced ejection fraction #Hypertension #HLD Echo from 06/2022 demonstrates EF 35-40%. Patient takes hydralazine 25 mg q8 hours, Imdur 30 mg daily, aspirin 81 mg daily, and atorvastatin 80 mg daily at home. Patient reports not taking Plavix at home. Carvedilol was discontinued by her nephrologist as an outpatient recently.  Reasoning for this remains unclear.  Patient is hypertensive today but will undergo dialysis.  Will consider restarting carvedilol after assessing her blood pressure following dialysis. -Continue hydralazine 25 mg  q 8 hours -Continue Imdur 30 mg  -Continue atorvastatin 80 mg  -Continue home aspirin 81 mg  #T1DM A1c 9.9 in 09/2022. Patient takes levemir 18 units daily at home. Patient required 23 units of insulin yesterday however glucose dropped to the 50s-60s overnight.  Will decrease Levemir to 10 units daily given lows. -SSI set to very  sensitive -Levemir 10 units daily -Ensure glucose remains <180 to have good wound healing  -Diabetes educator following    Diet: Carb-Modified IVF: None VTE: Heparin Code: Partial  Prior to Admission Living Arrangement: home w/ daughters Anticipated Discharge Location: home w/ daughters Barriers to Discharge: continued management Dispo: Anticipated discharge in approximately less than 2 day(s).   Starlyn Skeans, MD 10/09/2022, 5:45 AM Pager: [phone] After 5pm on weekdays and 1pm on weekends: On Call pager [phone]

## 2022-10-09 NOTE — Progress Notes (Signed)
Orthopedic Tech Progress Note Patient Details:  Melissa Howe 06-Jul-1951 [id number]  Ortho Devices Type of Ortho Device: Postop shoe/boot Ortho Device/Splint Interventions: Ordered      Danton Sewer A Maricia Scotti 10/09/2022, 6:05 PM

## 2022-10-09 NOTE — Progress Notes (Signed)
PODIATRY PROGRESS NOTE Patient Name: Melissa Howe  DOB 1951/08/06 DOA 10/06/2022  Hospital Day: 3  Assessment:  71 y.o. female with with uclerations to the plantar 2nd toe and hallux of the right foot most concerning for gangrenous changes related to PAD with superimposed infection now improved s/p bedside drainage of blister at the wound site and IV abx.  AF, VSS  WBC:  ESR/CRP:  Wound/Bone Cultures: bedside wound culture GPCs, rare GPR, few Group B strep  Imaging: MRI Right foot WO contrast: No evidence OM or abscess  Plan:  - No surgical plans - Recommend daily dressing change with adaptic, betadine gauze kerlix and ace wrap. This will continue until follow up.  - WBAT to RLE in post op shoe - Recommend completion of ABI/PVR testing - if abnormal recommend vascular consult for possible angio  - Continue IV abx pending further wound culture data, then ok with switch to PO abx x 7-10 days. Likely recommend augmentin 875-125 BID x 10 days - Pt has follow up with our office scheudled next week Dec 22, rec she keep that apt and follow up then for ongoing wound care/mgmt - Pt will be stable for discharge likely tomorrow from podiatry standpoint pending normal ABI/PVR study and wound culture resulted.        Everitt Amber, DPM [facility]    Subjective:  Pt seen bedside no complaints or concerns, says she is feeling better tha  yesterday. Denies pain to Right foot. Aware of plans for wound care and continued abx.   Objective:   Vitals:   10/08/22 0428 10/08/22 0816  BP: (!) 152/72 (!) 128/55  Pulse: 70 80  Resp: 17 16  Temp: 98.2 F (36.8 C) 98.1 F (36.7 C)  SpO2: 100% 98%       Latest Ref Rng & Units 10/08/2022    3:07 AM 10/07/2022    3:43 AM 05/31/2022    6:08 AM  CBC  WBC 4.0 - 10.5 K/uL 6.5  10.2  9.3   Hemoglobin 12.0 - 15.0 g/dL 10.4  10.8  10.6   Hematocrit 36.0 - 46.0 % 31.3  33.6  32.4   Platelets 150 - 400 K/uL 274  277  253         Latest Ref Rng & Units 10/08/2022    3:07 AM 10/07/2022    3:43 AM 05/31/2022    6:08 AM  BMP  Glucose 70 - 99 mg/dL 163  331  275   BUN 8 - 23 mg/dL 40  83  57   Creatinine 0.44 - 1.00 mg/dL 5.65  8.74  6.42   Sodium 135 - 145 mmol/L 135  135  133   Potassium 3.5 - 5.1 mmol/L 3.9  4.7  4.3   Chloride 98 - 111 mmol/L 95  96  92   CO2 22 - 32 mmol/L _0 Calcium 8.9 - 10.3 mg/dL 9.0  9.6  9.6     General: AAOx3, NAD  Lower Extremity Exam Vasc: R - PT non palpable, DP non palpable.   L - PT non palpable, DP non palpable.   Derm: R - Full-thickness ulcer of second toe with exposed subcutaneous tissue, superficial ulceration and blistering of plantar first MPJ . Dry necrotic wound base no purulence.     MSK:  R - Hammertoe deformities RLE  L -  No gross deformities. Compartments soft, non-tender, compressible  Neuro: R -  Gross sensation absent. Gross motor function intact   L - Gross sensation absent. Gross motor function intact   Radiology:  Results reviewed. See assessment for pertinent imaging results

## 2022-10-09 NOTE — Progress Notes (Signed)
ABI has been completed.   Preliminary results in CV Proc.   Melissa Howe 10/09/2022 4:14 PM

## 2022-10-09 NOTE — Progress Notes (Signed)
Contacted team this morning to inquire if pt stable for d/c today. Cultures are still pending and staff unsure if pt will d/c today. Inpt HD unit advised that pt will require inpt HD today due to the above.   Melven Sartorius Renal Navigator [phone]

## 2022-10-09 NOTE — Progress Notes (Signed)
OT Cancellation Note  Patient Details Name: Melissa Howe MRN: [id number] DOB: 02/18/1951   Cancelled Treatment:    Reason Eval/Treat Not Completed: Other (comment) (no post op shoe in room, per podiatry notes pt is to be WBAT to RLE in post op shoe; OT will reattempt when shoe has been delivered.)  Shanon Payor, OTD OTR/L  10/09/22, 2:46 PM

## 2022-10-10 DIAGNOSIS — E1165 Type 2 diabetes mellitus with hyperglycemia: Secondary | ICD-10-CM

## 2022-10-10 DIAGNOSIS — I5022 Chronic systolic (congestive) heart failure: Secondary | ICD-10-CM

## 2022-10-10 LAB — CBC
HCT: 30.4 % — ABNORMAL LOW (ref 36.0–46.0)
Hemoglobin: 10.4 g/dL — ABNORMAL LOW (ref 12.0–15.0)
MCH: 29.7 pg (ref 26.0–34.0)
MCHC: 34.2 g/dL (ref 30.0–36.0)
MCV: 86.9 fL (ref 80.0–100.0)
Platelets: 256 10*3/uL (ref 150–400)
RBC: 3.5 MIL/uL — ABNORMAL LOW (ref 3.87–5.11)
RDW: 13.9 % (ref 11.5–15.5)
WBC: 7.2 10*3/uL (ref 4.0–10.5)
nRBC: 0 % (ref 0.0–0.2)

## 2022-10-10 LAB — GLUCOSE, CAPILLARY
Glucose-Capillary: 131 mg/dL — ABNORMAL HIGH (ref 70–99)
Glucose-Capillary: 233 mg/dL — ABNORMAL HIGH (ref 70–99)
Glucose-Capillary: 253 mg/dL — ABNORMAL HIGH (ref 70–99)

## 2022-10-10 MED ORDER — AMOXICILLIN-POT CLAVULANATE 500-125 MG PO TABS
1.0000 | ORAL_TABLET | Freq: Two times a day (BID) | ORAL | Status: DC
  Administered 2022-10-10: 1 via ORAL
  Filled 2022-10-10 (×2): qty 1

## 2022-10-10 MED ORDER — AMOXICILLIN-POT CLAVULANATE 500-125 MG PO TABS: 1.0000 | ORAL_TABLET | Freq: Two times a day (BID) | ORAL | 0 refills | Status: AC

## 2022-10-10 MED ORDER — DOXYCYCLINE HYCLATE 100 MG PO TABS
100.0000 mg | ORAL_TABLET | Freq: Two times a day (BID) | ORAL | Status: DC
  Administered 2022-10-10: 100 mg via ORAL
  Filled 2022-10-10: qty 1

## 2022-10-10 MED ORDER — INSULIN DETEMIR 100 UNIT/ML FLEXPEN: 10.0000 [IU] | PEN_INJECTOR | Freq: Every day | SUBCUTANEOUS | 1 refills | Status: DC

## 2022-10-10 NOTE — Discharge Summary (Addendum)
Name: Melissa Howe MRN: [id number] DOB: [birth year] 71 y.o. PCP: Sonia Side., FNP  Date of Admission: 10/06/2022  8:46 PM Date of Discharge: 10/10/2022 Attending Physician: Charise Killian, MD  Discharge Diagnosis: 1. Principal Problem:   Diabetic foot infection ([place]) Active Problems:   Essential hypertension   Diabetic infection of right foot ([place])   Uncontrolled type 2 diabetes mellitus with hyperglycemia ([place])   ESRD on dialysis ([place])   Anemia in chronic kidney disease   Chronic HFrEF (heart failure with reduced ejection fraction) (H[facility])   [name] (coronary artery disease) of artery bypass graft   HFrEF (heart failure with reduced ejection fraction) ([place])  Discharge Medications: Allergies as of 10/10/2022   No Known Allergies      Medication List     STOP taking these medications    clopidogrel 75 MG tablet Commonly known as: PLAVIX       TAKE these medications    acetaminophen 500 MG tablet Commonly known as: TYLENOL Take 1,000 mg by mouth every 6 (six) hours as needed for moderate pain or headache.   albuterol 108 (90 Base) MCG/ACT inhaler Commonly known as: VENTOLIN HFA SMARTSIG:1-2 Puff(s) By Mouth Every 6 Hours PRN   amLODipine 5 MG tablet Commonly known as: NORVASC Take 5 mg by mouth daily.   amoxicillin-clavulanate 500-125 MG tablet Commonly known as: AUGMENTIN Take 1 tablet by mouth every 12 (twelve) hours for 19 doses.   aspirin 81 MG chewable tablet Chew 1 tablet (81 mg total) by mouth daily.   atorvastatin 80 MG tablet Commonly known as: LIPITOR Take 1 tablet (80 mg total) by mouth daily. What changed: when to take this   Auryxia 1 GM 210 MG(Fe) tablet Generic drug: ferric citrate Take 630 mg by mouth See admin instructions. Take 3tablets (630 mg) by mouth each meal and each snack   calcitRIOL 0.5 MCG capsule Commonly known as: ROCALTROL Take 5 capsules (2.5 mcg total) by mouth Every Tuesday,Thursday,and Saturday with dialysis.    CLEAR EYES COMPLETE OP Place 1 drop into both eyes daily as needed (dry eyes).   hydrALAZINE 25 MG tablet Commonly known as: APRESOLINE Take 1 tablet (25 mg total) by mouth every 8 (eight) hours.   insulin detemir 100 UNIT/ML FlexPen Commonly known as: LEVEMIR Inject 10 Units into the skin daily with breakfast. What changed: how much to take   Insulin Pen Needle 32G X 4 MM Misc Use with insulin pen to dispense insulin as directed   isosorbide mononitrate 30 MG 24 hr tablet Commonly known as: IMDUR Take 1 tablet (30 mg total) by mouth daily.   lubiprostone 24 MCG capsule Commonly known as: AMITIZA Take 24 mcg by mouth 2 (two) times daily as needed for constipation.   metoCLOPramide 5 MG tablet Commonly known as: REGLAN Take 5 mg by mouth 3 (three) times daily before meals.   multivitamin Tabs tablet Take 1 tablet by mouth daily.        Disposition and follow-up:   Melissa Howe was discharged from [facility] in Good condition.  At the hospital follow up visit please address:  1.  A. Diabetic right foot infection  -Discharged on Augmentin  -Will follow-up with podiatry and vascular surgery   B. Type 2 Diabetes  -Decreased long-acting daily insulin dose  2.  Labs / imaging needed at time of follow-up: CBC, BMP  3.  Pending labs/ test needing follow-up: none  Follow-up Appointments: - Primary care provider Dr.  Smith within the next 1-2 weeks (patient will call to schedule appointment)  - Dr. Boneta Lucks (podiatry) at [place] at [facility] on 10/17/2022 at 8:30 AM   - [facility] (patient will be called to schedule appointment)   Hospital Course by problem list: Melissa Howe is a 71 y.o.  female with a past medical history of T1DM and ESRD on HD (TTS) who presented with right foot wound and was admitted for further evaluation and management of diabetic foot infection.    #Right diabetic foot  infection  #Type II DM with long term insulin use  MRI negative for osteomyelitis. Gram positive cocci, gram positive rods, Staph simulans and group B strep on wound cultures. Patient treated with ceftriaxone, vancomycin, and metronidazole prior to susceptibilities. Received 4 days of antibiotics total.  Susceptibilities returned pansensitive.  Will discharge on Augmentin.  Patient has outpatient follow-up with podiatry scheduled. ABIs demonstrate abnormal left toe-brachial index and non-compressible right and left lower extremity arteries.  Vascular surgery was consulted and will see the patient as an outpatient.  Plan for angiogram next week.   #[name] #Heart failure with reduced ejection fraction #Hypertension #HLD EF 35-40% on echo from 06/2022. On hydralazine 25 mg q8 hours, Imdur 30 mg daily, aspirin 81 mg daily, and atorvastatin 80 mg daily at home.  Continued hydralazine, Imdur, atorvastatin, and aspirin during her hospitalization. Carvedilol was discontinued by her nephrologist recently.  Blood pressure remained within normal limits without carvedilol.  Will defer restarting carvedilol to outpatient nephrology.  Discharged on hydralazine, Imdur, atorvastatin, and aspirin.  #T1DM A1c 9.9 in 09/2022. On levemir 18 units daily at home. Reported episodes of low blood sugar at home.  Decrease Levemir to 10 units daily following episodes of hypoglycemia.  Patient was seen by diabetic educator. Will discharge on 10 units of Levemir daily.  #ESRD on hemodialysis Patient tolerated HD well while hospitalized.   #Anemia of chronic disease Hgb remained stable throughout her hospitalization. No signs of acute bleeding throughout her hospitalization.    Discharge Exam:   BP (!) 143/66 (BP Location: Right Arm)   Pulse 84   Temp (!) 97.5 F (36.4 C) (Oral)   Resp 15   Ht 5\' 4"  (1.626 m)   Wt 75 kg Comment: bed scale  SpO2 100%   BMI 28.38 kg/m  Constitutional: well-appearing, resting in bed, not  in acute distress  Cardiovascular: regular rate and rhythm, no murmurs, rubs, or gallops Pulmonary: normal work of breathing on room air, lungs clear to auscultation bilaterally, no wheezes, rales, or rhonchi Ext: Dressing on right foot without bleeding, drainage, or surrounding erythema  Neurological: awake and alert, non-focal  Pertinent Labs, Studies, and Procedures:     Latest Ref Rng & Units 10/10/2022    4:53 AM 10/09/2022    2:41 AM 10/08/2022    3:07 AM  CBC  WBC 4.0 - 10.5 K/uL 7.2  7.4  6.5   Hemoglobin 12.0 - 15.0 g/dL 10.4  10.4  10.4   Hematocrit 36.0 - 46.0 % 30.4  31.0  31.3   Platelets 150 - 400 K/uL 256  272  274        Latest Ref Rng & Units 10/09/2022    2:41 AM 10/08/2022    3:07 AM 10/07/2022    3:43 AM  BMP  Glucose 70 - 99 mg/dL 56  163  331   BUN 8 - 23 mg/dL 63  40  83  Creatinine 0.44 - 1.00 mg/dL 7.49  5.65  8.74   Sodium 135 - 145 mmol/L 135  135  135   Potassium 3.5 - 5.1 mmol/L 4.2  3.9  4.7   Chloride 98 - 111 mmol/L 97  95  96   CO2 22 - 32 mmol/L 24  28  21    Calcium 8.9 - 10.3 mg/dL 9.1  9.0  9.6     DG Foot Complete Right  IMPRESSION: No erosive changes to suggest osteomyelitis.   Mild soft tissue swelling is noted.   On: 10/07/2022 04:00  MR FOOT RIGHT WO CONTRAST  IMPRESSION: 1. Subcutaneous soft tissue swelling/edema mainly involving the dorsum of the foot. No discrete fluid collection to suggest a drainable abscess. 2. Suspect a wound involving the plantar aspect of the second toe. No abscess is identified. 3. Limited examination but no definite MR findings for osteomyelitis or septic arthritis. 4. Moderate to advanced midfoot degenerative changes. 5. Abnormal T1 and T2 signal intensity in the base of the second metatarsal could be degenerative or stress related. No discrete stress fracture. 6. Mild diffuse myositis without findings suspicious for myofasciitis.   On: 10/07/2022 12:03   Discharge Instructions: Discharge  Instructions     Call MD for:  persistant nausea and vomiting   Complete by: As directed    Call MD for:  redness, tenderness, or signs of infection (pain, swelling, redness, odor or green/yellow discharge around incision site)   Complete by: As directed    Call MD for:  severe uncontrolled pain   Complete by: As directed    Call MD for:  temperature >100.4   Complete by: As directed    Diet - low sodium heart healthy   Complete by: As directed    Increase activity slowly   Complete by: As directed       You were hospitalized for an infection of your foot.    Hospital Course: We treated you with antibiotics for your infection. We will send you home on antibiotics. You will follow-up with podiatry for this concern.  We noticed decreased blood flow in the arteries of your legs while you are hospitalized.  You will see vascular surgery as an outpatient for this concern. Vascular surgery will perform an angiogram next week. We decreased the amount of long-acting insulin you take per day given episodes of low blood sugar.   Medications:   Please start taking: -Augmentin 500-125 mg tablet (every 12 hours for 10 days) for your foot infection   Please stop taking: -Plavix 75 mg tablet   Please change how you are taking: -Levemir (inject 10 units into the skin daily with breakfast)   Please continue taking: -Acetaminophen 500 mg tablet -Albuterol -Amlodipine 5 mg tablet -Aspirin 81 mg tablet -Atorvastatin 80 mg tablet -Auryxia 1 GM tablet -Calcitriol 0.5 mcg capsule -Clear eyes drops -Hydralazine 25 mg tablet -Insulin pen needles -Imdur 30 mg tablet -Lubiprostone 24 mcg capsule -Metoclopramide 5 mg tablet -Multivitamin   Follow-up: - Please follow up with your primary care provider Dr. Tamala Julian within the next 1-2 weeks (please call to schedule an appointment).    - Please follow up with Dr. Boneta Lucks (podiatry) at [place] and [place] at [facility] on 10/17/2022 at  8:30 AM  Phone: [phone] Address: [address], [place], [place] [address]   - Please follow up with vascular surgery at [facility] (you will be called to schedule this appointment) Phone: [phone] Address:  [address], [place], [place] [address]    Signed: Starlyn Skeans, MD 10/10/2022, 3:18 PM   Pager: [phone]

## 2022-10-10 NOTE — Progress Notes (Signed)
[facility] Progress Note  Subjective: seen in room, no c/o  Vitals:   10/10/22 0236 10/10/22 0302 10/10/22 0344 10/10/22 0749  BP: 118/65 136/72 133/60 (!) 143/66  Pulse: 76 81 86 84  Resp: 19 20 16 15   Temp:  97.8 F (36.6 C) 98.1 F (36.7 C) (!) 97.5 F (36.4 C)  TempSrc:  Oral Oral Oral  SpO2: 100% 100% 100% 100%  Weight:      Height:        Exam: Gen alert, no distress  No jvd or bruits Chest clear bilat to bases RRR no MRG Abd soft ntnd no mass or ascites +bs Ext no LE edema Neuro is alert, Ox 3 , nf    LUA AVFB+bruit      Home meds include - norvasc 5, lipitor, hydralazine 25 tid, imdur 30 qd, prns    OP HD: TTS [facility] 4h  400/1.5  72.5kg  2/2 bath Heparin 2000   LUA AVF - last HD 12/9, post wt 72.7kg   - calcitriol 1.5 ug po tiw - mircera 30 ug q2, last 11/30, due 12/14     Assessment/ Plan: Diabetic foot infection - R foot w/ drainage/ odor, on IV abx. MRI w/o signs of osteo. For dc home on po augmentin. Will f/u w/ podiatry and [facility]VS in OP setting.  ESRD - on HD TTS.  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 Reassess 12/14.  MBD ckd - CCa in range, phos pending. Cont po vdra. Add on phos.  HTN/ vol - BPs good, 2-3kg up DIspo - for dc today    Rob Shy Guallpa 10/10/2022, 4:11 PM   Recent Labs  Lab 10/07/22 0343 10/08/22 0307 10/09/22 0241 10/10/22 0453  HGB 10.8* 10.4* 10.4* 10.4*  ALBUMIN 3.7  --  3.0*  --   CALCIUM 9.6 9.0 9.1  --   PHOS 5.2*  --  6.2*  --   CREATININE 8.74* 5.65* 7.49*  --   K 4.7 3.9 4.2  --     Recent Labs  Lab 10/08/22 0307  IRON 59  TIBC 210*  FERRITIN 1,228*    Inpatient medications:  amLODipine  5 mg Oral Daily   amoxicillin-clavulanate  1 tablet Oral Q12H   aspirin  81 mg Oral Daily   atorvastatin  80 mg Oral QHS   Chlorhexidine Gluconate Cloth  6 each Topical Q0600   heparin injection (subcutaneous)  5,000 Units Subcutaneous Q8H   hydrALAZINE  25 mg Oral Q8H    insulin aspart  0-6 Units Subcutaneous Q4H   insulin detemir  10 Units Subcutaneous QHS   isosorbide mononitrate  30 mg Oral Daily     ondansetron (ZOFRAN) IV, polyethylene glycol

## 2022-10-10 NOTE — Plan of Care (Signed)

## 2022-10-10 NOTE — Progress Notes (Signed)
Mobility Specialist - Progress Note   10/10/22 1352  Mobility  Activity Ambulated with assistance in room  Level of Assistance Contact guard assist, steadying assist  Assistive Device Other (Comment) (HHA)  Distance Ambulated (ft) 30 ft  Activity Response Tolerated well  Mobility Referral Yes  $Mobility charge 1 Mobility   Pt was received in chair and agreeable to session. No complaints during session. Pt was returned to chair with all needs met.   Franki Monte  Mobility Specialist Please contact via Solicitor or Rehab office at [phone]

## 2022-10-10 NOTE — Progress Notes (Signed)
Inpatient Diabetes Program Recommendations  AACE/ADA: New Consensus Statement on Inpatient Glycemic Control (2015)  Target Ranges:  Prepandial:   less than 140 mg/dL      Peak postprandial:   less than 180 mg/dL (1-2 hours)      Critically ill patients:  140 - 180 mg/dL   Lab Results  Component Value Date   GLUCAP 253 (H) 10/10/2022   HGBA1C 9.9 (H) 10/08/2022    Review of Glycemic Control  Latest Reference Range & Units 10/09/22 11:37 10/09/22 16:09 10/09/22 19:23 10/10/22 03:45 10/10/22 07:44 10/10/22 11:32  Glucose-Capillary 70 - 99 mg/dL 192 (H) 192 (H) 226 (H) 244 (H) 131 (H) 233 (H) 253 (H)   Diabetes history: DM Outpatient Diabetes medications:  Levemir 18 units q HS  Current orders for Inpatient glycemic control:  Novolog 0-6 units q 4 hours Levemir 10 units q HS  Inpatient Diabetes Program Recommendations:   Consider reducing Novolog correction to tid with meals and HS.  If post prandial blood sugars increased, may need low dose meal coverage?    Thanks,  Adah Perl, RN, BC-ADM Inpatient Diabetes Coordinator Pager [phone]  (8a-5p)

## 2022-10-10 NOTE — Progress Notes (Signed)
Received patient in bed to unit.  Alert and oriented.  Informed consent signed and in chart.   Treatment initiated: 2306 Treatment completed: 0220  Patient tolerated well.  Transported back to the room  Alert, without acute distress.  Hand-off given to patient's nurse.   Access used: avf Access issues: none  Total UF removed: 3100 Medication(s) given: heparin 2000 units bolus, vancomycin 750mg /13ml Post HD VS: see table below Post HD weight: 75 kg bed scale   10/10/22 0236  Vitals  BP 118/65  MAP (mmHg) 79  BP Location Right Arm  BP Method Automatic  Patient Position (if appropriate) Lying  Pulse Rate 76  Pulse Rate Source Monitor  ECG Heart Rate 76  Resp 19  Oxygen Therapy  SpO2 100 %  O2 Device Room Air  Patient Activity (if Appropriate) In bed  Pulse Oximetry Type Continuous  During Treatment Monitoring  Intra-Hemodialysis Comments Tolerated well      Arel[facility]

## 2022-10-10 NOTE — Discharge Instructions (Addendum)
You were hospitalized for an infection of your foot.   Hospital Course: We treated you with antibiotics for your infection. We will send you home on antibiotics. You will follow-up with podiatry for this concern.  We noticed decreased blood flow in the arteries of your legs while you are hospitalized.  You will see vascular surgery as an outpatient for this concern. Vascular surgery will perform an angiogram next week. We decreased the amount of long-acting insulin you take per day given episodes of low blood sugar.  Medications:  Please start taking: -Augmentin 500-125 mg tablet (every 12 hours for 10 days) for your foot infection  Please stop taking: -Plavix 75 mg tablet  Please change how you are taking: -Levemir (inject 10 units into the skin daily with breakfast)  Please continue taking: -Acetaminophen 500 mg tablet -Albuterol -Amlodipine 5 mg tablet -Aspirin 81 mg tablet -Atorvastatin 80 mg tablet -Auryxia 1 GM tablet -Calcitriol 0.5 mcg capsule -Clear eyes drops -Hydralazine 25 mg tablet -Insulin pen needles -Imdur 30 mg tablet -Lubiprostone 24 mcg capsule -Metoclopramide 5 mg tablet -Multivitamin  Follow-up: - Please follow up with your primary care provider Dr. Tamala Julian within the next 1-2 weeks (please call to schedule an appointment).   - Please follow up with Dr. Boneta Lucks (podiatry) at [place] and [place] at [facility] on 10/17/2022 at 8:30 AM  Phone: [phone] Address: [address], [place], [place] [address]  - Please follow up with vascular surgery at [facility] (you will be called to schedule this appointment) Phone: [phone] Address: [address], [place], [place] [address]

## 2022-10-10 NOTE — Evaluation (Signed)
Physical Therapy Evaluation/ Discharge Patient Details Name: Melissa Howe MRN: [id number] DOB: 04/30/1951 Today's Date: 10/10/2022  History of Present Illness  71 yo female admitted 12/11 with Rt foot wound s/p bedside drainage 12/12. PMhx; T2DM, neuropathy, ESRD on HD, [name], HFrEF, HTN, HLD  Clinical Impression  Pt pleasant and lives at home with daughter, normally very independent. Pt reports she does wear shoes at home to protect feet but does not visually inspect with mirror or photo regularly and educated for benefit of this practice. Pt educated for donning post op shoe and wear as well as need for LLE shoe to accommodate height difference. Pt with minor guarding during gait due to imbalance with single shoe but anticipate with additional shoe she will be significantly improved. Pt verbalized understanding of education and no further acute therapy needs. Will sign off with pt aware and agreeable.        Recommendations for follow up therapy are one component of a multi-disciplinary discharge planning process, led by the attending physician.  Recommendations may be updated based on patient status, additional functional criteria and insurance authorization.  Follow Up Recommendations No PT follow up      Assistance Recommended at Discharge PRN  Patient can return home with the following       Equipment Recommendations None recommended by PT  Recommendations for Other Services       Functional Status Assessment Patient has not had a recent decline in their functional status     Precautions / Restrictions Precautions Precautions: Fall Required Braces or Orthoses: Other Brace Other Brace: post op shoe RLE      Mobility  Bed Mobility Overal bed mobility: Independent                  Transfers Overall transfer level: Independent                      Ambulation/Gait Ambulation/Gait assistance: Supervision Gait Distance (Feet): 350 Feet Assistive  device: 1 person hand held assist Gait Pattern/deviations: Step-through pattern, Decreased stride length   Gait velocity interpretation: 1.31 - 2.62 ft/sec, indicative of limited community ambulator   General Gait Details: pt with uneven gait due to post op shoe on right and no shoe on left with guarding of HHA due to uneven height with stepping. Pt educated for additional shoe to accommodate height at home with pt voicing understanding  Stairs Stairs: Yes Stairs assistance: Modified independent (Device/Increase time) Stair Management: One rail Left, Step to pattern, Forwards Number of Stairs: 11    Wheelchair Mobility    Modified Reece (Stroke Patients Only)       Balance Overall balance assessment: Mild deficits observed, not formally tested                                           Pertinent Vitals/Pain Pain Assessment Pain Assessment: No/denies pain    Home Living Family/patient expects to be discharged to:: Private residence Living Arrangements: Children Available Help at Discharge: Family;Available PRN/intermittently Type of Home: House Home Access: Level entry       Home Layout: Two level;Bed/bath upstairs Home Equipment: None      Prior Function Prior Level of Function : Independent/Modified Independent                     Hand Dominance  Extremity/Trunk Assessment   Upper Extremity Assessment Upper Extremity Assessment: Overall WFL for tasks assessed    Lower Extremity Assessment Lower Extremity Assessment: Overall WFL for tasks assessed (neuropathy baseline)    Cervical / Trunk Assessment Cervical / Trunk Assessment: Normal  Communication   Communication: No difficulties  Cognition Arousal/Alertness: Awake/alert Behavior During Therapy: WFL for tasks assessed/performed Overall Cognitive Status: Within Functional Limits for tasks assessed                                          General  Comments      Exercises     Assessment/Plan    PT Assessment Patient does not need any further PT services  PT Problem List         PT Treatment Interventions      PT Goals (Current goals can be found in the Care Plan section)  Acute Rehab PT Goals PT Goal Formulation: All assessment and education complete, DC therapy    Frequency       Co-evaluation               AM-PAC PT "6 Clicks" Mobility  Outcome Measure Help needed turning from your back to your side while in a flat bed without using bedrails?: None Help needed moving from lying on your back to sitting on the side of a flat bed without using bedrails?: None Help needed moving to and from a bed to a chair (including a wheelchair)?: None Help needed standing up from a chair using your arms (e.g., wheelchair or bedside chair)?: None Help needed to walk in hospital room?: A Little Help needed climbing 3-5 steps with a railing? : None 6 Click Score: 23    End of Session Equipment Utilized During Treatment: Other (comment) (post op shoe RLE) Activity Tolerance: Patient tolerated treatment well Patient left: in chair;with call bell/phone within reach Nurse Communication: Mobility status PT Visit Diagnosis: Other abnormalities of gait and mobility (R26.89)    Time: 2423-5361 PT Time Calculation (min) (ACUTE ONLY): 16 min   Charges:   PT Evaluation $PT Eval Low Complexity: 1 Low          [place], PT Acute Rehabilitation Services Office: [phone]   Lamarr Lulas 10/10/2022, 7:37 AM

## 2022-10-10 NOTE — Progress Notes (Signed)
OT Cancellation Note  Patient Details Name: Melissa Howe MRN: [id number] DOB: [birth year]   Cancelled Treatment:    Reason Eval/Treat Not Completed: OT screened, no needs identified, will sign off Per conversation with PT, OT to screen patient as patient as no acute OT needs. Please re-consult if further acute needs arise.  Corinne Ports E. Jerel Sardina, OTR/L Acute Rehabilitation Services [phone]   Ascencion Dike 10/10/2022, 7:50 AM

## 2022-10-10 NOTE — Care Management Important Message (Signed)
Important Message  Patient Details  Name: Chaz Ronning MRN: [id number] Date of Birth: 1951/05/20   Medicare Important Message Given:  Yes     Saifan Rayford 10/10/2022, 1:22 PM

## 2022-10-10 NOTE — Consult Note (Addendum)
Hospital Consult    Reason for Consult:  abnormal ABIs, foot wound Requesting Physician:  Dr. Starlyn Skeans MRN #:  [id number]  History of Present Illness: This is a 71 y.o. female with pertinent past medical history including ESRD on HD TTS, Type II DM, Anemia, HTN, and HLD who presented on 12/12 to the ER with diabetic right foot wound. She explains that she noticed wound and darkening on her right foot on Tuesday morning. She told her daughter about it and her daughter looked at wound and brought her to ER. She has decreased sensation in her toes secondary to diabetic neuropathy so she says she never felt the wound there. Does not recall any injury to her toes. She does report some bleeding and drainage from the wound. She denies fever or chills. She explains that she does not have any pain in her legs or feet at rest or on ambulation aside from her neuropathy. She says she has not had any wounds recently. Recalls having stepped on a needle and not knowing it in her left foot several years ago but this healed without issues. Since her admission she had MRI negative for osteomyelitis. Gram positive cocci, gram positive rods, group B strep on wound cultures by Podiatry. She is currently on day 4 of antibiotics- Ceftriaxone, Vanc, Metronidazole. Pending final susceptibilities. ABIs demonstrate abnormal left toe-brachial index and non-compressible right and left lower extremity arteries. She is former smoker.  She is familiar to our practice from her dialysis access placement in the past. We have now been consulted for evaluation of her right foot wound and concern for PAD.   Past Medical History:  Diagnosis Date   Anemia    Chronic kidney disease    Constipation    Diabetes mellitus    Type II   Dyspnea    with exertion   History of blood transfusion    Hyperlipemia    Hypertension    Pneumonia 2019    Past Surgical History:  Procedure Laterality Date   APPENDECTOMY     AV FISTULA  PLACEMENT Left 10/05/2019   Procedure: INSERTION OF ARTERIOVENOUS (AV) GORE-TEX VASCULAR GRAFT LEFT ARM;  Surgeon: [name], MD;  Location: [place];  Service: Vascular;  Laterality: Left;   AV FISTULA PLACEMENT Left 02/27/2021   Procedure: LEFT ARM FIRST STAGE BASILIC VEIN  ARTERIOVENOUS (AV) FISTULA CREATION;  Surgeon: Waynetta Sandy, MD;  Location: [place];  Service: Vascular;  Laterality: Left;   [place] Left 04/17/2021   Procedure: LEFT SECOND STAGE [place];  Surgeon: Waynetta Sandy, MD;  Location: [place];  Service: Vascular;  Laterality: Left;   BIOPSY  07/24/2020   Procedure: BIOPSY;  Surgeon: Doran Stabler, MD;  Location: [place];  Service: Gastroenterology;;   BUBBLE STUDY  01/02/2020   Procedure: BUBBLE STUDY;  Surgeon: Elouise Munroe, MD;  Location: [place];  Service: Cardiology;;   COLONOSCOPY WITH PROPOFOL N/A 07/24/2020   Procedure: COLONOSCOPY WITH PROPOFOL;  Surgeon: Doran Stabler, MD;  Location: [place];  Service: Gastroenterology;  Laterality: N/A;   CORONARY STENT INTERVENTION N/A 07/12/2021   Procedure: CORONARY STENT INTERVENTION;  Surgeon: Jettie Booze, MD;  Location: [place] [facility];  Service: Cardiovascular;  Laterality: N/A;   ESOPHAGOGASTRODUODENOSCOPY (EGD) WITH PROPOFOL N/A 07/24/2020   Procedure: ESOPHAGOGASTRODUODENOSCOPY (EGD) WITH PROPOFOL;  Surgeon: Doran Stabler, MD;  Location: [place];  Service: Gastroenterology;  Laterality: N/A;   EYE SURGERY  Bilateral    Cataract   FOREIGN BODY REMOVAL Left 05/13/2018   Procedure: FOREIGN BODY REMOVAL left foot;  Surgeon: Meredith Pel, MD;  Location: [place];  Service: Orthopedics;  Laterality: Left;   I & D EXTREMITY Left 05/21/2018   Procedure: LEFT FOOT DEBRIDEMENT AND WOUND CLOSURE;  Surgeon: Newt Minion, MD;  Location: [place];  Service: Orthopedics;  Laterality: Left;   INTRAVASCULAR ULTRASOUND/IVUS N/A 07/12/2021    Procedure: Intravascular Ultrasound/IVUS;  Surgeon: Jettie Booze, MD;  Location: [place] [facility];  Service: Cardiovascular;  Laterality: N/A;   LEFT HEART CATH AND CORONARY ANGIOGRAPHY N/A 07/12/2021   Procedure: LEFT HEART CATH AND CORONARY ANGIOGRAPHY;  Surgeon: Jettie Booze, MD;  Location: [place] [facility];  Service: Cardiovascular;  Laterality: N/A;   POLYPECTOMY  07/24/2020   Procedure: POLYPECTOMY;  Surgeon: Doran Stabler, MD;  Location: [place];  Service: Gastroenterology;;   TEE WITHOUT CARDIOVERSION N/A 01/02/2020   Procedure: TRANSESOPHAGEAL ECHOCARDIOGRAM (TEE);  Surgeon: Elouise Munroe, MD;  Location: [place];  Service: Cardiology;  Laterality: N/A;   TONSILLECTOMY     TUBAL LIGATION      No Known Allergies  Prior to Admission medications   Medication Sig Start Date End Date Taking? Authorizing Provider  amLODipine (NORVASC) 5 MG tablet Take 5 mg by mouth daily.   Yes [provider]  aspirin 81 MG chewable tablet Chew 1 tablet (81 mg total) by mouth daily. 07/13/21  Yes Ghimire, Henreitta Leber, MD  atorvastatin (LIPITOR) 80 MG tablet Take 1 tablet (80 mg total) by mouth daily. Patient taking differently: Take 80 mg by mouth at bedtime. 07/13/21  Yes Ghimire, Henreitta Leber, MD  AURYXIA 1 GM 210 MG(Fe) tablet Take 630 mg by mouth See admin instructions. Take 3tablets (630 mg) by mouth each meal and each snack 06/18/20  Yes [provider]  hydrALAZINE (APRESOLINE) 25 MG tablet Take 1 tablet (25 mg total) by mouth every 8 (eight) hours. 07/13/21  Yes Ghimire, Henreitta Leber, MD  Hyprom-Naphaz-Polysorb-Zn Sulf (CLEAR EYES COMPLETE OP) Place 1 drop into both eyes daily as needed (dry eyes).   Yes [provider]  Insulin Detemir (LEVEMIR) 100 UNIT/ML Pen Inject 18 Units into the skin daily with breakfast. Patient taking differently: Inject 18 Units into the skin at bedtime. 08/11/19  Yes Dahal, Marlowe Aschoff, MD  isosorbide mononitrate (IMDUR)  30 MG 24 hr tablet Take 1 tablet (30 mg total) by mouth daily. 07/13/21  Yes Ghimire, Henreitta Leber, MD  metoCLOPramide (REGLAN) 5 MG tablet Take 5 mg by mouth 3 (three) times daily before meals. 09/19/22  Yes [provider]  multivitamin (RENA-VIT) TABS tablet Take 1 tablet by mouth daily. 07/10/22  Yes [provider]  acetaminophen (TYLENOL) 500 MG tablet Take 1,000 mg by mouth every 6 (six) hours as needed for moderate pain or headache. Patient not taking: Reported on 10/07/2022    [provider]  albuterol (VENTOLIN HFA) 108 (90 Base) MCG/ACT inhaler SMARTSIG:1-2 Puff(s) By Mouth Every 6 Hours PRN Patient not taking: Reported on 10/07/2022 09/02/21   [provider]  calcitRIOL (ROCALTROL) 0.5 MCG capsule Take 5 capsules (2.5 mcg total) by mouth Every Tuesday,Thursday,and Saturday with dialysis. Patient not taking: Reported on 10/07/2022 07/24/20   Flora Lipps, MD  clopidogrel (PLAVIX) 75 MG tablet Take 1 tablet (75 mg total) by mouth daily with breakfast. Patient not taking: Reported on 10/07/2022 07/13/21   Jonetta Osgood, MD  Insulin Pen  Needle 32G X 4 MM MISC Use with insulin pen to dispense insulin as directed 09/08/16   Tat, Shanon Brow, MD  lubiprostone (AMITIZA) 24 MCG capsule Take 24 mcg by mouth 2 (two) times daily as needed for constipation. Patient not taking: Reported on 10/07/2022 01/22/21   [provider]    Social History   Socioeconomic History   Marital status: Divorced    Spouse name: Not on file   Number of children: Not on file   Years of education: Not on file   Highest education level: Not on file  Occupational History   Occupation: Retired  Tobacco Use   Smoking status: Former    Years: 4.00    Types: Cigarettes    Quit date: 01/19/1984    Years since quitting: 38.7   Smokeless tobacco: Never  Vaping Use   Vaping Use: Never used  Substance and Sexual Activity   Alcohol use: No   Drug use: No   Sexual activity:  Not on file  Other Topics Concern   Not on file  Social History Narrative   Lives in [place] with her dtr.  Previously worked in a [employer] but has been out on disability since 2009.  Recently moved from disability to "retired" when she turned 67.  Sedentary.  Knits frequently for fun.   Social Determinants of Health   Financial Resource Strain: Not on file  Food Insecurity: No Food Insecurity (10/07/2022)   Hunger Vital Sign    Worried About Running Out of Food in the Last Year: Never true    Ran Out of Food in the Last Year: Never true  Transportation Needs: No Transportation Needs (10/07/2022)   PRAPARE - Hydrologist (Medical): No    Lack of Transportation (Non-Medical): No  Physical Activity: Unknown (08/06/2019)   Exercise Vital Sign    Days of Exercise per Week: Patient refused    Minutes of Exercise per Session: Patient refused  Stress: No Stress Concern Present (08/06/2019)   [place]    Feeling of Stress : Only a little  Social Connections: Unknown (08/06/2019)   Social Connection and Isolation Panel [NHANES]    Frequency of Communication with Friends and Family: Patient refused    Frequency of Social Gatherings with Friends and Family: Patient refused    Attends Religious Services: Patient refused    Active Member of Clubs or Organizations: Patient refused    Attends Archivist Meetings: Patient refused    Marital Status: Patient refused  Intimate Partner Violence: Not At Risk (10/07/2022)   Humiliation, Afraid, Rape, and Kick questionnaire    Fear of Current or Ex-Partner: No    Emotionally Abused: No    Physically Abused: No    Sexually Abused: No     Family History  Problem Relation Age of Onset   Hypertension Mother    Diabetes Mother    Hypertension Sister     ROS: Otherwise negative unless mentioned in HPI  Physical Examination  Vitals:   10/10/22  0344 10/10/22 0749  BP: 133/60 (!) 143/66  Pulse: 86 84  Resp: 16 15  Temp: 98.1 F (36.7 C) (!) 97.5 F (36.4 C)  SpO2: 100% 100%   Body mass index is 28.38 kg/m.  General:  WDWN in NAD; sitting up in chair  Gait: Not observed HENT: WNL, normocephalic Pulmonary: normal non-labored breathing Cardiac: regular rate and rhythm Abdomen: soft, non distended Vascular Exam/Pulses:  2+ femoral pulses bilaterally, 2+ popliteal pulses, Palpable AT pulse at ankle on right, left DP and PT pulse palpable. Right foot dressings in place. Ulcerations on plantar aspect of 1st and 2nd toes with darkening of second toe Musculoskeletal: no muscle wasting or atrophy  Neurologic: A&O X 3;  No focal weakness or paresthesias are detected; speech is fluent/normal Psychiatric:  The pt has Normal affect.  CBC    Component Value Date/Time   WBC 7.2 10/10/2022 0453   RBC 3.50 (L) 10/10/2022 0453   HGB 10.4 (L) 10/10/2022 0453   HCT 30.4 (L) 10/10/2022 0453   PLT 256 10/10/2022 0453   MCV 86.9 10/10/2022 0453   MCH 29.7 10/10/2022 0453   MCHC 34.2 10/10/2022 0453   RDW 13.9 10/10/2022 0453   LYMPHSABS 3.3 10/07/2022 0343   MONOABS 0.8 10/07/2022 0343   EOSABS 0.4 10/07/2022 0343   BASOSABS 0.0 10/07/2022 0343    BMET    Component Value Date/Time   NA 135 10/09/2022 0241   K 4.2 10/09/2022 0241   CL 97 (L) 10/09/2022 0241   CO2 24 10/09/2022 0241   GLUCOSE 56 (L) 10/09/2022 0241   BUN 63 (H) 10/09/2022 0241   CREATININE 7.49 (H) 10/09/2022 0241   CALCIUM 9.1 10/09/2022 0241   GFRNONAA 5 (L) 10/09/2022 0241   GFRAA 8 (L) 07/24/2020 0510    COAGS: Lab Results  Component Value Date   INR 1.0 07/09/2021   INR 1.1 12/29/2019   INR 0.98 07/13/2015     Non-Invasive Vascular Imaging:   +-------+-----------+-----------+------------+------------+  ABI/TBIToday's ABIToday's TBIPrevious ABIPrevious TBI  +-------+-----------+-----------+------------+------------+  Right 1.51                                             +-------+-----------+-----------+------------+------------+  Left  1.51       0.46                                 +-------+-----------+-----------+------------+------------+  Left toe pressure 77 mmHg Right toe pressure not obtained due to wound  Statin:  Yes.   Beta Blocker:  No. Aspirin:  Yes.   ACEI:  No. ARB:  No. CCB use:  Yes Other antiplatelets/anticoagulants:  Yes.   Plavix   ASSESSMENT/PLAN: This is a 71 y.o. female with pertinent past medical history including ESRD on HD TTS, Type II DM, Anemia, HTN, and HLD who presented on 12/12 to the ER with diabetic right foot wound. She has ulceration of plantar aspect of 1st and 2nd right toes. Afebrile, no leukocytosis. Currently on broad spectrum Abx. MRI negative for osteomyelitis. Non invasive studies with non compressible arteries on the right. 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 On exam she does have pulses palpable but would benefit from Angiography. She has outpatient follow up with Podiatry arranged form next week on 12/12. Right foot wound dressing changes and management per Podiatry. I discussed the Angiogram with patient and discussed risks vs benefits and she is agreeable to proceed. She does not have to stay in hospital over weekend for this if she is otherwise okay for discharge she can return as outpatient. Will try to get her on schedule for earlier next week. The on call vascular surgeon, Dr. Stanford Breed will see her this afternoon and provide further details regarding timing  of intervention.    Karoline Caldwell PA-C [facility] [phone] 10/10/2022  9:03 AM  VASCULAR STAFF ADDENDUM: I have independently interviewed and examined the patient. I agree with the above.  Patient with diabetic foot infection. Palpable pulses on exam. 1/2 toe ulceration. Patient strongly prefers to leave and do this as an outpatient. I think this is safe to  do. Plan outpatient angiogram next week.  Yevonne Aline. Stanford Breed, MD [facility] Phone Number: [phone] 10/10/2022 1:59 PM

## 2022-10-11 LAB — AEROBIC CULTURE W GRAM STAIN (SUPERFICIAL SPECIMEN): Gram Stain: NONE SEEN

## 2022-10-12 ENCOUNTER — Telehealth: Payer: Self-pay | Admitting: Nephrology

## 2022-10-12 LAB — CULTURE, BLOOD (ROUTINE X 2)
Culture: NO GROWTH
Culture: NO GROWTH
Special Requests: ADEQUATE

## 2022-10-12 NOTE — Telephone Encounter (Signed)
Transition of care contact from inpatient facility  Date of Discharge: 10/10/22 Date of Contact:attempted 10/12/22  Method of contact: Phone  Attempted to contact patient to discuss transition of care from inpatient admission. Patient did not answer the phone. Message was left on the patient's voicemail with call back number [phone] and or will foloww up at [place]  next week at dialysis center.

## 2022-10-13 ENCOUNTER — Other Ambulatory Visit: Payer: Self-pay

## 2022-10-13 DIAGNOSIS — L089 Local infection of the skin and subcutaneous tissue, unspecified: Secondary | ICD-10-CM

## 2022-10-13 DIAGNOSIS — I739 Peripheral vascular disease, unspecified: Secondary | ICD-10-CM

## 2022-10-13 MED ORDER — SODIUM CHLORIDE 0.9% FLUSH: 3.0000 mL | Freq: Two times a day (BID) | INTRAVENOUS | Status: DC

## 2022-10-13 MED ORDER — SODIUM CHLORIDE 0.9% FLUSH: 3.0000 mL | INTRAVENOUS | Status: DC | PRN

## 2022-10-13 MED ORDER — SODIUM CHLORIDE 0.9 % IV SOLN: 250.0000 mL | INTRAVENOUS | Status: DC | PRN

## 2022-10-14 ENCOUNTER — Telehealth: Payer: Self-pay | Admitting: *Deleted

## 2022-10-14 NOTE — Telephone Encounter (Signed)
Call from [facility] - stated had been on 30 units of Levemir at home and received rx for 10 units - calling to verify. Informed pt was discharged from the hospital on 10/10/22 and per Dr Mapp's discharge summary:    "On levemir 18 units daily at home. Reported episodes of low blood sugar at home.  Decrease Levemir to 10 units daily following episodes of hypoglycemia.  Patient was seen by diabetic educator. Will discharge on 10 units of Levemir daily. "

## 2022-10-15 ENCOUNTER — Ambulatory Visit (HOSPITAL_COMMUNITY)
Admission: RE | Admit: 2022-10-15 | Discharge: 2022-10-15 | Disposition: A | Discharge status: Home / Self Care | Payer: Medicare Other | Attending: Vascular Surgery | Admitting: Vascular Surgery

## 2022-10-15 ENCOUNTER — Other Ambulatory Visit: Payer: Self-pay

## 2022-10-15 ENCOUNTER — Ambulatory Visit (HOSPITAL_COMMUNITY)
Admission: RE | Disposition: A | Discharge status: Home / Self Care | Payer: Self-pay | Source: Home / Self Care | Attending: Vascular Surgery

## 2022-10-15 DIAGNOSIS — Z833 Family history of diabetes mellitus: Secondary | ICD-10-CM | POA: Diagnosis not present

## 2022-10-15 DIAGNOSIS — N186 End stage renal disease: Secondary | ICD-10-CM | POA: Insufficient documentation

## 2022-10-15 DIAGNOSIS — E785 Hyperlipidemia, unspecified: Secondary | ICD-10-CM | POA: Insufficient documentation

## 2022-10-15 DIAGNOSIS — Z794 Long term (current) use of insulin: Secondary | ICD-10-CM | POA: Diagnosis not present

## 2022-10-15 DIAGNOSIS — Z87891 Personal history of nicotine dependence: Secondary | ICD-10-CM | POA: Diagnosis not present

## 2022-10-15 DIAGNOSIS — I70221 Atherosclerosis of native arteries of extremities with rest pain, right leg: Secondary | ICD-10-CM | POA: Insufficient documentation

## 2022-10-15 DIAGNOSIS — Z992 Dependence on renal dialysis: Secondary | ICD-10-CM | POA: Diagnosis not present

## 2022-10-15 DIAGNOSIS — I70235 Atherosclerosis of native arteries of right leg with ulceration of other part of foot: Secondary | ICD-10-CM | POA: Diagnosis not present

## 2022-10-15 DIAGNOSIS — E11621 Type 2 diabetes mellitus with foot ulcer: Secondary | ICD-10-CM | POA: Diagnosis not present

## 2022-10-15 DIAGNOSIS — I739 Peripheral vascular disease, unspecified: Secondary | ICD-10-CM

## 2022-10-15 DIAGNOSIS — I120 Hypertensive chronic kidney disease with stage 5 chronic kidney disease or end stage renal disease: Secondary | ICD-10-CM | POA: Diagnosis not present

## 2022-10-15 DIAGNOSIS — L089 Local infection of the skin and subcutaneous tissue, unspecified: Secondary | ICD-10-CM

## 2022-10-15 DIAGNOSIS — E1122 Type 2 diabetes mellitus with diabetic chronic kidney disease: Secondary | ICD-10-CM | POA: Insufficient documentation

## 2022-10-15 DIAGNOSIS — Z8249 Family history of ischemic heart disease and other diseases of the circulatory system: Secondary | ICD-10-CM | POA: Insufficient documentation

## 2022-10-15 DIAGNOSIS — Z95828 Presence of other vascular implants and grafts: Secondary | ICD-10-CM | POA: Insufficient documentation

## 2022-10-15 DIAGNOSIS — E1151 Type 2 diabetes mellitus with diabetic peripheral angiopathy without gangrene: Secondary | ICD-10-CM | POA: Insufficient documentation

## 2022-10-15 DIAGNOSIS — L97519 Non-pressure chronic ulcer of other part of right foot with unspecified severity: Secondary | ICD-10-CM | POA: Insufficient documentation

## 2022-10-15 HISTORY — PX: ABDOMINAL AORTOGRAM W/LOWER EXTREMITY: CATH118223

## 2022-10-15 LAB — POCT I-STAT, CHEM 8
BUN: 44 mg/dL — ABNORMAL HIGH (ref 8–23)
Calcium, Ion: 1.13 mmol/L — ABNORMAL LOW (ref 1.15–1.40)
Chloride: 97 mmol/L — ABNORMAL LOW (ref 98–111)
Creatinine, Ser: 7.4 mg/dL — ABNORMAL HIGH (ref 0.44–1.00)
Glucose, Bld: 106 mg/dL — ABNORMAL HIGH (ref 70–99)
HCT: 36 % (ref 36.0–46.0)
Hemoglobin: 12.2 g/dL (ref 12.0–15.0)
Potassium: 5.1 mmol/L (ref 3.5–5.1)
Sodium: 139 mmol/L (ref 135–145)
TCO2: 32 mmol/L (ref 22–32)

## 2022-10-15 LAB — GLUCOSE, CAPILLARY: Glucose-Capillary: 101 mg/dL — ABNORMAL HIGH (ref 70–99)

## 2022-10-15 SURGERY — ABDOMINAL AORTOGRAM W/LOWER EXTREMITY
Anesthesia: LOCAL

## 2022-10-15 MED ORDER — ACETAMINOPHEN 325 MG PO TABS: 650.0000 mg | ORAL_TABLET | ORAL | Status: DC | PRN

## 2022-10-15 MED ORDER — ONDANSETRON HCL 4 MG/2ML IJ SOLN: 4.0000 mg | Freq: Four times a day (QID) | INTRAMUSCULAR | Status: DC | PRN

## 2022-10-15 MED ORDER — FENTANYL CITRATE (PF) 100 MCG/2ML IJ SOLN
INTRAMUSCULAR | Status: DC | PRN
  Administered 2022-10-15: 50 ug via INTRAVENOUS

## 2022-10-15 MED ORDER — MIDAZOLAM HCL 2 MG/2ML IJ SOLN
INTRAMUSCULAR | Status: DC | PRN
  Administered 2022-10-15: 1 mg via INTRAVENOUS

## 2022-10-15 MED ORDER — HEPARIN (PORCINE) IN NACL 1000-0.9 UT/500ML-% IV SOLN
INTRAVENOUS | Status: DC | PRN
  Administered 2022-10-15 (×2): 500 mL

## 2022-10-15 MED ORDER — HYDRALAZINE HCL 20 MG/ML IJ SOLN: 5.0000 mg | INTRAMUSCULAR | Status: DC | PRN

## 2022-10-15 MED ORDER — HEPARIN SODIUM (PORCINE) 1000 UNIT/ML IJ SOLN
INTRAMUSCULAR | Status: AC
  Filled 2022-10-15: qty 10

## 2022-10-15 MED ORDER — SODIUM CHLORIDE 0.9% FLUSH: 3.0000 mL | Freq: Two times a day (BID) | INTRAVENOUS | Status: DC

## 2022-10-15 MED ORDER — HEPARIN SODIUM (PORCINE) 1000 UNIT/ML IJ SOLN: INTRAMUSCULAR | Status: DC | PRN

## 2022-10-15 MED ORDER — SODIUM CHLORIDE 0.9% FLUSH: 3.0000 mL | INTRAVENOUS | Status: DC | PRN

## 2022-10-15 MED ORDER — SODIUM CHLORIDE 0.9 % IV SOLN: 250.0000 mL | INTRAVENOUS | Status: DC | PRN

## 2022-10-15 MED ORDER — IODIXANOL 320 MG/ML IV SOLN
INTRAVENOUS | Status: DC | PRN
  Administered 2022-10-15: 75 mL

## 2022-10-15 MED ORDER — MIDAZOLAM HCL 2 MG/2ML IJ SOLN
INTRAMUSCULAR | Status: AC
  Filled 2022-10-15: qty 2

## 2022-10-15 MED ORDER — LABETALOL HCL 5 MG/ML IV SOLN: 10.0000 mg | INTRAVENOUS | Status: DC | PRN

## 2022-10-15 MED ORDER — FENTANYL CITRATE (PF) 100 MCG/2ML IJ SOLN
INTRAMUSCULAR | Status: AC
  Filled 2022-10-15: qty 2

## 2022-10-15 MED ORDER — ASPIRIN 81 MG PO TBEC: 81.0000 mg | DELAYED_RELEASE_TABLET | Freq: Every day | ORAL | Status: DC

## 2022-10-15 MED ORDER — HEPARIN (PORCINE) IN NACL 1000-0.9 UT/500ML-% IV SOLN
INTRAVENOUS | Status: AC
  Filled 2022-10-15: qty 1000

## 2022-10-15 MED ORDER — LIDOCAINE HCL (PF) 1 % IJ SOLN
INTRAMUSCULAR | Status: DC | PRN
  Administered 2022-10-15: 10 mL

## 2022-10-15 MED ORDER — SODIUM CHLORIDE 0.9 % WEIGHT BASED INFUSION: 1.0000 mL/kg/h | INTRAVENOUS | Status: DC

## 2022-10-15 MED ORDER — LIDOCAINE HCL (PF) 1 % IJ SOLN
INTRAMUSCULAR | Status: AC
  Filled 2022-10-15: qty 30

## 2022-10-15 SURGICAL SUPPLY — 12 items
CATH OMNI FLUSH 5F 65CM (CATHETERS) IMPLANT
DEVICE CLOSURE MYNXGRIP 5F (Vascular Products) IMPLANT
GLIDEWIRE ADV .035X260CM (WIRE) IMPLANT
KIT MICROPUNCTURE NIT STIFF (SHEATH) IMPLANT
KIT PV (KITS) ×1 IMPLANT
SHEATH CATAPULT 6FR 60 (SHEATH) IMPLANT
SHEATH PINNACLE 5F 10CM (SHEATH) IMPLANT
SHEATH PROBE COVER 6X72 (BAG) IMPLANT
SYR MEDRAD MARK V 150ML (SYRINGE) IMPLANT
TRANSDUCER W/STOPCOCK (MISCELLANEOUS) ×1 IMPLANT
TRAY PV CATH (CUSTOM PROCEDURE TRAY) ×1 IMPLANT
WIRE BENTSON .035X145CM (WIRE) IMPLANT

## 2022-10-15 NOTE — Op Note (Signed)
    Patient name: Melissa Howe MRN: [id number] DOB: [birth year] Sex: female  10/15/2022 Pre-operative Diagnosis: Right lower extremity critical limb ischemia with tissue loss at the toes Post-operative diagnosis:  Same Surgeon:  Broadus John, MD Procedure Performed: 1.  Ultrasound-guided micropuncture access of the left common femoral artery 2.  Aortogram 3.  Second-order cannulation, right lower extremity angiogram 4.  Moderate sedation time 5.  75 mL contrast 6.  Moderate sedation time 24 minutes 7.  Device assisted closure, Mynx   Indications:   This is a 71 y.o. female with pertinent past medical history including ESRD on HD TTS, Type II DM, Anemia, HTN, and HLD who presented on 12/12 to the ER with diabetic right foot wound. She has ulceration of plantar aspect of 1st and 2nd right toes. Afebrile, no leukocytosis. Currently on broad spectrum Abx. MRI negative for osteomyelitis. Non invasive studies with non compressible arteries on the right. 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 On exam she does have pulses palpable but would benefit from Angiography. She has outpatient follow up with Podiatry arranged form next week on 12/12. Right foot wound dressing changes and management per Podiatry. I discussed the Angiogram with patient and discussed risks vs benefits and she is agreeable to proceed.  Over the last week, and Melissa Howe stated that her foot wounds appear to be healing per her daughter.  Findings:  Aortogram: No flow-limiting stenosis in the aortoiliac segments bilaterally Right leg: No flow-limiting stenosis appreciated to the level of the knee.  Single-vessel, anterior tibial artery runoff to the foot with the dorsalis pedis continuing to the level of the toes.  The pedal arch is not intact but retrograde fills to the pedal arteries.    Procedure:  The patient was identified in the holding area and taken to room 8.  The patient was then placed supine on  the table and prepped and draped in the usual sterile fashion.  A time out was called.  Ultrasound was used to evaluate the left common femoral artery.  It was patent .  A digital ultrasound image was acquired.  A micropuncture needle was used to access the left common femoral artery under ultrasound guidance.  An 018 wire was advanced without resistance and a micropuncture sheath was placed.  The 018 wire was removed and a benson wire was placed.  The micropuncture sheath was exchanged for a 5 french sheath.  An omniflush catheter was advanced over the wire to the level of L-1.  An abdominal angiogram was obtained.  Next, using the omniflush catheter and a benson wire, the aortic bifurcation was crossed and the catheter was placed into theright external iliac artery and right runoff was obtained.  No complications with closure via minx device.  Impression: Single-vessel anterior tibial artery runoff to the foot with the dorsalis pedis continuing to the toes.  No intervention performed.  I had a long discussion with her and her daughter, should the wounds worsen, we could attempt high risk intervention to recanalize the posterior tibial artery.  At this time, she should have sufficient perfusion to heal her wounds.    Cassandria Santee, MD [facility] [place] Office: [phone]

## 2022-10-15 NOTE — H&P (Signed)
Hospital Consult  Patient seen and examined in preop holding.  No complaints. No changes to medication history or physical exam since last seen in clinic. After discussing the risks and benefits of right leg angiogram for critical limb ischemia with tissue loss to define and possibly improve distal perfusion for wound healing, Melissa Howe elected to proceed.   Broadus John MD   Reason for Consult:  abnormal ABIs, foot wound Requesting Physician:  Dr. Starlyn Skeans MRN #:  [id number]  History of Present Illness: This is a 71 y.o. female with pertinent past medical history including ESRD on HD TTS, Type II DM, Anemia, HTN, and HLD who presented on 12/12 to the ER with diabetic right foot wound. She explains that she noticed wound and darkening on her right foot on Tuesday morning. She told her daughter about it and her daughter looked at wound and brought her to ER. She has decreased sensation in her toes secondary to diabetic neuropathy so she says she never felt the wound there. Does not recall any injury to her toes. She does report some bleeding and drainage from the wound. She denies fever or chills. She explains that she does not have any pain in her legs or feet at rest or on ambulation aside from her neuropathy. She says she has not had any wounds recently. Recalls having stepped on a needle and not knowing it in her left foot several years ago but this healed without issues. Since her admission she had MRI negative for osteomyelitis. Gram positive cocci, gram positive rods, group B strep on wound cultures by Podiatry. She is currently on day 4 of antibiotics- Ceftriaxone, Vanc, Metronidazole. Pending final susceptibilities. ABIs demonstrate abnormal left toe-brachial index and non-compressible right and left lower extremity arteries. She is former smoker.  She is familiar to our practice from her dialysis access placement in the past. We have now been consulted for evaluation of her right  foot wound and concern for PAD.   Past Medical History:  Diagnosis Date   Anemia    Chronic kidney disease    Constipation    Diabetes mellitus    Type II   Dyspnea    with exertion   History of blood transfusion    Hyperlipemia    Hypertension    Pneumonia 2019    Past Surgical History:  Procedure Laterality Date   APPENDECTOMY     AV FISTULA PLACEMENT Left 10/05/2019   Procedure: INSERTION OF ARTERIOVENOUS (AV) GORE-TEX VASCULAR GRAFT LEFT ARM;  Surgeon: [name], MD;  Location: [place];  Service: Vascular;  Laterality: Left;   AV FISTULA PLACEMENT Left 02/27/2021   Procedure: LEFT ARM FIRST STAGE BASILIC VEIN  ARTERIOVENOUS (AV) FISTULA CREATION;  Surgeon: Waynetta Sandy, MD;  Location: [place];  Service: Vascular;  Laterality: Left;   [place] Left 04/17/2021   Procedure: LEFT SECOND STAGE B and E;  Surgeon: Waynetta Sandy, MD;  Location: [place];  Service: Vascular;  Laterality: Left;   BIOPSY  07/24/2020   Procedure: BIOPSY;  Surgeon: Doran Stabler, MD;  Location: [place];  Service: Gastroenterology;;   BUBBLE STUDY  01/02/2020   Procedure: BUBBLE STUDY;  Surgeon: Elouise Munroe, MD;  Location: [place];  Service: Cardiology;;   COLONOSCOPY WITH PROPOFOL N/A 07/24/2020   Procedure: COLONOSCOPY WITH PROPOFOL;  Surgeon: Doran Stabler, MD;  Location: [place];  Service: Gastroenterology;  Laterality: N/A;   CORONARY STENT INTERVENTION  N/A 07/12/2021   Procedure: CORONARY STENT INTERVENTION;  Surgeon: Jettie Booze, MD;  Location: [place];  Service: Cardiovascular;  Laterality: N/A;   ESOPHAGOGASTRODUODENOSCOPY (EGD) WITH PROPOFOL N/A 07/24/2020   Procedure: ESOPHAGOGASTRODUODENOSCOPY (EGD) WITH PROPOFOL;  Surgeon: Doran Stabler, MD;  Location: [place];  Service: Gastroenterology;  Laterality: N/A;   EYE SURGERY Bilateral    Cataract   FOREIGN BODY REMOVAL Left 05/13/2018    Procedure: FOREIGN BODY REMOVAL left foot;  Surgeon: Meredith Pel, MD;  Location: [place];  Service: Orthopedics;  Laterality: Left;   I & D EXTREMITY Left 05/21/2018   Procedure: LEFT FOOT DEBRIDEMENT AND WOUND CLOSURE;  Surgeon: Newt Minion, MD;  Location: [place];  Service: Orthopedics;  Laterality: Left;   INTRAVASCULAR ULTRASOUND/IVUS N/A 07/12/2021   Procedure: Intravascular Ultrasound/IVUS;  Surgeon: Jettie Booze, MD;  Location: [place] [facility];  Service: Cardiovascular;  Laterality: N/A;   LEFT HEART CATH AND CORONARY ANGIOGRAPHY N/A 07/12/2021   Procedure: LEFT HEART CATH AND CORONARY ANGIOGRAPHY;  Surgeon: Jettie Booze, MD;  Location: [place] [facility];  Service: Cardiovascular;  Laterality: N/A;   POLYPECTOMY  07/24/2020   Procedure: POLYPECTOMY;  Surgeon: Doran Stabler, MD;  Location: [place];  Service: Gastroenterology;;   TEE WITHOUT CARDIOVERSION N/A 01/02/2020   Procedure: TRANSESOPHAGEAL ECHOCARDIOGRAM (TEE);  Surgeon: Elouise Munroe, MD;  Location: [place];  Service: Cardiology;  Laterality: N/A;   TONSILLECTOMY     TUBAL LIGATION      No Known Allergies  Prior to Admission medications   Medication Sig Start Date End Date Taking? Authorizing Provider  amLODipine (NORVASC) 5 MG tablet Take 5 mg by mouth daily.   Yes [provider]  aspirin 81 MG chewable tablet Chew 1 tablet (81 mg total) by mouth daily. 07/13/21  Yes Ghimire, Henreitta Leber, MD  atorvastatin (LIPITOR) 80 MG tablet Take 1 tablet (80 mg total) by mouth daily. Patient taking differently: Take 80 mg by mouth at bedtime. 07/13/21  Yes Ghimire, Henreitta Leber, MD  AURYXIA 1 GM 210 MG(Fe) tablet Take 630 mg by mouth See admin instructions. Take 3tablets (630 mg) by mouth each meal and each snack 06/18/20  Yes [provider]  hydrALAZINE (APRESOLINE) 25 MG tablet Take 1 tablet (25 mg total) by mouth every 8 (eight) hours. 07/13/21  Yes Ghimire, Henreitta Leber, MD   Hyprom-Naphaz-Polysorb-Zn Sulf (CLEAR EYES COMPLETE OP) Place 1 drop into both eyes daily as needed (dry eyes).   Yes [provider]  Insulin Detemir (LEVEMIR) 100 UNIT/ML Pen Inject 18 Units into the skin daily with breakfast. Patient taking differently: Inject 18 Units into the skin at bedtime. 08/11/19  Yes Dahal, Marlowe Aschoff, MD  isosorbide mononitrate (IMDUR) 30 MG 24 hr tablet Take 1 tablet (30 mg total) by mouth daily. 07/13/21  Yes Ghimire, Henreitta Leber, MD  metoCLOPramide (REGLAN) 5 MG tablet Take 5 mg by mouth 3 (three) times daily before meals. 09/19/22  Yes [provider]  multivitamin (RENA-VIT) TABS tablet Take 1 tablet by mouth daily. 07/10/22  Yes [provider]  acetaminophen (TYLENOL) 500 MG tablet Take 1,000 mg by mouth every 6 (six) hours as needed for moderate pain or headache. Patient not taking: Reported on 10/07/2022    [provider]  albuterol (VENTOLIN HFA) 108 (90 Base) MCG/ACT inhaler SMARTSIG:1-2 Puff(s) By Mouth Every 6 Hours PRN Patient not taking: Reported on 10/07/2022 09/02/21   [provider]  calcitRIOL (  ROCALTROL) 0.5 MCG capsule Take 5 capsules (2.5 mcg total) by mouth Every Tuesday,Thursday,and Saturday with dialysis. Patient not taking: Reported on 10/07/2022 07/24/20   Flora Lipps, MD  clopidogrel (PLAVIX) 75 MG tablet Take 1 tablet (75 mg total) by mouth daily with breakfast. Patient not taking: Reported on 10/07/2022 07/13/21   Jonetta Osgood, MD  Insulin Pen Needle 32G X 4 MM MISC Use with insulin pen to dispense insulin as directed 09/08/16   Tat, Shanon Brow, MD  lubiprostone (AMITIZA) 24 MCG capsule Take 24 mcg by mouth 2 (two) times daily as needed for constipation. Patient not taking: Reported on 10/07/2022 01/22/21   [provider]    Social History   Socioeconomic History   Marital status: Divorced    Spouse name: Not on file   Number of children: Not on file   Years of education: Not on  file   Highest education level: Not on file  Occupational History   Occupation: Retired  Tobacco Use   Smoking status: Former    Years: 4.00    Types: Cigarettes    Quit date: 01/19/1984    Years since quitting: 38.7   Smokeless tobacco: Never  Vaping Use   Vaping Use: Never used  Substance and Sexual Activity   Alcohol use: No   Drug use: No   Sexual activity: Not on file  Other Topics Concern   Not on file  Social History Narrative   Lives in [place] with her dtr.  Previously worked in a [employer] but has been out on disability since 2009.  Recently moved from disability to "retired" when she turned 47.  Sedentary.  Knits frequently for fun.   Social Determinants of Health   Financial Resource Strain: Not on file  Food Insecurity: No Food Insecurity (10/07/2022)   Hunger Vital Sign    Worried About Running Out of Food in the Last Year: Never true    Ran Out of Food in the Last Year: Never true  Transportation Needs: No Transportation Needs (10/07/2022)   PRAPARE - Hydrologist (Medical): No    Lack of Transportation (Non-Medical): No  Physical Activity: Unknown (08/06/2019)   Exercise Vital Sign    Days of Exercise per Week: Patient refused    Minutes of Exercise per Session: Patient refused  Stress: No Stress Concern Present (08/06/2019)   [place]    Feeling of Stress : Only a little  Social Connections: Unknown (08/06/2019)   Social Connection and Isolation Panel [NHANES]    Frequency of Communication with Friends and Family: Patient refused    Frequency of Social Gatherings with Friends and Family: Patient refused    Attends Religious Services: Patient refused    Active Member of Clubs or Organizations: Patient refused    Attends Archivist Meetings: Patient refused    Marital Status: Patient refused  Intimate Partner Violence: Not At Risk (10/07/2022)    Humiliation, Afraid, Rape, and Kick questionnaire    Fear of Current or Ex-Partner: No    Emotionally Abused: No    Physically Abused: No    Sexually Abused: No     Family History  Problem Relation Age of Onset   Hypertension Mother    Diabetes Mother    Hypertension Sister     ROS: Otherwise negative unless mentioned in HPI  Physical Examination  Vitals:   10/15/22 1230 10/15/22 1541  BP: (!) 166/70  Pulse: 78   Resp: 16   Temp: (!) 97.2 F (36.2 C)   SpO2: 100% 100%   Body mass index is 29.01 kg/m.  General:  WDWN in NAD; sitting up in chair  Gait: Not observed HENT: WNL, normocephalic Pulmonary: normal non-labored breathing Cardiac: regular rate and rhythm Abdomen: soft, non distended Vascular Exam/Pulses: 2+ femoral pulses bilaterally, 2+ popliteal pulses, Palpable AT pulse at ankle on right, left DP and PT pulse palpable. Right foot dressings in place. Ulcerations on plantar aspect of 1st and 2nd toes with darkening of second toe Musculoskeletal: no muscle wasting or atrophy  Neurologic: A&O X 3;  No focal weakness or paresthesias are detected; speech is fluent/normal Psychiatric:  The pt has Normal affect.  CBC    Component Value Date/Time   WBC 7.2 10/10/2022 0453   RBC 3.50 (L) 10/10/2022 0453   HGB 12.2 10/15/2022 1309   HCT 36.0 10/15/2022 1309   PLT 256 10/10/2022 0453   MCV 86.9 10/10/2022 0453   MCH 29.7 10/10/2022 0453   MCHC 34.2 10/10/2022 0453   RDW 13.9 10/10/2022 0453   LYMPHSABS 3.3 10/07/2022 0343   MONOABS 0.8 10/07/2022 0343   EOSABS 0.4 10/07/2022 0343   BASOSABS 0.0 10/07/2022 0343    BMET    Component Value Date/Time   NA 139 10/15/2022 1309   K 5.1 10/15/2022 1309   CL 97 (L) 10/15/2022 1309   CO2 24 10/09/2022 0241   GLUCOSE 106 (H) 10/15/2022 1309   BUN 44 (H) 10/15/2022 1309   CREATININE 7.40 (H) 10/15/2022 1309   CALCIUM 9.1 10/09/2022 0241   GFRNONAA 5 (L) 10/09/2022 0241   GFRAA 8 (L) 07/24/2020 0510     COAGS: Lab Results  Component Value Date   INR 1.0 07/09/2021   INR 1.1 12/29/2019   INR 0.98 07/13/2015     Non-Invasive Vascular Imaging:   +-------+-----------+-----------+------------+------------+  ABI/TBIToday's ABIToday's TBIPrevious ABIPrevious TBI  +-------+-----------+-----------+------------+------------+  Right 1.51                                            +-------+-----------+-----------+------------+------------+  Left  1.51       0.46                                 +-------+-----------+-----------+------------+------------+  Left toe pressure 77 mmHg Right toe pressure not obtained due to wound  Statin:  Yes.   Beta Blocker:  No. Aspirin:  Yes.   ACEI:  No. ARB:  No. CCB use:  Yes Other antiplatelets/anticoagulants:  Yes.   Plavix   ASSESSMENT/PLAN: This is a 71 y.o. female with pertinent past medical history including ESRD on HD TTS, Type II DM, Anemia, HTN, and HLD who presented on 12/12 to the ER with diabetic right foot wound. She has ulceration of plantar aspect of 1st and 2nd right toes. Afebrile, no leukocytosis. Currently on broad spectrum Abx. MRI negative for osteomyelitis. Non invasive studies with non compressible arteries on the right. 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 On exam she does have pulses palpable but would benefit from Angiography. She has outpatient follow up with Podiatry arranged form next week on 12/12. Right foot wound dressing changes and management per Podiatry. I discussed the Angiogram with patient and discussed risks vs benefits and she  is agreeable to proceed. She does not have to stay in hospital over weekend for this if she is otherwise okay for discharge she can return as outpatient. Will try to get her on schedule for earlier next week. The on call vascular surgeon, Dr. Stanford Breed will see her this afternoon and provide further details regarding timing of intervention.    Broadus John PA-C [facility] [phone] 10/15/2022  5:03 PM  VASCULAR STAFF ADDENDUM: I have independently interviewed and examined the patient. I agree with the above.  Patient with diabetic foot infection. Palpable pulses on exam. 1/2 toe ulceration. Patient strongly prefers to leave and do this as an outpatient. I think this is safe to do. Plan outpatient angiogram next week.  Yevonne Aline. Stanford Breed, MD [facility] Phone Number: [phone] 10/15/2022 5:03 PM

## 2022-10-16 ENCOUNTER — Encounter (HOSPITAL_COMMUNITY): Payer: Self-pay | Admitting: Vascular Surgery

## 2022-10-16 MED FILL — Heparin Sodium (Porcine) Inj 1000 Unit/ML: INTRAMUSCULAR | Qty: 10 | Status: AC

## 2022-10-17 ENCOUNTER — Telehealth: Payer: Self-pay | Admitting: Vascular Surgery

## 2022-10-17 ENCOUNTER — Ambulatory Visit (INDEPENDENT_AMBULATORY_CARE_PROVIDER_SITE_OTHER): Payer: Medicare Other | Admitting: Podiatry

## 2022-10-17 DIAGNOSIS — I739 Peripheral vascular disease, unspecified: Secondary | ICD-10-CM

## 2022-10-17 DIAGNOSIS — E1165 Type 2 diabetes mellitus with hyperglycemia: Secondary | ICD-10-CM | POA: Diagnosis not present

## 2022-10-17 DIAGNOSIS — I96 Gangrene, not elsewhere classified: Secondary | ICD-10-CM | POA: Diagnosis not present

## 2022-10-17 NOTE — Telephone Encounter (Signed)
-----   Message from Melissa John, MD sent at 10/15/2022  5:20 PM EST ----- Follow up with [facility] for wound check in 1 month. No iamging. Can be with PA

## 2022-10-17 NOTE — Progress Notes (Signed)
Subjective:  Patient ID: Melissa Howe, female    DOB: 30-Dec-1950,  MRN: [id number]  Chief Complaint  Patient presents with   Diabetes    Right hallux toe     71 y.o. female presents with the above complaint.  Patient presents with right hallux and second digit gangrene.  Patient states that they just recently had a vascular procedure done which restored the blood flow via the AT.  Patient is here for follow-up.  He would like to discuss treatment options.  He does not want to lose the toe.  It is dry and stable.   Review of Systems: Negative except as noted in the HPI. Denies N/V/F/Ch.  Past Medical History:  Diagnosis Date   Anemia    Chronic kidney disease    Constipation    Diabetes mellitus    Type II   Dyspnea    with exertion   History of blood transfusion    Hyperlipemia    Hypertension    Pneumonia 2019    Current Outpatient Medications:    acetaminophen (TYLENOL) 500 MG tablet, Take 1,000 mg by mouth every 6 (six) hours as needed for moderate pain or headache. (Patient not taking: Reported on 10/07/2022), Disp: , Rfl:    albuterol (VENTOLIN HFA) 108 (90 Base) MCG/ACT inhaler, SMARTSIG:1-2 Puff(s) By Mouth Every 6 Hours PRN (Patient not taking: Reported on 10/07/2022), Disp: , Rfl:    amLODipine (NORVASC) 5 MG tablet, Take 5 mg by mouth daily., Disp: , Rfl:    aspirin 81 MG chewable tablet, Chew 1 tablet (81 mg total) by mouth daily., Disp: 30 tablet, Rfl: 1   atorvastatin (LIPITOR) 80 MG tablet, Take 1 tablet (80 mg total) by mouth daily. (Patient taking differently: Take 80 mg by mouth at bedtime.), Disp: 30 tablet, Rfl: 1   AURYXIA 1 GM 210 MG(Fe) tablet, Take 630 mg by mouth See admin instructions. Take 3tablets (630 mg) by mouth each meal and each snack, Disp: , Rfl:    calcitRIOL (ROCALTROL) 0.5 MCG capsule, Take 5 capsules (2.5 mcg total) by mouth Every Tuesday,Thursday,and Saturday with dialysis. (Patient not taking: Reported on 10/07/2022), Disp: 60 capsule,  Rfl: 2   hydrALAZINE (APRESOLINE) 25 MG tablet, Take 1 tablet (25 mg total) by mouth every 8 (eight) hours., Disp: 90 tablet, Rfl: 1   Hyprom-Naphaz-Polysorb-Zn Sulf (CLEAR EYES COMPLETE OP), Place 1 drop into both eyes daily as needed (dry eyes)., Disp: , Rfl:    insulin detemir (LEVEMIR) 100 UNIT/ML FlexPen, Inject 10 Units into the skin daily with breakfast., Disp: 15 mL, Rfl: 1   Insulin Pen Needle 32G X 4 MM MISC, Use with insulin pen to dispense insulin as directed, Disp: 100 each, Rfl: 1   isosorbide mononitrate (IMDUR) 30 MG 24 hr tablet, Take 1 tablet (30 mg total) by mouth daily., Disp: 30 tablet, Rfl: 1   lubiprostone (AMITIZA) 24 MCG capsule, Take 24 mcg by mouth 2 (two) times daily as needed for constipation. (Patient not taking: Reported on 10/07/2022), Disp: , Rfl:    metoCLOPramide (REGLAN) 5 MG tablet, Take 5 mg by mouth 3 (three) times daily before meals., Disp: , Rfl:    multivitamin (RENA-VIT) TABS tablet, Take 1 tablet by mouth daily., Disp: , Rfl:   Current Facility-Administered Medications:    0.9 %  sodium chloride infusion, 250 mL, Intravenous, PRN, Broadus John, MD   sodium chloride flush (NS) 0.9 % injection 3 mL, 3 mL, Intravenous, Q12H, Virl Cagey Carolann Littler, MD  sodium chloride flush (NS) 0.9 % injection 3 mL, 3 mL, Intravenous, PRN, Broadus John, MD  Social History   Tobacco Use  Smoking Status Former   Years: 4.00   Types: Cigarettes   Quit date: 01/19/1984   Years since quitting: 38.8  Smokeless Tobacco Never    No Known Allergies Objective:  There were no vitals filed for this visit. There is no height or weight on file to calculate BMI. Constitutional Well developed. Well nourished.  Vascular Dorsalis pedis pulses palpable bilaterally. Posterior tibial pulses palpable bilaterally. Capillary refill normal to all digits.  No cyanosis or clubbing noted. Pedal hair growth normal.  Neurologic Normal speech. Oriented to person, place, and  time. Epicritic sensation to light touch grossly present bilaterally.  Dermatologic Right hallux and second digit gangrene dry stable no signs of infection noted dark discoloration is improving per the patient.  Orthopedic: Normal joint ROM without pain or crepitus bilaterally. No visible deformities. No bony tenderness.   Radiographs: None Assessment:   1. Uncontrolled type 2 diabetes mellitus with hyperglycemia ([place])   2. Peripheral vascular disease ([place])   3. Gangrene of toe of right foot ([place])    Plan:  Patient was evaluated and treated and all questions answered.  Right second and hallux gangrene improving -All questions and concerns were discussed with the patient in extensive detail. -Given that there is an improvement in discoloration we will continue to clinically monitor it.  At his at this time it is currently dry and stable without any signs of purulent drainage. -Patient had a recent intervention with vascular with flow via the AT as a primary source of flow to the foot. -Regardless patient is still a high risk of digital amputation if there is no improvement  No follow-ups on file.

## 2022-11-07 ENCOUNTER — Encounter (HOSPITAL_COMMUNITY): Payer: Self-pay

## 2022-11-07 NOTE — Telephone Encounter (Signed)
Appt has been scheduled.

## 2022-11-12 ENCOUNTER — Encounter (HOSPITAL_COMMUNITY): Payer: Self-pay

## 2022-11-13 NOTE — Telephone Encounter (Signed)
Called patient to confirm dosing. Patient reports blood glucose in the 100s-200s at home without any hypoglycemic episodes. Patient was seen by her PCP following her hospitalization who continued her current insulin regimen.

## 2022-11-14 ENCOUNTER — Ambulatory Visit (INDEPENDENT_AMBULATORY_CARE_PROVIDER_SITE_OTHER): Payer: Medicare HMO | Admitting: Physician Assistant

## 2022-11-14 VITALS — BP 175/83 | HR 93 | Temp 98.1°F | Resp 20 | Ht 64.0 in | Wt 162.7 lb

## 2022-11-14 DIAGNOSIS — E11628 Type 2 diabetes mellitus with other skin complications: Secondary | ICD-10-CM | POA: Diagnosis not present

## 2022-11-14 DIAGNOSIS — L089 Local infection of the skin and subcutaneous tissue, unspecified: Secondary | ICD-10-CM

## 2022-11-14 DIAGNOSIS — I739 Peripheral vascular disease, unspecified: Secondary | ICD-10-CM

## 2022-11-14 NOTE — Progress Notes (Signed)
Office Note     CC:  follow up Requesting Provider:  Sonia Side., FNP  HPI: Melissa Howe is a 72 y.o. (March 27, 1951) female who presents for wound follow up of ulcerations on plantar aspect of 1st and 2nd toes with darkening of second toe. She is s/p Aortogram, Arteriogram of RLE on 10/15/22 by Dr. Virl Cagey. No intervention was indicated. The findings were as follows: Aortogram: No flow-limiting stenosis in the aortoiliac segments bilaterally. Right leg: No flow-limiting stenosis appreciated to the level of the knee.  Single-vessel, anterior tibial artery runoff to the foot with the dorsalis pedis continuing to the level of the toes. The pedal arch is not intact but retrograde fills to the pedal arteries. She was told that should her wounds worsen possible retrograde attempt could be made to recanalize the posterior tibial artery  She present today with her daughter . She reports no real change in her toes. She has been painting them with betadine after she washes and dries them. She denies any pain. She denies any drainage, fever or chills. She does not report any claudication, rest pain or tissue loss. She is using Darco shoe to ambulate   The pt is on a statin for cholesterol management.  The pt is on a daily aspirin.   Other AC:  none The pt is on CCB for hypertension.   The pt is diabetic.  Tobacco hx:  Former  Past Medical History:  Diagnosis Date   Anemia    Chronic kidney disease    Constipation    Diabetes mellitus    Type II   Dyspnea    with exertion   History of blood transfusion    Hyperlipemia    Hypertension    Pneumonia 2019    Past Surgical History:  Procedure Laterality Date   ABDOMINAL AORTOGRAM W/LOWER EXTREMITY N/A 10/15/2022   Procedure: ABDOMINAL AORTOGRAM W/LOWER EXTREMITY;  Surgeon: Broadus John, MD;  Location: [facility];  Service: Cardiovascular;  Laterality: N/A;   APPENDECTOMY     AV FISTULA PLACEMENT Left 10/05/2019   Procedure:  INSERTION OF ARTERIOVENOUS (AV) GORE-TEX VASCULAR GRAFT LEFT ARM;  Surgeon: [name], MD;  Location: [place];  Service: Vascular;  Laterality: Left;   AV FISTULA PLACEMENT Left 02/27/2021   Procedure: LEFT ARM FIRST STAGE BASILIC VEIN  ARTERIOVENOUS (AV) FISTULA CREATION;  Surgeon: Waynetta Sandy, MD;  Location: [facility];  Service: Vascular;  Laterality: Left;   [place] Left 04/17/2021   Procedure: LEFT SECOND STAGE [place];  Surgeon: Waynetta Sandy, MD;  Location: [facility];  Service: Vascular;  Laterality: Left;   BIOPSY  07/24/2020   Procedure: BIOPSY;  Surgeon: Doran Stabler, MD;  Location: [place];  Service: Gastroenterology;;   BUBBLE STUDY  01/02/2020   Procedure: BUBBLE STUDY;  Surgeon: Elouise Munroe, MD;  Location: [facility];  Service: Cardiology;;   COLONOSCOPY WITH PROPOFOL N/A 07/24/2020   Procedure: COLONOSCOPY WITH PROPOFOL;  Surgeon: Doran Stabler, MD;  Location: [facility];  Service: Gastroenterology;  Laterality: N/A;   CORONARY STENT INTERVENTION N/A 07/12/2021   Procedure: CORONARY STENT INTERVENTION;  Surgeon: Jettie Booze, MD;  Location: [facility];  Service: Cardiovascular;  Laterality: N/A;   ESOPHAGOGASTRODUODENOSCOPY (EGD) WITH PROPOFOL N/A 07/24/2020   Procedure: ESOPHAGOGASTRODUODENOSCOPY (EGD) WITH PROPOFOL;  Surgeon: Doran Stabler, MD;  Location: [facility];  Service: Gastroenterology;  Laterality: N/A;   EYE SURGERY  Bilateral    Cataract   FOREIGN BODY REMOVAL Left 05/13/2018   Procedure: FOREIGN BODY REMOVAL left foot;  Surgeon: Meredith Pel, MD;  Location: [place];  Service: Orthopedics;  Laterality: Left;   I & D EXTREMITY Left 05/21/2018   Procedure: LEFT FOOT DEBRIDEMENT AND WOUND CLOSURE;  Surgeon: Newt Minion, MD;  Location: [place] [facility];  Service: Orthopedics;  Laterality: Left;   INTRAVASCULAR ULTRASOUND/IVUS N/A 07/12/2021   Procedure: Intravascular  Ultrasound/IVUS;  Surgeon: Jettie Booze, MD;  Location: [facility];  Service: Cardiovascular;  Laterality: N/A;   LEFT HEART CATH AND CORONARY ANGIOGRAPHY N/A 07/12/2021   Procedure: LEFT HEART CATH AND CORONARY ANGIOGRAPHY;  Surgeon: Jettie Booze, MD;  Location: [facility];  Service: Cardiovascular;  Laterality: N/A;   POLYPECTOMY  07/24/2020   Procedure: POLYPECTOMY;  Surgeon: Doran Stabler, MD;  Location: [place];  Service: Gastroenterology;;   TEE WITHOUT CARDIOVERSION N/A 01/02/2020   Procedure: TRANSESOPHAGEAL ECHOCARDIOGRAM (TEE);  Surgeon: Elouise Munroe, MD;  Location: [place];  Service: Cardiology;  Laterality: N/A;   TONSILLECTOMY     TUBAL LIGATION      Social History   Socioeconomic History   Marital status: Divorced    Spouse name: Not on file   Number of children: Not on file   Years of education: Not on file   Highest education level: Not on file  Occupational History   Occupation: Retired  Tobacco Use   Smoking status: Former    Years: 4.00    Types: Cigarettes    Quit date: 01/19/1984    Years since quitting: 38.8   Smokeless tobacco: Never  Vaping Use   Vaping Use: Never used  Substance and Sexual Activity   Alcohol use: No   Drug use: No   Sexual activity: Not on file  Other Topics Concern   Not on file  Social History Narrative   Lives in [place] with her dtr.  Previously worked in a [employer] but has been out on disability since 2009.  Recently moved from disability to "retired" when she turned 36.  Sedentary.  Knits frequently for fun.   Social Determinants of Health   Financial Resource Strain: Not on file  Food Insecurity: No Food Insecurity (10/07/2022)   Hunger Vital Sign    Worried About Running Out of Food in the Last Year: Never true    Ran Out of Food in the Last Year: Never true  Transportation Needs: No Transportation Needs (10/07/2022)   PRAPARE - Hydrologist  (Medical): No    Lack of Transportation (Non-Medical): No  Physical Activity: Unknown (08/06/2019)   Exercise Vital Sign    Days of Exercise per Week: Patient refused    Minutes of Exercise per Session: Patient refused  Stress: No Stress Concern Present (08/06/2019)   [place]    Feeling of Stress : Only a little  Social Connections: Unknown (08/06/2019)   Social Connection and Isolation Panel [NHANES]    Frequency of Communication with Friends and Family: Patient refused    Frequency of Social Gatherings with Friends and Family: Patient refused    Attends Religious Services: Patient refused    Active Member of Clubs or Organizations: Patient refused    Attends Archivist Meetings: Patient refused    Marital Status: Patient refused  Intimate Partner Violence: Not At Risk (10/07/2022)   Humiliation, Afraid, Rape,  and Kick questionnaire    Fear of Current or Ex-Partner: No    Emotionally Abused: No    Physically Abused: No    Sexually Abused: No    Family History  Problem Relation Age of Onset   Hypertension Mother    Diabetes Mother    Hypertension Sister     Current Outpatient Medications  Medication Sig Dispense Refill   acetaminophen (TYLENOL) 500 MG tablet Take 1,000 mg by mouth every 6 (six) hours as needed for moderate pain or headache. (Patient not taking: Reported on 10/07/2022)     albuterol (VENTOLIN HFA) 108 (90 Base) MCG/ACT inhaler SMARTSIG:1-2 Puff(s) By Mouth Every 6 Hours PRN (Patient not taking: Reported on 10/07/2022)     amLODipine (NORVASC) 5 MG tablet Take 5 mg by mouth daily.     aspirin 81 MG chewable tablet Chew 1 tablet (81 mg total) by mouth daily. 30 tablet 1   atorvastatin (LIPITOR) 80 MG tablet Take 1 tablet (80 mg total) by mouth daily. (Patient taking differently: Take 80 mg by mouth at bedtime.) 30 tablet 1   AURYXIA 1 GM 210 MG(Fe) tablet Take 630 mg by mouth See admin  instructions. Take 3tablets (630 mg) by mouth each meal and each snack     calcitRIOL (ROCALTROL) 0.5 MCG capsule Take 5 capsules (2.5 mcg total) by mouth Every Tuesday,Thursday,and Saturday with dialysis. (Patient not taking: Reported on 10/07/2022) 60 capsule 2   hydrALAZINE (APRESOLINE) 25 MG tablet Take 1 tablet (25 mg total) by mouth every 8 (eight) hours. 90 tablet 1   Hyprom-Naphaz-Polysorb-Zn Sulf (CLEAR EYES COMPLETE OP) Place 1 drop into both eyes daily as needed (dry eyes).     insulin detemir (LEVEMIR) 100 UNIT/ML FlexPen Inject 10 Units into the skin daily with breakfast. 15 mL 1   Insulin Pen Needle 32G X 4 MM MISC Use with insulin pen to dispense insulin as directed 100 each 1   isosorbide mononitrate (IMDUR) 30 MG 24 hr tablet Take 1 tablet (30 mg total) by mouth daily. 30 tablet 1   lubiprostone (AMITIZA) 24 MCG capsule Take 24 mcg by mouth 2 (two) times daily as needed for constipation. (Patient not taking: Reported on 10/07/2022)     metoCLOPramide (REGLAN) 5 MG tablet Take 5 mg by mouth 3 (three) times daily before meals.     multivitamin (RENA-VIT) TABS tablet Take 1 tablet by mouth daily.     Current Facility-Administered Medications  Medication Dose Route Frequency Provider Last Rate Last Admin   0.9 %  sodium chloride infusion  250 mL Intravenous PRN Broadus John, MD       sodium chloride flush (NS) 0.9 % injection 3 mL  3 mL Intravenous Q12H Broadus John, MD       sodium chloride flush (NS) 0.9 % injection 3 mL  3 mL Intravenous PRN Broadus John, MD        No Known Allergies   REVIEW OF SYSTEMS:   [X]  denotes positive finding, [ ]  denotes negative finding Cardiac  Comments:  Chest pain or chest pressure:    Shortness of breath upon exertion:    Short of breath when lying flat:    Irregular heart rhythm:        Vascular    Pain in calf, thigh, or hip brought on by ambulation:    Pain in feet at night that wakes you up from your sleep:     Blood  clot in your veins:  Leg swelling:         Pulmonary    Oxygen at home:    Productive cough:     Wheezing:         Neurologic    Sudden weakness in arms or legs:     Sudden numbness in arms or legs:     Sudden onset of difficulty speaking or slurred speech:    Temporary loss of vision in one eye:     Problems with dizziness:         Gastrointestinal    Blood in stool:     Vomited blood:         Genitourinary    Burning when urinating:     Blood in urine:        Psychiatric    Major depression:         Hematologic    Bleeding problems:    Problems with blood clotting too easily:        Skin    Rashes or ulcers:        Constitutional    Fever or chills:      PHYSICAL EXAMINATION:  There were no vitals filed for this visit.  General:  WDWN in NAD; vital signs documented above Gait: Normal HENT: WNL, normocephalic Pulmonary: normal non-labored breathing  Cardiac: regular HR Vascular Exam/Pulses: 2+ femoral, Brisk Dp, Pero and PT signals in right foot. Right foot is warm. 1st and 2nd toe ulcerations with dry gangrene. No erythema, swelling or drainage.  Musculoskeletal: no muscle wasting or atrophy  Neurologic: A&O X 3;  No focal weakness or paresthesias are detected Psychiatric:  The pt has Normal affect.  ASSESSMENT/PLAN:: 72 y.o. female here for wound follow up of ulcerations on plantar aspect of 1st and 2nd toes with darkening of second toe. She is s/p Aortogram, Arteriogram of RLE on 10/15/22 by Dr. Virl Cagey. No intervention was indicated. The findings were as follows: Aortogram: No flow-limiting stenosis in the aortoiliac segments bilaterally. Right leg: No flow-limiting stenosis appreciated to the level of the knee.  Single-vessel, anterior tibial artery runoff to the foot with the dorsalis pedis continuing to the level of the toes. The pedal arch is not intact but retrograde fills to the pedal arteries. She was told that should her wounds worsen possible  retrograde attempt could be made to recanalize the posterior tibial artery. Presently her toes are unchanged. Will continue to allow them to demarcate. Presently no intervention is indicated. Recommend continued conservative management with diligent wound care. - Continue to wash with mild soap and water, pat dry. Paint with betadine - Keep toes protected - She will follow up in 4-6 weeks for wound recheck. She knows to call for earlier follow up with any new or concerning  symptoms   Karoline Caldwell, PA-C [facility] [phone]  Clinic MD:   Virl Cagey

## 2022-11-19 ENCOUNTER — Ambulatory Visit (INDEPENDENT_AMBULATORY_CARE_PROVIDER_SITE_OTHER): Payer: Medicare HMO | Admitting: Podiatry

## 2022-11-19 DIAGNOSIS — I739 Peripheral vascular disease, unspecified: Secondary | ICD-10-CM

## 2022-11-19 DIAGNOSIS — E1165 Type 2 diabetes mellitus with hyperglycemia: Secondary | ICD-10-CM | POA: Diagnosis not present

## 2022-11-19 DIAGNOSIS — I96 Gangrene, not elsewhere classified: Secondary | ICD-10-CM

## 2022-11-19 NOTE — Progress Notes (Signed)
Subjective:  Patient ID: Melissa Howe, female    DOB: September 25, 1951,  MRN: [id number]  Chief Complaint  Patient presents with   Foot Ulcer    72 y.o. female presents with the above complaint.  Patient with follow-up of right hallux and second digit gangrene.  The discoloration seems to be getting better.  They have been doing Betadine wet-to-dry dressing   Review of Systems: Negative except as noted in the HPI. Denies N/V/F/Ch.  Past Medical History:  Diagnosis Date   Anemia    Chronic kidney disease    Constipation    Diabetes mellitus    Type II   Dyspnea    with exertion   History of blood transfusion    Hyperlipemia    Hypertension    Pneumonia 2019    Current Outpatient Medications:    acetaminophen (TYLENOL) 500 MG tablet, Take 1,000 mg by mouth every 6 (six) hours as needed for moderate pain or headache., Disp: , Rfl:    albuterol (VENTOLIN HFA) 108 (90 Base) MCG/ACT inhaler, , Disp: , Rfl:    amLODipine (NORVASC) 5 MG tablet, Take 5 mg by mouth daily., Disp: , Rfl:    aspirin 81 MG chewable tablet, Chew 1 tablet (81 mg total) by mouth daily., Disp: 30 tablet, Rfl: 1   atorvastatin (LIPITOR) 80 MG tablet, Take 1 tablet (80 mg total) by mouth daily. (Patient taking differently: Take 80 mg by mouth at bedtime.), Disp: 30 tablet, Rfl: 1   AURYXIA 1 GM 210 MG(Fe) tablet, Take 630 mg by mouth See admin instructions. Take 3tablets (630 mg) by mouth each meal and each snack, Disp: , Rfl:    calcitRIOL (ROCALTROL) 0.5 MCG capsule, Take 5 capsules (2.5 mcg total) by mouth Every Tuesday,Thursday,and Saturday with dialysis., Disp: 60 capsule, Rfl: 2   hydrALAZINE (APRESOLINE) 25 MG tablet, Take 1 tablet (25 mg total) by mouth every 8 (eight) hours., Disp: 90 tablet, Rfl: 1   Hyprom-Naphaz-Polysorb-Zn Sulf (CLEAR EYES COMPLETE OP), Place 1 drop into both eyes daily as needed (dry eyes)., Disp: , Rfl:    insulin detemir (LEVEMIR) 100 UNIT/ML FlexPen, Inject 10 Units into the skin  daily with breakfast., Disp: 15 mL, Rfl: 1   Insulin Pen Needle 32G X 4 MM MISC, Use with insulin pen to dispense insulin as directed, Disp: 100 each, Rfl: 1   isosorbide mononitrate (IMDUR) 30 MG 24 hr tablet, Take 1 tablet (30 mg total) by mouth daily., Disp: 30 tablet, Rfl: 1   lubiprostone (AMITIZA) 24 MCG capsule, Take 24 mcg by mouth 2 (two) times daily as needed for constipation., Disp: , Rfl:    metoCLOPramide (REGLAN) 5 MG tablet, Take 5 mg by mouth 3 (three) times daily before meals., Disp: , Rfl:    multivitamin (RENA-VIT) TABS tablet, Take 1 tablet by mouth daily., Disp: , Rfl:   Current Facility-Administered Medications:    0.9 %  sodium chloride infusion, 250 mL, Intravenous, PRN, Broadus John, MD   sodium chloride flush (NS) 0.9 % injection 3 mL, 3 mL, Intravenous, Q12H, Broadus John, MD   sodium chloride flush (NS) 0.9 % injection 3 mL, 3 mL, Intravenous, PRN, Broadus John, MD  Social History   Tobacco Use  Smoking Status Former   Years: 4.00   Types: Cigarettes   Quit date: 01/19/1984   Years since quitting: 38.8   Passive exposure: Never  Smokeless Tobacco Never    No Known Allergies Objective:  There were no vitals  filed for this visit. There is no height or weight on file to calculate BMI. Constitutional Well developed. Well nourished.  Vascular Dorsalis pedis pulses palpable bilaterally. Posterior tibial pulses palpable bilaterally. Capillary refill normal to all digits.  No cyanosis or clubbing noted. Pedal hair growth normal.  Neurologic Normal speech. Oriented to person, place, and time. Epicritic sensation to light touch grossly present bilaterally.  Dermatologic Right hallux and second digit gangrene dry stable no signs of infection noted dark discoloration is improving per the patient.  It is improving  Orthopedic: Normal joint ROM without pain or crepitus bilaterally. No visible deformities. No bony tenderness.   Radiographs:  None Assessment:   No diagnosis found.  Plan:  Patient was evaluated and treated and all questions answered.  Right second and hallux gangrene improving -All questions and concerns were discussed with the patient in extensive detail. Clinically improved considerably.  Continue Betadine wet-to-dry dressing. -Unfortunately patient does not have good circulation to the foot with AT being the primary flow to the foot. -  No follow-ups on file.

## 2022-12-09 ENCOUNTER — Encounter (HOSPITAL_COMMUNITY): Payer: Self-pay

## 2022-12-16 ENCOUNTER — Ambulatory Visit: Payer: Medicare HMO | Admitting: Physician Assistant

## 2022-12-16 VITALS — BP 192/85 | HR 80 | Temp 98.6°F | Resp 20 | Ht 64.0 in | Wt 162.1 lb

## 2022-12-16 DIAGNOSIS — N186 End stage renal disease: Secondary | ICD-10-CM | POA: Diagnosis not present

## 2022-12-16 DIAGNOSIS — I739 Peripheral vascular disease, unspecified: Secondary | ICD-10-CM

## 2022-12-16 NOTE — Progress Notes (Signed)
Office Note     CC:  follow up Requesting Provider:  Sonia Side., FNP  HPI: Melissa Howe is a 72 y.o. (01-Jun-1951) female who presents for wound follow up of ulcers on 1st and 2nd toes. She is s/p Aortogram, Arteriogram of RLE on 10/15/22 by Dr. Virl Cagey. No intervention was indicated. Single-vessel, anterior tibial artery runoff to the foot with the dorsalis pedis continuing to the level of the toes. The pedal arch is not intact but retrograde fills to the pedal arteries.  Today she reports no real change in her toes. She has been painting them with betadine every other day after she washes and dries them. She denies any pain. She denies any drainage, fever or chills. She does not report any claudication, rest pain or tissue loss. She is using Darco shoe to ambulate    The pt is on a statin for cholesterol management.  The pt is on a daily aspirin.   Other AC:  none The pt is on CCB for hypertension.   The pt is diabetic.  Tobacco hx:  Former  Past Medical History:  Diagnosis Date   Anemia    Chronic kidney disease    Constipation    Diabetes mellitus    Type II   Dyspnea    with exertion   History of blood transfusion    Hyperlipemia    Hypertension    Pneumonia 2019    Past Surgical History:  Procedure Laterality Date   ABDOMINAL AORTOGRAM W/LOWER EXTREMITY N/A 10/15/2022   Procedure: ABDOMINAL AORTOGRAM W/LOWER EXTREMITY;  Surgeon: Broadus John, MD;  Location: [facility];  Service: Cardiovascular;  Laterality: N/A;   APPENDECTOMY     AV FISTULA PLACEMENT Left 10/05/2019   Procedure: INSERTION OF ARTERIOVENOUS (AV) GORE-TEX VASCULAR GRAFT LEFT ARM;  Surgeon: [name], MD;  Location: [place] [facility];  Service: Vascular;  Laterality: Left;   AV FISTULA PLACEMENT Left 02/27/2021   Procedure: LEFT ARM FIRST STAGE BASILIC VEIN  ARTERIOVENOUS (AV) FISTULA CREATION;  Surgeon: Waynetta Sandy, MD;  Location: [place];  Service: Vascular;  Laterality: Left;    [place] Left 04/17/2021   Procedure: LEFT SECOND STAGE [place];  Surgeon: Waynetta Sandy, MD;  Location: [place];  Service: Vascular;  Laterality: Left;   BIOPSY  07/24/2020   Procedure: BIOPSY;  Surgeon: Doran Stabler, MD;  Location: [place];  Service: Gastroenterology;;   BUBBLE STUDY  01/02/2020   Procedure: BUBBLE STUDY;  Surgeon: Elouise Munroe, MD;  Location: [facility];  Service: Cardiology;;   COLONOSCOPY WITH PROPOFOL N/A 07/24/2020   Procedure: COLONOSCOPY WITH PROPOFOL;  Surgeon: Doran Stabler, MD;  Location: [place];  Service: Gastroenterology;  Laterality: N/A;   CORONARY STENT INTERVENTION N/A 07/12/2021   Procedure: CORONARY STENT INTERVENTION;  Surgeon: Jettie Booze, MD;  Location: [place] [facility];  Service: Cardiovascular;  Laterality: N/A;   ESOPHAGOGASTRODUODENOSCOPY (EGD) WITH PROPOFOL N/A 07/24/2020   Procedure: ESOPHAGOGASTRODUODENOSCOPY (EGD) WITH PROPOFOL;  Surgeon: Doran Stabler, MD;  Location: [place];  Service: Gastroenterology;  Laterality: N/A;   EYE SURGERY Bilateral    Cataract   FOREIGN BODY REMOVAL Left 05/13/2018   Procedure: FOREIGN BODY REMOVAL left foot;  Surgeon: Meredith Pel, MD;  Location: [place];  Service: Orthopedics;  Laterality: Left;   I & D EXTREMITY Left 05/21/2018   Procedure: LEFT FOOT DEBRIDEMENT AND WOUND CLOSURE;  Surgeon: Meridee Score  V, MD;  Location: [place];  Service: Orthopedics;  Laterality: Left;   INTRAVASCULAR ULTRASOUND/IVUS N/A 07/12/2021   Procedure: Intravascular Ultrasound/IVUS;  Surgeon: Jettie Booze, MD;  Location: [place] [facility];  Service: Cardiovascular;  Laterality: N/A;   LEFT HEART CATH AND CORONARY ANGIOGRAPHY N/A 07/12/2021   Procedure: LEFT HEART CATH AND CORONARY ANGIOGRAPHY;  Surgeon: Jettie Booze, MD;  Location: [place];  Service: Cardiovascular;  Laterality: N/A;   POLYPECTOMY  07/24/2020    Procedure: POLYPECTOMY;  Surgeon: Doran Stabler, MD;  Location: [place];  Service: Gastroenterology;;   TEE WITHOUT CARDIOVERSION N/A 01/02/2020   Procedure: TRANSESOPHAGEAL ECHOCARDIOGRAM (TEE);  Surgeon: Elouise Munroe, MD;  Location: [place];  Service: Cardiology;  Laterality: N/A;   TONSILLECTOMY     TUBAL LIGATION      Social History   Socioeconomic History   Marital status: Divorced    Spouse name: Not on file   Number of children: Not on file   Years of education: Not on file   Highest education level: Not on file  Occupational History   Occupation: Retired  Tobacco Use   Smoking status: Former    Years: 4.00    Types: Cigarettes    Quit date: 01/19/1984    Years since quitting: 38.9    Passive exposure: Never   Smokeless tobacco: Never  Vaping Use   Vaping Use: Never used  Substance and Sexual Activity   Alcohol use: No   Drug use: No   Sexual activity: Not on file  Other Topics Concern   Not on file  Social History Narrative   Lives in [place] with her dtr.  Previously worked in a [employer] but has been out on disability since 2009.  Recently moved from disability to "retired" when she turned 47.  Sedentary.  Knits frequently for fun.   Social Determinants of Health   Financial Resource Strain: Not on file  Food Insecurity: No Food Insecurity (10/07/2022)   Hunger Vital Sign    Worried About Running Out of Food in the Last Year: Never true    Ran Out of Food in the Last Year: Never true  Transportation Needs: No Transportation Needs (10/07/2022)   PRAPARE - Hydrologist (Medical): No    Lack of Transportation (Non-Medical): No  Physical Activity: Unknown (08/06/2019)   Exercise Vital Sign    Days of Exercise per Week: Patient refused    Minutes of Exercise per Session: Patient refused  Stress: No Stress Concern Present (08/06/2019)   [place]     Feeling of Stress : Only a little  Social Connections: Unknown (08/06/2019)   Social Connection and Isolation Panel [NHANES]    Frequency of Communication with Friends and Family: Patient refused    Frequency of Social Gatherings with Friends and Family: Patient refused    Attends Religious Services: Patient refused    Active Member of Clubs or Organizations: Patient refused    Attends Archivist Meetings: Patient refused    Marital Status: Patient refused  Intimate Partner Violence: Not At Risk (10/07/2022)   Humiliation, Afraid, Rape, and Kick questionnaire    Fear of Current or Ex-Partner: No    Emotionally Abused: No    Physically Abused: No    Sexually Abused: No    Family History  Problem Relation Age of Onset   Hypertension Mother    Diabetes Mother  Hypertension Sister     Current Outpatient Medications  Medication Sig Dispense Refill   acetaminophen (TYLENOL) 500 MG tablet Take 1,000 mg by mouth every 6 (six) hours as needed for moderate pain or headache.     albuterol (VENTOLIN HFA) 108 (90 Base) MCG/ACT inhaler      amLODipine (NORVASC) 5 MG tablet Take 5 mg by mouth daily.     aspirin 81 MG chewable tablet Chew 1 tablet (81 mg total) by mouth daily. 30 tablet 1   atorvastatin (LIPITOR) 80 MG tablet Take 1 tablet (80 mg total) by mouth daily. (Patient taking differently: Take 80 mg by mouth at bedtime.) 30 tablet 1   AURYXIA 1 GM 210 MG(Fe) tablet Take 630 mg by mouth See admin instructions. Take 3tablets (630 mg) by mouth each meal and each snack     calcitRIOL (ROCALTROL) 0.5 MCG capsule Take 5 capsules (2.5 mcg total) by mouth Every Tuesday,Thursday,and Saturday with dialysis. 60 capsule 2   hydrALAZINE (APRESOLINE) 25 MG tablet Take 1 tablet (25 mg total) by mouth every 8 (eight) hours. 90 tablet 1   Hyprom-Naphaz-Polysorb-Zn Sulf (CLEAR EYES COMPLETE OP) Place 1 drop into both eyes daily as needed (dry eyes).     insulin detemir (LEVEMIR) 100 UNIT/ML  FlexPen Inject 10 Units into the skin daily with breakfast. 15 mL 1   Insulin Glargine Solostar (LANTUS) 100 UNIT/ML Solostar Pen SMARTSIG:15 Unit(s) SUB-Q Every Morning     Insulin Pen Needle 32G X 4 MM MISC Use with insulin pen to dispense insulin as directed 100 each 1   isosorbide mononitrate (IMDUR) 30 MG 24 hr tablet Take 1 tablet (30 mg total) by mouth daily. 30 tablet 1   lubiprostone (AMITIZA) 24 MCG capsule Take 24 mcg by mouth 2 (two) times daily as needed for constipation.     Methoxy PEG-Epoetin Beta (MIRCERA IJ) Mircera     metoCLOPramide (REGLAN) 5 MG tablet Take 5 mg by mouth 3 (three) times daily before meals.     multivitamin (RENA-VIT) TABS tablet Take 1 tablet by mouth daily.     Current Facility-Administered Medications  Medication Dose Route Frequency Provider Last Rate Last Admin   0.9 %  sodium chloride infusion  250 mL Intravenous PRN Broadus John, MD       sodium chloride flush (NS) 0.9 % injection 3 mL  3 mL Intravenous Q12H Broadus John, MD       sodium chloride flush (NS) 0.9 % injection 3 mL  3 mL Intravenous PRN Broadus John, MD        No Known Allergies   REVIEW OF SYSTEMS:   [X]$  denotes positive finding, [ ]$  denotes negative finding Cardiac  Comments:  Chest pain or chest pressure:    Shortness of breath upon exertion:    Short of breath when lying flat:    Irregular heart rhythm:        Vascular    Pain in calf, thigh, or hip brought on by ambulation:    Pain in feet at night that wakes you up from your sleep:     Blood clot in your veins:    Leg swelling:         Pulmonary    Oxygen at home:    Productive cough:     Wheezing:         Neurologic    Sudden weakness in arms or legs:     Sudden numbness in arms or legs:  Sudden onset of difficulty speaking or slurred speech:    Temporary loss of vision in one eye:     Problems with dizziness:         Gastrointestinal    Blood in stool:     Vomited blood:          Genitourinary    Burning when urinating:     Blood in urine:        Psychiatric    Major depression:         Hematologic    Bleeding problems:    Problems with blood clotting too easily:        Skin    Rashes or ulcers:        Constitutional    Fever or chills:      PHYSICAL EXAMINATION:  Vitals:   12/16/22 1202  BP: (!) 192/85  Pulse: 80  Resp: 20  Temp: 98.6 F (37 C)  TempSrc: Temporal  SpO2: 99%  Weight: 162 lb 1.6 oz (73.5 kg)  Height: 5' 4"$  (1.626 m)    General:  WDWN in NAD; vital signs documented above Gait: Normal HENT: WNL, normocephalic Pulmonary: normal non-labored breathing without wheezing Cardiac: regular HR Vascular Exam/Pulses:Brisk doppler Dp/ Pt/ Peroneal signals in right foot Extremities: with ischemic changes, without Gangrene , without cellulitis; without open wounds; dry ulceration of right medial great toe and plantar and medial aspect of right 2nd toe     Musculoskeletal: no muscle wasting or atrophy  Neurologic: A&O X 3;  No focal weakness or paresthesias are detected Psychiatric:  The pt has Normal affect.  ASSESSMENT/PLAN:: 72 y.o. female here for follow up for wound follow up of ulcers on 1st and 2nd toes. She is s/p Aortogram, Arteriogram of RLE on 10/15/22 by Dr. Virl Cagey. No intervention was indicated. RLE remains well perfused with brisk doppler signals. Overall wounds are stable. She is without claudication, pain, new tissue loss. - Continue painting with betadine - Keep feet protected  - Continue Aspirin and Statin - If wounds worsen still is possible option for retrograde attempt at recanalizing the PT. -Will have her return in another 6 weeks for wound check. She understands that should her wounds worsen or if she has any new or concerning symptoms she will follow up  sooner   Karoline Caldwell, PA-C [facility] [phone]  Clinic MD:   Roxanne Mins

## 2022-12-31 ENCOUNTER — Ambulatory Visit (INDEPENDENT_AMBULATORY_CARE_PROVIDER_SITE_OTHER): Payer: Medicare HMO | Admitting: Podiatry

## 2022-12-31 DIAGNOSIS — I96 Gangrene, not elsewhere classified: Secondary | ICD-10-CM

## 2022-12-31 DIAGNOSIS — I739 Peripheral vascular disease, unspecified: Secondary | ICD-10-CM

## 2022-12-31 NOTE — Progress Notes (Signed)
Subjective:  Patient ID: Melissa Howe, female    DOB: 09/10/1951,  MRN: [id number]  Chief Complaint  Patient presents with   Foot Ulcer    72 y.o. female presents with the above complaint.  Patient with follow-up of right hallux and second digit gangrene.  The discoloration seems to be getting better.  They have been doing Betadine wet-to-dry dressing   Review of Systems: Negative except as noted in the HPI. Denies N/V/F/Ch.  Past Medical History:  Diagnosis Date   Anemia    Chronic kidney disease    Constipation    Diabetes mellitus    Type II   Dyspnea    with exertion   History of blood transfusion    Hyperlipemia    Hypertension    Pneumonia 2019    Current Outpatient Medications:    acetaminophen (TYLENOL) 500 MG tablet, Take 1,000 mg by mouth every 6 (six) hours as needed for moderate pain or headache., Disp: , Rfl:    albuterol (VENTOLIN HFA) 108 (90 Base) MCG/ACT inhaler, , Disp: , Rfl:    amLODipine (NORVASC) 5 MG tablet, Take 5 mg by mouth daily., Disp: , Rfl:    aspirin 81 MG chewable tablet, Chew 1 tablet (81 mg total) by mouth daily., Disp: 30 tablet, Rfl: 1   atorvastatin (LIPITOR) 80 MG tablet, Take 1 tablet (80 mg total) by mouth daily. (Patient taking differently: Take 80 mg by mouth at bedtime.), Disp: 30 tablet, Rfl: 1   AURYXIA 1 GM 210 MG(Fe) tablet, Take 630 mg by mouth See admin instructions. Take 3tablets (630 mg) by mouth each meal and each snack, Disp: , Rfl:    calcitRIOL (ROCALTROL) 0.5 MCG capsule, Take 5 capsules (2.5 mcg total) by mouth Every Tuesday,Thursday,and Saturday with dialysis., Disp: 60 capsule, Rfl: 2   hydrALAZINE (APRESOLINE) 25 MG tablet, Take 1 tablet (25 mg total) by mouth every 8 (eight) hours., Disp: 90 tablet, Rfl: 1   Hyprom-Naphaz-Polysorb-Zn Sulf (CLEAR EYES COMPLETE OP), Place 1 drop into both eyes daily as needed (dry eyes)., Disp: , Rfl:    insulin detemir (LEVEMIR) 100 UNIT/ML FlexPen, Inject 10 Units into the skin  daily with breakfast., Disp: 15 mL, Rfl: 1   Insulin Glargine Solostar (LANTUS) 100 UNIT/ML Solostar Pen, SMARTSIG:15 Unit(s) SUB-Q Every Morning, Disp: , Rfl:    Insulin Pen Needle 32G X 4 MM MISC, Use with insulin pen to dispense insulin as directed, Disp: 100 each, Rfl: 1   isosorbide mononitrate (IMDUR) 30 MG 24 hr tablet, Take 1 tablet (30 mg total) by mouth daily., Disp: 30 tablet, Rfl: 1   lubiprostone (AMITIZA) 24 MCG capsule, Take 24 mcg by mouth 2 (two) times daily as needed for constipation., Disp: , Rfl:    Methoxy PEG-Epoetin Beta (MIRCERA IJ), Mircera, Disp: , Rfl:    metoCLOPramide (REGLAN) 5 MG tablet, Take 5 mg by mouth 3 (three) times daily before meals., Disp: , Rfl:    multivitamin (RENA-VIT) TABS tablet, Take 1 tablet by mouth daily., Disp: , Rfl:   Current Facility-Administered Medications:    0.9 %  sodium chloride infusion, 250 mL, Intravenous, PRN, Broadus John, MD   sodium chloride flush (NS) 0.9 % injection 3 mL, 3 mL, Intravenous, Q12H, Broadus John, MD   sodium chloride flush (NS) 0.9 % injection 3 mL, 3 mL, Intravenous, PRN, Broadus John, MD  Social History   Tobacco Use  Smoking Status Former   Years: 4.00   Types: Cigarettes  Quit date: 01/19/1984   Years since quitting: 38.9   Passive exposure: Never  Smokeless Tobacco Never    No Known Allergies Objective:  There were no vitals filed for this visit. There is no height or weight on file to calculate BMI. Constitutional Well developed. Well nourished.  Vascular Dorsalis pedis pulses palpable bilaterally. Posterior tibial pulses palpable bilaterally. Capillary refill normal to all digits.  No cyanosis or clubbing noted. Pedal hair growth normal.  Neurologic Normal speech. Oriented to person, place, and time. Epicritic sensation to light touch grossly present bilaterally.  Dermatologic Right hallux and second digit gangrene dry stable no signs of infection noted dark discoloration is  improving per the patient.  It is improving  Orthopedic: Normal joint ROM without pain or crepitus bilaterally. No visible deformities. No bony tenderness.   Radiographs: None Assessment:   No diagnosis found.  Plan:  Patient was evaluated and treated and all questions answered.  Right second and hallux gangrene improving -Clinically healed and reepithelialized.  At this time patient can return to regular shoes may be another few days of an Betadine wet-to-dry dressing.  Patient is in agreement with the plan. -Unfortunately patient does not have good circulation to the foot with AT being the primary flow to the foot. -  No follow-ups on file.

## 2023-01-27 ENCOUNTER — Ambulatory Visit: Payer: Medicare HMO

## 2023-02-03 ENCOUNTER — Ambulatory Visit: Payer: Medicare HMO

## 2023-02-04 ENCOUNTER — Ambulatory Visit (INDEPENDENT_AMBULATORY_CARE_PROVIDER_SITE_OTHER): Payer: Medicare HMO | Admitting: Physician Assistant

## 2023-02-04 VITALS — BP 170/73 | HR 78 | Temp 97.8°F | Wt 169.0 lb

## 2023-02-04 DIAGNOSIS — N186 End stage renal disease: Secondary | ICD-10-CM | POA: Diagnosis not present

## 2023-02-04 DIAGNOSIS — I739 Peripheral vascular disease, unspecified: Secondary | ICD-10-CM | POA: Diagnosis not present

## 2023-02-04 NOTE — Progress Notes (Signed)
Office Note     CC:  follow up Requesting Provider:  Raymon MuttonSmith, Fred A Jr., FNP  HPI: Melissa Howe is a 72 y.o. (01/12/1951) female who presents for evaluation of right second toe wound.  She underwent diagnostic angiogram on 10/15/2022 demonstrating single-vessel runoff to the foot via anterior tibial artery.  She believes the wounds on her toes continue to heal well.  She believes her insulin-dependent diabetes is under control.  She is on aspirin and statin daily.  She denies tobacco use.  She is ambulatory without a cane or walker.  Past medical history also significant for end-stage renal disease on hemodialysis.  Left arm AV fistula is working well.   Past Medical History:  Diagnosis Date   Anemia    Chronic kidney disease    Constipation    Diabetes mellitus    Type II   Dyspnea    with exertion   History of blood transfusion    Hyperlipemia    Hypertension    Pneumonia 2019    Past Surgical History:  Procedure Laterality Date   ABDOMINAL AORTOGRAM W/LOWER EXTREMITY N/A 10/15/2022   Procedure: ABDOMINAL AORTOGRAM W/LOWER EXTREMITY;  Surgeon: Victorino Sparrowobins, Joshua E, MD;  [facility];  Service: Cardiovascular;  Laterality: N/A;   APPENDECTOMY     AV FISTULA PLACEMENT Left 10/05/2019   Procedure: INSERTION OF ARTERIOVENOUS (AV) GORE-TEX VASCULAR GRAFT LEFT ARM;  Surgeon: Larina EarthlyEarly, Todd F, MD;  [facility]: [facility] OR;  Service: Vascular;  Laterality: Left;   AV FISTULA PLACEMENT Left 02/27/2021   Procedure: LEFT ARM FIRST STAGE BASILIC VEIN  ARTERIOVENOUS (AV) FISTULA CREATION;  Surgeon: Maeola Harmanain, Brandon Christopher, MD;  [facility];  Service: Vascular;  Laterality: Left;   BASCILIC VEIN TRANSPOSITION Left 04/17/2021   Procedure: LEFT SECOND STAGE BASCILIC VEIN TRANSPOSITION;  Surgeon: Maeola Harmanain, Brandon Christopher, MD;  [facility];  Service: Vascular;  Laterality: Left;   BIOPSY  07/24/2020   Procedure: BIOPSY;  Surgeon: Sherrilyn Ristanis, Henry L III, MD;  [facility];   Service: Gastroenterology;;   BUBBLE STUDY  01/02/2020   Procedure: BUBBLE STUDY;  Surgeon: Parke PoissonAcharya, Gayatri A, MD;  [facility];  Service: Cardiology;;   COLONOSCOPY WITH PROPOFOL N/A 07/24/2020   Procedure: COLONOSCOPY WITH PROPOFOL;  Surgeon: Sherrilyn Ristanis, Henry L III, MD;  [facility];  Service: Gastroenterology;  Laterality: N/A;   CORONARY STENT INTERVENTION N/A 07/12/2021   Procedure: CORONARY STENT INTERVENTION;  Surgeon: Corky CraftsVaranasi, Jayadeep S, MD;  [facility];  Service: Cardiovascular;  Laterality: N/A;   CORONARY ULTRASOUND/IVUS N/A 07/12/2021   Procedure: Intravascular Ultrasound/IVUS;  Surgeon: Corky CraftsVaranasi, Jayadeep S, MD;  [facility];  Service: Cardiovascular;  Laterality: N/A;   ESOPHAGOGASTRODUODENOSCOPY (EGD) WITH PROPOFOL N/A 07/24/2020   Procedure: ESOPHAGOGASTRODUODENOSCOPY (EGD) WITH PROPOFOL;  Surgeon: Sherrilyn Ristanis, Henry L III, MD;  [facility];  Service: Gastroenterology;  Laterality: N/A;   EYE SURGERY Bilateral    Cataract   FOREIGN BODY REMOVAL Left 05/13/2018   Procedure: FOREIGN BODY REMOVAL left foot;  Surgeon: Cammy Copaean, Gregory Scott, MD;  [facility];  Service: Orthopedics;  Laterality: Left;   I & D EXTREMITY Left 05/21/2018   Procedure: LEFT FOOT DEBRIDEMENT AND WOUND CLOSURE;  Surgeon: Nadara Mustarduda, Marcus V, MD;  [facility];  Service: Orthopedics;  Laterality: Left;   LEFT HEART CATH AND CORONARY ANGIOGRAPHY N/A 07/12/2021   Procedure: LEFT HEART CATH AND CORONARY ANGIOGRAPHY;  Surgeon: Corky CraftsVaranasi, Jayadeep S, MD;  [facility];  Service: Cardiovascular;  Laterality: N/A;   POLYPECTOMY  07/24/2020   Procedure: POLYPECTOMY;  Surgeon: Sherrilyn Rist, MD;  Location: [facility] ENDOSCOPY;  Service: Gastroenterology;;   TEE WITHOUT CARDIOVERSION N/A 01/02/2020   Procedure: TRANSESOPHAGEAL ECHOCARDIOGRAM (TEE);  Surgeon: Parke Poisson, MD;  Location: [facility] ENDOSCOPY;  Service: Cardiology;  Laterality: N/A;    TONSILLECTOMY     TUBAL LIGATION      Social History   Socioeconomic History   Marital status: Divorced    Spouse name: Not on file   Number of children: Not on file   Years of education: Not on file   Highest education level: Not on file  Occupational History   Occupation: Retired  Tobacco Use   Smoking status: Former    Years: 4    Types: Cigarettes    Quit date: 01/19/1984    Years since quitting: 39.0    Passive exposure: Never   Smokeless tobacco: Never  Vaping Use   Vaping Use: Never used  Substance and Sexual Activity   Alcohol use: No   Drug use: No   Sexual activity: Not on file  Other Topics Concern   Not on file  Social History Narrative   Lives in [place] with her dtr.  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  Recently moved from disability to "retired" when she turned 48.  Sedentary.  Knits frequently for fun.   Social Determinants of Health   Financial Resource Strain: Not on file  Food Insecurity: No Food Insecurity (10/07/2022)   Hunger Vital Sign    Worried About Running Out of Food in the Last Year: Never true    Ran Out of Food in the Last Year: Never true  Transportation Needs: No Transportation Needs (10/07/2022)   PRAPARE - Administrator, Civil Service (Medical): No    Lack of Transportation (Non-Medical): No  Physical Activity: Unknown (08/06/2019)   Exercise Vital Sign    Days of Exercise per Week: Patient declined    Minutes of Exercise per Session: Patient declined  Stress: No Stress Concern Present (08/06/2019)   [employer] of Occupational Health - Occupational Stress Questionnaire    Feeling of Stress : Only a little  Social Connections: Unknown (08/06/2019)   Social Connection and Isolation Panel [NHANES]    Frequency of Communication with Friends and Family: Patient declined    Frequency of Social Gatherings with Friends and Family: Patient declined    Attends Religious Services:  Patient declined    Database administrator or Organizations: Patient declined    Attends Banker Meetings: Patient declined    Marital Status: Patient declined  Intimate Partner Violence: Not At Risk (10/07/2022)   Humiliation, Afraid, Rape, and Kick questionnaire    Fear of Current or Ex-Partner: No    Emotionally Abused: No    Physically Abused: No    Sexually Abused: No    Family History  Problem Relation Age of Onset   Hypertension Mother    Diabetes Mother    Hypertension Sister     Current Outpatient Medications  Medication Sig Dispense Refill   acetaminophen (TYLENOL) 500 MG tablet Take 1,000 mg by mouth every 6 (six) hours as needed for moderate pain or headache.     albuterol (VENTOLIN HFA) 108 (90 Base) MCG/ACT inhaler      amLODipine (NORVASC) 5 MG tablet Take 5 mg by mouth daily.  aspirin 81 MG chewable tablet Chew 1 tablet (81 mg total) by mouth daily. 30 tablet 1   atorvastatin (LIPITOR) 80 MG tablet Take 1 tablet (80 mg total) by mouth daily. (Patient taking differently: Take 80 mg by mouth at bedtime.) 30 tablet 1   AURYXIA 1 GM 210 MG(Fe) tablet Take 630 mg by mouth See admin instructions. Take 3tablets (630 mg) by mouth each meal and each snack     calcitRIOL (ROCALTROL) 0.5 MCG capsule Take 5 capsules (2.5 mcg total) by mouth Every Tuesday,Thursday,and Saturday with dialysis. 60 capsule 2   hydrALAZINE (APRESOLINE) 25 MG tablet Take 1 tablet (25 mg total) by mouth every 8 (eight) hours. 90 tablet 1   Hyprom-Naphaz-Polysorb-Zn Sulf (CLEAR EYES COMPLETE OP) Place 1 drop into both eyes daily as needed (dry eyes).     Insulin Glargine Solostar (LANTUS) 100 UNIT/ML Solostar Pen SMARTSIG:15 Unit(s) SUB-Q Every Morning     Insulin Pen Needle 32G X 4 MM MISC Use with insulin pen to dispense insulin as directed 100 each 1   isosorbide mononitrate (IMDUR) 30 MG 24 hr tablet Take 1 tablet (30 mg total) by mouth daily. 30 tablet 1   lubiprostone (AMITIZA) 24  MCG capsule Take 24 mcg by mouth 2 (two) times daily as needed for constipation.     Methoxy PEG-Epoetin Beta (MIRCERA IJ) Mircera     metoCLOPramide (REGLAN) 5 MG tablet Take 5 mg by mouth 3 (three) times daily before meals.     multivitamin (RENA-VIT) TABS tablet Take 1 tablet by mouth daily.     insulin detemir (LEVEMIR) 100 UNIT/ML FlexPen Inject 10 Units into the skin daily with breakfast. 15 mL 1   Current Facility-Administered Medications  Medication Dose Route Frequency Provider Last Rate Last Admin   0.9 %  sodium chloride infusion  250 mL Intravenous PRN Victorino Sparrow, MD       sodium chloride flush (NS) 0.9 % injection 3 mL  3 mL Intravenous Q12H Victorino Sparrow, MD       sodium chloride flush (NS) 0.9 % injection 3 mL  3 mL Intravenous PRN Victorino Sparrow, MD        No Known Allergies   REVIEW OF SYSTEMS:   [X]  denotes positive finding, [ ]  denotes negative finding Cardiac  Comments:  Chest pain or chest pressure:    Shortness of breath upon exertion:    Short of breath when lying flat:    Irregular heart rhythm:        Vascular    Pain in calf, thigh, or hip brought on by ambulation:    Pain in feet at night that wakes you up from your sleep:     Blood clot in your veins:    Leg swelling:         Pulmonary    Oxygen at home:    Productive cough:     Wheezing:         Neurologic    Sudden weakness in arms or legs:     Sudden numbness in arms or legs:     Sudden onset of difficulty speaking or slurred speech:    Temporary loss of vision in one eye:     Problems with dizziness:         Gastrointestinal    Blood in stool:     Vomited blood:         Genitourinary    Burning when urinating:     Blood  in urine:        Psychiatric    Major depression:         Hematologic    Bleeding problems:    Problems with blood clotting too easily:        Skin    Rashes or ulcers:        Constitutional    Fever or chills:      PHYSICAL  EXAMINATION:  Vitals:   02/04/23 0849  BP: (!) 170/73  Pulse: 78  Temp: 97.8 F (36.6 C)  TempSrc: Temporal  SpO2: 99%  Weight: 169 lb (76.7 kg)    General:  WDWN in NAD; vital signs documented above Gait: Not observed HENT: WNL, normocephalic Pulmonary: normal non-labored breathing , without Rales, rhonchi,  wheezing Cardiac: regular HR Abdomen: soft, NT, no masses Skin: without rashes Vascular Exam/Pulses: absent pedal pulses; brisk R DP signal Extremities: R 2nd toe wound without drainage or surrounding erythema pictured below Musculoskeletal: no muscle wasting or atrophy  Neurologic: A&O X 3 Psychiatric:  The pt has Normal affect.    ASSESSMENT/PLAN:: 72 y.o. female here for follow up for wound check of slow to heal toe wounds of the right foot  -Right great toe wound has since healed since last appointment.  Wound remains on second toe however it appears to be healing well without drainage or erythema.  Patient states she believes her diabetes is under better control.  She is on aspirin and statin daily.  She denies any rest pain of the right foot.  She is painting the wound with Betadine daily.  We will repeat ABIs in 3 months.  The patient will return office sooner if the wound worsens or fails to improve.  Per Dr. Karin Lieu he could attempt retrograde posterior tibial artery access if her wounds fail to heal.   Emilie Rutter, PA-C [facility] [phone]  Clinic MD:   Randie Heinz

## 2023-02-09 ENCOUNTER — Other Ambulatory Visit: Payer: Self-pay

## 2023-02-09 DIAGNOSIS — I739 Peripheral vascular disease, unspecified: Secondary | ICD-10-CM

## 2023-02-25 ENCOUNTER — Encounter: Payer: Self-pay | Admitting: Vascular Surgery

## 2023-02-25 ENCOUNTER — Ambulatory Visit (INDEPENDENT_AMBULATORY_CARE_PROVIDER_SITE_OTHER): Payer: Medicare HMO | Admitting: Vascular Surgery

## 2023-02-25 VITALS — BP 194/79 | HR 90 | Temp 97.8°F | Wt 167.0 lb

## 2023-02-25 DIAGNOSIS — N186 End stage renal disease: Secondary | ICD-10-CM

## 2023-02-25 NOTE — Progress Notes (Signed)
Patient ID: Melissa Howe, female   DOB: Sep 04, 1951, 72 y.o.   MRN: [id number]  Reason for Consult: No chief complaint on file.   Referred by Arita Miss, MD  Subjective:     HPI:  Melissa Howe is a 72 y.o. female history of end-stage renal disease currently on dialysis via left arm AV fistula which was placed several years ago.  She is now considering peritoneal dialysis so that she can dialyze at home.  She does have a history of an open appendectomy no other previous abdominal surgeries and does not have any ongoing abdominal pain.  She does not take any blood thinners.  She had a previous angiogram for discoloration of her toes on the right which remains somewhat discolored but she does not have any active wounds.  Past Medical History:  Diagnosis Date   Anemia    Chronic kidney disease    Constipation    Diabetes mellitus    Type II   Dyspnea    with exertion   History of blood transfusion    Hyperlipemia    Hypertension    Pneumonia 2019   Family History  Problem Relation Age of Onset   Hypertension Mother    Diabetes Mother    Hypertension Sister    Past Surgical History:  Procedure Laterality Date   ABDOMINAL AORTOGRAM W/LOWER EXTREMITY N/A 10/15/2022   Procedure: ABDOMINAL AORTOGRAM W/LOWER EXTREMITY;  Surgeon: Victorino Sparrow, MD;  [facility] INVASIVE CV LAB;  Service: Cardiovascular;  Laterality: N/A;   APPENDECTOMY     AV FISTULA PLACEMENT Left 10/05/2019   Procedure: INSERTION OF ARTERIOVENOUS (AV) GORE-TEX VASCULAR GRAFT LEFT ARM;  Surgeon: Larina Earthly, MD;  Location: [facility] OR;  Service: Vascular;  Laterality: Left;   AV FISTULA PLACEMENT Left 02/27/2021   Procedure: LEFT ARM FIRST STAGE BASILIC VEIN  ARTERIOVENOUS (AV) FISTULA CREATION;  Surgeon: Maeola Harman, MD;  Location: [facility] OR;  Service: Vascular;  Laterality: Left;   BASCILIC VEIN TRANSPOSITION Left 04/17/2021   Procedure: LEFT SECOND STAGE BASCILIC VEIN TRANSPOSITION;  Surgeon:  Maeola Harman, MD;  Location: [facility] OR;  Service: Vascular;  Laterality: Left;   BIOPSY  07/24/2020   Procedure: BIOPSY;  Surgeon: Sherrilyn Rist, MD;  Location: [facility] ENDOSCOPY;  Service: Gastroenterology;;   BUBBLE STUDY  01/02/2020   Procedure: BUBBLE STUDY;  Surgeon: Parke Poisson, MD;  Location: [facility] ENDOSCOPY;  Service: Cardiology;;   COLONOSCOPY WITH PROPOFOL N/A 07/24/2020   Procedure: COLONOSCOPY WITH PROPOFOL;  Surgeon: Sherrilyn Rist, MD;  Location: [facility] ENDOSCOPY;  Service: Gastroenterology;  Laterality: N/A;   CORONARY STENT INTERVENTION N/A 07/12/2021   Procedure: CORONARY STENT INTERVENTION;  Surgeon: Corky Crafts, MD;  Location: [facility] INVASIVE CV LAB;  Service: Cardiovascular;  Laterality: N/A;   CORONARY ULTRASOUND/IVUS N/A 07/12/2021   Procedure: Intravascular Ultrasound/IVUS;  Surgeon: Corky Crafts, MD;  Location: [facility] INVASIVE CV LAB;  Service: Cardiovascular;  Laterality: N/A;   ESOPHAGOGASTRODUODENOSCOPY (EGD) WITH PROPOFOL N/A 07/24/2020   Procedure: ESOPHAGOGASTRODUODENOSCOPY (EGD) WITH PROPOFOL;  Surgeon: Sherrilyn Rist, MD;  Location: [facility] ENDOSCOPY;  Service: Gastroenterology;  Laterality: N/A;   EYE SURGERY Bilateral    Cataract   FOREIGN BODY REMOVAL Left 05/13/2018   Procedure: FOREIGN BODY REMOVAL left foot;  Surgeon: Cammy Copa, MD;  Location: [facility] OR;  Service: Orthopedics;  Laterality: Left;   I & D EXTREMITY Left 05/21/2018   Procedure: LEFT FOOT DEBRIDEMENT AND WOUND CLOSURE;  Surgeon: Nadara Mustard, MD;  Location: [facility] OR;  Service: Orthopedics;  Laterality: Left;   LEFT HEART CATH AND CORONARY ANGIOGRAPHY N/A 07/12/2021   Procedure: LEFT HEART CATH AND CORONARY ANGIOGRAPHY;  Surgeon: Corky Crafts, MD;  Location: [facility] INVASIVE CV LAB;  Service: Cardiovascular;  Laterality: N/A;   POLYPECTOMY  07/24/2020   Procedure: POLYPECTOMY;  Surgeon: Sherrilyn Rist, MD;  Location: [facility] ENDOSCOPY;  Service: Gastroenterology;;   TEE WITHOUT  CARDIOVERSION N/A 01/02/2020   Procedure: TRANSESOPHAGEAL ECHOCARDIOGRAM (TEE);  Surgeon: Parke Poisson, MD;  Location: [facility] ENDOSCOPY;  Service: Cardiology;  Laterality: N/A;   TONSILLECTOMY     TUBAL LIGATION      Short Social History:  Social History   Tobacco Use   Smoking status: Former    Years: 4    Types: Cigarettes    Quit date: 01/19/1984    Years since quitting: 39.1    Passive exposure: Never   Smokeless tobacco: Never  Substance Use Topics   Alcohol use: No    No Known Allergies  Current Outpatient Medications  Medication Sig Dispense Refill   acetaminophen (TYLENOL) 500 MG tablet Take 1,000 mg by mouth every 6 (six) hours as needed for moderate pain or headache.     albuterol (VENTOLIN HFA) 108 (90 Base) MCG/ACT inhaler      amLODipine (NORVASC) 5 MG tablet Take 5 mg by mouth daily.     aspirin 81 MG chewable tablet Chew 1 tablet (81 mg total) by mouth daily. 30 tablet 1   atorvastatin (LIPITOR) 80 MG tablet Take 1 tablet (80 mg total) by mouth daily. (Patient taking differently: Take 80 mg by mouth at bedtime.) 30 tablet 1   AURYXIA 1 GM 210 MG(Fe) tablet Take 630 mg by mouth See admin instructions. Take 3tablets (630 mg) by mouth each meal and each snack     calcitRIOL (ROCALTROL) 0.5 MCG capsule Take 5 capsules (2.5 mcg total) by mouth Every Tuesday,Thursday,and Saturday with dialysis. 60 capsule 2   hydrALAZINE (APRESOLINE) 25 MG tablet Take 1 tablet (25 mg total) by mouth every 8 (eight) hours. 90 tablet 1   Hyprom-Naphaz-Polysorb-Zn Sulf (CLEAR EYES COMPLETE OP) Place 1 drop into both eyes daily as needed (dry eyes).     Insulin Glargine Solostar (LANTUS) 100 UNIT/ML Solostar Pen SMARTSIG:15 Unit(s) SUB-Q Every Morning     Insulin Pen Needle 32G X 4 MM MISC Use with insulin pen to dispense insulin as directed 100 each 1   isosorbide mononitrate (IMDUR) 30 MG 24 hr tablet Take 1 tablet (30 mg total) by mouth daily. 30 tablet 1   lubiprostone (AMITIZA) 24 MCG  capsule Take 24 mcg by mouth 2 (two) times daily as needed for constipation.     Methoxy PEG-Epoetin Beta (MIRCERA IJ) Mircera     metoCLOPramide (REGLAN) 5 MG tablet Take 5 mg by mouth 3 (three) times daily before meals.     multivitamin (RENA-VIT) TABS tablet Take 1 tablet by mouth daily.     Current Facility-Administered Medications  Medication Dose Route Frequency Provider Last Rate Last Admin   0.9 %  sodium chloride infusion  250 mL Intravenous PRN Victorino Sparrow, MD       sodium chloride flush (NS) 0.9 % injection 3 mL  3 mL Intravenous Q12H Victorino Sparrow, MD       sodium chloride flush (NS) 0.9 % injection 3 mL  3 mL Intravenous PRN Victorino Sparrow, MD  Review of Systems  Constitutional:  Constitutional negative. HENT: HENT negative.  Eyes: Eyes negative.  Respiratory: Respiratory negative.  Cardiovascular: Cardiovascular negative.  GI: Gastrointestinal negative.  Musculoskeletal: Musculoskeletal negative.  Skin: Skin negative.  Neurological: Neurological negative. Hematologic: Hematologic/lymphatic negative.  Psychiatric: Psychiatric negative.        Objective:  Objective   Vitals:   02/25/23 1104  BP: (!) 194/79  Pulse: 90  Temp: 97.8 F (36.6 C)  TempSrc: Temporal  SpO2: 98%  Weight: 167 lb (75.8 kg)   Body mass index is 28.67 kg/m.  Physical Exam HENT:     Head: Normocephalic.     Nose: Nose normal.  Eyes:     Pupils: Pupils are equal, round, and reactive to light.  Cardiovascular:     Rate and Rhythm: Normal rate.  Pulmonary:     Effort: Pulmonary effort is normal.  Abdominal:     General: Abdomen is flat.  Musculoskeletal:        General: Normal range of motion.     Cervical back: Normal range of motion.     Right lower leg: No edema.     Left lower leg: No edema.     Comments: Strong thrill left upper arm AV fistula  Skin:    General: Skin is warm.  Neurological:     General: No focal deficit present.     Mental Status: She  is alert.  Psychiatric:        Mood and Affect: Mood normal.     Data: No studies     Assessment/Plan:     72 year old female with end-stage renal disease currently dialyzing via left arm AV fistula with plans for peritoneal dialysis.  I discussed the risk benefits and alternatives to peritoneal dialysis as well as the possible need for future procedures and the risk of infection.  She demonstrates good understanding we will plan for laparoscopic continuous ambulatory peritoneal dialysis catheter placement on a nondialysis day in the near future and she can continue aspirin perioperatively.     Maeola Harman MD [facility]

## 2023-03-02 ENCOUNTER — Telehealth: Payer: Self-pay | Admitting: *Deleted

## 2023-03-02 ENCOUNTER — Other Ambulatory Visit: Payer: Self-pay

## 2023-03-02 DIAGNOSIS — N186 End stage renal disease: Secondary | ICD-10-CM

## 2023-03-02 NOTE — Telephone Encounter (Signed)
   Name: Melissa Howe  DOB: Mar 05, 1951  MRN: [id number]  Primary Cardiologist: Reatha Harps, MD  Chart reviewed as part of pre-operative protocol coverage. Because of Melissa Howe's past medical history and time since last visit, she will require a follow-up in-office visit in order to better assess preoperative cardiovascular risk.  Pre-op covering staff: - Please schedule appointment and call patient to inform them. If patient already had an upcoming appointment within acceptable timeframe, please add "pre-op clearance" to the appointment notes so provider is aware. - Please contact requesting surgeon's office via preferred method (i.e, phone, fax) to inform them of need for appointment prior to surgery.    Carlos Levering, NP  03/02/2023, 4:55 PM

## 2023-03-02 NOTE — Telephone Encounter (Signed)
   Pre-operative Risk Assessment    Patient Name: Melissa Howe  DOB: [birth year] MRN: [id number]      Request for Surgical Clearance    Procedure:   LAPAROSCOPIC PERITONEAL DIALYSIS CATHETER PLACEMENT , POSSIBLE OMENTOPEXY   Date of Surgery:  Clearance 05/01/23                                 Surgeon:  DR. Lemar Livings Surgeon's Group or Practice Name:  [facility] Phone number:  [phone] Fax number:  [phone]   Type of Clearance Requested:   - Medical ; PER DR. CAIN NO MEDICATIONS ARE NEEDING TO BE HELD   Type of Anesthesia:  General    Additional requests/questions:    Elpidio Anis   03/02/2023, 2:30 PM

## 2023-03-03 NOTE — Telephone Encounter (Signed)
1st attempt to reach pt regarding surgical clearance and the need for an in-office appointment.  Left a message for pt to call back and get that scheduled.  

## 2023-03-04 NOTE — Telephone Encounter (Signed)
Lvm for pt to call office to schedule pre op appt.

## 2023-03-05 NOTE — Telephone Encounter (Signed)
Patient has been scheduled for a preop clearance appt on 04/13/23 at 1:00 PM. Will route to requesting surgeons office to make them aware.

## 2023-03-12 ENCOUNTER — Encounter (HOSPITAL_BASED_OUTPATIENT_CLINIC_OR_DEPARTMENT_OTHER): Payer: Self-pay | Admitting: Emergency Medicine

## 2023-03-12 ENCOUNTER — Emergency Department (HOSPITAL_BASED_OUTPATIENT_CLINIC_OR_DEPARTMENT_OTHER)
Admission: EM | Admit: 2023-03-12 | Discharge: 2023-03-12 | Disposition: A | Discharge status: Home / Self Care | Payer: Medicare HMO | Attending: Emergency Medicine | Admitting: Emergency Medicine

## 2023-03-12 ENCOUNTER — Other Ambulatory Visit: Payer: Self-pay

## 2023-03-12 ENCOUNTER — Emergency Department (HOSPITAL_BASED_OUTPATIENT_CLINIC_OR_DEPARTMENT_OTHER): Payer: Medicare HMO | Admitting: Radiology

## 2023-03-12 DIAGNOSIS — Z1152 Encounter for screening for COVID-19: Secondary | ICD-10-CM | POA: Insufficient documentation

## 2023-03-12 DIAGNOSIS — Z794 Long term (current) use of insulin: Secondary | ICD-10-CM | POA: Insufficient documentation

## 2023-03-12 DIAGNOSIS — Z7982 Long term (current) use of aspirin: Secondary | ICD-10-CM | POA: Diagnosis not present

## 2023-03-12 DIAGNOSIS — J40 Bronchitis, not specified as acute or chronic: Secondary | ICD-10-CM | POA: Diagnosis not present

## 2023-03-12 DIAGNOSIS — R059 Cough, unspecified: Secondary | ICD-10-CM | POA: Diagnosis present

## 2023-03-12 DIAGNOSIS — Z79899 Other long term (current) drug therapy: Secondary | ICD-10-CM | POA: Insufficient documentation

## 2023-03-12 DIAGNOSIS — R030 Elevated blood-pressure reading, without diagnosis of hypertension: Secondary | ICD-10-CM

## 2023-03-12 DIAGNOSIS — R509 Fever, unspecified: Secondary | ICD-10-CM | POA: Diagnosis not present

## 2023-03-12 DIAGNOSIS — R051 Acute cough: Secondary | ICD-10-CM

## 2023-03-12 DIAGNOSIS — I10 Essential (primary) hypertension: Secondary | ICD-10-CM

## 2023-03-12 LAB — RESP PANEL BY RT-PCR (RSV, FLU A&B, COVID)  RVPGX2
Influenza A by PCR: NEGATIVE
Influenza B by PCR: NEGATIVE
Resp Syncytial Virus by PCR: NEGATIVE
SARS Coronavirus 2 by RT PCR: NEGATIVE

## 2023-03-12 MED ORDER — AMLODIPINE BESYLATE 5 MG PO TABS
5.0000 mg | ORAL_TABLET | Freq: Every day | ORAL | Status: DC
  Administered 2023-03-12: 5 mg via ORAL
  Filled 2023-03-12: qty 1

## 2023-03-12 MED ORDER — AZITHROMYCIN 250 MG PO TABS: 250.0000 mg | ORAL_TABLET | Freq: Every day | ORAL | 0 refills | Status: AC

## 2023-03-12 MED ORDER — AZITHROMYCIN 250 MG PO TABS
500.0000 mg | ORAL_TABLET | Freq: Once | ORAL | Status: AC
  Administered 2023-03-12: 500 mg via ORAL
  Filled 2023-03-12: qty 2

## 2023-03-12 MED ORDER — HYDRALAZINE HCL 25 MG PO TABS
25.0000 mg | ORAL_TABLET | Freq: Once | ORAL | Status: AC
  Administered 2023-03-12: 25 mg via ORAL
  Filled 2023-03-12: qty 1

## 2023-03-12 MED ORDER — ACETAMINOPHEN 500 MG PO TABS
1000.0000 mg | ORAL_TABLET | Freq: Once | ORAL | Status: AC
  Administered 2023-03-12: 1000 mg via ORAL
  Filled 2023-03-12: qty 2

## 2023-03-12 NOTE — ED Provider Notes (Signed)
[place] [facility] DEPARTMENT AT [facility] Provider Note   CSN: [id number] Arrival date & time: 03/12/23  1747     History  No chief complaint on file.   Melissa Howe is a 72 y.o. female.  Patient c/o cough in the past 3 days. Generally non productive, occasionally small amount phlegm. No hemoptysis. Mild nasal congestion. No sore throat or trouble swallowing. No sob.  Low grade fever in ED.  Denies chills/sweats. No chest pain or exertional chest pain. No sob. No abd pain or nvd. No rash. No extremity pain or swelling. No known ill contacts.   The history is provided by the patient.       Home Medications Prior to Admission medications   Medication Sig Start Date End Date Taking? Authorizing Provider  acetaminophen (TYLENOL) 500 MG tablet Take 1,000 mg by mouth every 6 (six) hours as needed for moderate pain or headache.    [provider]  albuterol (VENTOLIN HFA) 108 (90 Base) MCG/ACT inhaler  09/02/21   [provider]  amLODipine (NORVASC) 5 MG tablet Take 5 mg by mouth daily.    [provider]  aspirin 81 MG chewable tablet Chew 1 tablet (81 mg total) by mouth daily. 07/13/21   Ghimire, Werner Lean, MD  atorvastatin (LIPITOR) 80 MG tablet Take 1 tablet (80 mg total) by mouth daily. Patient taking differently: Take 80 mg by mouth at bedtime. 07/13/21   Ghimire, Werner Lean, MD  AURYXIA 1 GM 210 MG(Fe) tablet Take 630 mg by mouth See admin instructions. Take 3tablets (630 mg) by mouth each meal and each snack 06/18/20   [provider]  calcitRIOL (ROCALTROL) 0.5 MCG capsule Take 5 capsules (2.5 mcg total) by mouth Every Tuesday,Thursday,and Saturday with dialysis. 07/24/20   Pokhrel, Rebekah Chesterfield, MD  hydrALAZINE (APRESOLINE) 25 MG tablet Take 1 tablet (25 mg total) by mouth every 8 (eight) hours. 07/13/21   Ghimire, Werner Lean, MD  Hyprom-Naphaz-Polysorb-Zn Sulf (CLEAR EYES COMPLETE OP) Place 1 drop into both eyes daily as needed (dry  eyes).    [provider]  Insulin Glargine Solostar (LANTUS) 100 UNIT/ML Solostar Pen SMARTSIG:15 Unit(s) SUB-Q Every Morning 11/11/22   [provider]  Insulin Pen Needle 32G X 4 MM MISC Use with insulin pen to dispense insulin as directed 09/08/16   Tat, Onalee Hua, MD  isosorbide mononitrate (IMDUR) 30 MG 24 hr tablet Take 1 tablet (30 mg total) by mouth daily. 07/13/21   Ghimire, Werner Lean, MD  lubiprostone (AMITIZA) 24 MCG capsule Take 24 mcg by mouth 2 (two) times daily as needed for constipation. 01/22/21   [provider]  Methoxy PEG-Epoetin Beta (MIRCERA IJ) Mircera 12/11/22 12/10/23  [provider]  metoCLOPramide (REGLAN) 5 MG tablet Take 5 mg by mouth 3 (three) times daily before meals. 09/19/22   [provider]  multivitamin (RENA-VIT) TABS tablet Take 1 tablet by mouth daily. 07/10/22   [provider]      Allergies    Patient has no known allergies.    Review of Systems   Review of Systems  Constitutional:  Negative for chills and diaphoresis.  HENT:  Positive for congestion and rhinorrhea. Negative for sore throat.   Eyes:  Negative for pain and redness.  Respiratory:  Positive for cough. Negative for shortness of breath.   Cardiovascular:  Negative for chest pain and leg swelling.  Gastrointestinal:  Negative for abdominal pain, nausea and vomiting.  Genitourinary:  Negative for dysuria and flank  pain.  Musculoskeletal:  Negative for back pain, neck pain and neck stiffness.  Skin:  Negative for rash.  Neurological:  Negative for speech difficulty, weakness, numbness and headaches.  Hematological:  Does not bruise/bleed easily.  Psychiatric/Behavioral:  Negative for confusion.     Physical Exam Updated Vital Signs BP (!) 213/88   Pulse 91   Temp 100.3 F (37.9 C) (Oral)   Resp 17   SpO2 94%  Physical Exam Vitals and nursing note reviewed.  Constitutional:      Appearance: Normal appearance. She is well-developed.   HENT:     Head: Atraumatic.     Nose: Nose normal.     Mouth/Throat:     Mouth: Mucous membranes are moist.     Pharynx: Oropharynx is clear. No oropharyngeal exudate or posterior oropharyngeal erythema.  Eyes:     General: No scleral icterus.    Conjunctiva/sclera: Conjunctivae normal.     Pupils: Pupils are equal, round, and reactive to light.  Neck:     Trachea: No tracheal deviation.     Comments: No stiffness or rigidity.  Cardiovascular:     Rate and Rhythm: Normal rate and regular rhythm.     Pulses: Normal pulses.     Heart sounds: Normal heart sounds. No murmur heard.    No friction rub. No gallop.  Pulmonary:     Effort: Pulmonary effort is normal. No respiratory distress.     Breath sounds: Normal breath sounds.     Comments: Coughing/upper resp congestion.  Abdominal:     General: There is no distension.     Palpations: Abdomen is soft.     Tenderness: There is no abdominal tenderness.  Genitourinary:    Comments: No cva tenderness.  Musculoskeletal:        General: No swelling or tenderness.     Cervical back: Normal range of motion and neck supple. No rigidity or tenderness. No muscular tenderness.     Right lower leg: No edema.     Left lower leg: No edema.  Lymphadenopathy:     Cervical: No cervical adenopathy.  Skin:    General: Skin is warm and dry.     Findings: No rash.  Neurological:     Mental Status: She is alert.     Comments: Alert, speech normal. Motor/sens grossly intact bil.   Psychiatric:        Mood and Affect: Mood normal.     ED Results / Procedures / Treatments   Labs (all labs ordered are listed, but only abnormal results are displayed) Results for orders placed or performed during the hospital encounter of 03/12/23  Resp panel by RT-PCR (RSV, Flu A&B, Covid) Anterior Nasal Swab   Specimen: Anterior Nasal Swab  Result Value Ref Range   SARS Coronavirus 2 by RT PCR NEGATIVE NEGATIVE   Influenza A by PCR NEGATIVE NEGATIVE    Influenza B by PCR NEGATIVE NEGATIVE   Resp Syncytial Virus by PCR NEGATIVE NEGATIVE   DG Chest 2 View  Result Date: 03/12/2023 CLINICAL DATA:  Cough, fever EXAM: CHEST - 2 VIEW COMPARISON:  07/09/2021 FINDINGS: The lungs are symmetrically well expanded. Bilateral perihilar bronchitic changes are identified in keeping with airway inflammation. No confluent pulmonary infiltrates. No pneumothorax or pleural effusion. Cardiac size within normal limits. Pulmonary vascularity is normal. Osseous structures are age-appropriate. IMPRESSION: 1. Bronchitic changes. No focal pulmonary infiltrate. Electronically Signed   By: Helyn Numbers M.D.   On: 03/12/2023 18:34  EKG None  Radiology DG Chest 2 View  Result Date: 03/12/2023 CLINICAL DATA:  Cough, fever EXAM: CHEST - 2 VIEW COMPARISON:  07/09/2021 FINDINGS: The lungs are symmetrically well expanded. Bilateral perihilar bronchitic changes are identified in keeping with airway inflammation. No confluent pulmonary infiltrates. No pneumothorax or pleural effusion. Cardiac size within normal limits. Pulmonary vascularity is normal. Osseous structures are age-appropriate. IMPRESSION: 1. Bronchitic changes. No focal pulmonary infiltrate. Electronically Signed   By: Helyn Numbers M.D.   On: 03/12/2023 18:34    Procedures Procedures    Medications Ordered in ED Medications  amLODipine (NORVASC) tablet 5 mg (has no administration in time range)  hydrALAZINE (APRESOLINE) tablet 25 mg (has no administration in time range)  azithromycin (ZITHROMAX) tablet 500 mg (500 mg Oral Given 03/12/23 1933)  acetaminophen (TYLENOL) tablet 1,000 mg (1,000 mg Oral Given 03/12/23 1933)    ED Course/ Medical Decision Making/ A&P                             Medical Decision Making Problems Addressed: Acute cough: acute illness or injury Acute febrile illness: acute illness or injury with systemic symptoms that poses a threat to life or bodily functions Bronchitis:  acute illness or injury Elevated blood pressure reading: acute illness or injury Essential hypertension: chronic illness or injury with exacerbation, progression, or side effects of treatment that poses a threat to life or bodily functions Low grade fever: acute illness or injury  Amount and/or Complexity of Data Reviewed External Data Reviewed: notes. Labs: ordered. Decision-making details documented in ED Course. Radiology: ordered and independent interpretation performed. Decision-making details documented in ED Course.  Risk OTC drugs. Prescription drug management. Decision regarding hospitalization.   Iv ns. Continuous pulse ox and cardiac monitoring. Labs ordered/sent. Imaging ordered.   Differential diagnosis includes covid, pna, bronchitis, viral syndrome, etc. Dispo decision including potential need for admission considered - will get labs and imaging and reassess.   Reviewed nursing notes and prior charts for additional history. External reports reviewed.   Cardiac monitor: sinus rhythm, rate 90.  Labs reviewed/interpreted by me - covid and flu neg.   Xrays reviewed/interpreted by me - no pna. ?bronchitis.   Zithromax po, acetaminophen po. Po fluids.  Pt breathing comfortably, no distress, pulse ox 99% room air. Bp is high, hx same. Pt indicates has her meds at home but has not taken yet today. Pt given dose of her bp meds. No headache. No chest pain. No sob. No swelling. No neuro c/o.   Pt currently appears stable for d/c.   Rec close pcp f/u.  Return precautions provided.          Final Clinical Impression(s) / ED Diagnoses Final diagnoses:  Acute cough  Low grade fever  Acute febrile illness  Bronchitis  Essential hypertension  Elevated blood pressure reading    Rx / DC Orders ED Discharge Orders     None         Cathren Laine, MD 03/12/23 2020

## 2023-03-12 NOTE — ED Triage Notes (Signed)
Pt here from home with c/o cough times 3 days along with a h/a , no fevers , tried some otc allergy meds without relief

## 2023-03-12 NOTE — ED Notes (Signed)
Pt given meds with water, tolerating water fine. Vitals rechecked. Reoriented to call light, denies other needs at this time.

## 2023-03-12 NOTE — Discharge Instructions (Addendum)
It was our pleasure to provide your ER care today - we hope that you feel better.  Drink adequate fluids/stay well hydrated. Take acetaminophen or ibuprofen as need. Take zithromax (antibiotic) as prescribed.   Your blood pressure is high - continue your blood pressure meds, follow heart healthy eating plan, and follow up closely with your doctor in the coming week.  Return to ER if worse, new symptoms, chest pain, increased trouble breathing, or other concern.

## 2023-04-12 NOTE — Progress Notes (Unsigned)
Cardiology Office Note:   Date:  04/13/2023  NAME:  Annajane Niebrugge    MRN: [id number] DOB:  1951-07-11   PCP:  Raymon Mutton., FNP  Cardiologist:  Reatha Harps, MD  Electrophysiologist:  None   Referring MD: Raymon Mutton., FNP   Chief Complaint  Patient presents with   Follow-up         History of Present Illness:   Deztinee Aiken is a 72 y.o. female with a hx of [name], systolic HF, HLD, DM, ESRD who presents for follow-up.  She has not been seen by cardiology since 2022.  Has a history of an ischemic cardiomyopathy.  She has no symptoms of angina.  Reports no chest pain or trouble breathing.  No signs of volume overload.  She is on hemodialysis.  Her EKG shows sinus rhythm with no acute ischemic changes.  She is euvolemic on exam.  She does have faint 2 out of 6 systolic ejection murmur but no edema.  Her diabetes is poorly controlled.  A1c 9.0.  No lipids since 2022 which were not at goal.  We discussed rechecking this.  She is also in need of a repeat echocardiogram.  She reports she walks 1 mile 2 days/week.  Has no symptoms with this.  She apparently will undergo peritoneal dialysis catheter placement in July.  She does say that she would like to be reevaluated for renal transplant.  I did inform her that I would not proceed with peritoneal dialysis catheter placement if transplant is in her future.  Problem List [name] -NSTEMI 06/2021 -PCI to pLAD 2. HLD 3. Ischemic Cardiomyopathy  -EF 35-40% 4. ESRD on HD 5. DM -A1c 9.9  Past Medical History: Past Medical History:  Diagnosis Date   Anemia    Chronic kidney disease    Constipation    Diabetes mellitus    Type II   Dyspnea    with exertion   History of blood transfusion    Hyperlipemia    Hypertension    Pneumonia 2019    Past Surgical History: Past Surgical History:  Procedure Laterality Date   ABDOMINAL AORTOGRAM W/LOWER EXTREMITY N/A 10/15/2022   Procedure: ABDOMINAL AORTOGRAM W/LOWER EXTREMITY;   Surgeon: Victorino Sparrow, MD;  Location: [facility] INVASIVE CV LAB;  Service: Cardiovascular;  Laterality: N/A;   APPENDECTOMY     AV FISTULA PLACEMENT Left 10/05/2019   Procedure: INSERTION OF ARTERIOVENOUS (AV) GORE-TEX VASCULAR GRAFT LEFT ARM;  Surgeon: Larina Earthly, MD;  Location: [facility] OR;  Service: Vascular;  Laterality: Left;   AV FISTULA PLACEMENT Left 02/27/2021   Procedure: LEFT ARM FIRST STAGE BASILIC VEIN  ARTERIOVENOUS (AV) FISTULA CREATION;  Surgeon: Maeola Harman, MD;  Location: [facility] OR;  Service: Vascular;  Laterality: Left;   BASCILIC VEIN TRANSPOSITION Left 04/17/2021   Procedure: LEFT SECOND STAGE BASCILIC VEIN TRANSPOSITION;  Surgeon: Maeola Harman, MD;  Location: [facility] OR;  Service: Vascular;  Laterality: Left;   BIOPSY  07/24/2020   Procedure: BIOPSY;  Surgeon: Sherrilyn Rist, MD;  Location: [facility] ENDOSCOPY;  Service: Gastroenterology;;   BUBBLE STUDY  01/02/2020   Procedure: BUBBLE STUDY;  Surgeon: Parke Poisson, MD;  Location: [facility] ENDOSCOPY;  Service: Cardiology;;   COLONOSCOPY WITH PROPOFOL N/A 07/24/2020   Procedure: COLONOSCOPY WITH PROPOFOL;  Surgeon: Sherrilyn Rist, MD;  Location: [facility] ENDOSCOPY;  Service: Gastroenterology;  Laterality: N/A;   CORONARY STENT INTERVENTION N/A 07/12/2021   Procedure: CORONARY STENT INTERVENTION;  Surgeon: Corky Crafts, MD;  Location: [facility] INVASIVE CV LAB;  Service: Cardiovascular;  Laterality: N/A;   CORONARY ULTRASOUND/IVUS N/A 07/12/2021   Procedure: Intravascular Ultrasound/IVUS;  Surgeon: Corky Crafts, MD;  Location: [facility] INVASIVE CV LAB;  Service: Cardiovascular;  Laterality: N/A;   ESOPHAGOGASTRODUODENOSCOPY (EGD) WITH PROPOFOL N/A 07/24/2020   Procedure: ESOPHAGOGASTRODUODENOSCOPY (EGD) WITH PROPOFOL;  Surgeon: Sherrilyn Rist, MD;  Location: [facility] ENDOSCOPY;  Service: Gastroenterology;  Laterality: N/A;   EYE SURGERY Bilateral    Cataract   FOREIGN BODY REMOVAL Left 05/13/2018   Procedure: FOREIGN BODY  REMOVAL left foot;  Surgeon: Cammy Copa, MD;  Location: [facility] OR;  Service: Orthopedics;  Laterality: Left;   I & D EXTREMITY Left 05/21/2018   Procedure: LEFT FOOT DEBRIDEMENT AND WOUND CLOSURE;  Surgeon: Nadara Mustard, MD;  Location: [facility] OR;  Service: Orthopedics;  Laterality: Left;   LEFT HEART CATH AND CORONARY ANGIOGRAPHY N/A 07/12/2021   Procedure: LEFT HEART CATH AND CORONARY ANGIOGRAPHY;  Surgeon: Corky Crafts, MD;  Location: [facility] INVASIVE CV LAB;  Service: Cardiovascular;  Laterality: N/A;   POLYPECTOMY  07/24/2020   Procedure: POLYPECTOMY;  Surgeon: Sherrilyn Rist, MD;  Location: [facility] ENDOSCOPY;  Service: Gastroenterology;;   TEE WITHOUT CARDIOVERSION N/A 01/02/2020   Procedure: TRANSESOPHAGEAL ECHOCARDIOGRAM (TEE);  Surgeon: Parke Poisson, MD;  Location: [facility] ENDOSCOPY;  Service: Cardiology;  Laterality: N/A;   TONSILLECTOMY     TUBAL LIGATION      Current Medications: Current Meds  Medication Sig   acetaminophen (TYLENOL) 500 MG tablet Take 1,000 mg by mouth every 6 (six) hours as needed for moderate pain or headache.   albuterol (VENTOLIN HFA) 108 (90 Base) MCG/ACT inhaler    amLODipine (NORVASC) 5 MG tablet Take 5 mg by mouth daily.   aspirin 81 MG chewable tablet Chew 1 tablet (81 mg total) by mouth daily.   atorvastatin (LIPITOR) 80 MG tablet Take 1 tablet (80 mg total) by mouth daily. (Patient taking differently: Take 80 mg by mouth at bedtime.)   AURYXIA 1 GM 210 MG(Fe) tablet Take 630 mg by mouth See admin instructions. Take 3tablets (630 mg) by mouth each meal and each snack   calcitRIOL (ROCALTROL) 0.5 MCG capsule Take 5 capsules (2.5 mcg total) by mouth Every Tuesday,Thursday,and Saturday with dialysis.   hydrALAZINE (APRESOLINE) 25 MG tablet Take 1 tablet (25 mg total) by mouth every 8 (eight) hours.   Hyprom-Naphaz-Polysorb-Zn Sulf (CLEAR EYES COMPLETE OP) Place 1 drop into both eyes daily as needed (dry eyes).   Insulin Glargine Solostar (LANTUS) 100  UNIT/ML Solostar Pen SMARTSIG:15 Unit(s) SUB-Q Every Morning   Insulin Pen Needle 32G X 4 MM MISC Use with insulin pen to dispense insulin as directed   isosorbide mononitrate (IMDUR) 30 MG 24 hr tablet Take 1 tablet (30 mg total) by mouth daily.   lubiprostone (AMITIZA) 24 MCG capsule Take 24 mcg by mouth 2 (two) times daily as needed for constipation.   Methoxy PEG-Epoetin Beta (MIRCERA IJ) Mircera   metoCLOPramide (REGLAN) 5 MG tablet Take 5 mg by mouth 3 (three) times daily before meals.   multivitamin (RENA-VIT) TABS tablet Take 1 tablet by mouth daily.   Current Facility-Administered Medications for the 04/13/23 encounter (Office Visit) with Sande Rives, MD  Medication   0.9 %  sodium chloride infusion   sodium chloride flush (NS) 0.9 % injection 3 mL   sodium chloride flush (NS) 0.9 % injection 3 mL  Allergies:    Patient has no known allergies.   Social History: Social History   Socioeconomic History   Marital status: Divorced    Spouse name: Not on file   Number of children: Not on file   Years of education: Not on file   Highest education level: Not on file  Occupational History   Occupation: Retired  Tobacco Use   Smoking status: Former    Years: 4    Types: Cigarettes    Quit date: 01/19/1984    Years since quitting: 39.2    Passive exposure: Never   Smokeless tobacco: Never  Vaping Use   Vaping Use: Never used  Substance and Sexual Activity   Alcohol use: No   Drug use: No   Sexual activity: Not on file  Other Topics Concern   Not on file  Social History Narrative   Lives in [place] with her dtr.  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  Recently moved from disability to "retired" when she turned 80.  Sedentary.  Knits frequently for fun.   Social Determinants of Health   Financial Resource Strain: Not on file  Food Insecurity: No Food Insecurity (10/07/2022)   Hunger Vital Sign    Worried About Running Out of  Food in the Last Year: Never true    Ran Out of Food in the Last Year: Never true  Transportation Needs: No Transportation Needs (10/07/2022)   PRAPARE - Administrator, Civil Service (Medical): No    Lack of Transportation (Non-Medical): No  Physical Activity: Unknown (08/06/2019)   Exercise Vital Sign    Days of Exercise per Week: Patient declined    Minutes of Exercise per Session: Patient declined  Stress: No Stress Concern Present (08/06/2019)   [employer] of Occupational Health - Occupational Stress Questionnaire    Feeling of Stress : Only a little  Social Connections: Unknown (08/06/2019)   Social Connection and Isolation Panel [NHANES]    Frequency of Communication with Friends and Family: Patient declined    Frequency of Social Gatherings with Friends and Family: Patient declined    Attends Religious Services: Patient declined    Database administrator or Organizations: Patient declined    Attends Banker Meetings: Patient declined    Marital Status: Patient declined     Family History: The patient's family history includes Diabetes in her mother; Hypertension in her mother and sister.  ROS:   All other ROS reviewed and negative. Pertinent positives noted in the HPI.     EKGs/Labs/Other Studies Reviewed:   The following studies were personally reviewed by me today:  EKG:  EKG is ordered today.  The ekg ordered today demonstrates normal sinus rhythm heart rate 85, no acute ischemic changes or evidence of infarction, and was personally reviewed by me.   TTE 07/09/2021  1. Global hypokinesis worse in the anteroseptal and mid-apical anterior  myocardium. Left ventricular ejection fraction, by estimation, is 35 to  40%. The left ventricle has moderately decreased function. The left  ventricle demonstrates regional wall  motion abnormalities (see scoring diagram/findings for description). Left  ventricular diastolic parameters are consistent  with Grade I diastolic  dysfunction (impaired relaxation).   2. Right ventricular systolic function is normal. There is mildly  elevated pulmonary artery systolic pressure.   3. The mitral valve is normal in structure. Mild mitral valve  regurgitation. No evidence of mitral stenosis.   4. The  aortic valve is tricuspid. Aortic valve regurgitation is not  visualized. No aortic stenosis is present.   5. The inferior vena cava is dilated in size with <50% respiratory  variability, suggesting right atrial pressure of 15 mmHg.   LHC 07/12/2021   Prox LAD to Mid LAD lesion is 80% stenosed.   A drug-eluting stent was successfully placed using a STENT ONYX FRONTIER 3.0X12, postdilated to 3.7 mm and optimized with intravascular ultrasound.   Post intervention, there is a 0% residual stenosis.   Mid LAD lesion is 75% stenosed.   A drug-eluting stent was successfully placed using a STENT ONYX FRONTIER 2.5X12, postdilated 2.75 mm and optimized with intravascular ultrasound.   Post intervention, there is a 0% residual stenosis.   LV end diastolic pressure is normal.   There is no aortic valve stenosis.  Recent Labs: 10/07/2022: ALT 18 10/10/2022: Platelets 256 10/15/2022: BUN 44; Creatinine, Ser 7.40; Hemoglobin 12.2; Potassium 5.1; Sodium 139   Recent Lipid Panel    Component Value Date/Time   CHOL 209 (H) 07/10/2021 0506   TRIG 120 07/10/2021 0506   TRIG 115 07/10/2021 0506   HDL 41 07/10/2021 0506   CHOLHDL 5.1 07/10/2021 0506   VLDL 23 07/10/2021 0506   LDLCALC 145 (H) 07/10/2021 0506    Physical Exam:   VS:  BP 138/78   Pulse 85   Ht 5\' 4"  (1.626 m)   Wt 157 lb 9.6 oz (71.5 kg)   SpO2 96%   BMI 27.05 kg/m    Wt Readings from Last 3 Encounters:  04/13/23 157 lb 9.6 oz (71.5 kg)  02/25/23 167 lb (75.8 kg)  02/04/23 169 lb (76.7 kg)    General: Well nourished, well developed, in no acute distress Head: Atraumatic, normal size  Eyes: PEERLA, EOMI  Neck: Supple, no  JVD Endocrine: No thryomegaly Cardiac: Normal S1, S2; RRR; no murmurs, rubs, or gallops Lungs: Clear to auscultation bilaterally, no wheezing, rhonchi or rales  Abd: Soft, nontender, no hepatomegaly  Ext: No edema, pulses 2+ Musculoskeletal: No deformities, BUE and BLE strength normal and equal Skin: Warm and dry, no rashes   Neuro: Alert and oriented to person, place, time, and situation, CNII-XII grossly intact, no focal deficits  Psych: Normal mood and affect   ASSESSMENT:   Symara Boccuzzi is a 72 y.o. female who presents for the following: 1. Coronary artery disease involving native coronary artery of native heart without angina pectoris   2. Mixed hyperlipidemia   3. Chronic systolic heart failure (HCC)   4. Preop cardiovascular exam     PLAN:   1. Coronary artery disease involving native coronary artery of native heart without angina pectoris 2. Mixed hyperlipidemia -Non-STEMI 2022.  PCI to proximal to mid LAD.  No symptoms of angina.  Continue aspirin 81 mg daily.  She is on Lipitor 80 mg daily.  No repeat lipids.  She is not fasting today.  She will come back in 3 months after her planned procedure and we will recheck her lipids and reevaluate.  We will plan for aggressive secondary prevention.  She can walk up to 1 mile without any angina.  3. Chronic systolic heart failure (HCC) -Ischemic cardiomyopathy with EF 35-40%.  No reevaluation since 2022.  Current medications include amlodipine 5 mg daily, hydralazine 25 mg 3 times daily, Imdur 30 mg daily.  Not on a beta-blocker.  We will plan to reevaluate her echocardiogram before changing medical therapy.  She actually may have had recovery  of LVEF with revascularization.  We will titrate medical therapy based on her echo.  Volume status is maintained by hemodialysis.  4. Preop cardiovascular exam -She reports she will have peritoneal dialysis catheter placement.  Can walk up to 1 mile without chest pains or trouble breathing.   We will update her echocardiogram as above.  Would not recommend further testing prior to cardiac surgery other than to update her echo.  She actually seems to be doing quite well.  She does informed that she may want to be evaluated for renal transplant.  I did inform her that it may make sense to have this done before she undergoes peritoneal dialysis catheter placement.  I will leave this up to her and her nephrologist.  She will see me back in 3 months to discuss above conditions further.  She may proceed to surgery if she wishes.      Disposition: Return in about 3 months (around 07/14/2023).  Medication Adjustments/Labs and Tests Ordered: Current medicines are reviewed at length with the patient today.  Concerns regarding medicines are outlined above.  Orders Placed This Encounter  Procedures   Lipid panel   EKG 12-Lead   ECHOCARDIOGRAM COMPLETE   No orders of the defined types were placed in this encounter.   Patient Instructions  Medication Instructions:  The current medical regimen is effective;  continue present plan and medications.  *If you need a refill on your cardiac medications before your next appointment, please call your pharmacy*   Lab Work: LIPID (1 week before the follow up in 3 months, no lab appointment needed, come fasting- nothing to eat or drink)   If you have labs (blood work) drawn today and your tests are completely normal, you will receive your results only by: MyChart Message (if you have MyChart) OR A paper copy in the mail If you have any lab test that is abnormal or we need to change your treatment, we will call you to review the results.   Testing/Procedures:  Echocardiogram (ASAP) - Your physician has requested that you have an echocardiogram. Echocardiography is a painless test that uses sound waves to create images of your heart. It provides your doctor with information about the size and shape of your heart and how well your heart's chambers  and valves are working. This procedure takes approximately one hour. There are no restrictions for this procedure.     Follow-Up: At [facility], you and your health needs are our priority.  As part of our continuing mission to provide you with exceptional heart care, we have created designated Provider Care Teams.  These Care Teams include your primary Cardiologist (physician) and Advanced Practice Providers (APPs -  Physician Assistants and Nurse Practitioners) who all work together to provide you with the care you need, when you need it.  We recommend signing up for the patient portal called "MyChart".  Sign up information is provided on this After Visit Summary.  MyChart is used to connect with patients for Virtual Visits (Telemedicine).  Patients are able to view lab/test results, encounter notes, upcoming appointments, etc.  Non-urgent messages can be sent to your provider as well.   To learn more about what you can do with MyChart, go to [email or web address].    Your next appointment:   3 month(s)  Provider:   Reatha Harps, MD        Time Spent with Patient: I have spent a total of 35 minutes with patient reviewing hospital  notes, telemetry, EKGs, labs and examining the patient as well as establishing an assessment and plan that was discussed with the patient.  > 50% of time was spent in direct patient care.  Signed, Lenna Gilford. Flora Lipps, MD, [facility]  [facility]  [address], [address] [place], [place] [address] [phone]  04/13/2023 1:37 PM

## 2023-04-13 ENCOUNTER — Ambulatory Visit: Payer: Medicare HMO | Attending: Cardiovascular Disease | Admitting: Cardiovascular Disease

## 2023-04-13 ENCOUNTER — Encounter: Payer: Self-pay | Admitting: Cardiovascular Disease

## 2023-04-13 VITALS — BP 138/78 | HR 85 | Ht 64.0 in | Wt 157.6 lb

## 2023-04-13 DIAGNOSIS — I2510 Atherosclerotic heart disease of native coronary artery without angina pectoris: Secondary | ICD-10-CM | POA: Diagnosis not present

## 2023-04-13 DIAGNOSIS — I5022 Chronic systolic (congestive) heart failure: Secondary | ICD-10-CM | POA: Diagnosis not present

## 2023-04-13 DIAGNOSIS — E782 Mixed hyperlipidemia: Secondary | ICD-10-CM | POA: Diagnosis not present

## 2023-04-13 DIAGNOSIS — Z01810 Encounter for preprocedural cardiovascular examination: Secondary | ICD-10-CM | POA: Diagnosis not present

## 2023-04-13 NOTE — Patient Instructions (Signed)
Medication Instructions:  The current medical regimen is effective;  continue present plan and medications.  *If you need a refill on your cardiac medications before your next appointment, please call your pharmacy*   Lab Work: LIPID (1 week before the follow up in 3 months, no lab appointment needed, come fasting- nothing to eat or drink)   If you have labs (blood work) drawn today and your tests are completely normal, you will receive your results only by: MyChart Message (if you have MyChart) OR A paper copy in the mail If you have any lab test that is abnormal or we need to change your treatment, we will call you to review the results.   Testing/Procedures:  Echocardiogram (ASAP) - Your physician has requested that you have an echocardiogram. Echocardiography is a painless test that uses sound waves to create images of your heart. It provides your doctor with information about the size and shape of your heart and how well your heart's chambers and valves are working. This procedure takes approximately one hour. There are no restrictions for this procedure.     Follow-Up: At [facility], you and your health needs are our priority.  As part of our continuing mission to provide you with exceptional heart care, we have created designated Provider Care Teams.  These Care Teams include your primary Cardiologist (physician) and Advanced Practice Providers (APPs -  Physician Assistants and Nurse Practitioners) who all work together to provide you with the care you need, when you need it.  We recommend signing up for the patient portal called "MyChart".  Sign up information is provided on this After Visit Summary.  MyChart is used to connect with patients for Virtual Visits (Telemedicine).  Patients are able to view lab/test results, encounter notes, upcoming appointments, etc.  Non-urgent messages can be sent to your provider as well.   To learn more about what you can do with MyChart,  go to [email or web address].    Your next appointment:   3 month(s)  Provider:   Reatha Harps, MD

## 2023-04-15 ENCOUNTER — Ambulatory Visit (INDEPENDENT_AMBULATORY_CARE_PROVIDER_SITE_OTHER): Payer: Medicare HMO

## 2023-04-15 DIAGNOSIS — I5022 Chronic systolic (congestive) heart failure: Secondary | ICD-10-CM

## 2023-04-15 LAB — ECHOCARDIOGRAM COMPLETE
AR max vel: 2.61 cm2
AV Area VTI: 2.48 cm2
AV Area mean vel: 2.47 cm2
AV Mean grad: 5 mmHg
AV Peak grad: 8.6 mmHg
Ao pk vel: 1.47 m/s
Area-P 1/2: 4.17 cm2
S' Lateral: 3.2 cm

## 2023-04-27 ENCOUNTER — Encounter (HOSPITAL_COMMUNITY): Payer: Self-pay | Admitting: Vascular Surgery

## 2023-04-27 ENCOUNTER — Other Ambulatory Visit: Payer: Self-pay

## 2023-04-27 NOTE — Progress Notes (Addendum)
Ms. Iran Blackner denies chest pain or shortness of breath. Patient denies having any s/s of Covid in her household, also denies any known exposure to Covid. Ms Hannasch denies  any s/s of upper or lower respiratory in the past 8 weeks.   Ms Scheerer's PCP is Fatima Sanger, [place], cardiologist is Dr. Lennie Odor.  Ms Mcbean saw Dr. Flora Lipps on 04/13/23, an ECHO was completed on 04/15/23, Dr.O'Neal  cleared patient for surgery.  Ms Stehlin has type II diabetes, patient wears a Dexcom 7 glucose monitor.  Patient reports that CBGs run 110-125.  I instructed Ms. Deblois to take 1/2 dose of Lantus- which is 10 units on the morning of surgery, if CBG is greater than 70.  I instructed patient to check CBG after awaking and every 2 hours until arrival  to the hospital.  I Instructed Ms Strada if CBG is less than 70 to take 4 Glucose Tablets or 1 tube of Glucose Gel or 1/2 cup of a clear juice. Recheck CBG in 15 minutes if CBG is not over 70 call, pre- op desk at [phone] for further instructions. If scheduled to receive Insulin, do not take Insulin.   Ms Liscano was seen 03/12/23 with a cough, she was treated with an antibiotic four 4 days. Ms Fluegge reports that she was symptom free with in 2 weeks.

## 2023-05-01 ENCOUNTER — Encounter (HOSPITAL_COMMUNITY): Payer: Self-pay | Admitting: Vascular Surgery

## 2023-05-01 ENCOUNTER — Ambulatory Visit (HOSPITAL_BASED_OUTPATIENT_CLINIC_OR_DEPARTMENT_OTHER): Payer: Medicare HMO | Admitting: Certified Registered Nurse Anesthetist

## 2023-05-01 ENCOUNTER — Ambulatory Visit (HOSPITAL_COMMUNITY)
Admission: RE | Admit: 2023-05-01 | Discharge: 2023-05-01 | Disposition: A | Discharge status: Home / Self Care | Payer: Medicare HMO | Source: Home / Self Care | Attending: Vascular Surgery | Admitting: Vascular Surgery

## 2023-05-01 ENCOUNTER — Ambulatory Visit (HOSPITAL_COMMUNITY): Payer: Medicare HMO | Admitting: Certified Registered Nurse Anesthetist

## 2023-05-01 ENCOUNTER — Other Ambulatory Visit: Payer: Self-pay

## 2023-05-01 ENCOUNTER — Encounter (HOSPITAL_COMMUNITY)
Admission: RE | Disposition: A | Discharge status: Home / Self Care | Payer: Self-pay | Source: Home / Self Care | Attending: Vascular Surgery

## 2023-05-01 DIAGNOSIS — N186 End stage renal disease: Secondary | ICD-10-CM

## 2023-05-01 DIAGNOSIS — I509 Heart failure, unspecified: Secondary | ICD-10-CM | POA: Insufficient documentation

## 2023-05-01 DIAGNOSIS — N185 Chronic kidney disease, stage 5: Secondary | ICD-10-CM | POA: Diagnosis not present

## 2023-05-01 DIAGNOSIS — I132 Hypertensive heart and chronic kidney disease with heart failure and with stage 5 chronic kidney disease, or end stage renal disease: Secondary | ICD-10-CM

## 2023-05-01 DIAGNOSIS — I252 Old myocardial infarction: Secondary | ICD-10-CM | POA: Diagnosis not present

## 2023-05-01 DIAGNOSIS — Z992 Dependence on renal dialysis: Secondary | ICD-10-CM

## 2023-05-01 DIAGNOSIS — Z87891 Personal history of nicotine dependence: Secondary | ICD-10-CM | POA: Insufficient documentation

## 2023-05-01 DIAGNOSIS — I5022 Chronic systolic (congestive) heart failure: Secondary | ICD-10-CM

## 2023-05-01 DIAGNOSIS — Z794 Long term (current) use of insulin: Secondary | ICD-10-CM | POA: Diagnosis not present

## 2023-05-01 DIAGNOSIS — I2510 Atherosclerotic heart disease of native coronary artery without angina pectoris: Secondary | ICD-10-CM | POA: Insufficient documentation

## 2023-05-01 DIAGNOSIS — E1151 Type 2 diabetes mellitus with diabetic peripheral angiopathy without gangrene: Secondary | ICD-10-CM | POA: Insufficient documentation

## 2023-05-01 DIAGNOSIS — E1122 Type 2 diabetes mellitus with diabetic chronic kidney disease: Secondary | ICD-10-CM | POA: Diagnosis present

## 2023-05-01 HISTORY — DX: Ischemic cardiomyopathy: I25.5

## 2023-05-01 HISTORY — DX: Atherosclerotic heart disease of native coronary artery without angina pectoris: I25.10

## 2023-05-01 HISTORY — PX: CAPD INSERTION: SHX5233

## 2023-05-01 HISTORY — DX: End stage renal disease: N18.6

## 2023-05-01 HISTORY — DX: Heart failure, unspecified: I50.9

## 2023-05-01 HISTORY — DX: Acute myocardial infarction, unspecified: I21.9

## 2023-05-01 LAB — POCT I-STAT, CHEM 8
BUN: 28 mg/dL — ABNORMAL HIGH (ref 8–23)
Calcium, Ion: 1.04 mmol/L — ABNORMAL LOW (ref 1.15–1.40)
Chloride: 99 mmol/L (ref 98–111)
Creatinine, Ser: 5.9 mg/dL — ABNORMAL HIGH (ref 0.44–1.00)
Glucose, Bld: 80 mg/dL (ref 70–99)
HCT: 36 % (ref 36.0–46.0)
Hemoglobin: 12.2 g/dL (ref 12.0–15.0)
Potassium: 3.6 mmol/L (ref 3.5–5.1)
Sodium: 140 mmol/L (ref 135–145)
TCO2: 30 mmol/L (ref 22–32)

## 2023-05-01 LAB — GLUCOSE, CAPILLARY
Glucose-Capillary: 88 mg/dL (ref 70–99)
Glucose-Capillary: 81 mg/dL (ref 70–99)

## 2023-05-01 SURGERY — LAPAROSCOPIC INSERTION CONTINUOUS AMBULATORY PERITONEAL DIALYSIS  (CAPD) CATHETER
Anesthesia: General

## 2023-05-01 MED ORDER — SODIUM CHLORIDE 0.9 % IV SOLN: INTRAVENOUS | Status: DC

## 2023-05-01 MED ORDER — PROMETHAZINE HCL 25 MG/ML IJ SOLN
INTRAMUSCULAR | Status: AC
  Filled 2023-05-01: qty 1

## 2023-05-01 MED ORDER — EPHEDRINE SULFATE-NACL 50-0.9 MG/10ML-% IV SOSY: PREFILLED_SYRINGE | INTRAVENOUS | Status: DC | PRN

## 2023-05-01 MED ORDER — PROPOFOL 10 MG/ML IV BOLUS
INTRAVENOUS | Status: DC | PRN
  Administered 2023-05-01: 120 mg via INTRAVENOUS

## 2023-05-01 MED ORDER — ONDANSETRON HCL 4 MG/2ML IJ SOLN
INTRAMUSCULAR | Status: DC | PRN
  Administered 2023-05-01: 4 mg via INTRAVENOUS

## 2023-05-01 MED ORDER — CEFAZOLIN SODIUM-DEXTROSE 2-4 GM/100ML-% IV SOLN
INTRAVENOUS | Status: AC
  Filled 2023-05-01: qty 100

## 2023-05-01 MED ORDER — ACETAMINOPHEN 500 MG PO TABS
1000.0000 mg | ORAL_TABLET | Freq: Once | ORAL | Status: AC
  Administered 2023-05-01: 1000 mg via ORAL
  Filled 2023-05-01: qty 2

## 2023-05-01 MED ORDER — PHENYLEPHRINE HCL-NACL 20-0.9 MG/250ML-% IV SOLN
INTRAVENOUS | Status: DC | PRN
  Administered 2023-05-01: 50 ug/min via INTRAVENOUS

## 2023-05-01 MED ORDER — CHLORHEXIDINE GLUCONATE 0.12 % MT SOLN: 15.0000 mL | Freq: Once | OROMUCOSAL | Status: AC

## 2023-05-01 MED ORDER — LIDOCAINE 2% (20 MG/ML) 5 ML SYRINGE
INTRAMUSCULAR | Status: DC | PRN
  Administered 2023-05-01: 60 mg via INTRAVENOUS

## 2023-05-01 MED ORDER — PROMETHAZINE HCL 25 MG/ML IJ SOLN
6.2500 mg | INTRAMUSCULAR | Status: DC | PRN
  Administered 2023-05-01: 6.25 mg via INTRAVENOUS

## 2023-05-01 MED ORDER — LACTATED RINGERS IV SOLN: INTRAVENOUS | Status: DC

## 2023-05-01 MED ORDER — CEFAZOLIN SODIUM-DEXTROSE 2-4 GM/100ML-% IV SOLN
2.0000 g | INTRAVENOUS | Status: AC
  Administered 2023-05-01: 2 g via INTRAVENOUS

## 2023-05-01 MED ORDER — CHLORHEXIDINE GLUCONATE 4 % EX SOLN: 60.0000 mL | Freq: Once | CUTANEOUS | Status: DC

## 2023-05-01 MED ORDER — FENTANYL CITRATE (PF) 250 MCG/5ML IJ SOLN
INTRAMUSCULAR | Status: AC
  Filled 2023-05-01: qty 5

## 2023-05-01 MED ORDER — 0.9 % SODIUM CHLORIDE (POUR BTL) OPTIME
TOPICAL | Status: DC | PRN
  Administered 2023-05-01: 1000 mL

## 2023-05-01 MED ORDER — ORAL CARE MOUTH RINSE: 15.0000 mL | Freq: Once | OROMUCOSAL | Status: AC

## 2023-05-01 MED ORDER — DEXAMETHASONE SODIUM PHOSPHATE 10 MG/ML IJ SOLN
INTRAMUSCULAR | Status: DC | PRN
  Administered 2023-05-01: 5 mg via INTRAVENOUS

## 2023-05-01 MED ORDER — PROPOFOL 10 MG/ML IV BOLUS
INTRAVENOUS | Status: AC
  Filled 2023-05-01: qty 20

## 2023-05-01 MED ORDER — FENTANYL CITRATE (PF) 250 MCG/5ML IJ SOLN
INTRAMUSCULAR | Status: DC | PRN
  Administered 2023-05-01: 50 ug via INTRAVENOUS
  Administered 2023-05-01: 100 ug via INTRAVENOUS

## 2023-05-01 MED ORDER — OXYCODONE-ACETAMINOPHEN 5-325 MG PO TABS: 1.0000 | ORAL_TABLET | Freq: Four times a day (QID) | ORAL | 0 refills | Status: DC | PRN

## 2023-05-01 MED ORDER — SODIUM CHLORIDE 0.9 % IR SOLN
Status: DC | PRN
  Administered 2023-05-01: 1

## 2023-05-01 MED ORDER — FENTANYL CITRATE (PF) 100 MCG/2ML IJ SOLN
25.0000 ug | INTRAMUSCULAR | Status: DC | PRN
  Administered 2023-05-01: 50 ug via INTRAVENOUS

## 2023-05-01 MED ORDER — ROCURONIUM BROMIDE 10 MG/ML (PF) SYRINGE
PREFILLED_SYRINGE | INTRAVENOUS | Status: DC | PRN
  Administered 2023-05-01: 40 mg via INTRAVENOUS

## 2023-05-01 MED ORDER — SUGAMMADEX SODIUM 200 MG/2ML IV SOLN
INTRAVENOUS | Status: DC | PRN
  Administered 2023-05-01: 200 mg via INTRAVENOUS

## 2023-05-01 MED ORDER — CHLORHEXIDINE GLUCONATE 0.12 % MT SOLN
OROMUCOSAL | Status: AC
  Administered 2023-05-01: 15 mL via OROMUCOSAL
  Filled 2023-05-01: qty 15

## 2023-05-01 MED ORDER — FENTANYL CITRATE (PF) 100 MCG/2ML IJ SOLN
INTRAMUSCULAR | Status: AC
  Filled 2023-05-01: qty 2

## 2023-05-01 SURGICAL SUPPLY — 60 items
ADAPTER TITANIUM MEDIONICS (MISCELLANEOUS) ×1 IMPLANT
ADH SKN CLS APL DERMABOND .7 (GAUZE/BANDAGES/DRESSINGS) ×1
ADPR DLYS CATH STRL LF DISP (MISCELLANEOUS) ×1
APL PRP STRL LF DISP 70% ISPRP (MISCELLANEOUS) ×1
APPLIER CLIP 5 13 M/L LIGAMAX5 (MISCELLANEOUS)
APR CLP MED LRG 5 ANG JAW (MISCELLANEOUS)
BAG DECANTER FOR FLEXI CONT (MISCELLANEOUS) ×1 IMPLANT
BIOPATCH RED 1 DISK 7.0 (GAUZE/BANDAGES/DRESSINGS) ×1 IMPLANT
BLADE CLIPPER SURG (BLADE) IMPLANT
BLADE SURG 11 STRL SS (BLADE) ×1 IMPLANT
CATH EXTENDED DIALYSIS (CATHETERS) IMPLANT
CHLORAPREP W/TINT 26 (MISCELLANEOUS) ×1 IMPLANT
CLIP APPLIE 5 13 M/L LIGAMAX5 (MISCELLANEOUS) IMPLANT
COVER SURGICAL LIGHT HANDLE (MISCELLANEOUS) ×1 IMPLANT
DERMABOND ADVANCED .7 DNX12 (GAUZE/BANDAGES/DRESSINGS) ×1 IMPLANT
DEVICE TROCAR PUNCTURE CLOSURE (ENDOMECHANICALS) ×1 IMPLANT
DRSG TEGADERM 4X4.75 (GAUZE/BANDAGES/DRESSINGS) ×3 IMPLANT
ELECT REM PT RETURN 9FT ADLT (ELECTROSURGICAL) ×1
ELECTRODE REM PT RTRN 9FT ADLT (ELECTROSURGICAL) ×1 IMPLANT
GAUZE SPONGE 4X4 12PLY STRL (GAUZE/BANDAGES/DRESSINGS) ×1 IMPLANT
GAUZE SPONGE 4X4 12PLY STRL LF (GAUZE/BANDAGES/DRESSINGS) IMPLANT
GLOVE INDICATOR 6.5 STRL GRN (GLOVE) ×1 IMPLANT
GLOVE SURG UNDER LTX SZ7.5 (GLOVE) ×1 IMPLANT
GOWN STRL REUS W/ TWL LRG LVL3 (GOWN DISPOSABLE) ×2 IMPLANT
GOWN STRL REUS W/ TWL XL LVL3 (GOWN DISPOSABLE) ×1 IMPLANT
GOWN STRL REUS W/TWL LRG LVL3 (GOWN DISPOSABLE) ×2
GOWN STRL REUS W/TWL XL LVL3 (GOWN DISPOSABLE) ×1
GRASPER SUT TROCAR 14GX15 (MISCELLANEOUS) ×1 IMPLANT
IRRIG SUCT STRYKERFLOW 2 WTIP (MISCELLANEOUS)
IRRIGATION SUCT STRKRFLW 2 WTP (MISCELLANEOUS) IMPLANT
IV NS 1000ML (IV SOLUTION) ×1
IV NS 1000ML BAXH (IV SOLUTION) ×1 IMPLANT
KIT BASIN OR (CUSTOM PROCEDURE TRAY) ×1 IMPLANT
KIT TURNOVER KIT B (KITS) ×1 IMPLANT
NDL INSUFFLATION 14GA 120MM (NEEDLE) ×1 IMPLANT
NEEDLE INSUFFLATION 14GA 120MM (NEEDLE) ×1 IMPLANT
NS IRRIG 1000ML POUR BTL (IV SOLUTION) ×1 IMPLANT
PAD ARMBOARD 7.5X6 YLW CONV (MISCELLANEOUS) ×2 IMPLANT
POWDER SURGICEL 3.0 GRAM (HEMOSTASIS) IMPLANT
SCISSORS LAP 5X35 DISP (ENDOMECHANICALS) IMPLANT
SET CYSTO W/LG BORE CLAMP LF (SET/KITS/TRAYS/PACK) ×1 IMPLANT
SET EXT 12IN DIALYSIS STAY-SAF (MISCELLANEOUS) ×1 IMPLANT
SET TUBE SMOKE EVAC HIGH FLOW (TUBING) ×1 IMPLANT
SLEEVE Z-THREAD 5X100MM (TROCAR) ×2 IMPLANT
SPIKE FLUID TRANSFER (MISCELLANEOUS) ×1 IMPLANT
STYLET FALLER (MISCELLANEOUS) ×1 IMPLANT
STYLET FALLER MEDIONICS (MISCELLANEOUS) ×1 IMPLANT
SUT MNCRL AB 4-0 PS2 18 (SUTURE) ×1 IMPLANT
SUT PROLENE 0 SH 30 (SUTURE) ×2 IMPLANT
SUT SILK 0 TIES 10X30 (SUTURE) ×1 IMPLANT
SUT VICRYL 3 0 (SUTURE) IMPLANT
TAPE CLOTH SURG 4X10 WHT LF (GAUZE/BANDAGES/DRESSINGS) IMPLANT
TOWEL GREEN STERILE (TOWEL DISPOSABLE) ×1 IMPLANT
TOWEL GREEN STERILE FF (TOWEL DISPOSABLE) ×1 IMPLANT
TRAY LAPAROSCOPIC MC (CUSTOM PROCEDURE TRAY) ×1 IMPLANT
TROCAR 11X100 Z THREAD (TROCAR) IMPLANT
TROCAR 5MMX150MM (TROCAR) ×1 IMPLANT
TROCAR XCEL NON-BLD 5MMX100MML (ENDOMECHANICALS) ×1 IMPLANT
TROCAR Z-THREAD OPTICAL 5X100M (TROCAR) ×1 IMPLANT
WATER STERILE IRR 1000ML POUR (IV SOLUTION) ×1 IMPLANT

## 2023-05-01 NOTE — Anesthesia Preprocedure Evaluation (Signed)
Anesthesia Evaluation  Patient identified by MRN, date of birth, ID band Patient awake    Reviewed: Allergy & Precautions, NPO status , Patient's Chart, lab work & pertinent test results  Airway Mallampati: III  TM Distance: >3 FB Neck ROM: Full    Dental  (+) Dental Advisory Given   Pulmonary former smoker   breath sounds clear to auscultation       Cardiovascular hypertension, Pt. on medications + [name], + Past MI, + Peripheral Vascular Disease and +CHF   Rhythm:Regular Rate:Normal     Neuro/Psych negative neurological ROS     GI/Hepatic negative GI ROS, Neg liver ROS,,,  Endo/Other  diabetes, Insulin Dependent    Renal/GU ESRF and DialysisRenal disease     Musculoskeletal   Abdominal   Peds  Hematology  (+) Blood dyscrasia, anemia   Anesthesia Other Findings   Reproductive/Obstetrics                             Anesthesia Physical Anesthesia Plan  ASA: 4  Anesthesia Plan: General   Post-op Pain Management: Tylenol PO (pre-op)*   Induction: Intravenous  PONV Risk Score and Plan: 3 and Dexamethasone, Ondansetron and Treatment may vary due to age or medical condition  Airway Management Planned: Oral ETT  Additional Equipment: None  Intra-op Plan:   Post-operative Plan: Extubation in OR  Informed Consent: I have reviewed the patients History and Physical, chart, labs and discussed the procedure including the risks, benefits and alternatives for the proposed anesthesia with the patient or authorized representative who has indicated his/her understanding and acceptance.     Dental advisory given  Plan Discussed with: CRNA  Anesthesia Plan Comments:        Anesthesia Quick Evaluation

## 2023-05-01 NOTE — Op Note (Signed)
    Patient name: Melissa Howe MRN: [id number] DOB: November 18, 1950 Sex: female  05/01/2023 Pre-operative Diagnosis: End-stage renal disease Post-operative diagnosis:  Same Surgeon:  Apolinar Junes C. Randie Heinz, MD Assistant: Nathanial Rancher, PA Procedure Performed: Laparoscopic placement of continuous ambulatory peritoneal dialysis catheter  Indications: 72 year old female currently dialyzing via left upper arm AV access now with plans for peritoneal dialysis catheter placement in hopes of home dialysis.  We have discussed the risk benefits and alternatives and she demonstrates good understanding.  Experience assistant was necessary to facilitate camera driving as well as tunneling of the catheter.  Findings: There were minimal intra-abdominal adhesions of the omentum was seen here in 2 areas and omentopexy was not required.  At completion the catheter was used to instill 700 cc of saline briskly and this was all returned quickly as well.   Procedure:  The patient was identified in the holding area and taken to the operating room where she was placed by operative when general anesthesia was induced.  She was sterilely prepped and draped in the abdomen in usual fashion, antibiotics were administered a timeout was called.  Small incision was made 2 cm below the costal margin on the left and a Veress needle was used to gain access into the abdomen and the abdomen was insufflated.  An Optiview trocar was then used in the right upper quadrant and placed in the abdomen with direct visualization.  A 30 degree camera was then used to inspect the abdomen and the Veress needle was identified there was no intra-abdominal injury and the Veress needle was removed.  The patient was then placed in Trendelenburg positioning a second trocar was placed in the right middle abdomen.  The omentum was affected by 2 areas of adhesions and omentopexy was thought not necessary.  The small bowel was retracted cephalad.  The catheter was then  measured out to size and small incision was made next the umbilicus and the catheter was tunneled and placed with the felt cuff intramuscular and the catheter easily rested in the pelvis.  Catheter was then fully assembled and then tunneled to a counterincision on the left subcostal area and then tunneled to an exit site.  This was hooked to saline and the abdomen was desufflated after being inspected she was positioned reverse Trendelenburg.  700 cc of fluid was instilled which was briskly and this was quickly returned with the bag placed on the 4 position.  With this the trocars were removed all incisions were closed with 4-0 Monocryl and Dermabond was placed at the skin level.  Patient was then awakened from anesthesia having tolerated the procedure without any complication.  All counts were correct at completion.   EBL: 20cc   Tesla Bochicchio C. Randie Heinz, MD [facility] Office: [phone] Pager: [phone]

## 2023-05-01 NOTE — H&P (Signed)
HPI:   Melissa Howe is a 72 y.o. female history of end-stage renal disease currently on dialysis via left arm AV fistula which was placed several years ago.  She is now considering peritoneal dialysis so that she can dialyze at home.  She does have a history of an open appendectomy no other previous abdominal surgeries and does not have any ongoing abdominal pain.  She does not take any blood thinners.  She had a previous angiogram for discoloration of her toes on the right which remains somewhat discolored but she does not have any active wounds.       Past Medical History:  Diagnosis Date   Anemia     Chronic kidney disease     Constipation     Diabetes mellitus      Type II   Dyspnea      with exertion   History of blood transfusion     Hyperlipemia     Hypertension     Pneumonia 2019         Family History  Problem Relation Age of Onset   Hypertension Mother     Diabetes Mother     Hypertension Sister           Past Surgical History:  Procedure Laterality Date   ABDOMINAL AORTOGRAM W/LOWER EXTREMITY N/A 10/15/2022    Procedure: ABDOMINAL AORTOGRAM W/LOWER EXTREMITY;  Surgeon: Victorino Sparrow, MD;  Location: [facility] INVASIVE CV LAB;  Service: Cardiovascular;  Laterality: N/A;   APPENDECTOMY       AV FISTULA PLACEMENT Left 10/05/2019    Procedure: INSERTION OF ARTERIOVENOUS (AV) GORE-TEX VASCULAR GRAFT LEFT ARM;  Surgeon: Larina Earthly, MD;  Location: [facility] OR;  Service: Vascular;  Laterality: Left;   AV FISTULA PLACEMENT Left 02/27/2021    Procedure: LEFT ARM FIRST STAGE BASILIC VEIN  ARTERIOVENOUS (AV) FISTULA CREATION;  Surgeon: Maeola Harman, MD;  Location: [facility] OR;  Service: Vascular;  Laterality: Left;   BASCILIC VEIN TRANSPOSITION Left 04/17/2021    Procedure: LEFT SECOND STAGE BASCILIC VEIN TRANSPOSITION;  Surgeon: Maeola Harman, MD;  Location: [facility] OR;  Service: Vascular;  Laterality: Left;   BIOPSY   07/24/2020    Procedure: BIOPSY;  Surgeon:  Sherrilyn Rist, MD;  Location: [facility] ENDOSCOPY;  Service: Gastroenterology;;   BUBBLE STUDY   01/02/2020    Procedure: BUBBLE STUDY;  Surgeon: Parke Poisson, MD;  Location: [facility] ENDOSCOPY;  Service: Cardiology;;   COLONOSCOPY WITH PROPOFOL N/A 07/24/2020    Procedure: COLONOSCOPY WITH PROPOFOL;  Surgeon: Sherrilyn Rist, MD;  Location: [facility] ENDOSCOPY;  Service: Gastroenterology;  Laterality: N/A;   CORONARY STENT INTERVENTION N/A 07/12/2021    Procedure: CORONARY STENT INTERVENTION;  Surgeon: Corky Crafts, MD;  Location: [facility] INVASIVE CV LAB;  Service: Cardiovascular;  Laterality: N/A;   CORONARY ULTRASOUND/IVUS N/A 07/12/2021    Procedure: Intravascular Ultrasound/IVUS;  Surgeon: Corky Crafts, MD;  Location: [facility] INVASIVE CV LAB;  Service: Cardiovascular;  Laterality: N/A;   ESOPHAGOGASTRODUODENOSCOPY (EGD) WITH PROPOFOL N/A 07/24/2020    Procedure: ESOPHAGOGASTRODUODENOSCOPY (EGD) WITH PROPOFOL;  Surgeon: Sherrilyn Rist, MD;  Location: [facility] ENDOSCOPY;  Service: Gastroenterology;  Laterality: N/A;   EYE SURGERY Bilateral      Cataract   FOREIGN BODY REMOVAL Left 05/13/2018    Procedure: FOREIGN BODY REMOVAL left foot;  Surgeon: Cammy Copa, MD;  Location: [facility] OR;  Service: Orthopedics;  Laterality: Left;   I & D EXTREMITY Left  05/21/2018    Procedure: LEFT FOOT DEBRIDEMENT AND WOUND CLOSURE;  Surgeon: Nadara Mustard, MD;  Location: [facility] OR;  Service: Orthopedics;  Laterality: Left;   LEFT HEART CATH AND CORONARY ANGIOGRAPHY N/A 07/12/2021    Procedure: LEFT HEART CATH AND CORONARY ANGIOGRAPHY;  Surgeon: Corky Crafts, MD;  Location: [facility] INVASIVE CV LAB;  Service: Cardiovascular;  Laterality: N/A;   POLYPECTOMY   07/24/2020    Procedure: POLYPECTOMY;  Surgeon: Sherrilyn Rist, MD;  Location: [facility] ENDOSCOPY;  Service: Gastroenterology;;   TEE WITHOUT CARDIOVERSION N/A 01/02/2020    Procedure: TRANSESOPHAGEAL ECHOCARDIOGRAM (TEE);  Surgeon: Parke Poisson, MD;  Location: [facility]  ENDOSCOPY;  Service: Cardiology;  Laterality: N/A;   TONSILLECTOMY       TUBAL LIGATION          Short Social History:  Social History         Tobacco Use   Smoking status: Former      Years: 4      Types: Cigarettes      Quit date: 01/19/1984      Years since quitting: 39.1      Passive exposure: Never   Smokeless tobacco: Never  Substance Use Topics   Alcohol use: No      No Known Allergies         Current Outpatient Medications  Medication Sig Dispense Refill   acetaminophen (TYLENOL) 500 MG tablet Take 1,000 mg by mouth every 6 (six) hours as needed for moderate pain or headache.       albuterol (VENTOLIN HFA) 108 (90 Base) MCG/ACT inhaler         amLODipine (NORVASC) 5 MG tablet Take 5 mg by mouth daily.       aspirin 81 MG chewable tablet Chew 1 tablet (81 mg total) by mouth daily. 30 tablet 1   atorvastatin (LIPITOR) 80 MG tablet Take 1 tablet (80 mg total) by mouth daily. (Patient taking differently: Take 80 mg by mouth at bedtime.) 30 tablet 1   AURYXIA 1 GM 210 MG(Fe) tablet Take 630 mg by mouth See admin instructions. Take 3tablets (630 mg) by mouth each meal and each snack       calcitRIOL (ROCALTROL) 0.5 MCG capsule Take 5 capsules (2.5 mcg total) by mouth Every Tuesday,Thursday,and Saturday with dialysis. 60 capsule 2   hydrALAZINE (APRESOLINE) 25 MG tablet Take 1 tablet (25 mg total) by mouth every 8 (eight) hours. 90 tablet 1   Hyprom-Naphaz-Polysorb-Zn Sulf (CLEAR EYES COMPLETE OP) Place 1 drop into both eyes daily as needed (dry eyes).       Insulin Glargine Solostar (LANTUS) 100 UNIT/ML Solostar Pen SMARTSIG:15 Unit(s) SUB-Q Every Morning       Insulin Pen Needle 32G X 4 MM MISC Use with insulin pen to dispense insulin as directed 100 each 1   isosorbide mononitrate (IMDUR) 30 MG 24 hr tablet Take 1 tablet (30 mg total) by mouth daily. 30 tablet 1   lubiprostone (AMITIZA) 24 MCG capsule Take 24 mcg by mouth 2 (two) times daily as needed for constipation.        Methoxy PEG-Epoetin Beta (MIRCERA IJ) Mircera       metoCLOPramide (REGLAN) 5 MG tablet Take 5 mg by mouth 3 (three) times daily before meals.       multivitamin (RENA-VIT) TABS tablet Take 1 tablet by mouth daily.                 Current Facility-Administered Medications  Medication Dose Route Frequency Provider Last Rate Last Admin   0.9 %  sodium chloride infusion  250 mL Intravenous PRN Victorino Sparrow, MD       sodium chloride flush (NS) 0.9 % injection 3 mL  3 mL Intravenous Q12H Victorino Sparrow, MD       sodium chloride flush (NS) 0.9 % injection 3 mL  3 mL Intravenous PRN Victorino Sparrow, MD          Review of Systems  Constitutional:  Constitutional negative. HENT: HENT negative.  Eyes: Eyes negative.  Respiratory: Respiratory negative.  Cardiovascular: Cardiovascular negative.  GI: Gastrointestinal negative.  Musculoskeletal: Musculoskeletal negative.  Skin: Skin negative.  Neurological: Neurological negative. Hematologic: Hematologic/lymphatic negative.  Psychiatric: Psychiatric negative.          Objective:    Vitals:   05/01/23 0600  BP: (!) 151/63  Pulse: 81  Resp: 16  Temp: 97.8 F (36.6 C)  SpO2: 100%      Physical Exam HENT:     Head: Normocephalic.     Nose: Nose normal.  Eyes:     Pupils: Pupils are equal, round, and reactive to light.  Cardiovascular:     Rate and Rhythm: Normal rate.  Pulmonary:     Effort: Pulmonary effort is normal.  Abdominal:     General: Abdomen is flat.  Musculoskeletal:        General: Normal range of motion.     Cervical back: Normal range of motion.     Right lower leg: No edema.     Left lower leg: No edema.     Comments: Strong thrill left upper arm AV fistula  Skin:    General: Skin is warm.  Neurological:     General: No focal deficit present.     Mental Status: She is alert.  Psychiatric:        Mood and Affect: Mood normal.        Data: No studies      Assessment/Plan:    72 year old  female with end-stage renal disease currently dialyzing via left arm AV fistula with plans for peritoneal dialysis.  I discussed the risk benefits and alternatives to peritoneal dialysis as well as the possible need for future procedures and the risk of infection.  She demonstrates good understanding we will plan for laparoscopic continuous ambulatory peritoneal dialysis catheter placement today.   Huston Stonehocker C. Randie Heinz, MD [facility] [place] Office: [phone] Pager: [phone]

## 2023-05-01 NOTE — Anesthesia Procedure Notes (Signed)
Procedure Name: Intubation Date/Time: 05/01/2023 7:42 AM  Performed by: Dorie Rank, CRNAPre-anesthesia Checklist: Patient identified, Emergency Drugs available, Suction available, Patient being monitored and Timeout performed Patient Re-evaluated:Patient Re-evaluated prior to induction Oxygen Delivery Method: Circle system utilized Preoxygenation: Pre-oxygenation with 100% oxygen Induction Type: IV induction Ventilation: Mask ventilation without difficulty Laryngoscope Size: Mac and 3 Grade View: Grade I Tube type: Oral Tube size: 7.5 mm Number of attempts: 1 Airway Equipment and Method: Stylet Placement Confirmation: ETT inserted through vocal cords under direct vision, positive ETCO2 and breath sounds checked- equal and bilateral Secured at: 22 cm Tube secured with: Tape Dental Injury: Teeth and Oropharynx as per pre-operative assessment

## 2023-05-01 NOTE — Transfer of Care (Signed)
Immediate Anesthesia Transfer of Care Note  Patient: Melissa Howe  Procedure(s) Performed: LAPAROSCOPIC INSERTION CONTINUOUS AMBULATORY PERITONEAL DIALYSIS  (CAPD) CATHETER  Patient Location: PACU  Anesthesia Type:General  Level of Consciousness: awake, alert , and oriented  Airway & Oxygen Therapy: Patient connected to face mask oxygen  Post-op Assessment: Post -op Vital signs reviewed and stable  Post vital signs: stable  Last Vitals:  Vitals Value Taken Time  BP    Temp    Pulse 71 05/01/23 0848  Resp 16 05/01/23 0848  SpO2 96 % 05/01/23 0848  Vitals shown include unvalidated device data.  Last Pain:  Vitals:   05/01/23 0604  TempSrc:   PainSc: 0-No pain         Complications: No notable events documented.

## 2023-05-02 ENCOUNTER — Encounter (HOSPITAL_COMMUNITY): Payer: Self-pay | Admitting: Vascular Surgery

## 2023-05-02 NOTE — Anesthesia Postprocedure Evaluation (Signed)
Anesthesia Post Note  Patient: Ranaya Knoblauch Rossitto  Procedure(s) Performed: LAPAROSCOPIC INSERTION CONTINUOUS AMBULATORY PERITONEAL DIALYSIS  (CAPD) CATHETER     Patient location during evaluation: PACU Anesthesia Type: General Level of consciousness: awake and alert Pain management: pain level controlled Vital Signs Assessment: post-procedure vital signs reviewed and stable Respiratory status: spontaneous breathing, nonlabored ventilation, respiratory function stable and patient connected to nasal cannula oxygen Cardiovascular status: blood pressure returned to baseline and stable Postop Assessment: no apparent nausea or vomiting Anesthetic complications: no  No notable events documented.  Last Vitals:  Vitals:   05/01/23 0915 05/01/23 0924  BP: (!) 135/57   Pulse: 61   Resp: 14   Temp: 36.7 C   SpO2: 97% 93%    Last Pain:  Vitals:   05/01/23 0915  TempSrc:   PainSc: 0-No pain                 Kennieth Rad

## 2023-05-12 ENCOUNTER — Ambulatory Visit: Payer: Medicare HMO | Admitting: Physician Assistant

## 2023-05-12 ENCOUNTER — Ambulatory Visit (HOSPITAL_COMMUNITY)
Admission: RE | Admit: 2023-05-12 | Discharge: 2023-05-12 | Disposition: A | Discharge status: Home / Self Care | Payer: Medicare HMO | Source: Ambulatory Visit | Attending: Vascular Surgery | Admitting: Vascular Surgery

## 2023-05-12 VITALS — BP 214/84 | HR 80 | Temp 98.4°F | Resp 18 | Ht 64.0 in | Wt 168.4 lb

## 2023-05-12 DIAGNOSIS — N186 End stage renal disease: Secondary | ICD-10-CM

## 2023-05-12 DIAGNOSIS — I739 Peripheral vascular disease, unspecified: Secondary | ICD-10-CM | POA: Insufficient documentation

## 2023-05-12 LAB — VAS US ABI WITH/WO TBI

## 2023-05-12 NOTE — Progress Notes (Signed)
Office Note     CC:  follow up Requesting Provider:  Raymon Mutton., FNP  HPI: Melissa Howe is a 72 y.o. (09-Mar-1951) female who presents for follow up of PAD and ESRD. She has previously undergone an angiogram in December of 2023 for some discoloration of her right toes with plantar ulceration on 1st and 2nd toes. No flow limiting stenosis was identified. Single vessel runoff via AT. She had been under the care of Podiatry for her foot wounds. These have since healed.   Today she denies any pain on ambulation or rest. No new wounds on her feet. She says she occasionally gets a sharp shooting pain into her right 2nd toe at night time. This happens very intermittently. She is scheduled to see her Podiatrist to have her nails trimmed next week. She is medically managed on aspirin and statin daily.   We also follow her for a left AV fistula.This is functioning well. She more recently had placement of a PD catheter by Dr. Randie Heinz on 05/01/23. She is not yet using her PD catheter. She says she will have it flushed again tomorrow and hopefully start her training to use it soon.   Past Medical History:  Diagnosis Date   Anemia    CHF (congestive heart failure) (HCC)    Chronic kidney disease    Constipation    Coronary artery disease    Diabetes mellitus    Type II   Dyspnea    with exertion   ESRD (end stage renal disease) (HCC)    History of blood transfusion    Hyperlipemia    Hypertension    Ischemic cardiomyopathy    Myocardial infarction [facility])    Pneumonia 2019    Past Surgical History:  Procedure Laterality Date   ABDOMINAL AORTOGRAM W/LOWER EXTREMITY N/A 10/15/2022   Procedure: ABDOMINAL AORTOGRAM W/LOWER EXTREMITY;  Surgeon: Victorino Sparrow, MD;  Location: [facility] INVASIVE CV LAB;  Service: Cardiovascular;  Laterality: N/A;   APPENDECTOMY     AV FISTULA PLACEMENT Left 10/05/2019   Procedure: INSERTION OF ARTERIOVENOUS (AV) GORE-TEX VASCULAR GRAFT LEFT ARM;  Surgeon: Larina Earthly, MD;  Location: [facility] OR;  Service: Vascular;  Laterality: Left;   AV FISTULA PLACEMENT Left 02/27/2021   Procedure: LEFT ARM FIRST STAGE BASILIC VEIN  ARTERIOVENOUS (AV) FISTULA CREATION;  Surgeon: Maeola Harman, MD;  Location: [facility] OR;  Service: Vascular;  Laterality: Left;   BASCILIC VEIN TRANSPOSITION Left 04/17/2021   Procedure: LEFT SECOND STAGE BASCILIC VEIN TRANSPOSITION;  Surgeon: Maeola Harman, MD;  Location: [facility] OR;  Service: Vascular;  Laterality: Left;   BIOPSY  07/24/2020   Procedure: BIOPSY;  Surgeon: Sherrilyn Rist, MD;  Location: [facility] ENDOSCOPY;  Service: Gastroenterology;;   BUBBLE STUDY  01/02/2020   Procedure: BUBBLE STUDY;  Surgeon: Parke Poisson, MD;  Location: [facility] ENDOSCOPY;  Service: Cardiology;;   CAPD INSERTION N/A 05/01/2023   Procedure: LAPAROSCOPIC INSERTION CONTINUOUS AMBULATORY PERITONEAL DIALYSIS  (CAPD) CATHETER;  Surgeon: Maeola Harman, MD;  Location: [facility] OR;  Service: Vascular;  Laterality: N/A;   CARDIAC CATHETERIZATION     COLONOSCOPY WITH PROPOFOL N/A 07/24/2020   Procedure: COLONOSCOPY WITH PROPOFOL;  Surgeon: Sherrilyn Rist, MD;  Location: [facility] ENDOSCOPY;  Service: Gastroenterology;  Laterality: N/A;   CORONARY STENT INTERVENTION N/A 07/12/2021   Procedure: CORONARY STENT INTERVENTION;  Surgeon: Corky Crafts, MD;  Location: [facility] INVASIVE CV LAB;  Service: Cardiovascular;  Laterality: N/A;  CORONARY ULTRASOUND/IVUS N/A 07/12/2021   Procedure: Intravascular Ultrasound/IVUS;  Surgeon: Corky Crafts, MD;  Location: [facility] INVASIVE CV LAB;  Service: Cardiovascular;  Laterality: N/A;   ESOPHAGOGASTRODUODENOSCOPY (EGD) WITH PROPOFOL N/A 07/24/2020   Procedure: ESOPHAGOGASTRODUODENOSCOPY (EGD) WITH PROPOFOL;  Surgeon: Sherrilyn Rist, MD;  Location: [facility] ENDOSCOPY;  Service: Gastroenterology;  Laterality: N/A;   EYE SURGERY Bilateral    Cataract   FOREIGN BODY REMOVAL Left 05/13/2018   Procedure: FOREIGN BODY  REMOVAL left foot;  Surgeon: Cammy Copa, MD;  Location: [facility] OR;  Service: Orthopedics;  Laterality: Left;   I & D EXTREMITY Left 05/21/2018   Procedure: LEFT FOOT DEBRIDEMENT AND WOUND CLOSURE;  Surgeon: Nadara Mustard, MD;  Location: [facility] OR;  Service: Orthopedics;  Laterality: Left;   LEFT HEART CATH AND CORONARY ANGIOGRAPHY N/A 07/12/2021   Procedure: LEFT HEART CATH AND CORONARY ANGIOGRAPHY;  Surgeon: Corky Crafts, MD;  Location: [facility] INVASIVE CV LAB;  Service: Cardiovascular;  Laterality: N/A;   POLYPECTOMY  07/24/2020   Procedure: POLYPECTOMY;  Surgeon: Sherrilyn Rist, MD;  Location: [facility] ENDOSCOPY;  Service: Gastroenterology;;   TEE WITHOUT CARDIOVERSION N/A 01/02/2020   Procedure: TRANSESOPHAGEAL ECHOCARDIOGRAM (TEE);  Surgeon: Parke Poisson, MD;  Location: [facility] ENDOSCOPY;  Service: Cardiology;  Laterality: N/A;   TONSILLECTOMY     TUBAL LIGATION      Social History   Socioeconomic History   Marital status: Divorced    Spouse name: Not on file   Number of children: Not on file   Years of education: Not on file   Highest education level: Not on file  Occupational History   Occupation: Retired  Tobacco Use   Smoking status: Former    Current packs/day: 0.00    Types: Cigarettes    Start date: 01/19/1980    Quit date: 01/19/1984    Years since quitting: 39.3    Passive exposure: Never   Smokeless tobacco: Never  Vaping Use   Vaping status: Never Used  Substance and Sexual Activity   Alcohol use: No   Drug use: No   Sexual activity: Not on file  Other Topics Concern   Not on file  Social History Narrative   Lives in [place] with her dtr.  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  Recently moved from disability to "retired" when she turned 66.  Sedentary.  Knits frequently for fun.   Social Determinants of Health   Financial Resource Strain: Not on file  Food Insecurity: No Food Insecurity (10/07/2022)   Hunger Vital Sign     Worried About Running Out of Food in the Last Year: Never true    Ran Out of Food in the Last Year: Never true  Transportation Needs: No Transportation Needs (10/07/2022)   PRAPARE - Administrator, Civil Service (Medical): No    Lack of Transportation (Non-Medical): No  Physical Activity: Unknown (08/06/2019)   Exercise Vital Sign    Days of Exercise per Week: Patient declined    Minutes of Exercise per Session: Patient declined  Stress: No Stress Concern Present (08/06/2019)   [employer] of Occupational Health - Occupational Stress Questionnaire    Feeling of Stress : Only a little  Social Connections: Unknown (12/15/2022)   Received from [facility]   Social Network    Social Network: Not on file  Intimate Partner Violence: Unknown (12/15/2022)   Received from [facility]   HITS  Physically Hurt: Not on file    Insult or Talk Down To: Not on file    Threaten Physical Harm: Not on file    Scream or Curse: Not on file    Family History  Problem Relation Age of Onset   Hypertension Mother    Diabetes Mother    Hypertension Sister     Current Outpatient Medications  Medication Sig Dispense Refill   amLODipine (NORVASC) 10 MG tablet Take 10 mg by mouth daily.     aspirin 81 MG chewable tablet Chew 1 tablet (81 mg total) by mouth daily. 30 tablet 1   atorvastatin (LIPITOR) 80 MG tablet Take 1 tablet (80 mg total) by mouth daily. (Patient taking differently: Take 80 mg by mouth at bedtime.) 30 tablet 1   calcitRIOL (ROCALTROL) 0.5 MCG capsule Take 5 capsules (2.5 mcg total) by mouth Every Tuesday,Thursday,and Saturday with dialysis. 60 capsule 2   cinacalcet (SENSIPAR) 30 MG tablet Take 30 mg by mouth daily. With snack     hydrALAZINE (APRESOLINE) 25 MG tablet Take 1 tablet (25 mg total) by mouth every 8 (eight) hours. (Patient taking differently: Take 25 mg by mouth 2 (two) times daily with a meal.) 90 tablet 1   Hyprom-Naphaz-Polysorb-Zn Sulf (CLEAR  EYES COMPLETE OP) Place 1 drop into both eyes daily as needed (dry eyes).     Insulin Glargine Solostar (LANTUS) 100 UNIT/ML Solostar Pen Inject 20 Units as directed daily.     Insulin Pen Needle 32G X 4 MM MISC Use with insulin pen to dispense insulin as directed 100 each 1   isosorbide mononitrate (IMDUR) 30 MG 24 hr tablet Take 1 tablet (30 mg total) by mouth daily. 30 tablet 1   Methoxy PEG-Epoetin Beta (MIRCERA IJ) Mircera     metoCLOPramide (REGLAN) 5 MG tablet Take 5 mg by mouth Every Tuesday,Thursday,and Saturday with dialysis.     oxyCODONE-acetaminophen (PERCOCET) 5-325 MG tablet Take 1 tablet by mouth every 6 (six) hours as needed for severe pain. 20 tablet 0   sucroferric oxyhydroxide (VELPHORO) 500 MG chewable tablet Chew 1,000-1,500 mg by mouth See admin instructions. Take 1500 mg with each meal and 1000 mg with snacks     triamcinolone lotion (KENALOG) 0.1 % Apply 1 Application topically 2 (two) times daily.     Current Facility-Administered Medications  Medication Dose Route Frequency Provider Last Rate Last Admin   0.9 %  sodium chloride infusion  250 mL Intravenous PRN Victorino Sparrow, MD       sodium chloride flush (NS) 0.9 % injection 3 mL  3 mL Intravenous Q12H Victorino Sparrow, MD       sodium chloride flush (NS) 0.9 % injection 3 mL  3 mL Intravenous PRN Victorino Sparrow, MD        No Known Allergies   REVIEW OF SYSTEMS:   [X]  denotes positive finding, [ ]  denotes negative finding Cardiac  Comments:  Chest pain or chest pressure:    Shortness of breath upon exertion:    Short of breath when lying flat:    Irregular heart rhythm:        Vascular    Pain in calf, thigh, or hip brought on by ambulation:    Pain in feet at night that wakes you up from your sleep:     Blood clot in your veins:    Leg swelling:         Pulmonary    Oxygen at home:  Productive cough:     Wheezing:         Neurologic    Sudden weakness in arms or legs:     Sudden numbness  in arms or legs:     Sudden onset of difficulty speaking or slurred speech:    Temporary loss of vision in one eye:     Problems with dizziness:         Gastrointestinal    Blood in stool:     Vomited blood:         Genitourinary    Burning when urinating:     Blood in urine:        Psychiatric    Major depression:         Hematologic    Bleeding problems:    Problems with blood clotting too easily:        Skin    Rashes or ulcers:        Constitutional    Fever or chills:      PHYSICAL EXAMINATION:  Vitals:   05/12/23 1246  BP: (!) 214/84  Pulse: 80  Resp: 18  Temp: 98.4 F (36.9 C)  TempSrc: Temporal  SpO2: 98%  Weight: 168 lb 6.4 oz (76.4 kg)  Height: 5\' 4"  (1.626 m)    General:  WDWN in NAD; vital signs documented above Gait: Normal HENT: WNL, normocephalic Pulmonary: normal non-labored breathing , without wheezing Cardiac: regular HR Abdomen: soft, NT, no masses. Laparoscopic incisions healing well, PD catheter site intact and well appearing. Vascular Exam/Pulses: left AV fistula with good thrill Extremities: without ischemic changes, without Gangrene , without cellulitis; without open wounds; 2+ popliteal and DP pulses palpable Musculoskeletal: no muscle wasting or atrophy  Neurologic: A&O X 3 Psychiatric:  The pt has Normal affect.   Non-Invasive Vascular Imaging:    +-------+-----------+-----------+------------+------------+  ABI/TBIToday's ABIToday's TBIPrevious ABIPrevious TBI  +-------+-----------+-----------+------------+------------+  Right [place]         0.70       [place]                        +-------+-----------+-----------+------------+------------+  Left  [place]         0.53       [place]          0.46          +-------+-----------+-----------+------------+------------+  Right great toe pressure: 149 mmHg Left great toe pressure: 112 mmHg  Biphasic flow bilaterally   ASSESSMENT/PLAN:: 72 y.o. female here for follow up for PAD  and ESRD. She has previously undergone an angiogram in December of 2023 for some discoloration of her right toes with plantar ulceration on 1st and 2nd toes. No flow limiting stenosis was identified. Single vessel runoff via AT. She is currently without any claudication, rest pain or tissue loss. Her ABI's today are stable. She has a functioning left upper arm fistula. She also recently had a PD catheter placed. Her incisions are healing well. She has not yet started using her PD catheter.  - Encourage walking regimen - Discussed importance of keeping her feet protected - Continue Aspirin and Statin - She will follow up in 6 months with repeat ABI - She knows to call sooner for any issues with fistula, PD catheter or lower extremities    Graceann Congress, PA-C [facility] [phone]  Clinic MD:  Steve Rattler

## 2023-05-20 ENCOUNTER — Other Ambulatory Visit: Payer: Self-pay

## 2023-05-20 ENCOUNTER — Ambulatory Visit: Payer: Medicare HMO | Admitting: Podiatry

## 2023-05-20 DIAGNOSIS — I739 Peripheral vascular disease, unspecified: Secondary | ICD-10-CM

## 2023-05-22 ENCOUNTER — Ambulatory Visit (INDEPENDENT_AMBULATORY_CARE_PROVIDER_SITE_OTHER): Payer: Medicare HMO | Admitting: Podiatry

## 2023-05-22 DIAGNOSIS — B351 Tinea unguium: Secondary | ICD-10-CM | POA: Diagnosis not present

## 2023-05-22 DIAGNOSIS — M79675 Pain in left toe(s): Secondary | ICD-10-CM | POA: Diagnosis not present

## 2023-05-22 DIAGNOSIS — M79674 Pain in right toe(s): Secondary | ICD-10-CM

## 2023-05-22 NOTE — Progress Notes (Signed)
  Subjective:  Patient ID: Melissa Howe, female    DOB: 19-Apr-1951,  MRN: [id number]  Chief Complaint  Patient presents with   Nail Problem    Diabetic foot care, nail trim, A1c- 9.9   72 y.o. female returns for the above complaint.  Patient presents with thickened elongated dystrophic mycotic toenails x 10 mild pain on palpation hurts with ambulation is with pressure  Objective:  There were no vitals filed for this visit. Podiatric Exam: Vascular: dorsalis pedis and posterior tibial pulses are palpable bilateral. Capillary return is immediate. Temperature gradient is WNL. Skin turgor WNL  Sensorium: Normal Semmes Weinstein monofilament test. Normal tactile sensation bilaterally. Nail Exam: Pt has thick disfigured discolored nails with subungual debris noted bilateral entire nail hallux through fifth toenails.  Pain on palpation to the nails. Ulcer Exam: There is no evidence of ulcer or pre-ulcerative changes or infection. Orthopedic Exam: Muscle tone and strength are WNL. No limitations in general ROM. No crepitus or effusions noted.  Skin: No Porokeratosis. No infection or ulcers    Assessment & Plan:   1. Pain due to onychomycosis of toenails of both feet     Patient was evaluated and treated and all questions answered.  Onychomycosis with pain  -Nails palliatively debrided as below. -Educated on self-care  Procedure: Nail Debridement Rationale: pain  Type of Debridement: manual, sharp debridement. Instrumentation: Nail nipper, rotary burr. Number of Nails: 10  Procedures and Treatment: Consent by patient was obtained for treatment procedures. The patient understood the discussion of treatment and procedures well. All questions were answered thoroughly reviewed. Debridement of mycotic and hypertrophic toenails, 1 through 5 bilateral and clearing of subungual debris. No ulceration, no infection noted.  Return Visit-Office Procedure: Patient instructed to return to the  office for a follow up visit 3 months for continued evaluation and treatment.  Nicholes Rough, DPM    No follow-ups on file.

## 2023-06-09 ENCOUNTER — Emergency Department (HOSPITAL_BASED_OUTPATIENT_CLINIC_OR_DEPARTMENT_OTHER): Payer: Medicare HMO

## 2023-06-09 ENCOUNTER — Inpatient Hospital Stay (HOSPITAL_BASED_OUTPATIENT_CLINIC_OR_DEPARTMENT_OTHER)
Admission: EM | Admit: 2023-06-09 | Discharge: 2023-06-13 | DRG: 640 | Disposition: A | Discharge status: Home / Self Care | Payer: Medicare HMO | Attending: Internal Medicine | Admitting: Internal Medicine

## 2023-06-09 DIAGNOSIS — E1122 Type 2 diabetes mellitus with diabetic chronic kidney disease: Secondary | ICD-10-CM | POA: Diagnosis present

## 2023-06-09 DIAGNOSIS — E8779 Other fluid overload: Secondary | ICD-10-CM

## 2023-06-09 DIAGNOSIS — I132 Hypertensive heart and chronic kidney disease with heart failure and with stage 5 chronic kidney disease, or end stage renal disease: Secondary | ICD-10-CM | POA: Diagnosis present

## 2023-06-09 DIAGNOSIS — I10 Essential (primary) hypertension: Secondary | ICD-10-CM | POA: Diagnosis present

## 2023-06-09 DIAGNOSIS — E440 Moderate protein-calorie malnutrition: Secondary | ICD-10-CM | POA: Diagnosis present

## 2023-06-09 DIAGNOSIS — E8770 Fluid overload, unspecified: Secondary | ICD-10-CM | POA: Diagnosis not present

## 2023-06-09 DIAGNOSIS — J810 Acute pulmonary edema: Secondary | ICD-10-CM | POA: Diagnosis not present

## 2023-06-09 DIAGNOSIS — F32A Depression, unspecified: Secondary | ICD-10-CM | POA: Diagnosis present

## 2023-06-09 DIAGNOSIS — I5032 Chronic diastolic (congestive) heart failure: Secondary | ICD-10-CM | POA: Diagnosis present

## 2023-06-09 DIAGNOSIS — J9601 Acute respiratory failure with hypoxia: Secondary | ICD-10-CM | POA: Diagnosis present

## 2023-06-09 DIAGNOSIS — Z794 Long term (current) use of insulin: Secondary | ICD-10-CM

## 2023-06-09 DIAGNOSIS — Z955 Presence of coronary angioplasty implant and graft: Secondary | ICD-10-CM

## 2023-06-09 DIAGNOSIS — Z833 Family history of diabetes mellitus: Secondary | ICD-10-CM

## 2023-06-09 DIAGNOSIS — Z7982 Long term (current) use of aspirin: Secondary | ICD-10-CM

## 2023-06-09 DIAGNOSIS — N186 End stage renal disease: Secondary | ICD-10-CM | POA: Diagnosis present

## 2023-06-09 DIAGNOSIS — E785 Hyperlipidemia, unspecified: Secondary | ICD-10-CM | POA: Diagnosis present

## 2023-06-09 DIAGNOSIS — N289 Disorder of kidney and ureter, unspecified: Secondary | ICD-10-CM

## 2023-06-09 DIAGNOSIS — I25810 Atherosclerosis of coronary artery bypass graft(s) without angina pectoris: Secondary | ICD-10-CM | POA: Diagnosis present

## 2023-06-09 DIAGNOSIS — K5900 Constipation, unspecified: Secondary | ICD-10-CM | POA: Diagnosis present

## 2023-06-09 DIAGNOSIS — E876 Hypokalemia: Secondary | ICD-10-CM | POA: Diagnosis present

## 2023-06-09 DIAGNOSIS — I2510 Atherosclerotic heart disease of native coronary artery without angina pectoris: Secondary | ICD-10-CM | POA: Diagnosis present

## 2023-06-09 DIAGNOSIS — E1165 Type 2 diabetes mellitus with hyperglycemia: Secondary | ICD-10-CM | POA: Diagnosis present

## 2023-06-09 DIAGNOSIS — Z79899 Other long term (current) drug therapy: Secondary | ICD-10-CM

## 2023-06-09 DIAGNOSIS — I5023 Acute on chronic systolic (congestive) heart failure: Secondary | ICD-10-CM | POA: Diagnosis present

## 2023-06-09 DIAGNOSIS — Z87891 Personal history of nicotine dependence: Secondary | ICD-10-CM

## 2023-06-09 DIAGNOSIS — Z8249 Family history of ischemic heart disease and other diseases of the circulatory system: Secondary | ICD-10-CM

## 2023-06-09 DIAGNOSIS — Z992 Dependence on renal dialysis: Secondary | ICD-10-CM

## 2023-06-09 DIAGNOSIS — I252 Old myocardial infarction: Secondary | ICD-10-CM

## 2023-06-09 DIAGNOSIS — I255 Ischemic cardiomyopathy: Secondary | ICD-10-CM | POA: Diagnosis present

## 2023-06-09 DIAGNOSIS — D631 Anemia in chronic kidney disease: Secondary | ICD-10-CM | POA: Diagnosis present

## 2023-06-09 DIAGNOSIS — N2581 Secondary hyperparathyroidism of renal origin: Secondary | ICD-10-CM | POA: Diagnosis present

## 2023-06-09 LAB — BASIC METABOLIC PANEL
Anion gap: 13 (ref 5–15)
BUN: 55 mg/dL — ABNORMAL HIGH (ref 8–23)
CO2: 30 mmol/L (ref 22–32)
Calcium: 8.6 mg/dL — ABNORMAL LOW (ref 8.9–10.3)
Chloride: 99 mmol/L (ref 98–111)
Creatinine, Ser: 10.55 mg/dL — ABNORMAL HIGH (ref 0.44–1.00)
GFR, Estimated: 4 mL/min — ABNORMAL LOW (ref >60)
Glucose, Bld: 75 mg/dL (ref 70–99)
Potassium: 3.6 mmol/L (ref 3.5–5.1)
Sodium: 142 mmol/L (ref 135–145)

## 2023-06-09 LAB — CBC
HCT: 31.9 % — ABNORMAL LOW (ref 36.0–46.0)
Hemoglobin: 10.3 g/dL — ABNORMAL LOW (ref 12.0–15.0)
MCH: 29.9 pg (ref 26.0–34.0)
MCHC: 32.3 g/dL (ref 30.0–36.0)
MCV: 92.5 fL (ref 80.0–100.0)
Platelets: 233 10*3/uL (ref 150–400)
RBC: 3.45 MIL/uL — ABNORMAL LOW (ref 3.87–5.11)
RDW: 15.3 % (ref 11.5–15.5)
WBC: 9.6 10*3/uL (ref 4.0–10.5)
nRBC: 0 % (ref 0.0–0.2)

## 2023-06-09 LAB — CBG MONITORING, ED
Glucose-Capillary: 60 mg/dL — ABNORMAL LOW (ref 70–99)
Glucose-Capillary: 65 mg/dL — ABNORMAL LOW (ref 70–99)

## 2023-06-09 NOTE — ED Notes (Signed)
Pt drinking another orange juice

## 2023-06-09 NOTE — ED Provider Notes (Signed)
[place] EMERGENCY DEPARTMENT AT [facility] Provider Note   CSN: [id number] Arrival date & time: 06/09/23  2046     History  No chief complaint on file.   Melissa Howe is a 72 y.o. female.  Patient is a 72 year old female with a history of hypertension, hyperlipidemia, diabetes, end-stage renal disease on peritoneal dialysis at home, ischemic cardiomyopathy who is presenting today with worsening leg swelling over the last few weeks and worsening shortness of breath.  Patient reports she is doing her peritoneal dialysis as they have instructed which she just recently switched from hemodialysis to peritoneal dialysis a month ago because she reported going there made her so depressed.  She has been doing the peritoneal dialysis multiple times per day and seem to be doing well at home until a few weeks ago.  Patient is not sure how much weight she is again but complains of significant swelling in her legs.  She has orthopnea and shortness of breath with even short walks.  She denies any chest pain, resting shortness of breath, abdominal pain.  She did talk with the dialysis people when this first started and they thought she might be constipated and gave told her to take a laxative but she reports it did not help.  She has not had fever or cough.        Home Medications Prior to Admission medications   Medication Sig Start Date End Date Taking? Authorizing Provider  amLODipine (NORVASC) 10 MG tablet Take 10 mg by mouth daily.    [provider]  aspirin 81 MG chewable tablet Chew 1 tablet (81 mg total) by mouth daily. 07/13/21   Ghimire, Werner Lean, MD  atorvastatin (LIPITOR) 80 MG tablet Take 1 tablet (80 mg total) by mouth daily. Patient taking differently: Take 80 mg by mouth at bedtime. 07/13/21   Ghimire, Werner Lean, MD  calcitRIOL (ROCALTROL) 0.5 MCG capsule Take 5 capsules (2.5 mcg total) by mouth Every Tuesday,Thursday,and Saturday with dialysis. 07/24/20    Pokhrel, Rebekah Chesterfield, MD  cinacalcet (SENSIPAR) 30 MG tablet Take 30 mg by mouth daily. With snack    [provider]  hydrALAZINE (APRESOLINE) 25 MG tablet Take 1 tablet (25 mg total) by mouth every 8 (eight) hours. Patient taking differently: Take 25 mg by mouth 2 (two) times daily with a meal. 07/13/21   Ghimire, Werner Lean, MD  Hyprom-Naphaz-Polysorb-Zn Sulf (CLEAR EYES COMPLETE OP) Place 1 drop into both eyes daily as needed (dry eyes).    [provider]  Insulin Glargine Solostar (LANTUS) 100 UNIT/ML Solostar Pen Inject 20 Units as directed daily. 11/11/22   [provider]  Insulin Pen Needle 32G X 4 MM MISC Use with insulin pen to dispense insulin as directed 09/08/16   Tat, Onalee Hua, MD  isosorbide mononitrate (IMDUR) 30 MG 24 hr tablet Take 1 tablet (30 mg total) by mouth daily. 07/13/21   Ghimire, Werner Lean, MD  Methoxy PEG-Epoetin Beta (MIRCERA IJ) Mircera 12/11/22 12/10/23  [provider]  metoCLOPramide (REGLAN) 5 MG tablet Take 5 mg by mouth Every Tuesday,Thursday,and Saturday with dialysis. 09/19/22   [provider]  oxyCODONE-acetaminophen (PERCOCET) 5-325 MG tablet Take 1 tablet by mouth every 6 (six) hours as needed for severe pain. 05/01/23 04/30/24  Baglia, Corrina, PA-C  sucroferric oxyhydroxide (VELPHORO) 500 MG chewable tablet Chew 1,000-1,500 mg by mouth See admin instructions. Take 1500 mg with each meal and 1000 mg with snacks    [provider]  triamcinolone lotion (  KENALOG) 0.1 % Apply 1 Application topically 2 (two) times daily.    [provider]      Allergies    Patient has no known allergies.    Review of Systems   Review of Systems  Physical Exam Updated Vital Signs BP (!) 186/78   Pulse 70   Temp 98 F (36.7 C)   Resp 15   SpO2 100%  Physical Exam Vitals and nursing note reviewed.  Constitutional:      General: She is not in acute distress.    Appearance: She is well-developed.  HENT:     Head:  Normocephalic and atraumatic.  Eyes:     Conjunctiva/sclera: Conjunctivae normal.     Pupils: Pupils are equal, round, and reactive to light.  Neck:     Comments: JVD while sitting upright Cardiovascular:     Rate and Rhythm: Regular rhythm. Tachycardia present.     Heart sounds: No murmur heard. Pulmonary:     Effort: Pulmonary effort is normal. Tachypnea present. No respiratory distress.     Breath sounds: Rales present. No wheezing.  Abdominal:     General: There is distension.     Palpations: Abdomen is soft.     Tenderness: There is no abdominal tenderness. There is no guarding or rebound.     Comments: Peritoneal tubing intact without surrounding erythema or drainage  Musculoskeletal:        General: No tenderness. Normal range of motion.     Cervical back: Normal range of motion and neck supple.     Right lower leg: Edema present.     Left lower leg: Edema present.     Comments: Significant edema to her thighs bilaterally.  Legs are not weeping at this time  Skin:    General: Skin is warm and dry.     Findings: No erythema or rash.  Neurological:     Mental Status: She is alert and oriented to person, place, and time.  Psychiatric:        Behavior: Behavior normal.     ED Results / Procedures / Treatments   Labs (all labs ordered are listed, but only abnormal results are displayed) Labs Reviewed  CBC - Abnormal; Notable for the following components:      Result Value   RBC 3.45 (*)    Hemoglobin 10.3 (*)    HCT 31.9 (*)    All other components within normal limits  BASIC METABOLIC PANEL - Abnormal; Notable for the following components:   BUN 55 (*)    Creatinine, Ser 10.55 (*)    Calcium 8.6 (*)    GFR, Estimated 4 (*)    All other components within normal limits  CBG MONITORING, ED - Abnormal; Notable for the following components:   Glucose-Capillary 60 (*)    All other components within normal limits  CBG MONITORING, ED - Abnormal; Notable for the  following components:   Glucose-Capillary 65 (*)    All other components within normal limits    EKG EKG Interpretation Date/Time:  Tuesday June 09 2023 20:59:45 EDT Ventricular Rate:  93 PR Interval:  182 QRS Duration:  76 QT Interval:  338 QTC Calculation: 420 R Axis:   25  Text Interpretation: Sinus rhythm with Premature atrial complexes with Abberant conduction Cannot rule out Anterior infarct , age undetermined When compared with ECG of 15-Oct-2022 12:22, Abberant conduction is now Present Confirmed by Gwyneth Sprout ([id number]) on 06/09/2023 9:58:20 PM  Radiology DG Chest [facility]  1 View  Result Date: 06/09/2023 CLINICAL DATA:  Shortness of breath, bilateral swollen legs and feet. EXAM: PORTABLE CHEST 1 VIEW COMPARISON:  03/12/2023. FINDINGS: Heart is enlarged and the mediastinal contour is within normal limits. There is atherosclerotic calcification of the aorta. The pulmonary vasculature is mildly distended. Mild airspace disease is noted at the lung bases bilaterally. There is blunting of the costophrenic angles bilaterally, possible small pleural effusions. No pneumothorax. No acute osseous abnormality. IMPRESSION: 1. Cardiomegaly with mildly distended pulmonary vasculature. 2. Mild airspace disease at the lung bases, possible atelectasis, edema, or infiltrate. 3. Suspected small bilateral pleural effusions. Electronically Signed   By: Thornell Sartorius M.D.   On: 06/09/2023 22:34    Procedures Procedures    Medications Ordered in ED Medications - No data to display  ED Course/ Medical Decision Making/ A&P                                 Medical Decision Making Amount and/or Complexity of Data Reviewed External Data Reviewed: notes. Labs: ordered. Decision-making details documented in ED Course. Radiology: ordered and independent interpretation performed. Decision-making details documented in ED Course. ECG/medicine tests: ordered and independent interpretation performed.  Decision-making details documented in ED Course.  Risk Decision regarding hospitalization.   Pt with multiple medical problems and comorbidities and presenting today with a complaint that caries a high risk for morbidity and mortality.  Here today with evidence of significant fluid overload and concern for CHF in the setting of recently switching from hemodialysis to peritoneal dialysis.  Patient has been doing the dialysis as instructed and reports that earlier today she put in 2400 and pulled off 3100 but has not weighed herself to know how much weight she has put on in the last few weeks.  Patient has significant JVD, rales, distal edema.  Concern that she will need hemodialysis to get some of the fluid off as she makes very little urine and does not feel like the Lasix will be helpful.  Low suspicion for infectious etiology at this time and patient is denying any chest pain.  I independently interpreted patient's EKG and labs.  EKG with no ST changes concerning for STEMI.  BMP with findings consistent with end-stage renal disease.  Initially upon arrival patient's blood sugar was 60 but she did eat something and blood sugar has increased and she has continuous monitoring in the room and last blood sugar was in the 80s.  CBC without acute changes.  I have independently visualized and interpreted pt's images today. Chest x-ray with evidence of cardiomegaly and pulmonary edema.  Consulted nephrology and spoke with Dr. Malen Gauze who agrees that patient needs to be admitted and they will consult on the patient as soon as she arrives at [facility].  Will admit to the hospitalist service.  All these findings were discussed with the patient and her family.  They understand the plan.         Final Clinical Impression(s) / ED Diagnoses Final diagnoses:  ESRD (end stage renal disease) (HCC)  Acute pulmonary edema (HCC)  Other hypervolemia    Rx / DC Orders ED Discharge Orders     None          Gwyneth Sprout, MD 06/09/23 2317

## 2023-06-09 NOTE — ED Notes (Signed)
Pt glucose reading was 60, provider Plunkett made aware. Pt given snacks and oj and advised to drink and eat.

## 2023-06-10 ENCOUNTER — Other Ambulatory Visit: Payer: Self-pay

## 2023-06-10 ENCOUNTER — Encounter (HOSPITAL_COMMUNITY): Payer: Self-pay | Admitting: Internal Medicine

## 2023-06-10 DIAGNOSIS — K5900 Constipation, unspecified: Secondary | ICD-10-CM | POA: Diagnosis present

## 2023-06-10 DIAGNOSIS — N2581 Secondary hyperparathyroidism of renal origin: Secondary | ICD-10-CM | POA: Diagnosis present

## 2023-06-10 DIAGNOSIS — Z992 Dependence on renal dialysis: Secondary | ICD-10-CM | POA: Diagnosis not present

## 2023-06-10 DIAGNOSIS — F32A Depression, unspecified: Secondary | ICD-10-CM | POA: Diagnosis present

## 2023-06-10 DIAGNOSIS — I2510 Atherosclerotic heart disease of native coronary artery without angina pectoris: Secondary | ICD-10-CM | POA: Diagnosis present

## 2023-06-10 DIAGNOSIS — J810 Acute pulmonary edema: Secondary | ICD-10-CM | POA: Diagnosis present

## 2023-06-10 DIAGNOSIS — I252 Old myocardial infarction: Secondary | ICD-10-CM | POA: Diagnosis not present

## 2023-06-10 DIAGNOSIS — Z7982 Long term (current) use of aspirin: Secondary | ICD-10-CM | POA: Diagnosis not present

## 2023-06-10 DIAGNOSIS — I10 Essential (primary) hypertension: Secondary | ICD-10-CM | POA: Diagnosis not present

## 2023-06-10 DIAGNOSIS — I5032 Chronic diastolic (congestive) heart failure: Secondary | ICD-10-CM | POA: Diagnosis not present

## 2023-06-10 DIAGNOSIS — E8770 Fluid overload, unspecified: Secondary | ICD-10-CM

## 2023-06-10 DIAGNOSIS — E785 Hyperlipidemia, unspecified: Secondary | ICD-10-CM | POA: Diagnosis present

## 2023-06-10 DIAGNOSIS — I5023 Acute on chronic systolic (congestive) heart failure: Secondary | ICD-10-CM | POA: Diagnosis present

## 2023-06-10 DIAGNOSIS — J9601 Acute respiratory failure with hypoxia: Secondary | ICD-10-CM

## 2023-06-10 DIAGNOSIS — I25810 Atherosclerosis of coronary artery bypass graft(s) without angina pectoris: Secondary | ICD-10-CM

## 2023-06-10 DIAGNOSIS — E876 Hypokalemia: Secondary | ICD-10-CM | POA: Diagnosis present

## 2023-06-10 DIAGNOSIS — I132 Hypertensive heart and chronic kidney disease with heart failure and with stage 5 chronic kidney disease, or end stage renal disease: Secondary | ICD-10-CM | POA: Diagnosis present

## 2023-06-10 DIAGNOSIS — D631 Anemia in chronic kidney disease: Secondary | ICD-10-CM | POA: Diagnosis present

## 2023-06-10 DIAGNOSIS — E440 Moderate protein-calorie malnutrition: Secondary | ICD-10-CM | POA: Diagnosis present

## 2023-06-10 DIAGNOSIS — Z87891 Personal history of nicotine dependence: Secondary | ICD-10-CM | POA: Diagnosis not present

## 2023-06-10 DIAGNOSIS — E1122 Type 2 diabetes mellitus with diabetic chronic kidney disease: Secondary | ICD-10-CM | POA: Diagnosis present

## 2023-06-10 DIAGNOSIS — N289 Disorder of kidney and ureter, unspecified: Secondary | ICD-10-CM

## 2023-06-10 DIAGNOSIS — Z794 Long term (current) use of insulin: Secondary | ICD-10-CM | POA: Diagnosis not present

## 2023-06-10 DIAGNOSIS — Z79899 Other long term (current) drug therapy: Secondary | ICD-10-CM | POA: Diagnosis not present

## 2023-06-10 DIAGNOSIS — N186 End stage renal disease: Secondary | ICD-10-CM | POA: Diagnosis present

## 2023-06-10 DIAGNOSIS — E1165 Type 2 diabetes mellitus with hyperglycemia: Secondary | ICD-10-CM | POA: Diagnosis present

## 2023-06-10 DIAGNOSIS — I255 Ischemic cardiomyopathy: Secondary | ICD-10-CM | POA: Diagnosis present

## 2023-06-10 LAB — CBC
HCT: 27.5 % — ABNORMAL LOW (ref 36.0–46.0)
Hemoglobin: 8.9 g/dL — ABNORMAL LOW (ref 12.0–15.0)
MCH: 30.1 pg (ref 26.0–34.0)
MCHC: 32.4 g/dL (ref 30.0–36.0)
MCV: 92.9 fL (ref 80.0–100.0)
Platelets: 202 10*3/uL (ref 150–400)
RBC: 2.96 MIL/uL — ABNORMAL LOW (ref 3.87–5.11)
RDW: 14.8 % (ref 11.5–15.5)
WBC: 7.8 10*3/uL (ref 4.0–10.5)
nRBC: 0 % (ref 0.0–0.2)

## 2023-06-10 LAB — PHOSPHORUS: Phosphorus: 3.6 mg/dL (ref 2.5–4.6)

## 2023-06-10 LAB — CREATININE, SERUM
Creatinine, Ser: 10.53 mg/dL — ABNORMAL HIGH (ref 0.44–1.00)
GFR, Estimated: 4 mL/min — ABNORMAL LOW (ref >60)

## 2023-06-10 LAB — MRSA NEXT GEN BY PCR, NASAL: MRSA by PCR Next Gen: NOT DETECTED

## 2023-06-10 LAB — GLUCOSE, CAPILLARY
Glucose-Capillary: 136 mg/dL — ABNORMAL HIGH (ref 70–99)
Glucose-Capillary: 61 mg/dL — ABNORMAL LOW (ref 70–99)
Glucose-Capillary: 82 mg/dL (ref 70–99)
Glucose-Capillary: 76 mg/dL (ref 70–99)
Glucose-Capillary: 115 mg/dL — ABNORMAL HIGH (ref 70–99)
Glucose-Capillary: 283 mg/dL — ABNORMAL HIGH (ref 70–99)
Glucose-Capillary: 333 mg/dL — ABNORMAL HIGH (ref 70–99)
Glucose-Capillary: 362 mg/dL — ABNORMAL HIGH (ref 70–99)

## 2023-06-10 LAB — CBG MONITORING, ED
Glucose-Capillary: 53 mg/dL — ABNORMAL LOW (ref 70–99)
Glucose-Capillary: 186 mg/dL — ABNORMAL HIGH (ref 70–99)
Glucose-Capillary: 53 mg/dL — ABNORMAL LOW (ref 70–99)

## 2023-06-10 LAB — HEMOGLOBIN A1C
Hgb A1c MFr Bld: 8.1 % — ABNORMAL HIGH (ref 4.8–5.6)
Mean Plasma Glucose: 185.77 mg/dL

## 2023-06-10 MED ORDER — INSULIN ASPART 100 UNIT/ML IJ SOLN
0.0000 [IU] | INTRAMUSCULAR | Status: DC
  Administered 2023-06-10: 4 [IU] via SUBCUTANEOUS
  Administered 2023-06-10: 3 [IU] via SUBCUTANEOUS
  Administered 2023-06-11: 2 [IU] via SUBCUTANEOUS
  Administered 2023-06-11: 5 [IU] via SUBCUTANEOUS
  Administered 2023-06-11: 2 [IU] via SUBCUTANEOUS
  Administered 2023-06-11: 1 [IU] via SUBCUTANEOUS
  Administered 2023-06-12: 2 [IU] via SUBCUTANEOUS
  Administered 2023-06-12: 1 [IU] via SUBCUTANEOUS
  Administered 2023-06-12: 3 [IU] via SUBCUTANEOUS
  Administered 2023-06-13: 2 [IU] via SUBCUTANEOUS

## 2023-06-10 MED ORDER — HYDRALAZINE HCL 25 MG PO TABS
25.0000 mg | ORAL_TABLET | Freq: Two times a day (BID) | ORAL | Status: DC
  Administered 2023-06-10 – 2023-06-12 (×5): 25 mg via ORAL
  Filled 2023-06-10 (×5): qty 1

## 2023-06-10 MED ORDER — INSULIN GLARGINE-YFGN 100 UNIT/ML ~~LOC~~ SOLN
20.0000 [IU] | Freq: Every day | SUBCUTANEOUS | Status: DC
  Filled 2023-06-10: qty 0.2

## 2023-06-10 MED ORDER — INSULIN ASPART 100 UNIT/ML IJ SOLN: 0.0000 [IU] | Freq: Every day | INTRAMUSCULAR | Status: DC

## 2023-06-10 MED ORDER — LACTULOSE 10 GM/15ML PO SOLN
45.0000 g | Freq: Once | ORAL | Status: AC
  Administered 2023-06-10: 45 g via ORAL
  Filled 2023-06-10 (×2): qty 75

## 2023-06-10 MED ORDER — ACETAMINOPHEN 650 MG RE SUPP: 650.0000 mg | Freq: Four times a day (QID) | RECTAL | Status: DC | PRN

## 2023-06-10 MED ORDER — DELFLEX-LC/1.5% DEXTROSE 344 MOSM/L IP SOLN: INTRAPERITONEAL | Status: DC

## 2023-06-10 MED ORDER — DELFLEX-LC/4.25% DEXTROSE 483 MOSM/L IP SOLN: INTRAPERITONEAL | Status: DC

## 2023-06-10 MED ORDER — ACETAMINOPHEN 325 MG PO TABS
650.0000 mg | ORAL_TABLET | Freq: Four times a day (QID) | ORAL | Status: DC | PRN
  Administered 2023-06-12: 650 mg via ORAL
  Filled 2023-06-10: qty 2

## 2023-06-10 MED ORDER — MAGNESIUM CITRATE PO SOLN
1.0000 | Freq: Once | ORAL | Status: AC
  Administered 2023-06-10: 1 via ORAL
  Filled 2023-06-10: qty 296

## 2023-06-10 MED ORDER — INSULIN ASPART 100 UNIT/ML IJ SOLN: 0.0000 [IU] | Freq: Three times a day (TID) | INTRAMUSCULAR | Status: DC

## 2023-06-10 MED ORDER — DEXTROSE 50 % IV SOLN
1.0000 | Freq: Once | INTRAVENOUS | Status: AC
  Administered 2023-06-10: 50 mL via INTRAVENOUS
  Filled 2023-06-10: qty 50

## 2023-06-10 MED ORDER — ONDANSETRON HCL 4 MG/2ML IJ SOLN
4.0000 mg | Freq: Four times a day (QID) | INTRAMUSCULAR | Status: DC | PRN
  Administered 2023-06-13: 4 mg via INTRAVENOUS
  Filled 2023-06-10: qty 2

## 2023-06-10 MED ORDER — POLYETHYLENE GLYCOL 3350 17 G PO PACK
17.0000 g | PACK | Freq: Every day | ORAL | Status: DC
  Administered 2023-06-11 – 2023-06-13 (×3): 17 g via ORAL
  Filled 2023-06-10 (×5): qty 1

## 2023-06-10 MED ORDER — HEPARIN SODIUM (PORCINE) 5000 UNIT/ML IJ SOLN
5000.0000 [IU] | Freq: Three times a day (TID) | INTRAMUSCULAR | Status: DC
  Administered 2023-06-10 – 2023-06-13 (×9): 5000 [IU] via SUBCUTANEOUS
  Filled 2023-06-10 (×9): qty 1

## 2023-06-10 MED ORDER — METOPROLOL TARTRATE 5 MG/5ML IV SOLN: 10.0000 mg | INTRAVENOUS | Status: DC | PRN

## 2023-06-10 MED ORDER — GENTAMICIN SULFATE 0.1 % EX CREA
1.0000 | TOPICAL_CREAM | Freq: Every day | CUTANEOUS | Status: DC
  Administered 2023-06-10: 1 via TOPICAL
  Filled 2023-06-10: qty 15

## 2023-06-10 MED ORDER — HYDRALAZINE HCL 20 MG/ML IJ SOLN
10.0000 mg | Freq: Four times a day (QID) | INTRAMUSCULAR | Status: DC | PRN
  Administered 2023-06-12: 10 mg via INTRAVENOUS
  Filled 2023-06-10: qty 1

## 2023-06-10 MED ORDER — ISOSORBIDE MONONITRATE ER 30 MG PO TB24
30.0000 mg | ORAL_TABLET | Freq: Every day | ORAL | Status: DC
  Administered 2023-06-10 – 2023-06-13 (×4): 30 mg via ORAL
  Filled 2023-06-10 (×4): qty 1

## 2023-06-10 MED ORDER — LACTULOSE 10 GM/15ML PO SOLN
30.0000 g | Freq: Three times a day (TID) | ORAL | Status: DC
  Administered 2023-06-12: 30 g via ORAL
  Filled 2023-06-10 (×7): qty 45

## 2023-06-10 MED ORDER — ISOSORBIDE MONONITRATE ER 30 MG PO TB24: 30.0000 mg | ORAL_TABLET | Freq: Every day | ORAL | Status: DC

## 2023-06-10 MED ORDER — ONDANSETRON HCL 4 MG PO TABS: 4.0000 mg | ORAL_TABLET | Freq: Four times a day (QID) | ORAL | Status: DC | PRN

## 2023-06-10 MED ORDER — ALBUTEROL SULFATE (2.5 MG/3ML) 0.083% IN NEBU: 2.5000 mg | INHALATION_SOLUTION | RESPIRATORY_TRACT | Status: DC | PRN

## 2023-06-10 MED ORDER — HYDRALAZINE HCL 25 MG PO TABS: 25.0000 mg | ORAL_TABLET | Freq: Three times a day (TID) | ORAL | Status: DC

## 2023-06-10 MED ORDER — CINACALCET HCL 30 MG PO TABS
30.0000 mg | ORAL_TABLET | Freq: Every day | ORAL | Status: DC
  Administered 2023-06-10 – 2023-06-13 (×4): 30 mg via ORAL
  Filled 2023-06-10 (×4): qty 1

## 2023-06-10 MED ORDER — POLYETHYLENE GLYCOL 3350 17 G PO PACK: 17.0000 g | PACK | Freq: Every day | ORAL | Status: DC

## 2023-06-10 MED ORDER — INSULIN GLARGINE-YFGN 100 UNIT/ML ~~LOC~~ SOLN
10.0000 [IU] | Freq: Every day | SUBCUTANEOUS | Status: DC
  Filled 2023-06-10: qty 0.1

## 2023-06-10 MED ORDER — HEPARIN SODIUM (PORCINE) 1000 UNIT/ML IJ SOLN
INTRAPERITONEAL | Status: DC | PRN
  Filled 2023-06-10: qty 6000

## 2023-06-10 MED ORDER — ATORVASTATIN CALCIUM 80 MG PO TABS
80.0000 mg | ORAL_TABLET | Freq: Every day | ORAL | Status: DC
  Administered 2023-06-10 – 2023-06-12 (×3): 80 mg via ORAL
  Filled 2023-06-10 (×3): qty 1

## 2023-06-10 MED ORDER — DELFLEX-LC/2.5% DEXTROSE 394 MOSM/L IP SOLN: INTRAPERITONEAL | Status: DC

## 2023-06-10 NOTE — H&P (Addendum)
History and Physical    Melissa Howe:[id number] DOB: September 22, 1951 DOA: 06/09/2023  PCP: Raymon Mutton., FNP  Patient coming from: home  I have personally briefly reviewed patient's old medical records in [facility]  Chief Complaint: sob/doe lower extremity swelling  HPI: Melissa Howe is a 72 y.o. female with medical history significant of constipation, hyperlipidemia, [name] s/p NSTEMI, ischemic CMY, CHFref,  ESRD on HD with recent conversion to PD 1 mo ago who presents to ED with complaint of  1-2 weeks of progressive sob/doe/ b/l lower extremity swelling.  Patient on evaluation found to have fluid overload with acute hypoxic respiratory failure.  Patient noted no fever/chills/ n/v/d or chest pain  but does note persistent constipation.  ED Course:  IN ed patient was discussed with nephrology and fluid overload was thought  to  related to recent switch to PD insetting o severe constipation and insufficient ultrafiltration.   Patient was admitted for further trial of PD and treatment of constipation with hope of discharge home with PD. However if PD not effective patient will be transitioned back to HD.   Labs Afeb bp 195/85-215/95, hr 90, rr 28 , sat 100% on 2L EKG: sinus brady Wbc: 9.6, hgb 10.3, plt 233 Plt 233 Na: 142, K 3.6,  cr 10.55  Cxr: IMPRESSION: 1. Cardiomegaly with mildly distended pulmonary vasculature. 2. Mild airspace disease at the lung bases, possible atelectasis, edema, or infiltrate. 3. Suspected small bilateral pleural effusions.    Review of Systems: As per HPI otherwise 10 point review of systems negative.   Past Medical History:  Diagnosis Date   Anemia    CHF (congestive heart failure) (HCC)    Chronic kidney disease    Constipation    Coronary artery disease    Diabetes mellitus    Type II   Dyspnea    with exertion   ESRD (end stage renal disease) (HCC)    History of blood transfusion    Hyperlipemia    Hypertension     Ischemic cardiomyopathy    Myocardial infarction [facility])    Pneumonia 2019    Past Surgical History:  Procedure Laterality Date   ABDOMINAL AORTOGRAM W/LOWER EXTREMITY N/A 10/15/2022   Procedure: ABDOMINAL AORTOGRAM W/LOWER EXTREMITY;  Surgeon: Victorino Sparrow, MD;  Location: [facility] INVASIVE CV LAB;  Service: Cardiovascular;  Laterality: N/A;   APPENDECTOMY     AV FISTULA PLACEMENT Left 10/05/2019   Procedure: INSERTION OF ARTERIOVENOUS (AV) GORE-TEX VASCULAR GRAFT LEFT ARM;  Surgeon: Larina Earthly, MD;  Location: [facility] OR;  Service: Vascular;  Laterality: Left;   AV FISTULA PLACEMENT Left 02/27/2021   Procedure: LEFT ARM FIRST STAGE BASILIC VEIN  ARTERIOVENOUS (AV) FISTULA CREATION;  Surgeon: Maeola Harman, MD;  Location: [facility] OR;  Service: Vascular;  Laterality: Left;   BASCILIC VEIN TRANSPOSITION Left 04/17/2021   Procedure: LEFT SECOND STAGE BASCILIC VEIN TRANSPOSITION;  Surgeon: Maeola Harman, MD;  Location[facility] OR;  Service: Vascular;  Laterality: Left;   BIOPSY  07/24/2020   Procedure: BIOPSY;  Surgeon: Sherrilyn Rist, MD;  Location: [facility] ENDOSCOPY;  Service: Gastroenterology;;   BUBBLE STUDY  01/02/2020   Procedure: BUBBLE STUDY;  Surgeon: Parke Poisson, MD;  Location: [facility] ENDOSCOPY;  Service: Cardiology;;   CAPD INSERTION N/A 05/01/2023   Procedure: LAPAROSCOPIC INSERTION CONTINUOUS AMBULATORY PERITONEAL DIALYSIS  (CAPD) CATHETER;  Surgeon: Maeola Harman, MD;  Location: [facility] OR;  Service: Vascular;  Laterality: N/A;   CARDIAC  CATHETERIZATION     COLONOSCOPY WITH PROPOFOL N/A 07/24/2020   Procedure: COLONOSCOPY WITH PROPOFOL;  Surgeon: Sherrilyn Rist, MD;  Location: [facility] ENDOSCOPY;  Service: Gastroenterology;  Laterality: N/A;   CORONARY STENT INTERVENTION N/A 07/12/2021   Procedure: CORONARY STENT INTERVENTION;  Surgeon: Corky Crafts, MD;  Location: [facility] INVASIVE CV LAB;  Service: Cardiovascular;  Laterality: N/A;   CORONARY ULTRASOUND/IVUS N/A  07/12/2021   Procedure: Intravascular Ultrasound/IVUS;  Surgeon: Corky Crafts, MD;  Location: [facility] INVASIVE CV LAB;  Service: Cardiovascular;  Laterality: N/A;   ESOPHAGOGASTRODUODENOSCOPY (EGD) WITH PROPOFOL N/A 07/24/2020   Procedure: ESOPHAGOGASTRODUODENOSCOPY (EGD) WITH PROPOFOL;  Surgeon: Sherrilyn Rist, MD;  Location: [facility] ENDOSCOPY;  Service: Gastroenterology;  Laterality: N/A;   EYE SURGERY Bilateral    Cataract   FOREIGN BODY REMOVAL Left 05/13/2018   Procedure: FOREIGN BODY REMOVAL left foot;  Surgeon: Cammy Copa, MD;  Location: [facility] OR;  Service: Orthopedics;  Laterality: Left;   I & D EXTREMITY Left 05/21/2018   Procedure: LEFT FOOT DEBRIDEMENT AND WOUND CLOSURE;  Surgeon: Nadara Mustard, MD;  Location: [facility] OR;  Service: Orthopedics;  Laterality: Left;   LEFT HEART CATH AND CORONARY ANGIOGRAPHY N/A 07/12/2021   Procedure: LEFT HEART CATH AND CORONARY ANGIOGRAPHY;  Surgeon: Corky Crafts, MD;  Location: [facility] INVASIVE CV LAB;  Service: Cardiovascular;  Laterality: N/A;   POLYPECTOMY  07/24/2020   Procedure: POLYPECTOMY;  Surgeon: Sherrilyn Rist, MD;  Location: [facility] ENDOSCOPY;  Service: Gastroenterology;;   TEE WITHOUT CARDIOVERSION N/A 01/02/2020   Procedure: TRANSESOPHAGEAL ECHOCARDIOGRAM (TEE);  Surgeon: Parke Poisson, MD;  Location: [facility] ENDOSCOPY;  Service: Cardiology;  Laterality: N/A;   TONSILLECTOMY     TUBAL LIGATION       reports that she quit smoking about 39 years ago. Her smoking use included cigarettes. She started smoking about 43 years ago. She has never been exposed to tobacco smoke. She has never used smokeless tobacco. She reports that she does not drink alcohol and does not use drugs.  No Known Allergies  Family History  Problem Relation Age of Onset   Hypertension Mother    Diabetes Mother    Hypertension Sister     Prior to Admission medications   Medication Sig Start Date End Date Taking? Authorizing Provider  amLODipine  (NORVASC) 10 MG tablet Take 10 mg by mouth daily.    [provider]  aspirin 81 MG chewable tablet Chew 1 tablet (81 mg total) by mouth daily. 07/13/21   Ghimire, Werner Lean, MD  atorvastatin (LIPITOR) 80 MG tablet Take 1 tablet (80 mg total) by mouth daily. Patient taking differently: Take 80 mg by mouth at bedtime. 07/13/21   Ghimire, Werner Lean, MD  calcitRIOL (ROCALTROL) 0.5 MCG capsule Take 5 capsules (2.5 mcg total) by mouth Every Tuesday,Thursday,and Saturday with dialysis. 07/24/20   Pokhrel, Rebekah Chesterfield, MD  cinacalcet (SENSIPAR) 30 MG tablet Take 30 mg by mouth daily. With snack    [provider]  hydrALAZINE (APRESOLINE) 25 MG tablet Take 1 tablet (25 mg total) by mouth every 8 (eight) hours. Patient taking differently: Take 25 mg by mouth 2 (two) times daily with a meal. 07/13/21   Ghimire, Werner Lean, MD  Hyprom-Naphaz-Polysorb-Zn Sulf (CLEAR EYES COMPLETE OP) Place 1 drop into both eyes daily as needed (dry eyes).    [provider]  Insulin Glargine Solostar (LANTUS) 100 UNIT/ML Solostar Pen Inject 20 Units as directed daily. 11/11/22   [provider]  Insulin Pen Needle 32G X 4 MM MISC Use with insulin pen to dispense insulin as directed 09/08/16   Tat, Onalee Hua, MD  isosorbide mononitrate (IMDUR) 30 MG 24 hr tablet Take 1 tablet (30 mg total) by mouth daily. 07/13/21   Ghimire, Werner Lean, MD  Methoxy PEG-Epoetin Beta (MIRCERA IJ) Mircera 12/11/22 12/10/23  [provider]  metoCLOPramide (REGLAN) 5 MG tablet Take 5 mg by mouth Every Tuesday,Thursday,and Saturday with dialysis. 09/19/22   [provider]  oxyCODONE-acetaminophen (PERCOCET) 5-325 MG tablet Take 1 tablet by mouth every 6 (six) hours as needed for severe pain. 05/01/23 04/30/24  Baglia, Corrina, PA-C  sucroferric oxyhydroxide (VELPHORO) 500 MG chewable tablet Chew 1,000-1,500 mg by mouth See admin instructions. Take 1500 mg with each meal and 1000 mg with snacks    [provider]  triamcinolone lotion (KENALOG) 0.1 % Apply 1 Application topically 2 (two) times daily.    [provider]    Physical Exam: Vitals:   06/10/23 0228 06/10/23 0240 06/10/23 0300 06/10/23 0600  BP: (!) 193/80  (!) 166/71 (!) 162/72  Pulse:   69 70  Resp: 16  18 20   Temp: (!) 97.4 F (36.3 C)   98.1 F (36.7 C)  TempSrc: Oral   Oral  SpO2: 98%  98% 98%  Weight:  91.9 kg    Height:  5' (1.524 m)      Constitutional: NAD, calm, comfortable Vitals:   06/10/23 0228 06/10/23 0240 06/10/23 0300 06/10/23 0600  BP: (!) 193/80  (!) 166/71 (!) 162/72  Pulse:   69 70  Resp: 16  18 20   Temp: (!) 97.4 F (36.3 C)   98.1 F (36.7 C)  TempSrc: Oral   Oral  SpO2: 98%  98% 98%  Weight:  91.9 kg    Height:  5' (1.524 m)     Eyes: PERRL, lids and conjunctivae normal ENMT: Mucous membranes are moist. Posterior pharynx clear of any exudate or lesions.Normal dentition.  Neck: normal, supple, no masses, no thyromegaly Respiratory: clear to auscultation bilaterally, no wheezing, no crackles. Normal respiratory effort. No accessory muscle use. Diminished at bases Cardiovascular: Regular rate and rhythm, no murmurs / rubs / gallops. + b/l extremity edema. 2+ pedal pulses. No carotid bruits.  Abdomen: no tenderness, no masses palpated. No hepatosplenomegaly. Bowel sounds positive. distended Musculoskeletal: no clubbing / cyanosis. No joint deformity upper and lower extremities. Good ROM, no contractures. Normal muscle tone.  Skin: no rashes, lesions, ulcers. No induration Neurologic: CN 2-12 grossly intact. Sensation intact Strength 5/5 in all 4.  Psychiatric: Normal judgment and insight. Alert and oriented x 3. Normal mood.    Labs on Admission: I have personally reviewed following labs and imaging studies  CBC: Recent Labs  Lab 06/09/23 2133  WBC 9.6  HGB 10.3*  HCT 31.9*  MCV 92.5  PLT 233   Basic Metabolic Panel: Recent Labs  Lab 06/09/23 2204  NA 142  K 3.6  CL 99   CO2 30  GLUCOSE 75  BUN 55*  CREATININE 10.55*  CALCIUM 8.6*   GFR: Estimated Creatinine Clearance: 4.8 mL/min (A) (by C-G formula based on SCr of 10.55 mg/dL (H)). Liver Function Tests: No results for input(s): "AST", "ALT", "ALKPHOS", "BILITOT", "PROT", "ALBUMIN" in the last 168 hours. No results for input(s): "LIPASE", "AMYLASE" in the last 168 hours. No results for input(s): "AMMONIA" in the last 168 hours. Coagulation Profile: No results for input(s): "INR", "PROTIME" in the last 168 hours. Cardiac  Enzymes: No results for input(s): "CKTOTAL", "CKMB", "CKMBINDEX", "TROPONINI" in the last 168 hours. BNP (last 3 results) No results for input(s): "PROBNP" in the last 8760 hours. HbA1C: No results for input(s): "HGBA1C" in the last 72 hours. CBG: Recent Labs  Lab 06/10/23 0135 06/10/23 0148 06/10/23 0235 06/10/23 0617 06/10/23 0710  GLUCAP 53* 186* 136* 61* 82   Lipid Profile: No results for input(s): "CHOL", "HDL", "LDLCALC", "TRIG", "CHOLHDL", "LDLDIRECT" in the last 72 hours. Thyroid Function Tests: No results for input(s): "TSH", "T4TOTAL", "FREET4", "T3FREE", "THYROIDAB" in the last 72 hours. Anemia Panel: No results for input(s): "VITAMINB12", "FOLATE", "FERRITIN", "TIBC", "IRON", "RETICCTPCT" in the last 72 hours. Urine analysis:    Component Value Date/Time   COLORURINE YELLOW 05/31/2022 0612   APPEARANCEUR CLEAR 05/31/2022 0612   LABSPEC 1.012 05/31/2022 0612   PHURINE 8.0 05/31/2022 0612   GLUCOSEU >=500 (A) 05/31/2022 0612   HGBUR NEGATIVE 05/31/2022 0612   BILIRUBINUR NEGATIVE 05/31/2022 0612   KETONESUR NEGATIVE 05/31/2022 0612   PROTEINUR 100 (A) 05/31/2022 0612   UROBILINOGEN 1.0 07/13/2015 1543   NITRITE NEGATIVE 05/31/2022 0612   LEUKOCYTESUR NEGATIVE 05/31/2022 0612    Radiological Exams on Admission: DG Chest Port 1 View  Result Date: 06/09/2023 CLINICAL DATA:  Shortness of breath, bilateral swollen legs and feet. EXAM: PORTABLE CHEST 1  VIEW COMPARISON:  03/12/2023. FINDINGS: Heart is enlarged and the mediastinal contour is within normal limits. There is atherosclerotic calcification of the aorta. The pulmonary vasculature is mildly distended. Mild airspace disease is noted at the lung bases bilaterally. There is blunting of the costophrenic angles bilaterally, possible small pleural effusions. No pneumothorax. No acute osseous abnormality. IMPRESSION: 1. Cardiomegaly with mildly distended pulmonary vasculature. 2. Mild airspace disease at the lung bases, possible atelectasis, edema, or infiltrate. 3. Suspected small bilateral pleural effusions. Electronically Signed   By: Thornell Sartorius M.D.   On: 06/09/2023 22:34    EKG: Independently reviewed.   Assessment/Plan   Fluid overload due to ESRD with acute hypoxic respiratory failure -in setting of sub optimal ultrafiltration s/p switch from HD to PD and history of constipation -plan as outlined by renal  -treat constipation  -attempt further optimization with CCPD -if failure will have to switch back to HD -of note patient has been weaned of O2   Uncontrolled Hypertension -due to volume  - treatment per renal recs  -resume home regimen ( hydralazine) -holding amlodipine pre renal recs    Constipation -place on bowel regimen  - additional lactulose x 1   Hyperlipidemia -resume statin    [name] s/p NSTEMI -s/p pci to pLAD -continue imdur,asa,liptor  Acute CHF ref 35-40% exacerbation -fluid overload , volume managed with PD  DVT prophylaxis: heparin Code Status:full/ as discussed per patient wishes in event of cardiac arrest  Family Communication  Miles,Crystal (Daughter) [phone] (Mobile)   Disposition Plan: patient  expected to be admitted greater than 2 midnights  Consults called:  Renal  DR Valentino Nose Admission status: progressive   Lurline Del MD [facility]  If 7PM-7AM, please contact night-coverage [email or web address] Password  [facility]  06/10/2023, 8:11 AM

## 2023-06-10 NOTE — Progress Notes (Addendum)
Mobility Specialist Progress Note:   06/10/23 1058  Mobility  Activity Ambulated with assistance in hallway  Level of Assistance Contact guard assist, steadying assist  Assistive Device None  Distance Ambulated (ft) 350 ft  Activity Response Tolerated well  Mobility Referral Yes  $Mobility charge 1 Mobility  Mobility Specialist Start Time (ACUTE ONLY) 0935  Mobility Specialist Stop Time (ACUTE ONLY) 0947  Mobility Specialist Time Calculation (min) (ACUTE ONLY) 12 min    Pre Mobility: 77 HR , 164/81 BP , 95% SpO2 During Mobility: 109 HR ,  95% SpO2 Post Mobility: 81 HR , 98% SpO2  Pt received in bed, agreeable to mobility. Independent bed mobility. CG during ambulation. Pt displayed slight SOB towards end of session, otherwise asymptomatic throughout. Pt left in bed with call bell in hand and all need met.   Leory Plowman  Mobility Specialist Please contact via [employer] office at [phone]

## 2023-06-10 NOTE — TOC CM/SW Note (Signed)
Transition of Care [facility]) - Inpatient Brief Assessment   Patient Details  Name: Melissa Howe MRN: [id number] Date of Birth: 07-Nov-1950  [facility]) CM/SW Contact:    Harriet Masson, RN Phone Number: 06/10/2023, 1:41 PM   Clinical Narrative: Patient has been doing peritoneal dialysis at home for the last month. Likely insufficient ultrafiltration on PD. TOC following.  Transition of Care Asessment: Insurance and Status: Insurance coverage has been reviewed Patient has primary care physician: Yes Home environment has been reviewed: safe to discharge home Prior level of function:: independent Prior/Current Home Services: No current home services Social Determinants of Health Reivew: SDOH reviewed no interventions necessary Readmission risk has been reviewed: Yes Transition of care needs: no transition of care needs at this time

## 2023-06-10 NOTE — Progress Notes (Addendum)
Hospitalist Transfer Note:    Nursing staff, Please call [facility] Admits & Consults System-Wide number on Amion [phone]) as soon as patient's arrival, so appropriate admitting provider can evaluate the pt.   Transferring facility: [facility] Requesting provider: Dr. Anitra Lauth (EDP at [facility] ([place] Reason for transfer: admission for further evaluation and management of volume overload complicated by acute hypoxic respiratory distress.     72 year old female with history of end-stage renal disease, recently converted from hemodialysis to peritoneal dialysis 1 month ago, who presented to [facility] ED complaining of shortness of breath associated with progressive edema involving the bilateral lower extremities, orthopnea and with the symptoms noted to be progressive over the last 1 to 2 weeks.  She has a history of end-stage renal disease and had been on hemodialysis for 3 years, before recently being switched to peritoneal dialysis 1 month ago on the basis of development of depression from the need for routine attendance at the dialysis center for her HD.  She notes that she has been very compliant with her peritoneal dialysis over the last month, but has developed the above progressive symptoms and spite of this good compliance.  Vital signs in the ED were notable for the following: In the absence of any known supplemental oxygen requirements, initial oxygen saturations in the 80s on room air, Sosan improving to 100% on 2 L nasal cannula.  Respiratory rate 15-28.  Labs were notable for potassium 3.6.  Imaging notable for chest x-ray which showed cardiomegaly, distended pulmonary vasculature, bibasilar airspace opacities felt to represent edema versus infiltrate versus atelectasis, also showing small bilateral pleural effusions.  [facility] EDP has d/w on-call nephrology, Dr. Malen Gauze, who will formally consult;  Subsequently, I accepted this patient for transfer for /inpatient admission to a PCU bed at [facility] for further work-up  and management of the above.    Of note, this may end up being an ED to ED transfer depending upon the amount of time it takes for a bed to become available at [facility].      Newton Pigg, DO Hospitalist

## 2023-06-10 NOTE — Plan of Care (Signed)
  Problem: Clinical Measurements: Goal: Diagnostic test results will improve Outcome: Progressing Goal: Respiratory complications will improve Outcome: Progressing Goal: Cardiovascular complication will be avoided Outcome: Progressing   Problem: Pain Managment: Goal: General experience of comfort will improve Outcome: Progressing   Problem: Safety: Goal: Ability to remain free from injury will improve Outcome: Progressing   

## 2023-06-10 NOTE — Consult Note (Addendum)
ESRD Consult Note  Requesting provider: Skip Mayer Service requesting consult: Hospitalist Reason for consult: ESRD, provision of dialysis Indication for acute dialysis?: End Stage Renal Disease  Outpatient dialysis unit: [facility], home Outpatient dialysis prescription: CAPD 3 exchanges, EDW 74.1  Assessment/Recommendations: [name] is a/an 72 y.o. female with a past medical history notable for ESRD on HD admitted with volume overload  # ESRD: Volume overload in the setting of switching to peritoneal dialysis recently.  Likely insufficient ultrafiltration on PD and constipation contributing to decreased membrane efficiency -Constipation management as below -Plan for CCPD 6 cycles, 1.5-hour time, 2.5 L fill volume, no day dwell -all 4.25% bags tonight and adjust as needed -Consider hemodialysis if PD not effective  # Volume/ hypertension: EDW 74.1 in [facility]. Weight 91.9kg here!  Obviously volume overloaded on exam.  Plan as above.  Continue home blood pressure medications; holding amlodipine right now.  Blood pressure will likely improve with ultrafiltration  # Anemia of Chronic Kidney Disease: Hemoglobin around 10.  No therapy needed at this time  # Secondary Hyperparathyroidism/Hyperphosphatemia: Continue home Sensipar 30 mg daily no longer on calcitriol. On velphoro 2 tabs with meals at home.  Will obtain phosphorus level  # Constipation: Mag citrate now.  MiraLAX daily.  Titrate as needed  # Additional recommendations: - Dose all meds for creatinine clearance < 10 ml/min  - Unless absolutely necessary, no MRIs with gadolinium.  - Implement save arm precautions.  Prefer needle sticks in the dorsum of the hands or wrists.  No blood pressure measurements in arm. - If blood transfusion is requested during hemodialysis sessions, please alert [place] prior to the session.  - Use synthetic opioids (Fentanyl/Dilaudid) if needed  Recommendations were discussed with the primary  team.   History of Present Illness: Melissa Howe is a/an 72 y.o. female with a past medical history of ESRD who presents with worsening lower extremity edema.  Patient was previously maintained on hemodialysis.  Switched to peritoneal dialysis about a month ago.  Has been doing CAPD.  3 cycles during the day.  Initially was going well.  Over the past couple weeks has had worsening lower extremity edema.  She contacted her dialysis unit.  It sounds like she was having some constipation so they recommended bowel regimen as well as using 4.25% bags intermittently.  The patient has been following their recommendations.  She does not feel like she has had regular bowel movements and does not think that she has ongoing constipation.  However, she has been using MiraLAX over-the-counter and was not sure what else to do to help her have more regular bowel movements.  She also has been using the 4.25% bags and feels like she gets a good amount of fluid off but admits it may not be enough.  She has noticed her weight is increased.  She does have some orthopnea as well as shortness of breath particularly with exertion.  No significant abdominal pain.  No cloudy effluent.     Medications:  Current Facility-Administered Medications  Medication Dose Route Frequency Provider Last Rate Last Admin   acetaminophen (TYLENOL) tablet 650 mg  650 mg Oral Q6H PRN Lurline Del, MD       Or   acetaminophen (TYLENOL) suppository 650 mg  650 mg Rectal Q6H PRN Lurline Del, MD       albuterol (PROVENTIL) (2.5 MG/3ML) 0.083% nebulizer solution 2.5 mg  2.5 mg Nebulization Q2H PRN Lurline Del, MD  dialysis solution 4.25% low-MG/low-CA dianeal solution   Intraperitoneal Q24H Silvana Newness, RPH       gentamicin cream (GARAMYCIN) 0.1 % 1 Application  1 Application Topical Daily Darnell Level, MD       heparin injection 5,000 Units  5,000 Units Subcutaneous Q8H Skip Mayer A, MD        heparin sodium (porcine) 3,000 Units in dialysis solution 4.25% low-MG/low-CA 6,000 mL dialysis solution   Peritoneal Dialysis PRN Silvana Newness, RPH       hydrALAZINE (APRESOLINE) tablet 25 mg  25 mg Oral BID Crosley, Debby, MD   25 mg at 06/10/23 0846   insulin aspart (novoLOG) injection 0-6 Units  0-6 Units Subcutaneous Q4H Skip Mayer A, MD       isosorbide mononitrate (IMDUR) 24 hr tablet 30 mg  30 mg Oral Daily Crosley, Debby, MD   30 mg at 06/10/23 0846   magnesium citrate solution 1 Bottle  1 Bottle Oral Once Darnell Level, MD       metoprolol tartrate (LOPRESSOR) injection 10 mg  10 mg Intravenous Q4H PRN Crosley, Debby, MD       ondansetron (ZOFRAN) tablet 4 mg  4 mg Oral Q6H PRN Lurline Del, MD       Or   ondansetron [facility]) injection 4 mg  4 mg Intravenous Q6H PRN Lurline Del, MD       [START ON 06/11/2023] polyethylene glycol (MIRALAX / GLYCOLAX) packet 17 g  17 g Oral Daily Darnell Level, MD         ALLERGIES Patient has no known allergies.  MEDICAL HISTORY Past Medical History:  Diagnosis Date   Anemia    CHF (congestive heart failure) (HCC)    Chronic kidney disease    Constipation    Coronary artery disease    Diabetes mellitus    Type II   Dyspnea    with exertion   ESRD (end stage renal disease) (HCC)    History of blood transfusion    Hyperlipemia    Hypertension    Ischemic cardiomyopathy    Myocardial infarction [facility])    Pneumonia 2019     SOCIAL HISTORY Social History   Socioeconomic History   Marital status: Divorced    Spouse name: Not on file   Number of children: Not on file   Years of education: Not on file   Highest education level: Not on file  Occupational History   Occupation: Retired  Tobacco Use   Smoking status: Former    Current packs/day: 0.00    Types: Cigarettes    Start date: 01/19/1980    Quit date: 01/19/1984    Years since quitting: 39.4    Passive exposure: Never   Smokeless tobacco: Never   Vaping Use   Vaping status: Never Used  Substance and Sexual Activity   Alcohol use: No   Drug use: No   Sexual activity: Not on file  Other Topics Concern   Not on file  Social History Narrative   Lives in [place] with her dtr.  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  Recently moved from disability to "retired" when she turned 31.  Sedentary.  Knits frequently for fun.   Social Determinants of Health   Financial Resource Strain: Not on file  Food Insecurity: No Food Insecurity (06/10/2023)   Hunger Vital Sign    Worried About Running Out of Food in the Last  Year: Never true    Ran Out of Food in the Last Year: Never true  Transportation Needs: No Transportation Needs (06/10/2023)   PRAPARE - Administrator, Civil Service (Medical): No    Lack of Transportation (Non-Medical): No  Physical Activity: Unknown (08/06/2019)   Exercise Vital Sign    Days of Exercise per Week: Patient declined    Minutes of Exercise per Session: Patient declined  Stress: No Stress Concern Present (08/06/2019)   [employer] of Occupational Health - Occupational Stress Questionnaire    Feeling of Stress : Only a little  Social Connections: Unknown (12/15/2022)   Received from [facility]   Social Network    Social Network: Not on file  Intimate Partner Violence: Not At Risk (06/10/2023)   Humiliation, Afraid, Rape, and Kick questionnaire    Fear of Current or Ex-Partner: No    Emotionally Abused: No    Physically Abused: No    Sexually Abused: No     FAMILY HISTORY Family History  Problem Relation Age of Onset   Hypertension Mother    Diabetes Mother    Hypertension Sister      Review of Systems: 12 systems were reviewed and negative except per HPI  Physical Exam: Vitals:   06/10/23 0600 06/10/23 0800  BP: (!) 162/72 (!) 170/75  Pulse: 70 70  Resp: 20 20  Temp: 98.1 F (36.7 C) 97.9 F (36.6 C)  SpO2: 98% 97%   Total I/O In: 120  [P.O.:120] Out: -   Intake/Output Summary (Last 24 hours) at 06/10/2023 1013 Last data filed at 06/10/2023 7829 Gross per 24 hour  Intake 120 ml  Output --  Net 120 ml   General: well-appearing, no acute distress HEENT: anicteric sclera, MMM CV: normal rate, no murmurs, 2+ edema up to the thighs bilaterally Lungs: bilateral chest rise, normal wob Abd: soft, non-tender, non-distended Skin: no visible lesions or rashes Psych: alert, engaged, appropriate mood and affect Neuro: normal speech, no gross focal deficits   Test Results Reviewed Lab Results  Component Value Date   NA 142 06/09/2023   K 3.6 06/09/2023   CL 99 06/09/2023   CO2 30 06/09/2023   BUN 55 (H) 06/09/2023   CREATININE 10.55 (H) 06/09/2023   CALCIUM 8.6 (L) 06/09/2023   ALBUMIN 3.0 (L) 10/09/2022   PHOS 6.2 (H) 10/09/2022    I have reviewed relevant outside healthcare records

## 2023-06-10 NOTE — Progress Notes (Signed)
   06/10/23 0228  Vitals  Temp (!) 97.4 F (36.3 C)  Temp Source Oral  BP (!) 193/80  MAP (mmHg) 110  BP Location Right Arm  BP Method Automatic  Patient Position (if appropriate) Lying  ECG Heart Rate 76  Resp 16  MEWS COLOR  MEWS Score Color Green  Oxygen Therapy  SpO2 98 %  O2 Device Room Air  Pain Assessment  Pain Scale 0-10  Pain Score 0  MEWS Score  MEWS Temp 0  MEWS Systolic 0  MEWS Pulse 0  MEWS RR 0  MEWS LOC 0  MEWS Score 0   Patient arrived to unit.  No complaints of pain.  [facility] admits notified, assigned to Dr. Joneen Roach.  CBG 136.

## 2023-06-11 DIAGNOSIS — I10 Essential (primary) hypertension: Secondary | ICD-10-CM | POA: Diagnosis not present

## 2023-06-11 DIAGNOSIS — I5032 Chronic diastolic (congestive) heart failure: Secondary | ICD-10-CM

## 2023-06-11 DIAGNOSIS — E440 Moderate protein-calorie malnutrition: Secondary | ICD-10-CM

## 2023-06-11 DIAGNOSIS — E1165 Type 2 diabetes mellitus with hyperglycemia: Secondary | ICD-10-CM

## 2023-06-11 DIAGNOSIS — J9601 Acute respiratory failure with hypoxia: Secondary | ICD-10-CM | POA: Diagnosis not present

## 2023-06-11 DIAGNOSIS — I25810 Atherosclerosis of coronary artery bypass graft(s) without angina pectoris: Secondary | ICD-10-CM | POA: Diagnosis not present

## 2023-06-11 DIAGNOSIS — Z794 Long term (current) use of insulin: Secondary | ICD-10-CM

## 2023-06-11 LAB — COMPREHENSIVE METABOLIC PANEL
ALT: 22 U/L (ref 0–44)
AST: 22 U/L (ref 15–41)
Albumin: 3 g/dL — ABNORMAL LOW (ref 3.5–5.0)
Alkaline Phosphatase: 83 U/L (ref 38–126)
Anion gap: 14 (ref 5–15)
BUN: 49 mg/dL — ABNORMAL HIGH (ref 8–23)
CO2: 26 mmol/L (ref 22–32)
Calcium: 8 mg/dL — ABNORMAL LOW (ref 8.9–10.3)
Chloride: 97 mmol/L — ABNORMAL LOW (ref 98–111)
Creatinine, Ser: 10.14 mg/dL — ABNORMAL HIGH (ref 0.44–1.00)
GFR, Estimated: 4 mL/min — ABNORMAL LOW (ref >60)
Glucose, Bld: 298 mg/dL — ABNORMAL HIGH (ref 70–99)
Potassium: 3.5 mmol/L (ref 3.5–5.1)
Sodium: 137 mmol/L (ref 135–145)
Total Bilirubin: 0.4 mg/dL (ref 0.3–1.2)
Total Protein: 6.4 g/dL — ABNORMAL LOW (ref 6.5–8.1)

## 2023-06-11 LAB — CBC
HCT: 30.4 % — ABNORMAL LOW (ref 36.0–46.0)
Hemoglobin: 9.5 g/dL — ABNORMAL LOW (ref 12.0–15.0)
MCH: 29.2 pg (ref 26.0–34.0)
MCHC: 31.3 g/dL (ref 30.0–36.0)
MCV: 93.5 fL (ref 80.0–100.0)
Platelets: 230 10*3/uL (ref 150–400)
RBC: 3.25 MIL/uL — ABNORMAL LOW (ref 3.87–5.11)
RDW: 14.8 % (ref 11.5–15.5)
WBC: 7.5 10*3/uL (ref 4.0–10.5)
nRBC: 0 % (ref 0.0–0.2)

## 2023-06-11 LAB — GLUCOSE, CAPILLARY
Glucose-Capillary: 200 mg/dL — ABNORMAL HIGH (ref 70–99)
Glucose-Capillary: 230 mg/dL — ABNORMAL HIGH (ref 70–99)
Glucose-Capillary: 248 mg/dL — ABNORMAL HIGH (ref 70–99)
Glucose-Capillary: 357 mg/dL — ABNORMAL HIGH (ref 70–99)
Glucose-Capillary: 85 mg/dL (ref 70–99)
Glucose-Capillary: 97 mg/dL (ref 70–99)

## 2023-06-11 MED ORDER — INSULIN GLARGINE-YFGN 100 UNIT/ML ~~LOC~~ SOLN
9.0000 [IU] | Freq: Every day | SUBCUTANEOUS | Status: DC
  Administered 2023-06-11 – 2023-06-12 (×2): 9 [IU] via SUBCUTANEOUS
  Filled 2023-06-11 (×2): qty 0.09

## 2023-06-11 NOTE — Progress Notes (Addendum)
Mobility Specialist Progress Note:   06/11/23 1142  Mobility  Activity Ambulated with assistance in hallway  Level of Assistance Contact guard assist, steadying assist  Assistive Device None  Distance Ambulated (ft) 470 ft  Activity Response Tolerated well  Mobility Referral Yes  $Mobility charge 1 Mobility  Mobility Specialist Start Time (ACUTE ONLY) 1125  Mobility Specialist Stop Time (ACUTE ONLY) 1135  Mobility Specialist Time Calculation (min) (ACUTE ONLY) 10 min   During Mobility: 105 HR , 93%-98% SpO2 RA Post Mobility: 88 HR , 98% SpO2 RA  Pt received in bed, agreeable to mobility. Slight unsteady gait during ambulation requiring CG but no LOB. Pt denied any SOB or displayed fatigue during session, asymptomatic throughout. Pt returned to bed with call bell in hand and all needs met.   Leory Plowman  Mobility Specialist Please contact via [employer] office at [phone]

## 2023-06-11 NOTE — Procedures (Signed)
I was present during the patient's peritoneal dialysis session. I have reviewed the session itself and made appropriate changes.   Continue with aggressive UF with all 4.25% bags.  Filed Weights   06/10/23 0240 06/11/23 0340  Weight: 91.9 kg 91 kg    Recent Labs  Lab 06/10/23 1133 06/11/23 0030  NA  --  137  K  --  3.5  CL  --  97*  CO2  --  26  GLUCOSE  --  298*  BUN  --  49*  CREATININE 10.53* 10.14*  CALCIUM  --  8.0*  PHOS 3.6  --     Recent Labs  Lab 06/09/23 2133 06/10/23 1133 06/11/23 0030  WBC 9.6 7.8 7.5  HGB 10.3* 8.9* 9.5*  HCT 31.9* 27.5* 30.4*  MCV 92.5 92.9 93.5  PLT 233 202 230    Scheduled Meds:  atorvastatin  80 mg Oral QHS   cinacalcet  30 mg Oral Q breakfast   gentamicin cream  1 Application Topical Daily   heparin  5,000 Units Subcutaneous Q8H   hydrALAZINE  25 mg Oral BID   insulin aspart  0-6 Units Subcutaneous Q4H   isosorbide mononitrate  30 mg Oral Daily   lactulose  30 g Oral TID   polyethylene glycol  17 g Oral Daily   Continuous Infusions:  dialysis solution 4.25% low-MG/low-CA     PRN Meds:.acetaminophen **OR** acetaminophen, albuterol, heparin sodium (porcine) 3,000 Units in dialysis solution 4.25% low-MG/low-CA 6,000 mL dialysis solution, hydrALAZINE, ondansetron **OR** ondansetron (ZOFRAN) IV   Louie Bun,  MD 06/11/2023, 9:35 AM

## 2023-06-11 NOTE — Plan of Care (Signed)

## 2023-06-11 NOTE — Progress Notes (Addendum)
PROGRESS NOTE    Melissa Howe  RUE:[id number] DOB: 05/13/1951 DOA: 06/09/2023 PCP: Raymon Mutton., FNP    Brief Narrative:  Melissa Howe is a 72 y.o. female with past medical history  of constipation, hyperlipidemia, [name] ,  NSTEMI, ischemic cardiomyopathy congestive heart failure, ESRD on HD with recent conversion to PD 1 mo ago presented to the hospital with progressive shortness of breath dyspnea on exertion and bilateral lower extremity swelling for 1 to 2 weeks.  Patient on evaluation was found to have fluid overload with acute hypoxic respiratory failure.  In the ED nephrology was consulted and an impression of fluid overload due to recent switching to PD.  Initial labs showed hypertension with sinus bradycardia.  Hemoglobin of 9.6 with creatinine of 13.5.  Chest x-ray showed cardiomegaly with airspace opacities and distended pulmonary vasculature.  Patient was then considered for admission to the hospital for further evaluation and treatment.  Assessment/Plan   Fluid overload due to ESRD  -in setting of sub optimal ultrafiltration s/p switch from HD to PD and history of constipation.  Nephrology on board.  Nephrology attempting to do more CCPD.  If failure will need to change back to hemodialysis.  Patient has been weaned off oxygen at this time.  Uncontrolled Hypertension Likely secondary to volume overload.  Continue hydralazine.  Hold amlodipine.   Constipation Continue bowel regimen.  Received lactulose.  Had frequent bowel movements could back off with MiraLAX if necessary.   Hyperlipidemia Continue statins.   [name] s/p NSTEMI Status post PCI to LAD.  Continue Imdur aspirin Lipitor.  No active chest pain   Acute systolic CHF exacerbation.  LV ejection fraction of 35 to 40%, volume managed with PD.  Appears to be compensated today.  Anemia of chronic kidney disease.  Hemoglobin around 10.  Will closely monitor.  Diabetes mellitus type 2.  Patient is on Lantus 20  units at home.  Currently on sliding scale insulin.  Will add Semglee 9 units daily.  Continue to monitor closely.    DVT prophylaxis: heparin injection 5,000 Units Start: 06/10/23 1400   Code Status:     Code Status: Full Code  Disposition: Home likely in 2 to 3 days  Status is: Inpatient Remains inpatient appropriate because: Need for further dialysis, nephrology following   Family Communication: None at bedside  Consultants:  Nephrology  Procedures:  Peritoneal dialysis  Antimicrobials:  None  Anti-infectives (From admission, onward)    None      Subjective: Today, patient was seen and examined at bedside.  Stated that she feels a little better with breathing today.  Denies any cough or shortness of breath or chest pain.  Has had a few bowel movements.  Objective: Vitals:   06/11/23 0340 06/11/23 0401 06/11/23 0739 06/11/23 1139  BP:  (!) 155/74 (!) 176/64 (!) 184/76  Pulse:  81 77 97  Resp:   (!) 23 15  Temp:   97.9 F (36.6 C) 98 F (36.7 C)  TempSrc:   Oral Oral  SpO2:  96% 99% 100%  Weight: 91 kg     Height:        Intake/Output Summary (Last 24 hours) at 06/11/2023 1214 Last data filed at 06/11/2023 1007 Gross per 24 hour  Intake 120 ml  Output 2884 ml  Net -2764 ml   Filed Weights   06/10/23 0240 06/11/23 0340  Weight: 91.9 kg 91 kg    Physical Examination: Body mass index is 39.18 kg/m.  General: Obese built, not in obvious distress, on room air. HENT:   No scleral pallor or icterus noted. Oral mucosa is moist.  Chest:    Diminished breath sounds bilaterally. No crackles or wheezes.  CVS: S1 &S2 heard. No murmur.  Regular rate and rhythm. Abdomen: Soft, nontender, nondistended.  Bowel sounds are heard.  Peritoneal dialysis catheter in place. Extremities: No cyanosis, clubbing or edema.  Peripheral pulses are palpable. Psych: Alert, awake and Communicative. CNS:  No cranial nerve deficits.  Power equal in all extremities.   Skin: Warm  and dry.  No rashes noted.  Data Reviewed:   CBC: Recent Labs  Lab 06/09/23 2133 06/10/23 1133 06/11/23 0030  WBC 9.6 7.8 7.5  HGB 10.3* 8.9* 9.5*  HCT 31.9* 27.5* 30.4*  MCV 92.5 92.9 93.5  PLT 233 202 230    Basic Metabolic Panel: Recent Labs  Lab 06/09/23 2204 06/10/23 1133 06/11/23 0030  NA 142  --  137  K 3.6  --  3.5  CL 99  --  97*  CO2 30  --  26  GLUCOSE 75  --  298*  BUN 55*  --  49*  CREATININE 10.55* 10.53* 10.14*  CALCIUM 8.6*  --  8.0*  PHOS  --  3.6  --     Liver Function Tests: Recent Labs  Lab 06/11/23 0030  AST 22  ALT 22  ALKPHOS 83  BILITOT 0.4  PROT 6.4*  ALBUMIN 3.0*     Radiology Studies: DG Chest Port 1 View  Result Date: 06/09/2023 CLINICAL DATA:  Shortness of breath, bilateral swollen legs and feet. EXAM: PORTABLE CHEST 1 VIEW COMPARISON:  03/12/2023. FINDINGS: Heart is enlarged and the mediastinal contour is within normal limits. There is atherosclerotic calcification of the aorta. The pulmonary vasculature is mildly distended. Mild airspace disease is noted at the lung bases bilaterally. There is blunting of the costophrenic angles bilaterally, possible small pleural effusions. No pneumothorax. No acute osseous abnormality. IMPRESSION: 1. Cardiomegaly with mildly distended pulmonary vasculature. 2. Mild airspace disease at the lung bases, possible atelectasis, edema, or infiltrate. 3. Suspected small bilateral pleural effusions. Electronically Signed   By: Thornell Sartorius M.D.   On: 06/09/2023 22:34      LOS: 1 day    Joycelyn Das, MD [facility] Available via [employer] secure chat 7am-7pm After these hours, please refer to coverage provider listed on [email or web address] 06/11/2023, 12:14 PM

## 2023-06-11 NOTE — Progress Notes (Signed)
Nephrology Follow-Up Consult note   Outpatient dialysis unit: [facility], home Outpatient dialysis prescription: CAPD 3 exchanges, EDW 74.1   Assessment/Recommendations: Melissa Howe is a/an 72 y.o. female with a past medical history notable for ESRD on HD admitted with volume overload   # ESRD: Volume overload in the setting of switching to peritoneal dialysis recently.  Likely insufficient ultrafiltration on PD and constipation contributing to decreased membrane efficiency -Constipation management as below -Continue plan for CCPD 6 cycles, 1.5-hour time, 2.5 L fill volume, no day dwell -Continue w/ all 4.25% bags for now -Consider hemodialysis if PD not effective   # Volume/ hypertension: EDW 74.1 in [facility]. Weight 91.9kg here on arrival!  Obviously volume overloaded on exam.  Goal will be 2-3 days of PD here in the hospital with good UF. Then can return home and continue with altered PD prescription to achieve ongoing UF.  Continue home blood pressure medications; holding amlodipine right now.  Blood pressure will likely improve with ultrafiltration   # Anemia of Chronic Kidney Disease: Hemoglobin around 9-10.  May improve with UF. No therapy needed at this time   # Secondary Hyperparathyroidism/Hyperphosphatemia: Hold home sensipar given lower ca. Continue home binders.   # Constipation: Several bowel movements yesterday. Continue miralax daily.   # Additional recommendations: - Dose all meds for creatinine clearance < 10 ml/min  - Unless absolutely necessary, no MRIs with gadolinium.  - Implement save arm precautions.  Prefer needle sticks in the dorsum of the hands or wrists.  No blood pressure measurements in arm. - If blood transfusion is requested during hemodialysis sessions, please alert [place] prior to the session.  - Use synthetic opioids (Fentanyl/Dilaudid) if needed     Recommendations conveyed to primary service.    Darnell Level [facility] 06/11/2023 9:30 AM  ___________________________________________________________  CC: leg swelling  Interval History/Subjective: Feels well. PD went fine with no issues. Still with unchanged edema in the legs.   Medications:  Current Facility-Administered Medications  Medication Dose Route Frequency Provider Last Rate Last Admin   acetaminophen (TYLENOL) tablet 650 mg  650 mg Oral Q6H PRN Lurline Del, MD       Or   acetaminophen (TYLENOL) suppository 650 mg  650 mg Rectal Q6H PRN Lurline Del, MD       albuterol (PROVENTIL) (2.5 MG/3ML) 0.083% nebulizer solution 2.5 mg  2.5 mg Nebulization Q2H PRN Lurline Del, MD       atorvastatin (LIPITOR) tablet 80 mg  80 mg Oral QHS Skip Mayer A, MD   80 mg at 06/10/23 2027   cinacalcet (SENSIPAR) tablet 30 mg  30 mg Oral Q breakfast Darnell Level, MD   30 mg at 06/11/23 0813   dialysis solution 4.25% low-MG/low-CA dianeal solution   Intraperitoneal Q24H Silvana Newness, [facility]   New Bag at 06/10/23 1740   gentamicin cream (GARAMYCIN) 0.1 % 1 Application  1 Application Topical Daily Darnell Level, MD   1 Application at 06/10/23 1740   heparin injection 5,000 Units  5,000 Units Subcutaneous Q8H Skip Mayer A, MD   5,000 Units at 06/11/23 0500   heparin sodium (porcine) 3,000 Units in dialysis solution 4.25% low-MG/low-CA 6,000 mL dialysis solution   Peritoneal Dialysis PRN Silvana Newness, RPH       hydrALAZINE (APRESOLINE) injection 10 mg  10 mg Intravenous Q6H PRN Lurline Del, MD       hydrALAZINE (APRESOLINE) tablet 25 mg  25 mg Oral BID Gery Pray, MD   25 mg at 06/11/23 0813   insulin aspart (novoLOG) injection 0-6 Units  0-6 Units Subcutaneous Q4H Lurline Del, MD   2 Units at 06/11/23 0813   isosorbide mononitrate (IMDUR) 24 hr tablet 30 mg  30 mg Oral Daily Crosley, Debby, MD   30 mg at 06/11/23 0813   lactulose (CHRONULAC) 10 GM/15ML solution 30 g  30 g Oral TID Lurline Del, MD       ondansetron [facility]) tablet 4 mg  4 mg Oral Q6H PRN Lurline Del, MD       Or   ondansetron [facility]) injection 4 mg  4 mg Intravenous Q6H PRN Lurline Del, MD       polyethylene glycol (MIRALAX / GLYCOLAX) packet 17 g  17 g Oral Daily Lurline Del, MD   17 g at 06/11/23 0813      Review of Systems: 10 systems reviewed and negative except per interval history/subjective  Physical Exam: Vitals:   06/11/23 0401 06/11/23 0739  BP: (!) 155/74 (!) 176/64  Pulse: 81 77  Resp:  (!) 23  Temp:  97.9 F (36.6 C)  SpO2: 96% 99%   No intake/output data recorded.  Intake/Output Summary (Last 24 hours) at 06/11/2023 0930 Last data filed at 06/10/2023 2030 Gross per 24 hour  Intake 120 ml  Output --  Net 120 ml   Constitutional: well-appearing, no acute distress ENMT: ears and nose without scars or lesions, MMM CV: normal rate, 2+ edema up to thighs bilaterally Respiratory: bilateral chest rise, normal work of breathing Gastrointestinal: soft, non-tender, no palpable masses or hernias Skin: no visible lesions or rashes Psych: alert, judgement/insight appropriate, appropriate mood and affect   Test Results I personally reviewed new and old clinical labs and radiology tests Lab Results  Component Value Date   NA 137 06/11/2023   K 3.5 06/11/2023   CL 97 (L) 06/11/2023   CO2 26 06/11/2023   BUN 49 (H) 06/11/2023   CREATININE 10.14 (H) 06/11/2023   CALCIUM 8.0 (L) 06/11/2023   ALBUMIN 3.0 (L) 06/11/2023   PHOS 3.6 06/10/2023    CBC Recent Labs  Lab 06/09/23 2133 06/10/23 1133 06/11/23 0030  WBC 9.6 7.8 7.5  HGB 10.3* 8.9* 9.5*  HCT 31.9* 27.5* 30.4*  MCV 92.5 92.9 93.5  PLT 233 202 230

## 2023-06-11 NOTE — Procedures (Signed)
PD Patient treatment initiated without issue.  Patient education concerning the importance of fluid intake monitoring, dependent edema, and movement done with patient.

## 2023-06-11 NOTE — Inpatient Diabetes Management (Signed)
Inpatient Diabetes Program Recommendations  AACE/ADA: New Consensus Statement on Inpatient Glycemic Control   Target Ranges:  Prepandial:   less than 140 mg/dL      Peak postprandial:   less than 180 mg/dL (1-2 hours)      Critically ill patients:  140 - 180 mg/dL    Latest Reference Range & Units 06/11/23 03:37 06/11/23 07:42  Glucose-Capillary 70 - 99 mg/dL 202 (H) 542 (H)    Latest Reference Range & Units 06/10/23 06:17 06/10/23 07:10 06/10/23 11:31 06/10/23 17:15 06/10/23 19:55 06/10/23 23:20 06/10/23 23:27  Glucose-Capillary 70 - 99 mg/dL 61 (L) 82 76 706 (H) 237 (H) 362 (H) 333 (H)   Review of Glycemic Control  Diabetes history: DM2 Outpatient Diabetes medications: Lantus 20 units daily Current orders for Inpatient glycemic control: Novolog 0-6 units Q4H  Inpatient Diabetes Program Recommendations:    Insulin: Please consider ordering Semglee 9 units Q24H.  Thanks, Orlando Penner, RN, MSN, CDCES Diabetes Coordinator Inpatient Diabetes Program [phone] (Team Pager from 8am to 5pm)

## 2023-06-12 DIAGNOSIS — J9601 Acute respiratory failure with hypoxia: Secondary | ICD-10-CM | POA: Diagnosis not present

## 2023-06-12 LAB — BASIC METABOLIC PANEL
Anion gap: 13 (ref 5–15)
BUN: 41 mg/dL — ABNORMAL HIGH (ref 8–23)
CO2: 27 mmol/L (ref 22–32)
Calcium: 8.1 mg/dL — ABNORMAL LOW (ref 8.9–10.3)
Chloride: 98 mmol/L (ref 98–111)
Creatinine, Ser: 9.49 mg/dL — ABNORMAL HIGH (ref 0.44–1.00)
GFR, Estimated: 4 mL/min — ABNORMAL LOW (ref >60)
Glucose, Bld: 258 mg/dL — ABNORMAL HIGH (ref 70–99)
Potassium: 3 mmol/L — ABNORMAL LOW (ref 3.5–5.1)
Sodium: 138 mmol/L (ref 135–145)

## 2023-06-12 LAB — CBC
HCT: 31.1 % — ABNORMAL LOW (ref 36.0–46.0)
Hemoglobin: 9.6 g/dL — ABNORMAL LOW (ref 12.0–15.0)
MCH: 29.2 pg (ref 26.0–34.0)
MCHC: 30.9 g/dL (ref 30.0–36.0)
MCV: 94.5 fL (ref 80.0–100.0)
Platelets: 228 10*3/uL (ref 150–400)
RBC: 3.29 MIL/uL — ABNORMAL LOW (ref 3.87–5.11)
RDW: 15.1 % (ref 11.5–15.5)
WBC: 6.8 10*3/uL (ref 4.0–10.5)
nRBC: 0 % (ref 0.0–0.2)

## 2023-06-12 LAB — GLUCOSE, CAPILLARY
Glucose-Capillary: 192 mg/dL — ABNORMAL HIGH (ref 70–99)
Glucose-Capillary: 201 mg/dL — ABNORMAL HIGH (ref 70–99)
Glucose-Capillary: 133 mg/dL — ABNORMAL HIGH (ref 70–99)
Glucose-Capillary: 120 mg/dL — ABNORMAL HIGH (ref 70–99)
Glucose-Capillary: 130 mg/dL — ABNORMAL HIGH (ref 70–99)
Glucose-Capillary: 261 mg/dL — ABNORMAL HIGH (ref 70–99)

## 2023-06-12 LAB — MAGNESIUM: Magnesium: 1.9 mg/dL (ref 1.7–2.4)

## 2023-06-12 MED ORDER — INSULIN GLARGINE-YFGN 100 UNIT/ML ~~LOC~~ SOLN
12.0000 [IU] | Freq: Every evening | SUBCUTANEOUS | Status: DC
  Filled 2023-06-12: qty 0.12

## 2023-06-12 MED ORDER — POTASSIUM CHLORIDE CRYS ER 20 MEQ PO TBCR
40.0000 meq | EXTENDED_RELEASE_TABLET | Freq: Every day | ORAL | Status: DC
  Administered 2023-06-12 – 2023-06-13 (×2): 40 meq via ORAL
  Filled 2023-06-12 (×2): qty 2

## 2023-06-12 MED ORDER — HYDRALAZINE HCL 50 MG PO TABS
50.0000 mg | ORAL_TABLET | Freq: Two times a day (BID) | ORAL | Status: DC
  Administered 2023-06-12 – 2023-06-13 (×2): 50 mg via ORAL
  Filled 2023-06-12 (×2): qty 1

## 2023-06-12 NOTE — Plan of Care (Signed)

## 2023-06-12 NOTE — Progress Notes (Signed)
Nephrology Follow-Up Consult note   Outpatient dialysis unit: [facility], home Outpatient dialysis prescription: CAPD 3 exchanges, EDW 74.1   Assessment/Recommendations: Melissa Howe is a/an 72 y.o. female with a past medical history notable for ESRD on HD admitted with volume overload   # ESRD: Volume overload in the setting of switching to peritoneal dialysis recently.  Likely insufficient ultrafiltration on PD and constipation contributing to decreased membrane efficiency -Constipation now resolved -Continue plan for CCPD 6 cycles, 1.5-hour dwell time, lower to 2 L fill volume, no day dwell -Continue w/ all 4.25% bags for now -Discussed with HT team: plan is to DC tomorrow morning if everything goes well. Will continue with only 4.25% bags through the weekend. She will add a 4th exchange mid day. She will talk with the team on Monday for further recs.   # Volume/ hypertension: EDW 74.1 in [facility]. Weight 91.9kg here on arrival!  Obviously volume overloaded on exam.  Improved with efforts as above.   # Anemia of Chronic Kidney Disease: Hemoglobin around 9-10.  May improve with UF. No therapy needed at this time   # Secondary Hyperparathyroidism/Hyperphosphatemia: Hold home sensipar given lower ca. Continue home binders.   # Constipation: Now resolved miralax daily.  #Hypokalemia: oral potassium ordered.   # Additional recommendations: - Dose all meds for creatinine clearance < 10 ml/min  - Unless absolutely necessary, no MRIs with gadolinium.  - Implement save arm precautions.  Prefer needle sticks in the dorsum of the hands or wrists.  No blood pressure measurements in arm. - If blood transfusion is requested during hemodialysis sessions, please alert [place] prior to the session.  - Use synthetic opioids (Fentanyl/Dilaudid) if needed     Recommendations conveyed to primary service.    Darnell Level [facility] 06/12/2023 10:27  AM  ___________________________________________________________  CC: leg swelling  Interval History/Subjective: Patient overall feels well today.  Great ultrafiltration last night with PD.  Did have some abdominal pain on her last exchange this morning.  Otherwise denies any complaints.  Edema is improved.   Medications:  Current Facility-Administered Medications  Medication Dose Route Frequency Provider Last Rate Last Admin   acetaminophen (TYLENOL) tablet 650 mg  650 mg Oral Q6H PRN Lurline Del, MD   650 mg at 06/12/23 0148   Or   acetaminophen (TYLENOL) suppository 650 mg  650 mg Rectal Q6H PRN Lurline Del, MD       albuterol (PROVENTIL) (2.5 MG/3ML) 0.083% nebulizer solution 2.5 mg  2.5 mg Nebulization Q2H PRN Lurline Del, MD       atorvastatin (LIPITOR) tablet 80 mg  80 mg Oral QHS Skip Mayer A, MD   80 mg at 06/11/23 2050   cinacalcet (SENSIPAR) tablet 30 mg  30 mg Oral Q breakfast Darnell Level, MD   30 mg at 06/12/23 1610   dialysis solution 4.25% low-MG/low-CA dianeal solution   Intraperitoneal Q24H Silvana Newness, [facility]   New Bag at 06/10/23 1740   gentamicin cream (GARAMYCIN) 0.1 % 1 Application  1 Application Topical Daily Darnell Level, MD   1 Application at 06/10/23 1740   heparin injection 5,000 Units  5,000 Units Subcutaneous Q8H Skip Mayer A, MD   5,000 Units at 06/12/23 0523   heparin sodium (porcine) 3,000 Units in dialysis solution 4.25% low-MG/low-CA 6,000 mL dialysis solution   Peritoneal Dialysis PRN Silvana Newness, RPH       hydrALAZINE (APRESOLINE) injection 10 mg  10  mg Intravenous Q6H PRN Skip Mayer A, MD       hydrALAZINE (APRESOLINE) tablet 25 mg  25 mg Oral BID Joneen Roach, Debby, MD   25 mg at 06/12/23 0833   insulin aspart (novoLOG) injection 0-6 Units  0-6 Units Subcutaneous Q4H Skip Mayer A, MD   2 Units at 06/12/23 0831   [START ON 06/13/2023] insulin glargine-yfgn (SEMGLEE) injection 12 Units  12 Units  Subcutaneous QPM Zannie Cove, MD       isosorbide mononitrate (IMDUR) 24 hr tablet 30 mg  30 mg Oral Daily Crosley, Debby, MD   30 mg at 06/12/23 0833   lactulose (CHRONULAC) 10 GM/15ML solution 30 g  30 g Oral TID Lurline Del, MD       ondansetron [facility]) tablet 4 mg  4 mg Oral Q6H PRN Lurline Del, MD       Or   ondansetron [facility]) injection 4 mg  4 mg Intravenous Q6H PRN Lurline Del, MD       polyethylene glycol (MIRALAX / GLYCOLAX) packet 17 g  17 g Oral Daily Skip Mayer A, MD   17 g at 06/12/23 0840   potassium chloride SA (KLOR-CON M) CR tablet 40 mEq  40 mEq Oral Daily Darnell Level, MD   40 mEq at 06/12/23 7829      Review of Systems: 10 systems reviewed and negative except per interval history/subjective  Physical Exam: Vitals:   06/12/23 0622 06/12/23 0735  BP:  (!) 183/74  Pulse: 87 75  Resp: 16 17  Temp:  98.3 F (36.8 C)  SpO2: 96% 98%   No intake/output data recorded.  Intake/Output Summary (Last 24 hours) at 06/12/2023 1027 Last data filed at 06/12/2023 0636 Gross per 24 hour  Intake 240 ml  Output 1994 ml  Net -1754 ml   Constitutional: well-appearing, no acute distress ENMT: ears and nose without scars or lesions, MMM CV: normal rate, 2+ edema up to thighs bilaterally but improved Respiratory: bilateral chest rise, normal work of breathing Gastrointestinal: soft, non-tender, no palpable masses or hernias Skin: no visible lesions or rashes Psych: alert, judgement/insight appropriate, appropriate mood and affect   Test Results I personally reviewed new and old clinical labs and radiology tests Lab Results  Component Value Date   NA 138 06/12/2023   K 3.0 (L) 06/12/2023   CL 98 06/12/2023   CO2 27 06/12/2023   BUN 41 (H) 06/12/2023   CREATININE 9.49 (H) 06/12/2023   CALCIUM 8.1 (L) 06/12/2023   ALBUMIN 3.0 (L) 06/11/2023   PHOS 3.6 06/10/2023    CBC Recent Labs  Lab 06/10/23 1133 06/11/23 0030  06/12/23 0122  WBC 7.8 7.5 6.8  HGB 8.9* 9.5* 9.6*  HCT 27.5* 30.4* 31.1*  MCV 92.9 93.5 94.5  PLT 202 230 228

## 2023-06-12 NOTE — Progress Notes (Signed)
Mobility Specialist Progress Note:   06/12/23 1002  Mobility  Activity Ambulated with assistance in hallway  Level of Assistance Contact guard assist, steadying assist  Assistive Device None  Distance Ambulated (ft) 350 ft  Activity Response Tolerated well  Mobility Referral Yes  $Mobility charge 1 Mobility  Mobility Specialist Start Time (ACUTE ONLY) 0946  Mobility Specialist Stop Time (ACUTE ONLY) 0956  Mobility Specialist Time Calculation (min) (ACUTE ONLY) 10 min   During Mobility: 103 HR , 95% SpO2 Post Mobility: 88 HR , 98% SpO2  Pt received in bed, agreeable to mobility. Slight unsteady gait during ambulation requiring CG but no LOB. Pt denied any discomfort or SOB during ambulation, asymptomatic throughout. Pt returned to bed with call bell in hand and all needs met.   Leory Plowman  Mobility Specialist Please contact via [employer] office at [phone]

## 2023-06-12 NOTE — Progress Notes (Signed)
I was present during the patient's peritoneal dialysis session. I have reviewed the session itself and made appropriate changes.   Continue with aggressive UF with all 4.25% bags. Will lower fill volume given Abd pain this morning  Filed Weights   06/10/23 0240 06/11/23 0340 06/12/23 0622  Weight: 91.9 kg 91 kg 86.9 kg    Recent Labs  Lab 06/10/23 1133 06/11/23 0030 06/12/23 0122  NA  --    < > 138  K  --    < > 3.0*  CL  --    < > 98  CO2  --    < > 27  GLUCOSE  --    < > 258*  BUN  --    < > 41*  CREATININE 10.53*   < > 9.49*  CALCIUM  --    < > 8.1*  PHOS 3.6  --   --    < > = values in this interval not displayed.    Recent Labs  Lab 06/10/23 1133 06/11/23 0030 06/12/23 0122  WBC 7.8 7.5 6.8  HGB 8.9* 9.5* 9.6*  HCT 27.5* 30.4* 31.1*  MCV 92.9 93.5 94.5  PLT 202 230 228    Scheduled Meds:  atorvastatin  80 mg Oral QHS   cinacalcet  30 mg Oral Q breakfast   gentamicin cream  1 Application Topical Daily   heparin  5,000 Units Subcutaneous Q8H   hydrALAZINE  25 mg Oral BID   insulin aspart  0-6 Units Subcutaneous Q4H   [START ON 06/13/2023] insulin glargine-yfgn  12 Units Subcutaneous QPM   isosorbide mononitrate  30 mg Oral Daily   lactulose  30 g Oral TID   polyethylene glycol  17 g Oral Daily   potassium chloride  40 mEq Oral Daily   Continuous Infusions:  dialysis solution 4.25% low-MG/low-CA     PRN Meds:.acetaminophen **OR** acetaminophen, albuterol, heparin sodium (porcine) 3,000 Units in dialysis solution 4.25% low-MG/low-CA 6,000 mL dialysis solution, hydrALAZINE, ondansetron **OR** ondansetron (ZOFRAN) IV   Louie Bun,  MD 06/12/2023, 10:27 AM

## 2023-06-12 NOTE — Progress Notes (Signed)
Patient is alert and oriented x4. Refused po laxatives last night. She complained of back pain due to position in bed. Repositioned and gave her po tylenol-she went to sleep afterwards. Blood glucose is unpredictable at night. She was 200, 357 and 192. When asked did she eat something when the 357 resulted, patient stated that she had not had anything but a cup of applejuice. On PD dialysis in which they pulled off 2884 mls yesterday. She had another dialysis session throughout the night that has yet to complete. Patient denied additional needs.

## 2023-06-12 NOTE — Inpatient Diabetes Management (Signed)
Inpatient Diabetes Program Recommendations  AACE/ADA: New Consensus Statement on Inpatient Glycemic Control   Target Ranges:  Prepandial:   less than 140 mg/dL      Peak postprandial:   less than 180 mg/dL (1-2 hours)      Critically ill patients:  140 - 180 mg/dL    Latest Reference Range & Units 06/11/23 07:42 06/11/23 11:41 06/11/23 16:26 06/11/23 19:45 06/11/23 23:30 06/12/23 03:19  Glucose-Capillary 70 - 99 mg/dL 130 (H) 97 85 865 (H) 784 (H) 192 (H)   Review of Glycemic Control  Diabetes history: DM2 Outpatient Diabetes medications: Lantus 20 units daily Current orders for Inpatient glycemic control: Semglee 9 units daily, Novolog 0-6 units Q4H   Inpatient Diabetes Program Recommendations:     Insulin: Glucose is more elevated during the night. Question if due to glucose in peritoneal dialysis solution.  Please consider increasing Semglee 12 units and change administration time to 20:00.    Thanks, Orlando Penner, RN, MSN, CDCES Diabetes Coordinator Inpatient Diabetes Program [phone] (Team Pager from 8am to 5pm)

## 2023-06-12 NOTE — Procedures (Signed)
PD note  Patient completed treatment.  Patient stated that she had pain in the area of her catheter site and her lower back with the last fill of the night.  She stated she rested well during the previous portion of her treatment. Patient total UF was 1994 ml.

## 2023-06-12 NOTE — Progress Notes (Signed)
PROGRESS NOTE    Melissa Howe  WNU:[id number] DOB: March 28, 1951 DOA: 06/09/2023 PCP: Raymon Mutton., FNP    Brief Narrative:  Melissa Howe is a 72 y.o. female with past medical history  of constipation, hyperlipidemia, [name] ,  NSTEMI, ischemic cardiomyopathy congestive heart failure, ESRD on HD with recent conversion to PD 1 mo ago presented to the hospital with progressive shortness of breath dyspnea on exertion and bilateral lower extremity swelling for 1 to 2 weeks.  Patient on evaluation was found to have fluid overload with acute hypoxic respiratory failure.  In the ED nephrology was consulted and an impression of fluid overload due to recent switching to PD.  Initial labs showed hypertension with sinus bradycardia.  Hemoglobin of 9.6 with creatinine of 13.5.  Chest x-ray showed cardiomegaly with airspace opacities and distended pulmonary vasculature.  Patient was then considered for admission to the hospital for further evaluation and treatment.   Subjective: Feels better overall, still with significant swelling   Assessment/Plan   Fluid overload due to ESRD  -in setting of sub optimal ultrafiltration s/p switch from HD to PD 1 month ago and history of constipation.  Nephrology on board.  Nephrology attempting to do more CCPD.  -Weight is down 11 LB -Weaned off O2 -Anticipate need for likely 48 hours more of inpatient PD  Uncontrolled Hypertension Likely secondary to volume overload.  Continue hydralazine, will increase dose to 50 Mg twice daily   Constipation Continue bowel regimen.  Received lactulose.  -On MiraLAX   Hyperlipidemia Continue statins.   [name] s/p NSTEMI Status post PCI to LAD.  Continue Imdur aspirin Lipitor.  No active chest pain   Acute systolic CHF exacerbation.  LV ejection fraction of 35 to 40%, volume managed with PD.   -Remains volume overloaded  Anemia of chronic kidney disease.  Hemoglobin around 10.  Will closely monitor.  Diabetes  mellitus type 2.  Patient is on Lantus 20 units at home.  Currently on sliding scale insulin.  Will add Semglee 9 units daily.  Continue to monitor closely.    DVT prophylaxis: Heparin subcutaneous   Code Status: Full Code  Disposition: Home likely 48 hours  Family Communication: None at bedside  Consultants:  Nephrology  Procedures:  Peritoneal dialysis  Antimicrobials:  None  Anti-infectives (From admission, onward)    None       Objective: Vitals:   06/11/23 1925 06/11/23 2331 06/12/23 0320 06/12/23 0622  BP: (!) 181/74 (!) 155/74 130/71   Pulse: 79 88 88 87  Resp: 19 18 (!) 22 16  Temp: 98.1 F (36.7 C) 97.6 F (36.4 C) 98 F (36.7 C)   TempSrc: Oral Oral Oral   SpO2: 97% 94% 94% 96%  Weight:    86.9 kg  Height:        Intake/Output Summary (Last 24 hours) at 06/12/2023 0347 Last data filed at 06/11/2023 2050 Gross per 24 hour  Intake 240 ml  Output 2884 ml  Net -2644 ml   Filed Weights   06/10/23 0240 06/11/23 0340 06/12/23 0622  Weight: 91.9 kg 91 kg 86.9 kg    Physical Examination: Body mass index is 37.42 kg/m.   Gen: Awake, Alert, Oriented X 3,  HEENT: no JVD Lungs: Decreased breath sounds CVS: S1S2/RRR Abd: soft, Non tender, non distended, BS present, PD catheter Extremities: No edema Skin: no new rashes on exposed skin   Data Reviewed:   CBC: Recent Labs  Lab 06/09/23 2133 06/10/23 1133  06/11/23 0030 06/12/23 0122  WBC 9.6 7.8 7.5 6.8  HGB 10.3* 8.9* 9.5* 9.6*  HCT 31.9* 27.5* 30.4* 31.1*  MCV 92.5 92.9 93.5 94.5  PLT 233 202 230 228    Basic Metabolic Panel: Recent Labs  Lab 06/09/23 2204 06/10/23 1133 06/11/23 0030 06/12/23 0122  NA 142  --  137 138  K 3.6  --  3.5 3.0*  CL 99  --  97* 98  CO2 30  --  26 27  GLUCOSE 75  --  298* 258*  BUN 55*  --  49* 41*  CREATININE 10.55* 10.53* 10.14* 9.49*  CALCIUM 8.6*  --  8.0* 8.1*  MG  --   --   --  1.9  PHOS  --  3.6  --   --     Liver Function Tests: Recent Labs   Lab 06/11/23 0030  AST 22  ALT 22  ALKPHOS 83  BILITOT 0.4  PROT 6.4*  ALBUMIN 3.0*     Radiology Studies: No results found.    LOS: 2 days    Zannie Cove, MD [facility] Available via Epic secure chat 7am-7pm After these hours, please refer to coverage provider listed on [email or web address] 06/12/2023, 6:32 AM

## 2023-06-13 DIAGNOSIS — J9601 Acute respiratory failure with hypoxia: Secondary | ICD-10-CM | POA: Diagnosis not present

## 2023-06-13 LAB — GLUCOSE, CAPILLARY
Glucose-Capillary: 211 mg/dL — ABNORMAL HIGH (ref 70–99)
Glucose-Capillary: 179 mg/dL — ABNORMAL HIGH (ref 70–99)

## 2023-06-13 MED ORDER — SENNOSIDES-DOCUSATE SODIUM 8.6-50 MG PO TABS: 1.0000 | ORAL_TABLET | Freq: Every day | ORAL | 0 refills | Status: DC

## 2023-06-13 MED ORDER — INSULIN GLARGINE-YFGN 100 UNIT/ML ~~LOC~~ SOLN
15.0000 [IU] | Freq: Every evening | SUBCUTANEOUS | Status: DC
  Filled 2023-06-13: qty 0.15

## 2023-06-13 MED ORDER — HYDRALAZINE HCL 50 MG PO TABS: 50.0000 mg | ORAL_TABLET | Freq: Two times a day (BID) | ORAL | 0 refills | Status: DC

## 2023-06-13 NOTE — Progress Notes (Signed)
Nephrology Follow-Up Consult note   Outpatient dialysis unit: [facility], home Outpatient dialysis prescription: CAPD 3 exchanges, EDW 74.1   Assessment/Recommendations: Melissa Howe is a/an 72 y.o. female with a past medical history notable for ESRD on HD admitted with volume overload   # ESRD: Volume overload in the setting of switching to peritoneal dialysis recently.  Likely insufficient ultrafiltration on PD and constipation contributing to decreased membrane efficiency -Constipation now resolved -Continue plan for CCPD 6 cycles, 1.5-hour dwell time, lower to 2 L fill volume, no day dwell - okay for dc today, still have sig volume on board but we can deal with this as outpatient - continue w/ all 4.25% bags at home until under 80kg then reassess w/ her PD team - we lowered fill volume to 2L  - she will add a 2L mid-day exchange around 4 pm (day dwell) - she will talk with the team on Monday for further recs   # Volume/ hypertension: EDW 74.1 in [facility]. Weight 91.9kg here on arrival!  Obviously volume overloaded on exam.  Improved down to 85kg here using all 4.25% bags    # Anemia of Chronic Kidney Disease: Hemoglobin around 9-10.  May improve with UF. No therapy needed at this time   # Secondary Hyperparathyroidism/Hyperphosphatemia: Hold home sensipar given lower ca. Continue home binders.   # Constipation: Now resolved miralax daily.   Barbette Hair Ludean Duhart [facility] 06/13/2023 10:05 AM  ___________________________________________________________  CC: leg swelling  Interval History/Subjective: Patient overall feels well today.  Great ultrafiltration last night with PD.  Did have some abdominal pain on her last exchange this morning.  Otherwise denies any complaints.  Edema is improved.   Medications:  Current Facility-Administered Medications  Medication Dose Route Frequency Provider Last Rate Last Admin   acetaminophen (TYLENOL) tablet 650 mg  650 mg Oral  Q6H PRN Lurline Del, MD   650 mg at 06/12/23 0148   Or   acetaminophen (TYLENOL) suppository 650 mg  650 mg Rectal Q6H PRN Lurline Del, MD       albuterol (PROVENTIL) (2.5 MG/3ML) 0.083% nebulizer solution 2.5 mg  2.5 mg Nebulization Q2H PRN Lurline Del, MD       atorvastatin (LIPITOR) tablet 80 mg  80 mg Oral QHS Skip Mayer A, MD   80 mg at 06/12/23 2132   cinacalcet (SENSIPAR) tablet 30 mg  30 mg Oral Q breakfast Darnell Level, MD   30 mg at 06/12/23 7829   dialysis solution 4.25% low-MG/low-CA dianeal solution   Intraperitoneal Q24H Silvana Newness, [facility]   New Bag at 06/10/23 1740   gentamicin cream (GARAMYCIN) 0.1 % 1 Application  1 Application Topical Daily Darnell Level, MD   1 Application at 06/10/23 1740   heparin injection 5,000 Units  5,000 Units Subcutaneous Q8H Skip Mayer A, MD   5,000 Units at 06/13/23 0503   heparin sodium (porcine) 3,000 Units in dialysis solution 4.25% low-MG/low-CA 6,000 mL dialysis solution   Peritoneal Dialysis PRN Silvana Newness, RPH       hydrALAZINE (APRESOLINE) injection 10 mg  10 mg Intravenous Q6H PRN Lurline Del, MD   10 mg at 06/12/23 2330   hydrALAZINE (APRESOLINE) tablet 50 mg  50 mg Oral BID Zannie Cove, MD   50 mg at 06/12/23 2132   insulin aspart (novoLOG) injection 0-6 Units  0-6 Units Subcutaneous Q4H Lurline Del, MD   2 Units at 06/13/23 0500  insulin glargine-yfgn (SEMGLEE) injection 15 Units  15 Units Subcutaneous QPM Zannie Cove, MD       isosorbide mononitrate (IMDUR) 24 hr tablet 30 mg  30 mg Oral Daily Crosley, Debby, MD   30 mg at 06/12/23 0833   lactulose (CHRONULAC) 10 GM/15ML solution 30 g  30 g Oral TID Skip Mayer A, MD   30 g at 06/12/23 1628   ondansetron (ZOFRAN) tablet 4 mg  4 mg Oral Q6H PRN Lurline Del, MD       Or   ondansetron [facility]) injection 4 mg  4 mg Intravenous Q6H PRN Skip Mayer A, MD   4 mg at 06/13/23 0459   polyethylene  glycol (MIRALAX / GLYCOLAX) packet 17 g  17 g Oral Daily Skip Mayer A, MD   17 g at 06/12/23 0840   potassium chloride SA (KLOR-CON M) CR tablet 40 mEq  40 mEq Oral Daily Darnell Level, MD   40 mEq at 06/12/23 5621      Review of Systems: 10 systems reviewed and negative except per interval history/subjective  Physical Exam: Vitals:   06/13/23 0545 06/13/23 0753  BP: (!) 178/67 (!) 166/62  Pulse: 88 85  Resp: (!) 22 14  Temp: 98 F (36.7 C) 98 F (36.7 C)  SpO2: 95% 93%   No intake/output data recorded.  Intake/Output Summary (Last 24 hours) at 06/13/2023 1005 Last data filed at 06/13/2023 0545 Gross per 24 hour  Intake 350 ml  Output 2035 ml  Net -1685 ml   Constitutional: well-appearing, no acute distress ENMT: ears and nose without scars or lesions, MMM CV: normal rate, 1+ edema, improving Respiratory: bilateral chest rise, mild bilat rales, on room air, no distress Gastrointestinal: soft, non-tender, no palpable masses or hernias Skin: no visible lesions or rashes Psych: alert, judgement/insight appropriate, appropriate mood and affect   Test Results I personally reviewed new and old clinical labs and radiology tests Lab Results  Component Value Date   NA 138 06/12/2023   K 3.0 (L) 06/12/2023   CL 98 06/12/2023   CO2 27 06/12/2023   BUN 41 (H) 06/12/2023   CREATININE 9.49 (H) 06/12/2023   CALCIUM 8.1 (L) 06/12/2023   ALBUMIN 3.0 (L) 06/11/2023   PHOS 3.6 06/10/2023    CBC Recent Labs  Lab 06/10/23 1133 06/11/23 0030 06/12/23 0122  WBC 7.8 7.5 6.8  HGB 8.9* 9.5* 9.6*  HCT 27.5* 30.4* 31.1*  MCV 92.9 93.5 94.5  PLT 202 230 228

## 2023-06-13 NOTE — Discharge Summary (Signed)
Physician Discharge Summary  Melissa Howe FAO:[id number] DOB: 04-06-1951 DOA: 06/09/2023  PCP: Raymon Mutton., FNP  Admit date: 06/09/2023 Discharge date: 06/13/2023  Time spent: 45 minutes  Recommendations for Outpatient Follow-up:  Continue CCPD 6 cycles with 4.25% bags at home until weight under 80 kg then reassess with her PD team [facility] in 1 to 2 weeks   Discharge Diagnoses:  Principal Problem:   Acute respiratory failure with hypoxemia (HCC) ESRD on peritoneal dialysis Volume overload   Essential hypertension   Depression   Uncontrolled type 2 diabetes mellitus with hyperglycemia, with long-term current use of insulin (HCC)   Chronic diastolic CHF (congestive heart failure) (HCC)   Moderate protein-calorie malnutrition (HCC)   [name] (coronary artery disease) of artery bypass graft   Hypervolemia associated with renal insufficiency   Discharge Condition: Improved  Diet recommendation: Renal diabetic  Filed Weights   06/11/23 0340 06/12/23 0622 06/13/23 0300  Weight: 91 kg 86.9 kg 85.1 kg    History of present illness:  72 y.o. female with past medical history  of constipation, hyperlipidemia, [name] ,  NSTEMI, ischemic cardiomyopathy congestive heart failure, ESRD on HD with recent conversion to PD 1 mo ago presented to the hospital with progressive shortness of breath dyspnea on exertion and bilateral lower extremity swelling for 1 to 2 weeks.  Patient on evaluation was found to have fluid overload with acute hypoxic respiratory failure.  In the ED nephrology was consulted and an impression of fluid overload due to recent switching to PD.  Initial labs showed hypertension with sinus bradycardia.  Hemoglobin of 9.6 with creatinine of 13.5.  Chest x-ray showed cardiomegaly with airspace opacities and distended pulmonary vasculature.   Hospital Course:   Fluid overload due to ESRD  -in setting of sub optimal ultrafiltration s/p switch from HD to PD 1  month ago and history of constipation.  -Nephrology following, dialyzed with peritoneal dialysis inpatient, volume status improving, weight down 15 LB -She is still volume overloaded however is adamant to discharge home today -Okay to DC home per nephrology team, they will continue to to attempt volume removal with PD after discharge   Uncontrolled Hypertension Likely secondary to volume overload.  Continue hydralazine, will increase dose to 50 Mg twice daily    Constipation Continue bowel regimen.  Received lactulose.  -Added Senokot at discharge   Hyperlipidemia Continue statins.   [name] s/p NSTEMI Status post PCI to LAD.  Continue Imdur aspirin Lipitor.  No active chest pain   Acute systolic CHF exacerbation.  LV ejection fraction of 35 to 40%, volume managed with PD.   -Remains volume overloaded   Anemia of chronic kidney disease.   -Stable   Diabetes mellitus type 2.   -Resume home regimen of Semglee      Discharge Exam: Vitals:   06/13/23 0545 06/13/23 0753  BP: (!) 178/67 (!) 166/62  Pulse: 88 85  Resp: (!) 22 14  Temp: 98 F (36.7 C) 98 F (36.7 C)  SpO2: 95% 93%   Gen: Awake, Alert, Oriented X 3,  HEENT: +JVD Lungs: Rare basilar Rales CVS: S1S2/RRR Abd: soft, Non tender, non distended, BS present, PD catheter Extremities: 2+ edema Skin: no new rashes on exposed skin   Discharge Instructions   Discharge Instructions     Discharge instructions   Complete by: As directed    Please follow a low-sodium, renal diabetic diet   Discharge wound care:   Complete by: As directed  Clean skin near exit site with ChloraPrep swab sticks, starting at the catheter use circular pattern around exit sites moving towards outer edges of area covered by dressing, apply gentamicin cream to site once daily, cover with dry dressing   Increase activity slowly   Complete by: As directed       Allergies as of 06/13/2023   No Known Allergies      Medication List      STOP taking these medications    metoCLOPramide 5 MG tablet Commonly known as: REGLAN   oxyCODONE-acetaminophen 5-325 MG tablet Commonly known as: Percocet       TAKE these medications    amLODipine 10 MG tablet Commonly known as: NORVASC Take 10 mg by mouth daily.   aspirin 81 MG chewable tablet Chew 1 tablet (81 mg total) by mouth daily.   atorvastatin 80 MG tablet Commonly known as: LIPITOR Take 80 mg by mouth at bedtime.   calcitRIOL 0.5 MCG capsule Commonly known as: ROCALTROL Take 5 capsules (2.5 mcg total) by mouth Every Tuesday,Thursday,and Saturday with dialysis.   cinacalcet 30 MG tablet Commonly known as: SENSIPAR Take 30 mg by mouth daily. With snack   CLEAR EYES COMPLETE OP Place 1 drop into both eyes daily as needed (dry eyes).   hydrALAZINE 50 MG tablet Commonly known as: APRESOLINE Take 1 tablet (50 mg total) by mouth 2 (two) times daily. What changed:  medication strength how much to take when to take this Another medication with the same name was removed. Continue taking this medication, and follow the directions you see here.   Insulin Glargine Solostar 100 UNIT/ML Solostar Pen Commonly known as: LANTUS Inject 20 Units as directed daily.   isosorbide mononitrate 30 MG 24 hr tablet Commonly known as: IMDUR Take 1 tablet (30 mg total) by mouth daily.   lactulose 10 GM/15ML solution Commonly known as: CHRONULAC Take 60 mLs by mouth every other day.   MIRCERA IJ Mircera   senna-docusate 8.6-50 MG tablet Commonly known as: Senokot-S Take 1 tablet by mouth at bedtime.   triamcinolone lotion 0.1 % Commonly known as: KENALOG Apply 1 Application topically 2 (two) times daily.   Velphoro 500 MG chewable tablet Generic drug: sucroferric oxyhydroxide Chew 1,000 mg by mouth See admin instructions. Take 1000 mg with each meal and 1000 mg with snacks               Discharge Care Instructions  (From admission, onward)            Start     Ordered   06/13/23 0000  Discharge wound care:       Comments: Clean skin near exit site with ChloraPrep swab sticks, starting at the catheter use circular pattern around exit sites moving towards outer edges of area covered by dressing, apply gentamicin cream to site once daily, cover with dry dressing   06/13/23 1008           No Known Allergies    The results of significant diagnostics from this hospitalization (including imaging, microbiology, ancillary and laboratory) are listed below for reference.    Significant Diagnostic Studies: DG Chest Port 1 View  Result Date: 06/09/2023 CLINICAL DATA:  Shortness of breath, bilateral swollen legs and feet. EXAM: PORTABLE CHEST 1 VIEW COMPARISON:  03/12/2023. FINDINGS: Heart is enlarged and the mediastinal contour is within normal limits. There is atherosclerotic calcification of the aorta. The pulmonary vasculature is mildly distended. Mild airspace disease is noted at the lung  bases bilaterally. There is blunting of the costophrenic angles bilaterally, possible small pleural effusions. No pneumothorax. No acute osseous abnormality. IMPRESSION: 1. Cardiomegaly with mildly distended pulmonary vasculature. 2. Mild airspace disease at the lung bases, possible atelectasis, edema, or infiltrate. 3. Suspected small bilateral pleural effusions. Electronically Signed   By: Thornell Sartorius M.D.   On: 06/09/2023 22:34    Microbiology: Recent Results (from the past 240 hour(s))  MRSA Next Gen by PCR, Nasal     Status: None   Collection Time: 06/10/23  2:53 AM   Specimen: Nasal Mucosa; Nasal Swab  Result Value Ref Range Status   MRSA by PCR Next Gen NOT DETECTED NOT DETECTED Final    Comment: (NOTE) The GeneXpert MRSA Assay (FDA approved for NASAL specimens only), is one component of a comprehensive MRSA colonization surveillance program. It is not intended to diagnose MRSA infection nor to guide or monitor treatment for MRSA  infections. Test performance is not FDA approved in patients less than 38 years old. Performed at [facility], [address], [place], [place] [address]      Labs: Basic Metabolic Panel: Recent Labs  Lab 06/09/23 2204 06/10/23 1133 06/11/23 0030 06/12/23 0122  NA 142  --  137 138  K 3.6  --  3.5 3.0*  CL 99  --  97* 98  CO2 30  --  26 27  GLUCOSE 75  --  298* 258*  BUN 55*  --  49* 41*  CREATININE 10.55* 10.53* 10.14* 9.49*  CALCIUM 8.6*  --  8.0* 8.1*  MG  --   --   --  1.9  PHOS  --  3.6  --   --    Liver Function Tests: Recent Labs  Lab 06/11/23 0030  AST 22  ALT 22  ALKPHOS 83  BILITOT 0.4  PROT 6.4*  ALBUMIN 3.0*   No results for input(s): "LIPASE", "AMYLASE" in the last 168 hours. No results for input(s): "AMMONIA" in the last 168 hours. CBC: Recent Labs  Lab 06/09/23 2133 06/10/23 1133 06/11/23 0030 06/12/23 0122  WBC 9.6 7.8 7.5 6.8  HGB 10.3* 8.9* 9.5* 9.6*  HCT 31.9* 27.5* 30.4* 31.1*  MCV 92.5 92.9 93.5 94.5  PLT 233 202 230 228   Cardiac Enzymes: No results for input(s): "CKTOTAL", "CKMB", "CKMBINDEX", "TROPONINI" in the last 168 hours. BNP: BNP (last 3 results) No results for input(s): "BNP" in the last 8760 hours.  ProBNP (last 3 results) No results for input(s): "PROBNP" in the last 8760 hours.  CBG: Recent Labs  Lab 06/12/23 1559 06/12/23 1920 06/12/23 2320 06/13/23 0308 06/13/23 0815  GLUCAP 120* 130* 261* 211* 179*       Signed:  Zannie Cove MD.  [facility] 06/13/2023, 10:08 AM

## 2023-06-13 NOTE — Progress Notes (Signed)
PD post treatment note  PD treatment completed. Patient tolerated treatment well. PD effluent is clear. No specimen collected.  PD exit site clean, dry and intact. Patient is awake, oriented and in no acute distress.  Report given to bedside nurse.   Post treatment VS: 98 - 178/67 (96) - 80 - 16 - 96%  Total UF removed: 2035 mL  Post treatment weight: 83.9 kg (standing scale)    06/13/23 0545  Peritoneal Catheter Left lower abdomen Continuous ambulatory  Placement Date/Time: 05/01/23 0812   Serial / Lot #: [id number]  Expiration Date: 06/26/27  Procedural Verification: Medical records & consent reviewed;Site marked with initials;Relevant studies,results and images reviewed  Time out: Correct Patient;Corre...  Site Assessment Clean, Dry, Intact  Drainage Description None  Catheter status Deaccessed;Clamped;Capped  Dressing Gauze/Drain sponge  Dressing Status Clean, Dry, Intact  Completion  Treatment Status Complete  Initial Drain Volume 749  Average Dwell Time-Hour(s) 1  Average Dwell Time-Min(s) 30  Average Drain Time 17  Total Therapy Volume 04540  Total Therapy Time-Hour(s) 10  Total Therapy Time-Min(s) 53  Weight after Drain 184 lb 15.5 oz (83.9 kg) (Standing scale)  Effluent Appearance Clear;Yellow  Cell Count on Daytime Exchange N/A  Fluid Balance - CCPD  Total UF (+ value on cycler, pt loss) 2035 mL  Procedure Comments  Tolerated treatment well? Yes  Peritoneal Dialysis Comments Patient sitting up in recliner chair, A&O x 4, no c/o voiced, no acute distress noted, condition stable for this treatment d/c.Marland Kitchen  Hand-off documentation  Hand-off Given Given to shift RN/LPN  Report given to (Full Name) Marc Morgans, Beather Arbour, RN

## 2023-06-26 ENCOUNTER — Encounter: Payer: Self-pay | Admitting: Endocrinology

## 2023-06-26 ENCOUNTER — Ambulatory Visit: Payer: Medicare HMO | Admitting: Endocrinology

## 2023-06-26 VITALS — BP 140/40 | HR 79 | Ht 60.0 in | Wt 174.2 lb

## 2023-06-26 DIAGNOSIS — E1169 Type 2 diabetes mellitus with other specified complication: Secondary | ICD-10-CM | POA: Diagnosis not present

## 2023-06-26 DIAGNOSIS — E1165 Type 2 diabetes mellitus with hyperglycemia: Secondary | ICD-10-CM

## 2023-06-26 DIAGNOSIS — E1141 Type 2 diabetes mellitus with diabetic mononeuropathy: Secondary | ICD-10-CM | POA: Diagnosis not present

## 2023-06-26 DIAGNOSIS — Z794 Long term (current) use of insulin: Secondary | ICD-10-CM | POA: Diagnosis not present

## 2023-06-26 DIAGNOSIS — N186 End stage renal disease: Secondary | ICD-10-CM

## 2023-06-26 DIAGNOSIS — E11319 Type 2 diabetes mellitus with unspecified diabetic retinopathy without macular edema: Secondary | ICD-10-CM

## 2023-06-26 MED ORDER — LINAGLIPTIN 5 MG PO TABS: 5.0000 mg | ORAL_TABLET | Freq: Every day | ORAL | 3 refills | Status: DC

## 2023-06-26 NOTE — Progress Notes (Signed)
Outpatient Endocrinology Note [place] Saidy Ormand, MD  06/28/23  Patient's Name: Melissa Howe    DOB: [birth year]    MRN: [id number]                                                    REASON OF VISIT: New consult for evaluation of type 2 diabetes mellitus  REFERRING PROVIDER: Arita Miss, MD  PCP: Raymon Mutton., FNP  HISTORY OF PRESENT ILLNESS:   Melissa Howe is a 72 y.o. old female with past medical history listed below, is here for evaluation of type 2 diabetes mellitus.   Pertinent Diabetes History: Patient was diagnosed with type 2 diabetes mellitus at the age of 14.  Patient is referred to oncology for the management of uncontrolled type 2 diabetes mellitus.  She used to be on metformin, glimepiride and Janumet in the past.  Chronic Diabetes Complications : Retinopathy: retinopathy, left. follow ophthalmology every 6 months. Nephropathy: ESRD , on howe dialysis, three times a week.  Peripheral neuropathy: yes, diabetic foot infection in the past Coronary artery disease: yes, has CHF Stroke: no  Relevant comorbidities and cardiovascular risk factors: Obesity: yes Body mass index is 34.02 kg/m.  Hypertension: yes Hyperlipidemia. Yes, on statin  Current / Home Diabetic regimen includes: Lantus 20 units daily in the morning.  Prior diabetic medications: Novolog makes sick.  Metformin, glimepiride and Janumet in the past.   Glycemic data:  Did not bring glucometer to download and review.  By recall she mentioned most of the morning her blood sugar around 70 or below.  Blood sugar in the afternoon around 200-300 range.  Hypoglycemia: Patient has ? hypoglycemic episodes. Patient has hypoglycemia awareness.  Factors modifying glucose control: 1.  Diabetic diet assessment: three meals a day.   2.  Staying active or exercising: no formal exercise  3.  Medication compliance: compliant all of the time.  Interval history 06/28/23 Patient presented to  establish diabetes care.  She did not bring glucometer to review data.  However she mentions she is having low blood sugar around 70 in the early morning.  Blood sugar are high during the afternoon.  She has been taking Lantus 20 units daily.  REVIEW OF SYSTEMS As per history of present illness.   PAST MEDICAL HISTORY: Past Medical History:  Diagnosis Date   Anemia    CHF (congestive heart failure) (HCC)    Chronic kidney disease    Constipation    Coronary artery disease    Diabetes mellitus    Type II   Dyspnea    with exertion   ESRD (end stage renal disease) (HCC)    History of blood transfusion    Hyperlipemia    Hypertension    Ischemic cardiomyopathy    Myocardial infarction (HCC)    Pneumonia 2019    PAST SURGICAL HISTORY: Past Surgical History:  Procedure Laterality Date   ABDOMINAL AORTOGRAM W/LOWER EXTREMITY N/A 10/15/2022   Procedure: ABDOMINAL AORTOGRAM W/LOWER EXTREMITY;  Surgeon: Victorino Sparrow, MD;  Location: [facility] INVASIVE CV LAB;  Service: Cardiovascular;  Laterality: N/A;   APPENDECTOMY     AV FISTULA PLACEMENT Left 10/05/2019   Procedure: INSERTION OF ARTERIOVENOUS (AV) GORE-TEX VASCULAR GRAFT LEFT ARM;  Surgeon: Larina Earthly, MD;  Location: [facility] OR;  Service: Vascular;  Laterality: Left;  AV FISTULA PLACEMENT Left 02/27/2021   Procedure: LEFT ARM FIRST STAGE BASILIC VEIN  ARTERIOVENOUS (AV) FISTULA CREATION;  Surgeon: Maeola Harman, MD;  Location: [facility] OR;  Service: Vascular;  Laterality: Left;   BASCILIC VEIN TRANSPOSITION Left 04/17/2021   Procedure: LEFT SECOND STAGE BASCILIC VEIN TRANSPOSITION;  Surgeon: Maeola Harman, MD;  Location: [facility] OR;  Service: Vascular;  Laterality: Left;   BIOPSY  07/24/2020   Procedure: BIOPSY;  Surgeon: Sherrilyn Rist, MD;  Location: [facility] ENDOSCOPY;  Service: Gastroenterology;;   BUBBLE STUDY  01/02/2020   Procedure: BUBBLE STUDY;  Surgeon: Parke Poisson, MD;  Location: [facility] ENDOSCOPY;  Service:  Cardiology;;   CAPD INSERTION N/A 05/01/2023   Procedure: LAPAROSCOPIC INSERTION CONTINUOUS AMBULATORY PERITONEAL DIALYSIS  (CAPD) CATHETER;  Surgeon: Maeola Harman, MD;  Location: [facility] OR;  Service: Vascular;  Laterality: N/A;   CARDIAC CATHETERIZATION     COLONOSCOPY WITH PROPOFOL N/A 07/24/2020   Procedure: COLONOSCOPY WITH PROPOFOL;  Surgeon: Sherrilyn Rist, MD;  Location: [facility] ENDOSCOPY;  Service: Gastroenterology;  Laterality: N/A;   CORONARY STENT INTERVENTION N/A 07/12/2021   Procedure: CORONARY STENT INTERVENTION;  Surgeon: Corky Crafts, MD;  Location: [facility] INVASIVE CV LAB;  Service: Cardiovascular;  Laterality: N/A;   CORONARY ULTRASOUND/IVUS N/A 07/12/2021   Procedure: Intravascular Ultrasound/IVUS;  Surgeon: Corky Crafts, MD;  Location: [facility] INVASIVE CV LAB;  Service: Cardiovascular;  Laterality: N/A;   ESOPHAGOGASTRODUODENOSCOPY (EGD) WITH PROPOFOL N/A 07/24/2020   Procedure: ESOPHAGOGASTRODUODENOSCOPY (EGD) WITH PROPOFOL;  Surgeon: Sherrilyn Rist, MD;  Location: [facility] ENDOSCOPY;  Service: Gastroenterology;  Laterality: N/A;   EYE SURGERY Bilateral    Cataract   FOREIGN BODY REMOVAL Left 05/13/2018   Procedure: FOREIGN BODY REMOVAL left foot;  Surgeon: Cammy Copa, MD;  Location: [facility] OR;  Service: Orthopedics;  Laterality: Left;   I & D EXTREMITY Left 05/21/2018   Procedure: LEFT FOOT DEBRIDEMENT AND WOUND CLOSURE;  Surgeon: Nadara Mustard, MD;  Location: [facility] OR;  Service: Orthopedics;  Laterality: Left;   LEFT HEART CATH AND CORONARY ANGIOGRAPHY N/A 07/12/2021   Procedure: LEFT HEART CATH AND CORONARY ANGIOGRAPHY;  Surgeon: Corky Crafts, MD;  Location: [facility] INVASIVE CV LAB;  Service: Cardiovascular;  Laterality: N/A;   POLYPECTOMY  07/24/2020   Procedure: POLYPECTOMY;  Surgeon: Sherrilyn Rist, MD;  Location: [facility] ENDOSCOPY;  Service: Gastroenterology;;   TEE WITHOUT CARDIOVERSION N/A 01/02/2020   Procedure: TRANSESOPHAGEAL ECHOCARDIOGRAM  (TEE);  Surgeon: Parke Poisson, MD;  Location: [facility] ENDOSCOPY;  Service: Cardiology;  Laterality: N/A;   TONSILLECTOMY     TUBAL LIGATION      ALLERGIES: No Known Allergies  FAMILY HISTORY:  Family History  Problem Relation Age of Onset   Hypertension Mother    Diabetes Mother    Hypertension Sister     SOCIAL HISTORY: Social History   Socioeconomic History   Marital status: Divorced    Spouse name: Not on file   Number of children: Not on file   Years of education: Not on file   Highest education level: Not on file  Occupational History   Occupation: Retired  Tobacco Use   Smoking status: Former    Current packs/day: 0.00    Types: Cigarettes    Start date: 01/19/1980    Quit date: 01/19/1984    Years since quitting: 39.4    Passive exposure: Never   Smokeless tobacco: Never  Vaping Use   Vaping status: Never Used  Substance  and Sexual Activity   Alcohol use: No   Drug use: No   Sexual activity: Not on file  Other Topics Concern   Not on file  Social History Narrative   Lives in [place] with her dtr.  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  Recently moved from disability to "retired" when she turned 54.  Sedentary.  Knits frequently for fun.   Social Determinants of Health   Financial Resource Strain: Not on file  Food Insecurity: No Food Insecurity (06/10/2023)   Hunger Vital Sign    Worried About Running Out of Food in the Last Year: Never true    Ran Out of Food in the Last Year: Never true  Transportation Needs: No Transportation Needs (06/10/2023)   PRAPARE - Administrator, Civil Service (Medical): No    Lack of Transportation (Non-Medical): No  Physical Activity: Unknown (08/06/2019)   Exercise Vital Sign    Days of Exercise per Week: Patient declined    Minutes of Exercise per Session: Patient declined  Stress: No Stress Concern Present (08/06/2019)   [employer] of Occupational Health -  Occupational Stress Questionnaire    Feeling of Stress : Only a little  Social Connections: Unknown (12/15/2022)   Received from [facility]   Social Network    Social Network: Not on file    MEDICATIONS:  Current Outpatient Medications  Medication Sig Dispense Refill   amLODipine (NORVASC) 10 MG tablet Take 10 mg by mouth daily.     aspirin 81 MG chewable tablet Chew 1 tablet (81 mg total) by mouth daily. 30 tablet 1   atorvastatin (LIPITOR) 80 MG tablet Take 80 mg by mouth at bedtime.     calcitRIOL (ROCALTROL) 0.5 MCG capsule Take 5 capsules (2.5 mcg total) by mouth Every Tuesday,Thursday,and Saturday with dialysis. 60 capsule 2   cinacalcet (SENSIPAR) 30 MG tablet Take 30 mg by mouth daily. With snack     Hyprom-Naphaz-Polysorb-Zn Sulf (CLEAR EYES COMPLETE OP) Place 1 drop into both eyes daily as needed (dry eyes).     Insulin Glargine Solostar (LANTUS) 100 UNIT/ML Solostar Pen Inject 20 Units as directed daily.     insulin lispro (HUMALOG KWIKPEN) 100 UNIT/ML KwikPen take humalog 4 units with meals three times a day plus sliding scale.  Mild Sliding Scale Blood Glucose        Insulin 60-150                     None 151-200                   None 201-250                   2 units 251-300                   4 units 301-350                   6 units 351-400                   8 units      >400                        9 units and call provider 15 mL 11   Insulin Pen Needle 32G X 4 MM MISC Use 4x a day 300 each 3   lactulose (CHRONULAC) 10  GM/15ML solution Take 60 mLs by mouth every other day.     linagliptin (TRADJENTA) 5 MG TABS tablet Take 1 tablet (5 mg total) by mouth daily. 90 tablet 3   senna-docusate (SENOKOT-S) 8.6-50 MG tablet Take 1 tablet by mouth at bedtime. 90 tablet 0   triamcinolone lotion (KENALOG) 0.1 % Apply 1 Application topically 2 (two) times daily.     hydrALAZINE (APRESOLINE) 50 MG tablet Take 1 tablet (50 mg total) by mouth 2 (two) times daily. (Patient not  taking: Reported on 06/26/2023) 60 tablet 0   isosorbide mononitrate (IMDUR) 30 MG 24 hr tablet Take 1 tablet (30 mg total) by mouth daily. (Patient not taking: Reported on 06/26/2023) 30 tablet 1   Methoxy PEG-Epoetin Beta (MIRCERA IJ) Mircera (Patient not taking: Reported on 06/12/2023)     sucroferric oxyhydroxide (VELPHORO) 500 MG chewable tablet Chew 1,000 mg by mouth See admin instructions. Take 1000 mg with each meal and 1000 mg with snacks (Patient not taking: Reported on 06/26/2023)     No current facility-administered medications for this visit.    PHYSICAL EXAM: Vitals:   06/26/23 0940  BP: (!) 140/40  Pulse: 79  SpO2: 99%  Weight: 174 lb 3.2 oz (79 kg)  Height: 5' (1.524 m)   Body mass index is 34.02 kg/m.  Wt Readings from Last 3 Encounters:  06/26/23 174 lb 3.2 oz (79 kg)  06/13/23 187 lb 9.8 oz (85.1 kg)  05/12/23 168 lb 6.4 oz (76.4 kg)    General: Well developed, well nourished female in no apparent distress.  HEENT: AT/, no external lesions.  Eyes: Conjunctiva clear and no icterus. Neck: Neck supple  Lungs: Respirations not labored Neurologic: Alert, oriented, normal speech Extremities / Skin: Dry. No sores or rashes noted.  Psychiatric: Does not appear depressed or anxious  Diabetic Foot Exam - Simple   No data filed    LABS Reviewed Lab Results  Component Value Date   HGBA1C 8.1 (H) 06/10/2023   HGBA1C 9.9 (H) 10/08/2022   HGBA1C 8.8 (H) 07/09/2021   No results found for: "FRUCTOSAMINE" Lab Results  Component Value Date   CHOL 209 (H) 07/10/2021   HDL 41 07/10/2021   LDLCALC 145 (H) 07/10/2021   TRIG 120 07/10/2021   TRIG 115 07/10/2021   CHOLHDL 5.1 07/10/2021   No results found for: "MICRALBCREAT" Lab Results  Component Value Date   CREATININE 9.49 (H) 06/12/2023   No results found for: "GFR"  ASSESSMENT / PLAN  1. Type 2 diabetes mellitus with other specified complication, with long-term current use of insulin (HCC)   2.  Uncontrolled type 2 diabetes mellitus with hyperglycemia (HCC)   3. Uncontrolled type 2 diabetes mellitus with hyperglycemia, with long-term current use of insulin (HCC)   4. Diabetic mononeuropathy associated with type 2 diabetes mellitus (HCC)   5. Diabetic retinopathy associated with type 2 diabetes mellitus, macular edema presence unspecified, unspecified laterality, unspecified retinopathy severity (HCC)   6. ESRD (end stage renal disease) (HCC)     Diabetes Mellitus type 2, complicated by diabetic retinopathy/neuropathy and nephropathy. - Diabetic status / severity: Uncontrolled.  Lab Results  Component Value Date   HGBA1C 8.1 (H) 06/10/2023    - Hemoglobin A1c goal : <7%  With a concern of hypoglycemia in the early morning I would like to decrease basal insulin and add mealtime insulin.  Adjusted diabetes regimen as follows.  - Medications:  Diabetes regimen: Decrease lantus to 15 units daily. Start Trajdenta 5 mg  daily. Start humalog 4 units with meals three times a day plus sliding scale.  Mild Sliding Scale Blood Glucose        Insulin 60-150                     None 151-200                   None 201-250                   2 units 251-300                   4 units 301-350                   6 units 351-400                   8 units      >400                        9 units and call provider   - Home glucose testing: 3-4 times a day,before meals and bedtime.  - Discussed/ Gave Hypoglycemia treatment plan.  # Consult : not required at this time.   #ESRD on hemodialysis, following with nephrology. # Foot check nightly / neuropathy.  # She has diabetic retinopathy follow-up with ophthalmology..   - Diet: Eat reasonable portion sizes to promote a healthy weight  2. Blood pressure  -  BP Readings from Last 1 Encounters:  06/26/23 (!) 140/40    - Control is in target.  - No change in current plans.  3. Lipid status / Hyperlipidemia - Last  Lab Results   Component Value Date   LDLCALC 145 (H) 07/10/2021   - Continue atorvastatin 80 mg daily.    Diagnoses and all orders for this visit:  Type 2 diabetes mellitus with other specified complication, with long-term current use of insulin (HCC)  Uncontrolled type 2 diabetes mellitus with hyperglycemia (HCC)  Uncontrolled type 2 diabetes mellitus with hyperglycemia, with long-term current use of insulin (HCC)  Diabetic mononeuropathy associated with type 2 diabetes mellitus (HCC)  Diabetic retinopathy associated with type 2 diabetes mellitus, macular edema presence unspecified, unspecified laterality, unspecified retinopathy severity (HCC)  ESRD (end stage renal disease) (HCC)  Other orders -     linagliptin (TRADJENTA) 5 MG TABS tablet; Take 1 tablet (5 mg total) by mouth daily. -     Insulin Pen Needle 32G X 4 MM MISC; Use 4x a day -     insulin lispro (HUMALOG KWIKPEN) 100 UNIT/ML KwikPen; take humalog 4 units with meals three times a day plus sliding scale.  Mild Sliding Scale Blood Glucose        Insulin 60-150                     None 151-200                   None 201-250                   2 units 251-300                   4 units 301-350                   6 units 351-400  8 units      >400                        9 units and call provider    DISPOSITION Follow up in clinic in 2 months suggested.   All questions answered and patient verbalized understanding of the plan.  [place] Karley Pho, MD [facility] [address] [place], [address] [place], [place] [address] Phone # [phone]  At least part of this note was generated using voice recognition software. Inadvertent word errors may have occurred, which were not recognized during the proofreading process.

## 2023-06-26 NOTE — Patient Instructions (Signed)
Diabetes regimen: Decrease lantus to 15 units daily. Start Trajdenta 5 mg daily. Start humalog 4 units with meals three times a day plus sliding scale.  Mild Sliding Scale Blood Glucose        Insulin 60-150                     None 151-200                   None 201-250                   2 units 251-300                   4 units 301-350                   6 units 351-400                   8 units      >400                        9 units and call provider

## 2023-06-28 ENCOUNTER — Encounter: Payer: Self-pay | Admitting: Endocrinology

## 2023-06-28 MED ORDER — INSULIN PEN NEEDLE 32G X 4 MM MISC: 3 refills | Status: DC

## 2023-06-28 MED ORDER — INSULIN LISPRO (1 UNIT DIAL) 100 UNIT/ML (KWIKPEN): PEN_INJECTOR | SUBCUTANEOUS | 11 refills | Status: DC

## 2023-08-17 ENCOUNTER — Encounter (HOSPITAL_BASED_OUTPATIENT_CLINIC_OR_DEPARTMENT_OTHER): Payer: Self-pay | Admitting: Urology

## 2023-08-17 ENCOUNTER — Emergency Department (HOSPITAL_BASED_OUTPATIENT_CLINIC_OR_DEPARTMENT_OTHER)
Admission: EM | Admit: 2023-08-17 | Discharge: 2023-08-17 | Disposition: A | Discharge status: Home / Self Care | Payer: Medicare HMO | Attending: Emergency Medicine | Admitting: Emergency Medicine

## 2023-08-17 ENCOUNTER — Other Ambulatory Visit: Payer: Self-pay

## 2023-08-17 DIAGNOSIS — R600 Localized edema: Secondary | ICD-10-CM | POA: Diagnosis not present

## 2023-08-17 DIAGNOSIS — Z7982 Long term (current) use of aspirin: Secondary | ICD-10-CM | POA: Diagnosis not present

## 2023-08-17 DIAGNOSIS — Z20822 Contact with and (suspected) exposure to covid-19: Secondary | ICD-10-CM | POA: Diagnosis not present

## 2023-08-17 DIAGNOSIS — J069 Acute upper respiratory infection, unspecified: Secondary | ICD-10-CM | POA: Insufficient documentation

## 2023-08-17 DIAGNOSIS — R059 Cough, unspecified: Secondary | ICD-10-CM | POA: Diagnosis present

## 2023-08-17 LAB — RESP PANEL BY RT-PCR (RSV, FLU A&B, COVID)  RVPGX2
Influenza A by PCR: NEGATIVE
Influenza B by PCR: NEGATIVE
Resp Syncytial Virus by PCR: NEGATIVE
SARS Coronavirus 2 by RT PCR: NEGATIVE

## 2023-08-17 MED ORDER — BENZONATATE 100 MG PO CAPS: 100.0000 mg | ORAL_CAPSULE | Freq: Three times a day (TID) | ORAL | 0 refills | Status: DC

## 2023-08-17 MED ORDER — ONDANSETRON 4 MG PO TBDP: ORAL_TABLET | ORAL | 0 refills | Status: DC

## 2023-08-17 MED ORDER — DOXYCYCLINE HYCLATE 100 MG PO CAPS: 100.0000 mg | ORAL_CAPSULE | Freq: Two times a day (BID) | ORAL | 0 refills | Status: DC

## 2023-08-17 NOTE — ED Triage Notes (Signed)
Pt states dry hacking cough x 2 weeks  Denies fever  Reports runny nose as well    H/o peritoneal dialysis

## 2023-08-17 NOTE — ED Provider Notes (Signed)
[place] EMERGENCY DEPARTMENT AT [facility] Provider Note   CSN: [id number] Arrival date & time: 08/17/23  1145     History  Chief Complaint  Patient presents with   Cough    Melissa Howe is a 72 y.o. female.  72 yo F with a chief complaints of cough.  This been going on for a couple weeks.  Noted sick contacts no recent travel.  Patient currently on home dialysis.  Denies any issues with it.  Was told by her daughter the thought her face looked a little bit swollen today.  She denies significant edema otherwise.  No difficulty eating or drinking.  Cough is worse when she lays back flat.   Cough      Home Medications Prior to Admission medications   Medication Sig Start Date End Date Taking? Authorizing Provider  benzonatate (TESSALON) 100 MG capsule Take 1 capsule (100 mg total) by mouth every 8 (eight) hours. 08/17/23  Yes Melene Plan, DO  doxycycline (VIBRAMYCIN) 100 MG capsule Take 1 capsule (100 mg total) by mouth 2 (two) times daily. One po bid x 7 days 08/17/23  Yes Melene Plan, DO  ondansetron (ZOFRAN-ODT) 4 MG disintegrating tablet 4mg  ODT q4 hours prn nausea/vomit 08/17/23  Yes Adela Lank, Kailoni Vahle, DO  amLODipine (NORVASC) 10 MG tablet Take 10 mg by mouth daily.    [provider]  aspirin 81 MG chewable tablet Chew 1 tablet (81 mg total) by mouth daily. 07/13/21   Ghimire, Werner Lean, MD  atorvastatin (LIPITOR) 80 MG tablet Take 80 mg by mouth at bedtime.    [provider]  calcitRIOL (ROCALTROL) 0.5 MCG capsule Take 5 capsules (2.5 mcg total) by mouth Every Tuesday,Thursday,and Saturday with dialysis. 07/24/20   Pokhrel, Rebekah Chesterfield, MD  cinacalcet (SENSIPAR) 30 MG tablet Take 30 mg by mouth daily. With snack    [provider]  hydrALAZINE (APRESOLINE) 50 MG tablet Take 1 tablet (50 mg total) by mouth 2 (two) times daily. Patient not taking: Reported on 06/26/2023 06/13/23   Zannie Cove, MD  Hyprom-Naphaz-Polysorb-Zn Sulf (CLEAR EYES  COMPLETE OP) Place 1 drop into both eyes daily as needed (dry eyes).    [provider]  Insulin Glargine Solostar (LANTUS) 100 UNIT/ML Solostar Pen Inject 20 Units as directed daily. 11/11/22   [provider]  insulin lispro (HUMALOG KWIKPEN) 100 UNIT/ML KwikPen take humalog 4 units with meals three times a day plus sliding scale.  Mild Sliding Scale Blood Glucose        Insulin 60-150                     None 151-200                   None 201-250                   2 units 251-300                   4 units 301-350                   6 units 351-400                   8 units      >400                        9 units and call provider 06/28/23   Thapa, [place], MD  Insulin Pen Needle 32G X 4 MM MISC Use 4x a day 06/28/23   Thapa, [place], MD  isosorbide mononitrate (IMDUR) 30 MG 24 hr tablet Take 1 tablet (30 mg total) by mouth daily. Patient not taking: Reported on 06/26/2023 07/13/21   Maretta Bees, MD  lactulose (CHRONULAC) 10 GM/15ML solution Take 60 mLs by mouth every other day. 05/28/23   [provider]  linagliptin (TRADJENTA) 5 MG TABS tablet Take 1 tablet (5 mg total) by mouth daily. 06/26/23   Thapa, [place], MD  Methoxy PEG-Epoetin Beta (MIRCERA IJ) Mircera Patient not taking: Reported on 06/12/2023 12/11/22 12/10/23  [provider]  senna-docusate (SENOKOT-S) 8.6-50 MG tablet Take 1 tablet by mouth at bedtime. 06/13/23   Zannie Cove, MD  sucroferric oxyhydroxide (VELPHORO) 500 MG chewable tablet Chew 1,000 mg by mouth See admin instructions. Take 1000 mg with each meal and 1000 mg with snacks Patient not taking: Reported on 06/26/2023    [provider]  triamcinolone lotion (KENALOG) 0.1 % Apply 1 Application topically 2 (two) times daily.    [provider]      Allergies    Patient has no known allergies.    Review of Systems   Review of Systems  Respiratory:  Positive for cough.     Physical Exam Updated Vital Signs BP  (!) 157/86 (BP Location: Right Arm)   Pulse 86   Temp 97.6 F (36.4 C) (Oral)   Resp 19   Ht 5' (1.524 m)   Wt 79 kg   SpO2 100%   BMI 34.01 kg/m  Physical Exam Vitals and nursing note reviewed.  Constitutional:      General: She is not in acute distress.    Appearance: She is well-developed. She is not diaphoretic.  HENT:     Head: Normocephalic and atraumatic.     Comments: Swollen turbinates, posterior nasal drip, no noted sinus ttp, tm normal bilaterally.   Eyes:     Pupils: Pupils are equal, round, and reactive to light.  Cardiovascular:     Rate and Rhythm: Normal rate and regular rhythm.     Heart sounds: No murmur heard.    No friction rub. No gallop.  Pulmonary:     Effort: Pulmonary effort is normal.     Breath sounds: No wheezing or rales.  Abdominal:     General: There is no distension.     Palpations: Abdomen is soft.     Tenderness: There is no abdominal tenderness.  Musculoskeletal:        General: No tenderness.     Cervical back: Normal range of motion and neck supple.     Right lower leg: Edema present.     Left lower leg: Edema present.     Comments: 2+ edema to bilateral lower extremities up to the thighs  Skin:    General: Skin is warm and dry.  Neurological:     Mental Status: She is alert and oriented to person, place, and time.  Psychiatric:        Behavior: Behavior normal.     ED Results / Procedures / Treatments   Labs (all labs ordered are listed, but only abnormal results are displayed) Labs Reviewed  RESP PANEL BY RT-PCR (RSV, FLU A&B, COVID)  [id number]    EKG None  Radiology No results found.  Procedures Procedures    Medications Ordered in ED Medications - No data to display  ED Course/ Medical Decision Making/ A&P  Medical Decision Making Risk Prescription drug management.   72 yo F with a chief complaints of cough and congestion.  Going on for the past couple weeks.  Most likely  viral syndrome by history and physical.  Patient does hemodialysis at home, feels like she has had some increased swelling.  I did offer her laboratory evaluation as well as a chest x-ray.  She is declining at this time.  Will like to go home.  To follow-up with her doctor.   12:54 PM:  I have discussed the diagnosis/risks/treatment options with the patient.  Evaluation and diagnostic testing in the emergency department does not suggest an emergent condition requiring admission or immediate intervention beyond what has been performed at this time.  They will follow up with PCP. We also discussed returning to the ED immediately if new or worsening sx occur. We discussed the sx which are most concerning (e.g., sudden worsening pain, fever, inability to tolerate by mouth) that necessitate immediate return. Medications administered to the patient during their visit and any new prescriptions provided to the patient are listed below.  Medications given during this visit Medications - No data to display   The patient appears reasonably screen and/or stabilized for discharge and I doubt any other medical condition or other [facility] requiring further screening, evaluation, or treatment in the ED at this time prior to discharge.         Final Clinical Impression(s) / ED Diagnoses Final diagnoses:  Viral URI with cough    Rx / DC Orders ED Discharge Orders          Ordered    doxycycline (VIBRAMYCIN) 100 MG capsule  2 times daily        08/17/23 1239    benzonatate (TESSALON) 100 MG capsule  Every 8 hours        08/17/23 1239    ondansetron (ZOFRAN-ODT) 4 MG disintegrating tablet        08/17/23 1239              Melene Plan, DO 08/17/23 1254

## 2023-08-17 NOTE — Discharge Instructions (Signed)
Please follow with your family doctor in the office.  Please return for worsening symptoms difficulty breathing if you develop a fever.

## 2023-08-22 NOTE — Progress Notes (Deleted)
Cardiology Office Note:  .   Date:  08/22/2023  ID:  Melissa Howe, DOB 07-01-1951, MRN [id number] PCP: Raymon Mutton., FNP  [facility] Providers Cardiologist:  Reatha Harps, MD { Click to update primary MD,subspecialty MD or APP then REFRESH:1}   History of Present Illness: .   Melissa Howe is a 72 y.o. female with history of CHF, ESRD, [name] who presents for follow-up.      Problem List [name] -NSTEMI 06/2021 -PCI to pLAD 2. HLD 3. Ischemic Cardiomyopathy  -EF 35-40% 06/2021 -EF 60-65% 03/2023 4. ESRD on HD 5. DM -A1c 9.9    ROS: All other ROS reviewed and negative. Pertinent positives noted in the HPI.     Studies Reviewed: Marland Kitchen       Physical Exam:   VS:  There were no vitals taken for this visit.   Wt Readings from Last 3 Encounters:  08/17/23 174 lb 2.6 oz (79 kg)  06/26/23 174 lb 3.2 oz (79 kg)  06/13/23 187 lb 9.8 oz (85.1 kg)    GEN: Well nourished, well developed in no acute distress NECK: No JVD; No carotid bruits CARDIAC: ***RRR, no murmurs, rubs, gallops RESPIRATORY:  Clear to auscultation without rales, wheezing or rhonchi  ABDOMEN: Soft, non-tender, non-distended EXTREMITIES:  No edema; No deformity  ASSESSMENT AND PLAN: .   ***    {Are you ordering a CV Procedure (e.g. stress test, cath, DCCV, TEE, etc)?   Press F2        :[id number]}   Follow-up: No follow-ups on file.  Time Spent with Patient: I have spent a total of *** minutes with patient reviewing hospital notes, telemetry, EKGs, labs and examining the patient as well as establishing an assessment and plan that was discussed with the patient.  > 50% of time was spent in direct patient care.  Signed, Lenna Gilford. Flora Lipps, MD, [facility]  [facility]  [address], [address] [place], [place] [address] [phone]  10:48 AM

## 2023-08-24 ENCOUNTER — Ambulatory Visit: Payer: Medicare HMO | Admitting: Cardiovascular Disease

## 2023-08-24 DIAGNOSIS — I5022 Chronic systolic (congestive) heart failure: Secondary | ICD-10-CM

## 2023-08-24 DIAGNOSIS — E782 Mixed hyperlipidemia: Secondary | ICD-10-CM

## 2023-08-24 DIAGNOSIS — I2510 Atherosclerotic heart disease of native coronary artery without angina pectoris: Secondary | ICD-10-CM

## 2023-08-24 DIAGNOSIS — I10 Essential (primary) hypertension: Secondary | ICD-10-CM

## 2023-08-26 ENCOUNTER — Ambulatory Visit: Payer: Medicare HMO | Admitting: Podiatry

## 2023-08-27 ENCOUNTER — Ambulatory Visit: Payer: Medicare HMO | Admitting: Endocrinology

## 2023-09-22 ENCOUNTER — Other Ambulatory Visit: Payer: Self-pay

## 2023-09-22 ENCOUNTER — Emergency Department (HOSPITAL_BASED_OUTPATIENT_CLINIC_OR_DEPARTMENT_OTHER)
Admission: EM | Admit: 2023-09-22 | Discharge: 2023-09-22 | Disposition: A | Discharge status: Home / Self Care | Payer: Medicare HMO | Attending: Emergency Medicine | Admitting: Emergency Medicine

## 2023-09-22 ENCOUNTER — Emergency Department (HOSPITAL_BASED_OUTPATIENT_CLINIC_OR_DEPARTMENT_OTHER): Payer: Medicare HMO | Admitting: Radiology

## 2023-09-22 ENCOUNTER — Emergency Department (HOSPITAL_BASED_OUTPATIENT_CLINIC_OR_DEPARTMENT_OTHER): Payer: Medicare HMO

## 2023-09-22 ENCOUNTER — Encounter (HOSPITAL_BASED_OUTPATIENT_CLINIC_OR_DEPARTMENT_OTHER): Payer: Self-pay | Admitting: Emergency Medicine

## 2023-09-22 DIAGNOSIS — Z7982 Long term (current) use of aspirin: Secondary | ICD-10-CM | POA: Insufficient documentation

## 2023-09-22 DIAGNOSIS — Z794 Long term (current) use of insulin: Secondary | ICD-10-CM | POA: Insufficient documentation

## 2023-09-22 DIAGNOSIS — Z7984 Long term (current) use of oral hypoglycemic drugs: Secondary | ICD-10-CM | POA: Insufficient documentation

## 2023-09-22 DIAGNOSIS — I120 Hypertensive chronic kidney disease with stage 5 chronic kidney disease or end stage renal disease: Secondary | ICD-10-CM | POA: Insufficient documentation

## 2023-09-22 DIAGNOSIS — E1122 Type 2 diabetes mellitus with diabetic chronic kidney disease: Secondary | ICD-10-CM | POA: Diagnosis not present

## 2023-09-22 DIAGNOSIS — W0110XA Fall on same level from slipping, tripping and stumbling with subsequent striking against unspecified object, initial encounter: Secondary | ICD-10-CM | POA: Diagnosis not present

## 2023-09-22 DIAGNOSIS — N186 End stage renal disease: Secondary | ICD-10-CM | POA: Insufficient documentation

## 2023-09-22 DIAGNOSIS — R42 Dizziness and giddiness: Secondary | ICD-10-CM | POA: Diagnosis present

## 2023-09-22 DIAGNOSIS — N189 Chronic kidney disease, unspecified: Secondary | ICD-10-CM

## 2023-09-22 DIAGNOSIS — Z79899 Other long term (current) drug therapy: Secondary | ICD-10-CM | POA: Insufficient documentation

## 2023-09-22 LAB — CBC
HCT: 42 % (ref 36.0–46.0)
Hemoglobin: 13.3 g/dL (ref 12.0–15.0)
MCH: 29.7 pg (ref 26.0–34.0)
MCHC: 31.7 g/dL (ref 30.0–36.0)
MCV: 93.8 fL (ref 80.0–100.0)
Platelets: 238 10*3/uL (ref 150–400)
RBC: 4.48 MIL/uL (ref 3.87–5.11)
RDW: 15 % (ref 11.5–15.5)
WBC: 11.6 10*3/uL — ABNORMAL HIGH (ref 4.0–10.5)
nRBC: 0.3 % — ABNORMAL HIGH (ref 0.0–0.2)

## 2023-09-22 LAB — BASIC METABOLIC PANEL
Anion gap: 10 (ref 5–15)
BUN: 41 mg/dL — ABNORMAL HIGH (ref 8–23)
CO2: 32 mmol/L (ref 22–32)
Calcium: 9.3 mg/dL (ref 8.9–10.3)
Chloride: 94 mmol/L — ABNORMAL LOW (ref 98–111)
Creatinine, Ser: 8.76 mg/dL — ABNORMAL HIGH (ref 0.44–1.00)
GFR, Estimated: 4 mL/min — ABNORMAL LOW (ref >60)
Glucose, Bld: 151 mg/dL — ABNORMAL HIGH (ref 70–99)
Potassium: 3.3 mmol/L — ABNORMAL LOW (ref 3.5–5.1)
Sodium: 136 mmol/L (ref 135–145)

## 2023-09-22 NOTE — ED Triage Notes (Signed)
C/o dizziness x 1 month. Patient stated the dizziness got increasingly worse today and patient fell and struck back of head. Takes asa daily. Denies any worsening symptoms after fall.

## 2023-09-22 NOTE — ED Provider Notes (Signed)
[facility] Provider Note   CSN: [id number] Arrival date & time: 09/22/23  1744     History  Chief Complaint  Patient presents with   Melissa Howe is a 72 y.o. female.   Fall     Patient has a history of hypertension hyperlipidemia diabetes, chronic kidney disease, constipation myocardial infarction, end-stage renal disease.  Patient presents to the ED with complaints of dizziness and gait difficulty.  Patient states she has been having these symptoms for a couple of months.  She feels like her gait has been a bit off.  Patient states in the last month or so she has been having episodes of dizziness where she feels the room spinning.  Today she had an episode and ended up falling and striking her head.  Patient was able to get up and walk.  She denies any serious injury.  She is not having severe headache.  She is not have any chest pain or abdominal pain.  No blood in her stool.  Patient has not seen anyone for this complaint until today  Home Medications Prior to Admission medications   Medication Sig Start Date End Date Taking? Authorizing Provider  amLODipine (NORVASC) 10 MG tablet Take 10 mg by mouth daily.    [provider]  aspirin 81 MG chewable tablet Chew 1 tablet (81 mg total) by mouth daily. 07/13/21   Ghimire, Werner Lean, MD  atorvastatin (LIPITOR) 80 MG tablet Take 80 mg by mouth at bedtime.    [provider]  benzonatate (TESSALON) 100 MG capsule Take 1 capsule (100 mg total) by mouth every 8 (eight) hours. 08/17/23   Melene Plan, DO  calcitRIOL (ROCALTROL) 0.5 MCG capsule Take 5 capsules (2.5 mcg total) by mouth Every Tuesday,Thursday,and Saturday with dialysis. 07/24/20   Pokhrel, Rebekah Chesterfield, MD  cinacalcet (SENSIPAR) 30 MG tablet Take 30 mg by mouth daily. With snack    [provider]  doxycycline (VIBRAMYCIN) 100 MG capsule Take 1 capsule (100 mg total) by mouth 2 (two) times daily. One  po bid x 7 days 08/17/23   Melene Plan, DO  hydrALAZINE (APRESOLINE) 50 MG tablet Take 1 tablet (50 mg total) by mouth 2 (two) times daily. Patient not taking: Reported on 06/26/2023 06/13/23   Zannie Cove, MD  Hyprom-Naphaz-Polysorb-Zn Sulf (CLEAR EYES COMPLETE OP) Place 1 drop into both eyes daily as needed (dry eyes).    [provider]  Insulin Glargine Solostar (LANTUS) 100 UNIT/ML Solostar Pen Inject 20 Units as directed daily. 11/11/22   [provider]  insulin lispro (HUMALOG KWIKPEN) 100 UNIT/ML KwikPen take humalog 4 units with meals three times a day plus sliding scale.  Mild Sliding Scale Blood Glucose        Insulin 60-150                     None 151-200                   None 201-250                   2 units 251-300                   4 units 301-350                   6 units 351-400  8 units      >400                        9 units and call provider 06/28/23   Thapa, [place], MD  Insulin Pen Needle 32G X 4 MM MISC Use 4x a day 06/28/23   Thapa, [place], MD  isosorbide mononitrate (IMDUR) 30 MG 24 hr tablet Take 1 tablet (30 mg total) by mouth daily. Patient not taking: Reported on 06/26/2023 07/13/21   Maretta Bees, MD  lactulose (CHRONULAC) 10 GM/15ML solution Take 60 mLs by mouth every other day. 05/28/23   [provider]  linagliptin (TRADJENTA) 5 MG TABS tablet Take 1 tablet (5 mg total) by mouth daily. 06/26/23   Thapa, [place], MD  Methoxy PEG-Epoetin Beta (MIRCERA IJ) Mircera Patient not taking: Reported on 06/12/2023 12/11/22 12/10/23  [provider]  ondansetron (ZOFRAN-ODT) 4 MG disintegrating tablet 4mg  ODT q4 hours prn nausea/vomit 08/17/23   Melene Plan, DO  senna-docusate (SENOKOT-S) 8.6-50 MG tablet Take 1 tablet by mouth at bedtime. 06/13/23   Zannie Cove, MD  sucroferric oxyhydroxide (VELPHORO) 500 MG chewable tablet Chew 1,000 mg by mouth See admin instructions. Take 1000 mg with each meal and 1000 mg with  snacks Patient not taking: Reported on 06/26/2023    [provider]  triamcinolone lotion (KENALOG) 0.1 % Apply 1 Application topically 2 (two) times daily.    [provider]      Allergies    Patient has no known allergies.    Review of Systems   Review of Systems  Physical Exam Updated Vital Signs BP 107/63   Pulse 75   Temp 98.3 F (36.8 C) (Oral)   Resp 16   Ht 1.626 m (5\' 4" )   Wt 76.7 kg   SpO2 100%   BMI 29.01 kg/m  Physical Exam Vitals and nursing note reviewed.  Constitutional:      General: She is not in acute distress.    Appearance: She is well-developed.  HENT:     Head: Normocephalic and atraumatic.     Right Ear: External ear normal.     Left Ear: External ear normal.  Eyes:     General: No visual field deficit or scleral icterus.       Right eye: No discharge.        Left eye: No discharge.     Conjunctiva/sclera: Conjunctivae normal.  Neck:     Trachea: No tracheal deviation.  Cardiovascular:     Rate and Rhythm: Normal rate and regular rhythm.  Pulmonary:     Effort: Pulmonary effort is normal. No respiratory distress.     Breath sounds: Normal breath sounds. No stridor. No wheezing or rales.  Abdominal:     General: Bowel sounds are normal. There is no distension.     Palpations: Abdomen is soft.     Tenderness: There is no abdominal tenderness. There is no guarding or rebound.  Musculoskeletal:        General: No tenderness.     Cervical back: Neck supple.  Skin:    General: Skin is warm and dry.     Findings: No rash.  Neurological:     Mental Status: She is alert and oriented to person, place, and time.     Cranial Nerves: No cranial nerve deficit, dysarthria or facial asymmetry.     Sensory: No sensory deficit.     Motor: No abnormal muscle tone, seizure  activity or pronator drift.     Coordination: Coordination normal.     Comments:  able to hold both legs off bed for 5 seconds, sensation intact in all extremities,   no left or right sided neglect, normal finger-nose exam bilaterally, no nystagmus noted   Psychiatric:        Mood and Affect: Mood normal.     ED Results / Procedures / Treatments   Labs (all labs ordered are listed, but only abnormal results are displayed) Labs Reviewed  CBC - Abnormal; Notable for the following components:      Result Value   WBC 11.6 (*)    nRBC 0.3 (*)    All other components within normal limits  BASIC METABOLIC PANEL - Abnormal; Notable for the following components:   Potassium 3.3 (*)    Chloride 94 (*)    Glucose, Bld 151 (*)    BUN 41 (*)    Creatinine, Ser 8.76 (*)    GFR, Estimated 4 (*)    All other components within normal limits    EKG None  Radiology CT Cervical Spine Wo Contrast  Result Date: 09/22/2023 CLINICAL DATA:  Neck trauma EXAM: CT CERVICAL SPINE WITHOUT CONTRAST TECHNIQUE: Multidetector CT imaging of the cervical spine was performed without intravenous contrast. Multiplanar CT image reconstructions were also generated. RADIATION DOSE REDUCTION: This exam was performed according to the departmental dose-optimization program which includes automated exposure control, adjustment of the mA and/or kV according to patient size and/or use of iterative reconstruction technique. COMPARISON:  CT cervical spine report 09/07/2016 FINDINGS: Alignment: no subluxation. Facet alignment within normal limits Skull base and vertebrae: No acute fracture. No primary bone lesion or focal pathologic process. Soft tissues and spinal canal: No prevertebral fluid or swelling. No visible canal hematoma. Disc levels:  Mild multilevel degenerative osteophytes. Upper chest: Negative. Other: None IMPRESSION: Mild degenerative change. No acute osseous abnormality. Electronically Signed   By: Jasmine Pang M.D.   On: 09/22/2023 20:58   CT Head Wo Contrast  Result Date: 09/22/2023 CLINICAL DATA:  Head trauma EXAM: CT HEAD WITHOUT CONTRAST TECHNIQUE: Contiguous axial  images were obtained from the base of the skull through the vertex without intravenous contrast. RADIATION DOSE REDUCTION: This exam was performed according to the departmental dose-optimization program which includes automated exposure control, adjustment of the mA and/or kV according to patient size and/or use of iterative reconstruction technique. COMPARISON:  Head CT 09/06/2016 FINDINGS: Brain: No evidence of acute infarction, hemorrhage, hydrocephalus, extra-axial collection or mass lesion/mass effect. There is mild patchy periventricular white matter hypodensity. Vascular: Atherosclerotic calcifications are present within the cavernous internal carotid arteries. Skull: Normal. Negative for fracture or focal lesion. Sinuses/Orbits: Paranasal sinuses are clear. There is new hyperdensity throughout the left globe. Right globe within normal limits. Other: None. IMPRESSION: 1. No acute intracranial process. 2. Mild chronic small vessel ischemic changes. 3. New hyperdensity throughout the left globe, possibly hemorrhage. Correlate with ophthalmologic exam. Electronically Signed   By: Darliss Cheney M.D.   On: 09/22/2023 20:54   DG Chest 2 View  Result Date: 09/22/2023 CLINICAL DATA:  Dizziness EXAM: CHEST - 2 VIEW COMPARISON:  Chest x-ray 06/09/2023 FINDINGS: There streaky perihilar opacities bilaterally. There is no focal lung consolidation, pleural effusion or pneumothorax. Cardiomediastinal silhouette is within normal limits. No acute fractures are seen. IMPRESSION: Streaky perihilar opacities bilaterally may represent bronchiolitis or reactive airways disease. Electronically Signed   By: Darliss Cheney M.D.   On: 09/22/2023 20:47  Procedures Procedures    Medications Ordered in ED Medications - No data to display  ED Course/ Medical Decision Making/ A&P Clinical Course as of 09/22/23 2240  Tue Sep 22, 2023  2118 CBC shows white blood cell count slightly elevated at 11 [JK]  2118 CT C-spine  without acute findings [JK]  2119 Head CT shows new hyperdensity throughout the left globe possibly hemorrhage [JK]  2119 Chest x-ray shows findings suggestive of possible bronchiolitis or reactive airway disease patient is not having any respiratory symptoms [JK]  2129 Reviewed head CT findings with patient.  Patient states she has had ocular surgery on her left eye since the last CT scan performed here.  Patient is not having any acute vision complaints.  She states she still has some vision in that eye.  Will have her follow-up with her eye doctor to be rechecked [JK]  2231 Basic metabolic panel(!) Metabolic panel consistent with her chronic renal failure [JK]    Clinical Course User Index [JK] Linwood Dibbles, MD                                 Medical Decision Making Problems Addressed: Chronic kidney disease, unspecified CKD stage: chronic illness or injury Dizziness: acute illness or injury that poses a threat to life or bodily functions  Amount and/or Complexity of Data Reviewed Labs: ordered. Decision-making details documented in ED Course. Radiology: ordered and independent interpretation performed.   Patient presented to the ED for evaluation of dizziness ongoing for over a month.  Patient reports having episodes of room spinning as well as having some ringing in her ears.  Symptoms suggestive of possible peripheral vertigo.  Patient does not have any focal neurologic deficits here in the ED.  Low suspicion for stroke.  MRI is not available at this facility but I do not think she requires transfer for emergent MRI she is not anemic.  She does have chronic renal failure but this is unchanged from baseline and there are no acute electrolyte abnormalities.  Head CT does not show any signs or injury.  Incidental ocular findings noted.  Patient states she did have surgery in her left eye.  Will have her follow-up with her eye doctor although do not think there is any signs of any acute ocular  injury. Will have patient follow-up with neurology for further outpatient evaluation.       Final Clinical Impression(s) / ED Diagnoses Final diagnoses:  Dizziness  Chronic kidney disease, unspecified CKD stage    Rx / DC Orders ED Discharge Orders          Ordered    Ambulatory referral to Neurology       Comments: An appointment is requested in approximately: 2 weeks   09/22/23 2237              Linwood Dibbles, MD 09/22/23 2240

## 2023-09-22 NOTE — Discharge Instructions (Signed)
Follow-up with your eye doctor to be rechecked due to the CT scan findings that we discussed.  Follow-up with the neurologist for further evaluation of the dizziness.

## 2023-09-22 NOTE — ED Notes (Signed)
2 attempts to draw blood, second RN contacted for ultrasound blood draw.

## 2023-10-14 ENCOUNTER — Ambulatory Visit
Admission: RE | Admit: 2023-10-14 | Discharge: 2023-10-14 | Disposition: A | Discharge status: Home / Self Care | Payer: Medicare HMO | Source: Ambulatory Visit | Attending: Family | Admitting: Family

## 2023-10-14 ENCOUNTER — Other Ambulatory Visit: Payer: Self-pay | Admitting: Family

## 2023-10-14 DIAGNOSIS — S61412A Laceration without foreign body of left hand, initial encounter: Secondary | ICD-10-CM

## 2023-11-04 ENCOUNTER — Encounter (HOSPITAL_COMMUNITY): Payer: Self-pay

## 2023-11-10 ENCOUNTER — Other Ambulatory Visit: Payer: Self-pay

## 2023-11-10 DIAGNOSIS — I96 Gangrene, not elsewhere classified: Secondary | ICD-10-CM

## 2023-11-11 ENCOUNTER — Encounter: Payer: Self-pay | Admitting: Vascular Surgery

## 2023-11-11 ENCOUNTER — Ambulatory Visit (HOSPITAL_COMMUNITY)
Admission: RE | Admit: 2023-11-11 | Discharge: 2023-11-11 | Disposition: A | Discharge status: Home / Self Care | Payer: 59 | Source: Ambulatory Visit | Attending: Vascular Surgery | Admitting: Vascular Surgery

## 2023-11-11 ENCOUNTER — Ambulatory Visit (INDEPENDENT_AMBULATORY_CARE_PROVIDER_SITE_OTHER)
Admission: RE | Admit: 2023-11-11 | Discharge: 2023-11-11 | Disposition: A | Discharge status: Home / Self Care | Payer: 59 | Source: Ambulatory Visit | Attending: Vascular Surgery | Admitting: Vascular Surgery

## 2023-11-11 ENCOUNTER — Other Ambulatory Visit: Payer: Self-pay | Admitting: *Deleted

## 2023-11-11 ENCOUNTER — Encounter (HOSPITAL_COMMUNITY): Payer: Self-pay

## 2023-11-11 ENCOUNTER — Ambulatory Visit (INDEPENDENT_AMBULATORY_CARE_PROVIDER_SITE_OTHER): Payer: 59 | Admitting: Vascular Surgery

## 2023-11-11 VITALS — BP 138/88 | HR 70 | Temp 97.9°F | Resp 20 | Ht 64.0 in | Wt 172.0 lb

## 2023-11-11 DIAGNOSIS — N186 End stage renal disease: Secondary | ICD-10-CM

## 2023-11-11 DIAGNOSIS — Z95828 Presence of other vascular implants and grafts: Secondary | ICD-10-CM

## 2023-11-11 DIAGNOSIS — Z9889 Other specified postprocedural states: Secondary | ICD-10-CM

## 2023-11-11 DIAGNOSIS — I96 Gangrene, not elsewhere classified: Secondary | ICD-10-CM | POA: Diagnosis present

## 2023-11-11 DIAGNOSIS — I739 Peripheral vascular disease, unspecified: Secondary | ICD-10-CM | POA: Diagnosis present

## 2023-11-11 DIAGNOSIS — I7025 Atherosclerosis of native arteries of other extremities with ulceration: Secondary | ICD-10-CM | POA: Diagnosis not present

## 2023-11-11 DIAGNOSIS — L98499 Non-pressure chronic ulcer of skin of other sites with unspecified severity: Secondary | ICD-10-CM | POA: Diagnosis not present

## 2023-11-11 LAB — VAS US ABI WITH/WO TBI

## 2023-11-11 NOTE — Progress Notes (Signed)
 Patient ID: Melissa Howe, female   DOB: 02/26/1951, 73 y.o.   MRN: [id number]  Reason for Consult: Follow-up   Referred by Raymon Mutton., FNP  Subjective:     HPI:  Melissa Howe is a 73 y.o. female history of end-stage renal disease currently dialyzing 3 times a day with peritoneal dialysis catheter that was placed last July.  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  She walks without limitation but does have some numbness of the feet bilaterally and has undergone angiography of the right lower extremity with no intervention in the past.  She has a fistula in the left upper extremity which she states is used for blood draws but has not been used for dialysis and she is transition to peritoneal dialysis.  She had a small injury to her left small finger which is failed to heal and has worsened and she now also has ulceration of the left middle finger as well.  She is on aspirin.  Past Medical History:  Diagnosis Date   Anemia    CHF (congestive heart failure) (HCC)    Chronic kidney disease    Constipation    Coronary artery disease    Diabetes mellitus    Type II   Dyspnea    with exertion   ESRD (end stage renal disease) (HCC)    History of blood transfusion    Hyperlipemia    Hypertension    Ischemic cardiomyopathy    Myocardial infarction (HCC)    Pneumonia 2019   Family History  Problem Relation Age of Onset   Hypertension Mother    Diabetes Mother    Hypertension Sister    Past Surgical History:  Procedure Laterality Date   ABDOMINAL AORTOGRAM W/LOWER EXTREMITY N/A 10/15/2022   Procedure: ABDOMINAL AORTOGRAM W/LOWER EXTREMITY;  Surgeon: Victorino Sparrow, MD;  Location: [facility] INVASIVE CV LAB;  Service: Cardiovascular;  Laterality: N/A;   APPENDECTOMY     AV FISTULA  PLACEMENT Left 10/05/2019   Procedure: INSERTION OF ARTERIOVENOUS (AV) GORE-TEX VASCULAR GRAFT LEFT ARM;  Surgeon: Larina Earthly, MD;  Location: [facility] OR;  Service: Vascular;  Laterality: Left;   AV FISTULA PLACEMENT Left 02/27/2021   Procedure: LEFT ARM FIRST STAGE BASILIC VEIN  ARTERIOVENOUS (AV) FISTULA CREATION;  Surgeon: Maeola Harman, MD;  Location: [facility] OR;  Service: Vascular;  Laterality: Left;   BASCILIC VEIN TRANSPOSITION Left 04/17/2021   Procedure: LEFT SECOND STAGE BASCILIC VEIN TRANSPOSITION;  Surgeon: Maeola Harman, MD;  Location: [facility] OR;  Service: Vascular;  Laterality: Left;   BIOPSY  07/24/2020   Procedure: BIOPSY;  Surgeon: Sherrilyn Rist, MD;  Location: [facility] ENDOSCOPY;  Service: Gastroenterology;;   BUBBLE STUDY  01/02/2020   Procedure: BUBBLE STUDY;  Surgeon: Parke Poisson, MD;  Location: [facility] ENDOSCOPY;  Service: Cardiology;;   CAPD INSERTION N/A 05/01/2023   Procedure: LAPAROSCOPIC INSERTION CONTINUOUS AMBULATORY PERITONEAL DIALYSIS  (CAPD) CATHETER;  Surgeon: Maeola Harman, MD;  Location: [facility] OR;  Service: Vascular;  Laterality: N/A;   CARDIAC CATHETERIZATION     COLONOSCOPY WITH PROPOFOL N/A 07/24/2020   Procedure: COLONOSCOPY WITH PROPOFOL;  Surgeon: Sherrilyn Rist, MD;  Location: [facility] ENDOSCOPY;  Service: Gastroenterology;  Laterality: N/A;   CORONARY STENT INTERVENTION N/A 07/12/2021   Procedure:  CORONARY STENT INTERVENTION;  Surgeon: Corky Crafts, MD;  Location: [facility] INVASIVE CV LAB;  Service: Cardiovascular;  Laterality: N/A;   CORONARY ULTRASOUND/IVUS N/A 07/12/2021   Procedure: Intravascular Ultrasound/IVUS;  Surgeon: Corky Crafts, MD;  Location: [facility] INVASIVE CV LAB;  Service: Cardiovascular;  Laterality: N/A;   ESOPHAGOGASTRODUODENOSCOPY (EGD) WITH PROPOFOL N/A 07/24/2020   Procedure: ESOPHAGOGASTRODUODENOSCOPY (EGD) WITH PROPOFOL;  Surgeon: Sherrilyn Rist, MD;  Location: [facility] ENDOSCOPY;  Service: Gastroenterology;   Laterality: N/A;   EYE SURGERY Bilateral    Cataract   FOREIGN BODY REMOVAL Left 05/13/2018   Procedure: FOREIGN BODY REMOVAL left foot;  Surgeon: Cammy Copa, MD;  Location: [facility] OR;  Service: Orthopedics;  Laterality: Left;   I & D EXTREMITY Left 05/21/2018   Procedure: LEFT FOOT DEBRIDEMENT AND WOUND CLOSURE;  Surgeon: Nadara Mustard, MD;  Location: [facility] OR;  Service: Orthopedics;  Laterality: Left;   LEFT HEART CATH AND CORONARY ANGIOGRAPHY N/A 07/12/2021   Procedure: LEFT HEART CATH AND CORONARY ANGIOGRAPHY;  Surgeon: Corky Crafts, MD;  Location: [facility] INVASIVE CV LAB;  Service: Cardiovascular;  Laterality: N/A;   POLYPECTOMY  07/24/2020   Procedure: POLYPECTOMY;  Surgeon: Sherrilyn Rist, MD;  Location: [facility] ENDOSCOPY;  Service: Gastroenterology;;   TEE WITHOUT CARDIOVERSION N/A 01/02/2020   Procedure: TRANSESOPHAGEAL ECHOCARDIOGRAM (TEE);  Surgeon: Parke Poisson, MD;  Location: [facility] ENDOSCOPY;  Service: Cardiology;  Laterality: N/A;   TONSILLECTOMY     TUBAL LIGATION      Short Social History:  Social History   Tobacco Use   Smoking status: Former    Current packs/day: 0.00    Types: Cigarettes    Start date: 01/19/1980    Quit date: 01/19/1984    Years since quitting: 39.8    Passive exposure: Never   Smokeless tobacco: Never  Substance Use Topics   Alcohol use: No    No Known Allergies  Current Outpatient Medications  Medication Sig Dispense Refill   amLODipine (NORVASC) 10 MG tablet Take 10 mg by mouth daily.     aspirin 81 MG chewable tablet Chew 1 tablet (81 mg total) by mouth daily. 30 tablet 1   atorvastatin (LIPITOR) 80 MG tablet Take 80 mg by mouth at bedtime.     benzonatate (TESSALON) 100 MG capsule Take 1 capsule (100 mg total) by mouth every 8 (eight) hours. 21 capsule 0   calcitRIOL (ROCALTROL) 0.5 MCG capsule Take 5 capsules (2.5 mcg total) by mouth Every Tuesday,Thursday,and Saturday with dialysis. 60 capsule 2   cinacalcet (SENSIPAR) 30 MG  tablet Take 30 mg by mouth daily. With snack     doxycycline (VIBRAMYCIN) 100 MG capsule Take 1 capsule (100 mg total) by mouth 2 (two) times daily. One po bid x 7 days 14 capsule 0   hydrALAZINE (APRESOLINE) 50 MG tablet Take 1 tablet (50 mg total) by mouth 2 (two) times daily. 60 tablet 0   Hyprom-Naphaz-Polysorb-Zn Sulf (CLEAR EYES COMPLETE OP) Place 1 drop into both eyes daily as needed (dry eyes).     Insulin Glargine Solostar (LANTUS) 100 UNIT/ML Solostar Pen Inject 20 Units as directed daily.     insulin lispro (HUMALOG KWIKPEN) 100 UNIT/ML KwikPen take humalog 4 units with meals three times a day plus sliding scale.  Mild Sliding Scale Blood Glucose        Insulin 60-150                     None 151-200  None 201-250                   2 units 251-300                   4 units 301-350                   6 units 351-400                   8 units      >400                        9 units and call provider 15 mL 11   Insulin Pen Needle 32G X 4 MM MISC Use 4x a day 300 each 3   isosorbide mononitrate (IMDUR) 30 MG 24 hr tablet Take 1 tablet (30 mg total) by mouth daily. 30 tablet 1   lactulose (CHRONULAC) 10 GM/15ML solution Take 60 mLs by mouth every other day.     linagliptin (TRADJENTA) 5 MG TABS tablet Take 1 tablet (5 mg total) by mouth daily. 90 tablet 3   Methoxy PEG-Epoetin Beta (MIRCERA IJ)      ondansetron (ZOFRAN-ODT) 4 MG disintegrating tablet 4mg  ODT q4 hours prn nausea/vomit 20 tablet 0   senna-docusate (SENOKOT-S) 8.6-50 MG tablet Take 1 tablet by mouth at bedtime. 90 tablet 0   sucroferric oxyhydroxide (VELPHORO) 500 MG chewable tablet Chew 1,000 mg by mouth See admin instructions. Take 1000 mg with each meal and 1000 mg with snacks     triamcinolone lotion (KENALOG) 0.1 % Apply 1 Application topically 2 (two) times daily.     No current facility-administered medications for this visit.    Review of Systems  Constitutional:  Constitutional  negative. HENT: HENT negative.  Eyes: Eyes negative.  Respiratory: Respiratory negative.  Cardiovascular: Cardiovascular negative.  GI: Gastrointestinal negative.  Musculoskeletal: Musculoskeletal negative.  Skin: Positive for wound.  Neurological: Positive for numbness.  Hematologic: Hematologic/lymphatic negative.  Psychiatric: Psychiatric negative.        Objective:  Objective   Vitals:   11/11/23 1214  BP: 138/88  Pulse: 70  Resp: 20  Temp: 97.9 F (36.6 C)  SpO2: 99%  Weight: 172 lb (78 kg)  Height: 5\' 4"  (1.626 m)   Body mass index is 29.52 kg/m.  Physical Exam HENT:     Head: Normocephalic.  Cardiovascular:     Pulses:          Radial pulses are 0 on the left side.       Femoral pulses are 2+ on the right side and 2+ on the left side.      Popliteal pulses are 0 on the right side and 0 on the left side.       Dorsalis pedis pulses are 0 on the right side and 0 on the left side.       Posterior tibial pulses are 0 on the right side and 0 on the left side.  Abdominal:     General: Abdomen is flat.     Palpations: Abdomen is soft.  Musculoskeletal:     Comments: There is a deformity of the left second toe with dark discoloration, there is ulceration of the left hip of the middle finger and left small finger distal tip  Skin:    Capillary Refill: Capillary refill takes 2 to 3 seconds.  Neurological:     General: No focal deficit present.  Mental Status: She is alert.  Psychiatric:        Mood and Affect: Mood normal.     Data: ABI Findings:  +---------+------------------+-----+----------+--------+  Right   Rt Pressure (mmHg)IndexWaveform  Comment   +---------+------------------+-----+----------+--------+  Brachial 96                                         +---------+------------------+-----+----------+--------+  PTA     116               1.14 monophasic          +---------+------------------+-----+----------+--------+  DP       >254              2.49 biphasic            +---------+------------------+-----+----------+--------+  Great Toe55                0.54 Abnormal            +---------+------------------+-----+----------+--------+   +---------+----------------+-----+------------------+----------------------  ----+  Left    Lt Pressure     IndexWaveform          Comment                               (mmHg)                                                              +---------+----------------+-----+------------------+----------------------  ----+  Brachial 102                                                                 +---------+----------------+-----+------------------+----------------------  ----+  ATA     >254            2.49 biphasic                                       +---------+----------------+-----+------------------+----------------------  ----+  PTA     0               0.00 dampened          Unable to obtain,                                          monophasic        inaudible                    +---------+----------------+-----+------------------+----------------------  ----+  PERO    0               0.00 dampened          Unable to obtain ,  monophasic        inaudible                    +---------+----------------+-----+------------------+----------------------  ----+  Great Toe17              0.17                                                +---------+----------------+-----+------------------+----------------------  ----+   +-------+-----------+-----------+------------+------------+  ABI/TBIToday's ABIToday's TBIPrevious ABIPrevious TBI  +-------+-----------+-----------+------------+------------+  Right [place]         0.54       [place]          0.70          +-------+-----------+-----------+------------+------------+  Left  [place]         0.17       [place]           0.53          +-------+-----------+-----------+------------+------------+         Bilateral TBIs appear decreased.    Summary:  Right: Resting right ankle-brachial index indicates noncompressible right  lower extremity arteries. The right toe-brachial index is abnormal.   Left: Resting left ankle-brachial index indicates noncompressible left  lower extremity arteries. The left toe-brachial index is abnormal.   Findings:  +-----------------+-------------+----------+---------+----------+----------  ----+  Upper Arm        Diameter (cm)Depth (cm)BranchingPSV (cm/s) Flow  Volume    Arteriovenous                                                  (ml/min)     Fistula                                                                     +-----------------+-------------+----------+---------+----------+----------  ----+  AVF anastomosis                                     228         335         +-----------------+-------------+----------+---------+----------+----------  ----+  outflow vein          0.5        0.3                487                     distal upper arm                                                            +-----------------+-------------+----------+---------+----------+----------  ----+  outflow vein mid      0.2        0.2  63                     upper arm                                                                   +-----------------+-------------+----------+---------+----------+----------  ----+      +---------------------------+-----+-------+--------+                            RightLeft   Comments  +---------------------------+-----+-------+--------+  Brachial                                        +---------------------------+-----+-------+--------+  Radial Ambient                  39 mmHg          +---------------------------+-----+-------+--------+   Radial AV Compression           40 mmHg          +---------------------------+-----+-------+--------+  Ulnar Ambient                                    +---------------------------+-----+-------+--------+  Ulnar AV Compression                             +---------------------------+-----+-------+--------+  2nd Digit Ambient                                +---------------------------+-----+-------+--------+  2nd Digit Ulnar Compression                      +---------------------------+-----+-------+--------+      Summary:  No significant increase in velocity with graft compression.       Assessment/Plan:    73 year old female with end-stage renal disease currently dialyzing via peritoneal dialysis catheter 3 times daily.  She has an existing basilic vein fistula on the left of which there is no steal today but she now has ulceration of 2 fingers on the left.  She does not have a palpable radial pulse even with compression of the fistula.  I have recommended either fistula ligation versus arch angiography with left upper extremity angiogram.  Given that patient would like to keep the fistula in the event she should lose her peroneal dialysis catheter we will begin with angiography to evaluate for possible intervention to improve blood flow for her hand and then she would need hand surgery evaluation as well.  Ultimately we may need to ligate the fistula we discussed that that will be performed on a separate day and she demonstrates good understanding.  From a lower extremity standpoint she does have severely depressed toe pressure on the left with toe discoloration of the second toe.  If she tolerates large aortogram with left upper extremity angiography we can also evaluate the left lower extremity with angiography and possible intervention.  Certainly if the left hand is the most pressing issue at this time.  She will continue aspirin and we will get her scheduled on  Monday in the near future.     Maeola Harman MD [facility]

## 2023-11-11 NOTE — H&P (View-Only) (Signed)
Patient ID: Melissa Howe, female   DOB: 02/26/1951, 73 y.o.   MRN: [id number]  Reason for Consult: Follow-up   Referred by Raymon Mutton., FNP  Subjective:     HPI:  Melissa Howe is a 73 y.o. female history of end-stage renal disease currently dialyzing 3 times a day with peritoneal dialysis catheter that was placed last July.  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  She walks without limitation but does have some numbness of the feet bilaterally and has undergone angiography of the right lower extremity with no intervention in the past.  She has a fistula in the left upper extremity which she states is used for blood draws but has not been used for dialysis and she is transition to peritoneal dialysis.  She had a small injury to her left small finger which is failed to heal and has worsened and she now also has ulceration of the left middle finger as well.  She is on aspirin.  Past Medical History:  Diagnosis Date   Anemia    CHF (congestive heart failure) (HCC)    Chronic kidney disease    Constipation    Coronary artery disease    Diabetes mellitus    Type II   Dyspnea    with exertion   ESRD (end stage renal disease) (HCC)    History of blood transfusion    Hyperlipemia    Hypertension    Ischemic cardiomyopathy    Myocardial infarction (HCC)    Pneumonia 2019   Family History  Problem Relation Age of Onset   Hypertension Mother    Diabetes Mother    Hypertension Sister    Past Surgical History:  Procedure Laterality Date   ABDOMINAL AORTOGRAM W/LOWER EXTREMITY N/A 10/15/2022   Procedure: ABDOMINAL AORTOGRAM W/LOWER EXTREMITY;  Surgeon: Victorino Sparrow, MD;  Location: [facility] INVASIVE CV LAB;  Service: Cardiovascular;  Laterality: N/A;   APPENDECTOMY     AV FISTULA  PLACEMENT Left 10/05/2019   Procedure: INSERTION OF ARTERIOVENOUS (AV) GORE-TEX VASCULAR GRAFT LEFT ARM;  Surgeon: Larina Earthly, MD;  Location: [facility] OR;  Service: Vascular;  Laterality: Left;   AV FISTULA PLACEMENT Left 02/27/2021   Procedure: LEFT ARM FIRST STAGE BASILIC VEIN  ARTERIOVENOUS (AV) FISTULA CREATION;  Surgeon: Maeola Harman, MD;  Location: [facility] OR;  Service: Vascular;  Laterality: Left;   BASCILIC VEIN TRANSPOSITION Left 04/17/2021   Procedure: LEFT SECOND STAGE BASCILIC VEIN TRANSPOSITION;  Surgeon: Maeola Harman, MD;  Location: [facility] OR;  Service: Vascular;  Laterality: Left;   BIOPSY  07/24/2020   Procedure: BIOPSY;  Surgeon: Sherrilyn Rist, MD;  Location: [facility] ENDOSCOPY;  Service: Gastroenterology;;   BUBBLE STUDY  01/02/2020   Procedure: BUBBLE STUDY;  Surgeon: Parke Poisson, MD;  Location: [facility] ENDOSCOPY;  Service: Cardiology;;   CAPD INSERTION N/A 05/01/2023   Procedure: LAPAROSCOPIC INSERTION CONTINUOUS AMBULATORY PERITONEAL DIALYSIS  (CAPD) CATHETER;  Surgeon: Maeola Harman, MD;  Location: [facility] OR;  Service: Vascular;  Laterality: N/A;   CARDIAC CATHETERIZATION     COLONOSCOPY WITH PROPOFOL N/A 07/24/2020   Procedure: COLONOSCOPY WITH PROPOFOL;  Surgeon: Sherrilyn Rist, MD;  Location: [facility] ENDOSCOPY;  Service: Gastroenterology;  Laterality: N/A;   CORONARY STENT INTERVENTION N/A 07/12/2021   Procedure:  CORONARY STENT INTERVENTION;  Surgeon: Corky Crafts, MD;  Location: [facility] INVASIVE CV LAB;  Service: Cardiovascular;  Laterality: N/A;   CORONARY ULTRASOUND/IVUS N/A 07/12/2021   Procedure: Intravascular Ultrasound/IVUS;  Surgeon: Corky Crafts, MD;  Location: [facility] INVASIVE CV LAB;  Service: Cardiovascular;  Laterality: N/A;   ESOPHAGOGASTRODUODENOSCOPY (EGD) WITH PROPOFOL N/A 07/24/2020   Procedure: ESOPHAGOGASTRODUODENOSCOPY (EGD) WITH PROPOFOL;  Surgeon: Sherrilyn Rist, MD;  Location: [facility] ENDOSCOPY;  Service: Gastroenterology;   Laterality: N/A;   EYE SURGERY Bilateral    Cataract   FOREIGN BODY REMOVAL Left 05/13/2018   Procedure: FOREIGN BODY REMOVAL left foot;  Surgeon: Cammy Copa, MD;  Location: [facility] OR;  Service: Orthopedics;  Laterality: Left;   I & D EXTREMITY Left 05/21/2018   Procedure: LEFT FOOT DEBRIDEMENT AND WOUND CLOSURE;  Surgeon: Nadara Mustard, MD;  Location: [facility] OR;  Service: Orthopedics;  Laterality: Left;   LEFT HEART CATH AND CORONARY ANGIOGRAPHY N/A 07/12/2021   Procedure: LEFT HEART CATH AND CORONARY ANGIOGRAPHY;  Surgeon: Corky Crafts, MD;  Location: [facility] INVASIVE CV LAB;  Service: Cardiovascular;  Laterality: N/A;   POLYPECTOMY  07/24/2020   Procedure: POLYPECTOMY;  Surgeon: Sherrilyn Rist, MD;  Location: [facility] ENDOSCOPY;  Service: Gastroenterology;;   TEE WITHOUT CARDIOVERSION N/A 01/02/2020   Procedure: TRANSESOPHAGEAL ECHOCARDIOGRAM (TEE);  Surgeon: Parke Poisson, MD;  Location: [facility] ENDOSCOPY;  Service: Cardiology;  Laterality: N/A;   TONSILLECTOMY     TUBAL LIGATION      Short Social History:  Social History   Tobacco Use   Smoking status: Former    Current packs/day: 0.00    Types: Cigarettes    Start date: 01/19/1980    Quit date: 01/19/1984    Years since quitting: 39.8    Passive exposure: Never   Smokeless tobacco: Never  Substance Use Topics   Alcohol use: No    No Known Allergies  Current Outpatient Medications  Medication Sig Dispense Refill   amLODipine (NORVASC) 10 MG tablet Take 10 mg by mouth daily.     aspirin 81 MG chewable tablet Chew 1 tablet (81 mg total) by mouth daily. 30 tablet 1   atorvastatin (LIPITOR) 80 MG tablet Take 80 mg by mouth at bedtime.     benzonatate (TESSALON) 100 MG capsule Take 1 capsule (100 mg total) by mouth every 8 (eight) hours. 21 capsule 0   calcitRIOL (ROCALTROL) 0.5 MCG capsule Take 5 capsules (2.5 mcg total) by mouth Every Tuesday,Thursday,and Saturday with dialysis. 60 capsule 2   cinacalcet (SENSIPAR) 30 MG  tablet Take 30 mg by mouth daily. With snack     doxycycline (VIBRAMYCIN) 100 MG capsule Take 1 capsule (100 mg total) by mouth 2 (two) times daily. One po bid x 7 days 14 capsule 0   hydrALAZINE (APRESOLINE) 50 MG tablet Take 1 tablet (50 mg total) by mouth 2 (two) times daily. 60 tablet 0   Hyprom-Naphaz-Polysorb-Zn Sulf (CLEAR EYES COMPLETE OP) Place 1 drop into both eyes daily as needed (dry eyes).     Insulin Glargine Solostar (LANTUS) 100 UNIT/ML Solostar Pen Inject 20 Units as directed daily.     insulin lispro (HUMALOG KWIKPEN) 100 UNIT/ML KwikPen take humalog 4 units with meals three times a day plus sliding scale.  Mild Sliding Scale Blood Glucose        Insulin 60-150                     None 151-200  None 201-250                   2 units 251-300                   4 units 301-350                   6 units 351-400                   8 units      >400                        9 units and call provider 15 mL 11   Insulin Pen Needle 32G X 4 MM MISC Use 4x a day 300 each 3   isosorbide mononitrate (IMDUR) 30 MG 24 hr tablet Take 1 tablet (30 mg total) by mouth daily. 30 tablet 1   lactulose (CHRONULAC) 10 GM/15ML solution Take 60 mLs by mouth every other day.     linagliptin (TRADJENTA) 5 MG TABS tablet Take 1 tablet (5 mg total) by mouth daily. 90 tablet 3   Methoxy PEG-Epoetin Beta (MIRCERA IJ)      ondansetron (ZOFRAN-ODT) 4 MG disintegrating tablet 4mg  ODT q4 hours prn nausea/vomit 20 tablet 0   senna-docusate (SENOKOT-S) 8.6-50 MG tablet Take 1 tablet by mouth at bedtime. 90 tablet 0   sucroferric oxyhydroxide (VELPHORO) 500 MG chewable tablet Chew 1,000 mg by mouth See admin instructions. Take 1000 mg with each meal and 1000 mg with snacks     triamcinolone lotion (KENALOG) 0.1 % Apply 1 Application topically 2 (two) times daily.     No current facility-administered medications for this visit.    Review of Systems  Constitutional:  Constitutional  negative. HENT: HENT negative.  Eyes: Eyes negative.  Respiratory: Respiratory negative.  Cardiovascular: Cardiovascular negative.  GI: Gastrointestinal negative.  Musculoskeletal: Musculoskeletal negative.  Skin: Positive for wound.  Neurological: Positive for numbness.  Hematologic: Hematologic/lymphatic negative.  Psychiatric: Psychiatric negative.        Objective:  Objective   Vitals:   11/11/23 1214  BP: 138/88  Pulse: 70  Resp: 20  Temp: 97.9 F (36.6 C)  SpO2: 99%  Weight: 172 lb (78 kg)  Height: 5\' 4"  (1.626 m)   Body mass index is 29.52 kg/m.  Physical Exam HENT:     Head: Normocephalic.  Cardiovascular:     Pulses:          Radial pulses are 0 on the left side.       Femoral pulses are 2+ on the right side and 2+ on the left side.      Popliteal pulses are 0 on the right side and 0 on the left side.       Dorsalis pedis pulses are 0 on the right side and 0 on the left side.       Posterior tibial pulses are 0 on the right side and 0 on the left side.  Abdominal:     General: Abdomen is flat.     Palpations: Abdomen is soft.  Musculoskeletal:     Comments: There is a deformity of the left second toe with dark discoloration, there is ulceration of the left hip of the middle finger and left small finger distal tip  Skin:    Capillary Refill: Capillary refill takes 2 to 3 seconds.  Neurological:     General: No focal deficit present.  Mental Status: She is alert.  Psychiatric:        Mood and Affect: Mood normal.     Data: ABI Findings:  +---------+------------------+-----+----------+--------+  Right   Rt Pressure (mmHg)IndexWaveform  Comment   +---------+------------------+-----+----------+--------+  Brachial 96                                         +---------+------------------+-----+----------+--------+  PTA     116               1.14 monophasic          +---------+------------------+-----+----------+--------+  DP       >254              2.49 biphasic            +---------+------------------+-----+----------+--------+  Great Toe55                0.54 Abnormal            +---------+------------------+-----+----------+--------+   +---------+----------------+-----+------------------+----------------------  ----+  Left    Lt Pressure     IndexWaveform          Comment                               (mmHg)                                                              +---------+----------------+-----+------------------+----------------------  ----+  Brachial 102                                                                 +---------+----------------+-----+------------------+----------------------  ----+  ATA     >254            2.49 biphasic                                       +---------+----------------+-----+------------------+----------------------  ----+  PTA     0               0.00 dampened          Unable to obtain,                                          monophasic        inaudible                    +---------+----------------+-----+------------------+----------------------  ----+  PERO    0               0.00 dampened          Unable to obtain ,  monophasic        inaudible                    +---------+----------------+-----+------------------+----------------------  ----+  Great Toe17              0.17                                                +---------+----------------+-----+------------------+----------------------  ----+   +-------+-----------+-----------+------------+------------+  ABI/TBIToday's ABIToday's TBIPrevious ABIPrevious TBI  +-------+-----------+-----------+------------+------------+  Right [place]         0.54       [place]          0.70          +-------+-----------+-----------+------------+------------+  Left  [place]         0.17       [place]           0.53          +-------+-----------+-----------+------------+------------+         Bilateral TBIs appear decreased.    Summary:  Right: Resting right ankle-brachial index indicates noncompressible right  lower extremity arteries. The right toe-brachial index is abnormal.   Left: Resting left ankle-brachial index indicates noncompressible left  lower extremity arteries. The left toe-brachial index is abnormal.   Findings:  +-----------------+-------------+----------+---------+----------+----------  ----+  Upper Arm        Diameter (cm)Depth (cm)BranchingPSV (cm/s) Flow  Volume    Arteriovenous                                                  (ml/min)     Fistula                                                                     +-----------------+-------------+----------+---------+----------+----------  ----+  AVF anastomosis                                     228         335         +-----------------+-------------+----------+---------+----------+----------  ----+  outflow vein          0.5        0.3                487                     distal upper arm                                                            +-----------------+-------------+----------+---------+----------+----------  ----+  outflow vein mid      0.2        0.2  63                     upper arm                                                                   +-----------------+-------------+----------+---------+----------+----------  ----+      +---------------------------+-----+-------+--------+                            RightLeft   Comments  +---------------------------+-----+-------+--------+  Brachial                                        +---------------------------+-----+-------+--------+  Radial Ambient                  39 mmHg          +---------------------------+-----+-------+--------+   Radial AV Compression           40 mmHg          +---------------------------+-----+-------+--------+  Ulnar Ambient                                    +---------------------------+-----+-------+--------+  Ulnar AV Compression                             +---------------------------+-----+-------+--------+  2nd Digit Ambient                                +---------------------------+-----+-------+--------+  2nd Digit Ulnar Compression                      +---------------------------+-----+-------+--------+      Summary:  No significant increase in velocity with graft compression.       Assessment/Plan:    73 year old female with end-stage renal disease currently dialyzing via peritoneal dialysis catheter 3 times daily.  She has an existing basilic vein fistula on the left of which there is no steal today but she now has ulceration of 2 fingers on the left.  She does not have a palpable radial pulse even with compression of the fistula.  I have recommended either fistula ligation versus arch angiography with left upper extremity angiogram.  Given that patient would like to keep the fistula in the event she should lose her peroneal dialysis catheter we will begin with angiography to evaluate for possible intervention to improve blood flow for her hand and then she would need hand surgery evaluation as well.  Ultimately we may need to ligate the fistula we discussed that that will be performed on a separate day and she demonstrates good understanding.  From a lower extremity standpoint she does have severely depressed toe pressure on the left with toe discoloration of the second toe.  If she tolerates large aortogram with left upper extremity angiography we can also evaluate the left lower extremity with angiography and possible intervention.  Certainly if the left hand is the most pressing issue at this time.  She will continue aspirin and we will get her scheduled on  Monday in the near future.     Maeola Harman MD [facility]

## 2023-11-13 ENCOUNTER — Other Ambulatory Visit: Payer: Self-pay | Admitting: *Deleted

## 2023-11-13 DIAGNOSIS — N186 End stage renal disease: Secondary | ICD-10-CM

## 2023-11-17 ENCOUNTER — Ambulatory Visit (HOSPITAL_COMMUNITY): Payer: 59

## 2023-11-17 ENCOUNTER — Ambulatory Visit: Payer: Medicare HMO

## 2023-11-26 ENCOUNTER — Emergency Department (HOSPITAL_BASED_OUTPATIENT_CLINIC_OR_DEPARTMENT_OTHER)
Admission: EM | Admit: 2023-11-26 | Discharge: 2023-11-26 | Disposition: A | Discharge status: Home / Self Care | Payer: 59 | Attending: Emergency Medicine | Admitting: Emergency Medicine

## 2023-11-26 ENCOUNTER — Other Ambulatory Visit: Payer: Self-pay

## 2023-11-26 ENCOUNTER — Emergency Department (HOSPITAL_BASED_OUTPATIENT_CLINIC_OR_DEPARTMENT_OTHER): Payer: 59

## 2023-11-26 ENCOUNTER — Emergency Department (HOSPITAL_BASED_OUTPATIENT_CLINIC_OR_DEPARTMENT_OTHER): Payer: 59 | Admitting: Radiology

## 2023-11-26 DIAGNOSIS — Z992 Dependence on renal dialysis: Secondary | ICD-10-CM | POA: Diagnosis not present

## 2023-11-26 DIAGNOSIS — E785 Hyperlipidemia, unspecified: Secondary | ICD-10-CM | POA: Insufficient documentation

## 2023-11-26 DIAGNOSIS — I6782 Cerebral ischemia: Secondary | ICD-10-CM | POA: Diagnosis not present

## 2023-11-26 DIAGNOSIS — I120 Hypertensive chronic kidney disease with stage 5 chronic kidney disease or end stage renal disease: Secondary | ICD-10-CM | POA: Insufficient documentation

## 2023-11-26 DIAGNOSIS — I2510 Atherosclerotic heart disease of native coronary artery without angina pectoris: Secondary | ICD-10-CM | POA: Diagnosis not present

## 2023-11-26 DIAGNOSIS — R519 Headache, unspecified: Secondary | ICD-10-CM | POA: Insufficient documentation

## 2023-11-26 DIAGNOSIS — E876 Hypokalemia: Secondary | ICD-10-CM | POA: Insufficient documentation

## 2023-11-26 DIAGNOSIS — E860 Dehydration: Secondary | ICD-10-CM | POA: Diagnosis not present

## 2023-11-26 DIAGNOSIS — N186 End stage renal disease: Secondary | ICD-10-CM | POA: Diagnosis not present

## 2023-11-26 DIAGNOSIS — R079 Chest pain, unspecified: Secondary | ICD-10-CM | POA: Diagnosis present

## 2023-11-26 DIAGNOSIS — I255 Ischemic cardiomyopathy: Secondary | ICD-10-CM | POA: Insufficient documentation

## 2023-11-26 DIAGNOSIS — R531 Weakness: Secondary | ICD-10-CM | POA: Insufficient documentation

## 2023-11-26 DIAGNOSIS — Z20822 Contact with and (suspected) exposure to covid-19: Secondary | ICD-10-CM | POA: Insufficient documentation

## 2023-11-26 DIAGNOSIS — I959 Hypotension, unspecified: Secondary | ICD-10-CM | POA: Diagnosis not present

## 2023-11-26 DIAGNOSIS — I252 Old myocardial infarction: Secondary | ICD-10-CM | POA: Insufficient documentation

## 2023-11-26 LAB — CBC
HCT: 48.4 % — ABNORMAL HIGH (ref 36.0–46.0)
Hemoglobin: 15.4 g/dL — ABNORMAL HIGH (ref 12.0–15.0)
MCH: 29.8 pg (ref 26.0–34.0)
MCHC: 31.8 g/dL (ref 30.0–36.0)
MCV: 93.8 fL (ref 80.0–100.0)
Platelets: 282 10*3/uL (ref 150–400)
RBC: 5.16 MIL/uL — ABNORMAL HIGH (ref 3.87–5.11)
RDW: 14.8 % (ref 11.5–15.5)
WBC: 12.8 10*3/uL — ABNORMAL HIGH (ref 4.0–10.5)
nRBC: 0.2 % (ref 0.0–0.2)

## 2023-11-26 LAB — BASIC METABOLIC PANEL
Anion gap: 15 (ref 5–15)
BUN: 27 mg/dL — ABNORMAL HIGH (ref 8–23)
CO2: 28 mmol/L (ref 22–32)
Calcium: 10.3 mg/dL (ref 8.9–10.3)
Chloride: 93 mmol/L — ABNORMAL LOW (ref 98–111)
Creatinine, Ser: 9.79 mg/dL — ABNORMAL HIGH (ref 0.44–1.00)
GFR, Estimated: 4 mL/min — ABNORMAL LOW (ref >60)
Glucose, Bld: 245 mg/dL — ABNORMAL HIGH (ref 70–99)
Potassium: 2.6 mmol/L — CL (ref 3.5–5.1)
Sodium: 136 mmol/L (ref 135–145)

## 2023-11-26 LAB — RESP PANEL BY RT-PCR (RSV, FLU A&B, COVID)  RVPGX2
Influenza A by PCR: NEGATIVE
Influenza B by PCR: NEGATIVE
Resp Syncytial Virus by PCR: NEGATIVE
SARS Coronavirus 2 by RT PCR: NEGATIVE

## 2023-11-26 LAB — CBG MONITORING, ED: Glucose-Capillary: 217 mg/dL — ABNORMAL HIGH (ref 70–99)

## 2023-11-26 MED ORDER — MIDODRINE HCL 5 MG PO TABS
10.0000 mg | ORAL_TABLET | Freq: Three times a day (TID) | ORAL | Status: DC
  Administered 2023-11-26: 10 mg via ORAL
  Filled 2023-11-26: qty 2

## 2023-11-26 MED ORDER — SODIUM CHLORIDE 0.9 % IV BOLUS
500.0000 mL | Freq: Once | INTRAVENOUS | Status: AC
  Administered 2023-11-26: 500 mL via INTRAVENOUS

## 2023-11-26 MED ORDER — POTASSIUM CHLORIDE ER 10 MEQ PO TBCR: 20.0000 meq | EXTENDED_RELEASE_TABLET | Freq: Every day | ORAL | 0 refills | Status: DC

## 2023-11-26 MED ORDER — MIDODRINE HCL 10 MG PO TABS: 10.0000 mg | ORAL_TABLET | Freq: Three times a day (TID) | ORAL | 0 refills | Status: AC

## 2023-11-26 NOTE — ED Triage Notes (Signed)
Pt reports weakness and headache, along with decreased appetite for past "few weeks."   Pt also reports low BP at home "77/54."  Pt does at home dialysis, last done today at 8am.

## 2023-11-26 NOTE — ED Notes (Signed)
Pt began taking midodrine 2 days ago for low BP with no improvement.

## 2023-11-26 NOTE — ED Provider Notes (Signed)
[place] EMERGENCY DEPARTMENT AT [facility] Provider Note   CSN: [id number] Arrival date & time: 11/26/23  1203     History  Chief Complaint  Patient presents with   Weakness    Melissa Howe is a 73 y.o. female with a history of hypertension, hyperlipidemia, coronary disease, NSTEMI, ischemic cardiomyopathy, end-stage renal disease on peritoneal dialysis for the past 5 months, presenting to the ED with concern for generalized weakness, dizziness, low blood pressure.  Patient reports that she has had weakness and dizziness and lightheadedness for approximately 3 to 4 months to starting peritoneal dialysis.  She has noted that her blood pressure at home is dropped very low, is often low as 70s over 50s, and that her nephrologist started her on midodrine 5 mg 3 times daily which she has been taking faithfully (but did not take today).  She has noted specifically when she straightens up quickly after bending over, or stands up after sitting down, she will become very lightheaded, also with vertigo.  She says at times over the past month she has had posterior headaches, although not constantly.  She says her balance is felt off.  She is also been eating a lot less, has poor appetite, not drinking much.  She is here with her daughter at the bedside.  They report as a result of her dizziness she has fallen a few times.  HPI     Home Medications Prior to Admission medications   Medication Sig Start Date End Date Taking? Authorizing Provider  calcitRIOL (ROCALTROL) 0.5 MCG capsule Take by mouth. 09/16/23  Yes [provider]  gabapentin (NEURONTIN) 100 MG capsule Take 100 mg by mouth 2 (two) times daily. 11/02/23  Yes [provider]  midodrine (PROAMATINE) 10 MG tablet Take 1 tablet (10 mg total) by mouth 3 (three) times daily for 30 doses. 11/26/23 12/06/23 Yes Renatta Shrieves, Kermit Balo, MD  potassium chloride (KLOR-CON) 10 MEQ tablet Take 2 tablets (20 mEq total) by mouth  daily for 7 days. 11/26/23 12/03/23 Yes Terald Sleeper, MD  aspirin 81 MG chewable tablet Chew 1 tablet (81 mg total) by mouth daily. 07/13/21   Ghimire, Werner Lean, MD  atorvastatin (LIPITOR) 80 MG tablet Take 80 mg by mouth at bedtime.    [provider]  b complex-vitamin c-folic acid (NEPHRO-VITE) 0.8 MG TABS tablet Take 1 tablet by mouth daily.    [provider]  cinacalcet (SENSIPAR) 30 MG tablet Take 30 mg by mouth daily. With snack    [provider]  Hyprom-Naphaz-Polysorb-Zn Sulf (CLEAR EYES COMPLETE OP) Place 1 drop into both eyes daily as needed (dry eyes).    [provider]  Insulin Glargine Solostar (LANTUS) 100 UNIT/ML Solostar Pen Inject 20 Units into the skin daily. 11/11/22   [provider]  Insulin Pen Needle 32G X 4 MM MISC Use 4x a day 06/28/23   Thapa, [place], MD  isosorbide mononitrate (IMDUR) 30 MG 24 hr tablet Take 1 tablet (30 mg total) by mouth daily. 07/13/21   Ghimire, Werner Lean, MD  linagliptin (TRADJENTA) 5 MG TABS tablet Take 1 tablet (5 mg total) by mouth daily. 06/26/23   Thapa, [place], MD  Methoxy PEG-Epoetin Beta (MIRCERA IJ)  12/11/22 12/10/23  [provider]  sevelamer carbonate (RENVELA) 800 MG tablet Take 1,600-3,200 mg by mouth See admin instructions. 3200 mg with meals and 1600 mg with snacks    [provider]  triamcinolone lotion (KENALOG) 0.1 % Apply 1  Application topically 2 (two) times daily as needed (irritation).    [provider]      Allergies    Patient has no known allergies.    Review of Systems   Review of Systems  Physical Exam Updated Vital Signs BP 119/67 (BP Location: Right Leg)   Pulse 70   Temp 98.9 F (37.2 C)   Resp (!) 22   SpO2 98%  Physical Exam Constitutional:      General: She is not in acute distress. HENT:     Head: Normocephalic and atraumatic.     Comments: Tachy mucous membranes Eyes:     Conjunctiva/sclera: Conjunctivae normal.     Pupils:  Pupils are equal, round, and reactive to light.  Cardiovascular:     Rate and Rhythm: Normal rate and regular rhythm.  Pulmonary:     Effort: Pulmonary effort is normal. No respiratory distress.  Abdominal:     General: There is no distension.     Tenderness: There is no abdominal tenderness.  Skin:    General: Skin is warm and dry.  Neurological:     General: No focal deficit present.     Mental Status: She is alert. Mental status is at baseline.  Psychiatric:        Mood and Affect: Mood normal.        Behavior: Behavior normal.     ED Results / Procedures / Treatments   Labs (all labs ordered are listed, but only abnormal results are displayed) Labs Reviewed  BASIC METABOLIC PANEL - Abnormal; Notable for the following components:      Result Value   Potassium 2.6 (*)    Chloride 93 (*)    Glucose, Bld 245 (*)    BUN 27 (*)    Creatinine, Ser 9.79 (*)    GFR, Estimated 4 (*)    All other components within normal limits  CBC - Abnormal; Notable for the following components:   WBC 12.8 (*)    RBC 5.16 (*)    Hemoglobin 15.4 (*)    HCT 48.4 (*)    All other components within normal limits  CBG MONITORING, ED - Abnormal; Notable for the following components:   Glucose-Capillary 217 (*)    All other components within normal limits  RESP PANEL BY RT-PCR (RSV, FLU A&B, COVID)  [id number]  CBG MONITORING, ED    EKG EKG Interpretation Date/Time:  Thursday November 26 2023 12:28:31 EST Ventricular Rate:  77 PR Interval:  151 QRS Duration:  122 QT Interval:  387 QTC Calculation: 438 R Axis:   41  Text Interpretation: Sinus rhythm Nonspecific intraventricular conduction delay Consider anterior infarct Nonspecific T abnormalities, lateral leads Confirmed by Alvester Chou [phone]) on 11/26/2023 12:53:18 PM  Radiology DG Chest 2 View Result Date: 11/26/2023 CLINICAL DATA:  pneumonia evaluation. EXAM: CHEST - 2 VIEW COMPARISON:  09/22/2023. FINDINGS: Bilateral lung fields are  clear. Bilateral costophrenic angles are clear. Normal cardio-mediastinal silhouette. No acute osseous abnormalities. The soft tissues are within normal limits. IMPRESSION: No active cardiopulmonary disease. Electronically Signed   By: Jules Schick M.D.   On: 11/26/2023 14:17   CT Head Wo Contrast Result Date: 11/26/2023 CLINICAL DATA:  Headache. EXAM: CT HEAD WITHOUT CONTRAST TECHNIQUE: Contiguous axial images were obtained from the base of the skull through the vertex without intravenous contrast. RADIATION DOSE REDUCTION: This exam was performed according to the departmental dose-optimization program which includes automated exposure control, adjustment of the mA and/or kV according  to patient size and/or use of iterative reconstruction technique. COMPARISON:  Head CT dated 09/22/2023. FINDINGS: Brain: Mild age-related atrophy and chronic microvascular ischemic changes. There is no acute intracranial hemorrhage. No mass effect or midline shift. No extra-axial fluid collection. Vascular: No hyperdense vessel or unexpected calcification. Skull: Normal. Negative for fracture or focal lesion. Sinuses/Orbits: No acute finding. Chronic changes of the left globe. Other: None IMPRESSION: 1. No acute intracranial pathology. 2. Mild age-related atrophy and chronic microvascular ischemic changes. Electronically Signed   By: Elgie Collard M.D.   On: 11/26/2023 14:15    Procedures Procedures    Medications Ordered in ED Medications  sodium chloride 0.9 % bolus 500 mL (500 mLs Intravenous New Bag/Given 11/26/23 1440)    ED Course/ Medical Decision Making/ A&P Clinical Course as of 11/27/23 0608  Thu Nov 26, 2023  1253 BP 113 systolic at this time. [MT]  1410 Mistaken admission order -asked secretary to cancel  [MT]    Clinical Course User Index [MT] Ashanna Heinsohn, Kermit Balo, MD                                 Medical Decision Making Amount and/or Complexity of Data Reviewed Labs: ordered. Radiology:  ordered.  Risk Prescription drug management.   This patient presents to the ED with concern for dizziness, lightheadedness, low blood pressure. This involves an extensive number of treatment options, and is a complaint that carries with it a high risk of complications and morbidity.  The differential diagnosis includes dehydration versus anemia versus peritoneal dialysis fluid shifts versus other  Her headache may be multifactorial.  This may be related again to fluid shifts versus hypotension.  It is not a persistent headache to suggest subdural bleed or meningitis.  She has no meningismus, fever or photophobia or nuchal rigidity.  There is no indication for lumbar puncture.  But I think CT imaging would be reasonable as this is an atypical headache pattern for her, with increasing frequency.  Co-morbidities that complicate the patient evaluation: History of dialysis at high risk of electrolyte derangement, fluid imbalance  Additional history obtained from patient's daughter at the bedside  External records from outside source obtained and reviewed including hospitalization discharge summary from August  I ordered and personally interpreted labs.  The pertinent results include:  K 2.6, Glucose 245, Cr 9.79, WBC 12.8, Hgb 15.4, covid flu negative.  Pt is anuric.  I ordered imaging studies including CT head, x-ray of the chest I independently visualized and interpreted imaging which showed no emergent findings I agree with the radiologist interpretation  The patient was maintained on a cardiac monitor.  I personally viewed and interpreted the cardiac monitored which showed an underlying rhythm of: Sinus rhythm  Per my interpretation the patient's ECG shows sinus rhythm no acute ischemic findings  I ordered medication including IV fluid bolus for hydration  I have reviewed the patients home medicines and have made adjustments as needed  Test Considered: doubt acute pe, sepsis  After the  interventions noted above, I reevaluated the patient and found that they have: improved - patient okay ambulating and with orthostatic vs   Overall, I suspect her transient lightheadedness is related to fluctuations in her blood pressure, likely driven by dehydration and her PD.  The patient admits to not drinking much water or eating much food because it doesn't taste right.  This may be a lingering post-viral syndrome affecting  her taste.  I explained that her labs do indicate some level of dehydration, particularly how hemoconcentrated they appear.  She is not really fluid restricted on her home PD, and she feels she can make more of an effort to drink. But we also talked about her low K, which is likely dietary.  I will prescribe her a short course of K, but this needs to be cautiously corrected given her renal disease, as she is liable to become hyperkalemic with overzealous correction. More importantly, she can adjust her diet and resources were provided with K containing foods.  She and her daughter understand she needs to have her K rechecked within a week by their PCP.  Otherwise she is stable for discharge.   We will increase her midodrine dose to 10 mg TID and have her keep a daily BP diary.  I discussed with her daughter how to lay her flat and raise her legs if her BP drops, and to give additional midodrine.  IF there is AMS or confusion, call 911 and return promptly to the ED.  She verbalized understanding  Dispostion:  After consideration of the diagnostic results and the patients response to treatment, I feel that the patent would benefit from close outpatient follow up.         Final Clinical Impression(s) / ED Diagnoses Final diagnoses:  Hypotension, unspecified hypotension type  Hypokalemia    Rx / DC Orders ED Discharge Orders          Ordered    potassium chloride (KLOR-CON) 10 MEQ tablet  Daily        11/26/23 1538    midodrine (PROAMATINE) 10 MG tablet  3 times  daily        11/26/23 1538              Terald Sleeper, MD 11/27/23 [phone]

## 2023-11-26 NOTE — Discharge Instructions (Addendum)
Please increase your manage dose to 10 mg 3 times a day to help with your blood pressure.  Make an effort to eat more potassium in your diet and have your doctor's office recheck your potassium level in 1 week.  I will also put in potassium pills for a few days.  Please be drinking more water at home to stay hydrated.

## 2023-11-28 ENCOUNTER — Encounter (HOSPITAL_COMMUNITY): Payer: Self-pay

## 2023-11-30 ENCOUNTER — Ambulatory Visit (HOSPITAL_COMMUNITY)
Admission: RE | Admit: 2023-11-30 | Discharge: 2023-11-30 | Disposition: A | Discharge status: Home / Self Care | Payer: 59 | Attending: Vascular Surgery | Admitting: Vascular Surgery

## 2023-11-30 ENCOUNTER — Other Ambulatory Visit: Payer: Self-pay

## 2023-11-30 ENCOUNTER — Encounter (HOSPITAL_COMMUNITY)
Admission: RE | Disposition: A | Discharge status: Home / Self Care | Payer: Self-pay | Source: Home / Self Care | Attending: Vascular Surgery

## 2023-11-30 DIAGNOSIS — Z992 Dependence on renal dialysis: Secondary | ICD-10-CM | POA: Insufficient documentation

## 2023-11-30 DIAGNOSIS — Z87891 Personal history of nicotine dependence: Secondary | ICD-10-CM | POA: Insufficient documentation

## 2023-11-30 DIAGNOSIS — Z7984 Long term (current) use of oral hypoglycemic drugs: Secondary | ICD-10-CM | POA: Diagnosis not present

## 2023-11-30 DIAGNOSIS — E1151 Type 2 diabetes mellitus with diabetic peripheral angiopathy without gangrene: Secondary | ICD-10-CM | POA: Diagnosis present

## 2023-11-30 DIAGNOSIS — I132 Hypertensive heart and chronic kidney disease with heart failure and with stage 5 chronic kidney disease, or end stage renal disease: Secondary | ICD-10-CM | POA: Diagnosis not present

## 2023-11-30 DIAGNOSIS — I70245 Atherosclerosis of native arteries of left leg with ulceration of other part of foot: Secondary | ICD-10-CM

## 2023-11-30 DIAGNOSIS — N186 End stage renal disease: Secondary | ICD-10-CM | POA: Insufficient documentation

## 2023-11-30 DIAGNOSIS — I509 Heart failure, unspecified: Secondary | ICD-10-CM | POA: Insufficient documentation

## 2023-11-30 DIAGNOSIS — L98499 Non-pressure chronic ulcer of skin of other sites with unspecified severity: Secondary | ICD-10-CM | POA: Diagnosis not present

## 2023-11-30 DIAGNOSIS — I70212 Atherosclerosis of native arteries of extremities with intermittent claudication, left leg: Secondary | ICD-10-CM | POA: Diagnosis not present

## 2023-11-30 DIAGNOSIS — E1122 Type 2 diabetes mellitus with diabetic chronic kidney disease: Secondary | ICD-10-CM | POA: Diagnosis not present

## 2023-11-30 DIAGNOSIS — I7025 Atherosclerosis of native arteries of other extremities with ulceration: Secondary | ICD-10-CM

## 2023-11-30 DIAGNOSIS — Z7982 Long term (current) use of aspirin: Secondary | ICD-10-CM | POA: Diagnosis not present

## 2023-11-30 DIAGNOSIS — Z794 Long term (current) use of insulin: Secondary | ICD-10-CM | POA: Diagnosis not present

## 2023-11-30 HISTORY — PX: ABDOMINAL AORTOGRAM W/LOWER EXTREMITY: CATH118223

## 2023-11-30 HISTORY — PX: PERIPHERAL VASCULAR INTERVENTION: CATH118257

## 2023-11-30 HISTORY — PX: UPPER EXTREMITY ANGIOGRAPHY: CATH118270

## 2023-11-30 HISTORY — PX: PERIPHERAL VASCULAR BALLOON ANGIOPLASTY: CATH118281

## 2023-11-30 LAB — POCT I-STAT, CHEM 8
BUN: 33 mg/dL — ABNORMAL HIGH (ref 8–23)
Calcium, Ion: 1.11 mmol/L — ABNORMAL LOW (ref 1.15–1.40)
Chloride: 94 mmol/L — ABNORMAL LOW (ref 98–111)
Creatinine, Ser: 9.9 mg/dL — ABNORMAL HIGH (ref 0.44–1.00)
Glucose, Bld: 153 mg/dL — ABNORMAL HIGH (ref 70–99)
HCT: 54 % — ABNORMAL HIGH (ref 36.0–46.0)
Hemoglobin: 18.4 g/dL — ABNORMAL HIGH (ref 12.0–15.0)
Potassium: 3.1 mmol/L — ABNORMAL LOW (ref 3.5–5.1)
Sodium: 137 mmol/L (ref 135–145)
TCO2: 31 mmol/L (ref 22–32)

## 2023-11-30 LAB — GLUCOSE, CAPILLARY: Glucose-Capillary: 149 mg/dL — ABNORMAL HIGH (ref 70–99)

## 2023-11-30 SURGERY — ABDOMINAL AORTOGRAM W/LOWER EXTREMITY
Anesthesia: LOCAL

## 2023-11-30 MED ORDER — LIDOCAINE HCL (PF) 1 % IJ SOLN
INTRAMUSCULAR | Status: AC
  Filled 2023-11-30: qty 30

## 2023-11-30 MED ORDER — HYDRALAZINE HCL 20 MG/ML IJ SOLN: 5.0000 mg | INTRAMUSCULAR | Status: DC | PRN

## 2023-11-30 MED ORDER — FENTANYL CITRATE (PF) 100 MCG/2ML IJ SOLN
INTRAMUSCULAR | Status: AC
  Filled 2023-11-30: qty 2

## 2023-11-30 MED ORDER — HEPARIN SODIUM (PORCINE) 1000 UNIT/ML IJ SOLN
INTRAMUSCULAR | Status: AC
  Filled 2023-11-30: qty 10

## 2023-11-30 MED ORDER — MORPHINE SULFATE (PF) 2 MG/ML IV SOLN: 2.0000 mg | INTRAVENOUS | Status: DC | PRN

## 2023-11-30 MED ORDER — MIDAZOLAM HCL 2 MG/2ML IJ SOLN
INTRAMUSCULAR | Status: AC
Start: 2023-11-30 — End: ?
  Filled 2023-11-30: qty 2

## 2023-11-30 MED ORDER — LIDOCAINE HCL (PF) 1 % IJ SOLN
INTRAMUSCULAR | Status: DC | PRN
  Administered 2023-11-30: 10 mL via INTRADERMAL
  Administered 2023-11-30: 15 mL via INTRADERMAL

## 2023-11-30 MED ORDER — SODIUM CHLORIDE 0.9% FLUSH: 3.0000 mL | INTRAVENOUS | Status: DC | PRN

## 2023-11-30 MED ORDER — IODIXANOL 320 MG/ML IV SOLN
INTRAVENOUS | Status: DC | PRN
  Administered 2023-11-30: 140 mL via INTRA_ARTERIAL

## 2023-11-30 MED ORDER — MIDAZOLAM HCL 2 MG/2ML IJ SOLN
INTRAMUSCULAR | Status: DC | PRN
  Administered 2023-11-30: 1 mg via INTRAVENOUS

## 2023-11-30 MED ORDER — ACETAMINOPHEN 325 MG PO TABS
650.0000 mg | ORAL_TABLET | ORAL | Status: DC | PRN
Start: 2023-11-30 — End: 2023-11-30

## 2023-11-30 MED ORDER — CLOPIDOGREL BISULFATE 300 MG PO TABS
ORAL_TABLET | ORAL | Status: AC
  Filled 2023-11-30: qty 1

## 2023-11-30 MED ORDER — LABETALOL HCL 5 MG/ML IV SOLN: 10.0000 mg | INTRAVENOUS | Status: DC | PRN

## 2023-11-30 MED ORDER — FENTANYL CITRATE (PF) 100 MCG/2ML IJ SOLN
INTRAMUSCULAR | Status: DC | PRN
  Administered 2023-11-30: 50 ug via INTRAVENOUS

## 2023-11-30 MED ORDER — SODIUM CHLORIDE 0.9 % IV SOLN: INTRAVENOUS | Status: DC

## 2023-11-30 MED ORDER — ONDANSETRON HCL 4 MG/2ML IJ SOLN: 4.0000 mg | Freq: Four times a day (QID) | INTRAMUSCULAR | Status: DC | PRN

## 2023-11-30 MED ORDER — CLOPIDOGREL BISULFATE 75 MG PO TABS: 75.0000 mg | ORAL_TABLET | Freq: Every day | ORAL | 11 refills | Status: DC

## 2023-11-30 MED ORDER — HEPARIN SODIUM (PORCINE) 1000 UNIT/ML IJ SOLN
INTRAMUSCULAR | Status: DC | PRN
  Administered 2023-11-30: 3000 [IU] via INTRAVENOUS
  Administered 2023-11-30: 5000 [IU] via INTRAVENOUS

## 2023-11-30 MED ORDER — CLOPIDOGREL BISULFATE 300 MG PO TABS
ORAL_TABLET | ORAL | Status: DC | PRN
  Administered 2023-11-30: 300 mg via ORAL

## 2023-11-30 MED ORDER — OXYCODONE HCL 5 MG PO TABS
5.0000 mg | ORAL_TABLET | ORAL | Status: DC | PRN
Start: 2023-11-30 — End: 2023-11-30

## 2023-11-30 MED ORDER — SODIUM CHLORIDE 0.9 % IV SOLN: 250.0000 mL | INTRAVENOUS | Status: DC | PRN

## 2023-11-30 MED ORDER — SODIUM CHLORIDE 0.9% FLUSH
3.0000 mL | Freq: Two times a day (BID) | INTRAVENOUS | Status: DC
Start: 2023-11-30 — End: 2023-11-30

## 2023-11-30 SURGICAL SUPPLY — 25 items
BALLN STERLING OTW 3X60X150 (BALLOONS) ×3
BALLOON STERLING OTW 3X60X150 (BALLOONS) IMPLANT
CATH ANGIO 5F BER 100CM (CATHETERS) IMPLANT
CATH ANGIO 5F PIGTAIL 100CM (CATHETERS) IMPLANT
CATH CXI SUPP 2.6F 150 ANG (CATHETERS) IMPLANT
CATH OMNI FLUSH 5F 65CM (CATHETERS) IMPLANT
CLOSURE MYNX CONTROL 6F/7F (Vascular Products) IMPLANT
GUIDEWIRE ANGLED .035X150CM (WIRE) IMPLANT
KIT ENCORE 26 ADVANTAGE (KITS) IMPLANT
SET ATX-X65L (MISCELLANEOUS) IMPLANT
SHEATH CATAPULT 6FR 60 (SHEATH) IMPLANT
SHEATH PINNACLE 4F 10CM (SHEATH) IMPLANT
SHEATH PINNACLE 5F 10CM (SHEATH) IMPLANT
SHEATH PINNACLE 6F 10CM (SHEATH) IMPLANT
SHEATH PROBE COVER 6X72 (BAG) IMPLANT
STENT SYNERGY XD 3.50X38 (Permanent Stent) IMPLANT
STENT SYNERGY XD 3.50X48 (Permanent Stent) IMPLANT
SYNERGY XD 3.50X38 (Permanent Stent) ×3 IMPLANT
SYNERGY XD 3.50X48 (Permanent Stent) ×3 IMPLANT
TRAY PV CATH (CUSTOM PROCEDURE TRAY) ×3 IMPLANT
WIRE BENTSON .035X145CM (WIRE) IMPLANT
WIRE G V18X300CM (WIRE) IMPLANT
WIRE HI TORQ COMMND ES.014X300 (WIRE) IMPLANT
WIRE HI TORQ VERSACORE 300 (WIRE) IMPLANT
WIRE MICRO SET SILHO 5FR 7 (SHEATH) IMPLANT

## 2023-11-30 NOTE — Discharge Instructions (Signed)
 Femoral Site Care This sheet gives you information about how to care for yourself after your procedure. Your health care provider may also give you more specific instructions. If you have problems or questions, contact your health care provider. What can I expect after the procedure?  After the procedure, it is common to have: Bruising that usually fades within 1-2 weeks. Tenderness at the site. Follow these instructions at home: Wound care Follow instructions from your health care provider about how to take care of your insertion site. Make sure you: Wash your hands with soap and water before you change your bandage (dressing). If soap and water are not available, use hand sanitizer. Remove your dressing as told by your health care provider. In 24 hours Do not take baths, swim, or use a hot tub until your health care provider approves. You may shower 24-48 hours after the procedure or as told by your health care provider. Gently wash the site with plain soap and water. Pat the area dry with a clean towel. Do not rub the site. This may cause bleeding. Do not apply powder or lotion to the site. Keep the site clean and dry. Check your femoral site every day for signs of infection. Check for: Redness, swelling, or pain. Fluid or blood. Warmth. Pus or a bad smell. Activity For the first 2-3 days after your procedure, or as long as directed: Avoid climbing stairs as much as possible. Do not squat. Do not lift anything that is heavier than 10 lb (4.5 kg), or the limit that you are told, until your health care provider says that it is safe. For 5 days Rest as directed. Avoid sitting for a long time without moving. Get up to take short walks every 1-2 hours. Do not drive for 24 hours if you were given a medicine to help you relax (sedative). General instructions Take over-the-counter and prescription medicines only as told by your health care provider. Keep all follow-up visits as told by  your health care provider. This is important. Contact a health care provider if you have: A fever or chills. You have redness, swelling, or pain around your insertion site. Get help right away if: The catheter insertion area swells very fast. You pass out. You suddenly start to sweat or your skin gets clammy. The catheter insertion area is bleeding, and the bleeding does not stop when you hold steady pressure on the area. The area near or just beyond the catheter insertion site becomes pale, cool, tingly, or numb. These symptoms may represent a serious problem that is an emergency. Do not wait to see if the symptoms will go away. Get medical help right away. Call your local emergency services (911 in the [place] Do not drive yourself to the hospital. Summary After the procedure, it is common to have bruising that usually fades within 1-2 weeks. Check your femoral site every day for signs of infection. Do not lift anything that is heavier than 10 lb (4.5 kg), or the limit that you are told, until your health care provider says that it is safe. This information is not intended to replace advice given to you by your health care provider. Make sure you discuss any questions you have with your health care provider. Document Revised: 10/26/2017 Document Reviewed: 10/26/2017 Elsevier Patient Education  2020 [employer].

## 2023-11-30 NOTE — Progress Notes (Signed)
Dr Randie Heinz notified of inability to establish IV access by 4 RNS and ultrasound.  Instucted to bring pt over to cath lab and he will establish access

## 2023-11-30 NOTE — Progress Notes (Signed)
Unable to establish USGPIV x2 IV team RNs. Bedside RN notified.

## 2023-11-30 NOTE — Op Note (Signed)
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 Randie Heinz, MD Procedure Performed: 1.  Percutaneous ultrasound-guided access left common femoral vein 2.  Percutaneous ultrasound-guided access and Mynx device closure right common femoral artery 3.  Catheter in aorta and arch aortogram and abdominal aortogram with bilateral lower extremity angiography 4.  Selection of left axillary artery and left upper extremity angiogram 5.  Selection of left lower extremity anterior tibial artery and left lower extremity angiogram 6.  Stent of left anterior tibial artery with distal 3.5 x 48 mm Synergy and proximal 3.5 x 38 mm Synergy 7.  Moderate sedation with fentanyl and Versed for 74 minutes   Indications: 73 year old female with a history of end-stage renal disease currently dialyzing via peritoneal dialysis catheter.  She has a fistula in her left upper extremity which has not used but she does have a injury on her small finger does not have evidence of steal by duplex but is indicated for angiography.  As far as her left lower extremity goes she does have skin changes of the left second toe consistent with arterial insufficiency and a toe pressure of 17.  She is indicated for arch aortogram with left upper extremity angiography to evaluate for stenosis of her native arterial system versus need for ligation of her fistula and also angiography of the left lower extremity to treat atherosclerosis of the native arteries with toe pressure of 17.  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  The left subclavian artery axillary artery are patent.  After cannulation of the axillary artery the brachial artery is patent  gives rise to very diminutive ulnar artery which is dominant to the hand and does feel the arch and the radial artery contrast did not ever reach the arch.  The fistula comes off of the brachial artery above the antecubitum is very diminutive proximally within distally the fistula looks quite healthy.  From a left hand standpoint we can consider ligating the left arm AV fistula as it does not appear healthy but also does not appear to be significantly stealing from her left hand.  The abdominal aorta is patent and the right renal artery is identified but there is no filling of the kidneys distally.  The bilateral common and external iliac arteries and hypogastric arteries are patent as of the bilateral common femoral arteries.  On the left side which is the site of interest the SFA is calcified as is the popliteal artery and below the knee the peroneal and posterior tibial arteries occluded after a very diminutive tibioperoneal trunk.  The anterior tibial artery takeoff initially was diseased approximately 50% and then there is a long segment 70% stenosis which initially was ballooned but there was a dissection and despite continued ballooning attempts this was ultimately stented with 3.5 mm coronary stents x 2 and ultimately there was no residual stenosis or dissection and brisk runoff to the foot with a strong signal at the foot.  From a left lower extremity standpoint she will follow-up in 4 to 6 weeks with left lower extremity arterial duplex and ABIs and we will continue to monitor healing of the left second toe.   Procedure:  The patient was identified in the holding area and taken to room  8.  The patient was then placed supine on the table and prepped and draped in the usual sterile fashion.  A time out was called.  Ultrasound was used to evaluate the left common femoral vein which was patent and compressible and the area was anesthetized 1% lidocaine and cannulated with micropuncture needle followed  by wire and a sheath.  An ultrasound image saved the permanent record.  I placed a Bentson wire followed by a 4 French sheath and this was then used to administer moderate sedation as well as 3000 units of heparin.  Fentanyl and Versed was administered and vital signs were monitored throughout the case.  Ultrasound was then used to identify the right common femoral artery which was somewhat calcified posteriorly but proximally was patent without calcium and the area was anesthetized 1% lidocaine again cannulated with micropuncture needle followed by wire and a micropuncture sheath was placed and again an image was saved the permanent record.  Bentson wires placed followed by a 5 [place] sheath.  We then used a versa core and a pigtail catheter placed into the arch of the aorta which we had previously given 3000 units of heparin.  We then performed arch aortogram at LAO configuration.  A vertebral catheter was then used with versa core wire we selected the left subclavian and axillary arteries and perform left upper extremity angio with the above findings we elected no intervention.  We then removed the catheter and wire and placed an Omni catheter at the level of L1 and aortogram was performed we then crossed the bifurcation with Omni catheter and Glidewire and perform left lower extremity angiography and with the above findings we took a catheter down to the knee and performed additional angiography and then placed a versa core wire down to the level of the knee placed a long 6 French sheath and additional 5000 units of heparin was administered.  Additional angiographic views were obtained and we elected for intervention of the anterior tibial artery.  The vert catheter was used to select the anterior tibial artery with a 1 8 wire which was a V18 and this across the stenosis and was with confirmed intraluminal access.  We then primarily balloon angioplastied the anterior tibial artery in 2 areas with a 3 mm balloon  but at completion there was a dissection we attempted long inflation at low pressure but again still dissection.  We exchanged for an 014 wire through the balloon and then primarily stented the anterior tibial artery heading back from distal to proximal with a 3.5 x 48 mm Synergy followed by 3.5 by 38 mm Synergy.  Completion demonstrated no residual stenosis or dissection.  Additional angiographic views of the left popliteal and SFA were obtained and there was no need for any further intervention.  We exchanged for a short 6 French sheath and Mynx device was deployed.  The sheath in the left groin was pulled and pressure was held until hemostasis obtained.  The patient tolerated the procedure without immediate complication.  Contrast: 140cc    Shia Delaine C. Randie Heinz, MD [facility] Office: [phone] Pager: [phone]

## 2023-11-30 NOTE — Interval H&P Note (Signed)
History and Physical Interval Note:  11/30/2023 6:55 AM  Melissa Howe  has presented today for surgery, with the diagnosis of instage renal.  The various methods of treatment have been discussed with the patient and family. After consideration of risks, benefits and other options for treatment, the patient has consented to  Procedure(s): ABDOMINAL AORTOGRAM W/LOWER EXTREMITY (N/A) Upper Extremity Angiography (Left) as a surgical intervention.  The patient's history has been reviewed, patient examined, no change in status, stable for surgery.  I have reviewed the patient's chart and labs.  Questions were answered to the patient's satisfaction.     Lemar Livings

## 2023-12-01 ENCOUNTER — Encounter (HOSPITAL_COMMUNITY): Payer: Self-pay | Admitting: Vascular Surgery

## 2023-12-25 ENCOUNTER — Encounter (HOSPITAL_COMMUNITY): Payer: Self-pay

## 2023-12-28 ENCOUNTER — Encounter (HOSPITAL_COMMUNITY): Payer: Self-pay

## 2023-12-28 ENCOUNTER — Telehealth: Payer: Self-pay | Admitting: Student

## 2023-12-28 ENCOUNTER — Emergency Department
Admission: EM | Admit: 2023-12-28 | Discharge: 2023-12-28 | Disposition: A | Discharge status: Home / Self Care | Attending: Emergency Medicine | Admitting: Emergency Medicine

## 2023-12-28 ENCOUNTER — Emergency Department

## 2023-12-28 ENCOUNTER — Other Ambulatory Visit: Payer: Self-pay

## 2023-12-28 DIAGNOSIS — Z992 Dependence on renal dialysis: Secondary | ICD-10-CM | POA: Insufficient documentation

## 2023-12-28 DIAGNOSIS — Y92512 Supermarket, store or market as the place of occurrence of the external cause: Secondary | ICD-10-CM | POA: Insufficient documentation

## 2023-12-28 DIAGNOSIS — R519 Headache, unspecified: Secondary | ICD-10-CM | POA: Diagnosis present

## 2023-12-28 DIAGNOSIS — W010XXA Fall on same level from slipping, tripping and stumbling without subsequent striking against object, initial encounter: Secondary | ICD-10-CM | POA: Diagnosis not present

## 2023-12-28 DIAGNOSIS — N186 End stage renal disease: Secondary | ICD-10-CM | POA: Diagnosis not present

## 2023-12-28 DIAGNOSIS — I509 Heart failure, unspecified: Secondary | ICD-10-CM | POA: Diagnosis not present

## 2023-12-28 DIAGNOSIS — E119 Type 2 diabetes mellitus without complications: Secondary | ICD-10-CM | POA: Insufficient documentation

## 2023-12-28 DIAGNOSIS — W19XXXA Unspecified fall, initial encounter: Secondary | ICD-10-CM

## 2023-12-28 MED ORDER — ACETAMINOPHEN 325 MG PO TABS
650.0000 mg | ORAL_TABLET | Freq: Once | ORAL | Status: AC
Start: 2023-12-28 — End: 2023-12-28
  Administered 2023-12-28: 650 mg via ORAL
  Filled 2023-12-28: qty 2

## 2023-12-28 NOTE — ED Provider Notes (Signed)
 [facility] Provider Note    Event Date/Time   First MD Initiated Contact with Patient 12/28/23 1200     (approximate)   History   Fall   HPI  Melissa Howe is a 73 y.o. female with PMH of hide), diabetes, CHF, ESRD on dialysis presents for evaluation after a fall today while grocery shopping.  Patient was pushing a cart which went down some steps causing her to trip and fall over top of the cart.  She reports a headache at this time but denies other complaints like nausea, blurry vision.  She did not pass out and she does not take any blood thinners.  She did not feel dizzy or lightheaded prior to the fall.      Physical Exam   Triage Vital Signs: ED Triage Vitals [12/28/23 1133]  Encounter Vitals Group     BP 127/83     Systolic BP Percentile      Diastolic BP Percentile      Pulse Rate (!) 102     Resp 18     Temp 97.8 F (36.6 C)     Temp Source Oral     SpO2 99 %     Weight 169 lb (76.7 kg)     Height 5\' 4"  (1.626 m)     Head Circumference      Peak Flow      Pain Score 9     Pain Loc      Pain Education      Exclude from Growth Chart     Most recent vital signs: Vitals:   12/28/23 1133  BP: 127/83  Pulse: (!) 102  Resp: 18  Temp: 97.8 F (36.6 C)  SpO2: 99%   General: Awake, no distress.  CV:  Good peripheral perfusion.  Resp:  Normal effort.  Abd:  No distention.  Other:  PERRL, EOM intact, no focal neurodeficits.  Abrasion to the left forehead with some localized swelling that is tender to palpation but no crepitus.   ED Results / Procedures / Treatments   Labs (all labs ordered are listed, but only abnormal results are displayed) Labs Reviewed - No data to display   RADIOLOGY  CT of the head and cervical spine obtained, I interpreted the images as  negative for any acute intracranial abnormalities, fractures of the cervical spine and traumatic alignment of the cervical spine. Radiology read is  pending.   PROCEDURES:  Critical Care performed: No  Procedures   MEDICATIONS ORDERED IN ED: Medications  acetaminophen (TYLENOL) tablet 650 mg (has no administration in time range)     IMPRESSION / MDM / ASSESSMENT AND PLAN / ED COURSE  I reviewed the triage vital signs and the nursing notes.                             73 year old female presents for evaluation after a fall.  Patient was tachycardic otherwise vital signs are stable.  Patient NAD on exam.  Differential diagnosis includes, but is not limited to, closed head injury, skull fracture, intracranial bleed, cervical spine fracture, muscle strain, abrasion, contusion.  Patient's presentation is most consistent with acute complicated illness / injury requiring diagnostic workup.  Physical exam is reassuring, no focal neurodeficits.  Patient only reports a headache.  We discussed taking Tylenol to manage her symptoms.    CT head and cervical spine are pending radiology read.  Patient needs  to leave to go to dialysis, my suspicion for an intracranial bleed or cervical spine fracture is very low.  I reviewed the images myself and did not see any acute abnormalities.  I will reach out to the patient and family member with results once they come back.  They are aware that if there is a positive finding they may need to come back and we will have to start the ER process over again.  Patient voiced understanding, all questions were answered and she was stable at discharge.      FINAL CLINICAL IMPRESSION(S) / ED DIAGNOSES   Final diagnoses:  Fall, initial encounter     Rx / DC Orders   ED Discharge Orders     None        Note:  This document was prepared using Dragon voice recognition software and may include unintentional dictation errors.   Cameron Ali, PA-C 12/28/23 1405    Merwyn Katos, MD 12/28/23 1539

## 2023-12-28 NOTE — Telephone Encounter (Cosign Needed)
 Patient needed to leave for dialysis before I received results of her CT head and Ct cervical spine. I called to inform the patient's family member and patient who was with them that the imaging was negative for any acute abnormalities. I was able to successfully complete the call.

## 2023-12-28 NOTE — ED Notes (Addendum)
 Pt was out shopping and pushing a cart, per family pt didn't see the stairs in front of them and the cart went down the stairs, then the pt fell down the stairs and "flipped over the cart" where the pt's head hit some metal shelves. Pt also c/o pain in their left pinky finger. Pt in the head is a 9/10, and the finger pain comes and goes. Pt has issues with finger and is supposed to see their PCP about the finger and arm because they cut their finger and is having trouble getting it to heal. Pt also would like their catheter/fistula checked to make sure it wasn't damaged in the fall. Pt is A&Ox4 and in NAD.

## 2023-12-28 NOTE — ED Triage Notes (Signed)
 EMS states patient tripped and fell while out shopping, hit head on metal shelving; no LOC, no blood thinners. Patient reports 9/10 headache and also complaining of pain to 5th digit on left hand.

## 2023-12-28 NOTE — Discharge Instructions (Signed)
 Please watch for a phone call regarding the results of your CT scan.  If there is a positive finding we may recommend that you return to the emergency department.

## 2023-12-29 ENCOUNTER — Telehealth: Payer: Self-pay

## 2023-12-29 ENCOUNTER — Other Ambulatory Visit: Payer: Self-pay

## 2023-12-29 DIAGNOSIS — N186 End stage renal disease: Secondary | ICD-10-CM

## 2023-12-29 NOTE — Telephone Encounter (Signed)
 Patient requested that we call her daughter, Aggie Cosier, for surgery scheduling.  LVM.

## 2023-12-29 NOTE — Telephone Encounter (Addendum)
 Triage:  -Daughter Crystal called reporting her mom's L 5 th finger is getting worse.  -spoke to pt who stated that her finger was worse that it is under the nail and the pain is getting terrible, mostly intermittent but frequent.  She wanted to get an her appts moved up from March 12 th although this is to have studies on her lower extremities and not her hand.   -pt states Dr. Randie Heinz had told her that the fistula was not any good anymore and he was going to have to fix it.  -sent message to Dr. Randie Heinz - Schedule ligation of fistula versus office evaluation. -unable to reach daughter-Only VM  -pt confirms she wished to proceed with ligation of fistula.  She will tell her daughter.  Info sent to scheduler

## 2023-12-31 ENCOUNTER — Other Ambulatory Visit: Payer: Self-pay

## 2023-12-31 DIAGNOSIS — I739 Peripheral vascular disease, unspecified: Secondary | ICD-10-CM

## 2024-01-04 ENCOUNTER — Encounter (HOSPITAL_BASED_OUTPATIENT_CLINIC_OR_DEPARTMENT_OTHER): Payer: Self-pay

## 2024-01-04 ENCOUNTER — Emergency Department (HOSPITAL_BASED_OUTPATIENT_CLINIC_OR_DEPARTMENT_OTHER)

## 2024-01-04 ENCOUNTER — Emergency Department (HOSPITAL_BASED_OUTPATIENT_CLINIC_OR_DEPARTMENT_OTHER)
Admission: EM | Admit: 2024-01-04 | Discharge: 2024-01-04 | Disposition: A | Discharge status: Home / Self Care | Attending: Emergency Medicine | Admitting: Emergency Medicine

## 2024-01-04 ENCOUNTER — Other Ambulatory Visit: Payer: Self-pay

## 2024-01-04 DIAGNOSIS — Z7982 Long term (current) use of aspirin: Secondary | ICD-10-CM | POA: Insufficient documentation

## 2024-01-04 DIAGNOSIS — N186 End stage renal disease: Secondary | ICD-10-CM | POA: Insufficient documentation

## 2024-01-04 DIAGNOSIS — W010XXA Fall on same level from slipping, tripping and stumbling without subsequent striking against object, initial encounter: Secondary | ICD-10-CM | POA: Insufficient documentation

## 2024-01-04 DIAGNOSIS — Z79899 Other long term (current) drug therapy: Secondary | ICD-10-CM | POA: Insufficient documentation

## 2024-01-04 DIAGNOSIS — Z992 Dependence on renal dialysis: Secondary | ICD-10-CM | POA: Diagnosis not present

## 2024-01-04 DIAGNOSIS — S0083XA Contusion of other part of head, initial encounter: Secondary | ICD-10-CM | POA: Insufficient documentation

## 2024-01-04 DIAGNOSIS — Z7902 Long term (current) use of antithrombotics/antiplatelets: Secondary | ICD-10-CM | POA: Diagnosis not present

## 2024-01-04 DIAGNOSIS — Z794 Long term (current) use of insulin: Secondary | ICD-10-CM | POA: Diagnosis not present

## 2024-01-04 DIAGNOSIS — J3489 Other specified disorders of nose and nasal sinuses: Secondary | ICD-10-CM | POA: Diagnosis not present

## 2024-01-04 DIAGNOSIS — R519 Headache, unspecified: Secondary | ICD-10-CM | POA: Diagnosis present

## 2024-01-04 MED ORDER — ACETAMINOPHEN ER 650 MG PO TBCR: 650.0000 mg | EXTENDED_RELEASE_TABLET | Freq: Four times a day (QID) | ORAL | 0 refills | Status: DC | PRN

## 2024-01-04 MED ORDER — LIDOCAINE 5 % EX OINT: 1.0000 | TOPICAL_OINTMENT | Freq: Two times a day (BID) | CUTANEOUS | 0 refills | Status: AC | PRN

## 2024-01-04 MED ORDER — LIDOCAINE 5 % EX OINT: 1.0000 | TOPICAL_OINTMENT | Freq: Two times a day (BID) | CUTANEOUS | 0 refills | Status: DC | PRN

## 2024-01-04 MED ORDER — ACETAMINOPHEN 500 MG PO TABS
500.0000 mg | ORAL_TABLET | Freq: Once | ORAL | Status: AC
  Administered 2024-01-04: 500 mg via ORAL
  Filled 2024-01-04: qty 1

## 2024-01-04 MED ORDER — ACETAMINOPHEN 500 MG PO TABS: 1000.0000 mg | ORAL_TABLET | Freq: Once | ORAL | Status: DC

## 2024-01-04 NOTE — ED Triage Notes (Signed)
 Pt states she fell last Monday, seen for same; advises that pain has gotten greater since, concern for nasal fx. Follow up w PCP today approx 4p, advises "I just couldn't take the pain.   Ibuprofen 200mg  for pain, no relief.

## 2024-01-04 NOTE — ED Provider Notes (Signed)
 [place] [facility]GENCY DEPARTMENT AT [facility] Provider Note   CSN: [id number] Arrival date & time: 01/04/24  1345     History  Chief Complaint  Patient presents with   Fall    Monday 3/3    Melissa Howe is a 73 y.o. female.  HPI    73 year old female with history of ESRD on home dialysis comes in with chief complaint of facial pain.  Patient had a mechanical fall 1 week ago, when she fell forward, struck her face onto shelves and then concrete surface.  Patient was subsequently seen at outside hospital, CT scan of her brain was negative.  Patient states that she has had persistent headache, nose pain since then.  She has been taking ibuprofen without relief. Patient denies any bleeding problem.  Home Medications Prior to Admission medications   Medication Sig Start Date End Date Taking? Authorizing Provider  acetaminophen (TYLENOL 8 HOUR) 650 MG CR tablet Take 1 tablet (650 mg total) by mouth every 6 (six) hours as needed for pain or fever. 01/04/24   Derwood Kaplan, MD  aspirin 81 MG chewable tablet Chew 1 tablet (81 mg total) by mouth daily. 07/13/21   Ghimire, Werner Lean, MD  atorvastatin (LIPITOR) 80 MG tablet Take 80 mg by mouth at bedtime.    [provider]  calcitRIOL (ROCALTROL) 0.5 MCG capsule Take 0.5 mcg by mouth daily.    [provider]  cinacalcet (SENSIPAR) 30 MG tablet Take 30 mg by mouth daily. With snack    [provider]  clopidogrel (PLAVIX) 75 MG tablet Take 1 tablet (75 mg total) by mouth daily. 11/30/23 11/29/24  Maeola Harman, MD  gabapentin (NEURONTIN) 100 MG capsule Take 100 mg by mouth 2 (two) times daily. 11/02/23   [provider]  Hyprom-Naphaz-Polysorb-Zn Sulf (CLEAR EYES COMPLETE OP) Place 1 drop into both eyes daily as needed (dry eyes).    [provider]  Insulin Glargine Solostar (LANTUS) 100 UNIT/ML Solostar Pen Inject 20 Units into the skin daily. 11/11/22   [provider]  Insulin Pen Needle 32G X 4 MM MISC Use 4x a day 06/28/23   Thapa, [place], MD  isosorbide mononitrate (IMDUR) 30 MG 24 hr tablet Take 1 tablet (30 mg total) by mouth daily. Patient not taking: Reported on 01/01/2024 07/13/21   Maretta Bees, MD  lidocaine (XYLOCAINE) 5 % ointment Apply 1 Application topically 2 (two) times daily as needed for up to 5 days. 01/04/24 01/09/24  Derwood Kaplan, MD  linagliptin (TRADJENTA) 5 MG TABS tablet Take 1 tablet (5 mg total) by mouth daily. 06/26/23   Thapa, [place], MD  magnesium gluconate (MAGONATE) 500 MG tablet Take 500 mg by mouth 2 (two) times a week.    [provider]  potassium chloride SA (KLOR-CON M) 20 MEQ tablet Take 20 mEq by mouth daily.    [provider]  sevelamer carbonate (RENVELA) 800 MG tablet Take 800-3,200 mg by mouth See admin instructions. 3200 mg with meals and 800 mg with snacks    [provider]  triamcinolone lotion (KENALOG) 0.1 % Apply 1 Application topically 2 (two) times daily as needed (irritation).    [provider]      Allergies    Patient has no known allergies.    Review of Systems   Review of Systems  All other systems reviewed and are negative.   Physical Exam Updated Vital Signs BP (!) 162/82 (BP Location: Right Arm)  Pulse 78   Temp 97.7 F (36.5 C) (Oral)   Resp 15   SpO2 100%  Physical Exam Vitals and nursing note reviewed.  Constitutional:      Appearance: She is well-developed.  HENT:     Head: Atraumatic.     Nose:     Comments: Patient has mild edema over the superior part of her nasal bridge, no gross deformity noted.  Patient also has a bump over her forehead Cardiovascular:     Rate and Rhythm: Normal rate.  Pulmonary:     Effort: Pulmonary effort is normal.  Musculoskeletal:     Cervical back: Normal range of motion and neck supple.  Skin:    General: Skin is warm and dry.  Neurological:     Mental Status: She is alert and oriented to person,  place, and time.     ED Results / Procedures / Treatments   Labs (all labs ordered are listed, but only abnormal results are displayed) Labs Reviewed - No data to display  EKG None  Radiology CT Head Wo Contrast Result Date: 01/04/2024 CLINICAL DATA:  Head trauma, minor (Age >= 65y); Facial trauma, blunt; Neck trauma (Age >= 65y). Worsening pain after a fall 1 week ago. EXAM: CT HEAD WITHOUT CONTRAST CT MAXILLOFACIAL WITHOUT CONTRAST CT CERVICAL SPINE WITHOUT CONTRAST TECHNIQUE: Multidetector CT imaging of the head, cervical spine, and maxillofacial structures were performed using the standard protocol without intravenous contrast. Multiplanar CT image reconstructions of the cervical spine and maxillofacial structures were also generated. RADIATION DOSE REDUCTION: This exam was performed according to the departmental dose-optimization program which includes automated exposure control, adjustment of the mA and/or kV according to patient size and/or use of iterative reconstruction technique. COMPARISON:  CT head and cervical spine 12/28/2023 FINDINGS: CT HEAD FINDINGS Brain: There is no evidence of an acute infarct, intracranial hemorrhage, mass, midline shift, or extra-axial fluid collection. Cerebral volume is within normal limits for age. The ventricles are normal in size. Cerebral white matter hypodensities are unchanged and nonspecific but compatible with mild chronic small vessel ischemic disease. Vascular: Calcified atherosclerosis at the skull base. No hyperdense vessel. Skull: No acute fracture or suspicious lesion. Other: None. CT MAXILLOFACIAL FINDINGS Osseous: No acute fracture, mandibular dislocation, or suspicious lesion. Orbits: Postoperative changes to the globes. No acute traumatic finding. Sinuses: Paranasal sinuses and mastoid air cells are clear. Soft tissues: Mild left supraorbital soft tissue swelling. CT CERVICAL SPINE FINDINGS Alignment: Normal. Skull base and vertebrae: No acute  fracture or suspicious lesion. Mild degenerative endplate changes in the lower cervical spine including small Schmorl's nodes. Soft tissues and spinal canal: No prevertebral fluid or swelling. No visible canal hematoma. Disc levels: Mild lower cervical disc degeneration and upper cervical facet arthrosis. No evidence of high-grade spinal canal or neural foraminal stenosis. Upper chest: Clear lung apices. Other: Mild atherosclerotic calcification at the carotid bifurcations. IMPRESSION: 1. No evidence of acute intracranial abnormality. 2. No acute maxillofacial or cervical spine fracture. Electronically Signed   By: Sebastian Ache M.D.   On: 01/04/2024 16:37   CT Maxillofacial Wo Contrast Result Date: 01/04/2024 CLINICAL DATA:  Head trauma, minor (Age >= 65y); Facial trauma, blunt; Neck trauma (Age >= 65y). Worsening pain after a fall 1 week ago. EXAM: CT HEAD WITHOUT CONTRAST CT MAXILLOFACIAL WITHOUT CONTRAST CT CERVICAL SPINE WITHOUT CONTRAST TECHNIQUE: Multidetector CT imaging of the head, cervical spine, and maxillofacial structures were performed using the standard protocol without intravenous contrast. Multiplanar CT image reconstructions of the  cervical spine and maxillofacial structures were also generated. RADIATION DOSE REDUCTION: This exam was performed according to the departmental dose-optimization program which includes automated exposure control, adjustment of the mA and/or kV according to patient size and/or use of iterative reconstruction technique. COMPARISON:  CT head and cervical spine 12/28/2023 FINDINGS: CT HEAD FINDINGS Brain: There is no evidence of an acute infarct, intracranial hemorrhage, mass, midline shift, or extra-axial fluid collection. Cerebral volume is within normal limits for age. The ventricles are normal in size. Cerebral white matter hypodensities are unchanged and nonspecific but compatible with mild chronic small vessel ischemic disease. Vascular: Calcified atherosclerosis at  the skull base. No hyperdense vessel. Skull: No acute fracture or suspicious lesion. Other: None. CT MAXILLOFACIAL FINDINGS Osseous: No acute fracture, mandibular dislocation, or suspicious lesion. Orbits: Postoperative changes to the globes. No acute traumatic finding. Sinuses: Paranasal sinuses and mastoid air cells are clear. Soft tissues: Mild left supraorbital soft tissue swelling. CT CERVICAL SPINE FINDINGS Alignment: Normal. Skull base and vertebrae: No acute fracture or suspicious lesion. Mild degenerative endplate changes in the lower cervical spine including small Schmorl's nodes. Soft tissues and spinal canal: No prevertebral fluid or swelling. No visible canal hematoma. Disc levels: Mild lower cervical disc degeneration and upper cervical facet arthrosis. No evidence of high-grade spinal canal or neural foraminal stenosis. Upper chest: Clear lung apices. Other: Mild atherosclerotic calcification at the carotid bifurcations. IMPRESSION: 1. No evidence of acute intracranial abnormality. 2. No acute maxillofacial or cervical spine fracture. Electronically Signed   By: Sebastian Ache M.D.   On: 01/04/2024 16:37   CT Cervical Spine Wo Contrast Result Date: 01/04/2024 CLINICAL DATA:  Head trauma, minor (Age >= 65y); Facial trauma, blunt; Neck trauma (Age >= 65y). Worsening pain after a fall 1 week ago. EXAM: CT HEAD WITHOUT CONTRAST CT MAXILLOFACIAL WITHOUT CONTRAST CT CERVICAL SPINE WITHOUT CONTRAST TECHNIQUE: Multidetector CT imaging of the head, cervical spine, and maxillofacial structures were performed using the standard protocol without intravenous contrast. Multiplanar CT image reconstructions of the cervical spine and maxillofacial structures were also generated. RADIATION DOSE REDUCTION: This exam was performed according to the departmental dose-optimization program which includes automated exposure control, adjustment of the mA and/or kV according to patient size and/or use of iterative  reconstruction technique. COMPARISON:  CT head and cervical spine 12/28/2023 FINDINGS: CT HEAD FINDINGS Brain: There is no evidence of an acute infarct, intracranial hemorrhage, mass, midline shift, or extra-axial fluid collection. Cerebral volume is within normal limits for age. The ventricles are normal in size. Cerebral white matter hypodensities are unchanged and nonspecific but compatible with mild chronic small vessel ischemic disease. Vascular: Calcified atherosclerosis at the skull base. No hyperdense vessel. Skull: No acute fracture or suspicious lesion. Other: None. CT MAXILLOFACIAL FINDINGS Osseous: No acute fracture, mandibular dislocation, or suspicious lesion. Orbits: Postoperative changes to the globes. No acute traumatic finding. Sinuses: Paranasal sinuses and mastoid air cells are clear. Soft tissues: Mild left supraorbital soft tissue swelling. CT CERVICAL SPINE FINDINGS Alignment: Normal. Skull base and vertebrae: No acute fracture or suspicious lesion. Mild degenerative endplate changes in the lower cervical spine including small Schmorl's nodes. Soft tissues and spinal canal: No prevertebral fluid or swelling. No visible canal hematoma. Disc levels: Mild lower cervical disc degeneration and upper cervical facet arthrosis. No evidence of high-grade spinal canal or neural foraminal stenosis. Upper chest: Clear lung apices. Other: Mild atherosclerotic calcification at the carotid bifurcations. IMPRESSION: 1. No evidence of acute intracranial abnormality. 2. No acute maxillofacial or cervical  spine fracture. Electronically Signed   By: Sebastian Ache M.D.   On: 01/04/2024 16:37    Procedures Procedures    Medications Ordered in ED Medications  acetaminophen (TYLENOL) tablet 1,000 mg (has no administration in time range)  acetaminophen (TYLENOL) tablet 500 mg (500 mg Oral Given 01/04/24 1710)    ED Course/ Medical Decision Making/ A&P                                 Medical Decision  Making Risk OTC drugs. Prescription drug management.   73 year old patient comes in with chief complaint of mechanical fall and persistent swelling to her nose along with pain and forehead pain, headache.  I reviewed patient's CT scan of the brain and face that were ordered from triage.  CT max face does not show any evidence of nasal fracture.  There is no significant deformity on CT scan either.  CT scan of the brain is negative for brain bleed.  Differential diagnosis for this patient includes persistent hematoma, contusion, concussion.  Discussed with the patient that she needs to be on Tylenol for pain control along with topical lidocaine that we will prescribe. If she decides to take NSAID, it cannot be more than for 2 or 3 days.  Patient is supposed to get vascular procedure tomorrow, so I advised that she do not take any ibuprofen until and if cleared by vascular surgery.  Final Clinical Impression(s) / ED Diagnoses Final diagnoses:  Facial contusion, initial encounter    Rx / DC Orders ED Discharge Orders          Ordered    acetaminophen (TYLENOL 8 HOUR) 650 MG CR tablet  Every 6 hours PRN,   Status:  Discontinued        01/04/24 1705    lidocaine (XYLOCAINE) 5 % ointment  2 times daily PRN,   Status:  Discontinued        01/04/24 1705    acetaminophen (TYLENOL 8 HOUR) 650 MG CR tablet  Every 6 hours PRN        01/04/24 1710    lidocaine (XYLOCAINE) 5 % ointment  2 times daily PRN        01/04/24 1710              Derwood Kaplan, MD 01/04/24 1718

## 2024-01-04 NOTE — Progress Notes (Signed)
 Called to go over instructions for surgery on 3/11 with pt. She is in the emergency dept r/t to a fall at the grocery store today. She said she didn't see a step and fell. She said she hit her head and her nose. I advised pt that I would call the surgeon's office and let them know and her surgery may be rescheduled.

## 2024-01-04 NOTE — Discharge Instructions (Signed)
 You are seen in the ER for facial injury.  CT scans are reassuring.  Alternate between cold and warm compresses to your nose. Take Tylenol every 6 hours as needed for pain. Apply lidocaine gel for additional pain relief.  Given your dialysis history, it is not safe to be on ibuprofen long-term.  It might be used for 2 or 3 days only, but only if vascular surgeon allows you to do so after the surgery tomorrow.

## 2024-01-04 NOTE — Progress Notes (Addendum)
 Spoke with Marchelle Folks, RN. Pt will be cancelled tomorrow per Dr. Randie Heinz. I called pt's daughter Crystal and let her know that they will be calling to get her rescheduled. Marchelle Folks will let the OR know.

## 2024-01-05 ENCOUNTER — Encounter (HOSPITAL_COMMUNITY): Payer: Self-pay | Admitting: Vascular Surgery

## 2024-01-05 ENCOUNTER — Encounter (HOSPITAL_COMMUNITY): Admission: RE | Payer: Self-pay | Source: Home / Self Care

## 2024-01-05 ENCOUNTER — Ambulatory Visit (HOSPITAL_COMMUNITY): Admission: RE | Admit: 2024-01-05 | Source: Home / Self Care | Admitting: Vascular Surgery

## 2024-01-05 SURGERY — LIGATION OF COMPETING BRANCHES OF ARTERIOVENOUS FISTULA
Anesthesia: Choice | Laterality: Left

## 2024-01-06 ENCOUNTER — Ambulatory Visit (HOSPITAL_COMMUNITY)
Admission: RE | Admit: 2024-01-06 | Discharge: 2024-01-06 | Disposition: A | Discharge status: Home / Self Care | Payer: Medicare HMO | Source: Ambulatory Visit | Attending: Vascular Surgery | Admitting: Vascular Surgery

## 2024-01-06 ENCOUNTER — Ambulatory Visit (INDEPENDENT_AMBULATORY_CARE_PROVIDER_SITE_OTHER): Payer: Medicare HMO | Admitting: Physician Assistant

## 2024-01-06 ENCOUNTER — Ambulatory Visit (INDEPENDENT_AMBULATORY_CARE_PROVIDER_SITE_OTHER)
Admission: RE | Admit: 2024-01-06 | Discharge: 2024-01-06 | Disposition: A | Discharge status: Home / Self Care | Payer: Medicare HMO | Source: Ambulatory Visit | Attending: Vascular Surgery

## 2024-01-06 ENCOUNTER — Other Ambulatory Visit: Payer: Self-pay

## 2024-01-06 VITALS — BP 144/88 | HR 87 | Temp 98.0°F | Resp 18 | Ht 64.0 in | Wt 176.5 lb

## 2024-01-06 DIAGNOSIS — I739 Peripheral vascular disease, unspecified: Secondary | ICD-10-CM | POA: Diagnosis present

## 2024-01-06 DIAGNOSIS — N186 End stage renal disease: Secondary | ICD-10-CM | POA: Diagnosis not present

## 2024-01-06 LAB — VAS US ABI WITH/WO TBI

## 2024-01-06 MED ORDER — CLOPIDOGREL BISULFATE 75 MG PO TABS: 75.0000 mg | ORAL_TABLET | Freq: Every day | ORAL | 11 refills | Status: DC

## 2024-01-06 NOTE — Progress Notes (Signed)
 HISTORY AND PHYSICAL     CC:  follow up. Requesting Provider:  Raymon Mutton., FNP  HPI: This is a 73 y.o. female who is here today for follow up for PAD.  Pt has hx of angiogram with stent of left ATA on 11/30/2023 by Dr. Randie Heinz for 2nd toe ulceration.  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 The fistula did not appear healthy but did not appear to have significant steal from the left hand.  She has hx of end-stage renal disease currently dialyzing 3 times a day with peritoneal dialysis catheter that was placed last July. 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   The pt returns today for follow up.  She states she is doing well.  She denies any claudication or rest pain.  She states that her left 2nd toe is about the same.  She states that she continues to have ulceration of the left 5th finger.  She denies pain in the left hand. Unsure if she has been taking Plavix.    The pt is on a statin for cholesterol management.    The pt is on an aspirin.    Other AC:  plavix The pt is not on medication for hypertension.  The pt is  on medication for diabetes. Tobacco hx:  former   Past Medical History:  Diagnosis Date   Anemia    CHF (congestive heart failure) (HCC)    Chronic kidney disease    Constipation    Coronary artery disease    Diabetes mellitus    Type II   Dyspnea    with exertion   ESRD (end stage renal disease) (HCC)    History of blood transfusion    Hyperlipemia    Hypertension    Ischemic cardiomyopathy    Myocardial infarction [facility])    Peripheral vascular disease (HCC)    Pneumonia 2019    Past Surgical History:  Procedure Laterality Date   ABDOMINAL AORTOGRAM W/LOWER EXTREMITY N/A 10/15/2022   Procedure:  ABDOMINAL AORTOGRAM W/LOWER EXTREMITY;  Surgeon: Victorino Sparrow, MD;  Location: [facility] INVASIVE CV LAB;  Service: Cardiovascular;  Laterality: N/A;   ABDOMINAL AORTOGRAM W/LOWER EXTREMITY N/A 11/30/2023   Procedure: ABDOMINAL AORTOGRAM W/LOWER EXTREMITY;  Surgeon: Maeola Harman, MD;  Location: [facility] INVASIVE CV LAB;  Service: Cardiovascular;  Laterality: N/A;   APPENDECTOMY     AV FISTULA PLACEMENT Left 10/05/2019   Procedure: INSERTION OF ARTERIOVENOUS (AV) GORE-TEX VASCULAR GRAFT LEFT ARM;  Surgeon: Larina Earthly, MD;  Location: [facility] OR;  Service: Vascular;  Laterality: Left;   AV FISTULA PLACEMENT Left 02/27/2021   Procedure: LEFT ARM FIRST STAGE BASILIC VEIN  ARTERIOVENOUS (AV) FISTULA CREATION;  Surgeon: Maeola Harman, MD;  Location: [facility] OR;  Service: Vascular;  Laterality: Left;   BASCILIC VEIN TRANSPOSITION Left 04/17/2021   Procedure: LEFT SECOND STAGE BASCILIC VEIN TRANSPOSITION;  Surgeon: Maeola Harman, MD;  Location: [facility] OR;  Service: Vascular;  Laterality: Left;   BIOPSY  07/24/2020   Procedure: BIOPSY;  Surgeon: Sherrilyn Rist, MD;  Location: [facility] ENDOSCOPY;  Service: Gastroenterology;;   BUBBLE STUDY  01/02/2020   Procedure: BUBBLE STUDY;  Surgeon: Parke Poisson, MD;  Location: [facility] ENDOSCOPY;  Service: Cardiology;;  CAPD INSERTION N/A 05/01/2023   Procedure: LAPAROSCOPIC INSERTION CONTINUOUS AMBULATORY PERITONEAL DIALYSIS  (CAPD) CATHETER;  Surgeon: Maeola Harman, MD;  Location: [facility] OR;  Service: Vascular;  Laterality: N/A;   CARDIAC CATHETERIZATION     COLONOSCOPY WITH PROPOFOL N/A 07/24/2020   Procedure: COLONOSCOPY WITH PROPOFOL;  Surgeon: Sherrilyn Rist, MD;  Location: [facility] ENDOSCOPY;  Service: Gastroenterology;  Laterality: N/A;   CORONARY STENT INTERVENTION N/A 07/12/2021   Procedure: CORONARY STENT INTERVENTION;  Surgeon: Corky Crafts, MD;  Location: [facility] INVASIVE CV LAB;  Service: Cardiovascular;  Laterality: N/A;   CORONARY  ULTRASOUND/IVUS N/A 07/12/2021   Procedure: Intravascular Ultrasound/IVUS;  Surgeon: Corky Crafts, MD;  Location: [facility] INVASIVE CV LAB;  Service: Cardiovascular;  Laterality: N/A;   ESOPHAGOGASTRODUODENOSCOPY (EGD) WITH PROPOFOL N/A 07/24/2020   Procedure: ESOPHAGOGASTRODUODENOSCOPY (EGD) WITH PROPOFOL;  Surgeon: Sherrilyn Rist, MD;  Location: [facility] ENDOSCOPY;  Service: Gastroenterology;  Laterality: N/A;   EYE SURGERY Bilateral    Cataract   FOREIGN BODY REMOVAL Left 05/13/2018   Procedure: FOREIGN BODY REMOVAL left foot;  Surgeon: Cammy Copa, MD;  Location: [facility] OR;  Service: Orthopedics;  Laterality: Left;   I & D EXTREMITY Left 05/21/2018   Procedure: LEFT FOOT DEBRIDEMENT AND WOUND CLOSURE;  Surgeon: Nadara Mustard, MD;  Location: [facility] OR;  Service: Orthopedics;  Laterality: Left;   LEFT HEART CATH AND CORONARY ANGIOGRAPHY N/A 07/12/2021   Procedure: LEFT HEART CATH AND CORONARY ANGIOGRAPHY;  Surgeon: Corky Crafts, MD;  Location: [facility] INVASIVE CV LAB;  Service: Cardiovascular;  Laterality: N/A;   PERIPHERAL VASCULAR BALLOON ANGIOPLASTY  11/30/2023   Procedure: PERIPHERAL VASCULAR BALLOON ANGIOPLASTY;  Surgeon: Maeola Harman, MD;  Location: [facility] INVASIVE CV LAB;  Service: Cardiovascular;;  left AT   PERIPHERAL VASCULAR INTERVENTION  11/30/2023   Procedure: PERIPHERAL VASCULAR INTERVENTION;  Surgeon: Maeola Harman, MD;  Location: [facility] INVASIVE CV LAB;  Service: Cardiovascular;;  stent left AT   POLYPECTOMY  07/24/2020   Procedure: POLYPECTOMY;  Surgeon: Sherrilyn Rist, MD;  Location: [facility] ENDOSCOPY;  Service: Gastroenterology;;   TEE WITHOUT CARDIOVERSION N/A 01/02/2020   Procedure: TRANSESOPHAGEAL ECHOCARDIOGRAM (TEE);  Surgeon: Parke Poisson, MD;  Location: [facility] ENDOSCOPY;  Service: Cardiology;  Laterality: N/A;   TONSILLECTOMY     TUBAL LIGATION     UPPER EXTREMITY ANGIOGRAPHY Left 11/30/2023   Procedure: Upper Extremity Angiography;  Surgeon: Maeola Harman, MD;  Location: [facility] INVASIVE CV LAB;  Service: Cardiovascular;  Laterality: Left;    No Known Allergies  Current Outpatient Medications  Medication Sig Dispense Refill   acetaminophen (TYLENOL 8 HOUR) 650 MG CR tablet Take 1 tablet (650 mg total) by mouth every 6 (six) hours as needed for pain or fever. 30 tablet 0   aspirin 81 MG chewable tablet Chew 1 tablet (81 mg total) by mouth daily. 30 tablet 1   atorvastatin (LIPITOR) 80 MG tablet Take 80 mg by mouth at bedtime.     calcitRIOL (ROCALTROL) 0.5 MCG capsule Take 0.5 mcg by mouth daily.     cinacalcet (SENSIPAR) 30 MG tablet Take 30 mg by mouth daily. With snack     clopidogrel (PLAVIX) 75 MG tablet Take 1 tablet (75 mg total) by mouth daily. 30 tablet 11   gabapentin (NEURONTIN) 100 MG capsule Take 100 mg by mouth 2 (two) times daily.     Hyprom-Naphaz-Polysorb-Zn Sulf (CLEAR EYES COMPLETE OP) Place 1 drop into both eyes daily as needed (dry eyes).  Insulin Glargine Solostar (LANTUS) 100 UNIT/ML Solostar Pen Inject 20 Units into the skin daily.     Insulin Pen Needle 32G X 4 MM MISC Use 4x a day 300 each 3   lidocaine (XYLOCAINE) 5 % ointment Apply 1 Application topically 2 (two) times daily as needed for up to 5 days. 10 g 0   linagliptin (TRADJENTA) 5 MG TABS tablet Take 1 tablet (5 mg total) by mouth daily. 90 tablet 3   magnesium gluconate (MAGONATE) 500 MG tablet Take 500 mg by mouth 2 (two) times a week.     potassium chloride SA (KLOR-CON M) 20 MEQ tablet Take 20 mEq by mouth daily.     sevelamer carbonate (RENVELA) 800 MG tablet Take 800-3,200 mg by mouth See admin instructions. 3200 mg with meals and 800 mg with snacks     triamcinolone lotion (KENALOG) 0.1 % Apply 1 Application topically 2 (two) times daily as needed (irritation).     isosorbide mononitrate (IMDUR) 30 MG 24 hr tablet Take 1 tablet (30 mg total) by mouth daily. (Patient not taking: Reported on 01/01/2024) 30 tablet 1   No current  facility-administered medications for this visit.    Family History  Problem Relation Age of Onset   Hypertension Mother    Diabetes Mother    Hypertension Sister     Social History   Socioeconomic History   Marital status: Divorced    Spouse name: Not on file   Number of children: Not on file   Years of education: Not on file   Highest education level: Not on file  Occupational History   Occupation: Retired  Tobacco Use   Smoking status: Former    Current packs/day: 0.00    Types: Cigarettes    Start date: 01/19/1980    Quit date: 01/19/1984    Years since quitting: 39.9    Passive exposure: Never   Smokeless tobacco: Never  Vaping Use   Vaping status: Never Used  Substance and Sexual Activity   Alcohol use: No   Drug use: No   Sexual activity: Not on file  Other Topics Concern   Not on file  Social History Narrative   Lives in [place] with her dtr.  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  Recently moved from disability to "retired" when she turned 68.  Sedentary.  Knits frequently for fun.   Social Drivers of Corporate investment banker Strain: Not on file  Food Insecurity: No Food Insecurity (06/10/2023)   Hunger Vital Sign    Worried About Running Out of Food in the Last Year: Never true    Ran Out of Food in the Last Year: Never true  Transportation Needs: No Transportation Needs (06/10/2023)   PRAPARE - Administrator, Civil Service (Medical): No    Lack of Transportation (Non-Medical): No  Physical Activity: Unknown (08/06/2019)   Exercise Vital Sign    Days of Exercise per Week: Patient declined    Minutes of Exercise per Session: Patient declined  Stress: No Stress Concern Present (08/06/2019)   [employer] of Occupational Health - Occupational Stress Questionnaire    Feeling of Stress : Only a little  Social Connections: Unknown (12/15/2022)   Received from [facility]   Social Network     Social Network: Not on file  Intimate Partner Violence: Not At Risk (06/10/2023)   Humiliation, Afraid, Rape, and Kick questionnaire    Fear  of Current or Ex-Partner: No    Emotionally Abused: No    Physically Abused: No    Sexually Abused: No     REVIEW OF SYSTEMS:   [X]  denotes positive finding, [ ]  denotes negative finding Cardiac  Comments:  Chest pain or chest pressure:    Shortness of breath upon exertion:    Short of breath when lying flat:    Irregular heart rhythm:        Vascular    Pain in calf, thigh, or hip brought on by ambulation:    Pain in feet at night that wakes you up from your sleep:     Blood clot in your veins:    Leg swelling:         Pulmonary    Oxygen at home:    Productive cough:     Wheezing:         Neurologic    Sudden weakness in arms or legs:     Sudden numbness in arms or legs:     Sudden onset of difficulty speaking or slurred speech:    Temporary loss of vision in one eye:     Problems with dizziness:         Gastrointestinal    Blood in stool:     Vomited blood:         Genitourinary    Burning when urinating:     Blood in urine:        Psychiatric    Major depression:         Hematologic    Bleeding problems:    Problems with blood clotting too easily:        Skin    Rashes or ulcers: x       Constitutional    Fever or chills:      PHYSICAL EXAMINATION:  Today's Vitals   01/06/24 1021  BP: (!) 144/88  Pulse: 87  Resp: 18  Temp: 98 F (36.7 C)  TempSrc: Temporal  SpO2: 97%  Weight: 176 lb 8 oz (80.1 kg)  Height: 5\' 4"  (1.626 m)   Body mass index is 30.3 kg/m.   General:  WDWN in NAD; vital signs documented above Gait: Not observed HENT: WNL, normocephalic Pulmonary: normal non-labored breathing , without wheezing Cardiac: regular HR, without carotid bruits Abdomen: soft, NT; aortic pulse is not palpable Skin: without rashes Vascular Exam/Pulses: Left radial pulse is not palpable Brisk right DP &  peroneal doppler flow Brisk left AT and peroneal doppler flow Extremities:  + thrill in left arm fistula       Musculoskeletal: no muscle wasting or atrophy  Neurologic: A&O X 3 Psychiatric:  The pt has Normal affect.   Non-Invasive Vascular Imaging:   ABI's/TBI's on 01/06/2024: Right:  [place]/0.70 - Great toe pressure: 94 Left:  [place]/0.31 - Great toe pressure: 41  Arterial duplex on 01/06/2024: +----------+--------+-----+--------+--------+--------+  LEFT     PSV cm/sRatioStenosisWaveformComments  +----------+--------+-----+--------+--------+--------+  CFA Distal93                   biphasic          +----------+--------+-----+--------+--------+--------+  SFA Prox  59                   biphasic          +----------+--------+-----+--------+--------+--------+  SFA Mid   83                   biphasic          +----------+--------+-----+--------+--------+--------+  SFA Distal74                   biphasic          +----------+--------+-----+--------+--------+--------+  POP Prox  71                   biphasic          +----------+--------+-----+--------+--------+--------+  POP Distal83                   biphasic          +----------+--------+-----+--------+--------+--------+  ATA Prox  64                   biphasicstent     +----------+--------+-----+--------+--------+--------+  ATA Mid   49                   biphasic          +----------+--------+-----+--------+--------+--------+  ATA Distal58                   biphasic          +----------+--------+-----+--------+--------+--------+  PTA Mid   79                   biphasic          +----------+--------+-----+--------+--------+--------+  PTA Distal0                                      +----------+--------+-----+--------+--------+--------+   Summary:  Left: Patent left lower extremity arterial system with circumferential  calcification observed  throughout.  Patent anterior tibial artery stent      ASSESSMENT/PLAN:: 73 y.o. female here for follow up for PAD with hx of angiogram with stent of left ATA on 11/30/2023 by Dr. Randie Heinz for 2nd toe ulceration.  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 The fistula did not appear healthy but did not appear to have significant steal from the left hand.  ESRD -left 5th finger not improving.  Will set up for ligation of fistula with Dr. Randie Heinz.   -I have also put in for referral to hand surgery  PAD -duplex reveals that left ATA stent is patent without stenosis.  -she continues to have discoloration of the left 2nd toe.  Will continue to monitor and will have her return in 3 months for repeat LLE arterial duplex and ABI.   -she will call sooner if any issues before then --continue asa/statin.  I re-sent Plavix rx to [facility] at [facility], [facility] [phone]  Clinic MD:   Randie Heinz

## 2024-01-06 NOTE — H&P (View-Only) (Signed)
 HISTORY AND PHYSICAL     CC:  follow up. Requesting Provider:  Raymon Mutton., FNP  HPI: This is a 73 y.o. female who is here today for follow up for PAD.  Pt has hx of angiogram with stent of left ATA on 11/30/2023 by Dr. Randie Heinz for 2nd toe ulceration.  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 The fistula did not appear healthy but did not appear to have significant steal from the left hand.  She has hx of end-stage renal disease currently dialyzing 3 times a day with peritoneal dialysis catheter that was placed last July. 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   The pt returns today for follow up.  She states she is doing well.  She denies any claudication or rest pain.  She states that her left 2nd toe is about the same.  She states that she continues to have ulceration of the left 5th finger.  She denies pain in the left hand. Unsure if she has been taking Plavix.    The pt is on a statin for cholesterol management.    The pt is on an aspirin.    Other AC:  plavix The pt is not on medication for hypertension.  The pt is  on medication for diabetes. Tobacco hx:  former   Past Medical History:  Diagnosis Date   Anemia    CHF (congestive heart failure) (HCC)    Chronic kidney disease    Constipation    Coronary artery disease    Diabetes mellitus    Type II   Dyspnea    with exertion   ESRD (end stage renal disease) (HCC)    History of blood transfusion    Hyperlipemia    Hypertension    Ischemic cardiomyopathy    Myocardial infarction [facility])    Peripheral vascular disease (HCC)    Pneumonia 2019    Past Surgical History:  Procedure Laterality Date   ABDOMINAL AORTOGRAM W/LOWER EXTREMITY N/A 10/15/2022   Procedure:  ABDOMINAL AORTOGRAM W/LOWER EXTREMITY;  Surgeon: Victorino Sparrow, MD;  Location: [facility] INVASIVE CV LAB;  Service: Cardiovascular;  Laterality: N/A;   ABDOMINAL AORTOGRAM W/LOWER EXTREMITY N/A 11/30/2023   Procedure: ABDOMINAL AORTOGRAM W/LOWER EXTREMITY;  Surgeon: Maeola Harman, MD;  Location: [facility] INVASIVE CV LAB;  Service: Cardiovascular;  Laterality: N/A;   APPENDECTOMY     AV FISTULA PLACEMENT Left 10/05/2019   Procedure: INSERTION OF ARTERIOVENOUS (AV) GORE-TEX VASCULAR GRAFT LEFT ARM;  Surgeon: Larina Earthly, MD;  Location: [facility] OR;  Service: Vascular;  Laterality: Left;   AV FISTULA PLACEMENT Left 02/27/2021   Procedure: LEFT ARM FIRST STAGE BASILIC VEIN  ARTERIOVENOUS (AV) FISTULA CREATION;  Surgeon: Maeola Harman, MD;  Location: [facility] OR;  Service: Vascular;  Laterality: Left;   BASCILIC VEIN TRANSPOSITION Left 04/17/2021   Procedure: LEFT SECOND STAGE BASCILIC VEIN TRANSPOSITION;  Surgeon: Maeola Harman, MD;  Location: [facility] OR;  Service: Vascular;  Laterality: Left;   BIOPSY  07/24/2020   Procedure: BIOPSY;  Surgeon: Sherrilyn Rist, MD;  Location: [facility] ENDOSCOPY;  Service: Gastroenterology;;   BUBBLE STUDY  01/02/2020   Procedure: BUBBLE STUDY;  Surgeon: Parke Poisson, MD;  Location: [facility] ENDOSCOPY;  Service: Cardiology;;  CAPD INSERTION N/A 05/01/2023   Procedure: LAPAROSCOPIC INSERTION CONTINUOUS AMBULATORY PERITONEAL DIALYSIS  (CAPD) CATHETER;  Surgeon: Maeola Harman, MD;  Location: [facility] OR;  Service: Vascular;  Laterality: N/A;   CARDIAC CATHETERIZATION     COLONOSCOPY WITH PROPOFOL N/A 07/24/2020   Procedure: COLONOSCOPY WITH PROPOFOL;  Surgeon: Sherrilyn Rist, MD;  Location: [facility] ENDOSCOPY;  Service: Gastroenterology;  Laterality: N/A;   CORONARY STENT INTERVENTION N/A 07/12/2021   Procedure: CORONARY STENT INTERVENTION;  Surgeon: Corky Crafts, MD;  Location: [facility] INVASIVE CV LAB;  Service: Cardiovascular;  Laterality: N/A;   CORONARY  ULTRASOUND/IVUS N/A 07/12/2021   Procedure: Intravascular Ultrasound/IVUS;  Surgeon: Corky Crafts, MD;  Location: [facility] INVASIVE CV LAB;  Service: Cardiovascular;  Laterality: N/A;   ESOPHAGOGASTRODUODENOSCOPY (EGD) WITH PROPOFOL N/A 07/24/2020   Procedure: ESOPHAGOGASTRODUODENOSCOPY (EGD) WITH PROPOFOL;  Surgeon: Sherrilyn Rist, MD;  Location: [facility] ENDOSCOPY;  Service: Gastroenterology;  Laterality: N/A;   EYE SURGERY Bilateral    Cataract   FOREIGN BODY REMOVAL Left 05/13/2018   Procedure: FOREIGN BODY REMOVAL left foot;  Surgeon: Cammy Copa, MD;  Location: [facility] OR;  Service: Orthopedics;  Laterality: Left;   I & D EXTREMITY Left 05/21/2018   Procedure: LEFT FOOT DEBRIDEMENT AND WOUND CLOSURE;  Surgeon: Nadara Mustard, MD;  Location: [facility] OR;  Service: Orthopedics;  Laterality: Left;   LEFT HEART CATH AND CORONARY ANGIOGRAPHY N/A 07/12/2021   Procedure: LEFT HEART CATH AND CORONARY ANGIOGRAPHY;  Surgeon: Corky Crafts, MD;  Location: [facility] INVASIVE CV LAB;  Service: Cardiovascular;  Laterality: N/A;   PERIPHERAL VASCULAR BALLOON ANGIOPLASTY  11/30/2023   Procedure: PERIPHERAL VASCULAR BALLOON ANGIOPLASTY;  Surgeon: Maeola Harman, MD;  Location: [facility] INVASIVE CV LAB;  Service: Cardiovascular;;  left AT   PERIPHERAL VASCULAR INTERVENTION  11/30/2023   Procedure: PERIPHERAL VASCULAR INTERVENTION;  Surgeon: Maeola Harman, MD;  Location: [facility] INVASIVE CV LAB;  Service: Cardiovascular;;  stent left AT   POLYPECTOMY  07/24/2020   Procedure: POLYPECTOMY;  Surgeon: Sherrilyn Rist, MD;  Location: [facility] ENDOSCOPY;  Service: Gastroenterology;;   TEE WITHOUT CARDIOVERSION N/A 01/02/2020   Procedure: TRANSESOPHAGEAL ECHOCARDIOGRAM (TEE);  Surgeon: Parke Poisson, MD;  Location: [facility] ENDOSCOPY;  Service: Cardiology;  Laterality: N/A;   TONSILLECTOMY     TUBAL LIGATION     UPPER EXTREMITY ANGIOGRAPHY Left 11/30/2023   Procedure: Upper Extremity Angiography;  Surgeon: Maeola Harman, MD;  Location: [facility] INVASIVE CV LAB;  Service: Cardiovascular;  Laterality: Left;    No Known Allergies  Current Outpatient Medications  Medication Sig Dispense Refill   acetaminophen (TYLENOL 8 HOUR) 650 MG CR tablet Take 1 tablet (650 mg total) by mouth every 6 (six) hours as needed for pain or fever. 30 tablet 0   aspirin 81 MG chewable tablet Chew 1 tablet (81 mg total) by mouth daily. 30 tablet 1   atorvastatin (LIPITOR) 80 MG tablet Take 80 mg by mouth at bedtime.     calcitRIOL (ROCALTROL) 0.5 MCG capsule Take 0.5 mcg by mouth daily.     cinacalcet (SENSIPAR) 30 MG tablet Take 30 mg by mouth daily. With snack     clopidogrel (PLAVIX) 75 MG tablet Take 1 tablet (75 mg total) by mouth daily. 30 tablet 11   gabapentin (NEURONTIN) 100 MG capsule Take 100 mg by mouth 2 (two) times daily.     Hyprom-Naphaz-Polysorb-Zn Sulf (CLEAR EYES COMPLETE OP) Place 1 drop into both eyes daily as needed (dry eyes).  Insulin Glargine Solostar (LANTUS) 100 UNIT/ML Solostar Pen Inject 20 Units into the skin daily.     Insulin Pen Needle 32G X 4 MM MISC Use 4x a day 300 each 3   lidocaine (XYLOCAINE) 5 % ointment Apply 1 Application topically 2 (two) times daily as needed for up to 5 days. 10 g 0   linagliptin (TRADJENTA) 5 MG TABS tablet Take 1 tablet (5 mg total) by mouth daily. 90 tablet 3   magnesium gluconate (MAGONATE) 500 MG tablet Take 500 mg by mouth 2 (two) times a week.     potassium chloride SA (KLOR-CON M) 20 MEQ tablet Take 20 mEq by mouth daily.     sevelamer carbonate (RENVELA) 800 MG tablet Take 800-3,200 mg by mouth See admin instructions. 3200 mg with meals and 800 mg with snacks     triamcinolone lotion (KENALOG) 0.1 % Apply 1 Application topically 2 (two) times daily as needed (irritation).     isosorbide mononitrate (IMDUR) 30 MG 24 hr tablet Take 1 tablet (30 mg total) by mouth daily. (Patient not taking: Reported on 01/01/2024) 30 tablet 1   No current  facility-administered medications for this visit.    Family History  Problem Relation Age of Onset   Hypertension Mother    Diabetes Mother    Hypertension Sister     Social History   Socioeconomic History   Marital status: Divorced    Spouse name: Not on file   Number of children: Not on file   Years of education: Not on file   Highest education level: Not on file  Occupational History   Occupation: Retired  Tobacco Use   Smoking status: Former    Current packs/day: 0.00    Types: Cigarettes    Start date: 01/19/1980    Quit date: 01/19/1984    Years since quitting: 39.9    Passive exposure: Never   Smokeless tobacco: Never  Vaping Use   Vaping status: Never Used  Substance and Sexual Activity   Alcohol use: No   Drug use: No   Sexual activity: Not on file  Other Topics Concern   Not on file  Social History Narrative   Lives in [place] with her dtr.  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  Recently moved from disability to "retired" when she turned 68.  Sedentary.  Knits frequently for fun.   Social Drivers of Corporate investment banker Strain: Not on file  Food Insecurity: No Food Insecurity (06/10/2023)   Hunger Vital Sign    Worried About Running Out of Food in the Last Year: Never true    Ran Out of Food in the Last Year: Never true  Transportation Needs: No Transportation Needs (06/10/2023)   PRAPARE - Administrator, Civil Service (Medical): No    Lack of Transportation (Non-Medical): No  Physical Activity: Unknown (08/06/2019)   Exercise Vital Sign    Days of Exercise per Week: Patient declined    Minutes of Exercise per Session: Patient declined  Stress: No Stress Concern Present (08/06/2019)   [employer] of Occupational Health - Occupational Stress Questionnaire    Feeling of Stress : Only a little  Social Connections: Unknown (12/15/2022)   Received from [facility]   Social Network     Social Network: Not on file  Intimate Partner Violence: Not At Risk (06/10/2023)   Humiliation, Afraid, Rape, and Kick questionnaire    Fear  of Current or Ex-Partner: No    Emotionally Abused: No    Physically Abused: No    Sexually Abused: No     REVIEW OF SYSTEMS:   [X]  denotes positive finding, [ ]  denotes negative finding Cardiac  Comments:  Chest pain or chest pressure:    Shortness of breath upon exertion:    Short of breath when lying flat:    Irregular heart rhythm:        Vascular    Pain in calf, thigh, or hip brought on by ambulation:    Pain in feet at night that wakes you up from your sleep:     Blood clot in your veins:    Leg swelling:         Pulmonary    Oxygen at home:    Productive cough:     Wheezing:         Neurologic    Sudden weakness in arms or legs:     Sudden numbness in arms or legs:     Sudden onset of difficulty speaking or slurred speech:    Temporary loss of vision in one eye:     Problems with dizziness:         Gastrointestinal    Blood in stool:     Vomited blood:         Genitourinary    Burning when urinating:     Blood in urine:        Psychiatric    Major depression:         Hematologic    Bleeding problems:    Problems with blood clotting too easily:        Skin    Rashes or ulcers: x       Constitutional    Fever or chills:      PHYSICAL EXAMINATION:  Today's Vitals   01/06/24 1021  BP: (!) 144/88  Pulse: 87  Resp: 18  Temp: 98 F (36.7 C)  TempSrc: Temporal  SpO2: 97%  Weight: 176 lb 8 oz (80.1 kg)  Height: 5\' 4"  (1.626 m)   Body mass index is 30.3 kg/m.   General:  WDWN in NAD; vital signs documented above Gait: Not observed HENT: WNL, normocephalic Pulmonary: normal non-labored breathing , without wheezing Cardiac: regular HR, without carotid bruits Abdomen: soft, NT; aortic pulse is not palpable Skin: without rashes Vascular Exam/Pulses: Left radial pulse is not palpable Brisk right DP &  peroneal doppler flow Brisk left AT and peroneal doppler flow Extremities:  + thrill in left arm fistula       Musculoskeletal: no muscle wasting or atrophy  Neurologic: A&O X 3 Psychiatric:  The pt has Normal affect.   Non-Invasive Vascular Imaging:   ABI's/TBI's on 01/06/2024: Right:  [place]/0.70 - Great toe pressure: 94 Left:  [place]/0.31 - Great toe pressure: 41  Arterial duplex on 01/06/2024: +----------+--------+-----+--------+--------+--------+  LEFT     PSV cm/sRatioStenosisWaveformComments  +----------+--------+-----+--------+--------+--------+  CFA Distal93                   biphasic          +----------+--------+-----+--------+--------+--------+  SFA Prox  59                   biphasic          +----------+--------+-----+--------+--------+--------+  SFA Mid   83                   biphasic          +----------+--------+-----+--------+--------+--------+  SFA Distal74                   biphasic          +----------+--------+-----+--------+--------+--------+  POP Prox  71                   biphasic          +----------+--------+-----+--------+--------+--------+  POP Distal83                   biphasic          +----------+--------+-----+--------+--------+--------+  ATA Prox  64                   biphasicstent     +----------+--------+-----+--------+--------+--------+  ATA Mid   49                   biphasic          +----------+--------+-----+--------+--------+--------+  ATA Distal58                   biphasic          +----------+--------+-----+--------+--------+--------+  PTA Mid   79                   biphasic          +----------+--------+-----+--------+--------+--------+  PTA Distal0                                      +----------+--------+-----+--------+--------+--------+   Summary:  Left: Patent left lower extremity arterial system with circumferential  calcification observed  throughout.  Patent anterior tibial artery stent      ASSESSMENT/PLAN:: 73 y.o. female here for follow up for PAD with hx of angiogram with stent of left ATA on 11/30/2023 by Dr. Randie Heinz for 2nd toe ulceration.  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 The fistula did not appear healthy but did not appear to have significant steal from the left hand.  ESRD -left 5th finger not improving.  Will set up for ligation of fistula with Dr. Randie Heinz.   -I have also put in for referral to hand surgery  PAD -duplex reveals that left ATA stent is patent without stenosis.  -she continues to have discoloration of the left 2nd toe.  Will continue to monitor and will have her return in 3 months for repeat LLE arterial duplex and ABI.   -she will call sooner if any issues before then --continue asa/statin.  I re-sent Plavix rx to [facility] at [facility], [facility] [phone]  Clinic MD:   Randie Heinz

## 2024-01-07 ENCOUNTER — Ambulatory Visit: Payer: Medicare HMO | Admitting: Neurology

## 2024-01-12 ENCOUNTER — Telehealth: Payer: Self-pay

## 2024-01-12 NOTE — Telephone Encounter (Signed)
 Attempted call to reschedule timing for surgery on 3/21.

## 2024-01-13 ENCOUNTER — Encounter (HOSPITAL_COMMUNITY): Payer: Self-pay | Admitting: Vascular Surgery

## 2024-01-13 ENCOUNTER — Other Ambulatory Visit: Payer: Self-pay

## 2024-01-13 NOTE — Anesthesia Preprocedure Evaluation (Signed)
 Anesthesia Evaluation  Patient identified by MRN, date of birth, ID band Patient awake    Reviewed: Allergy & Precautions, NPO status , Patient's Chart, lab work & pertinent test results  Airway Mallampati: II  TM Distance: >3 FB Neck ROM: Full    Dental  (+) Dental Advisory Given, Edentulous Upper, Edentulous Lower   Pulmonary shortness of breath and with exertion, former smoker   Pulmonary exam normal breath sounds clear to auscultation       Cardiovascular hypertension, Pt. on medications + [name], + Past MI, + Cardiac Stents, + Peripheral Vascular Disease and +CHF   Rhythm:Regular Rate:Normal  Echo 03/2023  1. Left ventricular ejection fraction, by estimation, is 60 to 65%. The left ventricle has normal function. The left ventricle has no regional wall motion abnormalities. Left ventricular diastolic parameters are consistent with Grade II diastolic dysfunction (pseudonormalization). Elevated left atrial pressure.   2. Right ventricular systolic function is normal. The right ventricular size is normal. Tricuspid regurgitation signal is inadequate for assessing PA pressure.   3. The mitral valve is degenerative. Trivial mitral valve regurgitation. Moderate mitral annular calcification.   4. The aortic valve is tricuspid. Aortic valve regurgitation is not visualized. No aortic stenosis is present.   5. The inferior vena cava is normal in size with greater than 50% respiratory variability, suggesting right atrial pressure of 3 mmHg.   Comparison(s): EF 35%, global hypokinesis. Compared to prior, LV systolic function has improved.    Cath 06/2021   Prox LAD to Mid LAD lesion is 80% stenosed.   A drug-eluting stent was successfully placed using a STENT ONYX FRONTIER 3.0X12, postdilated to 3.7 mm and optimized with intravascular ultrasound.   Post intervention, there is a 0% residual stenosis.   Mid LAD lesion is 75% stenosed.   A  drug-eluting stent was successfully placed using a STENT ONYX FRONTIER 2.5X12, postdilated 2.75 mm and optimized with intravascular ultrasound.   Post intervention, there is a 0% residual stenosis.   LV end diastolic pressure is normal.   There is no aortic valve stenosis.   Continue aggressive secondary prevention.  Dual antiplatelet therapy with presumably aspirin and Plavix given issues with anemia.  Brilinta may put her at higher risk of bleeding.    Neuro/Psych  Headaches PSYCHIATRIC DISORDERS  Depression       GI/Hepatic Neg liver ROS, PUD,,,  Endo/Other  diabetes    Renal/GU Renal disease     Musculoskeletal negative musculoskeletal ROS (+)    Abdominal  (+) + obese  Peds  Hematology  (+) Blood dyscrasia, anemia   Anesthesia Other Findings   Reproductive/Obstetrics                              Anesthesia Physical Anesthesia Plan  ASA: 3  Anesthesia Plan:    Post-op Pain Management: Tylenol PO (pre-op)*   Induction:   PONV Risk Score and Plan: 2 and Ondansetron, Dexamethasone and Treatment may vary due to age or medical condition  Airway Management Planned:   Additional Equipment:   Intra-op Plan:   Post-operative Plan:   Informed Consent: I have reviewed the patients History and Physical, chart, labs and discussed the procedure including the risks, benefits and alternatives for the proposed anesthesia with the patient or authorized representative who has indicated his/her understanding and acceptance.     Dental advisory given  Plan Discussed with: CRNA  Anesthesia Plan Comments: (See 3/19 PAT note)  Anesthesia Quick Evaluation

## 2024-01-13 NOTE — Progress Notes (Signed)
 Case: [id number] Date/Time: 01/15/24 0715   Procedure: LIGATION OF ARTERIOVENOUS  FISTULA (Left)   Anesthesia type: Choice   Diagnosis: End stage renal disease (HCC) [N18.6]   Pre-op diagnosis: ESRD   [facility]OM 10 / [facility]   Surgeons: Maeola Harman, MD       DISCUSSION: Melissa Howe is a 73 yo female who is being evaluated as SDW prior to surgery above. PMH of former smoking, hx of NSTEMI and [name] s/p PCI (in 2022), ischemic cardiomyopathy, HFpEF, PAD s/p left ATA stent on 11/30/23 , HTN, HLD, ESRD on peritoneal dialysis, IDDM.  Patient follows with Cardiology for hx of NSTEMI and [name] s/p PCI in 2022. Also has hx of ischemic cardiomyopathy with now recovered EF. Last seen by Dr. Flora Lipps for pre op clearance prior to starting peritoneal dialysis. Echo was updated which showed normal EF, grade II DD, no significant valvular disease. She was cleared for surgery in July. Now she is scheduled for above surgery due to non healing ulcer on her L fingertip.  Patient followed by Nephrology for ESRD. Last seen on 01/11/24. Undergoing daily peritoneal dialysis. Having balance issues. Noted to be mildly volume overloaded.  LD Plavix 3/18  VS: There were no vitals taken for this visit.  PROVIDERS: Raymon Mutton., FNP Cardiology: Lennie Odor, MD Nephrology: Sabra Heck, MD   LABS: obtain DOS   IMAGES: CXR 11/26/23:  FINDINGS: Bilateral lung fields are clear. Bilateral costophrenic angles are clear.   Normal cardio-mediastinal silhouette.   No acute osseous abnormalities.   The soft tissues are within normal limits.   IMPRESSION: No active cardiopulmonary disease.   EKG 11/26/23;  Sinus rhythm, rate 77 Nonspecific intraventricular conduction delay Consider anterior infarct Nonspecific T abnormalities, lateral leads  CV: Echo 04/15/2023:  IMPRESSIONS    1. Left ventricular ejection fraction, by estimation, is 60 to 65%. The left ventricle has normal  function. The left ventricle has no regional wall motion abnormalities. Left ventricular diastolic parameters are consistent with Grade II diastolic dysfunction (pseudonormalization). Elevated left atrial pressure.  2. Right ventricular systolic function is normal. The right ventricular size is normal. Tricuspid regurgitation signal is inadequate for assessing PA pressure.  3. The mitral valve is degenerative. Trivial mitral valve regurgitation. Moderate mitral annular calcification.  4. The aortic valve is tricuspid. Aortic valve regurgitation is not visualized. No aortic stenosis is present.  5. The inferior vena cava is normal in size with greater than 50% respiratory variability, suggesting right atrial pressure of 3 mmHg.  Comparison(s): EF 35%, global hypokinesis. Compared to prior, LV systolic function has improved. Past Medical History:  Diagnosis Date   Anemia    CHF (congestive heart failure) (HCC)    Chronic kidney disease    Constipation    Coronary artery disease    Diabetes mellitus    Type II   Dyspnea    with exertion   ESRD (end stage renal disease) (HCC)    dialyzing 3 times a day with peritoneal dialysis catheter that was placed in 04/2023   History of blood transfusion    Hyperlipemia    Hypertension    Ischemic cardiomyopathy    Myocardial infarction [facility])    Peripheral vascular disease (HCC)    Pneumonia 2019    Past Surgical History:  Procedure Laterality Date   ABDOMINAL AORTOGRAM W/LOWER EXTREMITY N/A 10/15/2022   Procedure: ABDOMINAL AORTOGRAM W/LOWER EXTREMITY;  Surgeon: Victorino Sparrow, MD;  Location: [facility] INVASIVE CV LAB;  Service: Cardiovascular;  Laterality: N/A;   ABDOMINAL AORTOGRAM W/LOWER EXTREMITY N/A 11/30/2023   Procedure: ABDOMINAL AORTOGRAM W/LOWER EXTREMITY;  Surgeon: Maeola Harman, MD;  Location: [facility] INVASIVE CV LAB;  Service: Cardiovascular;  Laterality: N/A;   APPENDECTOMY     AV FISTULA PLACEMENT Left 10/05/2019    Procedure: INSERTION OF ARTERIOVENOUS (AV) GORE-TEX VASCULAR GRAFT LEFT ARM;  Surgeon: Larina Earthly, MD;  Location: [facility] OR;  Service: Vascular;  Laterality: Left;   AV FISTULA PLACEMENT Left 02/27/2021   Procedure: LEFT ARM FIRST STAGE BASILIC VEIN  ARTERIOVENOUS (AV) FISTULA CREATION;  Surgeon: Maeola Harman, MD;  Location: [facility] OR;  Service: Vascular;  Laterality: Left;   BASCILIC VEIN TRANSPOSITION Left 04/17/2021   Procedure: LEFT SECOND STAGE BASCILIC VEIN TRANSPOSITION;  Surgeon: Maeola Harman, MD;  Location: [facility] OR;  Service: Vascular;  Laterality: Left;   BIOPSY  07/24/2020   Procedure: BIOPSY;  Surgeon: Sherrilyn Rist, MD;  Location: [facility] ENDOSCOPY;  Service: Gastroenterology;;   BUBBLE STUDY  01/02/2020   Procedure: BUBBLE STUDY;  Surgeon: Parke Poisson, MD;  Location: [facility] ENDOSCOPY;  Service: Cardiology;;   CAPD INSERTION N/A 05/01/2023   Procedure: LAPAROSCOPIC INSERTION CONTINUOUS AMBULATORY PERITONEAL DIALYSIS  (CAPD) CATHETER;  Surgeon: Maeola Harman, MD;  Location: [facility] OR;  Service: Vascular;  Laterality: N/A;   CARDIAC CATHETERIZATION     COLONOSCOPY WITH PROPOFOL N/A 07/24/2020   Procedure: COLONOSCOPY WITH PROPOFOL;  Surgeon: Sherrilyn Rist, MD;  Location: [facility] ENDOSCOPY;  Service: Gastroenterology;  Laterality: N/A;   CORONARY STENT INTERVENTION N/A 07/12/2021   Procedure: CORONARY STENT INTERVENTION;  Surgeon: Corky Crafts, MD;  Location: [facility] INVASIVE CV LAB;  Service: Cardiovascular;  Laterality: N/A;   CORONARY ULTRASOUND/IVUS N/A 07/12/2021   Procedure: Intravascular Ultrasound/IVUS;  Surgeon: Corky Crafts, MD;  Location: [facility] INVASIVE CV LAB;  Service: Cardiovascular;  Laterality: N/A;   ESOPHAGOGASTRODUODENOSCOPY (EGD) WITH PROPOFOL N/A 07/24/2020   Procedure: ESOPHAGOGASTRODUODENOSCOPY (EGD) WITH PROPOFOL;  Surgeon: Sherrilyn Rist, MD;  Location: [facility] ENDOSCOPY;  Service: Gastroenterology;  Laterality: N/A;   EYE  SURGERY Bilateral    Cataract   FOREIGN BODY REMOVAL Left 05/13/2018   Procedure: FOREIGN BODY REMOVAL left foot;  Surgeon: Cammy Copa, MD;  Location: [facility] OR;  Service: Orthopedics;  Laterality: Left;   I & D EXTREMITY Left 05/21/2018   Procedure: LEFT FOOT DEBRIDEMENT AND WOUND CLOSURE;  Surgeon: Nadara Mustard, MD;  Location: [facility] OR;  Service: Orthopedics;  Laterality: Left;   LEFT HEART CATH AND CORONARY ANGIOGRAPHY N/A 07/12/2021   Procedure: LEFT HEART CATH AND CORONARY ANGIOGRAPHY;  Surgeon: Corky Crafts, MD;  Location: [facility] INVASIVE CV LAB;  Service: Cardiovascular;  Laterality: N/A;   PERIPHERAL VASCULAR BALLOON ANGIOPLASTY  11/30/2023   Procedure: PERIPHERAL VASCULAR BALLOON ANGIOPLASTY;  Surgeon: Maeola Harman, MD;  Location: [facility] INVASIVE CV LAB;  Service: Cardiovascular;;  left AT   PERIPHERAL VASCULAR INTERVENTION  11/30/2023   Procedure: PERIPHERAL VASCULAR INTERVENTION;  Surgeon: Maeola Harman, MD;  Location: [facility] INVASIVE CV LAB;  Service: Cardiovascular;;  stent left AT   POLYPECTOMY  07/24/2020   Procedure: POLYPECTOMY;  Surgeon: Sherrilyn Rist, MD;  Location: [facility] ENDOSCOPY;  Service: Gastroenterology;;   TEE WITHOUT CARDIOVERSION N/A 01/02/2020   Procedure: TRANSESOPHAGEAL ECHOCARDIOGRAM (TEE);  Surgeon: Parke Poisson, MD;  Location: [facility] ENDOSCOPY;  Service: Cardiology;  Laterality: N/A;   TONSILLECTOMY     TUBAL LIGATION     UPPER EXTREMITY  ANGIOGRAPHY Left 11/30/2023   Procedure: Upper Extremity Angiography;  Surgeon: Maeola Harman, MD;  Location: [facility] INVASIVE CV LAB;  Service: Cardiovascular;  Laterality: Left;    MEDICATIONS: No current facility-administered medications for this encounter.    clopidogrel (PLAVIX) 75 MG tablet   acetaminophen (TYLENOL 8 HOUR) 650 MG CR tablet   aspirin 81 MG chewable tablet   atorvastatin (LIPITOR) 80 MG tablet   calcitRIOL (ROCALTROL) 0.5 MCG capsule   cinacalcet (SENSIPAR) 30 MG tablet    gabapentin (NEURONTIN) 100 MG capsule   Hyprom-Naphaz-Polysorb-Zn Sulf (CLEAR EYES COMPLETE OP)   Insulin Glargine Solostar (LANTUS) 100 UNIT/ML Solostar Pen   Insulin Pen Needle 32G X 4 MM MISC   isosorbide mononitrate (IMDUR) 30 MG 24 hr tablet   linagliptin (TRADJENTA) 5 MG TABS tablet   magnesium gluconate (MAGONATE) 500 MG tablet   potassium chloride SA (KLOR-CON M) 20 MEQ tablet   sevelamer carbonate (RENVELA) 800 MG tablet   triamcinolone lotion (KENALOG) 0.1 %   Ubaldo Glassing, PA-C [facility] Surgical Short Stay/Anesthesiology [facility] Phone [phone] 01/13/2024 12:33 PM

## 2024-01-13 NOTE — Progress Notes (Signed)
 PCP - Rozetta Nunnery., FNP Cardiologist - Dr Gerri Spore O'Neal  Chest x-ray - 11/26/23 EKG - 11/26/23 Stress Test - n/a ECHO - 04/15/23 Cardiac Cath - 07/12/21  ICD Pacemaker/Loop - n/a  Sleep Study -  n/a  Diabetes Type 2  Do not take Trajenta on the morning of surgery.    THE MORNING OF SURGERY, take 10 units of Lantus Insulin.  If your blood sugar is less than 70 mg/dL, you will need to treat for low blood sugar: Treat a low blood sugar (less than 70 mg/dL) with  cup of clear juice (cranberry or apple), 4 glucose tablets, OR glucose gel. Recheck blood sugar in 15 minutes after treatment (to make sure it is greater than 70 mg/dL). If your blood sugar is not greater than 70 mg/dL on recheck, call [phone] for further instructions.  Blood Thinner Instructions:  Last dose of Plavix was on 01/12/24.  Aspirin Instructions: Continue  NPO   Anesthesia review: Yes  STOP now taking any Aspirin (unless otherwise instructed by your surgeon), Aleve, Naproxen, Ibuprofen, Motrin, Advil, Goody's, BC's, all herbal medications, fish oil, and all vitamins.   Coronavirus Screening Do you have any of the following symptoms:  Cough yes/no: No Fever (>100.22F)  yes/no: No Runny nose yes/no: No Sore throat yes/no: No Difficulty breathing/shortness of breath  yes/no: No  Have you traveled in the last 14 days and where? yes/no: No  Patient verbalized understanding of instructions that were given via phone.

## 2024-01-14 ENCOUNTER — Other Ambulatory Visit: Payer: Self-pay | Admitting: Nurse Practitioner

## 2024-01-14 DIAGNOSIS — Z1231 Encounter for screening mammogram for malignant neoplasm of breast: Secondary | ICD-10-CM

## 2024-01-15 ENCOUNTER — Ambulatory Visit (HOSPITAL_BASED_OUTPATIENT_CLINIC_OR_DEPARTMENT_OTHER): Payer: Self-pay | Admitting: Medical

## 2024-01-15 ENCOUNTER — Other Ambulatory Visit: Payer: Self-pay

## 2024-01-15 ENCOUNTER — Encounter (HOSPITAL_COMMUNITY): Payer: Self-pay | Admitting: Vascular Surgery

## 2024-01-15 ENCOUNTER — Ambulatory Visit (HOSPITAL_COMMUNITY): Payer: Self-pay | Admitting: Medical

## 2024-01-15 ENCOUNTER — Encounter (HOSPITAL_COMMUNITY)
Admission: RE | Disposition: A | Discharge status: Home / Self Care | Payer: Self-pay | Source: Home / Self Care | Attending: Vascular Surgery

## 2024-01-15 ENCOUNTER — Other Ambulatory Visit (HOSPITAL_COMMUNITY): Payer: Self-pay

## 2024-01-15 ENCOUNTER — Encounter (HOSPITAL_COMMUNITY): Payer: Self-pay

## 2024-01-15 ENCOUNTER — Ambulatory Visit (HOSPITAL_COMMUNITY)
Admission: RE | Admit: 2024-01-15 | Discharge: 2024-01-15 | Disposition: A | Discharge status: Home / Self Care | Attending: Vascular Surgery | Admitting: Vascular Surgery

## 2024-01-15 DIAGNOSIS — I2510 Atherosclerotic heart disease of native coronary artery without angina pectoris: Secondary | ICD-10-CM | POA: Diagnosis not present

## 2024-01-15 DIAGNOSIS — E11622 Type 2 diabetes mellitus with other skin ulcer: Secondary | ICD-10-CM | POA: Insufficient documentation

## 2024-01-15 DIAGNOSIS — N186 End stage renal disease: Secondary | ICD-10-CM | POA: Insufficient documentation

## 2024-01-15 DIAGNOSIS — I509 Heart failure, unspecified: Secondary | ICD-10-CM | POA: Diagnosis not present

## 2024-01-15 DIAGNOSIS — I132 Hypertensive heart and chronic kidney disease with heart failure and with stage 5 chronic kidney disease, or end stage renal disease: Secondary | ICD-10-CM | POA: Insufficient documentation

## 2024-01-15 DIAGNOSIS — E1151 Type 2 diabetes mellitus with diabetic peripheral angiopathy without gangrene: Secondary | ICD-10-CM | POA: Insufficient documentation

## 2024-01-15 DIAGNOSIS — Z87891 Personal history of nicotine dependence: Secondary | ICD-10-CM | POA: Insufficient documentation

## 2024-01-15 DIAGNOSIS — Z955 Presence of coronary angioplasty implant and graft: Secondary | ICD-10-CM | POA: Insufficient documentation

## 2024-01-15 DIAGNOSIS — E1122 Type 2 diabetes mellitus with diabetic chronic kidney disease: Secondary | ICD-10-CM | POA: Insufficient documentation

## 2024-01-15 DIAGNOSIS — Z8249 Family history of ischemic heart disease and other diseases of the circulatory system: Secondary | ICD-10-CM | POA: Insufficient documentation

## 2024-01-15 DIAGNOSIS — Z79899 Other long term (current) drug therapy: Secondary | ICD-10-CM | POA: Diagnosis not present

## 2024-01-15 DIAGNOSIS — Z794 Long term (current) use of insulin: Secondary | ICD-10-CM | POA: Insufficient documentation

## 2024-01-15 DIAGNOSIS — N185 Chronic kidney disease, stage 5: Secondary | ICD-10-CM

## 2024-01-15 DIAGNOSIS — Z7982 Long term (current) use of aspirin: Secondary | ICD-10-CM | POA: Diagnosis not present

## 2024-01-15 DIAGNOSIS — Z7984 Long term (current) use of oral hypoglycemic drugs: Secondary | ICD-10-CM | POA: Insufficient documentation

## 2024-01-15 DIAGNOSIS — Z8711 Personal history of peptic ulcer disease: Secondary | ICD-10-CM | POA: Diagnosis not present

## 2024-01-15 DIAGNOSIS — I5030 Unspecified diastolic (congestive) heart failure: Secondary | ICD-10-CM | POA: Insufficient documentation

## 2024-01-15 DIAGNOSIS — Z992 Dependence on renal dialysis: Secondary | ICD-10-CM | POA: Diagnosis not present

## 2024-01-15 DIAGNOSIS — T82898A Other specified complication of vascular prosthetic devices, implants and grafts, initial encounter: Secondary | ICD-10-CM

## 2024-01-15 DIAGNOSIS — I252 Old myocardial infarction: Secondary | ICD-10-CM | POA: Diagnosis not present

## 2024-01-15 DIAGNOSIS — Z7902 Long term (current) use of antithrombotics/antiplatelets: Secondary | ICD-10-CM | POA: Diagnosis not present

## 2024-01-15 DIAGNOSIS — Z833 Family history of diabetes mellitus: Secondary | ICD-10-CM | POA: Insufficient documentation

## 2024-01-15 DIAGNOSIS — L98499 Non-pressure chronic ulcer of skin of other sites with unspecified severity: Secondary | ICD-10-CM | POA: Insufficient documentation

## 2024-01-15 DIAGNOSIS — E785 Hyperlipidemia, unspecified: Secondary | ICD-10-CM | POA: Insufficient documentation

## 2024-01-15 HISTORY — PX: LIGATION OF ARTERIOVENOUS  FISTULA: SHX5948

## 2024-01-15 LAB — GLUCOSE, CAPILLARY
Glucose-Capillary: 123 mg/dL — ABNORMAL HIGH (ref 70–99)
Glucose-Capillary: 126 mg/dL — ABNORMAL HIGH (ref 70–99)

## 2024-01-15 LAB — POCT I-STAT, CHEM 8
BUN: 28 mg/dL — ABNORMAL HIGH (ref 8–23)
Calcium, Ion: 1.17 mmol/L (ref 1.15–1.40)
Chloride: 97 mmol/L — ABNORMAL LOW (ref 98–111)
Creatinine, Ser: 8.3 mg/dL — ABNORMAL HIGH (ref 0.44–1.00)
Glucose, Bld: 111 mg/dL — ABNORMAL HIGH (ref 70–99)
HCT: 46 % (ref 36.0–46.0)
Hemoglobin: 15.6 g/dL — ABNORMAL HIGH (ref 12.0–15.0)
Potassium: 3.4 mmol/L — ABNORMAL LOW (ref 3.5–5.1)
Sodium: 132 mmol/L — ABNORMAL LOW (ref 135–145)
TCO2: 28 mmol/L (ref 22–32)

## 2024-01-15 SURGERY — LIGATION OF ARTERIOVENOUS  FISTULA
Anesthesia: Choice | Laterality: Left

## 2024-01-15 MED ORDER — ACETAMINOPHEN 500 MG PO TABS
1000.0000 mg | ORAL_TABLET | Freq: Once | ORAL | Status: AC
  Administered 2024-01-15: 1000 mg via ORAL
  Filled 2024-01-15: qty 2

## 2024-01-15 MED ORDER — PHENYLEPHRINE 80 MCG/ML (10ML) SYRINGE FOR IV PUSH (FOR BLOOD PRESSURE SUPPORT)
PREFILLED_SYRINGE | INTRAVENOUS | Status: AC
  Filled 2024-01-15: qty 10

## 2024-01-15 MED ORDER — LIDOCAINE 2% (20 MG/ML) 5 ML SYRINGE
INTRAMUSCULAR | Status: AC
  Filled 2024-01-15: qty 5

## 2024-01-15 MED ORDER — SODIUM CHLORIDE 0.9 % IV SOLN: INTRAVENOUS | Status: DC

## 2024-01-15 MED ORDER — 0.9 % SODIUM CHLORIDE (POUR BTL) OPTIME
TOPICAL | Status: DC | PRN
  Administered 2024-01-15: 1000 mL

## 2024-01-15 MED ORDER — SODIUM CHLORIDE 0.9% FLUSH: 3.0000 mL | Freq: Two times a day (BID) | INTRAVENOUS | Status: DC

## 2024-01-15 MED ORDER — PROPOFOL 10 MG/ML IV BOLUS
INTRAVENOUS | Status: DC | PRN
Start: 2024-01-15 — End: 2024-01-15
  Administered 2024-01-15: 50 mg via INTRAVENOUS
  Administered 2024-01-15: 20 mg via INTRAVENOUS
  Administered 2024-01-15: 50 mg via INTRAVENOUS

## 2024-01-15 MED ORDER — PROPOFOL 10 MG/ML IV BOLUS
INTRAVENOUS | Status: AC
  Filled 2024-01-15: qty 20

## 2024-01-15 MED ORDER — DROPERIDOL 2.5 MG/ML IJ SOLN: 0.6250 mg | Freq: Once | INTRAMUSCULAR | Status: DC | PRN

## 2024-01-15 MED ORDER — ONDANSETRON HCL 4 MG/2ML IJ SOLN
INTRAMUSCULAR | Status: DC | PRN
  Administered 2024-01-15: 4 mg via INTRAVENOUS

## 2024-01-15 MED ORDER — ORAL CARE MOUTH RINSE: 15.0000 mL | Freq: Once | OROMUCOSAL | Status: AC

## 2024-01-15 MED ORDER — MIDAZOLAM HCL 2 MG/2ML IJ SOLN
INTRAMUSCULAR | Status: AC
  Filled 2024-01-15: qty 2

## 2024-01-15 MED ORDER — PHENYLEPHRINE 80 MCG/ML (10ML) SYRINGE FOR IV PUSH (FOR BLOOD PRESSURE SUPPORT)
PREFILLED_SYRINGE | INTRAVENOUS | Status: DC | PRN
  Administered 2024-01-15: 160 ug via INTRAVENOUS
  Administered 2024-01-15: 80 ug via INTRAVENOUS
  Administered 2024-01-15: 160 ug via INTRAVENOUS
  Administered 2024-01-15 (×2): 80 ug via INTRAVENOUS
  Administered 2024-01-15: 160 ug via INTRAVENOUS
  Administered 2024-01-15: 80 ug via INTRAVENOUS

## 2024-01-15 MED ORDER — MIDAZOLAM HCL 2 MG/2ML IJ SOLN
INTRAMUSCULAR | Status: DC | PRN
  Administered 2024-01-15: 1 mg via INTRAVENOUS

## 2024-01-15 MED ORDER — CHLORHEXIDINE GLUCONATE 4 % EX SOLN: 60.0000 mL | Freq: Once | CUTANEOUS | Status: DC

## 2024-01-15 MED ORDER — HEPARIN 6000 UNIT IRRIGATION SOLUTION
Status: AC
  Filled 2024-01-15: qty 500

## 2024-01-15 MED ORDER — DEXAMETHASONE SODIUM PHOSPHATE 10 MG/ML IJ SOLN
INTRAMUSCULAR | Status: DC | PRN
  Administered 2024-01-15: 5 mg via INTRAVENOUS

## 2024-01-15 MED ORDER — OXYCODONE-ACETAMINOPHEN 5-325 MG PO TABS
1.0000 | ORAL_TABLET | Freq: Four times a day (QID) | ORAL | 0 refills | Status: DC | PRN
  Filled 2024-01-15: qty 12, 3d supply, fill #0

## 2024-01-15 MED ORDER — FENTANYL CITRATE (PF) 250 MCG/5ML IJ SOLN
INTRAMUSCULAR | Status: AC
Start: 2024-01-15 — End: ?
  Filled 2024-01-15: qty 5

## 2024-01-15 MED ORDER — INSULIN ASPART 100 UNIT/ML IJ SOLN: 0.0000 [IU] | INTRAMUSCULAR | Status: DC | PRN

## 2024-01-15 MED ORDER — LIDOCAINE 2% (20 MG/ML) 5 ML SYRINGE
INTRAMUSCULAR | Status: DC | PRN
  Administered 2024-01-15: 80 mg via INTRAVENOUS

## 2024-01-15 MED ORDER — OXYCODONE HCL 5 MG/5ML PO SOLN: 5.0000 mg | Freq: Once | ORAL | Status: DC | PRN

## 2024-01-15 MED ORDER — CEFAZOLIN SODIUM-DEXTROSE 2-4 GM/100ML-% IV SOLN
2.0000 g | INTRAVENOUS | Status: AC
  Administered 2024-01-15: 2 g via INTRAVENOUS
  Filled 2024-01-15: qty 100

## 2024-01-15 MED ORDER — HEPARIN 6000 UNIT IRRIGATION SOLUTION
Status: DC | PRN
  Administered 2024-01-15: 1

## 2024-01-15 MED ORDER — ONDANSETRON HCL 4 MG/2ML IJ SOLN
INTRAMUSCULAR | Status: AC
  Filled 2024-01-15: qty 2

## 2024-01-15 MED ORDER — EPHEDRINE 5 MG/ML INJ
INTRAVENOUS | Status: AC
  Filled 2024-01-15: qty 5

## 2024-01-15 MED ORDER — OXYCODONE HCL 5 MG PO TABS: 5.0000 mg | ORAL_TABLET | Freq: Once | ORAL | Status: DC | PRN

## 2024-01-15 MED ORDER — EPHEDRINE SULFATE-NACL 50-0.9 MG/10ML-% IV SOSY
PREFILLED_SYRINGE | INTRAVENOUS | Status: DC | PRN
  Administered 2024-01-15: 10 mg via INTRAVENOUS
  Administered 2024-01-15: 5 mg via INTRAVENOUS
  Administered 2024-01-15: 10 mg via INTRAVENOUS

## 2024-01-15 MED ORDER — LIDOCAINE-EPINEPHRINE (PF) 1 %-1:200000 IJ SOLN
INTRAMUSCULAR | Status: AC
Start: 2024-01-15 — End: ?
  Filled 2024-01-15: qty 30

## 2024-01-15 MED ORDER — CHLORHEXIDINE GLUCONATE 0.12 % MT SOLN
15.0000 mL | Freq: Once | OROMUCOSAL | Status: AC
  Administered 2024-01-15: 15 mL via OROMUCOSAL
  Filled 2024-01-15: qty 15

## 2024-01-15 MED ORDER — PHENYLEPHRINE HCL-NACL 20-0.9 MG/250ML-% IV SOLN
INTRAVENOUS | Status: DC | PRN
  Administered 2024-01-15: 30 ug/min via INTRAVENOUS

## 2024-01-15 MED ORDER — FENTANYL CITRATE (PF) 100 MCG/2ML IJ SOLN: 25.0000 ug | INTRAMUSCULAR | Status: DC | PRN

## 2024-01-15 MED ORDER — FENTANYL CITRATE (PF) 250 MCG/5ML IJ SOLN
INTRAMUSCULAR | Status: DC | PRN
  Administered 2024-01-15 (×2): 25 ug via INTRAVENOUS

## 2024-01-15 MED ORDER — CHLORHEXIDINE GLUCONATE 4 % EX SOLN
60.0000 mL | Freq: Once | CUTANEOUS | Status: DC
Start: 2024-01-15 — End: 2024-01-15

## 2024-01-15 SURGICAL SUPPLY — 24 items
BAG COUNTER SPONGE SURGICOUNT (BAG) ×1 IMPLANT
CANISTER SUCT 3000ML PPV (MISCELLANEOUS) ×1 IMPLANT
CLIP LIGATING EXTRA MED SLVR (CLIP) ×1 IMPLANT
CLIP LIGATING EXTRA SM BLUE (MISCELLANEOUS) ×1 IMPLANT
DERMABOND ADVANCED .7 DNX12 (GAUZE/BANDAGES/DRESSINGS) ×1 IMPLANT
ELECT REM PT RETURN 9FT ADLT (ELECTROSURGICAL) ×1 IMPLANT
ELECTRODE REM PT RTRN 9FT ADLT (ELECTROSURGICAL) ×1 IMPLANT
GLOVE BIO SURGEON STRL SZ7.5 (GLOVE) ×1 IMPLANT
GOWN STRL REUS W/ TWL LRG LVL3 (GOWN DISPOSABLE) ×2 IMPLANT
GOWN STRL REUS W/ TWL XL LVL3 (GOWN DISPOSABLE) ×1 IMPLANT
KIT BASIN OR (CUSTOM PROCEDURE TRAY) ×1 IMPLANT
KIT TURNOVER KIT B (KITS) ×1 IMPLANT
NS IRRIG 1000ML POUR BTL (IV SOLUTION) ×1 IMPLANT
PACK CV ACCESS (CUSTOM PROCEDURE TRAY) ×1 IMPLANT
PAD ARMBOARD POSITIONER FOAM (MISCELLANEOUS) ×2 IMPLANT
POWDER SURGICEL 3.0 GRAM (HEMOSTASIS) IMPLANT
SUT ETHILON 3 0 PS 1 (SUTURE) IMPLANT
SUT MNCRL AB 4-0 PS2 18 (SUTURE) ×1 IMPLANT
SUT PROLENE 6 0 BV (SUTURE) IMPLANT
SUT SILK 0 TIES 10X30 (SUTURE) ×1 IMPLANT
SUT VIC AB 3-0 SH 27X BRD (SUTURE) ×1 IMPLANT
TOWEL GREEN STERILE (TOWEL DISPOSABLE) ×1 IMPLANT
UNDERPAD 30X36 HEAVY ABSORB (UNDERPADS AND DIAPERS) ×1 IMPLANT
WATER STERILE IRR 1000ML POUR (IV SOLUTION) ×1 IMPLANT

## 2024-01-15 NOTE — Anesthesia Postprocedure Evaluation (Signed)
 Anesthesia Post Note  Patient: Melissa Howe  Procedure(s) Performed: LIGATION OF ARTERIOVENOUS  FISTULA (Left)     Patient location during evaluation: PACU Anesthesia Type: General Level of consciousness: sedated and patient cooperative Pain management: pain level controlled Vital Signs Assessment: post-procedure vital signs reviewed and stable Respiratory status: spontaneous breathing Cardiovascular status: stable Anesthetic complications: no   No notable events documented.  Last Vitals:  Vitals:   01/15/24 0915 01/15/24 0920  BP:    Pulse:  81  Resp:  12  Temp:  (!) 36.4 C  SpO2: 100% 100%    Last Pain:  Vitals:   01/15/24 0915  TempSrc:   PainSc: 0-No pain                 Lewie Loron

## 2024-01-15 NOTE — Discharge Instructions (Signed)
 Keep incision clean and dry for 24 hours then you may wash your arm

## 2024-01-15 NOTE — Anesthesia Procedure Notes (Signed)
 Procedure Name: LMA Insertion Date/Time: 01/15/2024 7:36 AM  Performed by: Colbert Coyer, CRNAPre-anesthesia Checklist: Patient identified, Emergency Drugs available, Suction available and Patient being monitored Patient Re-evaluated:Patient Re-evaluated prior to induction Oxygen Delivery Method: Circle System Utilized Preoxygenation: Pre-oxygenation with 100% oxygen Induction Type: IV induction Ventilation: Mask ventilation without difficulty LMA: LMA inserted LMA Size: 4.0 Number of attempts: 1 Placement Confirmation: positive ETCO2 Tube secured with: Tape Dental Injury: Teeth and Oropharynx as per pre-operative assessment

## 2024-01-15 NOTE — Transfer of Care (Signed)
 Immediate Anesthesia Transfer of Care Note  Patient: Melissa Howe  Procedure(s) Performed: LIGATION OF ARTERIOVENOUS  FISTULA (Left)  Patient Location: PACU  Anesthesia Type:General  Level of Consciousness: drowsy and patient cooperative  Airway & Oxygen Therapy: Patient Spontanous Breathing and Patient connected to nasal cannula oxygen  Post-op Assessment: Report given to RN and Post -op Vital signs reviewed and stable  Post vital signs: Reviewed and stable  Last Vitals:  Vitals Value Taken Time  BP 132/73 01/15/24 0832  Temp 97.7   Pulse 81 01/15/24 0833  Resp 15 01/15/24 0834  SpO2 100 % 01/15/24 0833  Vitals shown include unfiled device data.  Last Pain:  Vitals:   01/15/24 0621  TempSrc:   PainSc: 0-No pain         Complications: No notable events documented.

## 2024-01-15 NOTE — Op Note (Signed)
    Patient name: Melissa Howe MRN: [id number] DOB: 10-24-1951 Sex: female  01/15/2024 Pre-operative Diagnosis: ESRD, left upper extremity fifth digit ulceration Post-operative diagnosis:  Same Surgeon:  Luanna Salk. Randie Heinz, MD Assistant: Clinton Gallant, PA Procedure Performed:  Ligation of left upper arm av fistula  Indications: 73 year old female on dialysis via peritoneal dialysis.  She has a fistula in the left upper extremity and has ulceration of her left small finger.  We have made referral for hand surgery and she is not gated for fistula ligation.  In experience assistant was necessary to facilitate reexposure of the fistula as well as ligation including assisting with Prolene ligation.  Findings:  Palpable radial artery at completion with strong radial and ulnar artery signals   Procedure:  The patient was identified in the holding area and taken to the operating room where LMA anesthesia was induced.  She was gently prepped and draped in the left upper extremity in the usual fashion, antibiotics were administered timeout was called.  Ultrasound was used to identify the fistula.  The existing incision was then open we dissected down to the fistula and encircled this with vessel loop.  I clamped the fistula and checked flow with Doppler at the wrist and there was strong radial and ulnar signals.  There is no flow in the fistula distally when this was clamped.  I then tied it off distally and transected.  I oversewed the proximal with a running 5-0 Prolene suture in a mattress fashion.  Signals were again checked at the wrist and were noted to be strong and there was a radial artery pulse at the wrist at completion.  The wound was irrigated and closed in layers of Vicryl and Monocryl.  Dermabond is placed at the skin level.  The patient was awakened from anesthesia having tolerated the procedure well with out immediate complication.   EBL: 20cc   [name] C. Randie Heinz, MD [facility] Office: [phone] Pager: [phone]

## 2024-01-15 NOTE — Interval H&P Note (Signed)
 History and Physical Interval Note:  01/15/2024 7:18 AM  Melissa Howe  has presented today for surgery, with the diagnosis of END STAGE RENAL DISEASE.  The various methods of treatment have been discussed with the patient and family. After consideration of risks, benefits and other options for treatment, the patient has consented to  Procedure(s): LIGATION OF ARTERIOVENOUS  FISTULA (Left) as a surgical intervention.  The patient's history has been reviewed, patient examined, no change in status, stable for surgery.  I have reviewed the patient's chart and labs.  Questions were answered to the patient's satisfaction.     Lemar Livings

## 2024-01-16 ENCOUNTER — Encounter (HOSPITAL_COMMUNITY): Payer: Self-pay | Admitting: Vascular Surgery

## 2024-01-20 ENCOUNTER — Ambulatory Visit (INDEPENDENT_AMBULATORY_CARE_PROVIDER_SITE_OTHER): Payer: Medicare HMO | Admitting: Podiatry

## 2024-01-20 DIAGNOSIS — M79674 Pain in right toe(s): Secondary | ICD-10-CM

## 2024-01-20 DIAGNOSIS — M79675 Pain in left toe(s): Secondary | ICD-10-CM

## 2024-01-20 DIAGNOSIS — B351 Tinea unguium: Secondary | ICD-10-CM | POA: Diagnosis not present

## 2024-01-20 DIAGNOSIS — E1165 Type 2 diabetes mellitus with hyperglycemia: Secondary | ICD-10-CM | POA: Diagnosis not present

## 2024-01-20 NOTE — Progress Notes (Signed)
  Subjective:  Patient ID: Melissa Howe, female    DOB: [birth year],  MRN: [id number]  Chief Complaint  Patient presents with   Nail Problem    Nail trim    73 y.o. female returns for the above complaint.  Patient presents with thickened elongated dystrophic mycotic toenails x 10 mild pain on palpation hurts with ambulation is with pressure  Objective:  There were no vitals filed for this visit. Podiatric Exam: Vascular: dorsalis pedis and posterior tibial pulses are palpable bilateral. Capillary return is immediate. Temperature gradient is WNL. Skin turgor WNL  Sensorium: Normal Semmes Weinstein monofilament test. Normal tactile sensation bilaterally. Nail Exam: Pt has thick disfigured discolored nails with subungual debris noted bilateral entire nail hallux through fifth toenails.  Pain on palpation to the nails. Ulcer Exam: There is no evidence of ulcer or pre-ulcerative changes or infection. Orthopedic Exam: Muscle tone and strength are WNL. No limitations in general ROM. No crepitus or effusions noted.  Skin: No Porokeratosis. No infection or ulcers    Assessment & Plan:   No diagnosis found.   Patient was evaluated and treated and all questions answered.  Onychomycosis with pain  -Nails palliatively debrided as below. -Educated on self-care  Procedure: Nail Debridement Rationale: pain  Type of Debridement: manual, sharp debridement. Instrumentation: Nail nipper, rotary burr. Number of Nails: 10  Procedures and Treatment: Consent by patient was obtained for treatment procedures. The patient understood the discussion of treatment and procedures well. All questions were answered thoroughly reviewed. Debridement of mycotic and hypertrophic toenails, 1 through 5 bilateral and clearing of subungual debris. No ulceration, no infection noted.  Return Visit-Office Procedure: Patient instructed to return to the office for a follow up visit 3 months for continued evaluation and  treatment.  Nicholes Rough, DPM    No follow-ups on file.

## 2024-01-29 ENCOUNTER — Telehealth: Payer: Self-pay

## 2024-01-29 ENCOUNTER — Other Ambulatory Visit: Payer: Self-pay | Admitting: Orthopedic Surgery

## 2024-01-29 ENCOUNTER — Telehealth: Payer: Self-pay | Admitting: Cardiovascular Disease

## 2024-01-29 NOTE — Telephone Encounter (Signed)
   Pre-operative Risk Assessment    Patient Name: Melissa Howe  DOB: [birth year] MRN: [id number]   Date of last office visit: 04/13/2023 Date of next office visit: 1 year f/U not scheduled    Request for Surgical Clearance    Procedure:   amputation left fifth finger   Date of Surgery:  Clearance 02/25/24                                Surgeon: Dr. Betha Loa Surgeon's Group or Practice Name:  [facility] Phone number:  [phone] Fax number:  [phone]   Type of Clearance Requested:   - Medical  - Pharmacy:  Hold Aspirin and Clopidogrel (Plavix) TBD by cards    Type of Anesthesia:   Choice   Additional requests/questions:    Sander Nephew   01/29/2024, 9:10 AM

## 2024-01-29 NOTE — Telephone Encounter (Signed)
   Name: Melissa Howe  DOB: 03-25-1951  MRN: [id number]  Primary Cardiologist: Reatha Harps, MD   Preoperative team, please contact this patient and set up a phone call appointment for further preoperative risk assessment. Please obtain consent and complete medication review. Thank you for your help.  I confirm that guidance regarding antiplatelet and oral anticoagulation therapy has been completed and, if necessary, noted below.  Plavix prescribed by a noncardiology provider (vascular surgery) therefore recommendations for holding deferred to prescribing provider.    Ideally aspirin should be continued without interruption, however if the bleeding risk is too great, aspirin may be held for 5-7 days prior to surgery. Please resume aspirin post operatively when it is felt to be safe from a bleeding standpoint.    I also confirmed the patient resides in the state of [place]. As per [facility] Medical Board telemedicine laws, the patient must reside in the state in which the provider is licensed.   Carlos Levering, NP 01/29/2024, 10:01 AM [facility]

## 2024-01-29 NOTE — Telephone Encounter (Signed)
 1st attempt to reach pt regarding surgical clearance and the need for an TELE appointment. Someone answered the phone and there was silence.

## 2024-01-29 NOTE — Telephone Encounter (Signed)
  Patient Consent for Virtual Visit        Melissa Howe has provided verbal consent on 01/29/2024 for a virtual visit (video or telephone).   CONSENT FOR VIRTUAL VISIT FOR:  Melissa Howe  By participating in this virtual visit I agree to the following:  I hereby voluntarily request, consent and authorize [facility] and its employed or contracted physicians, physician assistants, nurse practitioners or other licensed health care professionals (the Practitioner), to provide me with telemedicine health care services (the "Services") as deemed necessary by the treating Practitioner. I acknowledge and consent to receive the Services by the Practitioner via telemedicine. I understand that the telemedicine visit will involve communicating with the Practitioner through live audiovisual communication technology and the disclosure of certain medical information by electronic transmission. I acknowledge that I have been given the opportunity to request an in-person assessment or other available alternative prior to the telemedicine visit and am voluntarily participating in the telemedicine visit.  I understand that I have the right to withhold or withdraw my consent to the use of telemedicine in the course of my care at any time, without affecting my right to future care or treatment, and that the Practitioner or I may terminate the telemedicine visit at any time. I understand that I have the right to inspect all information obtained and/or recorded in the course of the telemedicine visit and may receive copies of available information for a reasonable fee.  I understand that some of the potential risks of receiving the Services via telemedicine include:  Delay or interruption in medical evaluation due to technological equipment failure or disruption; Information transmitted may not be sufficient (e.g. poor resolution of images) to allow for appropriate medical decision making by the  Practitioner; and/or  In rare instances, security protocols could fail, causing a breach of personal health information.  Furthermore, I acknowledge that it is my responsibility to provide information about my medical history, conditions and care that is complete and accurate to the best of my ability. I acknowledge that Practitioner's advice, recommendations, and/or decision may be based on factors not within their control, such as incomplete or inaccurate data provided by me or distortions of diagnostic images or specimens that may result from electronic transmissions. I understand that the practice of medicine is not an exact science and that Practitioner makes no warranties or guarantees regarding treatment outcomes. I acknowledge that a copy of this consent can be made available to me via my patient portal [facility] MyChart), or I can request a printed copy by calling the office of [facility].    I understand that my insurance will be billed for this visit.   I have read or had this consent read to me. I understand the contents of this consent, which adequately explains the benefits and risks of the Services being provided via telemedicine.  I have been provided ample opportunity to ask questions regarding this consent and the Services and have had my questions answered to my satisfaction. I give my informed consent for the services to be provided through the use of telemedicine in my medical care

## 2024-01-29 NOTE — Telephone Encounter (Signed)
 Preop televisit has now been scheduled, med rec and consent done.

## 2024-02-09 ENCOUNTER — Ambulatory Visit

## 2024-02-09 ENCOUNTER — Emergency Department (HOSPITAL_COMMUNITY)

## 2024-02-09 ENCOUNTER — Inpatient Hospital Stay (HOSPITAL_COMMUNITY)
Admission: EM | Admit: 2024-02-09 | Discharge: 2024-02-12 | DRG: 314 | Disposition: A | Discharge status: Home Health | Attending: Family Medicine | Admitting: Family Medicine

## 2024-02-09 ENCOUNTER — Encounter (HOSPITAL_COMMUNITY): Payer: Self-pay

## 2024-02-09 ENCOUNTER — Other Ambulatory Visit: Payer: Self-pay

## 2024-02-09 DIAGNOSIS — E1152 Type 2 diabetes mellitus with diabetic peripheral angiopathy with gangrene: Secondary | ICD-10-CM | POA: Diagnosis present

## 2024-02-09 DIAGNOSIS — N2581 Secondary hyperparathyroidism of renal origin: Secondary | ICD-10-CM | POA: Diagnosis present

## 2024-02-09 DIAGNOSIS — I2510 Atherosclerotic heart disease of native coronary artery without angina pectoris: Secondary | ICD-10-CM | POA: Diagnosis present

## 2024-02-09 DIAGNOSIS — Z794 Long term (current) use of insulin: Secondary | ICD-10-CM | POA: Diagnosis not present

## 2024-02-09 DIAGNOSIS — I509 Heart failure, unspecified: Secondary | ICD-10-CM

## 2024-02-09 DIAGNOSIS — Z7902 Long term (current) use of antithrombotics/antiplatelets: Secondary | ICD-10-CM

## 2024-02-09 DIAGNOSIS — Z7984 Long term (current) use of oral hypoglycemic drugs: Secondary | ICD-10-CM | POA: Diagnosis not present

## 2024-02-09 DIAGNOSIS — E1122 Type 2 diabetes mellitus with diabetic chronic kidney disease: Secondary | ICD-10-CM | POA: Diagnosis present

## 2024-02-09 DIAGNOSIS — D509 Iron deficiency anemia, unspecified: Secondary | ICD-10-CM | POA: Diagnosis present

## 2024-02-09 DIAGNOSIS — E872 Acidosis, unspecified: Secondary | ICD-10-CM | POA: Diagnosis present

## 2024-02-09 DIAGNOSIS — E1165 Type 2 diabetes mellitus with hyperglycemia: Secondary | ICD-10-CM

## 2024-02-09 DIAGNOSIS — I132 Hypertensive heart and chronic kidney disease with heart failure and with stage 5 chronic kidney disease, or end stage renal disease: Secondary | ICD-10-CM | POA: Diagnosis present

## 2024-02-09 DIAGNOSIS — E1140 Type 2 diabetes mellitus with diabetic neuropathy, unspecified: Secondary | ICD-10-CM | POA: Diagnosis present

## 2024-02-09 DIAGNOSIS — R112 Nausea with vomiting, unspecified: Secondary | ICD-10-CM | POA: Diagnosis not present

## 2024-02-09 DIAGNOSIS — E860 Dehydration: Secondary | ICD-10-CM | POA: Diagnosis present

## 2024-02-09 DIAGNOSIS — Z79899 Other long term (current) drug therapy: Secondary | ICD-10-CM

## 2024-02-09 DIAGNOSIS — Z7982 Long term (current) use of aspirin: Secondary | ICD-10-CM

## 2024-02-09 DIAGNOSIS — I9589 Other hypotension: Secondary | ICD-10-CM | POA: Diagnosis present

## 2024-02-09 DIAGNOSIS — Z91128 Patient's intentional underdosing of medication regimen for other reason: Secondary | ICD-10-CM

## 2024-02-09 DIAGNOSIS — Z87891 Personal history of nicotine dependence: Secondary | ICD-10-CM

## 2024-02-09 DIAGNOSIS — I252 Old myocardial infarction: Secondary | ICD-10-CM | POA: Diagnosis not present

## 2024-02-09 DIAGNOSIS — D631 Anemia in chronic kidney disease: Secondary | ICD-10-CM | POA: Diagnosis present

## 2024-02-09 DIAGNOSIS — R6520 Severe sepsis without septic shock: Principal | ICD-10-CM

## 2024-02-09 DIAGNOSIS — Z8249 Family history of ischemic heart disease and other diseases of the circulatory system: Secondary | ICD-10-CM

## 2024-02-09 DIAGNOSIS — B9789 Other viral agents as the cause of diseases classified elsewhere: Secondary | ICD-10-CM | POA: Diagnosis present

## 2024-02-09 DIAGNOSIS — I959 Hypotension, unspecified: Secondary | ICD-10-CM | POA: Diagnosis present

## 2024-02-09 DIAGNOSIS — Z992 Dependence on renal dialysis: Secondary | ICD-10-CM

## 2024-02-09 DIAGNOSIS — Z789 Other specified health status: Secondary | ICD-10-CM

## 2024-02-09 DIAGNOSIS — I255 Ischemic cardiomyopathy: Secondary | ICD-10-CM | POA: Diagnosis present

## 2024-02-09 DIAGNOSIS — E119 Type 2 diabetes mellitus without complications: Secondary | ICD-10-CM

## 2024-02-09 DIAGNOSIS — I96 Gangrene, not elsewhere classified: Secondary | ICD-10-CM | POA: Insufficient documentation

## 2024-02-09 DIAGNOSIS — B9710 Unspecified enterovirus as the cause of diseases classified elsewhere: Secondary | ICD-10-CM | POA: Diagnosis present

## 2024-02-09 DIAGNOSIS — E785 Hyperlipidemia, unspecified: Secondary | ICD-10-CM | POA: Diagnosis present

## 2024-02-09 DIAGNOSIS — Z955 Presence of coronary angioplasty implant and graft: Secondary | ICD-10-CM

## 2024-02-09 DIAGNOSIS — N186 End stage renal disease: Secondary | ICD-10-CM | POA: Diagnosis present

## 2024-02-09 DIAGNOSIS — E1142 Type 2 diabetes mellitus with diabetic polyneuropathy: Secondary | ICD-10-CM | POA: Diagnosis not present

## 2024-02-09 DIAGNOSIS — R531 Weakness: Secondary | ICD-10-CM | POA: Diagnosis present

## 2024-02-09 DIAGNOSIS — T444X6A Underdosing of predominantly alpha-adrenoreceptor agonists, initial encounter: Secondary | ICD-10-CM | POA: Diagnosis present

## 2024-02-09 LAB — RESPIRATORY PANEL BY PCR

## 2024-02-09 LAB — CBC
HCT: 51.3 % — ABNORMAL HIGH (ref 36.0–46.0)
Hemoglobin: 16 g/dL — ABNORMAL HIGH (ref 12.0–15.0)
MCH: 29.9 pg (ref 26.0–34.0)
MCHC: 31.2 g/dL (ref 30.0–36.0)
MCV: 95.9 fL (ref 80.0–100.0)
Platelets: 300 10*3/uL (ref 150–400)
RBC: 5.35 MIL/uL — ABNORMAL HIGH (ref 3.87–5.11)
RDW: 14.3 % (ref 11.5–15.5)
WBC: 20.5 10*3/uL — ABNORMAL HIGH (ref 4.0–10.5)
nRBC: 0.1 % (ref 0.0–0.2)

## 2024-02-09 LAB — I-STAT CG4 LACTIC ACID, ED
Lactic Acid, Venous: 3.9 mmol/L (ref 0.5–1.9)
Lactic Acid, Venous: 3.6 mmol/L (ref 0.5–1.9)

## 2024-02-09 LAB — TROPONIN I (HIGH SENSITIVITY)
Troponin I (High Sensitivity): 27 ng/L — ABNORMAL HIGH (ref <18)
Troponin I (High Sensitivity): 34 ng/L — ABNORMAL HIGH (ref <18)

## 2024-02-09 LAB — BASIC METABOLIC PANEL WITH GFR
Anion gap: 14 (ref 5–15)
BUN: 26 mg/dL — ABNORMAL HIGH (ref 8–23)
CO2: 27 mmol/L (ref 22–32)
Calcium: 9.8 mg/dL (ref 8.9–10.3)
Chloride: 93 mmol/L — ABNORMAL LOW (ref 98–111)
Creatinine, Ser: 8.69 mg/dL — ABNORMAL HIGH (ref 0.44–1.00)
GFR, Estimated: 4 mL/min — ABNORMAL LOW (ref >60)
Glucose, Bld: 304 mg/dL — ABNORMAL HIGH (ref 70–99)
Potassium: 4 mmol/L (ref 3.5–5.1)
Sodium: 134 mmol/L — ABNORMAL LOW (ref 135–145)

## 2024-02-09 LAB — LACTIC ACID, PLASMA
Lactic Acid, Venous: 2.5 mmol/L (ref 0.5–1.9)
Lactic Acid, Venous: 1.9 mmol/L (ref 0.5–1.9)

## 2024-02-09 LAB — CBG MONITORING, ED: Glucose-Capillary: 184 mg/dL — ABNORMAL HIGH (ref 70–99)

## 2024-02-09 LAB — PROTIME-INR
INR: 1.1 (ref 0.8–1.2)
Prothrombin Time: 14.1 s (ref 11.4–15.2)

## 2024-02-09 LAB — BETA-HYDROXYBUTYRIC ACID: Beta-Hydroxybutyric Acid: 0.05 mmol/L (ref 0.05–0.27)

## 2024-02-09 LAB — BRAIN NATRIURETIC PEPTIDE: B Natriuretic Peptide: 52.7 pg/mL (ref 0.0–100.0)

## 2024-02-09 LAB — APTT: aPTT: 29 s (ref 24–36)

## 2024-02-09 MED ORDER — SODIUM CHLORIDE 0.9 % IV SOLN: 2.0000 g | Freq: Once | INTRAVENOUS | Status: DC

## 2024-02-09 MED ORDER — CINACALCET HCL 30 MG PO TABS
30.0000 mg | ORAL_TABLET | Freq: Every day | ORAL | Status: DC
  Administered 2024-02-09 – 2024-02-12 (×4): 30 mg via ORAL
  Filled 2024-02-09 (×4): qty 1

## 2024-02-09 MED ORDER — POLYVINYL ALCOHOL 1.4 % OP SOLN
1.0000 [drp] | OPHTHALMIC | Status: DC | PRN
  Filled 2024-02-09: qty 15

## 2024-02-09 MED ORDER — ATORVASTATIN CALCIUM 80 MG PO TABS
80.0000 mg | ORAL_TABLET | Freq: Every day | ORAL | Status: DC
  Administered 2024-02-09 – 2024-02-11 (×3): 80 mg via ORAL
  Filled 2024-02-09: qty 1
  Filled 2024-02-09: qty 2
  Filled 2024-02-09 (×2): qty 1

## 2024-02-09 MED ORDER — VANCOMYCIN HCL 1750 MG/350ML IV SOLN
1750.0000 mg | Freq: Once | INTRAVENOUS | Status: AC
  Administered 2024-02-09: 1750 mg via INTRAVENOUS
  Filled 2024-02-09: qty 350

## 2024-02-09 MED ORDER — CLEAR EYES COMPLETE OP SOLN: Freq: Every day | OPHTHALMIC | Status: DC | PRN

## 2024-02-09 MED ORDER — SEVELAMER CARBONATE 800 MG PO TABS
800.0000 mg | ORAL_TABLET | ORAL | Status: DC
  Administered 2024-02-09 – 2024-02-10 (×2): 800 mg via ORAL
  Filled 2024-02-09 (×2): qty 1

## 2024-02-09 MED ORDER — LACTATED RINGERS IV SOLN
150.0000 mL/h | INTRAVENOUS | Status: DC
  Administered 2024-02-09 – 2024-02-10 (×3): 150 mL/h via INTRAVENOUS

## 2024-02-09 MED ORDER — VANCOMYCIN HCL IN DEXTROSE 1-5 GM/200ML-% IV SOLN: 1000.0000 mg | Freq: Once | INTRAVENOUS | Status: DC

## 2024-02-09 MED ORDER — SEVELAMER CARBONATE 800 MG PO TABS
3200.0000 mg | ORAL_TABLET | Freq: Three times a day (TID) | ORAL | Status: DC
  Administered 2024-02-10 – 2024-02-11 (×5): 3200 mg via ORAL
  Filled 2024-02-09 (×5): qty 4

## 2024-02-09 MED ORDER — CLOPIDOGREL BISULFATE 75 MG PO TABS
75.0000 mg | ORAL_TABLET | Freq: Every day | ORAL | Status: DC
  Administered 2024-02-09 – 2024-02-12 (×4): 75 mg via ORAL
  Filled 2024-02-09 (×4): qty 1

## 2024-02-09 MED ORDER — LACTATED RINGERS IV BOLUS (SEPSIS)
500.0000 mL | Freq: Once | INTRAVENOUS | Status: AC
  Administered 2024-02-09: 500 mL via INTRAVENOUS

## 2024-02-09 MED ORDER — MIDODRINE HCL 5 MG PO TABS
2.5000 mg | ORAL_TABLET | Freq: Three times a day (TID) | ORAL | Status: DC
  Administered 2024-02-09 – 2024-02-10 (×2): 2.5 mg via ORAL
  Filled 2024-02-09 (×2): qty 1

## 2024-02-09 MED ORDER — SEVELAMER CARBONATE 800 MG PO TABS
800.0000 mg | ORAL_TABLET | ORAL | Status: DC
Start: 2024-02-09 — End: 2024-02-09

## 2024-02-09 MED ORDER — GABAPENTIN 100 MG PO CAPS
100.0000 mg | ORAL_CAPSULE | Freq: Two times a day (BID) | ORAL | Status: DC
  Administered 2024-02-09 – 2024-02-12 (×6): 100 mg via ORAL
  Filled 2024-02-09 (×7): qty 1

## 2024-02-09 MED ORDER — ACETAMINOPHEN 500 MG PO TABS
1000.0000 mg | ORAL_TABLET | Freq: Four times a day (QID) | ORAL | Status: DC | PRN
  Administered 2024-02-11: 1000 mg via ORAL
  Filled 2024-02-09: qty 2

## 2024-02-09 MED ORDER — OXYCODONE-ACETAMINOPHEN 5-325 MG PO TABS
1.0000 | ORAL_TABLET | Freq: Four times a day (QID) | ORAL | Status: DC | PRN
  Administered 2024-02-10: 1 via ORAL
  Filled 2024-02-09: qty 1

## 2024-02-09 MED ORDER — POTASSIUM CHLORIDE CRYS ER 20 MEQ PO TBCR
20.0000 meq | EXTENDED_RELEASE_TABLET | Freq: Every day | ORAL | Status: DC
  Administered 2024-02-09 – 2024-02-12 (×4): 20 meq via ORAL
  Filled 2024-02-09 (×4): qty 1

## 2024-02-09 MED ORDER — CALCITRIOL 0.5 MCG PO CAPS
0.5000 ug | ORAL_CAPSULE | Freq: Every day | ORAL | Status: DC
  Administered 2024-02-09 – 2024-02-12 (×4): 0.5 ug via ORAL
  Filled 2024-02-09 (×4): qty 1

## 2024-02-09 MED ORDER — LACTATED RINGERS IV BOLUS
500.0000 mL | Freq: Once | INTRAVENOUS | Status: AC
  Administered 2024-02-09: 500 mL via INTRAVENOUS

## 2024-02-09 MED ORDER — METRONIDAZOLE 500 MG/100ML IV SOLN
500.0000 mg | Freq: Two times a day (BID) | INTRAVENOUS | Status: DC
  Administered 2024-02-09 – 2024-02-11 (×4): 500 mg via INTRAVENOUS
  Filled 2024-02-09 (×4): qty 100

## 2024-02-09 MED ORDER — HEPARIN SODIUM (PORCINE) 5000 UNIT/ML IJ SOLN
5000.0000 [IU] | Freq: Three times a day (TID) | INTRAMUSCULAR | Status: DC
  Administered 2024-02-09 – 2024-02-12 (×9): 5000 [IU] via SUBCUTANEOUS
  Filled 2024-02-09 (×10): qty 1

## 2024-02-09 MED ORDER — INSULIN ASPART 100 UNIT/ML IJ SOLN
0.0000 [IU] | Freq: Three times a day (TID) | INTRAMUSCULAR | Status: DC
  Administered 2024-02-11 – 2024-02-12 (×3): 1 [IU] via SUBCUTANEOUS

## 2024-02-09 MED ORDER — ASPIRIN 81 MG PO CHEW
81.0000 mg | CHEWABLE_TABLET | Freq: Every day | ORAL | Status: DC
  Administered 2024-02-09 – 2024-02-12 (×4): 81 mg via ORAL
  Filled 2024-02-09 (×4): qty 1

## 2024-02-09 MED ORDER — SODIUM CHLORIDE 0.9 % IV SOLN
1.0000 g | INTRAVENOUS | Status: DC
  Administered 2024-02-09 – 2024-02-10 (×2): 1 g via INTRAVENOUS
  Filled 2024-02-09 (×3): qty 10

## 2024-02-09 MED ORDER — VANCOMYCIN VARIABLE DOSE PER UNSTABLE RENAL FUNCTION (PHARMACIST DOSING): Status: DC

## 2024-02-09 NOTE — Assessment & Plan Note (Addendum)
 Chronic, follows with vascular surgery.  On fifth digit of left hand.  Appears dry.  No erythema, purulent fluid, pain. - Consider VVS consult over admission if worsening; otherwise, outpatient follow-up as scheduled for amputation

## 2024-02-09 NOTE — Assessment & Plan Note (Addendum)
 Recently started on iron supplementation.  Notes dark stools only after starting PO iron.  Hemoglobin 16.0, possible concentration due to low volume status.  Less likely GI bleed. - AM CBCs

## 2024-02-09 NOTE — Assessment & Plan Note (Addendum)
 Most recent A1c August 2024 at 8.1.  She is on Lantus 20 units daily along with linagliptin 5 mg daily.  Complicated by peripheral neuropathy. - Start sensitive sliding scale insulin here in the hospital - Hold home linagliptin, insulin glargine for now - Repeat home A1c, lipid panel -- CBG elevated on admission with AG 14, will follow up with ABG and BHB

## 2024-02-09 NOTE — Progress Notes (Signed)
 Elink is following code sepsis.

## 2024-02-09 NOTE — Assessment & Plan Note (Addendum)
 MAP reassuringly above 65.  Patient reports hypotension since starting peritoneal hemodialysis 3 times daily a few months ago.  Seen at [facility] ED 11/26/2023 for similar concerns: Generalized weakness, dizziness, hypotension.  Believes that has exposure to prior URI which would explain episode of emesis and poor p.o. intake.  This, in turn, likely contributed to underlying hypotension leading to vasovagal presyncope. Status appears to be improving on administration of IV fluids.  Previously prescribed midodrine 5 mg 3 times daily, however has run out of this medication for approximately a week.  - Admit to [facility] Medicine Teaching Service, Dr. Grandville Lax attending. - Nephrology consulted in the ED, appreciate further recommendations - Start midodrine 2.5 mg 3 times daily and titrate to home dose as able - S/p 1 L IV LR in ED - Continue IV LR at 150 mL an hour x 20, can stop fluids if midodrine improves BP patient is a good p.o., will want to be judicious with fluids given ESRD - S/p cefepime, Flagyl, vancomycin in setting of leukocytosis, can continue these until blood cultures are negative - Follow-up blood cultures x2 - Labs: Respiratory panel, a.m. RFPs, AM CBCs - Strict I/Os - Daily weights - Fall precautions

## 2024-02-09 NOTE — Assessment & Plan Note (Addendum)
 Hyperlipidemia: Continue Lipitor 80 mg nightly, continue home sevelamer. Coronary artery disease: Continue DAPT (aspirin, Plavix)

## 2024-02-09 NOTE — Progress Notes (Signed)
 Pharmacy Antibiotic Note  Melissa Howe is a 73 y.o. female admitted on 02/09/2024 with sepsis.  Pharmacy has been consulted for vancomycin and cefepime dosing. Noted patient ESRD on PD.   Plan: Cefepime 1g q24h.  Will give loading dose of 1750mg  of vancomycin, consider levels in 3-5 days to maintain trough > 15.  Follow culture data for de-escalation.  Monitor for s/sx of resolution.   Height: 5\' 4"  (162.6 cm) Weight: 81.2 kg (179 lb) IBW/kg (Calculated) : 54.7  Temp (24hrs), Avg:97.8 F (36.6 C), Min:97.8 F (36.6 C), Max:97.8 F (36.6 C)  Recent Labs  Lab 02/09/24 1151  WBC 20.5*  CREATININE 8.69*    Estimated Creatinine Clearance: 5.9 mL/min (A) (by C-G formula based on SCr of 8.69 mg/dL (H)).    No Known Allergies  Antimicrobials this admission: Cefepime 4/15 >>  Vancomycin 4/15 >>   Microbiology results: 4/15 BCx:   Thank you for allowing pharmacy to be a part of this patient's care.  Mamie Searles, PharmD, BCCCP  02/09/2024 1:21 PM

## 2024-02-09 NOTE — H&P (Addendum)
 Hospital Admission History and Physical Service Pager: [phone]  Patient name: Melissa Howe Medical record number: [id number] Date of Birth: 1950-12-11 Age: 73 y.o. Gender: female  Primary Care Provider: Raymon Mutton., FNP Consultants: Nephrology in ED Code Status: FULL Preferred Emergency Contact:   Contact Information     Name Relation Home Work Mobile   Evans,Crystal Daughter [phone]      Other Contacts     Name Relation Home Work Mobile   Duarte Brother   [phone]       Chief Complaint: Hypotension  Assessment and Plan: Melissa Howe is a 73 y.o. female on peritoneal dialysis presenting with hypotension in the setting of nausea and vomiting. Differentials include:  Viral URI, possible: Per patient, had a few-day history of cough (now resolved) with chills and an episode of nausea with emesis.  Also reports poor p.o. intake over this time. The patient already deals with chronic hypotension after initiating peritoneal dialysis -- emesis yesterday could have exacerbated this.  Notes that grandson had a cold recently. Urosepsis, less likely: Conceivably could have contributed to nausea, vomiting, therefore worsening hypotension.  BUN of 26 appears to be patient's baseline.  Mental status remains unchanged. Gram-positive bacteremia, less likely: Patient has dry gangrene of LUE fifth digit, WBC 20.5 and peritoneal dialysis site, both of which could contribute to initial septic picture.  However she is well-appearing.  Blood cultures pending, however patient currently afebrile, with normal heart rate.  Both finger and dialysis site are without erythematous or purulent changes.  Cardiogenic shock, less likely: Previous history of MI.  However EKG on concerning for cardiac pathology.  Tropes pending.  Overall, while patient does technically meet sepsis criteria, she is well-appearing without an obvious source of infection.  She  is afebrile with negative chest x-ray.  While WBC is 20.5, she also has an increase in hemoglobin and platelets from baseline, leading to a question of hemoconcentration with dehydration.  Dehydration, in conjunction with chronic hypotension with peritoneal dialysis with patient off of home midodrine, seems to be a leading cause for her hypotension and increased lactic acid. Assessment & Plan Hypotension MAP reassuringly above 65.  Patient reports hypotension since starting peritoneal hemodialysis 3 times daily a few months ago.  Seen at [facility] ED 11/26/2023 for similar concerns: Generalized weakness, dizziness, hypotension.  Believes that has exposure to prior URI which would explain episode of emesis and poor p.o. intake.  This, in turn, likely contributed to underlying hypotension leading to vasovagal presyncope. Status appears to be improving on administration of IV fluids.  Previously prescribed midodrine 5 mg 3 times daily, however has run out of this medication for approximately a week.  - Admit to [facility] Medicine Teaching Service, Dr. Lum Babe attending. - Nephrology consulted in the ED, appreciate further recommendations - Start midodrine 2.5 mg 3 times daily and titrate to home dose as able - S/p 1 L IV LR in ED - Continue IV LR at 150 mL an hour x 20, can stop fluids if midodrine improves BP patient is a good p.o., will want to be judicious with fluids given ESRD - S/p cefepime, Flagyl, vancomycin in setting of leukocytosis, can continue these until blood cultures are negative - Follow-up blood cultures x2 - Labs: Respiratory panel, a.m. RFPs, AM CBCs - Strict I/Os - Daily weights - Fall precautions IDA (iron deficiency anemia) Recently started on iron supplementation.  Notes dark stools only after starting PO iron.  Hemoglobin  16.0, possible concentration due to low volume status.  Less likely GI bleed. - AM CBCs T2DM (type 2 diabetes mellitus) (HCC) Most recent A1c August 2024 at  8.1.  She is on Lantus 20 units daily along with linagliptin 5 mg daily.  Complicated by peripheral neuropathy. - Start sensitive sliding scale insulin here in the hospital - Hold home linagliptin, insulin glargine for now - Repeat home A1c, lipid panel -- CBG elevated on admission with AG 14, will follow up with ABG and BHB ESRD (end stage renal disease) on dialysis [facility]) Peritoneal dialysis.  3 times daily at home.  Site clean and dry.  No abdominal tenderness on palpation.  Unlikely infectious source -- patient appears well. - Appreciate nephrology recommendations - Continue peritoneal dialysis in the hospital - AM renal function panels. Dry gangrene (HCC) Chronic, follows with vascular surgery.  On fifth digit of left hand.  Appears dry.  No erythema, purulent fluid, pain. - Consider VVS consult over admission if worsening; otherwise, outpatient follow-up as scheduled for amputation CHF (congestive heart failure) (HCC) Echo 03/2023 showed normal LVEF (60-65%) with grade 2 diastolic dysfunction.  Not particularly volume up though weight is up from home baseline, lung fields clear on auscultation, BNP normal. Trop elevated likely in setting of demand/ESRD. -Continue to monitor, consider repeat echo as needed -Caution with continued fluids Chronic health problem Hyperlipidemia: Continue Lipitor 80 mg nightly, continue home sevelamer. Coronary artery disease: Continue DAPT (aspirin, Plavix)  FEN/GI: Renal diet VTE Prophylaxis: Heparin   Disposition: Progressive  History of Present Illness:  Melissa Howe is a 73 y.o. female with a past medical history of HTN, HLD, [name] s/p stents, NSTEMI, CHF, ESRD on periotoneal dialysis 3 times daily who presents with concern for hypotension. Given midodrine in January by nephrology.   BP has been low over the last couple months. She was given midodrine in January 2025, but they did not give her many pills, so she ran out. She has been without these  for an unknown period of time. She has been taking other medications without issue, as her daughter helps with them. No fevers, endorses chills. She has had recent emesis and diarrhea. No abdominal pain.   She notes her stool was black, but she just started iron pills. She does make urine anymore. She has not had much of an appetite since yesterday, but she sometimes does not have a good appetite at baseline. She has not noticed much weight loss; her weight at home is around 77 kg. No chest pain, SOB.  Today, her BP was low, and she did not feel well.  Had some coughing over the last couple days, which is now resolved.  Noted nausea and vomiting yesterday.  Remembered that grandson had a cold sometime ago.  She had peritoneal dialysis this morning, and she felt bad before then, too. She has not really been dizzy. She states that her BP averages around 80s/40s at home.   She fell yesterday, but she did not hit her head. She caught herself on the chair and pulled herself up. She usually walks with a walker.  In the ED, patient received Flagyl, cefepime, vancomycin per sepsis protocol as well as 1 L IV LR.  Cardiac workup showed slightly elevated initial Tropes.  EKG was NSR.  Chest x-ray on concerning for pneumonia.  Blood cultures drawn.  Euvolemic on exam.  Nephrology consulted in ED.  Review Of Systems: Per HPI  Pertinent Past Medical History: ESRD on peritoneal  dialysis TID Hypertension (when on HD) Peripheral neuropathy CHF Gangrene of left pinky finger HTN Allergies Anemia of chronic disease [name] T2DM (T1DM also listed in chart though unsure of accuracy) Diastolic HF Depression Diabetic macular edema Gastric ulcers HLD IDA MSSA bacteremia NSTEMI Syncope Remainder reviewed in history tab.   Pertinent Past Surgical History: Cardiac stent placement L brachiocephalic fistula placement and tying off due to gangrene as above Stents placed in legs Appendectomy Remainder reviewed in  history tab.   Pertinent Social History: Tobacco use: Former, 40 years ago, 7.5 ppd Alcohol use: No Other Substance use: No Lives with daughter who takes care of her  Pertinent Family History: None pertinent Remainder reviewed in history tab.   Important Outpatient Medications: Tylenol 650 every 6 hours as needed Aspirin 81 mg daily Lipitor 80 mg nightly Calcitriol 0.5 mcg daily Cinacalcet 30 mg daily Plavix 75 mg daily Gabapentin 100 mg twice daily Clear Eyes complete 1 drop both eyes Glargine 20 units every morning Remainder reviewed in medication history.   Objective: BP (!) 112/35 (BP Location: Right Wrist)   Pulse 93   Temp 97.8 F (36.6 C) (Oral)   Resp 15   Ht 5\' 4"  (1.626 m)   Wt 179 lb (81.2 kg)   SpO2 100%   BMI 30.73 kg/m  Exam: General: Older woman laying in Emergency Department bed, no acute distress, conversant, grossly alert and oriented Eyes: Anicteric Cardiovascular: Regular rate and rhythm, no murmurs rubs or gallops Respiratory: CTAB, no wheezes, rhonchi, rales Gastrointestinal: Peritoneal dialysis site without obvious erythema or drainage.  Nontender to palpation.  Bowel sounds present. MSK: Strength 5/5 in upper and lower extremities.  Dorsiflexion and plantarflexion intact.  Mycotic digits on lower extremities, no acute issues. Neuro: Notes bilateral lower extremity neuropathic pain on removing socks to ankles.  Otherwise EOMI grossly.  No gross focal findings. Psych: Appropriate mood and affect.  Labs:  CBC BMET  Recent Labs  Lab 02/09/24 1151  WBC 20.5*  HGB 16.0*  HCT 51.3*  PLT 300   Recent Labs  Lab 02/09/24 1151  NA 134*  K 4.0  CL 93*  CO2 27  BUN 26*  CREATININE 8.69*  GLUCOSE 304*  CALCIUM 9.8    Pertinent additional labs  Troponin 27 B NP 52.7 Lactic acid 3.9, 3.6  EKG: NSR, Qtc 411  Imaging Studies Performed:  Chest x-ray (4/15):  IMPRESSION: Low volumes. No acute findings.  Gwyndolyn Lerner,  MD 02/09/2024, 2:20 PM PGY-1, [facility]  FPTS Intern pager: [phone], text pages welcome Secure chat group [facility] Teaching Service   I agree with the assessment and plan as documented above.  Genetta Kenning, MD PGY-2, [facility]

## 2024-02-09 NOTE — ED Triage Notes (Signed)
 Pt from home with ems for hypotension At home 77/49, 82 palpated with Fire, weak radial pulses. Left arm restricted, peritoneal dialysis pt. Pt was on midodrine for a week last month but is not taking it anymore.   Last Bp 84/57 Pt a.o, c.o weakness

## 2024-02-09 NOTE — Consult Note (Signed)
 Renal Service Consult Note [facility]  Melissa Howe 02/09/2024 Melissa Krabbe, MD Requesting Physician: Dr. Lum Babe  Reason for Consult: ESRD pt on CCPD HPI: The patient is a 73 y.o. year-old w/ PMH as below who presented to ED from home for hypotension.  With EMS blood pressure was 78/50, heart rate 82 with weak radial pulses.  Patient used to be on midodrine but is not taking it anymore.In the ED patient is on peritoneal dialysis dialysis which she does 3 times a day, she does not do it at night.   Pt seen in ED. patient is an elderly lady who has help at home with her peritoneal dialysis.  She does day time dialysis with 8-hour dwell, in other words, CAPD.  No history of cloudy fluid or abdominal pain.  No productive cough or confusion.  She has help at home.  The daughter stated earlier that the fluid coming off this morning was clear.    ROS - denies CP, no joint pain, no HA, no blurry vision, no rash, no diarrhea, no nausea/ vomiting  PMH: CHF [name] Diabetes mellitus ESRD on CAPD HL HTN Ischemic cardiomyopathy PAD  Past Surgical History  Past Surgical History:  Procedure Laterality Date   ABDOMINAL AORTOGRAM W/LOWER EXTREMITY N/A 10/15/2022   Procedure: ABDOMINAL AORTOGRAM W/LOWER EXTREMITY;  Surgeon: Victorino Sparrow, MD;  Location: [facility] INVASIVE CV LAB;  Service: Cardiovascular;  Laterality: N/A;   ABDOMINAL AORTOGRAM W/LOWER EXTREMITY N/A 11/30/2023   Procedure: ABDOMINAL AORTOGRAM W/LOWER EXTREMITY;  Surgeon: Maeola Harman, MD;  Location: [facility] INVASIVE CV LAB;  Service: Cardiovascular;  Laterality: N/A;   APPENDECTOMY     AV FISTULA PLACEMENT Left 10/05/2019   Procedure: INSERTION OF ARTERIOVENOUS (AV) GORE-TEX VASCULAR GRAFT LEFT ARM;  Surgeon: Larina Earthly, MD;  Location: [facility] OR;  Service: Vascular;  Laterality: Left;   AV FISTULA PLACEMENT Left 02/27/2021   Procedure: LEFT ARM FIRST STAGE BASILIC VEIN  ARTERIOVENOUS (AV) FISTULA CREATION;   Surgeon: Maeola Harman, MD;  Location: [facility] OR;  Service: Vascular;  Laterality: Left;   BASCILIC VEIN TRANSPOSITION Left 04/17/2021   Procedure: LEFT SECOND STAGE BASCILIC VEIN TRANSPOSITION;  Surgeon: Maeola Harman, MD;  Location: [facility] OR;  Service: Vascular;  Laterality: Left;   BIOPSY  07/24/2020   Procedure: BIOPSY;  Surgeon: Sherrilyn Rist, MD;  Location: [facility] ENDOSCOPY;  Service: Gastroenterology;;   BUBBLE STUDY  01/02/2020   Procedure: BUBBLE STUDY;  Surgeon: Parke Poisson, MD;  Location: [facility] ENDOSCOPY;  Service: Cardiology;;   CAPD INSERTION N/A 05/01/2023   Procedure: LAPAROSCOPIC INSERTION CONTINUOUS AMBULATORY PERITONEAL DIALYSIS  (CAPD) CATHETER;  Surgeon: Maeola Harman, MD;  Location: [facility] OR;  Service: Vascular;  Laterality: N/A;   CARDIAC CATHETERIZATION     COLONOSCOPY WITH PROPOFOL N/A 07/24/2020   Procedure: COLONOSCOPY WITH PROPOFOL;  Surgeon: Sherrilyn Rist, MD;  Location: [facility] ENDOSCOPY;  Service: Gastroenterology;  Laterality: N/A;   CORONARY STENT INTERVENTION N/A 07/12/2021   Procedure: CORONARY STENT INTERVENTION;  Surgeon: Corky Crafts, MD;  Location: [facility] INVASIVE CV LAB;  Service: Cardiovascular;  Laterality: N/A;   CORONARY ULTRASOUND/IVUS N/A 07/12/2021   Procedure: Intravascular Ultrasound/IVUS;  Surgeon: Corky Crafts, MD;  Location: [facility] INVASIVE CV LAB;  Service: Cardiovascular;  Laterality: N/A;   ESOPHAGOGASTRODUODENOSCOPY (EGD) WITH PROPOFOL N/A 07/24/2020   Procedure: ESOPHAGOGASTRODUODENOSCOPY (EGD) WITH PROPOFOL;  Surgeon: Sherrilyn Rist, MD;  Location: [facility] ENDOSCOPY;  Service: Gastroenterology;  Laterality: N/A;  EYE SURGERY Bilateral    Cataract   FOREIGN BODY REMOVAL Left 05/13/2018   Procedure: FOREIGN BODY REMOVAL left foot;  Surgeon: Jasmine Mesi, MD;  Location: [facility] OR;  Service: Orthopedics;  Laterality: Left;   I & D EXTREMITY Left 05/21/2018   Procedure: LEFT FOOT DEBRIDEMENT AND WOUND  CLOSURE;  Surgeon: Timothy Ford, MD;  Location: [facility] OR;  Service: Orthopedics;  Laterality: Left;   LEFT HEART CATH AND CORONARY ANGIOGRAPHY N/A 07/12/2021   Procedure: LEFT HEART CATH AND CORONARY ANGIOGRAPHY;  Surgeon: Lucendia Rusk, MD;  Location: [facility] INVASIVE CV LAB;  Service: Cardiovascular;  Laterality: N/A;   LIGATION OF ARTERIOVENOUS  FISTULA Left 01/15/2024   Procedure: LIGATION OF ARTERIOVENOUS  FISTULA;  Surgeon: Adine Hoof, MD;  Location: [facility] OR;  Service: Vascular;  Laterality: Left;   PERIPHERAL VASCULAR BALLOON ANGIOPLASTY  11/30/2023   Procedure: PERIPHERAL VASCULAR BALLOON ANGIOPLASTY;  Surgeon: Adine Hoof, MD;  Location: [facility] INVASIVE CV LAB;  Service: Cardiovascular;;  left AT   PERIPHERAL VASCULAR INTERVENTION  11/30/2023   Procedure: PERIPHERAL VASCULAR INTERVENTION;  Surgeon: Adine Hoof, MD;  Location: [facility] INVASIVE CV LAB;  Service: Cardiovascular;;  stent left AT   POLYPECTOMY  07/24/2020   Procedure: POLYPECTOMY;  Surgeon: Albertina Hugger, MD;  Location: [facility] ENDOSCOPY;  Service: Gastroenterology;;   TEE WITHOUT CARDIOVERSION N/A 01/02/2020   Procedure: TRANSESOPHAGEAL ECHOCARDIOGRAM (TEE);  Surgeon: Euell Herrlich, MD;  Location: [facility] ENDOSCOPY;  Service: Cardiology;  Laterality: N/A;   TONSILLECTOMY     TUBAL LIGATION     UPPER EXTREMITY ANGIOGRAPHY Left 11/30/2023   Procedure: Upper Extremity Angiography;  Surgeon: Adine Hoof, MD;  Location: [facility] INVASIVE CV LAB;  Service: Cardiovascular;  Laterality: Left;   Family History  Family History  Problem Relation Age of Onset   Hypertension Mother    Diabetes Mother    Hypertension Sister    Social History  reports that she quit smoking about 40 years ago. Her smoking use included cigarettes. She started smoking about 44 years ago. She has never been exposed to tobacco smoke. She has never used smokeless tobacco. She reports that she does not drink alcohol and does  not use drugs. Allergies No Known Allergies Home medications Prior to Admission medications   Medication Sig Start Date End Date Taking? Authorizing Provider  acetaminophen (TYLENOL 8 HOUR) 650 MG CR tablet Take 1 tablet (650 mg total) by mouth every 6 (six) hours as needed for pain or fever. 01/04/24  Yes Deatra Face, MD  aspirin 81 MG chewable tablet Chew 1 tablet (81 mg total) by mouth daily. 07/13/21  Yes Ghimire, Estil Heman, MD  atorvastatin (LIPITOR) 80 MG tablet Take 80 mg by mouth at bedtime.   Yes [provider]  calcitRIOL (ROCALTROL) 0.5 MCG capsule Take 0.5 mcg by mouth daily.   Yes [provider]  cinacalcet (SENSIPAR) 30 MG tablet Take 30 mg by mouth daily. With snack   Yes [provider]  clopidogrel (PLAVIX) 75 MG tablet Take 1 tablet (75 mg total) by mouth daily. 01/06/24 01/05/25 Yes Rhyne, Samantha J, PA-C  gabapentin (NEURONTIN) 100 MG capsule Take 100 mg by mouth 2 (two) times daily. 11/02/23  Yes [provider]  Hyprom-Naphaz-Polysorb-Zn Sulf (CLEAR EYES COMPLETE OP) Place 1 drop into both eyes daily as needed (dry eyes).   Yes [provider]  Insulin Glargine Solostar (LANTUS) 100 UNIT/ML Solostar Pen Inject 20 Units into the skin daily. 11/11/22  Yes [provider]  linagliptin (TRADJENTA) 5 MG TABS tablet Take 1 tablet (5 mg total) by mouth daily. 06/26/23  Yes Thapa, [place], MD  magnesium gluconate (MAGONATE) 500 MG tablet Take 500 mg by mouth 2 (two) times a week.   Yes [provider]  oxyCODONE-acetaminophen (PERCOCET/ROXICET) 5-325 MG tablet Take 1 tablet by mouth every 6 (six) hours as needed. Patient taking differently: Take 1 tablet by mouth every 6 (six) hours as needed for moderate pain (pain score 4-6). 01/15/24  Yes Clinton Gallant M, PA-C  potassium chloride SA (KLOR-CON M) 20 MEQ tablet Take 20 mEq by mouth daily.   Yes [provider]  sevelamer carbonate (RENVELA) 800 MG tablet Take  800-3,200 mg by mouth See admin instructions. 3200 mg with meals and 800 mg with snacks   Yes [provider]  triamcinolone lotion (KENALOG) 0.1 % Apply 1 Application topically 2 (two) times daily as needed (irritation).   Yes [provider]  Insulin Pen Needle 32G X 4 MM MISC Use 4x a day 06/28/23   Thapa, [place], MD  isosorbide mononitrate (IMDUR) 30 MG 24 hr tablet Take 1 tablet (30 mg total) by mouth daily. Patient not taking: Reported on 02/09/2024 07/13/21   Maretta Bees, MD     Vitals:   02/09/24 1600 02/09/24 1615 02/09/24 1645 02/09/24 1715  BP: 102/62 (!) 92/48 (!) 95/59 110/64  Pulse: 85 86 76 79  Resp: 15 13 17 13   Temp:      TempSrc:      SpO2: 97% 97% 97% 100%  Weight:      Height:       Exam Gen alert, no distress, getting IVF's for low BP No rash, cyanosis or gangrene Sclera anicteric, throat clear  No jvd or bruits Chest clear bilat to bases, no rales/ wheezing RRR no MRG Abd soft ntnd no mass or ascites +bs GU defer MS no joint effusions or deformity Ext no LE or UE edema, no other edema Neuro is alert, Ox 3 , nf    PD cath in mid abdomen      Renal-related home meds: Sensipar 30 mg daily Klor-Con 20 meq daily Renvela 3.2gm AC 3 times daily    OP PD: CAPD Dr Marisue Humble, [facility] 7 days / week  3 exchanges per day, 8 hr dwell time, 3 L dwell volume   73.5kg dry wt  - rocaltrol 0.5 mcg po daily    Assessment/ Plan: Hypotension: suspected sepsis, getting IV abx, blood cx's sent. No abd pain and PD fluid clear this am per family. Per pmd.  ESRD: on CAPD. We don't do CAPD here. Pt is still in ED room so will miss PD tonight. Plan PD tomorrow night.  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 Cont binders w/ meals.       Vinson Moselle  MD [facility] 02/09/2024, 5:33 PM  Recent Labs  Lab 02/09/24 1151  HGB 16.0*   CALCIUM 9.8  CREATININE 8.69*  K 4.0   Inpatient medications:  aspirin  81 mg Oral Daily   atorvastatin  80 mg Oral QHS   calcitRIOL  0.5 mcg Oral Daily   cinacalcet  30 mg Oral Daily   clopidogrel  75 mg Oral Daily   gabapentin  100 mg Oral BID   heparin  5,000 Units Subcutaneous Q8H   [START  ON 02/10/2024] insulin aspart  0-6 Units Subcutaneous TID WC   midodrine  2.5 mg Oral TID WC   potassium chloride SA  20 mEq Oral Daily   [START ON 02/10/2024] sevelamer carbonate  3,200 mg Oral TID WC   sevelamer carbonate  800 mg Oral With snacks   vancomycin variable dose per unstable renal function (pharmacist dosing)   Does not apply See admin instructions    ceFEPime (MAXIPIME) IV Stopped (02/09/24 1419)   lactated ringers 150 mL/hr (02/09/24 1343)   metronidazole Stopped (02/09/24 1509)   acetaminophen, oxyCODONE-acetaminophen, polyvinyl alcohol

## 2024-02-09 NOTE — Assessment & Plan Note (Addendum)
 Echo 03/2023 showed normal LVEF (60-65%) with grade 2 diastolic dysfunction.  Not particularly volume up though weight is up from home baseline, lung fields clear on auscultation, BNP normal. Trop elevated likely in setting of demand/ESRD. -Continue to monitor, consider repeat echo as needed -Caution with continued fluids

## 2024-02-09 NOTE — ED Notes (Signed)
 6E Charge RN notified and agreed for pt  to be transported.

## 2024-02-09 NOTE — Progress Notes (Signed)
 ABG collected, results as follow PH 7.39 PC02 41.4 P02 79 HC03 25.5  Results are not transferring from the ISTAT to [facility].

## 2024-02-09 NOTE — Hospital Course (Addendum)
 Melissa Howe is a 73 y.o.female with a history of CHF, ESRD on PD, [name], DM2, HLD, HTN, Ischemic cardiomyopathy, and PVD who was admitted to the [facility] Medicine Teaching Service at [facility] for hypotension in the setting of nausea and vomiting.   Her hospital course is detailed below:  Hypotension Had continued lower pressures overnight.  However, patient remained asymptomatic throughout stay. By day 2, patient had returned to baseline and desire to leave the hospital.  Restarted Midodrine  in response to lower pressures, titrated to 10 mg TID. Blood pressures improved appreciably by discharge.   Sepsis infectious protocol In the ED, patient received Flagyl , cefepime , vancomycin  per sepsis protocol as well as 1 L IVF. Blood cultures drawn, which remained without growth.  (1 of 3 cultures grew Staph epidermidis, likely contaminant).  Discontinued antibiotics (4/15-4/17) with very low clinical suspicion for sepsis.  Remained afebrile throughout course.  No infectious symptomatology. Respiratory panel returned positive for rhinoenterovirus. Treated conservatively.   Nausea/emesis Over admission, patient noted new unsteadiness and nausea/emesis on rising from a seated position. Patient has previously been unsteady on arising -- but never to the point of being nauseous. Per chart review, this began after starting PD. Likely large orthostatic component in setting of chronic hypotension. However, previous workup has suggested vertigo component as well. Non-focal neurologic exam. Notes "room spinning" while standing and unsteadiness on feet even when anchored and standing. Will need diligent PO intake as well as outpatient evaluation for vertigo alongside home health PT.   Other chronic conditions were medically managed with home medications and formulary alternatives as necessary (T2DM, ESRD on PD 3x daily, CHF, HLD, [name], IDA, dry gangrene)  PCP Follow-up Recommendations: Follow up clinical symptoms of  hypotension Outpatient vertigo evaluation

## 2024-02-09 NOTE — Care Plan (Signed)
 FMTS Brief Progress Note  S: Patient seen at bedside.  She is doing well and without any concerns at this time.  Denies lightheadedness, dizziness, nausea.   O: BP 112/62   Pulse (!) 103   Temp 98.5 F (36.9 C) (Oral)   Resp (!) 23   Ht 5\' 4"  (1.626 m)   Wt 81.2 kg   SpO2 98%   BMI 30.73 kg/m   General: Well-appearing, lying in bed, no acute distress. HEENT: normocephalic, PERRLA, EOM grossly intact. Cardio: Regular rate, regular rhythm. Pulm: Clear, no wheezing, no crackles. No increased work of breathing on room air. Extremities: no peripheral edema. Neuro: Alert and oriented x3.  A/P: Hypotension Pressures stable, pt asymptomatic at this time. - continue Midodrine 2.5 mg TID with meals - continue timed LR IVF until tomorrow morning as ordered, then d/c IVF - CTM pressures overnight, favor increasing midodrine for worsening hypotension over additional IVF given pt CHF, ESRD.  Rhino/enterovirus+ Likely source of sepsis. Continue supportive care.  LA continues to downtrend. Most recent 2.5. - trend LA q3h for 2 more occurrences  - Orders reviewed. Labs for AM ordered, which was adjusted as needed.  - If condition changes, plan includes reevaulation.   Omar Bibber, DO 02/09/2024, 8:35 PM PGY-1, [facility] Night Resident  Please page [phone] with questions.

## 2024-02-09 NOTE — ED Notes (Signed)
 Daughter at bedside. Also reports pt was vomiting last night and not drinking anything since.

## 2024-02-09 NOTE — ED Provider Notes (Addendum)
 [place] EMERGENCY DEPARTMENT AT [facility] Provider Note   CSN: [id number] Arrival date & time: 02/09/24  1054     History  Chief Complaint  Patient presents with   Hypotension    Melissa Howe is a 73 y.o. female.  With history of hypertension, hyperlipidemia, coronary disease, NSTEMI, ischemic cardiomyopathy (last echo 03/2023 with normal function), end-stage renal disease on peritoneal dialysis.  Presented to the ED with concern for hypotension.  She has had similar presentation in back in January.  She reports that she is been feeling more fatigued, and does have shortness of breath.  She called EMS due to this concern.  She is currently doing peritoneal dialysis 3 times daily, reports no problem with the catheter.  Reports drainage is clear.  Last saw nephrology about a week ago.  She is unsure which physician prescribed midodrine, but she took it for 7 days and then ran out has been without for least a week.     Home Medications Prior to Admission medications   Medication Sig Start Date End Date Taking? Authorizing Provider  acetaminophen (TYLENOL 8 HOUR) 650 MG CR tablet Take 1 tablet (650 mg total) by mouth every 6 (six) hours as needed for pain or fever. 01/04/24   Derwood Kaplan, MD  aspirin 81 MG chewable tablet Chew 1 tablet (81 mg total) by mouth daily. 07/13/21   Ghimire, Werner Lean, MD  atorvastatin (LIPITOR) 80 MG tablet Take 80 mg by mouth at bedtime.    [provider]  calcitRIOL (ROCALTROL) 0.5 MCG capsule Take 0.5 mcg by mouth daily.    [provider]  cinacalcet (SENSIPAR) 30 MG tablet Take 30 mg by mouth daily. With snack    [provider]  clopidogrel (PLAVIX) 75 MG tablet Take 1 tablet (75 mg total) by mouth daily. 01/06/24 01/05/25  Rhyne, Ames Coupe, PA-C  gabapentin (NEURONTIN) 100 MG capsule Take 100 mg by mouth 2 (two) times daily. 11/02/23   [provider]  Hyprom-Naphaz-Polysorb-Zn Sulf (CLEAR EYES  COMPLETE OP) Place 1 drop into both eyes daily as needed (dry eyes).    [provider]  Insulin Glargine Solostar (LANTUS) 100 UNIT/ML Solostar Pen Inject 20 Units into the skin daily. 11/11/22   [provider]  Insulin Pen Needle 32G X 4 MM MISC Use 4x a day 06/28/23   Thapa, [place], MD  isosorbide mononitrate (IMDUR) 30 MG 24 hr tablet Take 1 tablet (30 mg total) by mouth daily. 07/13/21   Ghimire, Werner Lean, MD  linagliptin (TRADJENTA) 5 MG TABS tablet Take 1 tablet (5 mg total) by mouth daily. 06/26/23   Thapa, [place], MD  magnesium gluconate (MAGONATE) 500 MG tablet Take 500 mg by mouth 2 (two) times a week.    [provider]  oxyCODONE-acetaminophen (PERCOCET/ROXICET) 5-325 MG tablet Take 1 tablet by mouth every 6 (six) hours as needed. 01/15/24   Lars Mage, PA-C  potassium chloride SA (KLOR-CON M) 20 MEQ tablet Take 20 mEq by mouth daily.    [provider]  sevelamer carbonate (RENVELA) 800 MG tablet Take 800-3,200 mg by mouth See admin instructions. 3200 mg with meals and 800 mg with snacks    [provider]  triamcinolone lotion (KENALOG) 0.1 % Apply 1 Application topically 2 (two) times daily as needed (irritation).    [provider]      Allergies    Patient has no known allergies.    Review of Systems  Review of Systems  Physical Exam Updated Vital Signs BP (!) 112/35 (BP Location: Right Wrist)   Pulse 93   Temp 97.8 F (36.6 C) (Oral)   Resp 15   Ht 5\' 4"  (1.626 m)   Wt 81.2 kg   SpO2 100%   BMI 30.73 kg/m  Physical Exam Constitutional:      General: She is not in acute distress.    Appearance: She is ill-appearing.  HENT:     Head: Normocephalic and atraumatic.  Eyes:     Extraocular Movements: Extraocular movements intact.     Conjunctiva/sclera: Conjunctivae normal.     Pupils: Pupils are equal, round, and reactive to light.  Cardiovascular:     Rate and Rhythm: Normal rate and regular rhythm.      Pulses: Normal pulses.     Heart sounds: Normal heart sounds.  Pulmonary:     Effort: Pulmonary effort is normal.     Breath sounds: Normal breath sounds.  Abdominal:     General: Abdomen is flat. Bowel sounds are normal.     Palpations: Abdomen is soft.  Musculoskeletal:     Cervical back: Normal range of motion.     Right lower leg: Edema (trace) present.     Left lower leg: Edema (trace) present.  Skin:    General: Skin is warm and dry.     Capillary Refill: Capillary refill takes 2 to 3 seconds.  Neurological:     General: No focal deficit present.     Mental Status: She is alert and oriented to person, place, and time.     ED Results / Procedures / Treatments   Labs (all labs ordered are listed, but only abnormal results are displayed) Labs Reviewed  BASIC METABOLIC PANEL WITH GFR - Abnormal; Notable for the following components:      Result Value   Sodium 134 (*)    Chloride 93 (*)    Glucose, Bld 304 (*)    BUN 26 (*)    Creatinine, Ser 8.69 (*)    GFR, Estimated 4 (*)    All other components within normal limits  CBC - Abnormal; Notable for the following components:   WBC 20.5 (*)    RBC 5.35 (*)    Hemoglobin 16.0 (*)    HCT 51.3 (*)    All other components within normal limits  I-STAT CG4 LACTIC ACID, ED - Abnormal; Notable for the following components:   Lactic Acid, Venous 3.9 (*)    All other components within normal limits  CULTURE, BLOOD (ROUTINE X 2)  CULTURE, BLOOD (ROUTINE X 2)  PROTIME-INR  APTT  BRAIN NATRIURETIC PEPTIDE  I-STAT CG4 LACTIC ACID, ED  TROPONIN I (HIGH SENSITIVITY)  TROPONIN I (HIGH SENSITIVITY)    EKG EKG Interpretation Date/Time:  Tuesday February 09 2024 11:17:14 EDT Ventricular Rate:  99 PR Interval:  157 QRS Duration:  92 QT Interval:  343 QTC Calculation: 441 R Axis:   47  Text Interpretation: Sinus rhythm Nonspecific T abnormalities, lateral leads Confirmed by Clay Cummins [phone]) on 02/09/2024 12:53:39  PM  Radiology No results found.  Procedures Procedures    Medications Ordered in ED Medications  lactated ringers infusion (150 mL/hr Intravenous New Bag/Given 02/09/24 1343)  lactated ringers bolus 500 mL (500 mLs Intravenous New Bag/Given 02/09/24 1343)  metroNIDAZOLE (FLAGYL) IVPB 500 mg (500 mg Intravenous New Bag/Given 02/09/24 1419)  vancomycin (VANCOREADY) IVPB 1750 mg/350 mL (has no administration in time range)  ceFEPIme (MAXIPIME) 1  g in sodium chloride 0.9 % 100 mL IVPB (0 g Intravenous Stopped 02/09/24 1419)  vancomycin variable dose per unstable renal function (pharmacist dosing) (has no administration in time range)  lactated ringers bolus 500 mL (has no administration in time range)    ED Course/ Medical Decision Making/ A&P                                 Medical Decision Making 73 year old female (see above for past medical history) presenting with fatigue, shortness of breath, increased cough found to be hypotensive.  Initial lab work in triage including CBC was remarkable for leukocytosis, BMP (consistent with ESRD no emergent findings) also noted to be tachycardic HR >90.  Met SIRS criteria, code sepsis called.  At this time, unclear source.  Potential sources include: Respiratory, intra-abdominal, skin flora (dry gangrene of left pinky finger). Will obtain lab work, CXR, nephrology consult for peritoneal fluid sample. Additional differentials for hypotension include: ACS, heart failure, ESRD, dehydration.  Obtain BNP, troponins.  Reassuringly no evidence of anaphylaxis nor hemorrhage.  Spoke with nephrology.  Given history, they have lower concern for peritonitis however they will see patient.  They may not be able to perform a tap until this evening.  Independently reviewed EKG: Normal sinus, no ischemic changes.  Lactic acid returned, 3.9-given additional 500 mL bolus.  Does meet severe sepsis criteria with systolic less than 90 and lactic acidosis. Independently  reviewed CXR: Mild pulmonary vascular congestion, no evidence of pneumonia.  Pending official radiology read. Blood pressure remains soft, however maps are greater than 60-65.  At this point in time I do believe patient will need admission for continued IV antibiotics, further workup and resumed peritoneal dialysis.  Nephrology was consulted, will see tonight.  Patient is stable for progressive floor, paged for unassigned admission.  BP responding to fluid bolus, do not believe patient is in shock.  Still pending troponin, if elevated would recommend trending given ESRD and extensive cardiac history. Would consider repeat Echo inpatient.  Spoke with Dr. Fredrik Jensen FMTS. They will admit patient.   Amount and/or Complexity of Data Reviewed Labs: ordered. Radiology: ordered.  Risk Prescription drug management. Decision regarding hospitalization.         Final Clinical Impression(s) / ED Diagnoses Final diagnoses:  None    Rx / DC Orders ED Discharge Orders     None         Lavada Porteous, DO 02/09/24 1437    Lavada Porteous, DO 02/09/24 1441    Clay Cummins, MD 02/09/24 1544

## 2024-02-09 NOTE — ED Notes (Signed)
 Family med admitting team made aware of pts BP.

## 2024-02-09 NOTE — Assessment & Plan Note (Addendum)
 Peritoneal dialysis.  3 times daily at home.  Site clean and dry.  No abdominal tenderness on palpation.  Unlikely infectious source -- patient appears well. - Appreciate nephrology recommendations - Continue peritoneal dialysis in the hospital - AM renal function panels.

## 2024-02-10 ENCOUNTER — Ambulatory Visit: Attending: Nurse Practitioner

## 2024-02-10 DIAGNOSIS — Z992 Dependence on renal dialysis: Secondary | ICD-10-CM | POA: Diagnosis not present

## 2024-02-10 DIAGNOSIS — N186 End stage renal disease: Secondary | ICD-10-CM | POA: Diagnosis not present

## 2024-02-10 DIAGNOSIS — Z01810 Encounter for preprocedural cardiovascular examination: Secondary | ICD-10-CM

## 2024-02-10 DIAGNOSIS — I959 Hypotension, unspecified: Secondary | ICD-10-CM | POA: Diagnosis not present

## 2024-02-10 LAB — LIPID PANEL
Cholesterol: 99 mg/dL (ref 0–200)
HDL: 23 mg/dL — ABNORMAL LOW (ref >40)
LDL Cholesterol: 55 mg/dL (ref 0–99)
Total CHOL/HDL Ratio: 4.3 ratio
Triglycerides: 105 mg/dL (ref <150)
VLDL: 21 mg/dL (ref 0–40)

## 2024-02-10 LAB — BLOOD CULTURE ID PANEL (REFLEXED) - BCID2

## 2024-02-10 LAB — GLUCOSE, CAPILLARY
Glucose-Capillary: 106 mg/dL — ABNORMAL HIGH (ref 70–99)
Glucose-Capillary: 101 mg/dL — ABNORMAL HIGH (ref 70–99)
Glucose-Capillary: 97 mg/dL (ref 70–99)
Glucose-Capillary: 140 mg/dL — ABNORMAL HIGH (ref 70–99)

## 2024-02-10 LAB — CBC
HCT: 41 % (ref 36.0–46.0)
Hemoglobin: 13 g/dL (ref 12.0–15.0)
MCH: 30.3 pg (ref 26.0–34.0)
MCHC: 31.7 g/dL (ref 30.0–36.0)
MCV: 95.6 fL (ref 80.0–100.0)
Platelets: 232 10*3/uL (ref 150–400)
RBC: 4.29 MIL/uL (ref 3.87–5.11)
RDW: 14.4 % (ref 11.5–15.5)
WBC: 15.6 10*3/uL — ABNORMAL HIGH (ref 4.0–10.5)
nRBC: 0.2 % (ref 0.0–0.2)

## 2024-02-10 LAB — BASIC METABOLIC PANEL WITH GFR
Anion gap: 9 (ref 5–15)
BUN: 29 mg/dL — ABNORMAL HIGH (ref 8–23)
CO2: 25 mmol/L (ref 22–32)
Calcium: 8.2 mg/dL — ABNORMAL LOW (ref 8.9–10.3)
Chloride: 101 mmol/L (ref 98–111)
Creatinine, Ser: 8.48 mg/dL — ABNORMAL HIGH (ref 0.44–1.00)
GFR, Estimated: 5 mL/min — ABNORMAL LOW (ref >60)
Glucose, Bld: 70 mg/dL (ref 70–99)
Potassium: 3.8 mmol/L (ref 3.5–5.1)
Sodium: 135 mmol/L (ref 135–145)

## 2024-02-10 LAB — MAGNESIUM: Magnesium: 1.5 mg/dL — ABNORMAL LOW (ref 1.7–2.4)

## 2024-02-10 LAB — HEMOGLOBIN A1C
Hgb A1c MFr Bld: 7.7 % — ABNORMAL HIGH (ref 4.8–5.6)
Mean Plasma Glucose: 174.29 mg/dL

## 2024-02-10 MED ORDER — ONDANSETRON HCL 4 MG/2ML IJ SOLN
4.0000 mg | Freq: Three times a day (TID) | INTRAMUSCULAR | Status: DC | PRN
  Administered 2024-02-10 – 2024-02-12 (×5): 4 mg via INTRAVENOUS
  Filled 2024-02-10 (×6): qty 2

## 2024-02-10 MED ORDER — MAGNESIUM SULFATE IN D5W 1-5 GM/100ML-% IV SOLN
1.0000 g | Freq: Once | INTRAVENOUS | Status: AC
  Administered 2024-02-10: 1 g via INTRAVENOUS
  Filled 2024-02-10: qty 100

## 2024-02-10 MED ORDER — GENTAMICIN SULFATE 0.1 % EX CREA
1.0000 | TOPICAL_CREAM | Freq: Every day | CUTANEOUS | Status: DC
  Administered 2024-02-11: 1 via TOPICAL
  Filled 2024-02-10: qty 15

## 2024-02-10 MED ORDER — MIDODRINE HCL 5 MG PO TABS
5.0000 mg | ORAL_TABLET | Freq: Three times a day (TID) | ORAL | Status: DC
  Administered 2024-02-10 – 2024-02-11 (×4): 5 mg via ORAL
  Filled 2024-02-10 (×3): qty 1

## 2024-02-10 MED ORDER — ORAL CARE MOUTH RINSE: 15.0000 mL | OROMUCOSAL | Status: DC | PRN

## 2024-02-10 MED ORDER — ONDANSETRON 4 MG PO TBDP: 4.0000 mg | ORAL_TABLET | Freq: Three times a day (TID) | ORAL | Status: DC | PRN

## 2024-02-10 NOTE — Telephone Encounter (Signed)
 Pt called in to r/s her tele visit today

## 2024-02-10 NOTE — Progress Notes (Signed)
 Patient currently admitted and unable to complete televisit.  I will enter no charge for today's visit.

## 2024-02-10 NOTE — Progress Notes (Signed)
 [facility] Progress Note   Subjective:     Objective Vitals:   02/09/24 2227 02/09/24 2230 02/10/24 0541 02/10/24 0754  BP:  (!) 94/46 (!) 91/37 (!) 104/42  Pulse:  79 72   Resp:  (!) 21  17  Temp:  98.4 F (36.9 C) 97.9 F (36.6 C) 97.9 F (36.6 C)  TempSrc:  Oral Oral Oral  SpO2:  100% 100%   Weight: 72.2 kg     Height: 5\' 4"  (1.626 m)      Physical Exam General: Heart: Lungs: Abdomen: Extremities: Dialysis Access:   Dialysis Orders:  Additional Objective Labs: Basic Metabolic Panel: Recent Labs  Lab 02/09/24 1151 02/10/24 0452  NA 134* 135  K 4.0 3.8  CL 93* 101  CO2 27 25  GLUCOSE 304* 70  BUN 26* 29*  CREATININE 8.69* 8.48*  CALCIUM 9.8 8.2*   Liver Function Tests: No results for input(s): "AST", "ALT", "ALKPHOS", "BILITOT", "PROT", "ALBUMIN" in the last 168 hours. No results for input(s): "LIPASE", "AMYLASE" in the last 168 hours. CBC: Recent Labs  Lab 02/09/24 1151 02/10/24 0452  WBC 20.5* 15.6*  HGB 16.0* 13.0  HCT 51.3* 41.0  MCV 95.9 95.6  PLT 300 232   Blood Culture    Component Value Date/Time   SDES BLOOD RIGHT ANTECUBITAL 02/09/2024 1343   SPECREQUEST  02/09/2024 1343    BOTTLES DRAWN AEROBIC AND ANAEROBIC Blood Culture results may not be optimal due to an inadequate volume of blood received in culture bottles   CULT  02/09/2024 1343    NO GROWTH < 24 HOURS Performed at [facility], [address], [place], [place] [address]    REPTSTATUS PENDING 02/09/2024 1343    Cardiac Enzymes: No results for input(s): "CKTOTAL", "CKMB", "CKMBINDEX", "TROPONINI" in the last 168 hours. CBG: Recent Labs  Lab 02/09/24 1847 02/10/24 0753 02/10/24 1206  GLUCAP 184* 106* 101*   Iron Studies: No results for input(s): "IRON", "TIBC", "TRANSFERRIN", "FERRITIN" in the last 72 hours. @lablastinr3 @ Studies/Results: DG Chest Port 1 View Result Date: 02/09/2024 CLINICAL DATA:  Shortness of breath EXAM: PORTABLE CHEST -  1 VIEW COMPARISON:  11/26/2023 FINDINGS: Low lung volumes with crowding of perihilar bronchovascular markings. No confluent airspace disease. Heart size and mediastinal contours are within normal limits. No effusion. Visualized bones unremarkable. IMPRESSION: Low volumes. No acute findings. Electronically Signed   By: Nicoletta Barrier M.D.   On: 02/09/2024 15:46   Medications:  ceFEPime (MAXIPIME) IV 1 g (02/10/24 1305)   metronidazole 500 mg (02/10/24 1429)    aspirin  81 mg Oral Daily   atorvastatin  80 mg Oral QHS   calcitRIOL  0.5 mcg Oral Daily   cinacalcet  30 mg Oral Daily   clopidogrel  75 mg Oral Daily   gabapentin  100 mg Oral BID   gentamicin cream  1 Application Topical Daily   heparin  5,000 Units Subcutaneous Q8H   insulin aspart  0-6 Units Subcutaneous TID WC   midodrine  5 mg Oral TID WC   potassium chloride SA  20 mEq Oral Daily   sevelamer carbonate  3,200 mg Oral TID WC   sevelamer carbonate  800 mg Oral With snacks   vancomycin variable dose per unstable renal function (pharmacist dosing)   Does not apply See admin instructions     OP PD: CAPD Dr Jearldine Mina, [facility] 7 days / week  3 exchanges per day, 8 hr dwell time, 3 L dwell volume  73.5kg dry wt  - rocaltrol 0.5 mcg po daily       Assessment/ Plan: Hypotension: Has rhino/enterovirus. Cause of hypotension likely dehydration. Increased midodrine. Per primary Possible sepsis-W/U per primary. BC NGTD < 24 hours.  ESRD: on CAPD. Missed HD last night D/T being in ED. CCPD ordered for night. All 1.5% fills.  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 Cont binders w/ meals.   Gerrod Maule H. Navjot Loera NP-C 02/10/2024, 2:37 PM  [employer] [phone]

## 2024-02-10 NOTE — Assessment & Plan Note (Signed)
 Hyperlipidemia: Continue Lipitor 80 mg nightly, continue home sevelamer.  Lipid panel unremarkable. Coronary artery disease: Continue DAPT (aspirin, Plavix)

## 2024-02-10 NOTE — Assessment & Plan Note (Addendum)
 BNP 52.7.  Does not appear volume up on exam.  Hold repeat echo at this time. -Continue to monitor, consider repeat echo as needed -Caution with continued fluids

## 2024-02-10 NOTE — Assessment & Plan Note (Signed)
 Chronic, follows with vascular surgery.  On fifth digit of left hand.  Appears dry.  No erythema, purulent fluid, pain. - Consider VVS consult over admission if worsening; otherwise, outpatient follow-up as scheduled for amputation

## 2024-02-10 NOTE — Assessment & Plan Note (Signed)
 Hemoglobin 13.0 within normal limits. - AM CBCs as above

## 2024-02-10 NOTE — Assessment & Plan Note (Addendum)
 Respiratory panel positive for rhino/enterovirus.  Likely etiology of patient's nausea and poor p.o. intake, exacerbating hypotension.  Continues to be hypotensive MAPs 55-59 despite fluid resuscitation. Lactic acid now WNL. - Appreciate nephrology consult - Increase midodrine  2.5 mg 3 times daily ? 5 mg 3 times daily. - Discontinue IV LR as patient is eating well - Continue sepsis antibiotic protocol of cefepime, Flagyl and vancomycin (4/15 -).  If blood cultures remained negative at 24 hours, will pare down. - Blood cultures negative at <24 hours - Labs: A.m. renal function panel, a.m. CBC - Strict I/Os - Daily weights - Fall precautions

## 2024-02-10 NOTE — Telephone Encounter (Signed)
 I s/w the pt and she tells me that she is currently admitted to the hospital, though not in relation the to surgery she is planned to have with Kuzma.   I advised the pt to call our office once she is d/c from the hospital as then we will schedule tele preop appt.    I will update the surgeon's office as well as not knowing when pt will be d/c and she needs a tele visit for preop clearance, not sure if her procedure will need to be pushed out. We will await a call from the pt call back to schedule tele preop appt. I will remove from the preop call back pool at time until the pt calls back.

## 2024-02-10 NOTE — Progress Notes (Signed)
 PHARMACY - PHYSICIAN COMMUNICATION CRITICAL VALUE ALERT - BLOOD CULTURE IDENTIFICATION (BCID)  Melissa Howe is an 73 y.o. female who presented to [facility] on [facility]/09/2024 with a chief complaint of hypotension, r/o sepsis  Assessment:  1/3 bottles growing staph epi - likely contaminant  Name of physician (or Provider) Contacted: Dr. Irby Mannan  Current antibiotics: Vancomycin, Cefepime, and Flagyl  Changes to prescribed antibiotics recommended:  No additional abx needed  Results for orders placed or performed during the hospital encounter of 02/09/24  Blood Culture ID Panel (Reflexed) (Collected: 02/09/2024  1:35 PM)  Result Value Ref Range   Enterococcus faecalis NOT DETECTED NOT DETECTED   Enterococcus Faecium NOT DETECTED NOT DETECTED   Listeria monocytogenes NOT DETECTED NOT DETECTED   Staphylococcus species DETECTED (A) NOT DETECTED   Staphylococcus aureus (BCID) NOT DETECTED NOT DETECTED   Staphylococcus epidermidis DETECTED (A) NOT DETECTED   Staphylococcus lugdunensis NOT DETECTED NOT DETECTED   Streptococcus species NOT DETECTED NOT DETECTED   Streptococcus agalactiae NOT DETECTED NOT DETECTED   Streptococcus pneumoniae NOT DETECTED NOT DETECTED   Streptococcus pyogenes NOT DETECTED NOT DETECTED   A.calcoaceticus-baumannii NOT DETECTED NOT DETECTED   Bacteroides fragilis NOT DETECTED NOT DETECTED   Enterobacterales NOT DETECTED NOT DETECTED   Enterobacter cloacae complex NOT DETECTED NOT DETECTED   Escherichia coli NOT DETECTED NOT DETECTED   Klebsiella aerogenes NOT DETECTED NOT DETECTED   Klebsiella oxytoca NOT DETECTED NOT DETECTED   Klebsiella pneumoniae NOT DETECTED NOT DETECTED   Proteus species NOT DETECTED NOT DETECTED   Salmonella species NOT DETECTED NOT DETECTED   Serratia marcescens NOT DETECTED NOT DETECTED   Haemophilus influenzae NOT DETECTED NOT DETECTED   Neisseria meningitidis NOT DETECTED NOT DETECTED   Pseudomonas aeruginosa NOT DETECTED NOT  DETECTED   Stenotrophomonas maltophilia NOT DETECTED NOT DETECTED   Candida albicans NOT DETECTED NOT DETECTED   Candida auris NOT DETECTED NOT DETECTED   Candida glabrata NOT DETECTED NOT DETECTED   Candida krusei NOT DETECTED NOT DETECTED   Candida parapsilosis NOT DETECTED NOT DETECTED   Candida tropicalis NOT DETECTED NOT DETECTED   Cryptococcus neoformans/gattii NOT DETECTED NOT DETECTED   Methicillin resistance mecA/C NOT DETECTED NOT DETECTED    Melissa Howe, PharmD, BCPS Please see amion for complete clinical pharmacist phone list 02/10/2024  4:57 PM

## 2024-02-10 NOTE — Plan of Care (Signed)

## 2024-02-10 NOTE — Assessment & Plan Note (Addendum)
 Scheduled for p.m. of 4/16, per nephrology.  No issues. - Appreciate nephrology recommendations - AM RFP's

## 2024-02-10 NOTE — Assessment & Plan Note (Addendum)
 1.5 this a.m. - S/p 1 g IV mag - Labs: Repeat a.m. mag

## 2024-02-10 NOTE — Assessment & Plan Note (Signed)
 A1c 7.7.  Lipids well-controlled.  BHB negative. ABG unremarkable. DKA ruled out. - Continue sensitive sliding scale insulin  - Continue to hold home linagliptin, insulin glargine for now

## 2024-02-10 NOTE — Progress Notes (Signed)
   02/10/24 1845  Cycler Setup  Total Number of Night Cycles 4  Night Fill Volume 2500  Dianeal Solution Dextrose 1.5% in 6000 mL Low Cal/Low Mag  Dianeal Additive Heparin  Night Dwell Time per Cycle - Hour(s) 2  Night Time Therapy - Minute(s) 23  Night Time Therapy - Hour(s) 10  Minimum Initial Drain Volume 0  Maximum Peritoneal Volume 3750  Night/Total Therapy Volume 52841  Completion  Treatment Status Started   PD tx initation note:   Pre TX VS: 143/55 80 20  Pre TX weight: 72.2kg  PD treatment initiated via aseptic technique. Consent signed and in chart. Patient is alert and oriented. No complaints of pain. No specimen collected. PD exit site clean, dry and intact. Gentamycin and new dressing applied. Bedside RN educated on PD machine and how to contact tech support when PD machine alarms.

## 2024-02-10 NOTE — Progress Notes (Signed)
 Daily Progress Note Intern Pager: [phone]  Patient name: Melissa Howe Medical record number: [id number] Date of birth: [birth year] Age: 73 y.o. 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 intake and emesis.  Respiratory panel positive for enterovirus.  Likely etiology of worsening p.o. intake and therefore hypotension.  Will increase midodrine.  Awaiting blood cultures, we will discontinue code sepsis antibiotics when those returned negative.  Appreciate nephrology recommendations. Assessment & Plan Hypotension Respiratory panel positive for rhino/enterovirus.  Likely etiology of patient's nausea and poor p.o. intake, exacerbating hypotension.  Continues to be hypotensive MAPs 55-59 despite fluid resuscitation. Lactic acid now WNL. - Appreciate nephrology consult - Increase midodrine  2.5 mg 3 times daily ? 5 mg 3 times daily. - Discontinue IV LR as patient is eating well - Continue sepsis antibiotic protocol of cefepime, Flagyl and vancomycin (4/15 -).  If blood cultures remained negative at 24 hours, will pare down. - Blood cultures negative at <24 hours - Labs: A.m. renal function panel, a.m. CBC - Strict I/Os - Daily weights - Fall precautions IDA (iron deficiency anemia) Hemoglobin 13.0 within normal limits. - AM CBCs as above T2DM (type 2 diabetes mellitus) (HCC) A1c 7.7.  Lipids well-controlled.  BHB negative. ABG unremarkable. DKA ruled out. - Continue sensitive sliding scale insulin  - Continue to hold home linagliptin, insulin glargine for now ESRD on peritoneal dialysis (HCC) Scheduled for p.m. of 4/16, per nephrology.  No issues. - Appreciate nephrology recommendations - AM RFP's Hypomagnesemia 1.5 this a.m. - S/p 1 g IV  mag - Labs: Repeat a.m. mag Dry gangrene (HCC) Chronic, follows with vascular surgery.  On fifth digit of left hand.  Appears dry.  No erythema, purulent fluid, pain. - Consider VVS consult over admission if worsening; otherwise, outpatient follow-up as scheduled for amputation CHF (congestive heart failure) (HCC) BNP 52.7.  Does not appear volume up on exam.  Hold repeat echo at this time. -Continue to monitor, consider repeat echo as needed -Caution with continued fluids Chronic health problem Hyperlipidemia: Continue Lipitor 80 mg nightly, continue home sevelamer.  Lipid panel unremarkable. Coronary artery disease: Continue DAPT (aspirin, Plavix)  FEN/GI: Carb modified with fluid restriction 1.2 L PPx: Heparin Dispo: Home, barriers include resolution of hypotension.  Subjective:   On interview, patient feels "back to normal".  Denies cough.  About the breakfast.  No nausea or vomiting.  Denies abdominal pain.  No fevers or chills.  Daughter in room.  Patient pleasant, alert and oriented.  Asked if she could keep her remote cardiology appointment.  Objective:  BP: 104/42 HR: 72 RR: 217 T: 97.9 O2sat: 99% on room air  Significant vitals over past 24 hours:   Physical Exam:  General: Well-appearing older female, NAD, attentive. Cardiovascular: RRR no m/r/g. Respiratory: CTAB. No w/r/r.  Abdomen: No tenderness in 4 quadrants. BS+.  Extremities: ROM intact.  Nonedematous bilateral lower extremities.  Basic labs:  Most recent CBC Lab Results  Component Value Date   WBC 15.6 (H) 02/10/2024   HGB 13.0 02/10/2024   HCT 41.0 02/10/2024   MCV 95.6 02/10/2024   PLT 232 02/10/2024   Most recent BMP    Latest Ref Rng & Units 02/10/2024    4:52 AM  BMP  Glucose 70 -  99 mg/dL 70   BUN 8 - 23 mg/dL 29   Creatinine 4.09 - 1.00 mg/dL 8.11   Sodium 914 - 782 mmol/L 135   Potassium 3.5 - 5.1 mmol/L 3.8   Chloride 98 - 111 mmol/L 101   CO2 22 - 32 mmol/L 25   Calcium 8.9 - 10.3  mg/dL 8.2     Other pertinent labs:  Respiratory panel: Positive for enterovirus BNP: 52.7 Lactic acid 1.9 CBGs: 184 A1c: 7.7% LDL: 55 Magnesium: 1.5 Beta hydroxybutyrate acid 0.05 WNL   Imaging/Diagnostic Tests:  No new imaging.  Gwyndolyn Lerner, MD 02/10/2024, 7:30 AM  PGY-1, [facility] FPTS Intern pager: [phone], text pages welcome Secure chat group [facility] Teaching Service

## 2024-02-11 DIAGNOSIS — Z992 Dependence on renal dialysis: Secondary | ICD-10-CM | POA: Diagnosis not present

## 2024-02-11 DIAGNOSIS — E1142 Type 2 diabetes mellitus with diabetic polyneuropathy: Secondary | ICD-10-CM | POA: Diagnosis not present

## 2024-02-11 DIAGNOSIS — N186 End stage renal disease: Secondary | ICD-10-CM | POA: Diagnosis not present

## 2024-02-11 DIAGNOSIS — I9589 Other hypotension: Secondary | ICD-10-CM

## 2024-02-11 LAB — CULTURE, BLOOD (ROUTINE X 2)

## 2024-02-11 LAB — RENAL FUNCTION PANEL
Albumin: 1.8 g/dL — ABNORMAL LOW (ref 3.5–5.0)
Anion gap: 11 (ref 5–15)
BUN: 31 mg/dL — ABNORMAL HIGH (ref 8–23)
CO2: 23 mmol/L (ref 22–32)
Calcium: 8.4 mg/dL — ABNORMAL LOW (ref 8.9–10.3)
Chloride: 99 mmol/L (ref 98–111)
Creatinine, Ser: 8.13 mg/dL — ABNORMAL HIGH (ref 0.44–1.00)
GFR, Estimated: 5 mL/min — ABNORMAL LOW (ref >60)
Glucose, Bld: 166 mg/dL — ABNORMAL HIGH (ref 70–99)
Phosphorus: 2.7 mg/dL (ref 2.5–4.6)
Potassium: 3.7 mmol/L (ref 3.5–5.1)
Sodium: 133 mmol/L — ABNORMAL LOW (ref 135–145)

## 2024-02-11 LAB — CBC
HCT: 40.9 % (ref 36.0–46.0)
Hemoglobin: 13.2 g/dL (ref 12.0–15.0)
MCH: 30.2 pg (ref 26.0–34.0)
MCHC: 32.3 g/dL (ref 30.0–36.0)
MCV: 93.6 fL (ref 80.0–100.0)
Platelets: 241 10*3/uL (ref 150–400)
RBC: 4.37 MIL/uL (ref 3.87–5.11)
RDW: 14.3 % (ref 11.5–15.5)
WBC: 13.2 10*3/uL — ABNORMAL HIGH (ref 4.0–10.5)
nRBC: 0 % (ref 0.0–0.2)

## 2024-02-11 LAB — GLUCOSE, CAPILLARY
Glucose-Capillary: 150 mg/dL — ABNORMAL HIGH (ref 70–99)
Glucose-Capillary: 154 mg/dL — ABNORMAL HIGH (ref 70–99)
Glucose-Capillary: 100 mg/dL — ABNORMAL HIGH (ref 70–99)
Glucose-Capillary: 193 mg/dL — ABNORMAL HIGH (ref 70–99)

## 2024-02-11 LAB — MAGNESIUM: Magnesium: 1.5 mg/dL — ABNORMAL LOW (ref 1.7–2.4)

## 2024-02-11 MED ORDER — SODIUM CHLORIDE 0.9 % IV BOLUS
1000.0000 mL | Freq: Once | INTRAVENOUS | Status: AC
  Administered 2024-02-11: 1000 mL via INTRAVENOUS

## 2024-02-11 MED ORDER — MIDODRINE HCL 5 MG PO TABS
10.0000 mg | ORAL_TABLET | Freq: Three times a day (TID) | ORAL | Status: DC
  Administered 2024-02-11 – 2024-02-12 (×4): 10 mg via ORAL
  Filled 2024-02-11 (×4): qty 2

## 2024-02-11 MED ORDER — FERRIC CITRATE 1 GM 210 MG(FE) PO TABS
210.0000 mg | ORAL_TABLET | Freq: Two times a day (BID) | ORAL | Status: DC
Start: 2024-02-11 — End: 2024-02-13
  Administered 2024-02-11 – 2024-02-12 (×3): 210 mg via ORAL
  Filled 2024-02-11 (×4): qty 1

## 2024-02-11 MED ORDER — GENTAMICIN SULFATE 0.1 % EX CREA
1.0000 | TOPICAL_CREAM | Freq: Every day | CUTANEOUS | Status: DC
  Administered 2024-02-11: 1 via TOPICAL
  Filled 2024-02-11: qty 15

## 2024-02-11 MED ORDER — MAGNESIUM SULFATE 2 GM/50ML IV SOLN
2.0000 g | Freq: Once | INTRAVENOUS | Status: AC
  Administered 2024-02-11: 2 g via INTRAVENOUS
  Filled 2024-02-11: qty 50

## 2024-02-11 NOTE — Assessment & Plan Note (Signed)
 Continues to look well.  - Consider VVS consult over admission if worsening; otherwise, outpatient follow-up as scheduled for amputation

## 2024-02-11 NOTE — Progress Notes (Addendum)
 Daily Progress Note Intern Pager: [phone]  Patient name: Melissa Howe Medical record number: [id number] Date of birth: 01/20/1951 Age: 73 y.o. Gender: female  Primary Care Provider: Raymon Mutton., FNP Consultants: Nephrology Code Status: Full  Pt Overview and Major Events to Date:   Melissa Howe is a 73 year old female with a past medical history of ESRD on peritoneal dialysis and chronic hypotension who presents with worsened hypotension in the setting of rhino/enterovirus. Asymptomatic this morning. Increasing Midodrine 5mg  --> 10mg  TID for very low pressures. Would like to leave the hospital if possible. Discontinuing code sepsis ABX with BCx showing NGTD @24h . Patient has never shown any clinical indication for sepsis. Previous nausea, vomiting and cough explained by rhino/enterovirus, found on respiratory panel. Will continue to monitor blood cultures and clinical status and add back if necessary.  Assessment & Plan Hypotension Very low pressures overnight to 84/30. Now somewhat improved. Asymptomatic this morning. Eating well, although dislikes the food. No dizziness. Did cough up a "large pill" earlier yesterday evening. Underwent peritoneal dialysis without incident. BCx 1/3 returned S.epidermitis, likely contaminant per pharmacy.  - Appreciate nephrology consult - Increase midodrine 5 mg 3 times daily --> 10 mg 3 times daily - Discontinue code sepsis IV antibiotics -- very low concern for bacteremia. Blood cultures negative at 24 hours. Afebrile throughout course. Infectious sources (peritoneal dialysis, gangrenous fifth L digit) have shown no pain, erythema. No abdominal pain or soreness.  - PT/OT consult place, appreciate recs - Labs: A.m. renal function panel, a.m. CBC - Strict I/Os - Daily weights - Fall precautions Hypomagnesemia 1.5 again this a.m. - S/p 2g IV mag - Labs: Repeat a.m. mag IDA (iron deficiency anemia) Hemoglobin 13.0 within normal limits. - AM  CBCs as above T2DM (type 2 diabetes mellitus) (HCC) A1c 7.7.  Lipids well-controlled.  BHB negative. ABG unremarkable. DKA ruled out. - Continue sensitive sliding scale insulin  - Continue to hold home linagliptin, insulin glargine for now ESRD on peritoneal dialysis (HCC) CCPD yesterday AM. No issues.  - Appreciate nephrology recommendations - AM RFP's Dry gangrene (HCC) Continues to look well.  - Consider VVS consult over admission if worsening; otherwise, outpatient follow-up as scheduled for amputation CHF (congestive heart failure) (HCC) No further workup indicated at this time.  -Continue to monitor, consider repeat echo as needed -Caution with continued fluids Chronic health problem Hyperlipidemia: Continue Lipitor 80 mg nightly, continue home sevelamer.  Lipid panel unremarkable. Coronary artery disease: Continue DAPT (aspirin, Plavix)  FEN/GI: Full PPx: Heparin Dispo:Home, pending hypotension and PT/OT eval  Subjective:   On interview, patient appears somewhat down but is otherwise well. Has had some left heel pain previously, now resolved, which patient attributes to neuropathy. Unchanged from initial exam.   Objective:  BP: 86/64 HR: 74 RR: 18 T: 97.8 O2sat: 98% on RA  Significant vitals over past 24 hours:   Physical Exam:  General: Well-appearing female, NAD, attentive. Cardiovascular: RRR no m/r/g. Respiratory: CTAB. No w/r/r.  Abdomen: No tenderness in 4 quadrants. BS+.  Peritoneal dialysis site without erythema or drainage. Extremities: ROM intact.  Nonedematous.  Basic labs:  Most recent CBC Lab Results  Component Value Date   WBC 13.2 (H) 02/11/2024   HGB 13.2 02/11/2024   HCT 40.9 02/11/2024   MCV 93.6 02/11/2024   PLT 241 02/11/2024   Most recent BMP    Latest Ref Rng & Units 02/11/2024    4:43 AM  BMP  Glucose 70 - 99 mg/dL 644  BUN 8 - 23 mg/dL 31   Creatinine 4.09 - 1.00 mg/dL 8.11   Sodium 914 - 782 mmol/L 133   Potassium 3.5 - 5.1  mmol/L 3.7   Chloride 98 - 111 mmol/L 99   CO2 22 - 32 mmol/L 23   Calcium 8.9 - 10.3 mg/dL 8.4     Other pertinent labs:  WBC: 20.5 --> 15.6 --> 13.2 Mg: 1.5 --> 1.5 Bcx: +1/3 for staph epidermitis, likely contaminant.   Imaging/Diagnostic Tests:  No new imaging  Gwyndolyn Lerner, MD 02/11/2024, 8:03 AM  PGY-1, [facility] FPTS Intern pager: [phone], text pages welcome Secure chat group [facility] Teaching Service

## 2024-02-11 NOTE — Assessment & Plan Note (Signed)
 CCPD yesterday AM. No issues.  - Appreciate nephrology recommendations - AM RFP's

## 2024-02-11 NOTE — Plan of Care (Signed)
   Problem: Fluid Volume: Goal: Ability to maintain a balanced intake and output will improve Outcome: Progressing   Problem: Health Behavior/Discharge Planning: Goal: Ability to manage health-related needs will improve Outcome: Progressing

## 2024-02-11 NOTE — Assessment & Plan Note (Signed)
 No further workup indicated at this time.  -Continue to monitor, consider repeat echo as needed -Caution with continued fluids

## 2024-02-11 NOTE — Evaluation (Signed)
 Physical Therapy Evaluation Patient Details Name: Melissa Howe MRN: [id number] DOB: 1951-08-15 Today's Date: 02/11/2024  History of Present Illness  Pt is a 73 y.o. female admitted 02/09/24 for hypotension in the setting of rhino/enterovirus. Pt presented with increased fatigue over the past few days. She reports exposure to prior URI which would explain episode of emesis and poor p.o. intake.  Status appears to be improving on administration of IV fluids. PMH of CHF, ESRD on peritoneal dialysis, [name], DM2, HLD, HTN, Ischemic cardiomyopathy, and PVD.   Clinical Impression  Pt admitted with above diagnosis. PTA, pt was modI for transfers, ambulating short distances using a RW, and modI for ADLs. She relies on her daughter for assistance with stairs, getting in/out of the car, and all IADLs. Pt lives with her daughters in a two story house with a threshold to enter. Her bedroom/bathroom is up a full flight of stairs with a LHR, but pt reports her daughter is going to let her stay on the main level for the time being. Examination limited by active emesis upon entering room and continued nausea despite RN providing medication. Pt currently with functional limitations due to the deficits listed below (see PT Problem List). She required minA to transfer from chair>bed without AD. Pt will benefit from acute skilled PT to increase her independence and safety with mobility to allow discharge home with HHPT.     If plan is discharge home, recommend the following: A little help with walking and/or transfers;A little help with bathing/dressing/bathroom;Assistance with cooking/housework;Assist for transportation;Help with stairs or ramp for entrance   Can travel by private vehicle        Equipment Recommendations None recommended by PT (Pt already has DME)  Recommendations for Other Services       Functional Status Assessment Patient has had a recent decline in their functional status and demonstrates the  ability to make significant improvements in function in a reasonable and predictable amount of time.     Precautions / Restrictions Precautions Precautions: Fall Recall of Precautions/Restrictions: Intact Restrictions Weight Bearing Restrictions Per Provider Order: No      Mobility  Bed Mobility Overal bed mobility: Needs Assistance Bed Mobility: Sit to Supine       Sit to supine: Contact guard assist   General bed mobility comments: Pt sat EOB, CGA for safety to guide pt back to bed. She brought BLE back into bed slowly. Pt repositioned herself in the center of bed and towards [facility] with VC to use bedrails above her and bend B knee to push herself up.    Transfers Overall transfer level: Needs assistance Equipment used: 2 person hand held assist (pt held onto back of PT's arms) Transfers: Bed to chair/wheelchair/BSC     Step pivot transfers: Min assist       General transfer comment: Pt transferred from recliner chair to bed on her R without an AD by taking slow short steps and demonstrated a posterior lean.    Ambulation/Gait               General Gait Details: Deferred d/t continued feeling of nausea following earlier emesis.  Stairs            Wheelchair Mobility     Tilt Bed    Modified [place] (Stroke Patients Only)       Balance Overall balance assessment: Needs assistance Sitting-balance support: Feet supported, No upper extremity supported Sitting balance-Leahy Scale: Good Sitting balance - Comments: Pt seated in  recliner chair. She assisted with gown change and cleaning herself following emesis. Seated EOB pt took of her socks. Able to reach outside BOS without LOB. Postural control: Posterior lean Standing balance support: Bilateral upper extremity supported, During functional activity Standing balance-Leahy Scale: Poor Standing balance comment: Pt dependent on BUE support by PT, maintained a posterior lean, and required minA for  stability and safety.                             Pertinent Vitals/Pain Pain Assessment Pain Assessment: No/denies pain    Home Living Family/patient expects to be discharged to:: Private residence Living Arrangements: Children (2 daughters) Available Help at Discharge: Family;Available PRN/intermittently Type of Home: House Home Access: Stairs to enter Entrance Stairs-Rails: None Entrance Stairs-Number of Steps: 1 (threshold) Alternate Level Stairs-Number of Steps: Flight Home Layout: Two level;Bed/bath upstairs;Able to live on main level with bedroom/bathroom;Full bath on main level Home Equipment: Grab bars - tub/shower;Rolling Walker (2 wheels);Hand held shower head;Tub bench Additional Comments: Pt reports she typically lives upstairs, but her daughter is going to let her stay downstairs and use her full bathroom.    Prior Function Prior Level of Function : Needs assist;History of Falls (last six months)       Physical Assist : Mobility (physical);ADLs (physical) Mobility (physical): Stairs;Gait ADLs (physical): IADLs Mobility Comments: Ambulates using a RW. Pt reports her daughter holds onto her when walking as she drifts to the L. Pt reports "she crawls up the stairs with her daughter behind her and will walk down the steps holding on to rail and wall with her daughter infront of her." Pt reports a sedentary lifestyle, spending most of her time upstairs and will transfer from bed to recliner chair. 3 falls in the last 8mo. Pt requires assistance to get in/out of the car. ADLs Comments: ModI for ADLs, takes increased time to complete. Pt reports before she got the tub bench washing up in front of the sink, but now will get in the shower. Relies on daugther for iADLS.     Extremity/Trunk Assessment   Upper Extremity Assessment Upper Extremity Assessment: Generalized weakness;Right hand dominant    Lower Extremity Assessment Lower Extremity Assessment:  Generalized weakness    Cervical / Trunk Assessment Cervical / Trunk Assessment: Normal  Communication   Communication Communication: No apparent difficulties    Cognition Arousal: Alert Behavior During Therapy: WFL for tasks assessed/performed   PT - Cognitive impairments: No apparent impairments                       PT - Cognition Comments: Pt A,Ox4 Following commands: Intact       Cueing Cueing Techniques: Verbal cues     General Comments General comments (skin integrity, edema, etc.): Pt greeted seated in recliner chair with active emesis. RN notified and entered to provide pt with medication to alleviate nausea. Pt denied dizziness and lightheadedness. BP (seated EOB): 145/72.    Exercises     Assessment/Plan    PT Assessment Patient needs continued PT services  PT Problem List Decreased strength;Decreased activity tolerance;Decreased balance;Decreased mobility       PT Treatment Interventions DME instruction;Gait training;Stair training;Functional mobility training;Therapeutic activities;Therapeutic exercise;Balance training;Patient/family education    PT Goals (Current goals can be found in the Care Plan section)  Acute Rehab PT Goals Patient Stated Goal: Return Home PT Goal Formulation: With patient Time For Goal Achievement: 02/25/24 Potential  to Achieve Goals: Good    Frequency Min 2X/week     Co-evaluation               AM-PAC PT "6 Clicks" Mobility  Outcome Measure Help needed turning from your back to your side while in a flat bed without using bedrails?: A Little Help needed moving from lying on your back to sitting on the side of a flat bed without using bedrails?: A Little Help needed moving to and from a bed to a chair (including a wheelchair)?: A Little Help needed standing up from a chair using your arms (e.g., wheelchair or bedside chair)?: A Little Help needed to walk in hospital room?: A Little Help needed climbing 3-5  steps with a railing? : A Lot 6 Click Score: 17    End of Session   Activity Tolerance: Other (comment) (Patient limited by nausea) Patient left: in bed;with call bell/phone within reach;with bed alarm set Nurse Communication: Mobility status PT Visit Diagnosis: Muscle weakness (generalized) (M62.81);Unsteadiness on feet (R26.81);Difficulty in walking, not elsewhere classified (R26.2)    Time: 9604-5409 PT Time Calculation (min) (ACUTE ONLY): 34 min   Charges:   PT Evaluation $PT Eval Moderate Complexity: 1 Mod   PT General Charges $$ ACUTE PT VISIT: 1 Visit         Glenford Lanes, PT, DPT Acute Rehabilitation Services Office: [phone] Secure Chat Preferred  Riva Chester 02/11/2024, 2:41 PM

## 2024-02-11 NOTE — Progress Notes (Addendum)
   02/11/24 1918  Cycler Setup  Total Number of Night Cycles 4  Night Fill Volume 2500  Dianeal Solution Dextrose 1.5% in 6000 mL Low Cal/Low Mag  Night Dwell Time per Cycle - Hour(s) 2  Night Time Therapy - Minute(s) 23  Night Time Therapy - Hour(s) 10  Minimum Initial Drain Volume 0  Maximum Peritoneal Volume 3750  Night/Total Therapy Volume 69629  Day Exchange No  Completion  Treatment Status Started  Hand-off documentation  Hand-off Given Given to shift RN/LPN  Report given to (Full Name) Edyth Grana     PD tx initation note:    Pre TX VS: 162/94   Pre TX weight: 75.3kg   PD treatment initiated via aseptic technique. Consent signed and in chart. Patient is alert and oriented. No complaints of pain. No specimen collected. PD exit site clean, dry and intact. Gentamycin and new dressing applied. Bedside RN educated on PD machine and how to contact tech support when PD machine alarms.

## 2024-02-11 NOTE — Assessment & Plan Note (Addendum)
 Very low pressures overnight to 84/30. Now somewhat improved. Asymptomatic this morning. Eating well, although dislikes the food. No dizziness. Did cough up a "large pill" earlier yesterday evening. Underwent peritoneal dialysis without incident. BCx 1/3 returned S.epidermitis, likely contaminant per pharmacy.  - Appreciate nephrology consult - Increase midodrine 5 mg 3 times daily --> 10 mg 3 times daily - Discontinue code sepsis IV antibiotics -- very low concern for bacteremia. Blood cultures negative at 24 hours. Afebrile throughout course. Infectious sources (peritoneal dialysis, gangrenous fifth L digit) have shown no pain, erythema. No abdominal pain or soreness.  - PT/OT consult place, appreciate recs - Labs: A.m. renal function panel, a.m. CBC - Strict I/Os - Daily weights - Fall precautions
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
## 2024-02-11 NOTE — Assessment & Plan Note (Signed)
 Hemoglobin 13.0 within normal limits. - AM CBCs as above

## 2024-02-11 NOTE — Assessment & Plan Note (Signed)
 A1c 7.7.  Lipids well-controlled.  BHB negative. ABG unremarkable. DKA ruled out. - Continue sensitive sliding scale insulin  - Continue to hold home linagliptin, insulin glargine for now

## 2024-02-11 NOTE — Assessment & Plan Note (Signed)
 Hyperlipidemia: Continue Lipitor 80 mg nightly, continue home sevelamer.  Lipid panel unremarkable. Coronary artery disease: Continue DAPT (aspirin, Plavix)

## 2024-02-11 NOTE — Progress Notes (Signed)
   02/11/24 0639  Completion  Treatment Status Complete  Initial Drain Volume 1149  Average Dwell Time-Hour(s) 2  Average Drain Time 23  Total Therapy Volume 9999  Total Therapy Time-Hour(s) 11  Total Therapy Time-Min(s) 32  Effluent Appearance Clear;Yellow  Fluid Balance - CCPD  Total UF (+ value on cycler, pt loss) 149 mL  Procedure Comments  Tolerated treatment well? Yes  Hand-off documentation  Hand-off Given Given to shift RN/LPN  Report given to (Full Name) Armstead Bertrand RN   PD post treatment note  PD treatment completed. Patient tolerated treatment well. PD effluent is clear. No specimen collected.  PD exit site clean, dry and intact. Patient is awake, oriented and in no acute distress.  Report given to bedside nurse.   Post treatment VS: 98T 93/55BP 67HR 100% RA 18RR  Total UF removed:  - 

## 2024-02-11 NOTE — Progress Notes (Signed)
 [facility] Progress Note   Subjective:Seen in room. BP soft today. Doesn't feel well No issues with PD overnight.     Objective Vitals:   02/11/24 0630 02/11/24 0637 02/11/24 0820 02/11/24 1139  BP: (!) 87/29 (!) 93/55 (!) 86/64 (!) 95/56  Pulse:   74 70  Resp:   18 20  Temp:   97.8 F (36.6 C)   TempSrc:   Oral   SpO2:   98% 98%  Weight:      Height:       Physical Exam General: Pleasant older female in NAD Heart: S1,S2 RRR  Lungs: CTAB Abdomen: NABS Extremities:No LE edema Dialysis Access: PD cath abdomen   Additional Objective Labs: Basic Metabolic Panel: Recent Labs  Lab 02/09/24 1151 02/10/24 0452 02/11/24 0443  NA 134* 135 133*  K 4.0 3.8 3.7  CL 93* 101 99  CO2 27 25 23   GLUCOSE 304* 70 166*  BUN 26* 29* 31*  CREATININE 8.69* 8.48* 8.13*  CALCIUM 9.8 8.2* 8.4*  PHOS  --   --  2.7   Liver Function Tests: Recent Labs  Lab 02/11/24 0443  ALBUMIN 1.8*   No results for input(s): "LIPASE", "AMYLASE" in the last 168 hours. CBC: Recent Labs  Lab 02/09/24 1151 02/10/24 0452 02/11/24 0443  WBC 20.5* 15.6* 13.2*  HGB 16.0* 13.0 13.2  HCT 51.3* 41.0 40.9  MCV 95.9 95.6 93.6  PLT 300 232 241   Blood Culture    Component Value Date/Time   SDES BLOOD RIGHT ANTECUBITAL 02/09/2024 1343   SPECREQUEST  02/09/2024 1343    BOTTLES DRAWN AEROBIC AND ANAEROBIC Blood Culture results may not be optimal due to an inadequate volume of blood received in culture bottles   CULT  02/09/2024 1343    NO GROWTH 2 DAYS Performed at [facility], [address] [place], [place] [address]    REPTSTATUS PENDING 02/09/2024 1343    Cardiac Enzymes: No results for input(s): "CKTOTAL", "CKMB", "CKMBINDEX", "TROPONINI" in the last 168 hours. CBG: Recent Labs  Lab 02/10/24 1206 02/10/24 1615 02/10/24 2201 02/11/24 0819 02/11/24 1137  GLUCAP 101* 97 140* 150* 154*   Iron Studies: No results for input(s): "IRON", "TIBC", "TRANSFERRIN",  "FERRITIN" in the last 72 hours. @lablastinr3 @ Studies/Results: No results found. Medications:   aspirin  81 mg Oral Daily   atorvastatin  80 mg Oral QHS   calcitRIOL  0.5 mcg Oral Daily   cinacalcet  30 mg Oral Daily   clopidogrel  75 mg Oral Daily   gabapentin  100 mg Oral BID   gentamicin cream  1 Application Topical Daily   heparin  5,000 Units Subcutaneous Q8H   insulin aspart  0-6 Units Subcutaneous TID WC   midodrine  10 mg Oral TID WC   potassium chloride SA  20 mEq Oral Daily   sevelamer carbonate  3,200 mg Oral TID WC   sevelamer carbonate  800 mg Oral With snacks     OP PD: CAPD Dr Marisue Humble, [facility] 7 days / week  3 exchanges per day, 8 hr dwell time, 3 L dwell volume   73.5kg dry wt  - rocaltrol 0.5 mcg po daily       Assessment/ Plan: Hypotension: suspected sepsis, getting IV abx, blood cx's sent. No abd pain and PD fluid clear this am per family. Per pmd.  ESRD: on CAPD. Will do CCPD and follow.  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 Cont binders w/ meals.   Villa Burgin H. [place] NP-C 02/11/2024, 2:55 PM  [employer] [phone]

## 2024-02-11 NOTE — Assessment & Plan Note (Signed)
 1.5 again this a.m. - S/p 2g IV mag - Labs: Repeat a.m. mag

## 2024-02-11 NOTE — Plan of Care (Signed)
  Problem: Fluid Volume: Goal: Hemodynamic stability will improve Outcome: Progressing   Problem: Clinical Measurements: Goal: Diagnostic test results will improve Outcome: Progressing   Problem: Metabolic: Goal: Ability to maintain appropriate glucose levels will improve Outcome: Progressing   Problem: Nutritional: Goal: Maintenance of adequate nutrition will improve Outcome: Progressing   Problem: Education: Goal: Knowledge of General Education information will improve Description: Including pain rating scale, medication(s)/side effects and non-pharmacologic comfort measures Outcome: Progressing

## 2024-02-12 ENCOUNTER — Other Ambulatory Visit (HOSPITAL_COMMUNITY): Payer: Self-pay

## 2024-02-12 DIAGNOSIS — Z992 Dependence on renal dialysis: Secondary | ICD-10-CM | POA: Diagnosis not present

## 2024-02-12 DIAGNOSIS — N186 End stage renal disease: Secondary | ICD-10-CM | POA: Diagnosis not present

## 2024-02-12 DIAGNOSIS — I9589 Other hypotension: Secondary | ICD-10-CM | POA: Diagnosis not present

## 2024-02-12 LAB — BASIC METABOLIC PANEL WITH GFR
Anion gap: 13 (ref 5–15)
BUN: 28 mg/dL — ABNORMAL HIGH (ref 8–23)
CO2: 21 mmol/L — ABNORMAL LOW (ref 22–32)
Calcium: 8.4 mg/dL — ABNORMAL LOW (ref 8.9–10.3)
Chloride: 96 mmol/L — ABNORMAL LOW (ref 98–111)
Creatinine, Ser: 7.87 mg/dL — ABNORMAL HIGH (ref 0.44–1.00)
GFR, Estimated: 5 mL/min — ABNORMAL LOW (ref >60)
Glucose, Bld: 182 mg/dL — ABNORMAL HIGH (ref 70–99)
Potassium: 3.8 mmol/L (ref 3.5–5.1)
Sodium: 130 mmol/L — ABNORMAL LOW (ref 135–145)

## 2024-02-12 LAB — MAGNESIUM: Magnesium: 2 mg/dL (ref 1.7–2.4)

## 2024-02-12 LAB — GLUCOSE, CAPILLARY
Glucose-Capillary: 181 mg/dL — ABNORMAL HIGH (ref 70–99)
Glucose-Capillary: 132 mg/dL — ABNORMAL HIGH (ref 70–99)
Glucose-Capillary: 194 mg/dL — ABNORMAL HIGH (ref 70–99)

## 2024-02-12 MED ORDER — NEPRO/CARBSTEADY PO LIQD
237.0000 mL | Freq: Two times a day (BID) | ORAL | Status: DC
  Administered 2024-02-12 (×2): 237 mL via ORAL

## 2024-02-12 MED ORDER — ONDANSETRON 4 MG PO TBDP
4.0000 mg | ORAL_TABLET | Freq: Three times a day (TID) | ORAL | 0 refills | Status: DC | PRN
  Filled 2024-02-12: qty 20, 7d supply, fill #0

## 2024-02-12 MED ORDER — CALCITRIOL 0.25 MCG PO CAPS: 0.2500 ug | ORAL_CAPSULE | Freq: Every day | ORAL | Status: DC

## 2024-02-12 MED ORDER — MIDODRINE HCL 10 MG PO TABS
10.0000 mg | ORAL_TABLET | Freq: Three times a day (TID) | ORAL | 0 refills | Status: AC
  Filled 2024-02-12: qty 90, 30d supply, fill #0

## 2024-02-12 MED ORDER — SODIUM CHLORIDE 0.9 % IV BOLUS
500.0000 mL | Freq: Once | INTRAVENOUS | Status: AC
Start: 2024-02-12 — End: 2024-02-12
  Administered 2024-02-12: 500 mL via INTRAVENOUS

## 2024-02-12 MED ORDER — NEPRO/CARBSTEADY PO LIQD
237.0000 mL | Freq: Two times a day (BID) | ORAL | 0 refills | Status: DC
  Filled 2024-02-12: qty 1000, 2d supply, fill #0

## 2024-02-12 MED ORDER — FERRIC CITRATE 1 GM 210 MG(FE) PO TABS: 210.0000 mg | ORAL_TABLET | Freq: Two times a day (BID) | ORAL | Status: DC

## 2024-02-12 NOTE — Discharge Instructions (Addendum)
 Dear Lacretia Piccolo Vannostrand,  Thank you for letting us  participate in your care. You were hospitalized for vomiting and diagnosed with Hypotension in the setting of a a cold. You were treated with IV fluids and started on Midodrine  10 mg TID.   POST-HOSPITAL & CARE INSTRUCTIONS Please take your new medication Midodrine  10mg  three times a day. Keep an eye on your blood pressure If you have any new infectious symptoms (fever, chills) please seek out care either by calling your family physician or presenting to the emergency department.  Go to your follow up appointments (listed below) Please call your PCP, Gaetana Jones, to set up a follow-up outpatient primary care appointment. This is called a "hospital follow-up visit." It is important that he see you to ensure that your health is continuing to improve. Please follow up with Fawn Hooks, MD at [facility] [phone] about your ferric citrate  (AURYXIA ) medication and whether or not it would be safe to change back to your sevelamer  carbonate (RENVELA ) medication instead. Until then, please take your AURYXIA  as prescribed.    DOCTOR'S APPOINTMENT   Future Appointments  Date Time Provider Department Center  04/20/2024  8:30 AM [facility] HS VASC 1 [facility] VVS  04/20/2024  9:00 AM [facility] HS VASC 1 [facility] V[facility]  04/20/2024  9:45 AM [facility] PA-2 [facility]    Follow-up Information     [facility] Follow up.   Specialty: Home Health Services Contact information: [address] [place] [address] [phone]         Shannan Dart., FNP Follow up.   Specialty: Family Medicine Why: Please reach out to your family medicine doctor for a post-hospital visit within one week. Contact information: [address] [place] [address] [phone]                 Take care and be well!  Family Medicine Teaching Service Inpatient Team [place]  [facility]  [address]  [place], [place] [address] [phone]

## 2024-02-12 NOTE — Procedures (Signed)
 PD completed. Tolerated well.

## 2024-02-12 NOTE — Progress Notes (Signed)
 Mobility Specialist Progress Note;   02/12/24 1024  Mobility  Activity Transferred from bed to chair  Level of Assistance Contact guard assist, steadying assist  Assistive Device Other (Comment) (HHA)  Distance Ambulated (ft) 3 ft  Activity Response Tolerated well  Mobility Referral Yes  Mobility visit 1 Mobility  Mobility Specialist Start Time (ACUTE ONLY) 1024  Mobility Specialist Stop Time (ACUTE ONLY) 1034  Mobility Specialist Time Calculation (min) (ACUTE ONLY) 10 min   Pt agreeable to mobility. Required MinG assistance via HHA to safely transfer pt from bed to chair. VSS and no c/o when asked. Pt states she is feeling much better compared to yesterday. Requested to be bathed, NT notified. Pt left in chair with all needs met, call bell in reach.   Lauraine Erm Mobility Specialist Please contact via SecureChat or [employer] [phone]

## 2024-02-12 NOTE — TOC Transition Note (Signed)
 Transition of Care (TOC) - Discharge Note Sherin Dingwall RN, BSN Transitions of Care Unit 4E- RN Case Manager See Treatment Team for direct phone # [phone] cross coverage  Patient Details  Name: Melissa Howe MRN: [id number] Date of Birth: September 08, 1951  Transition of Care [facility]) CM/SW Contact:  Rox Cope, RN Phone Number: 02/12/2024, 12:36 PM   Clinical Narrative:    Pt stable for transition home today, noted per previous CM note- HH has been arranged w/ Gas[facility] Karst. Liaison notified for start of care HHPT   Family to transport home, No further TOC needs noted.    Final next level of care: Home w Home Health Services Barriers to Discharge: Barriers Resolved   Patient Goals and CMS Choice Patient states their goals for this hospitalization and ongoing recovery are:: to return to home CMS Medicare.gov Compare Post Acute Care list provided to:: Patient Choice offered to / list presented to : Patient      Discharge Placement             Home w/ [facility]          Discharge Plan and Services Additional resources added to the After Visit Summary for     Discharge Planning Services: CM Consult Post Acute Care Choice: Home Health          DME Arranged: N/A DME Agency: NA       HH Arranged: PT (secure chatted team for orders) HH Agency: [facility] Date [facility] Agency Contacted: 02/11/24 Time HH Agency Contacted: 1558 Representative spoke with at [facility] Agency: Randel Buss await call back  Social Drivers of Health (SDOH) Interventions SDOH Screenings   Food Insecurity: No Food Insecurity (02/09/2024)  Housing: Low Risk  (02/10/2024)  Transportation Needs: No Transportation Needs (02/09/2024)  Utilities: Not At Risk (02/09/2024)  Physical Activity: Unknown (08/06/2019)  Social Connections: Socially Isolated (02/09/2024)  Stress: No Stress Concern Present (08/06/2019)  Tobacco Use: Medium Risk (02/09/2024)     Readmission Risk Interventions    02/12/2024   12:36 PM   Readmission Risk Prevention Plan  Transportation Screening Complete  HRI or Home Care Consult Complete  Social Work Consult for Recovery Care Planning/Counseling Complete  Palliative Care Screening Not Applicable  Medication Review Oceanographer) Complete

## 2024-02-12 NOTE — Care Management Important Message (Signed)
 Important Message  Patient Details  Name: Melissa Howe MRN: [id number] Date of Birth: Nov 06, 1950   Important Message Given:  Yes - Medicare IM     Janith Melnick 02/12/2024, 10:24 AM

## 2024-02-12 NOTE — Progress Notes (Signed)
 [facility] Progress Note   Subjective: Seen, looks better today.      Objective Vitals:   02/11/24 1655 02/11/24 1957 02/12/24 0512 02/12/24 0720  BP: (!) 162/94 (!) 145/79 104/65 (!) 101/59  Pulse: 73 73 63 65  Resp: 20 16 20 18   Temp: (!) 97.5 F (36.4 C) (!) 97.4 F (36.3 C) 97.9 F (36.6 C) 98 F (36.7 C)  TempSrc: Oral Oral Oral Oral  SpO2: 99% 99% 97% 98%  Weight:      Height:       Physical Exam General: Pleasant older female in NAD Heart: S1,S2 RRR  Lungs: Few bibasilar crackles otherwise CTAB Abdomen: NABS Extremities:No LE edema Dialysis Access: PD cath abdomen  Additional Objective Labs: Basic Metabolic Panel: Recent Labs  Lab 02/10/24 0452 02/11/24 0443 02/12/24 0446  NA 135 133* 130*  K 3.8 3.7 3.8  CL 101 99 96*  CO2 25 23 21*  GLUCOSE 70 166* 182*  BUN 29* 31* 28*  CREATININE 8.48* 8.13* 7.87*  CALCIUM  8.2* 8.4* 8.4*  PHOS  --  2.7  --    Liver Function Tests: Recent Labs  Lab 02/11/24 0443  ALBUMIN  1.8*   No results for input(s): LIPASE, AMYLASE in the last 168 hours. CBC: Recent Labs  Lab 02/09/24 1151 02/10/24 0452 02/11/24 0443  WBC 20.5* 15.6* 13.2*  HGB 16.0* 13.0 13.2  HCT 51.3* 41.0 40.9  MCV 95.9 95.6 93.6  PLT 300 232 241   Blood Culture    Component Value Date/Time   SDES BLOOD RIGHT ANTECUBITAL 02/09/2024 1343   SPECREQUEST  02/09/2024 1343    BOTTLES DRAWN AEROBIC AND ANAEROBIC Blood Culture results may not be optimal due to an inadequate volume of blood received in culture bottles   CULT  02/09/2024 1343    NO GROWTH 3 DAYS Performed at [facility], [address], [place], [place] [address]    REPTSTATUS PENDING 02/09/2024 1343    Cardiac Enzymes: No results for input(s): CKTOTAL, CKMB, CKMBINDEX, TROPONINI in the last 168 hours. CBG: Recent Labs  Lab 02/11/24 0819 02/11/24 1137 02/11/24 1750 02/11/24 2113 02/12/24 0823  GLUCAP 150* 154* 100* 193* 181*   Iron   Studies: No results for input(s): IRON , TIBC, TRANSFERRIN, FERRITIN in the last 72 hours. @lablastinr3 @ Studies/Results: No results found. Medications:  sodium chloride       aspirin   81 mg Oral Daily   atorvastatin   80 mg Oral QHS   calcitRIOL   0.5 mcg Oral Daily   cinacalcet   30 mg Oral Daily   clopidogrel   75 mg Oral Daily   ferric citrate   210 mg Oral BID WC   gabapentin   100 mg Oral BID   gentamicin  cream  1 Application Topical Daily   heparin   5,000 Units Subcutaneous Q8H   insulin  aspart  0-6 Units Subcutaneous TID WC   midodrine   10 mg Oral TID WC   potassium chloride  SA  20 mEq Oral Daily     OP PD: CAPD Dr Marlee, [facility] 7 days / week  3 exchanges per day, 8 hr dwell time, 3 L dwell volume   73.5kg dry wt  - rocaltrol  0.5 mcg po daily       Assessment/ Plan: Hypotension: suspected sepsis, getting IV abx, blood cx's sent. No abd pain and PD fluid clear this am per family. Per pmd.  ESRD: on CAPD. Will do CCPD and follow.  Volume: Patient appeared dry 04/17 rec'd IVF. Better today. BP still  a bit soft. Give 500 cc NS bolus now. Continue PD with all 1.5% exchanges.  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 Cont binders w/ meals.     Charman Blasco H. Arnelle Nale NP-C 02/12/2024, 9:13 AM  [employer] [phone]

## 2024-02-12 NOTE — Progress Notes (Signed)
 Physical Therapy Treatment Patient Details Name: Melissa Howe MRN: [id number] DOB: 1951-06-05 Today's Date: 02/12/2024   History of Present Illness Pt is a 73 y.o. female admitted 02/09/24 for hypotension in the setting of rhino/enterovirus. Pt presented with increased fatigue over the past few days. She reports exposure to prior URI which would explain episode of emesis and poor p.o. intake.  Status appears to be improving on administration of IV fluids. PMH of CHF, ESRD on peritoneal dialysis, [name], DM2, HLD, HTN, Ischemic cardiomyopathy, and PVD.    PT Comments  Pt greeted supine in bed, pleasant and agreeable to PT session. She completed supine>sit with modI. Pt demonstrated good sitting balance with ability to reach outside her BOS without LOB. Pt was unable to progress OOB mobility d/t active emesis prior to attempting to stand. Pt declined further mobility d/t lingering nausea, RN and MD notified. Will continue to progress ambulation as pt has only been able to transfer bed<>chair. Her primary limiting factor is nausea.   Blood Pressure Supine: 102/81 (86)  Seated EOB: 107/64 (79) Following Emesis (seated EOB): 163/90 (112)   If plan is discharge home, recommend the following: A little help with walking and/or transfers;A little help with bathing/dressing/bathroom;Assistance with cooking/housework;Assist for transportation;Help with stairs or ramp for entrance   Can travel by private vehicle        Equipment Recommendations  None recommended by PT    Recommendations for Other Services       Precautions / Restrictions Precautions Precautions: Fall Recall of Precautions/Restrictions: Intact Restrictions Weight Bearing Restrictions Per Provider Order: No     Mobility  Bed Mobility Overal bed mobility: Modified Independent Bed Mobility: Supine to Sit           General bed mobility comments: Pt sat up on R side of bed with head and feet elevated. No physical assistance  or VC for sequencing. Pt stayed seated EOB at end of session.    Transfers Overall transfer level: Needs assistance Equipment used: Rolling walker (2 wheels)               General transfer comment: Pt began to initate stand with proper hand positioning RW. She reported increased nauses and requested trash can. Pt began to have active emesis, RN and MD notified. She declined further mobility attempts d/t continued nausea.    Ambulation/Gait               General Gait Details: Pt declined d/t continued feeling of nausea following emesis.   Stairs             Wheelchair Mobility     Tilt Bed    Modified Etzkorn (Stroke Patients Only)       Balance Overall balance assessment: Needs assistance Sitting-balance support: Feet supported, No upper extremity supported Sitting balance-Leahy Scale: Good Sitting balance - Comments: Pt sat EOB, reached outside her BOS to don shoes. No LOB.                                    Communication Communication Communication: No apparent difficulties  Cognition Arousal: Alert Behavior During Therapy: WFL for tasks assessed/performed   PT - Cognitive impairments: No apparent impairments                         Following commands: Intact      Cueing Cueing Techniques: Verbal cues  Exercises      General Comments General comments (skin integrity, edema, etc.): BP: supine 102/81 (86); seated EOB 107/64 (79). Pt reported feeling fine denied dizziness or nausea. When attempting to stand pt had active emesis and c/o lingering nausea, RN and MD notified. BP: following emesis 163/90 (112).      Pertinent Vitals/Pain Pain Assessment Pain Assessment: No/denies pain (Nauseous)    Home Living                          Prior Function            PT Goals (current goals can now be found in the care plan section) Acute Rehab PT Goals Patient Stated Goal: Return Home Progress towards PT  goals: Not progressing toward goals - comment (Unable to progress OOB mobility d/t nausea and active emesis.)    Frequency    Min 2X/week      PT Plan      Co-evaluation              AM-PAC PT "6 Clicks" Mobility   Outcome Measure  Help needed turning from your back to your side while in a flat bed without using bedrails?: A Little Help needed moving from lying on your back to sitting on the side of a flat bed without using bedrails?: A Little Help needed moving to and from a bed to a chair (including a wheelchair)?: A Little Help needed standing up from a chair using your arms (e.g., wheelchair or bedside chair)?: A Little Help needed to walk in hospital room?: A Little Help needed climbing 3-5 steps with a railing? : A Lot 6 Click Score: 17    End of Session   Activity Tolerance: Other (comment) (Treatment limited secondary to active emesis and pt limited by nausea) Patient left: in bed;with call bell/phone within reach Nurse Communication: Mobility status;Other (comment) (BP response, active emesis, and lingering nausea.) PT Visit Diagnosis: Muscle weakness (generalized) (M62.81);Unsteadiness on feet (R26.81);Difficulty in walking, not elsewhere classified (R26.2)     Time: 1191-4782 PT Time Calculation (min) (ACUTE ONLY): 17 min  Charges:    $Therapeutic Activity: 8-22 mins PT General Charges $$ ACUTE PT VISIT: 1 Visit                     Glenford Lanes, PT, DPT Acute Rehabilitation Services Office: [phone] Secure Chat Preferred  Riva Chester 02/12/2024, 1:47 PM

## 2024-02-12 NOTE — Discharge Summary (Addendum)
 Family Medicine Teaching [facility] Discharge Summary  Patient name: Melissa Howe Medical record number: [id number] Date of birth: November 01, 1950 Age: 73 y.o. Gender: female Date of Admission: 02/09/2024  Date of Discharge: 02/12/2024 Admitting Physician: Morene Cleveland, MD  Primary Care Provider: Claudene Prentice [name], FNP Consultants: Nephrology   Indication for Hospitalization: Hypotension  Discharge Diagnoses/Problem List:  Principal Problem for Admission: Hypotension Other Problems addressed during stay:  Principal Problem:   Hypotension Active Problems:   T2DM (type 2 diabetes mellitus) (HCC)   ESRD on peritoneal dialysis (HCC)   IDA (iron  deficiency anemia)   Chronic health problem   Dry gangrene (HCC)   CHF (congestive heart failure) (HCC)   Hypomagnesemia  Brief Hospital Course:  Melissa Howe is a 73 y.o.female with a history of CHF, ESRD on PD, [name], DM2, HLD, HTN, Ischemic cardiomyopathy, and PVD who was admitted to the [facility] Medicine Teaching Service at [facility] for hypotension in the setting of nausea and vomiting.   Her hospital course is detailed below:  Hypotension Had continued lower pressures overnight.  However, patient remained asymptomatic throughout stay. By day 2, patient had returned to baseline and desire to leave the hospital.  Restarted Midodrine  in response to lower pressures, titrated to 10 mg TID. Blood pressures improved appreciably by discharge.   Sepsis infectious protocol In the ED, patient received Flagyl , cefepime , vancomycin  per sepsis protocol as well as 1 L IVF. Blood cultures drawn, which remained without growth.  (1 of 3 cultures grew Staph epidermidis, likely contaminant).  Discontinued antibiotics (4/15-4/17) with very low clinical suspicion for sepsis.  Remained afebrile throughout course.  No infectious symptomatology. Respiratory panel returned positive for rhinoenterovirus. Treated conservatively.   Vertigo Potentially  complicated by hypotension. Non-focal neurologic exam. Notes room spinning while standing and unsteadiness on feet even when anchored and standing. Episodes of nausea in the hospital. Will need outpatient evaluation for vertigo alongside home health PT.   Other chronic conditions were medically managed with home medications and formulary alternatives as necessary (T2DM, ESRD on PD 3x daily, CHF, HLD, [name], IDA, dry gangrene)  PCP Follow-up Recommendations: Follow up clinical symptoms of hypotension Outpatient vertigo evaluation    Disposition: Home with home health and PT  Discharge Condition: Stable  Discharge Exam:  Vitals:   02/12/24 0512 02/12/24 0720  BP: 104/65 (!) 101/59  Pulse: 63 65  Resp: 20 18  Temp: 97.9 F (36.6 C) 98 F (36.7 C)  SpO2: 97% 98%   General: Well-appearing female, NAD, attentive. Cardiovascular: RRR no m/r/g. Respiratory: CTAB. No w/r/r.  Abdomen: No tenderness in 4 quadrants. BS+. Extremities: ROM intact.  Nonedematous. Neuro: Alert and oriented. Speech normal. Smile symmetric. Strength 4/5 and equal in all four extremities. EOMI. Sensation intact bilaterally. No upper extremity drift. FNF intact.   Significant Procedures: Peritoneal dialysis PM of 4/16 and 4/17  Significant Labs and Imaging:  Recent Labs  Lab 02/11/24 0443  WBC 13.2*  HGB 13.2  HCT 40.9  PLT 241   Recent Labs  Lab 02/11/24 0443 02/12/24 0446  NA 133* 130*  K 3.7 3.8  CL 99 96*  CO2 23 21*  GLUCOSE 166* 182*  BUN 31* 28*  CREATININE 8.13* 7.87*  CALCIUM  8.4* 8.4*  MG 1.5* 2.0  PHOS 2.7  --   ALBUMIN  1.8*  --     CXR (4/15): No acute findings.   Results/Tests Pending at Time of Discharge: None  Discharge Medications:  Allergies as of 02/12/2024  No Known Allergies      Medication List     STOP taking these medications    isosorbide  mononitrate 30 MG 24 hr tablet Commonly known as: IMDUR    potassium chloride  SA 20 MEQ tablet Commonly known as:  KLOR-CON  M   sevelamer  carbonate 800 MG tablet Commonly known as: RENVELA        TAKE these medications    acetaminophen  650 MG CR tablet Commonly known as: Tylenol  8 Hour Take 1 tablet (650 mg total) by mouth every 6 (six) hours as needed for pain or fever.   aspirin  81 MG chewable tablet Chew 1 tablet (81 mg total) by mouth daily.   atorvastatin  80 MG tablet Commonly known as: LIPITOR  Take 80 mg by mouth at bedtime.   calcitRIOL  0.25 MCG capsule Commonly known as: ROCALTROL  Take 1 capsule (0.25 mcg total) by mouth daily. Start taking on: February 13, 2024 What changed:  medication strength how much to take   cinacalcet  30 MG tablet Commonly known as: SENSIPAR  Take 30 mg by mouth daily. With snack   CLEAR EYES COMPLETE OP Place 1 drop into both eyes daily as needed (dry eyes).   clopidogrel  75 MG tablet Commonly known as: Plavix  Take 1 tablet (75 mg total) by mouth daily.   feeding supplement (NEPRO CARB STEADY) Liqd Take 237 mLs by mouth 2 (two) times daily between meals.   ferric citrate  1 GM 210 MG(Fe) tablet Commonly known as: AURYXIA  Take 1 tablet (210 mg total) by mouth 2 (two) times daily with a meal.   gabapentin  100 MG capsule Commonly known as: NEURONTIN  Take 100 mg by mouth 2 (two) times daily.   Insulin  Glargine Solostar 100 UNIT/ML Solostar Pen Commonly known as: LANTUS  Inject 20 Units into the skin daily.   Insulin  Pen Needle 32G X 4 MM Misc Use 4x a day   linagliptin  5 MG Tabs tablet Commonly known as: TRADJENTA  Take 1 tablet (5 mg total) by mouth daily.   magnesium  gluconate 500 MG tablet Commonly known as: MAGONATE Take 500 mg by mouth 2 (two) times a week.   midodrine  10 MG tablet Commonly known as: PROAMATINE  Take 1 tablet (10 mg total) by mouth 3 (three) times daily with meals.   ondansetron  4 MG disintegrating tablet Commonly known as: ZOFRAN -ODT Take 1 tablet (4 mg total) by mouth every 8 (eight) hours as needed for nausea or  vomiting.   oxyCODONE -acetaminophen  5-325 MG tablet Commonly known as: PERCOCET/ROXICET Take 1 tablet by mouth every 6 (six) hours as needed. What changed: reasons to take this   triamcinolone  lotion 0.1 % Commonly known as: KENALOG  Apply 1 Application topically 2 (two) times daily as needed (irritation).               Discharge Care Instructions  (From admission, onward)           Start     Ordered   02/12/24 0000  Discharge wound care:       Comments: Clean skin near exit site with chloraprep swab sticks.  Starting at catheter, use circular pattern around exit site, moving towards outer edges of area covered by dressing.  Apply gentamicin  cream to site once daily.  Cover with dry dressing.   02/12/24 1213            Discharge Instructions: Please refer to Patient Instructions section of EMR for full details.  Patient was counseled important signs and symptoms that should prompt return to medical care, changes in  medications, dietary instructions, activity restrictions, and follow up appointments.   Follow-Up Appointments:  Follow-up Information     [facility], [facility] Follow up.   Specialty: Home Health Services Why: HHPT arranged- they will contact you to schedule Contact information: [address] [place] [address] [phone]         Claudene Prentice [name], FNP Follow up.   Specialty: Family Medicine Why: Please reach out to your family medicine doctor for a post-hospital visit within one week. Contact information: [address] [place] [address] [phone]                 Rollene Katz, MD 02/12/2024, 2:32 PM PGY-1, [facility] Family Medicine

## 2024-02-12 NOTE — Plan of Care (Signed)
  Problem: Coping: Goal: Ability to adjust to condition or change in health will improve Outcome: Progressing   Problem: Health Behavior/Discharge Planning: Goal: Ability to identify and utilize available resources and services will improve Outcome:

## 2024-02-13 NOTE — Discharge Planning (Signed)
 [facility] Patient Discharge Orders - [facility] CLINIC: [facility] (Home Therapies)  Patient's name: Melissa Howe Admit/DC Dates: 02/09/2024 - 02/12/2024  DISCHARGE DIAGNOSES: Hypotension- restarted midodrine  10 mg TID Rhinoenterovirus  ESRD on CAPD  HD ORDER CHANGES: Heparin  change: n/a EDW Change: n/a New EDW:  [place]ath Change: n/a  ANEMIA MANAGEMENT: Aranesp : Given: no    ESA dose for discharge: mircera per protocol IV Iron  dose at discharge: per protocol Transfusion: Given: n/a  BONE/MINERAL MEDICATIONS: Hectorol/Calcitriol  change: yes - 0.25 mcg daily Sensipar /Parsabiv change: n/a  ACCESS INTERVENTION/CHANGE: n/a Details:   RECENT LABS: Recent Labs  Lab 02/11/24 0443 02/12/24 0446  HGB 13.2  --   NA 133* 130*  K 3.7 3.8  CALCIUM  8.4* 8.4*  PHOS 2.7  --   ALBUMIN  1.8*  --     IV ANTIBIOTICS: n/a Details:  OTHER ANTICOAGULATION: On Coumadin?: n/a Last INR: Managed By:  OTHER/APPTS/LAB ORDERS:   D/C Meds to be reconciled by nurse after every discharge.  Completed By: Hersey Lorenzo, NP-C   Reviewed by: MD:______ RN_______

## 2024-02-14 LAB — CULTURE, BLOOD (ROUTINE X 2): Culture: NO GROWTH

## 2024-02-15 ENCOUNTER — Telehealth: Payer: Self-pay | Admitting: Cardiovascular Disease

## 2024-02-15 NOTE — Telephone Encounter (Signed)
 Calling to r/s her pre opp appt. Please advise

## 2024-02-16 NOTE — Telephone Encounter (Signed)
 PATIENT WAS UNABLE TO GO OVER MEDICATIONS WITHOUT DTR.  DTR WILL BE PRESENT AT  TELEVISIT  4-25  WITH  ANY QUESTIONS ABOUT MEDICATIONS.     Patient Consent for Virtual Visit    Melissa Howe has provided verbal consent on 02/16/2024 for a virtual visit (video or telephone).   CONSENT FOR VIRTUAL VISIT FOR:  Melissa Howe  By participating in this virtual visit I agree to the following:  I hereby voluntarily request, consent and authorize [facility] and its employed or contracted physicians, physician assistants, nurse practitioners or other licensed health care professionals (the Practitioner), to provide me with telemedicine health care services (the "Services") as deemed necessary by the treating Practitioner. I acknowledge and consent to receive the Services by the Practitioner via telemedicine. I understand that the telemedicine visit will involve communicating with the Practitioner through live audiovisual communication technology and the disclosure of certain medical information by electronic transmission. I acknowledge that I have been given the opportunity to request an in-person assessment or other available alternative prior to the telemedicine visit and am voluntarily participating in the telemedicine visit.  I understand that I have the right to withhold or withdraw my consent to the use of telemedicine in the course of my care at any time, without affecting my right to future care or treatment, and that the Practitioner or I may terminate the telemedicine visit at any time. I understand that I have the right to inspect all information obtained and/or recorded in the course of the telemedicine visit and may receive copies of available information for a reasonable fee.  I understand that some of the potential risks of receiving the Services via telemedicine include:  Delay or interruption in medical evaluation due to technological equipment failure or  disruption; Information transmitted may not be sufficient (e.g. poor resolution of images) to allow for appropriate medical decision making by the Practitioner; and/or  In rare instances, security protocols could fail, causing a breach of personal health information.  Furthermore, I acknowledge that it is my responsibility to provide information about my medical history, conditions and care that is complete and accurate to the best of my ability. I acknowledge that Practitioner's advice, recommendations, and/or decision may be based on factors not within their control, such as incomplete or inaccurate data provided by me or distortions of diagnostic images or specimens that may result from electronic transmissions. I understand that the practice of medicine is not an exact science and that Practitioner makes no warranties or guarantees regarding treatment outcomes. I acknowledge that a copy of this consent can be made available to me via my patient portal [facility] MyChart), or I can request a printed copy by calling the office of [facility].    I understand that my insurance will be billed for this visit.   I have read or had this consent read to me. I understand the contents of this consent, which adequately explains the benefits and risks of the Services being provided via telemedicine.  I have been provided ample opportunity to ask questions regarding this consent and the Services and have had my questions answered to my satisfaction. I give my informed consent for the services to be provided through the use of telemedicine in my medical care

## 2024-02-16 NOTE — Telephone Encounter (Signed)
 Pt requesting cb regarding update on clearance. Looks like tele visit done on 4/16

## 2024-02-18 NOTE — Progress Notes (Unsigned)
 Virtual Visit via Telephone Note   Because of Melissa Howe co-morbid illnesses, she is at least at moderate risk for complications without adequate follow up.  This format is felt to be most appropriate for this patient at this time.  Due to technical limitations with video connection (technology), today's appointment will be conducted as an audio only telehealth visit, and Melissa Howe verbally agreed to proceed in this manner.   All issues noted in this document were discussed and addressed.  No physical exam could be performed with this format.  Evaluation Performed:  Preoperative cardiovascular risk assessment _____________   Date:  02/18/2024   Patient ID:  Melissa Howe, Melissa Howe 1951-03-19, MRN [id number] Patient Location:  Home Provider location:   Office  Primary Care Provider:  Shannan Dart., FNP Primary Cardiologist:  Oneil Bigness, MD  Chief Complaint / Patient Profile   73 y.o. y/o female with a h/o coronary artery disease, ESRD, hyperlipidemia, systolic CHF who is pending amputation left fifth finger and presents today for telephonic preoperative cardiovascular risk assessment.  History of Present Illness    Melissa Howe is a 73 y.o. female who presents via audio/video conferencing for a telehealth visit today.  Pt was last seen in cardiology clinic on 04/13/2023 by Dr. Rolm Clos.  At that time Melissa Howe was doing well .  The patient is now pending procedure as outlined above. Since her last visit, she continues to be stable from a cardiac standpoint.  She denies chest pain, shortness of breath, lower extremity edema, fatigue, palpitations, melena, hematuria, hemoptysis, diaphoresis, weakness, presyncope, syncope, orthopnea, and PND.   Past Medical History    Past Medical History:  Diagnosis Date   Anemia    CHF (congestive heart failure) (HCC)    Constipation    Coronary artery disease    Diabetes mellitus    Type II   Dyspnea    with  exertion   ESRD (end stage renal disease) (HCC)    dialyzing 3 times a day with peritoneal dialysis catheter that was placed in 04/2023   History of blood transfusion    Hyperlipemia    Hypertension    Ischemic cardiomyopathy    Myocardial infarction [facility])    Peripheral vascular disease (HCC)    Pneumonia 2019   Past Surgical History:  Procedure Laterality Date   ABDOMINAL AORTOGRAM W/LOWER EXTREMITY N/A 10/15/2022   Procedure: ABDOMINAL AORTOGRAM W/LOWER EXTREMITY;  Surgeon: Kayla Part, MD;  Location: [facility] INVASIVE CV LAB;  Service: Cardiovascular;  Laterality: N/A;   ABDOMINAL AORTOGRAM W/LOWER EXTREMITY N/A 11/30/2023   Procedure: ABDOMINAL AORTOGRAM W/LOWER EXTREMITY;  Surgeon: Adine Hoof, MD;  Location: [facility] INVASIVE CV LAB;  Service: Cardiovascular;  Laterality: N/A;   APPENDECTOMY     AV FISTULA PLACEMENT Left 10/05/2019   Procedure: INSERTION OF ARTERIOVENOUS (AV) GORE-TEX VASCULAR GRAFT LEFT ARM;  Surgeon: Mayo Speck, MD;  Location: [facility] OR;  Service: Vascular;  Laterality: Left;   AV FISTULA PLACEMENT Left 02/27/2021   Procedure: LEFT ARM FIRST STAGE BASILIC VEIN  ARTERIOVENOUS (AV) FISTULA CREATION;  Surgeon: Adine Hoof, MD;  Location: [facility] OR;  Service: Vascular;  Laterality: Left;   BASCILIC VEIN TRANSPOSITION Left 04/17/2021   Procedure: LEFT SECOND STAGE BASCILIC VEIN TRANSPOSITION;  Surgeon: Adine Hoof, MD;  Location: [facility] OR;  Service: Vascular;  Laterality: Left;   BIOPSY  07/24/2020   Procedure: BIOPSY;  Surgeon: Albertina Hugger, MD;  Location:  [facility] ENDOSCOPY;  Service: Gastroenterology;;   BUBBLE STUDY  01/02/2020   Procedure: BUBBLE STUDY;  Surgeon: Euell Herrlich, MD;  Location: [facility] ENDOSCOPY;  Service: Cardiology;;   CAPD INSERTION N/A 05/01/2023   Procedure: LAPAROSCOPIC INSERTION CONTINUOUS AMBULATORY PERITONEAL DIALYSIS  (CAPD) CATHETER;  Surgeon: Adine Hoof, MD;  Location: [facility] OR;  Service: Vascular;   Laterality: N/A;   CARDIAC CATHETERIZATION     COLONOSCOPY WITH PROPOFOL  N/A 07/24/2020   Procedure: COLONOSCOPY WITH PROPOFOL ;  Surgeon: Albertina Hugger, MD;  Location: [facility] ENDOSCOPY;  Service: Gastroenterology;  Laterality: N/A;   CORONARY STENT INTERVENTION N/A 07/12/2021   Procedure: CORONARY STENT INTERVENTION;  Surgeon: Lucendia Rusk, MD;  Location: [facility] INVASIVE CV LAB;  Service: Cardiovascular;  Laterality: N/A;   CORONARY ULTRASOUND/IVUS N/A 07/12/2021   Procedure: Intravascular Ultrasound/IVUS;  Surgeon: Lucendia Rusk, MD;  Location: [facility] INVASIVE CV LAB;  Service: Cardiovascular;  Laterality: N/A;   ESOPHAGOGASTRODUODENOSCOPY (EGD) WITH PROPOFOL  N/A 07/24/2020   Procedure: ESOPHAGOGASTRODUODENOSCOPY (EGD) WITH PROPOFOL ;  Surgeon: Albertina Hugger, MD;  Location: [facility] ENDOSCOPY;  Service: Gastroenterology;  Laterality: N/A;   EYE SURGERY Bilateral    Cataract   FOREIGN BODY REMOVAL Left 05/13/2018   Procedure: FOREIGN BODY REMOVAL left foot;  Surgeon: Jasmine Mesi, MD;  Location: [facility] OR;  Service: Orthopedics;  Laterality: Left;   I & D EXTREMITY Left 05/21/2018   Procedure: LEFT FOOT DEBRIDEMENT AND WOUND CLOSURE;  Surgeon: Timothy Ford, MD;  Location: [facility] OR;  Service: Orthopedics;  Laterality: Left;   LEFT HEART CATH AND CORONARY ANGIOGRAPHY N/A 07/12/2021   Procedure: LEFT HEART CATH AND CORONARY ANGIOGRAPHY;  Surgeon: Lucendia Rusk, MD;  Location: [facility] INVASIVE CV LAB;  Service: Cardiovascular;  Laterality: N/A;   LIGATION OF ARTERIOVENOUS  FISTULA Left 01/15/2024   Procedure: LIGATION OF ARTERIOVENOUS  FISTULA;  Surgeon: Adine Hoof, MD;  Location: [facility] OR;  Service: Vascular;  Laterality: Left;   PERIPHERAL VASCULAR BALLOON ANGIOPLASTY  11/30/2023   Procedure: PERIPHERAL VASCULAR BALLOON ANGIOPLASTY;  Surgeon: Adine Hoof, MD;  Location: [facility] INVASIVE CV LAB;  Service: Cardiovascular;;  left AT   PERIPHERAL VASCULAR INTERVENTION   11/30/2023   Procedure: PERIPHERAL VASCULAR INTERVENTION;  Surgeon: Adine Hoof, MD;  Location: [facility] INVASIVE CV LAB;  Service: Cardiovascular;;  stent left AT   POLYPECTOMY  07/24/2020   Procedure: POLYPECTOMY;  Surgeon: Albertina Hugger, MD;  Location: [facility] ENDOSCOPY;  Service: Gastroenterology;;   TEE WITHOUT CARDIOVERSION N/A 01/02/2020   Procedure: TRANSESOPHAGEAL ECHOCARDIOGRAM (TEE);  Surgeon: Euell Herrlich, MD;  Location: [facility] ENDOSCOPY;  Service: Cardiology;  Laterality: N/A;   TONSILLECTOMY     TUBAL LIGATION     UPPER EXTREMITY ANGIOGRAPHY Left 11/30/2023   Procedure: Upper Extremity Angiography;  Surgeon: Adine Hoof, MD;  Location: [facility] INVASIVE CV LAB;  Service: Cardiovascular;  Laterality: Left;    Allergies  No Known Allergies  Home Medications    Prior to Admission medications   Medication Sig Start Date End Date Taking? Authorizing Provider  acetaminophen  (TYLENOL  8 HOUR) 650 MG CR tablet Take 1 tablet (650 mg total) by mouth every 6 (six) hours as needed for pain or fever. 01/04/24   Deatra Face, MD  aspirin  81 MG chewable tablet Chew 1 tablet (81 mg total) by mouth daily. 07/13/21   Ghimire, Estil Heman, MD  atorvastatin  (LIPITOR ) 80 MG tablet Take 80 mg by mouth at bedtime.    [provider]  calcitRIOL  (  ROCALTROL ) 0.25 MCG capsule Take 1 capsule (0.25 mcg total) by mouth daily. 02/13/24   Ivin Marrow, MD  cinacalcet  (SENSIPAR ) 30 MG tablet Take 30 mg by mouth daily. With snack    [provider]  clopidogrel  (PLAVIX ) 75 MG tablet Take 1 tablet (75 mg total) by mouth daily. 01/06/24 01/05/25  Rhyne, Samantha J, PA-C  ferric citrate  (AURYXIA ) 1 GM 210 MG(Fe) tablet Take 1 tablet (210 mg total) by mouth 2 (two) times daily with a meal. 02/12/24   Ivin Marrow, MD  gabapentin  (NEURONTIN ) 100 MG capsule Take 100 mg by mouth 2 (two) times daily. 11/02/23   [provider]  Hyprom-Naphaz-Polysorb-Zn Sulf (CLEAR EYES  COMPLETE OP) Place 1 drop into both eyes daily as needed (dry eyes).    [provider]  Insulin  Glargine Solostar (LANTUS ) 100 UNIT/ML Solostar Pen Inject 20 Units into the skin daily. 11/11/22   [provider]  Insulin  Pen Needle 32G X 4 MM MISC Use 4x a day 06/28/23   Thapa, [place], MD  linagliptin  (TRADJENTA ) 5 MG TABS tablet Take 1 tablet (5 mg total) by mouth daily. 06/26/23   Thapa, [place], MD  magnesium  gluconate (MAGONATE) 500 MG tablet Take 500 mg by mouth 2 (two) times a week.    [provider]  midodrine  (PROAMATINE ) 10 MG tablet Take 1 tablet (10 mg total) by mouth 3 (three) times daily with meals. 02/12/24 03/13/24  Ivin Marrow, MD  Nutritional Supplements (FEEDING SUPPLEMENT, NEPRO CARB STEADY,) LIQD Take 237 mLs by mouth 2 (two) times daily between meals. 02/12/24   Ivin Marrow, MD  ondansetron  (ZOFRAN -ODT) 4 MG disintegrating tablet Take 1 tablet (4 mg total) by mouth every 8 (eight) hours as needed for nausea or vomiting. 02/12/24   Quillen, Michael, MD  oxyCODONE -acetaminophen  (PERCOCET/ROXICET) 5-325 MG tablet Take 1 tablet by mouth every 6 (six) hours as needed. Patient taking differently: Take 1 tablet by mouth every 6 (six) hours as needed for moderate pain (pain score 4-6). 01/15/24   Butch Cashing, PA-C  triamcinolone  lotion (KENALOG ) 0.1 % Apply 1 Application topically 2 (two) times daily as needed (irritation).    [provider]    Physical Exam    Vital Signs:  Melissa Howe does not have vital signs available for review today.  Given telephonic nature of communication, physical exam is limited. AAOx3. NAD. Normal affect.  Speech and respirations are unlabored.  Accessory Clinical Findings    None  Assessment & Plan    1.  Preoperative Cardiovascular Risk Assessment:Procedure:   amputation left fifth finger    Date of Surgery:  Clearance 02/25/24                                  Surgeon: Dr. Brunilda Capra Surgeon's  Group or Practice Name:  [facility] Phone number:  [phone] Fax number:  [phone]      Primary Cardiologist: Oneil Bigness, MD  Chart reviewed as part of pre-operative protocol coverage. Given past medical history and time since last visit, based on ACC/AHA guidelines, Melissa Howe would be at acceptable risk for the planned procedure without further cardiovascular testing.   Her RC RI is high risk, greater than 11% risk of major cardiac event.  She is able to complete greater than 4 METS of physical activity.  Patient was advised that if she develops new symptoms prior to surgery  to contact our office to arrange a follow-up appointment.  Melissa Howe verbalized understanding.  Plavix  prescribed by a noncardiology provider (vascular surgery) therefore recommendations for holding deferred to prescribing provider.     Ideally aspirin  should be continued without interruption, however if the bleeding risk is too great, aspirin  may be held for 5-7 days prior to surgery. Please resume aspirin  post operatively when it is felt to be safe from a bleeding standpoint.    I will route this recommendation to the requesting party via Epic fax function and remove from pre-op pool.       Time:   Today, I have spent 5 minutes with the patient with telehealth technology discussing medical history, symptoms, and management plan.  I spent 10 minutes reviewing her past medical history, cardiac medications, and cardiac test.   Carie Charity, NP  02/18/2024, 1:29 PM

## 2024-02-19 ENCOUNTER — Ambulatory Visit: Attending: Cardiology

## 2024-02-19 DIAGNOSIS — Z01810 Encounter for preprocedural cardiovascular examination: Secondary | ICD-10-CM

## 2024-02-22 ENCOUNTER — Other Ambulatory Visit: Payer: Self-pay

## 2024-02-22 ENCOUNTER — Emergency Department (HOSPITAL_COMMUNITY)

## 2024-02-22 ENCOUNTER — Emergency Department (HOSPITAL_COMMUNITY)
Admission: EM | Admit: 2024-02-22 | Discharge: 2024-02-22 | Disposition: A | Discharge status: Home / Self Care | Attending: Emergency Medicine | Admitting: Emergency Medicine

## 2024-02-22 ENCOUNTER — Encounter (HOSPITAL_COMMUNITY): Payer: Self-pay | Admitting: Emergency Medicine

## 2024-02-22 DIAGNOSIS — Z992 Dependence on renal dialysis: Secondary | ICD-10-CM | POA: Diagnosis not present

## 2024-02-22 DIAGNOSIS — Z7902 Long term (current) use of antithrombotics/antiplatelets: Secondary | ICD-10-CM | POA: Insufficient documentation

## 2024-02-22 DIAGNOSIS — N186 End stage renal disease: Secondary | ICD-10-CM | POA: Diagnosis not present

## 2024-02-22 DIAGNOSIS — Z794 Long term (current) use of insulin: Secondary | ICD-10-CM | POA: Insufficient documentation

## 2024-02-22 DIAGNOSIS — Z7982 Long term (current) use of aspirin: Secondary | ICD-10-CM | POA: Insufficient documentation

## 2024-02-22 DIAGNOSIS — R519 Headache, unspecified: Secondary | ICD-10-CM | POA: Insufficient documentation

## 2024-02-22 DIAGNOSIS — E1122 Type 2 diabetes mellitus with diabetic chronic kidney disease: Secondary | ICD-10-CM | POA: Insufficient documentation

## 2024-02-22 DIAGNOSIS — I959 Hypotension, unspecified: Secondary | ICD-10-CM | POA: Diagnosis not present

## 2024-02-22 DIAGNOSIS — E860 Dehydration: Secondary | ICD-10-CM | POA: Insufficient documentation

## 2024-02-22 DIAGNOSIS — M546 Pain in thoracic spine: Secondary | ICD-10-CM | POA: Insufficient documentation

## 2024-02-22 LAB — BASIC METABOLIC PANEL WITH GFR
Anion gap: 19 — ABNORMAL HIGH (ref 5–15)
BUN: 24 mg/dL — ABNORMAL HIGH (ref 8–23)
CO2: 23 mmol/L (ref 22–32)
Calcium: 9.3 mg/dL (ref 8.9–10.3)
Chloride: 92 mmol/L — ABNORMAL LOW (ref 98–111)
Creatinine, Ser: 8.32 mg/dL — ABNORMAL HIGH (ref 0.44–1.00)
GFR, Estimated: 5 mL/min — ABNORMAL LOW (ref >60)
Glucose, Bld: 269 mg/dL — ABNORMAL HIGH (ref 70–99)
Potassium: 3.4 mmol/L — ABNORMAL LOW (ref 3.5–5.1)
Sodium: 134 mmol/L — ABNORMAL LOW (ref 135–145)

## 2024-02-22 LAB — CBC
HCT: 53.9 % — ABNORMAL HIGH (ref 36.0–46.0)
Hemoglobin: 16.8 g/dL — ABNORMAL HIGH (ref 12.0–15.0)
MCH: 30.5 pg (ref 26.0–34.0)
MCHC: 31.2 g/dL (ref 30.0–36.0)
MCV: 98 fL (ref 80.0–100.0)
Platelets: 284 10*3/uL (ref 150–400)
RBC: 5.5 MIL/uL — ABNORMAL HIGH (ref 3.87–5.11)
RDW: 14.8 % (ref 11.5–15.5)
WBC: 19.9 10*3/uL — ABNORMAL HIGH (ref 4.0–10.5)
nRBC: 0 % (ref 0.0–0.2)

## 2024-02-22 LAB — RESP PANEL BY RT-PCR (RSV, FLU A&B, COVID)  RVPGX2
Influenza A by PCR: NEGATIVE
Influenza B by PCR: NEGATIVE
Resp Syncytial Virus by PCR: NEGATIVE
SARS Coronavirus 2 by RT PCR: NEGATIVE

## 2024-02-22 LAB — MAGNESIUM: Magnesium: 1.8 mg/dL (ref 1.7–2.4)

## 2024-02-22 MED ORDER — METOCLOPRAMIDE HCL 5 MG/ML IJ SOLN
5.0000 mg | Freq: Once | INTRAMUSCULAR | Status: AC
  Administered 2024-02-22: 5 mg via INTRAVENOUS
  Filled 2024-02-22: qty 2

## 2024-02-22 MED ORDER — SODIUM CHLORIDE 0.9 % IV BOLUS
500.0000 mL | Freq: Once | INTRAVENOUS | Status: AC
  Administered 2024-02-22: 500 mL via INTRAVENOUS

## 2024-02-22 MED ORDER — MIDODRINE HCL 5 MG PO TABS
5.0000 mg | ORAL_TABLET | Freq: Three times a day (TID) | ORAL | Status: DC
  Administered 2024-02-22: 5 mg via ORAL
  Filled 2024-02-22: qty 1

## 2024-02-22 NOTE — Discharge Instructions (Addendum)
 Please call your nephrologist to talk about your daily water intake.  You need to drink more water.  Your blood test show that you are dehydrated.  Dehydration can cause headaches, dizziness, low blood pressure.  *  You are having a headache. This condition is very commonly seen in the Emergency Department. No specific cause was found today for your headache. It may have been a migraine or another cause of headache. Stress, anxiety, fatigue, and depression are common triggers for headaches.   Fortunately, your headache today does not appear to be life-threatening or require hospitalization at this time. This may change in the future. Sometimes headaches can appear benign (not harmful), but then more serious symptoms can develop, which should prompt an immediate re-evaluation by your doctor or the emergency department.  It is very important that you speak to your primary care doctor in the next 48 hours, to re-evaluate your symptoms, and to determine whether you need any further diagnostic testing or treatment.   SEEK MEDICAL ATTENTION IF: - you develop reaction or side effects to the medications prescribed.  - your headache is not improving within 48 hours - you have multiple episodes of vomiting or can't drink fluids - you have a change in pattern from your usual headache (eg. New location, new intensity, new symptoms, etc)  RETURN IMMEDIATELY IF you develop a sudden, severe worsening of your headache or confusion, become poorly responsive, feel like passing out, develop a fever above 100.59F, have a change in speech, vision, or swallowing, develop new weakness, numbness, tingling, or incoordination, or have a seizure.

## 2024-02-22 NOTE — ED Provider Notes (Signed)
 [place] [facility] AT [facility] Provider Note   CSN: [id number] Arrival date & time: 02/22/24  1003     History  Chief Complaint  Patient presents with   Hypotension   Headache    Melissa Howe is a 73 y.o. female with history of end-stage renal disease on peritoneal dialysis at home, high cholesterol, chronic hypotension on midodrine , diabetes on insulin , presenting from her physician's office with concern for headache and low blood pressure.  Patient reports she has had a headache mostly in the back of her head for about 3 days, tends to be better when lying down or worse with standing up.  She also reports pain in her upper back for a few days worse with standing.  She denies fevers or chills.  She reports she has chronic low blood pressure and takes midodrine  although did not take it today.  HPI     Home Medications Prior to Admission medications   Medication Sig Start Date End Date Taking? Authorizing Provider  acetaminophen  (TYLENOL  8 HOUR) 650 MG CR tablet Take 1 tablet (650 mg total) by mouth every 6 (six) hours as needed for pain or fever. 01/04/24   Deatra Face, MD  aspirin  81 MG chewable tablet Chew 1 tablet (81 mg total) by mouth daily. 07/13/21   Ghimire, Estil Heman, MD  atorvastatin  (LIPITOR ) 80 MG tablet Take 80 mg by mouth at bedtime.    [provider]  calcitRIOL  (ROCALTROL ) 0.25 MCG capsule Take 1 capsule (0.25 mcg total) by mouth daily. 02/13/24   Ivin Marrow, MD  cinacalcet  (SENSIPAR ) 30 MG tablet Take 30 mg by mouth daily. With snack    [provider]  clopidogrel  (PLAVIX ) 75 MG tablet Take 1 tablet (75 mg total) by mouth daily. 01/06/24 01/05/25  Rhyne, Samantha J, PA-C  ferric citrate  (AURYXIA ) 1 GM 210 MG(Fe) tablet Take 1 tablet (210 mg total) by mouth 2 (two) times daily with a meal. 02/12/24   Ivin Marrow, MD  gabapentin  (NEURONTIN ) 100 MG capsule Take 100 mg by mouth 2 (two) times daily. 11/02/23    [provider]  Hyprom-Naphaz-Polysorb-Zn Sulf (CLEAR EYES COMPLETE OP) Place 1 drop into both eyes daily as needed (dry eyes).    [provider]  Insulin  Glargine Solostar (LANTUS ) 100 UNIT/ML Solostar Pen Inject 20 Units into the skin daily. 11/11/22   [provider]  Insulin  Pen Needle 32G X 4 MM MISC Use 4x a day 06/28/23   Thapa, [place], MD  linagliptin  (TRADJENTA ) 5 MG TABS tablet Take 1 tablet (5 mg total) by mouth daily. 06/26/23   Thapa, [place], MD  magnesium  gluconate (MAGONATE) 500 MG tablet Take 500 mg by mouth 2 (two) times a week.    [provider]  midodrine  (PROAMATINE ) 10 MG tablet Take 1 tablet (10 mg total) by mouth 3 (three) times daily with meals. 02/12/24 03/13/24  Ivin Marrow, MD  Nutritional Supplements (FEEDING SUPPLEMENT, NEPRO CARB STEADY,) LIQD Take 237 mLs by mouth 2 (two) times daily between meals. 02/12/24   Ivin Marrow, MD  ondansetron  (ZOFRAN -ODT) 4 MG disintegrating tablet Take 1 tablet (4 mg total) by mouth every 8 (eight) hours as needed for nausea or vomiting. 02/12/24   Quillen, Michael, MD  oxyCODONE -acetaminophen  (PERCOCET/ROXICET) 5-325 MG tablet Take 1 tablet by mouth every 6 (six) hours as needed. Patient taking differently: Take 1 tablet by mouth every 6 (six) hours as needed for moderate pain (pain score 4-6). 01/15/24   Hazeline Lister,  Ewell Holes, PA-C  triamcinolone  lotion (KENALOG ) 0.1 % Apply 1 Application topically 2 (two) times daily as needed (irritation).    [provider]      Allergies    Patient has no known allergies.    Review of Systems   Review of Systems  Physical Exam Updated Vital Signs BP 136/70   Pulse 65   Temp (!) 97 F (36.1 C) (Rectal)   Resp 12   SpO2 96%  Physical Exam  ED Results / Procedures / Treatments   Labs (all labs ordered are listed, but only abnormal results are displayed) Labs Reviewed  BASIC METABOLIC PANEL WITH GFR - Abnormal; Notable for the following  components:      Result Value   Sodium 134 (*)    Potassium 3.4 (*)    Chloride 92 (*)    Glucose, Bld 269 (*)    BUN 24 (*)    Creatinine, Ser 8.32 (*)    GFR, Estimated 5 (*)    Anion gap 19 (*)    All other components within normal limits  CBC - Abnormal; Notable for the following components:   WBC 19.9 (*)    RBC 5.50 (*)    Hemoglobin 16.8 (*)    HCT 53.9 (*)    All other components within normal limits  RESP PANEL BY RT-PCR (RSV, FLU A&B, COVID)  RVPGX2  MAGNESIUM     EKG None  Radiology No results found.  Procedures Procedures    Medications Ordered in ED Medications  midodrine  (PROAMATINE ) tablet 5 mg (5 mg Oral Given 02/22/24 1028)  metoCLOPramide  (REGLAN ) injection 5 mg (5 mg Intravenous Given 02/22/24 1023)  sodium chloride  0.9 % bolus 500 mL (0 mLs Intravenous Stopped 02/22/24 1545)    ED Course/ Medical Decision Making/ A&P Clinical Course as of 02/22/24 1554  Mon Feb 22, 2024  1510 Headache completely resolved.  BP normalized.  Pt's daughter tells me patient drinks "maybe 1 bottle of water every 2 or 3 days."  This is not enough.  I strongly suspect this is the cause of her headaches and hypotension, as well as her hemoconcentrated labs evaluate blood cell count and hemoglobin.  Patient was given 500 cc of fluid here and advised that she increase her fluid intake at home and discuss her daily fluid amounts with her nephrologist.  They verbalized understanding.  But she is stable for discharge [MT]    Clinical Course User Index [MT] Verlene Glantz, Janalyn Me, MD                                 Medical Decision Making Amount and/or Complexity of Data Reviewed Labs: ordered. Radiology: ordered.  Risk Prescription drug management.   This patient presents to the ED with concern for hypotension and headache. This involves an extensive number of treatment options, and is a complaint that carries with it a high risk of complications and morbidity.    Headache may  be tension type versus dehydration versus hypotension versus other  Low blood pressure may be related to dehydration versus other  Additional history obtained from EMS and family member  External records from outside source obtained and reviewed including prior evaluations for hypotension, patient on midodrine   I ordered and personally interpreted labs.  The pertinent results include: Suspected hemoconcentrated labs with anion gap of 19, white blood cell count and hemoglobin elevation.  I ordered imaging studies including CT  head and cervical spine I independently visualized and interpreted imaging which showed no emergent findings I agree with the radiologist interpretation  The patient was maintained on a cardiac monitor.  I personally viewed and interpreted the cardiac monitored which showed an underlying rhythm of: Regular heart rate  I ordered medication including 500 cc IV fluid bolus for hypotension and suspected dehydration; IV Reglan  for headache, midodrine  for chronic blood pressure management  I have reviewed the patients home medicines and have made adjustments as needed  Test Considered: No indication for LP, doubt acute meningitis, doubt CVA or stroke, no indication for MRI at this time.  After the interventions noted above, I reevaluated the patient and found that they have: improved  Social Determinants of Health: patient and daughter counseled about increasing her fluid intake at home  Dispostion:  After consideration of the diagnostic results and the patients response to treatment, I feel that the patent would benefit from outpatient follow-up         Final Clinical Impression(s) / ED Diagnoses Final diagnoses:  Dehydration  Nonintractable headache, unspecified chronicity pattern, unspecified headache type    Rx / DC Orders ED Discharge Orders     None         Arvilla Birmingham, MD 02/22/24 1554

## 2024-02-22 NOTE — ED Triage Notes (Signed)
 Pt BIB by EMS from PCP office for hypotension and HA. Peritoneal dialysis pt. Pt denies any know fevers or ABD pain. Reports N/V x 1 episode prior to arrival. Approx. 50 ml NS given en route.  EMS VS: BP 93/55 HR 72 100% RA CBG 272

## 2024-02-29 ENCOUNTER — Inpatient Hospital Stay (HOSPITAL_COMMUNITY)
Admission: EM | Admit: 2024-02-29 | Discharge: 2024-03-02 | DRG: 314 | Disposition: A | Discharge status: Home / Self Care | Attending: Family Medicine | Admitting: Family Medicine

## 2024-02-29 ENCOUNTER — Encounter (HOSPITAL_COMMUNITY): Payer: Self-pay

## 2024-02-29 ENCOUNTER — Emergency Department (HOSPITAL_COMMUNITY)

## 2024-02-29 ENCOUNTER — Other Ambulatory Visit: Payer: Self-pay

## 2024-02-29 DIAGNOSIS — R7989 Other specified abnormal findings of blood chemistry: Secondary | ICD-10-CM | POA: Diagnosis not present

## 2024-02-29 DIAGNOSIS — E785 Hyperlipidemia, unspecified: Secondary | ICD-10-CM | POA: Diagnosis present

## 2024-02-29 DIAGNOSIS — N2581 Secondary hyperparathyroidism of renal origin: Secondary | ICD-10-CM | POA: Diagnosis present

## 2024-02-29 DIAGNOSIS — A419 Sepsis, unspecified organism: Principal | ICD-10-CM

## 2024-02-29 DIAGNOSIS — R54 Age-related physical debility: Secondary | ICD-10-CM | POA: Diagnosis present

## 2024-02-29 DIAGNOSIS — Z87891 Personal history of nicotine dependence: Secondary | ICD-10-CM

## 2024-02-29 DIAGNOSIS — R112 Nausea with vomiting, unspecified: Secondary | ICD-10-CM

## 2024-02-29 DIAGNOSIS — E11311 Type 2 diabetes mellitus with unspecified diabetic retinopathy with macular edema: Secondary | ICD-10-CM | POA: Diagnosis present

## 2024-02-29 DIAGNOSIS — Z833 Family history of diabetes mellitus: Secondary | ICD-10-CM

## 2024-02-29 DIAGNOSIS — Z7984 Long term (current) use of oral hypoglycemic drugs: Secondary | ICD-10-CM

## 2024-02-29 DIAGNOSIS — I255 Ischemic cardiomyopathy: Secondary | ICD-10-CM | POA: Diagnosis present

## 2024-02-29 DIAGNOSIS — E1122 Type 2 diabetes mellitus with diabetic chronic kidney disease: Secondary | ICD-10-CM | POA: Diagnosis present

## 2024-02-29 DIAGNOSIS — E46 Unspecified protein-calorie malnutrition: Secondary | ICD-10-CM | POA: Insufficient documentation

## 2024-02-29 DIAGNOSIS — Z6828 Body mass index (BMI) 28.0-28.9, adult: Secondary | ICD-10-CM

## 2024-02-29 DIAGNOSIS — I132 Hypertensive heart and chronic kidney disease with heart failure and with stage 5 chronic kidney disease, or end stage renal disease: Secondary | ICD-10-CM | POA: Diagnosis present

## 2024-02-29 DIAGNOSIS — Z7982 Long term (current) use of aspirin: Secondary | ICD-10-CM

## 2024-02-29 DIAGNOSIS — Z8249 Family history of ischemic heart disease and other diseases of the circulatory system: Secondary | ICD-10-CM

## 2024-02-29 DIAGNOSIS — Z992 Dependence on renal dialysis: Secondary | ICD-10-CM

## 2024-02-29 DIAGNOSIS — E43 Unspecified severe protein-calorie malnutrition: Secondary | ICD-10-CM | POA: Insufficient documentation

## 2024-02-29 DIAGNOSIS — K5900 Constipation, unspecified: Secondary | ICD-10-CM | POA: Diagnosis present

## 2024-02-29 DIAGNOSIS — Z66 Do not resuscitate: Secondary | ICD-10-CM | POA: Diagnosis present

## 2024-02-29 DIAGNOSIS — E119 Type 2 diabetes mellitus without complications: Secondary | ICD-10-CM

## 2024-02-29 DIAGNOSIS — I5032 Chronic diastolic (congestive) heart failure: Secondary | ICD-10-CM | POA: Diagnosis present

## 2024-02-29 DIAGNOSIS — K219 Gastro-esophageal reflux disease without esophagitis: Secondary | ICD-10-CM | POA: Diagnosis present

## 2024-02-29 DIAGNOSIS — I959 Hypotension, unspecified: Principal | ICD-10-CM | POA: Diagnosis present

## 2024-02-29 DIAGNOSIS — Z79899 Other long term (current) drug therapy: Secondary | ICD-10-CM

## 2024-02-29 DIAGNOSIS — E1165 Type 2 diabetes mellitus with hyperglycemia: Secondary | ICD-10-CM | POA: Diagnosis present

## 2024-02-29 DIAGNOSIS — I9589 Other hypotension: Principal | ICD-10-CM

## 2024-02-29 DIAGNOSIS — R6889 Other general symptoms and signs: Secondary | ICD-10-CM | POA: Diagnosis present

## 2024-02-29 DIAGNOSIS — Z794 Long term (current) use of insulin: Secondary | ICD-10-CM

## 2024-02-29 DIAGNOSIS — I2510 Atherosclerotic heart disease of native coronary artery without angina pectoris: Secondary | ICD-10-CM | POA: Diagnosis present

## 2024-02-29 DIAGNOSIS — Z955 Presence of coronary angioplasty implant and graft: Secondary | ICD-10-CM

## 2024-02-29 DIAGNOSIS — D631 Anemia in chronic kidney disease: Secondary | ICD-10-CM | POA: Diagnosis present

## 2024-02-29 DIAGNOSIS — Z7902 Long term (current) use of antithrombotics/antiplatelets: Secondary | ICD-10-CM

## 2024-02-29 DIAGNOSIS — E876 Hypokalemia: Secondary | ICD-10-CM | POA: Insufficient documentation

## 2024-02-29 DIAGNOSIS — F32A Depression, unspecified: Secondary | ICD-10-CM | POA: Diagnosis present

## 2024-02-29 DIAGNOSIS — E1152 Type 2 diabetes mellitus with diabetic peripheral angiopathy with gangrene: Secondary | ICD-10-CM | POA: Diagnosis present

## 2024-02-29 DIAGNOSIS — N186 End stage renal disease: Secondary | ICD-10-CM | POA: Diagnosis present

## 2024-02-29 DIAGNOSIS — Z789 Other specified health status: Secondary | ICD-10-CM

## 2024-02-29 DIAGNOSIS — I96 Gangrene, not elsewhere classified: Secondary | ICD-10-CM | POA: Diagnosis present

## 2024-02-29 DIAGNOSIS — I252 Old myocardial infarction: Secondary | ICD-10-CM

## 2024-02-29 DIAGNOSIS — E860 Dehydration: Secondary | ICD-10-CM | POA: Diagnosis present

## 2024-02-29 DIAGNOSIS — R1110 Vomiting, unspecified: Secondary | ICD-10-CM | POA: Diagnosis present

## 2024-02-29 LAB — COMPREHENSIVE METABOLIC PANEL WITH GFR
ALT: 16 U/L (ref 0–44)
AST: 18 U/L (ref 15–41)
Albumin: 2.3 g/dL — ABNORMAL LOW (ref 3.5–5.0)
Alkaline Phosphatase: 115 U/L (ref 38–126)
Anion gap: 19 — ABNORMAL HIGH (ref 5–15)
BUN: 21 mg/dL (ref 8–23)
CO2: 24 mmol/L (ref 22–32)
Calcium: 8.6 mg/dL — ABNORMAL LOW (ref 8.9–10.3)
Chloride: 92 mmol/L — ABNORMAL LOW (ref 98–111)
Creatinine, Ser: 7.6 mg/dL — ABNORMAL HIGH (ref 0.44–1.00)
GFR, Estimated: 5 mL/min — ABNORMAL LOW (ref >60)
Glucose, Bld: 375 mg/dL — ABNORMAL HIGH (ref 70–99)
Potassium: 2.7 mmol/L — CL (ref 3.5–5.1)
Sodium: 135 mmol/L (ref 135–145)
Total Bilirubin: 0.7 mg/dL (ref 0.0–1.2)
Total Protein: 6.8 g/dL (ref 6.5–8.1)

## 2024-02-29 LAB — CBG MONITORING, ED
Glucose-Capillary: 295 mg/dL — ABNORMAL HIGH (ref 70–99)
Glucose-Capillary: 199 mg/dL — ABNORMAL HIGH (ref 70–99)
Glucose-Capillary: 205 mg/dL — ABNORMAL HIGH (ref 70–99)

## 2024-02-29 LAB — I-STAT VENOUS BLOOD GAS, ED
Acid-Base Excess: 2 mmol/L (ref 0.0–2.0)
Bicarbonate: 27.5 mmol/L (ref 20.0–28.0)
Calcium, Ion: 0.94 mmol/L — ABNORMAL LOW (ref 1.15–1.40)
HCT: 53 % — ABNORMAL HIGH (ref 36.0–46.0)
Hemoglobin: 18 g/dL — ABNORMAL HIGH (ref 12.0–15.0)
O2 Saturation: 48 %
Potassium: 3.2 mmol/L — ABNORMAL LOW (ref 3.5–5.1)
Sodium: 133 mmol/L — ABNORMAL LOW (ref 135–145)
TCO2: 29 mmol/L (ref 22–32)
pCO2, Ven: 46.1 mmHg (ref 44–60)
pH, Ven: 7.385 (ref 7.25–7.43)
pO2, Ven: 27 mmHg — CL (ref 32–45)

## 2024-02-29 LAB — RENAL FUNCTION PANEL
Albumin: 2 g/dL — ABNORMAL LOW (ref 3.5–5.0)
Anion gap: 18 — ABNORMAL HIGH (ref 5–15)
BUN: 21 mg/dL (ref 8–23)
CO2: 22 mmol/L (ref 22–32)
Calcium: 8.1 mg/dL — ABNORMAL LOW (ref 8.9–10.3)
Chloride: 92 mmol/L — ABNORMAL LOW (ref 98–111)
Creatinine, Ser: 7.37 mg/dL — ABNORMAL HIGH (ref 0.44–1.00)
GFR, Estimated: 5 mL/min — ABNORMAL LOW (ref >60)
Glucose, Bld: 335 mg/dL — ABNORMAL HIGH (ref 70–99)
Phosphorus: 3.2 mg/dL (ref 2.5–4.6)
Potassium: 2.7 mmol/L — CL (ref 3.5–5.1)
Sodium: 132 mmol/L — ABNORMAL LOW (ref 135–145)

## 2024-02-29 LAB — LACTIC ACID, PLASMA
Lactic Acid, Venous: 2.3 mmol/L (ref 0.5–1.9)
Lactic Acid, Venous: 1.7 mmol/L (ref 0.5–1.9)

## 2024-02-29 LAB — BRAIN NATRIURETIC PEPTIDE: B Natriuretic Peptide: 270.6 pg/mL — ABNORMAL HIGH (ref 0.0–100.0)

## 2024-02-29 LAB — CBC WITH DIFFERENTIAL/PLATELET
Abs Immature Granulocytes: 0.17 10*3/uL — ABNORMAL HIGH (ref 0.00–0.07)
Basophils Absolute: 0 10*3/uL (ref 0.0–0.1)
Basophils Relative: 0 %
Eosinophils Absolute: 0.2 10*3/uL (ref 0.0–0.5)
Eosinophils Relative: 1 %
HCT: 49.9 % — ABNORMAL HIGH (ref 36.0–46.0)
Hemoglobin: 16.3 g/dL — ABNORMAL HIGH (ref 12.0–15.0)
Immature Granulocytes: 1 %
Lymphocytes Relative: 16 %
Lymphs Abs: 2.4 10*3/uL (ref 0.7–4.0)
MCH: 30 pg (ref 26.0–34.0)
MCHC: 32.7 g/dL (ref 30.0–36.0)
MCV: 91.9 fL (ref 80.0–100.0)
Monocytes Absolute: 1 10*3/uL (ref 0.1–1.0)
Monocytes Relative: 7 %
Neutro Abs: 10.8 10*3/uL — ABNORMAL HIGH (ref 1.7–7.7)
Neutrophils Relative %: 75 %
Platelets: 242 10*3/uL (ref 150–400)
RBC: 5.43 MIL/uL — ABNORMAL HIGH (ref 3.87–5.11)
RDW: 14.6 % (ref 11.5–15.5)
WBC: 14.5 10*3/uL — ABNORMAL HIGH (ref 4.0–10.5)
nRBC: 0.3 % — ABNORMAL HIGH (ref 0.0–0.2)

## 2024-02-29 LAB — BODY FLUID CELL COUNT WITH DIFFERENTIAL: Total Nucleated Cell Count, Fluid: 4 uL (ref 0–1000)

## 2024-02-29 LAB — GLUCOSE, CAPILLARY: Glucose-Capillary: 166 mg/dL — ABNORMAL HIGH (ref 70–99)

## 2024-02-29 LAB — I-STAT CG4 LACTIC ACID, ED
Lactic Acid, Venous: 3.8 mmol/L (ref 0.5–1.9)
Lactic Acid, Venous: 3.1 mmol/L (ref 0.5–1.9)

## 2024-02-29 LAB — PROTIME-INR
INR: 1 (ref 0.8–1.2)
Prothrombin Time: 13.4 s (ref 11.4–15.2)

## 2024-02-29 LAB — MAGNESIUM: Magnesium: 1.5 mg/dL — ABNORMAL LOW (ref 1.7–2.4)

## 2024-02-29 MED ORDER — NEPRO/CARBSTEADY PO LIQD
237.0000 mL | Freq: Two times a day (BID) | ORAL | Status: DC
  Administered 2024-02-29 – 2024-03-01 (×3): 237 mL via ORAL
  Filled 2024-02-29 (×2): qty 237

## 2024-02-29 MED ORDER — MIDODRINE HCL 5 MG PO TABS
10.0000 mg | ORAL_TABLET | Freq: Three times a day (TID) | ORAL | Status: DC
  Administered 2024-02-29 – 2024-03-02 (×8): 10 mg via ORAL
  Filled 2024-02-29 (×9): qty 2

## 2024-02-29 MED ORDER — INSULIN GLARGINE-YFGN 100 UNIT/ML ~~LOC~~ SOLN
20.0000 [IU] | Freq: Every day | SUBCUTANEOUS | Status: DC
  Administered 2024-02-29 – 2024-03-02 (×3): 20 [IU] via SUBCUTANEOUS
  Filled 2024-02-29 (×4): qty 0.2

## 2024-02-29 MED ORDER — SODIUM CHLORIDE 0.9 % IV BOLUS
500.0000 mL | Freq: Once | INTRAVENOUS | Status: AC
  Administered 2024-02-29: 500 mL via INTRAVENOUS

## 2024-02-29 MED ORDER — DELFLEX-LC/1.5% DEXTROSE 344 MOSM/L IP SOLN: INTRAPERITONEAL | Status: AC

## 2024-02-29 MED ORDER — INSULIN ASPART 100 UNIT/ML IJ SOLN
0.0000 [IU] | Freq: Three times a day (TID) | INTRAMUSCULAR | Status: DC
  Administered 2024-02-29: 1 [IU] via SUBCUTANEOUS
  Administered 2024-02-29: 3 [IU] via SUBCUTANEOUS
  Administered 2024-02-29: 2 [IU] via SUBCUTANEOUS
  Administered 2024-03-01: 1 [IU] via SUBCUTANEOUS
  Administered 2024-03-01: 3 [IU] via SUBCUTANEOUS
  Administered 2024-03-01: 6 [IU] via SUBCUTANEOUS
  Administered 2024-03-02: 2 [IU] via SUBCUTANEOUS
  Administered 2024-03-02: 1 [IU] via SUBCUTANEOUS

## 2024-02-29 MED ORDER — GENTAMICIN SULFATE 0.1 % EX CREA
1.0000 | TOPICAL_CREAM | Freq: Every day | CUTANEOUS | Status: DC
  Administered 2024-03-01 (×2): 1 via TOPICAL
  Filled 2024-02-29: qty 15

## 2024-02-29 MED ORDER — ACETAMINOPHEN 325 MG PO TABS: 650.0000 mg | ORAL_TABLET | Freq: Four times a day (QID) | ORAL | Status: DC | PRN

## 2024-02-29 MED ORDER — CLOPIDOGREL BISULFATE 75 MG PO TABS
75.0000 mg | ORAL_TABLET | Freq: Every day | ORAL | Status: DC
  Administered 2024-02-29 – 2024-03-02 (×3): 75 mg via ORAL
  Filled 2024-02-29 (×3): qty 1

## 2024-02-29 MED ORDER — ARTIFICIAL TEARS OPHTHALMIC OINT: TOPICAL_OINTMENT | Freq: Two times a day (BID) | OPHTHALMIC | Status: DC | PRN

## 2024-02-29 MED ORDER — MAGNESIUM SULFATE 2 GM/50ML IV SOLN
2.0000 g | Freq: Once | INTRAVENOUS | Status: AC
  Administered 2024-02-29: 2 g via INTRAVENOUS
  Filled 2024-02-29: qty 50

## 2024-02-29 MED ORDER — SODIUM CHLORIDE 0.9 % IV SOLN
1.0000 g | INTRAVENOUS | Status: DC
  Administered 2024-03-01 – 2024-03-02 (×2): 1 g via INTRAVENOUS
  Filled 2024-02-29 (×3): qty 10

## 2024-02-29 MED ORDER — ASPIRIN 81 MG PO CHEW
81.0000 mg | CHEWABLE_TABLET | Freq: Every day | ORAL | Status: DC
  Administered 2024-02-29 – 2024-03-02 (×3): 81 mg via ORAL
  Filled 2024-02-29 (×3): qty 1

## 2024-02-29 MED ORDER — NAPHAZOLINE-GLYCERIN 0.012-0.25 % OP SOLN: Freq: Every day | OPHTHALMIC | Status: DC | PRN

## 2024-02-29 MED ORDER — ARTIFICIAL TEARS OPHTHALMIC OINT: TOPICAL_OINTMENT | Freq: Every day | OPHTHALMIC | Status: DC | PRN

## 2024-02-29 MED ORDER — ENOXAPARIN SODIUM 30 MG/0.3ML IJ SOSY: 30.0000 mg | PREFILLED_SYRINGE | Freq: Every day | INTRAMUSCULAR | Status: DC

## 2024-02-29 MED ORDER — VANCOMYCIN HCL 1500 MG/300ML IV SOLN
1500.0000 mg | Freq: Once | INTRAVENOUS | Status: AC
Start: 2024-02-29 — End: 2024-02-29
  Administered 2024-02-29: 1500 mg via INTRAVENOUS
  Filled 2024-02-29: qty 300

## 2024-02-29 MED ORDER — ONDANSETRON HCL 4 MG/2ML IJ SOLN: 4.0000 mg | Freq: Four times a day (QID) | INTRAMUSCULAR | Status: DC | PRN

## 2024-02-29 MED ORDER — POTASSIUM CHLORIDE CRYS ER 20 MEQ PO TBCR
40.0000 meq | EXTENDED_RELEASE_TABLET | Freq: Once | ORAL | Status: AC
  Administered 2024-02-29: 40 meq via ORAL
  Filled 2024-02-29: qty 2

## 2024-02-29 MED ORDER — POTASSIUM CHLORIDE 20 MEQ PO PACK
40.0000 meq | PACK | Freq: Once | ORAL | Status: AC
  Administered 2024-02-29: 40 meq via ORAL
  Filled 2024-02-29: qty 2

## 2024-02-29 MED ORDER — DELFLEX-LC/1.5% DEXTROSE 344 MOSM/L IP SOLN: INTRAPERITONEAL | Status: DC

## 2024-02-29 MED ORDER — ACETAMINOPHEN 650 MG RE SUPP: 650.0000 mg | Freq: Four times a day (QID) | RECTAL | Status: DC | PRN

## 2024-02-29 MED ORDER — VANCOMYCIN HCL IN DEXTROSE 1-5 GM/200ML-% IV SOLN: 1000.0000 mg | Freq: Once | INTRAVENOUS | Status: DC

## 2024-02-29 MED ORDER — GENTAMICIN SULFATE 0.1 % EX CREA: 1.0000 | TOPICAL_CREAM | Freq: Every day | CUTANEOUS | Status: DC

## 2024-02-29 MED ORDER — CALCITRIOL 0.25 MCG PO CAPS
0.2500 ug | ORAL_CAPSULE | Freq: Every day | ORAL | Status: DC
  Administered 2024-02-29 – 2024-03-02 (×3): 0.25 ug via ORAL
  Filled 2024-02-29 (×2): qty 1

## 2024-02-29 MED ORDER — POTASSIUM CHLORIDE 10 MEQ/100ML IV SOLN
10.0000 meq | INTRAVENOUS | Status: AC
  Administered 2024-02-29 (×2): 10 meq via INTRAVENOUS
  Filled 2024-02-29 (×2): qty 100

## 2024-02-29 MED ORDER — METRONIDAZOLE 500 MG/100ML IV SOLN
500.0000 mg | Freq: Once | INTRAVENOUS | Status: AC
  Administered 2024-02-29: 500 mg via INTRAVENOUS
  Filled 2024-02-29: qty 100

## 2024-02-29 MED ORDER — SODIUM CHLORIDE 0.9 % IV SOLN
2.0000 g | Freq: Once | INTRAVENOUS | Status: AC
  Administered 2024-02-29: 2 g via INTRAVENOUS
  Filled 2024-02-29: qty 12.5

## 2024-02-29 MED ORDER — HEPARIN SODIUM (PORCINE) 5000 UNIT/ML IJ SOLN
5000.0000 [IU] | Freq: Three times a day (TID) | INTRAMUSCULAR | Status: DC
  Administered 2024-02-29 – 2024-03-02 (×7): 5000 [IU] via SUBCUTANEOUS
  Filled 2024-02-29 (×7): qty 1

## 2024-02-29 MED ORDER — ONDANSETRON HCL 4 MG PO TABS: 4.0000 mg | ORAL_TABLET | Freq: Four times a day (QID) | ORAL | Status: DC | PRN

## 2024-02-29 MED ORDER — GABAPENTIN 100 MG PO CAPS
100.0000 mg | ORAL_CAPSULE | Freq: Two times a day (BID) | ORAL | Status: DC
  Administered 2024-02-29 – 2024-03-02 (×5): 100 mg via ORAL
  Filled 2024-02-29 (×5): qty 1

## 2024-02-29 MED ORDER — LACTATED RINGERS IV BOLUS
250.0000 mL | Freq: Once | INTRAVENOUS | Status: AC
  Administered 2024-02-29: 250 mL via INTRAVENOUS

## 2024-02-29 MED ORDER — ATORVASTATIN CALCIUM 80 MG PO TABS
80.0000 mg | ORAL_TABLET | Freq: Every day | ORAL | Status: DC
  Administered 2024-02-29 – 2024-03-01 (×2): 80 mg via ORAL
  Filled 2024-02-29 (×2): qty 1

## 2024-02-29 NOTE — Assessment & Plan Note (Signed)
 Considering hypovolemia secondary to overdialysis versus dehydration.  Possible contribution from infection but no concern for sepsis.  Need to ensure appropriate sized blood pressure cuff and technique for accurate measurements. - Admit to FMTS inpatient, Progressive unit, attending Dr. Violetta Grice - Restart home midodrine  10 mg TID w/ meals - s/p 500 mL bolus in ED, additional 250 mL LR and reevaluate volume status in AM - s/p cefepime , metronidazole , vancomycin ; consider redosing in AM pending blood cultures - Follow BCx - Delirium precautions - PT/OT eval and treat - follow up BNP - Goal MAP >65, monitor closely

## 2024-02-29 NOTE — Assessment & Plan Note (Signed)
 Hyperlipidemia: Continue atorvastatin  80 mg nightly Diabetic neuropathy: Continue gabapentin  100 mg BID Hx NSTEMI, [name]: Continue DAPT, s/p coronary artery stent, chronic stable hgb Dry eyes: artificial tears daily as needed

## 2024-02-29 NOTE — Assessment & Plan Note (Signed)
 Noted on admission, persisted on recheck with hypomagnesemia.  Is on 20 mEq daily at home, but did not restart inpatient in setting of dialysis, will exercise caution with electrolyte management. - 40 mEq Kcl and 2 g Mag now and follow up Nephrology plan for electrolytes

## 2024-02-29 NOTE — Assessment & Plan Note (Addendum)
 Nephrology following. Possible infection will consider impiric treatment vs culturing.  - Midodrine  as above - Renal diet with 1200 mL fluid restriction - Strict I&Os, daily weights - AM RFP - Peritoneal dialysis management per nephrology - Follow-up nephrology recommendations

## 2024-02-29 NOTE — Progress Notes (Addendum)
 Daily Progress Note Intern Pager: [phone]  Patient name: Melissa Howe Medical record number: [id number] Date of birth: Jun 11, 1951 Age: 73 y.o. Gender: female  Primary Care Provider: Shannan Dart., FNP Consultants: Nephrology Code Status: Full code  Pt Overview and Major Events to Date:  Melissa Howe is a 73 y.o. female presenting with vomiting and brief resolved period of altered consciousness as well as a hyperglycemia and admitted for persistent hypotension.   Assessment and Plan:  Unclear etiology for patient's hypotension will defer largely to nephrology for their assessment of fluid status and for any concern of peritonitis, which on our exam appears to be low suspicion.  Will continue antibiotics until blood cultures negative for 48 hours. Assessment & Plan Hypotension Considering hypovolemia secondary to over dialysis, dehydration, infection, fluid shift including peritoneum, autonomic dysfunction, midodrine  non adherence. S/o 750+ mL IVF and fluid status exam demonstrates hypovolema/euvolemia. MAPs appropriate.  - Restart home midodrine  10 mg TID w/ meals - narrow Abx per pharmacy  - discontinue metronidazole  - continue vancomycin  dosed per pharm (started 5/4, ending .Melissa AasAaron Howe)  - continue cefipime dosed per Pharm (started 5/4, ending .Melissa AasAaron Howe) - Follow Bcx, will likely stop abx if negative for 48 hours - Delirium precautions - PT/OT eval and treat - Goal MAP >65, monitor closely ESRD on peritoneal dialysis [facility]) Nephrology following. Possible infection will consider impiric treatment vs culturing.  - Midodrine  as above - Renal diet with 1200 mL fluid restriction - Strict I&Os, daily weights - AM RFP - Peritoneal dialysis management per nephrology - Follow-up nephrology recommendations High serum lactate Lactate 3.8->3.1->2.3 status post ~1 L fluids.  Likely elevated in setting of poor tissue perfusion, possibly infection.  - Follow-up lactate trend - Defer to  nephrology for fluid management Vomiting Unclear etiology, likely contributing to hypovolemia.  Possible infectious source, will follow closely.  No nausea today. 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  Amputation with VVS this Thursday on 5/8 Consult VVS for recommendation if still in hospital 5/8 T2DM (type 2 diabetes mellitus) (HCC) Last A1c 7.7 on 02/10/24.  Glucose 375, 335, 295. Ddx hyperglycemia includes infection, medication nonadherence, under-optimized insulin , poor diet.  Home regimen: Lantus  20 units daily, linagliptin  5 mg daily - CBGs ACHS - vsSSI - Restart home glargine 20 units daily - Monitor glucose levels Malnutrition (HCC) Suspect malnutrition given exam and poor PO intake as well as hypokalemia. - Restart home Nepro Carb Steady feeding supplement BID - RD consult Chronic health problem Hyperlipidemia: Continue atorvastatin  80 mg nightly Diabetic neuropathy: Continue gabapentin  100 mg BID Hx NSTEMI, [name]: Continue DAPT, s/p coronary artery stent, chronic stable hgb Dry eyes: artificial tears daily as needed   FEN/GI: Renal carb modified diet with 1200 mL fluid restriction  PPx: Heparin  TID  Dispo:Home pending clinical improvement . Barriers include hypotension and rule out infection.   Subjective:  Patient reports that she is doing fine.  Reports that she is adherent to medications and that her daughter gives them to her.  Reports that she had been vomiting for about a week prior to this presentation.  Reports that she does not drink a lot of water at home.  Denies any other concerns.  Objective: Temp:  [97.9 F (36.6 C)-98.1 F (36.7 C)] 98.1 F (36.7 C) (05/05 1139) Pulse Rate:  [56-83] 56 (05/05 1115) Resp:  [12-19] 13 (05/05 1115) BP: (80-113)/(52-70) 103/60 (05/05 1115)  SpO2:  [94 %-100 %] 98 % (05/05 1115) Weight:  [75.3 kg] 75.3 kg (05/05 0140) Physical Exam: General: Calm and  cooperative.  Laying comfortably in bed.  Frail.  Not acute distress.  No one at bedside. Cardiovascular: Normal heart sounds.  Normal rate and rhythm. Respiratory: Normal effort.  Clear to auscultation bilaterally. Abdomen: Bowel sounds present.  Soft and nontender.  Nondistended.  Peritoneal dialysis catheter in place and incision is clean.  No obvious third spaced fluid in the abdomen or fluid wave. Extremities: Trace bilateral lower extremity edema.  Laboratory: Most recent CBC Lab Results  Component Value Date   WBC 14.5 (H) 02/29/2024   HGB 18.0 (H) 02/29/2024   HCT 53.0 (H) 02/29/2024   MCV 91.9 02/29/2024   PLT 242 02/29/2024   Most recent BMP    Latest Ref Rng & Units 02/29/2024    5:16 AM  BMP  Glucose 70 - 99 mg/dL 161   BUN 8 - 23 mg/dL 21   Creatinine 0.96 - 1.00 mg/dL 0.45   Sodium 409 - 811 mmol/L 132   Potassium 3.5 - 5.1 mmol/L 2.7   Chloride 98 - 111 mmol/L 92   CO2 22 - 32 mmol/L 22   Calcium  8.9 - 10.3 mg/dL 8.1     Lactate 9.1->4.7->8.2 BNP 270 GI pathogen panel not collected Peritoneal fluid culture not collected Blood culture: negative 12 hours.    Verdell Given, MD 02/29/2024, 1:01 PM  PGY-1, [facility] Family Medicine FPTS Intern pager: [phone], text pages welcome Secure chat group [facility] Teaching Service

## 2024-02-29 NOTE — Sepsis Progress Note (Signed)
 Elink following for sepsis protocol.

## 2024-02-29 NOTE — ED Notes (Signed)
 Unable to obtain IV access or labs, phlebotomy at bedside for lab access, IV team order placed to obtain IV access.

## 2024-02-29 NOTE — Assessment & Plan Note (Addendum)
 Possible peritoneal catheter may need to be removed. Slight concern for infection of the peritoneal catheter given discharge and leukocytosis. Also concerned that patient may not be able to tolerate dialysis if hypotension persists, which would be poor prognostic factor. - Midodrine  as above - Renal diet with 1200 mL fluid restriction - Strict I&Os, daily weights - AM RFP - Peritoneal dialysis management per nephrology - Consider PD catheter culture if possible in AM - Nephrology consulted in ED, will see patient AM; follow-up recs

## 2024-02-29 NOTE — Progress Notes (Signed)
 Pre-Pd Vitals

## 2024-02-29 NOTE — Evaluation (Signed)
 Physical Therapy Evaluation Patient Details Name: Melissa Howe MRN: [id number] DOB: 1950-12-21 Today's Date: 02/29/2024  History of Present Illness  Pt is a 73 y/o F admitted on 02/29/24 after presenting with c/o vomiting & brief, resolved period of altered consciousness, & hyperglycemia. Pt is being treated for hypotension 2/2 overdialysis vs dehydration. PMH: ESRD, peripheral neuropathy 2/2 DM2, HFpEF, [name], depression, HLD, NSTEMI  Clinical Impression  Pt seen for PT evaluation with pt agreeable to tx. Pt reports she resides with her daughter who works outside of the home during the day. Pt stays on the 2nd level of the house, crawling up the stairs with daughter providing supervision. Pt ambulates short distance around upstairs with RW & has HHPT services out to the house. Pt denies falls but notes her balance has worsened over the past few months. On this date, pt is limited by low BP with positional changes, pt denies adverse symptoms. Pt is able to stand EOB & take a few steps to the R with HHA & min assist without LOB. Will continue to follow pt acutely to progress gait as able.   BP checked in RUE:  BP  Semi fowler  102/66 mmHg MAP 79  Sitting 78/52 mmHg MAP 62  Rechecked in sitting 80/59 mmHg MAP 67  Sitting after standing EOB 72/50 mmHg MAP 55  Semi fowler in bed 102/60 mmHg MAP 73           If plan is discharge home, recommend the following: A little help with walking and/or transfers;A little help with bathing/dressing/bathroom;Assistance with cooking/housework;Assist for transportation;Help with stairs or ramp for entrance   Can travel by private vehicle        Equipment Recommendations None recommended by PT  Recommendations for Other Services       Functional Status Assessment Patient has had a recent decline in their functional status and demonstrates the ability to make significant improvements in function in a reasonable and predictable amount of time.      Precautions / Restrictions Precautions Precautions: Fall Recall of Precautions/Restrictions: Intact Restrictions Weight Bearing Restrictions Per Provider Order: No      Mobility  Bed Mobility Overal bed mobility: Needs Assistance Bed Mobility: Supine to Sit, Sit to Supine     Supine to sit: Supervision, HOB elevated, Used rails Sit to supine: Supervision, HOB elevated, Used rails        Transfers Overall transfer level: Needs assistance Equipment used: 1 person hand held assist Transfers: Sit to/from Stand Sit to Stand: Min assist           General transfer comment: STS from ED stretcher with LUE HHA    Ambulation/Gait Ambulation/Gait assistance:  (Pt able to take two steps to R with HHA, min assist, further gait deferred 2/2 low BP)                Stairs            Wheelchair Mobility     Tilt Bed    Modified [place] (Stroke Patients Only)       Balance Overall balance assessment: Needs assistance Sitting-balance support: Feet supported, No upper extremity supported Sitting balance-Leahy Scale: Good Sitting balance - Comments: supervision sitting EOB   Standing balance support: During functional activity, Single extremity supported Standing balance-Leahy Scale: Fair                               Pertinent Vitals/Pain  Pain Assessment Pain Assessment: No/denies pain    Home Living Family/patient expects to be discharged to:: Private residence Living Arrangements: Children Available Help at Discharge: Family;Available PRN/intermittently Type of Home: House Home Access: Stairs to enter Entrance Stairs-Rails: None Entrance Stairs-Number of Steps: 1 (threshold) Alternate Level Stairs-Number of Steps: Flight Home Layout: Two level;Bed/bath upstairs;Able to live on main level with bedroom/bathroom;Full bath on main level Home Equipment: Grab bars - tub/shower;Rolling Walker (2 wheels);Hand held shower head;Tub bench;Rollator  (4 wheels) Additional Comments: has rollator downstairs    Prior Function               Mobility Comments: Tries to come downstairs every other day. Daughter provides supervision/assist when pt descend stairs, pt crawls up the stairs with daughter providing supervision. Ambulatory on 2nd level of home with RW, typically gets OOB on her own but has required more assistance "lately" (~past week), PRN assistance to get in bed 2/2 difficulty lifting legs. Walks to bathroom & back. Denies falls in the past 6 months. ADLs Comments: Reports she bathes & dresses herself, daughter assists pt with getting in/out of tub.     Extremity/Trunk Assessment   Upper Extremity Assessment Upper Extremity Assessment: Generalized weakness    Lower Extremity Assessment Lower Extremity Assessment: Generalized weakness       Communication   Communication Communication: No apparent difficulties    Cognition Arousal: Alert Behavior During Therapy: WFL for tasks assessed/performed   PT - Cognitive impairments: No apparent impairments                       PT - Cognition Comments: pleasant, agreeable to tx Following commands: Intact       Cueing Cueing Techniques: Verbal cues     General Comments      Exercises     Assessment/Plan    PT Assessment Patient needs continued PT services  PT Problem List Decreased strength;Decreased activity tolerance;Decreased balance;Decreased mobility       PT Treatment Interventions DME instruction;Gait training;Stair training;Functional mobility training;Therapeutic activities;Therapeutic exercise;Balance training;Patient/family education;Neuromuscular re-education    PT Goals (Current goals can be found in the Care Plan section)  Acute Rehab PT Goals Patient Stated Goal: get better, go home PT Goal Formulation: With patient Time For Goal Achievement: 03/14/24 Potential to Achieve Goals: Good    Frequency Min 2X/week      Co-evaluation               AM-PAC PT "6 Clicks" Mobility  Outcome Measure Help needed turning from your back to your side while in a flat bed without using bedrails?: None Help needed moving from lying on your back to sitting on the side of a flat bed without using bedrails?: A Little Help needed moving to and from a bed to a chair (including a wheelchair)?: A Little Help needed standing up from a chair using your arms (e.g., wheelchair or bedside chair)?: A Little Help needed to walk in hospital room?: A Little Help needed climbing 3-5 steps with a railing? : A Lot 6 Click Score: 18    End of Session   Activity Tolerance: Treatment limited secondary to medical complications (Comment) (limited 2/2 low BP) Patient left: in bed;with call bell/phone within reach   PT Visit Diagnosis: Muscle weakness (generalized) (M62.81);Other abnormalities of gait and mobility (R26.89);Unsteadiness on feet (R26.81)    Time: 1610-9604 PT Time Calculation (min) (ACUTE ONLY): 19 min   Charges:   PT Evaluation $PT Eval Moderate  Complexity: 1 Mod   PT General Charges $$ ACUTE PT VISIT: 1 Visit         Emaline Handsome, PT, DPT 02/29/24, 11:02 AM   Venetta Gill 02/29/2024, 10:59 AM

## 2024-02-29 NOTE — Assessment & Plan Note (Deleted)
 Noted on admission, persisted on recheck with hypomagnesemia.  Is on 20 mEq daily at home, but did not restart inpatient in setting of dialysis, will exercise caution with electrolyte management. - 40 mEq Kcl and 2 g Mag now and follow up Nephrology plan for electrolytes

## 2024-02-29 NOTE — Assessment & Plan Note (Signed)
 Unclear etiology, likely contributing to hypovolemia.  Possible infectious source, will follow closely. - Enteric precautions, GI pathogen panel - Ondansetron  4 mg Q6h PRN

## 2024-02-29 NOTE — Assessment & Plan Note (Signed)
 Likely elevated in setting of poor tissue perfusion, possibly infection.  Improved with Abx and 500 mL NS bolus. - Repeat lactate in 3 hours, continue to trend as needed - Consider repeat bolus depending on volume status and exam

## 2024-02-29 NOTE — Assessment & Plan Note (Signed)
 Suspect malnutrition given exam and poor PO intake as well as hypokalemia. - Restart home Nepro Carb Steady feeding supplement BID - RD consult

## 2024-02-29 NOTE — Assessment & Plan Note (Addendum)
 Last A1c 7.7 on 02/10/24.  Glucose 375, 335, 295. Ddx hyperglycemia includes infection, medication nonadherence, under-optimized insulin , poor diet.  Home regimen: Lantus  20 units daily, linagliptin  5 mg daily - CBGs ACHS - vsSSI - Restart home glargine 20 units daily - Monitor glucose levels

## 2024-02-29 NOTE — Assessment & Plan Note (Signed)
 Hyperlipidemia: Continue atorvastatin  80 mg nightly Diabetic neuropathy: Continue gabapentin  100 mg BID Hx NSTEMI, [name]: Continue DAPT (aspirin , Plavix ); need to revisit duration of therapy Dry eyes: Daily PRN both eyes

## 2024-02-29 NOTE — H&P (Addendum)
 Hospital Admission History and Physical Service Pager: [phone]  Patient name: Melissa Howe Medical record number: [id number] Date of Birth: 1951/05/08 Age: 73 y.o. Gender: female  Primary Care Provider: Shannan Dart., FNP Consultants: Nephrology Code Status: Full Code Preferred Emergency Contact: Contact Information     Name Relation Home Work Mobile   Evans,Crystal Daughter [phone]      Other Contacts     Name Relation Home Work Mobile   [place] Brother   [phone]      Chief Complaint: Vomiting, diarrhea  Assessment and Plan: Melissa Howe is a 73 y.o. female presenting with vomiting and brief resolved period of altered consciousness as well as a hyperglycemia.  Differential for this patient's presentation includes hypotension secondary to dehydration (due to vomiting, overdialysis, and/or poor PO intake), peritoneal catheter associated infection, and DKA.  Presentation is not concerning for sepsis given MAPs >65, no fevers, stable leukocytosis, and normal platelets, though elevated lactate is notable.  Suspect the patient has worsening baseline hypotension and may be struggling to tolerate dialysis given soft BPs now on midodrine .  It is possible that she is hypovolemic secondary to overdialysis and/or dehydration in setting of vomiting and may benefit from additional fluids.  Hypotension could account for patient's weakness, period of somnolence.  Ultimately, the patient has a very difficult fluid balance given her ESRD and CHF with borderline hypotension.  She is status post 500 mL NS bolus and will need to exercise caution with further fluids.  It is notable that the patient's lactate improved with fluid bolus, will move forward with additional 250 mL bolus.  Will obtain BNP, but saw no evidence of volume overload on exam.  Do suspect that patient's blood pressure cuff is too large for wrist, will attempt to find better  fitting cuff for more accurate measurements.  Do not see indication for CCM consult at this time, however patient remains persistently hypotensive may need to consult them and consider pressors.  It is still important not to miss infection and there is increased drainage from peritoneal dialysis site, will continue broad-spectrum antibiotics.  The patient does not have any fevers, but lactate is notably elevated and patient has had persistent leukocytosis which could indicate systemic inflammation.  Reassuringly, does not have any signs of peritonitis on exam.  Did note the patient's vomiting and reported diarrhea, which could be contributing to hypotension or due to underlying infection, will initiate precautions and obtain GI pathogen panel.  No evidence of pneumonia on exam or CXR and do not suspect UTI.  Less likely DKA given no acidosis and anion gap elevation suspected secondary to elevated lactate as well as ESRD.  It is concerning that the patient has multiple chronic illnesses and comorbid conditions in setting of ESRD and may face poor prognosis if she becomes increasingly hypotensive and unable to tolerate dialysis in the future.  Will pursue a goals of care conversation as appropriate this admission. Assessment & Plan Hypotension Considering hypovolemia secondary to overdialysis versus dehydration.  Possible contribution from infection but no concern for sepsis.  Need to ensure appropriate sized blood pressure cuff and technique for accurate measurements. - Admit to FMTS inpatient, Progressive unit, attending Dr. Violetta Grice - Restart home midodrine  10 mg TID w/ meals - s/p 500 mL bolus in ED, additional 250 mL LR and reevaluate volume status in AM - s/p cefepime , metronidazole , vancomycin ; consider redosing in AM pending blood cultures - Follow BCx - Delirium precautions -  PT/OT eval and treat - follow up BNP - Goal MAP >65, monitor closely ESRD on peritoneal dialysis [facility]) Possible  peritoneal catheter may need to be removed. Slight concern for infection of the peritoneal catheter given discharge and leukocytosis. Also concerned that patient may not be able to tolerate dialysis if hypotension persists, which would be poor prognostic factor. - Midodrine  as above - Renal diet with 1200 mL fluid restriction - Strict I&Os, daily weights - AM RFP - Peritoneal dialysis management per nephrology - Consider PD catheter culture if possible in AM - Nephrology consulted in ED, will see patient AM; follow-up recs High serum lactate Likely elevated in setting of poor tissue perfusion, possibly infection.  Improved with Abx and 500 mL NS bolus. - Repeat lactate in 3 hours, continue to trend as needed - Consider repeat bolus depending on volume status and exam Vomiting Unclear etiology, likely contributing to hypovolemia.  Possible infectious source, will follow closely. - Enteric precautions, GI pathogen panel - Ondansetron  4 mg Q6h PRN Hypokalemia Noted on admission, persisted on recheck with hypomagnesemia.  Is on 20 mEq daily at home, but did not restart inpatient in setting of dialysis, will exercise caution with electrolyte management. 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  Amputation with VVS this Thursday on 5/8, may need to consult inpatient if remains hospitalized through that date depending on clinical status. T2DM (type 2 diabetes mellitus) (HCC) Last A1c 7.7 on 02/10/24.  Notably hyperglycemic on admission, possibly in setting of infection, no concern for DKA. Home regimen: Lantus  20 units daily, linagliptin  5 mg daily - CBGs ACHS - vsSSI - Restart home glargine 20 units daily Malnutrition (HCC) Suspect malnutrition given exam and poor PO intake as well as hypokalemia. - Restart home Nepro Carb Steady feeding supplement BID - RD consult Chronic health problem Hyperlipidemia: Continue  atorvastatin  80 mg nightly Diabetic neuropathy: Continue gabapentin  100 mg BID Hx NSTEMI, [name]: Continue DAPT (aspirin , Plavix ); need to revisit duration of therapy Dry eyes: Daily PRN both eyes  FEN/GI: Renal carb modified diet with 1200 mL fluid restriction VTE Prophylaxis: Heparin  TID  Disposition: Progressive  History of Present Illness: Melissa Howe is a 73 y.o. female with a pertinent PMH of  HTN, HLD, [name] s/p stents, NSTEMI, CHF, ESRD on home peritoneal dialysis TID presenting with intolerance of PD, hypotension, and hyperglycemia.  Patient was previously admitted to FMTS from 4/15-4/18 for hypotension, at which time midodrine  was titrated up to 10 mg TID and Imdur  was discontinued.  Patient was worked up for sepsis, and it was ruled out after 3 day course of broad spectrum antibiotics and negative BCx except contaminant.  RPP showed rhinoenterovirus at the time.  On 4/28, the patient went to PCP, who sent her to ED for hypotension, where she got fluids for hypotension and was discharged home.  At midnight today, the patient's daughter woke her up around midnight for scheduled peritoneal dialysis, but patient was significantly somnolent with hyperglycemia and daughter called EMS.  In the ED, patient was stable without tachycardia but with soft BP.  Did not meet SIRS criteria and MAP remained above 65 consistently.  Leukocytosis present, though stable from discharge on 4/18.  Lactate notably elevated to 3.8.  Received 500 mL fluid bolus, exercising caution given history ESRD and HFpEF.  Blood cultures drawn and started on broad spectrum antibiotics with cefepime , vancomycin , and metronidazole .  Nephrology consulted in ED and will see patient in AM.  FMTS consulted for admission for possible sepsis.  On FMTS evaluation, patient is back to neurological baseline per patient and daughter.  She is alert to self, place, month, and situation.  Reports she is tired and uncomfortable, but denies  any pain.  Patient usually receives PD at 8 am, 4 pm, and 12 am.  BP has been around systolic 88 and she is taking her midodrine  TID as prescribed at discharge.  She has been vomiting approximately twice daily since most recent hospitalization 3 weeks ago.  Zofran  has not been helping with vomiting.  Emesis approximately 1 hour after eating/drinking.  The patient also had 3 liquid BMs on Sunday, and one on Saturday without blood.  Vomit is clear liquid.  Drainage from peritoneal catheter is also reportedly clear, not cloudy.  The patient has not had any fevers or abdominal pain.  She is anuric at baseline and denies vaginal/urethral burning, itching, pain.  Daughter has been proactively encouraging fluid hydration.   Overall, the patient is unchanged from the last 2 weeks, but daughter is worried about hypotension, hyperglycemia, and somnolence overnight.  Review Of Systems: Per HPI.  Pertinent Past Medical History: ESRD on peritoneal dialysis TID Peripheral neuropathy 2/2 T2DM HFpEF Gangrene of left 5th digit Anemia of chronic disease [name] T2DM Depression Diabetic macular edema HLD Hx MSSA bacteremia NSTEMI  Remainder reviewed in history tab.  Pertinent Past Surgical History: Cardiac stent placement L brachiocephalic fistula placement and tying off due to gangrene as above Stents placed in legs Appendectomy  Remainder reviewed in history tab.  Pertinent Social History: Tobacco use: Former, 40 years ago, 7.5 ppd Alcohol  use: No Other Substance use: No Lives with daughters who takes care of her  Pertinent Family History: Mother: dementia, DM2, HTN  Remainder reviewed in history tab.   Important Outpatient Medications: All medications last taken yesterday. Aspirin  81 mg daily Atorvastatin  80 mg QHS Lipitor  80 mg nightly Calcitriol  0.5 mcg daily Cinacalcet  30 mg daily Clopidogrel  75 mg daily Gabapentin  100 mg twice daily Liragliptin 5 mg daily Clear Eyes complete 1  drop both eyes PRN Glargine 20 units every morning Magnesium  500 mg twice weekly Midodrine  10 mg TID with meals Ondansetron  4 mg Q8h PRN Tylenol  650 every 6 hours as needed  Imdur  discontinued last admission, not taking now  Remainder reviewed in medication history.   Objective: BP 113/61   Pulse 82   Temp 98.1 F (36.7 C) (Oral)   Resp 18   Ht 5\' 4"  (1.626 m)   Wt 75.3 kg   SpO2 99%   BMI 28.49 kg/m   Physical Exam: General: Chronically ill appearing, deconditioned.  Resting in bed, NAD.  Alert to self, place, month, situation. Eyes: PERRLA, no scleral icterus or injections. ENTM: Dry lips, no oral lesions. Cardiovascular: Regular rate, irregular rhythm.  No m/r/g. Respiratory: CTAB.  Normal WOB on room air. Gastrointestinal: No TTP in all quadrants, no rebound or guarding.  Soft, nondistended abdomen without fluid wave.  Mild dried serosanguinous drainage from PD catheter ostomy site on bandage, no surrounding edema or erythema, catheter firmly in place. MSK: Deconditioned, but appropriate movement of all extremities.  Sits up in bed with assistance. Extremities: No peripheral edema bilaterally, no lesions or ulcers on feet.  Dry gangrene of L 5th digit without edema or erythema.  Capillary refill 3+ seconds, though difficult to assess. Derm: No petechiae or rashes grossly. Neuro: A&Ox4, follows instructions and  converses appropriately.  Labs:  CBC BMET  Recent Labs  Lab 02/29/24 0205 02/29/24 0222  WBC 14.5*  --   HGB 16.3* 18.0*  HCT 49.9* 53.0*  PLT 242  --    Recent Labs  Lab 02/29/24 0205 02/29/24 0222  NA 135 133*  K 2.7* 3.2*  CL 92*  --   CO2 24  --   BUN 21  --   CREATININE 7.60*  --   GLUCOSE 375*  --   CALCIUM  8.6*  --      Pertinent additional labs: -Lactate 3.8 -VBG WNL except O2 mildly low at 27 -Blood cultures drawn  EKG: ?Sinus arrhythmia given irregularity, otherwise unchanged from prior April 28th.  Possible partial RBBB.  No  ischemic changes.  QTcB 440.  Imaging Studies: -CXR: Some hypoinflation, but no focal findings and unchanged from April 15th.  Independently reviewed and agree with radiologist's interpretation.  [place] Planas, Dimitry, MD 02/29/2024, 3:30 AM PGY-1, [facility]  FPTS Intern pager: [phone], text pages welcome Secure chat group [facility]  Upper Level Addendum: I have seen and evaluated this patient along with Dr. [place]ing Planas and reviewed the above note, making necessary revisions as appropriate. I agree with the medical decision making and physical exam as noted above. Wilhemena Harbour, MD PGY-3 [facility] Residency

## 2024-02-29 NOTE — Assessment & Plan Note (Addendum)
 Unclear etiology, likely contributing to hypovolemia.  Possible infectious source, will follow closely.  No nausea today. - Enteric precautions, GI pathogen panel - Ondansetron  4 mg Q6h PRN

## 2024-02-29 NOTE — Progress Notes (Signed)
 Pharmacy Antibiotic Note  Melissa Howe a 73 y.o. female admitted on 02/29/2024 with hypotension and started empiric therapy for intra-abdominal infection. Pharmacy has been consulted for vancomycin  and cefepime  dosing.  Assessment: Patient is afebrile and WBC are trending down, but consistently elevated. Lactic acid trending down. ESRD on CAPD outpatient, CCPD inpatient. Bcx pending. Loading doses given of vanc 1500 mg (5/5 0331), cefepime  2g (5/5 0247), flagyl  500 mg (5/5 0316)   Plan: Cefepime  1 g IV q24hr Vancomycin  level in 3-5 days after LD Monitor clinical progress, cultures/sensitives F/u LOT and de-escalate as able   Height: 5\' 4"  (162.6 cm) Weight: 75.3 kg (166 lb 0.1 oz) IBW/kg (Calculated) : 54.7  Temp (24hrs), Avg:98 F (36.7 C), Min:97.9 F (36.6 C), Max:98.1 F (36.7 C)  Recent Labs  Lab 02/22/24 1118 02/29/24 0205 02/29/24 0228 02/29/24 0433 02/29/24 0516  WBC 19.9* 14.5*  --   --   --   CREATININE 8.32* 7.60*  --   --  7.37*  LATICACIDVEN  --   --  3.8* 3.1*  --     Estimated Creatinine Clearance: 6.8 mL/min (A) (by C-G formula based on SCr of 7.37 mg/dL (H)).    No Known Allergies  Antimicrobials this admission: Cefepime  5/5 >>  Vancomycin  5/5 >> Flagyl  x 1 5/5  Dose adjustments this admission:  Microbiology results: 5/5 BCx: pending  Thank you for allowing pharmacy to be a part of this patient's care.  Rockie Churchman 02/29/2024 10:25 AM

## 2024-02-29 NOTE — Progress Notes (Signed)
 Peritoneal catheter assessed. No drainage present. Dressing is clean dry an intact.Temperature is 97.1. No dressing change at this time. 500cc of clear yellow fluid  initially drained. 1000 cc of 2.5  delineal fluid instilled. Dwell time is 2 hours. Patient has no voiced concerns. Process explained to patient.

## 2024-02-29 NOTE — Assessment & Plan Note (Signed)
 Last A1c 7.7 on 02/10/24.  Notably hyperglycemic on admission, possibly in setting of infection, no concern for DKA. Home regimen: Lantus  20 units daily, linagliptin  5 mg daily - CBGs ACHS - vsSSI - Restart home glargine 20 units daily

## 2024-02-29 NOTE — ED Triage Notes (Signed)
 Patient BIB [facility] for symptomatic hypotension, states hx of same after peritoneal dialysis treatments, states she became hypotensive tonight after treatment, 500 mL IVF infused PTA, BGL 423 en route.

## 2024-02-29 NOTE — Assessment & Plan Note (Addendum)
 Suspect malnutrition given exam and poor PO intake as well as hypokalemia. - Restart home Nepro Carb Steady feeding supplement BID - RD consult

## 2024-02-29 NOTE — ED Notes (Signed)
 Phlebotomy at bedside for repeat lactic acid draw.

## 2024-02-29 NOTE — Assessment & Plan Note (Addendum)
 Lactate 3.8->3.1->2.3 status post ~1 L fluids.  Likely elevated in setting of poor tissue perfusion, possibly infection.  - Follow-up lactate trend - Defer to nephrology for fluid management

## 2024-02-29 NOTE — Hospital Course (Addendum)
 Melissa Howe is a 73 y.o.female with a history of ESRD on dialysis, T2DM on insulin , hypotension on midodrine   who was admitted to the [facility] Teaching Service at [facility] for hypotension. Her hospital course is detailed below:  Hypotension MAP > 65*** on admission / throughout hospitalization. Ddx over-dialysizing, dehydration, infection, fluid shift including peritoneum, autonomic dysfunction, midodrine  non adherence. Reported vomiting for week prior to admission. Nephrology ***.   ESRD on peritoneal dialysis Received dialysis per usual in hospital.   Other chronic conditions were medically managed with home medications and formulary alternatives as necessary (diabetes, diabetic neuropathy, hyperlipidemia, [name], dry eyes, dry gangrene)  PCP Follow-up Recommendations:

## 2024-02-29 NOTE — Consult Note (Signed)
 [facility]  INPATIENT CONSULTATION  Reason for Consultation: ESRD PD patient  Requesting Provider: Dr Andree Kayser  HPI: Melissa Howe is an 73 y.o. 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   Pt recently admitted 4/15-4/18 for hypotension.  W/u was unrevealing and she was resumed on midodrine  and d/c'd.   She presented via EMS with a several day history of N/V/D and hypotension.  Notes syncope with EMS.  She has a poor appetite and GERD but denies any dysphagia, odynophagia.  She denies depression.    She denies any recent issues with her CAPD.  Denies any breaches in technique, cloudy fluid, abd pain.  Did HD for a few years and did not like it.  Has been doing CAPD for ~1mo with help of her daughter and wishes to continue.    PMH: Past Medical History:  Diagnosis Date   Anemia    CHF (congestive heart failure) (HCC)    Constipation    Coronary artery disease    Diabetes mellitus    Type II   Dyspnea    with exertion   ESRD (end stage renal disease) (HCC)    dialyzing 3 times a day with peritoneal dialysis catheter that was placed in 04/2023   History of blood transfusion    Hyperlipemia    Hypertension    Ischemic cardiomyopathy    Myocardial infarction [facility])    Peripheral vascular disease (HCC)    Pneumonia 2019   PSH: Past Surgical History:  Procedure Laterality Date   ABDOMINAL AORTOGRAM W/LOWER EXTREMITY N/A 10/15/2022   Procedure: ABDOMINAL AORTOGRAM W/LOWER EXTREMITY;  Surgeon: Kayla Part, MD;  Location: [facility] INVASIVE CV LAB;  Service: Cardiovascular;  Laterality: N/A;   ABDOMINAL AORTOGRAM W/LOWER EXTREMITY N/A 11/30/2023   Procedure: ABDOMINAL AORTOGRAM W/LOWER EXTREMITY;  Surgeon: Adine Hoof, MD;  Location: [facility] INVASIVE CV LAB;  Service: Cardiovascular;  Laterality: N/A;   APPENDECTOMY     AV FISTULA PLACEMENT Left 10/05/2019    Procedure: INSERTION OF ARTERIOVENOUS (AV) GORE-TEX VASCULAR GRAFT LEFT ARM;  Surgeon: Mayo Speck, MD;  Location: [facility] OR;  Service: Vascular;  Laterality: Left;   AV FISTULA PLACEMENT Left 02/27/2021   Procedure: LEFT ARM FIRST STAGE BASILIC VEIN  ARTERIOVENOUS (AV) FISTULA CREATION;  Surgeon: Adine Hoof, MD;  Location: [facility] OR;  Service: Vascular;  Laterality: Left;   BASCILIC VEIN TRANSPOSITION Left 04/17/2021   Procedure: LEFT SECOND STAGE BASCILIC VEIN TRANSPOSITION;  Surgeon: Adine Hoof, MD;  Location: [facility] OR;  Service: Vascular;  Laterality: Left;   BIOPSY  07/24/2020   Procedure: BIOPSY;  Surgeon: Albertina Hugger, MD;  Location: [facility] ENDOSCOPY;  Service: Gastroenterology;;   BUBBLE STUDY  01/02/2020   Procedure: BUBBLE STUDY;  Surgeon: Euell Herrlich, MD;  Location: [facility] ENDOSCOPY;  Service: Cardiology;;   CAPD INSERTION N/A 05/01/2023   Procedure: LAPAROSCOPIC INSERTION CONTINUOUS AMBULATORY PERITONEAL DIALYSIS  (CAPD) CATHETER;  Surgeon: Adine Hoof, MD;  Location: [facility] OR;  Service: Vascular;  Laterality: N/A;   CARDIAC CATHETERIZATION     COLONOSCOPY WITH PROPOFOL  N/A 07/24/2020   Procedure: COLONOSCOPY WITH PROPOFOL ;  Surgeon: Albertina Hugger, MD;  Location: [facility] ENDOSCOPY;  Service: Gastroenterology;  Laterality: N/A;   CORONARY STENT INTERVENTION N/A 07/12/2021   Procedure: CORONARY STENT INTERVENTION;  Surgeon: Lucendia Rusk, MD;  Location: [facility] INVASIVE CV LAB;  Service: Cardiovascular;  Laterality: N/A;  CORONARY ULTRASOUND/IVUS N/A 07/12/2021   Procedure: Intravascular Ultrasound/IVUS;  Surgeon: Lucendia Rusk, MD;  Location: [facility] INVASIVE CV LAB;  Service: Cardiovascular;  Laterality: N/A;   ESOPHAGOGASTRODUODENOSCOPY (EGD) WITH PROPOFOL  N/A 07/24/2020   Procedure: ESOPHAGOGASTRODUODENOSCOPY (EGD) WITH PROPOFOL ;  Surgeon: Albertina Hugger, MD;  Location: [facility] ENDOSCOPY;  Service: Gastroenterology;  Laterality: N/A;   EYE  SURGERY Bilateral    Cataract   FOREIGN BODY REMOVAL Left 05/13/2018   Procedure: FOREIGN BODY REMOVAL left foot;  Surgeon: Jasmine Mesi, MD;  Location: [facility] OR;  Service: Orthopedics;  Laterality: Left;   I & D EXTREMITY Left 05/21/2018   Procedure: LEFT FOOT DEBRIDEMENT AND WOUND CLOSURE;  Surgeon: Timothy Ford, MD;  Location: [facility] OR;  Service: Orthopedics;  Laterality: Left;   LEFT HEART CATH AND CORONARY ANGIOGRAPHY N/A 07/12/2021   Procedure: LEFT HEART CATH AND CORONARY ANGIOGRAPHY;  Surgeon: Lucendia Rusk, MD;  Location: [facility] INVASIVE CV LAB;  Service: Cardiovascular;  Laterality: N/A;   LIGATION OF ARTERIOVENOUS  FISTULA Left 01/15/2024   Procedure: LIGATION OF ARTERIOVENOUS  FISTULA;  Surgeon: Adine Hoof, MD;  Location: [facility] OR;  Service: Vascular;  Laterality: Left;   PERIPHERAL VASCULAR BALLOON ANGIOPLASTY  11/30/2023   Procedure: PERIPHERAL VASCULAR BALLOON ANGIOPLASTY;  Surgeon: Adine Hoof, MD;  Location: [facility] INVASIVE CV LAB;  Service: Cardiovascular;;  left AT   PERIPHERAL VASCULAR INTERVENTION  11/30/2023   Procedure: PERIPHERAL VASCULAR INTERVENTION;  Surgeon: Adine Hoof, MD;  Location: [facility] INVASIVE CV LAB;  Service: Cardiovascular;;  stent left AT   POLYPECTOMY  07/24/2020   Procedure: POLYPECTOMY;  Surgeon: Albertina Hugger, MD;  Location: [facility] ENDOSCOPY;  Service: Gastroenterology;;   TEE WITHOUT CARDIOVERSION N/A 01/02/2020   Procedure: TRANSESOPHAGEAL ECHOCARDIOGRAM (TEE);  Surgeon: Euell Herrlich, MD;  Location: [facility] ENDOSCOPY;  Service: Cardiology;  Laterality: N/A;   TONSILLECTOMY     TUBAL LIGATION     UPPER EXTREMITY ANGIOGRAPHY Left 11/30/2023   Procedure: Upper Extremity Angiography;  Surgeon: Adine Hoof, MD;  Location: [facility] INVASIVE CV LAB;  Service: Cardiovascular;  Laterality: Left;   Past Medical History:  Diagnosis Date   Anemia    CHF (congestive heart failure) (HCC)    Constipation    Coronary  artery disease    Diabetes mellitus    Type II   Dyspnea    with exertion   ESRD (end stage renal disease) (HCC)    dialyzing 3 times a day with peritoneal dialysis catheter that was placed in 04/2023   History of blood transfusion    Hyperlipemia    Hypertension    Ischemic cardiomyopathy    Myocardial infarction [facility])    Peripheral vascular disease (HCC)    Pneumonia 2019    Medications:  I have reviewed the patient's current medications.  (Not in a hospital admission)   ALLERGIES:  No Known Allergies  FAM HX: Family History  Problem Relation Age of Onset   Hypertension Mother    Diabetes Mother    Hypertension Sister     Social History:   reports that she quit smoking about 40 years ago. Her smoking use included cigarettes. She started smoking about 44 years ago. She has never been exposed to tobacco smoke. She has never used smokeless tobacco. She reports that she does not drink alcohol  and does not use drugs.  ROS: 12 system ROS neg except per HPI  Blood pressure 103/60, pulse (!) 56, temperature 98.1 F (36.7 C), temperature  source Oral, resp. rate 13, height 5\' 4"  (1.626 m), weight 75.3 kg, SpO2 98%. PHYSICAL EXAM: Gen: tired but nontoxic appearing  Eyes: anicteric., EOMI ENT: MMM Neck: supple, no JVD CV:  SB, no rub Abd:  soft, nontender, LLQ PD catheter dressing intact, no drainage or tenderness Lungs: clear on RA Extr: no edema, L arm with clotted forearm AVG and ligated BC AVF.  Necrotic dry tip of L 5th finger (ischemic), no erythema or drainage Neuro: nonfocal Skin: no rashes   Results for orders placed or performed during the hospital encounter of 02/29/24 (from the past 48 hours)  Comprehensive metabolic panel     Status: Abnormal   Collection Time: 02/29/24  2:05 AM  Result Value Ref Range   Sodium 135 135 - 145 mmol/L   Potassium 2.7 (LL) 3.5 - 5.1 mmol/L    Comment: CRITICAL RESULT CALLED TO, READ BACK BY AND VERIFIED WITH Morrie Arbour RN [phone]  [phone] M. ALAMANO   Chloride 92 (L) 98 - 111 mmol/L   CO2 24 22 - 32 mmol/L   Glucose, Bld 375 (H) 70 - 99 mg/dL    Comment: Glucose reference range applies only to samples taken after fasting for at least 8 hours.   BUN 21 8 - 23 mg/dL   Creatinine, Ser 3.32 (H) 0.44 - 1.00 mg/dL   Calcium  8.6 (L) 8.9 - 10.3 mg/dL   Total Protein 6.8 6.5 - 8.1 g/dL   Albumin  2.3 (L) 3.5 - 5.0 g/dL   AST 18 15 - 41 U/L   ALT 16 0 - 44 U/L   Alkaline Phosphatase 115 38 - 126 U/L   Total Bilirubin 0.7 0.0 - 1.2 mg/dL   GFR, Estimated 5 (L) >60 mL/min    Comment: (NOTE) Calculated using the CKD-EPI Creatinine Equation (2021)    Anion gap 19 (H) 5 - 15    Comment: Performed at [facility], [address] [place], [place] [address]  CBC with Differential     Status: Abnormal   Collection Time: 02/29/24  2:05 AM  Result Value Ref Range   WBC 14.5 (H) 4.0 - 10.5 K/uL   RBC 5.43 (H) 3.87 - 5.11 MIL/uL   Hemoglobin 16.3 (H) 12.0 - 15.0 g/dL   HCT 41.6 (H) 60.6 - 30.1 %   MCV 91.9 80.0 - 100.0 fL   MCH 30.0 26.0 - 34.0 pg   MCHC 32.7 30.0 - 36.0 g/dL   RDW 60.1 09.3 - 23.5 %   Platelets 242 150 - 400 K/uL   nRBC 0.3 (H) 0.0 - 0.2 %   Neutrophils Relative % 75 %   Neutro Abs 10.8 (H) 1.7 - 7.7 K/uL   Lymphocytes Relative 16 %   Lymphs Abs 2.4 0.7 - 4.0 K/uL   Monocytes Relative 7 %   Monocytes Absolute 1.0 0.1 - 1.0 K/uL   Eosinophils Relative 1 %   Eosinophils Absolute 0.2 0.0 - 0.5 K/uL   Basophils Relative 0 %   Basophils Absolute 0.0 0.0 - 0.1 K/uL   Immature Granulocytes 1 %   Abs Immature Granulocytes 0.17 (H) 0.00 - 0.07 K/uL    Comment: Performed at [facility], [address] [place], [place] [address]  Protime-INR     Status: None   Collection Time: 02/29/24  2:05 AM  Result Value Ref Range   Prothrombin Time 13.4 11.4 - 15.2 seconds   INR 1.0 0.8 - 1.2    Comment: (NOTE) INR  goal varies based on device and disease states. Performed at [facility], [address], [place], [place] [address]   I-Stat venous blood gas, [facility] ED, [facility], [facility])     Status: Abnormal   Collection Time: 02/29/24  2:22 AM  Result Value Ref Range   pH, Ven 7.385 7.25 - 7.43   pCO2, Ven 46.1 44 - 60 mmHg   pO2, Ven 27 (LL) 32 - 45 mmHg   Bicarbonate 27.5 20.0 - 28.0 mmol/L   TCO2 29 22 - 32 mmol/L   O2 Saturation 48 %   Acid-Base Excess 2.0 0.0 - 2.0 mmol/L   Sodium 133 (L) 135 - 145 mmol/L   Potassium 3.2 (L) 3.5 - 5.1 mmol/L   Calcium , Ion 0.94 (L) 1.15 - 1.40 mmol/L   HCT 53.0 (H) 36.0 - 46.0 %   Hemoglobin 18.0 (H) 12.0 - 15.0 g/dL   Sample type VENOUS    Comment NOTIFIED PHYSICIAN   I-Stat Lactic Acid, ED     Status: Abnormal   Collection Time: 02/29/24  2:28 AM  Result Value Ref Range   Lactic Acid, Venous 3.8 (HH) 0.5 - 1.9 mmol/L   Comment NOTIFIED PHYSICIAN   Blood Culture (routine x 2)     Status: None (Preliminary result)   Collection Time: 02/29/24  2:36 AM   Specimen: BLOOD  Result Value Ref Range   Specimen Description BLOOD SITE NOT SPECIFIED    Special Requests      BOTTLES DRAWN AEROBIC AND ANAEROBIC Blood Culture adequate volume   Culture      NO GROWTH < 12 HOURS Performed at [facility], [address], [place], [place] [address]    Report Status PENDING   I-Stat Lactic Acid, ED     Status: Abnormal   Collection Time: 02/29/24  4:33 AM  Result Value Ref Range   Lactic Acid, Venous 3.1 (HH) 0.5 - 1.9 mmol/L   Comment NOTIFIED PHYSICIAN   Magnesium      Status: Abnormal   Collection Time: 02/29/24  5:12 AM  Result Value Ref Range   Magnesium  1.5 (L) 1.7 - 2.4 mg/dL    Comment: Performed at [facility], [address], [place], [place] [address]  Renal function panel     Status: Abnormal   Collection Time: 02/29/24  5:16 AM  Result Value Ref Range   Sodium 132 (L) 135 - 145 mmol/L   Potassium 2.7 (LL) 3.5 - 5.1 mmol/L    Comment: CRITICAL RESULT CALLED TO, READ BACK BY AND VERIFIED WITH Morrie Arbour RN [phone] M. ALAMANO    Chloride 92 (L) 98 - 111 mmol/L   CO2 22 22 - 32 mmol/L   Glucose, Bld 335 (H) 70 - 99 mg/dL    Comment: Glucose reference range applies only to samples taken after fasting for at least 8 hours.   BUN 21 8 - 23 mg/dL   Creatinine, Ser 6.96 (H) 0.44 - 1.00 mg/dL   Calcium  8.1 (L) 8.9 - 10.3 mg/dL   Phosphorus 3.2 2.5 - 4.6 mg/dL   Albumin  2.0 (L) 3.5 - 5.0 g/dL   GFR, Estimated 5 (L) >60 mL/min    Comment: (NOTE) Calculated using the CKD-EPI Creatinine Equation (2021)    Anion gap 18 (H) 5 - 15    Comment: Performed at [facility], [address] [place], [place] [address]  CBG monitoring, ED     Status: Abnormal   Collection  Time: 02/29/24  7:36 AM  Result Value Ref Range   Glucose-Capillary 295 (H) 70 - 99 mg/dL    Comment: Glucose reference range applies only to samples taken after fasting for at least 8 hours.  Brain natriuretic peptide     Status: Abnormal   Collection Time: 02/29/24 10:29 AM  Result Value Ref Range   B Natriuretic Peptide 270.6 (H) 0.0 - 100.0 pg/mL    Comment: Performed at [facility], [address], [place], [place] [address]  Lactic acid, plasma     Status: Abnormal   Collection Time: 02/29/24 10:29 AM  Result Value Ref Range   Lactic Acid, Venous 2.3 (HH) 0.5 - 1.9 mmol/L    Comment: CRITICAL RESULT CALLED TO, READ BACK BY AND VERIFIED WITH J.BLUE CHARGE NURSE @1148  05.05.2025 E.AHMED Performed at [facility], [address], [place], [place] [address]   CBG monitoring, ED     Status: Abnormal   Collection Time: 02/29/24 11:45 AM  Result Value Ref Range   Glucose-Capillary 199 (H) 70 - 99 mg/dL    Comment: Glucose reference range applies only to samples taken after fasting for at least 8 hours.    DG Chest Port 1 View Result Date: 02/29/2024 CLINICAL DATA:  Sepsis EXAM: PORTABLE CHEST 1 VIEW COMPARISON:  02/09/2024 FINDINGS: Cardiac shadow is within normal limits. Lungs are hypoinflated but clear. No bony abnormality is noted.  IMPRESSION: No active disease. Electronically Signed   By: Violeta Grey M.D.   On: 02/29/2024 03:24    Assessment/PlanJean Lakeitha Howe is an 73 y.o. 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   **hypotension, shock: Lactate 3.  In setting of poor po intake, N/V and improved with IVF.   Afebrile but with N/V/D - r/o GI infection.  R/o peritonitis - RN aware of cell count/differential.  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  Will need to do CCPD while admitted - pt aware.  Will use 1.5% dialysate tonight.  **Polycythemia:  Hb 16.3, mild leukocytosis and normal Plt.  Follow, may need outpt heme input  **Hypokalemia: common in PD + N/V + poor po intake.  Supplement.    **volume: on exam looks euvolemic currently, as above suspect she had hypovolemia in setting of N/V/D which improved with IVF. 1.5% dialysate.    **secondary hyperPTH:  cont binders, ca ok.  Cont VDRA.   Will follow.   Baron Border 02/29/2024, 12:13 PM

## 2024-02-29 NOTE — ED Notes (Signed)
 MD at bedside.

## 2024-02-29 NOTE — Assessment & Plan Note (Addendum)
 Considering hypovolemia secondary to over dialysis, dehydration, infection, fluid shift including peritoneum, autonomic dysfunction, midodrine  non adherence. S/o 750+ mL IVF and fluid status exam demonstrates hypovolema/euvolemia. MAPs appropriate.  - Restart home midodrine  10 mg TID w/ meals - narrow Abx per pharmacy  - discontinue metronidazole  - continue vancomycin  dosed per pharm (started 5/4, ending .Aaron AasAaron Aas)  - continue cefipime dosed per Pharm (started 5/4, ending .Aaron AasAaron Aas) - Follow Bcx, will likely stop abx if negative for 48 hours - Delirium precautions - PT/OT eval and treat - Goal MAP >65, monitor closely

## 2024-02-29 NOTE — ED Notes (Signed)
 Phlebotomy and IV team at patient bedside.

## 2024-02-29 NOTE — Assessment & Plan Note (Addendum)
 5th digit of left hand.  Amputation with VVS this Thursday on 5/8 Consult VVS for recommendation if still in hospital 5/8

## 2024-02-29 NOTE — Assessment & Plan Note (Signed)
 5th digit of left hand.  Amputation with VVS this Thursday on 5/8, may need to consult inpatient if remains hospitalized through that date depending on clinical status.

## 2024-02-29 NOTE — ED Notes (Signed)
Dr Neal at bedside

## 2024-02-29 NOTE — ED Provider Notes (Signed)
 [facility] EMERGENCY DEPARTMENT AT [facility] Provider Note   CSN: [id number] Arrival date & time: 02/29/24  0135     History  Chief Complaint  Patient presents with   Hypotension    Melissa Howe is a 73 y.o. female.  73 year old female brought in by EMS from home.  Patient states that they have not been feeling well for the past several days, has been home sleeping, vomiting.  Patient lives with daughter, does home peritoneal dialysis (q8 hours, last dialysis 8am Sunday, thinks the fluid may have been a little cloudy), unable to complete dialysis tonight due to weakness, hypotension, elevated glucose.  Denies fevers or abdominal pain.  Does not make urine. History significant for hypertension, hyperlipidemia, diabetes, [name], ESRD, CHF.         Home Medications Prior to Admission medications   Medication Sig Start Date End Date Taking? Authorizing Provider  acetaminophen  (TYLENOL  8 HOUR) 650 MG CR tablet Take 1 tablet (650 mg total) by mouth every 6 (six) hours as needed for pain or fever. 01/04/24   Melissa Face, MD  aspirin  81 MG chewable tablet Chew 1 tablet (81 mg total) by mouth daily. 07/13/21   Ghimire, Estil Heman, MD  atorvastatin  (LIPITOR ) 80 MG tablet Take 80 mg by mouth at bedtime.    [provider]  calcitRIOL  (ROCALTROL ) 0.25 MCG capsule Take 1 capsule (0.25 mcg total) by mouth daily. 02/13/24   Melissa Marrow, MD  cinacalcet  (SENSIPAR ) 30 MG tablet Take 30 mg by mouth daily. With snack    [provider]  clopidogrel  (PLAVIX ) 75 MG tablet Take 1 tablet (75 mg total) by mouth daily. 01/06/24 01/05/25  Rhyne, Samantha J, PA-C  ferric citrate  (AURYXIA ) 1 GM 210 MG(Fe) tablet Take 1 tablet (210 mg total) by mouth 2 (two) times daily with a meal. 02/12/24   Melissa Marrow, MD  gabapentin  (NEURONTIN ) 100 MG capsule Take 100 mg by mouth 2 (two) times daily. 11/02/23   [provider]  Hyprom-Naphaz-Polysorb-Zn Sulf (CLEAR EYES  COMPLETE OP) Place 1 drop into both eyes daily as needed (dry eyes).    [provider]  Insulin  Glargine Solostar (LANTUS ) 100 UNIT/ML Solostar Pen Inject 20 Units into the skin daily. 11/11/22   [provider]  Insulin  Pen Needle 32G X 4 MM MISC Use 4x a day 06/28/23   Thapa, [place], MD  linagliptin  (TRADJENTA ) 5 MG TABS tablet Take 1 tablet (5 mg total) by mouth daily. 06/26/23   Thapa, [place], MD  magnesium  gluconate (MAGONATE) 500 MG tablet Take 500 mg by mouth 2 (two) times a week.    [provider]  midodrine  (PROAMATINE ) 10 MG tablet Take 1 tablet (10 mg total) by mouth 3 (three) times daily with meals. 02/12/24 03/13/24  Melissa Marrow, MD  Nutritional Supplements (FEEDING SUPPLEMENT, NEPRO CARB STEADY,) LIQD Take 237 mLs by mouth 2 (two) times daily between meals. 02/12/24   Melissa Marrow, MD  ondansetron  (ZOFRAN -ODT) 4 MG disintegrating tablet Take 1 tablet (4 mg total) by mouth every 8 (eight) hours as needed for nausea or vomiting. 02/12/24   Howe, Michael, MD  oxyCODONE -acetaminophen  (PERCOCET/ROXICET) 5-325 MG tablet Take 1 tablet by mouth every 6 (six) hours as needed. Patient taking differently: Take 1 tablet by mouth every 6 (six) hours as needed for moderate pain (pain score 4-6). 01/15/24   Melissa Cashing, PA-C  triamcinolone  lotion (KENALOG ) 0.1 % Apply 1 Application topically 2 (two) times daily as needed (irritation).  [provider]      Allergies    Patient has no known allergies.    Review of Systems   Review of Systems Negative except as per HPI Physical Exam Updated Vital Signs BP 113/68   Pulse 82   Temp 98.1 F (36.7 C) (Oral)   Resp 17   Ht 5\' 4"  (1.626 m)   Wt 75.3 kg   SpO2 99%   BMI 28.49 kg/m  Physical Exam Vitals and nursing note reviewed.  Constitutional:      General: She is not in acute distress.    Appearance: She is well-developed. She is not diaphoretic.  HENT:     Head: Normocephalic and atraumatic.      Mouth/Throat:     Mouth: Mucous membranes are dry.  Cardiovascular:     Rate and Rhythm: Normal rate and regular rhythm.     Heart sounds: Normal heart sounds.  Pulmonary:     Effort: Pulmonary effort is normal.     Breath sounds: Normal breath sounds.  Abdominal:     Palpations: Abdomen is soft.     Tenderness: There is no abdominal tenderness.     Comments: Dialysis catheter left side abdomen  Musculoskeletal:     Cervical back: Neck supple.     Right lower leg: No edema.     Left lower leg: No edema.  Skin:    General: Skin is warm and dry.     Findings: No erythema or rash.  Neurological:     Mental Status: She is alert and oriented to person, place, and time.  Psychiatric:        Behavior: Behavior normal.     ED Results / Procedures / Treatments   Labs (all labs ordered are listed, but only abnormal results are displayed) Labs Reviewed  COMPREHENSIVE METABOLIC PANEL WITH GFR - Abnormal; Notable for the following components:      Result Value   Potassium 2.7 (*)    Chloride 92 (*)    Glucose, Bld 375 (*)    Creatinine, Ser 7.60 (*)    Calcium  8.6 (*)    Albumin  2.3 (*)    GFR, Estimated 5 (*)    Anion gap 19 (*)    All other components within normal limits  CBC WITH DIFFERENTIAL/PLATELET - Abnormal; Notable for the following components:   WBC 14.5 (*)    RBC 5.43 (*)    Hemoglobin 16.3 (*)    HCT 49.9 (*)    nRBC 0.3 (*)    Neutro Abs 10.8 (*)    Abs Immature Granulocytes 0.17 (*)    All other components within normal limits  I-STAT CG4 LACTIC ACID, ED - Abnormal; Notable for the following components:   Lactic Acid, Venous 3.8 (*)    All other components within normal limits  I-STAT VENOUS BLOOD GAS, ED - Abnormal; Notable for the following components:   pO2, Ven 27 (*)    Sodium 133 (*)    Potassium 3.2 (*)    Calcium , Ion 0.94 (*)    HCT 53.0 (*)    Hemoglobin 18.0 (*)    All other components within normal limits  CULTURE, BLOOD (ROUTINE X 2)   CULTURE, BLOOD (ROUTINE X 2)  PROTIME-INR  I-STAT CG4 LACTIC ACID, ED    EKG None  Radiology DG Chest Port 1 View Result Date: 02/29/2024 CLINICAL DATA:  Sepsis EXAM: PORTABLE CHEST 1 VIEW COMPARISON:  02/09/2024 FINDINGS: Cardiac shadow is within normal limits. Lungs  are hypoinflated but clear. No bony abnormality is noted. IMPRESSION: No active disease. Electronically Signed   By: Violeta Grey M.D.   On: 02/29/2024 03:24    Procedures .Critical Care  Performed by: Darlis Eisenmenger, PA-C Authorized by: Darlis Eisenmenger, PA-C   Critical care provider statement:    Critical care time (minutes):  75   Critical care was necessary to treat or prevent imminent or life-threatening deterioration of the following conditions:  Sepsis, metabolic crisis, renal failure, circulatory failure, dehydration and shock   Critical care was time spent personally by me on the following activities:  Development of treatment plan with patient or surrogate, discussions with consultants, evaluation of patient's response to treatment, examination of patient, ordering and review of laboratory studies, ordering and review of radiographic studies, ordering and performing treatments and interventions, pulse oximetry, re-evaluation of patient's condition and review of old charts   Care discussed with: admitting provider       Medications Ordered in ED Medications  metroNIDAZOLE  (FLAGYL ) IVPB 500 mg (500 mg Intravenous New Bag/Given 02/29/24 0316)  vancomycin  (VANCOREADY) IVPB 1500 mg/300 mL (1,500 mg Intravenous New Bag/Given 02/29/24 0331)  potassium chloride  10 mEq in 100 mL IVPB (10 mEq Intravenous New Bag/Given 02/29/24 0309)  sodium chloride  0.9 % bolus 500 mL (0 mLs Intravenous Stopped 02/29/24 0335)  ceFEPIme  (MAXIPIME ) 2 g in sodium chloride  0.9 % 100 mL IVPB (0 g Intravenous Stopped 02/29/24 0317)  potassium chloride  SA (KLOR-CON  M) CR tablet 40 mEq (40 mEq Oral Given 02/29/24 0310)    ED Course/ Medical Decision  Making/ A&P                                 Medical Decision Making Amount and/or Complexity of Data Reviewed Labs: ordered. Radiology: ordered.  Risk Prescription drug management. Decision regarding hospitalization.   This patient presents to the ED for concern of hypotension, hyperglycemia, this involves an extensive number of treatment options, and is a complaint that carries with it a high risk of complications and morbidity.  The differential diagnosis includes but not limited to sepsis, SBP, PNA, DKA   Co morbidities that complicate the patient evaluation  HTN, HLD, DM, anemia, [name], MI, ESRD, ischemic cardiomyopathy, PVD, CHF (EF 60-65% on last echo 04/15/23)   Additional history obtained:  Additional history obtained from EMS as above External records from outside source obtained and reviewed including consult with nephrology dated 02/09/24; dc summary dated 02/12/24- cultures negative, hypotension resolved    Lab Tests:  I Ordered, and personally interpreted labs.  The pertinent results include:  CBC with elevated WBC to 14.5 with increase in neutrophils; INR WNL; CMP with hypokalemia 2.7, glucose 375, not in DKA; lactic acid elevated at 3.8- will trend    Imaging Studies ordered:  I ordered imaging studies including CXR  I independently visualized and interpreted imaging which showed no acute process I agree with the radiologist interpretation   Cardiac Monitoring: / EKG:  The patient was maintained on a cardiac monitor.  I personally viewed and interpreted the cardiac monitored which showed an underlying rhythm of: sinus rhythm rate 84   Problem List / ED Course / Critical interventions / Medication management  73 yo female presents from home with concern for hypotension and hyperglycemia. BP on arrival 96/66, maps stable, BP improved to 113/68 with gentle IV fluids due to CHF history. Lactic acid 3.8 with WBC 14.5, consider  sepsis, SBP, started on broad spectrum  abx with nephrology to see in the AM. K 2.7, provided with PO and IV K. Discussed with Family Medicine service who will consult for admission.  I ordered medication including kdur, kcl  for hypokalemia; broad spectrum abx for sepsis undetermined source   Reevaluation of the patient after these medicines showed that the patient improved I have reviewed the patients home medicines and have made adjustments as needed   Consultations Obtained:  I requested consultation with the nephrologist, Dr. Zana Hesselbach,  and discussed lab and imaging findings as well as pertinent plan - they recommend: admit to hospitalist, Nephrology will see in the morning  Case discussed with Family Medicine teaching service who will consult for admission    Social Determinants of Health:  Lives with daughter   Test / Admission - Considered:  admit         Final Clinical Impression(s) / ED Diagnoses Final diagnoses:  Sepsis without acute organ dysfunction, due to unspecified organism [facility])  Hypokalemia    Rx / DC Orders ED Discharge Orders     None         Darlis Eisenmenger, PA-C 02/29/24 0352    Lindle Rhea, MD 03/01/24 [phone]

## 2024-02-29 NOTE — ED Notes (Signed)
 This RN unable to obtain 0512 labs, phlebotomy to attempt.

## 2024-03-01 ENCOUNTER — Inpatient Hospital Stay (HOSPITAL_COMMUNITY)

## 2024-03-01 DIAGNOSIS — N186 End stage renal disease: Secondary | ICD-10-CM

## 2024-03-01 DIAGNOSIS — Z992 Dependence on renal dialysis: Secondary | ICD-10-CM

## 2024-03-01 DIAGNOSIS — E43 Unspecified severe protein-calorie malnutrition: Secondary | ICD-10-CM | POA: Insufficient documentation

## 2024-03-01 LAB — GLUCOSE, CAPILLARY
Glucose-Capillary: 200 mg/dL — ABNORMAL HIGH (ref 70–99)
Glucose-Capillary: 452 mg/dL — ABNORMAL HIGH (ref 70–99)
Glucose-Capillary: 254 mg/dL — ABNORMAL HIGH (ref 70–99)
Glucose-Capillary: 176 mg/dL — ABNORMAL HIGH (ref 70–99)

## 2024-03-01 LAB — RENAL FUNCTION PANEL
Albumin: 1.8 g/dL — ABNORMAL LOW (ref 3.5–5.0)
Anion gap: 17 — ABNORMAL HIGH (ref 5–15)
BUN: 28 mg/dL — ABNORMAL HIGH (ref 8–23)
CO2: 22 mmol/L (ref 22–32)
Calcium: 8.6 mg/dL — ABNORMAL LOW (ref 8.9–10.3)
Chloride: 94 mmol/L — ABNORMAL LOW (ref 98–111)
Creatinine, Ser: 7.89 mg/dL — ABNORMAL HIGH (ref 0.44–1.00)
GFR, Estimated: 5 mL/min — ABNORMAL LOW (ref >60)
Glucose, Bld: 176 mg/dL — ABNORMAL HIGH (ref 70–99)
Phosphorus: 3.1 mg/dL (ref 2.5–4.6)
Potassium: 3 mmol/L — ABNORMAL LOW (ref 3.5–5.1)
Sodium: 133 mmol/L — ABNORMAL LOW (ref 135–145)

## 2024-03-01 LAB — CBC
HCT: 45.8 % (ref 36.0–46.0)
Hemoglobin: 14.9 g/dL (ref 12.0–15.0)
MCH: 29.7 pg (ref 26.0–34.0)
MCHC: 32.5 g/dL (ref 30.0–36.0)
MCV: 91.4 fL (ref 80.0–100.0)
Platelets: 243 10*3/uL (ref 150–400)
RBC: 5.01 MIL/uL (ref 3.87–5.11)
RDW: 14.6 % (ref 11.5–15.5)
WBC: 13.7 10*3/uL — ABNORMAL HIGH (ref 4.0–10.5)
nRBC: 0.2 % (ref 0.0–0.2)

## 2024-03-01 MED ORDER — POTASSIUM CHLORIDE 20 MEQ PO PACK
60.0000 meq | PACK | Freq: Once | ORAL | Status: AC
  Administered 2024-03-01: 60 meq via ORAL
  Filled 2024-03-01: qty 3

## 2024-03-01 MED ORDER — RENA-VITE PO TABS
1.0000 | ORAL_TABLET | Freq: Every day | ORAL | Status: DC
  Administered 2024-03-01: 1 via ORAL
  Filled 2024-03-01: qty 1

## 2024-03-01 MED ORDER — ENSURE ENLIVE PO LIQD
237.0000 mL | Freq: Two times a day (BID) | ORAL | Status: DC
  Administered 2024-03-02: 237 mL via ORAL

## 2024-03-01 MED ORDER — POLYETHYLENE GLYCOL 3350 17 G PO PACK
17.0000 g | PACK | Freq: Every day | ORAL | Status: DC
  Administered 2024-03-02: 17 g via ORAL
  Filled 2024-03-01 (×2): qty 1

## 2024-03-01 NOTE — Assessment & Plan Note (Addendum)
 Nephrology following. Possible infection will consider impiric treatment vs culturing.  - Midodrine  as above - Renal diet with 1200 mL fluid restriction - Strict I&Os, daily weights - AM RFP - Peritoneal dialysis management per nephrology - Follow-up nephrology recommendations

## 2024-03-01 NOTE — Progress Notes (Signed)
 Patients BP 82/50 MAP 61. Asymptomatic. MD notified.

## 2024-03-01 NOTE — Assessment & Plan Note (Signed)
 Has not pooped in several days and is having some GI upset.  Routinely does not poop for couple days at a time outpatient but GI distress is new.  Plan to get KUB and start laxative. Follow-up KUB

## 2024-03-01 NOTE — Assessment & Plan Note (Signed)
 Last A1c 7.7 on 02/10/24.  Glucose improved from 300s to around 200. Ddx hyperglycemia includes infection, medication nonadherence, under-optimized insulin , poor diet.  Home regimen: Lantus  20 units daily, linagliptin  5 mg daily - CBGs ACHS - vsSSI - Continue home glargine 20 units daily - Monitor glucose levels

## 2024-03-01 NOTE — Progress Notes (Signed)
 [facility] Progress Note   Subjective:   No further emesis or diarrhea.  Poor po intake - no appetite, taking fluids.  PD fine overnight on cycler.  No f/c/dyspnea/edema.  Objective Vitals:   03/01/24 0337 03/01/24 0422 03/01/24 0827 03/01/24 0920  BP: (!) 90/56  (!) 81/55 (!) 84/59  Pulse: 61  83 84  Resp: 20 15 13 17   Temp: 97.7 F (36.5 C)  (!) 97.4 F (36.3 C)   TempSrc: Oral  Oral   SpO2: 99%  98% 97%  Weight:  73.5 kg    Height:       Physical Exam Gen: tired but nontoxic appearing  Eyes: anicteric., EOMI ENT: MMM Neck: supple, no JVD CV:  SB, no rub Abd:  soft, nontender, LLQ PD catheter dressing intact, no drainage or tenderness Lungs: clear on RA Extr: no edema, L arm with clotted forearm AVG and ligated BC AVF.  Necrotic dry tip of L 5th finger (ischemic), no erythema or drainage Neuro: nonfocal Skin: no rashes  Additional Objective Labs: Basic Metabolic Panel: Recent Labs  Lab 02/29/24 0205 02/29/24 0222 02/29/24 0516 03/01/24 0351  NA 135 133* 132* 133*  K 2.7* 3.2* 2.7* 3.0*  CL 92*  --  92* 94*  CO2 24  --  22 22  GLUCOSE 375*  --  335* 176*  BUN 21  --  21 28*  CREATININE 7.60*  --  7.37* 7.89*  CALCIUM  8.6*  --  8.1* 8.6*  PHOS  --   --  3.2 3.1   Liver Function Tests: Recent Labs  Lab 02/29/24 0205 02/29/24 0516 03/01/24 0351  AST 18  --   --   ALT 16  --   --   ALKPHOS 115  --   --   BILITOT 0.7  --   --   PROT 6.8  --   --   ALBUMIN  2.3* 2.0* 1.8*   No results for input(s): "LIPASE", "AMYLASE" in the last 168 hours. CBC: Recent Labs  Lab 02/29/24 0205 02/29/24 0222 03/01/24 0351  WBC 14.5*  --  13.7*  NEUTROABS 10.8*  --   --   HGB 16.3* 18.0* 14.9  HCT 49.9* 53.0* 45.8  MCV 91.9  --  91.4  PLT 242  --  243   Blood Culture    Component Value Date/Time   SDES BLOOD BLOOD RIGHT HAND 02/29/2024 2223   SPECREQUEST  02/29/2024 2223    BOTTLES DRAWN AEROBIC AND ANAEROBIC Blood Culture results may not be  optimal due to an inadequate volume of blood received in culture bottles   CULT  02/29/2024 2223    NO GROWTH < 12 HOURS Performed at [facility], [address] [place], [place] [address]    REPTSTATUS PENDING 02/29/2024 2223    Cardiac Enzymes: No results for input(s): "CKTOTAL", "CKMB", "CKMBINDEX", "TROPONINI" in the last 168 hours. CBG: Recent Labs  Lab 02/29/24 0736 02/29/24 1145 02/29/24 1820 02/29/24 2204 03/01/24 0631  GLUCAP 295* 199* 205* 166* 200*   Iron  Studies: No results for input(s): "IRON ", "TIBC", "TRANSFERRIN", "FERRITIN" in the last 72 hours. @lablastinr3 @ Studies/Results: DG Chest Port 1 View Result Date: 02/29/2024 CLINICAL DATA:  Sepsis EXAM: PORTABLE CHEST 1 VIEW COMPARISON:  02/09/2024 FINDINGS: Cardiac shadow is within normal limits. Lungs are hypoinflated but clear. No bony abnormality is noted. IMPRESSION: No active disease. Electronically Signed   By: Violeta Grey M.D.   On: 02/29/2024 03:24   Medications:  ceFEPime  (  MAXIPIME ) IV 1 g (03/01/24 0408)   dialysis solution 1.5% low-MG/low-CA      aspirin   81 mg Oral Daily   atorvastatin   80 mg Oral QHS   calcitRIOL   0.25 mcg Oral Daily   clopidogrel   75 mg Oral Daily   feeding supplement (NEPRO CARB STEADY)  237 mL Oral BID BM   gabapentin   100 mg Oral BID   gentamicin  cream  1 Application Topical Daily   heparin  injection (subcutaneous)  5,000 Units Subcutaneous Q8H   insulin  aspart  0-6 Units Subcutaneous TID WC   insulin  glargine-yfgn  20 Units Subcutaneous Daily   midodrine   10 mg Oral TID WC    Assessment/PlanJean Evans Wirtanen is an 73 y.o. 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    **hypotension, shock: Lactate 3.  In setting of poor po intake, N/V and improved with IVF.  Midodrine  10 TID.  Afebrile but with N/V/D - r/o GI infection.  Peritoneal fluid clean.    **ESRD on CAPD:  cell  count normal per above.  Typically does CAPD but will do CCPD while admitted - pt aware.  Cont w 1.5% dialysate tonight.   **Polycythemia:  Hb 16.3 on presentation now 14.9 with no e/o bleeding - suspect was hemoconcentrated.   **Hypokalemia: common in PD + N/V + poor po intake.  Supplement more, improving but still 3.   **volume: on exam looks euvolemic currently, as above suspect she had hypovolemia in setting of N/V/D which improved with IVF. 1.5% dialysate.     **secondary hyperPTH:  cont binders, ca ok.  Cont VDRA.    Will follow.  Adrian Alba MD 03/01/2024, 9:43 AM  [facility] Pager: [phone]

## 2024-03-01 NOTE — Assessment & Plan Note (Addendum)
 Suspect malnutrition given exam and poor PO intake as well as hypokalemia. - Restart home Nepro Carb Steady feeding supplement BID

## 2024-03-01 NOTE — Progress Notes (Signed)
 Initial Nutrition Assessment  DOCUMENTATION CODES:  Severe malnutrition in context of acute illness/injury  INTERVENTION:  Liberalize diet to carb modified to provide wider variety of menu options to promote adequate PO intake CCPD providing 180-234 kcal daily  Ensure Enlive po BID, each supplement provides 350 kcal and 20 grams of protein. Renal MVI with minerals daily Consider consult for diabetes coordinator to assess insulin  regimen and necessity of meal time coverage; reached out to MD  NUTRITION DIAGNOSIS:  Severe Malnutrition related to acute illness (emesis) as evidenced by energy intake < or equal to 50% for > or equal to 5 days, moderate muscle depletion.  GOAL:  Patient will meet greater than or equal to 90% of their needs  MONITOR:  PO intake, Supplement acceptance, Labs, Weight trends, I & O's  REASON FOR ASSESSMENT:  Consult Assessment of nutrition requirement/status  ASSESSMENT:  Pt admitted with c/o vomiting, found to be hypotensive on admission. PMH significant for ESRD on PD, CHF, HLD, diabetic neuropathy, NSTEMI, [name].  Spoke with pt and her daughter at bedside. Pt typically eats 2 meals and a snack in between at her baseline. Breakfast usually includes eggs and bacon or oatmeal. Snacks include boiled eggs, popcorn or chips. Dinner usually includes a protein like chicken and some sides. Pt also reports loving corn on the cob. She does not follow any specific diet at home other than trying to ensure she gets in adequate protein intake.  Within the last 3 weeks d/t nausea and emesis, she has been eating at least 50% less than normal. Her daughter reports that instead of consuming 2 adult sized plates daily, she has been eating about 50% of a kids size plate/portion at meal times. She had just purchased her Enterex protein supplements but she had not gotten a chance to consume them PTA.   Pt reports that she has been experiencing indigestion which she feels was worsened  by consuming 100% of a Nepro shakes. Will trial an alternative nutrition supplement as Nepro contains high fat content which can often be difficult to tolerate.   No documented meal completions on file to review. Pt states that her appetite has improved slightly. Today she ate soup for lunch.   Changed to CAPD about 6 months ago.  Nephrology transitioning to CCPD during admission.  CCPD providing 180-234 kcal daily   Admit weight: 75.3 kg Current weight: 73.5 kg EDW: 74 kg (per daughter)  Based on report of significant decrease in PO intake within the last 3 weeks and observed moderate muscle deficits on nutrition focused physical exam, pt meets criteria for acute malnutrition however suspect there to be a chronic degree of underlying malnutrition in the setting of multiple co-morbidities.   Medications: calcitriol  daily, SSI 0-6 units TID, semglee  20 units daily, IV abx  Labs: Sodium 133 Potassium 3.0 BUN 28 Cr 7.89 Anion gap 17 GFR 5 CBG's 166-205 x24 hours HgbA1c 7.7% (02/10/24)  NUTRITION - FOCUSED PHYSICAL EXAM: Flowsheet Row Most Recent Value  Orbital Region No depletion  Upper Arm Region No depletion  Thoracic and Lumbar Region No depletion  Buccal Region No depletion  Temple Region No depletion  Clavicle Bone Region Mild depletion  Clavicle and Acromion Bone Region Mild depletion  Scapular Bone Region Moderate depletion  Dorsal Hand Moderate depletion  Patellar Region Moderate depletion  Anterior Thigh Region Moderate depletion  Posterior Calf Region Moderate depletion  Edema (RD Assessment) None  Hair Reviewed  Eyes Reviewed  Mouth Other (Comment)  [wears dentures]  Skin Reviewed  Nails Reviewed   Diet Order:   Diet Order             Diet renal/carb modified with fluid restriction Diet-HS Snack? Nothing; Fluid restriction: 1200 mL Fluid; Room service appropriate? Yes; Fluid consistency: Thin  Diet effective now                   EDUCATION NEEDS:    Education needs have been addressed  Skin:  Skin Assessment: Reviewed RN Assessment  Last BM:  PTA  Height:   Ht Readings from Last 1 Encounters:  02/29/24 5\' 4"  (1.626 m)    Weight:   Wt Readings from Last 1 Encounters:  03/01/24 73.5 kg   BMI:  Body mass index is 27.81 kg/m.  Estimated Nutritional Needs:   Kcal:  1700-1900  Protein:  85-100g  Fluid:  1L + UOP  Rocklin Chute, RDN, LDN Clinical Nutrition See AMiON for contact information.

## 2024-03-01 NOTE — Assessment & Plan Note (Signed)
 Noted on admission, persisted on recheck with hypomagnesemia.  Is on 20 mEq daily at home, but did not restart inpatient in setting of dialysis, will exercise caution with electrolyte management.  - 60 mEq Kcl and 2 g Mag this AM and follow up Nephrology plan for electrolytes

## 2024-03-01 NOTE — Assessment & Plan Note (Addendum)
 Ruled out acute causes of hypovolemia including infection.  Likely due to chronic dialysis and poor prognosis.  Euvolemic today.  Stable for discharge tomorrow once blood cultures negative for 48 hours. - Restart home midodrine  10 mg TID w/ meals - Discontinue antibiotics - Follow Bcx - Delirium precautions - PT/OT eval and treat - Goal MAP >65, monitor closely

## 2024-03-01 NOTE — Assessment & Plan Note (Addendum)
 Hyperlipidemia: Continue atorvastatin  80 mg nightly Diabetic neuropathy: Continue gabapentin  100 mg BID Hx NSTEMI, [name]: Continue DAPT, s/p coronary artery stent, chronic stable hgb Dry eyes: artificial tears daily as needed

## 2024-03-01 NOTE — Discharge Summary (Incomplete)
 Family Medicine Teaching [facility] Discharge Summary  Patient name: Melissa Howe Medical record number: [id number] Date of birth: August 24, 1951 Age: 73 y.o. Gender: female Date of Admission: 02/29/2024  Date of Discharge: 03/02/2024 Admitting Physician: Homer Lust, MD  Primary Care Provider: Shannan Dart., FNP Consultants: Nephrology  Indication for Hospitalization: Hypotension  Discharge Diagnoses/Problem List:  Principal Problem for Admission:  Other Problems addressed during stay:  Principal Problem:   Hypotension Active Problems:   Constipation   Vomiting   T2DM (type 2 diabetes mellitus) (HCC)   ESRD on peritoneal dialysis (HCC)   Chronic health problem   Dry gangrene (HCC)   Hypokalemia   Malnutrition (HCC)   Protein-calorie malnutrition, severe    Brief Hospital Course:  Melissa Howe is a 73 y.o.female with a history of ESRD on dialysis, T2DM on insulin , hypotension on midodrine   who was admitted to the [facility] Teaching Service at [facility] for hypotension. Her hospital course is detailed below:  Hypotension MAP > 65 on admission / throughout hospitalization. Ddx over-dialysizing, dehydration, infection, fluid shift including peritoneum, autonomic dysfunction, midodrine  non adherence. Reported vomiting for week prior to admission. Acute causes including infection ruled out including asymptomatic, stable vitals (except low Bps), negative blood cultures, . Continued blood pressures when euvolemic. Appears to be secondary to chronic dialysis.  Patient provided TED hoes. At discharge, blood pressure was stable on home midodrine  10 mg TID. She was discharged home in stable condition with outpatient follow up with nephrology.   Hypokalemia Required significant supplementation in hospital. Ranged in upper 2's and lower 3's. Per nephro likely secondary to dialysis, n/v, poor oral intake. Discharging on 20 mEq twice daily until follow up.   Goals of Care Pt is  full code. Her prognosis is poor. Discussed with pt doing an advanced directive with her PCP. Poor full code candidate and would be more appropriate for DNR given low likelihood of recovery.   Other chronic conditions were medically managed with home medications and formulary alternatives as necessary (diabetes, diabetic neuropathy, hyperlipidemia, [name], dry eyes, dry gangrene)  PCP Follow-up Recommendations: Discuss goals of care with patient given poor prognosis with hypotension from chronic dialysis. Particularly Full code status.  BMET to monitor potassium on 20 mEq bid   Disposition: Home  Discharge Condition: Stable  Discharge Exam:  Vitals:   03/02/24 0733 03/02/24 1109  BP: (!) 99/55 (!) 83/47  Pulse: (!) 50 73  Resp: 14 18  Temp: 97.7 F (36.5 C) 98.7 F (37.1 C)  SpO2: 100% 94%   General Fluid status: Cardiac: Normal rate and rhythm.  Normal heart sounds Pulmonary: Normal effort.  Clear to auscultation bilaterally. Abdomen: Soft and nontender. Catheter in place and no signs of infection.  Bowel sounds present. Extremities: No lower extremity edema.   Significant Labs and Imaging:  Recent Labs  Lab 03/01/24 0351  WBC 13.7*  HGB 14.9  HCT 45.8  PLT 243   Recent Labs  Lab 03/01/24 0351 03/02/24 0303 03/02/24 0912  NA 133* 131* 131*  K 3.0* 2.4* 2.7*  CL 94* 93* 92*  CO2 22 21* 24  GLUCOSE 176* 194* 138*  BUN 28* 26* 26*  CREATININE 7.89* 6.94* 7.23*  CALCIUM  8.6* 8.6* 8.8*  MG  --   --  1.7  PHOS 3.1 3.0 2.8  ALBUMIN  1.8* 1.9* 1.7*    No significant imaging studies during admission.   Results/Tests Pending at Time of Discharge: none  Discharge Medications:  Allergies as of  03/02/2024   No Known Allergies      Medication List     TAKE these medications    acetaminophen  650 MG CR tablet Commonly known as: Tylenol  8 Hour Take 1 tablet (650 mg total) by mouth every 6 (six) hours as needed for pain or fever.   aspirin  EC 81 MG tablet Take  81 mg by mouth daily. Swallow whole.   atorvastatin  80 MG tablet Commonly known as: LIPITOR  Take 80 mg by mouth at bedtime.   calcitRIOL  0.5 MCG capsule Commonly known as: ROCALTROL  Take 0.5 mcg by mouth daily.   cinacalcet  30 MG tablet Commonly known as: SENSIPAR  Take 30 mg by mouth daily. With snack   clopidogrel  75 MG tablet Commonly known as: Plavix  Take 1 tablet (75 mg total) by mouth daily.   feeding supplement (NEPRO CARB STEADY) Liqd Take 237 mLs by mouth 2 (two) times daily between meals.   gabapentin  100 MG capsule Commonly known as: NEURONTIN  Take 100 mg by mouth 2 (two) times daily.   Insulin  Glargine Solostar 100 UNIT/ML Solostar Pen Commonly known as: LANTUS  Inject 20 Units into the skin daily.   linagliptin  5 MG Tabs tablet Commonly known as: TRADJENTA  Take 1 tablet (5 mg total) by mouth daily.   midodrine  10 MG tablet Commonly known as: PROAMATINE  Take 1 tablet (10 mg total) by mouth 3 (three) times daily with meals.   ondansetron  4 MG disintegrating tablet Commonly known as: ZOFRAN -ODT Take 1 tablet (4 mg total) by mouth every 8 (eight) hours as needed for nausea or vomiting.   potassium chloride  SA 20 MEQ tablet Commonly known as: KLOR-CON  M Take 1 tablet (20 mEq total) by mouth 2 (two) times daily.   sevelamer  carbonate 2.4 g Pack Commonly known as: RENVELA  Take 2.4 g by mouth 3 (three) times daily with meals.   triamcinolone  lotion 0.1 % Commonly known as: KENALOG  Apply 1 Application topically 2 (two) times daily as needed (irritation).        Discharge Instructions: Please refer to Patient Instructions section of EMR for full details.  Patient was counseled important signs and symptoms that should prompt return to medical care, changes in medications, dietary instructions, activity restrictions, and follow up appointments.   Follow-Up Appointments: Follow up with Melissa Jones FNP and your nephrologist.   Verdell Given, MD 03/02/2024,  11:28 AM PGY-1, [facility] Family Medicine

## 2024-03-01 NOTE — Progress Notes (Signed)
 Daily Progress Note Intern Pager: [phone]  Patient name: Melissa Howe Medical record number: [id number] Date of birth: 06-15-1951 Age: 73 y.o. Gender: female  Primary Care Provider: Shannan Dart., FNP Consultants: Nephrology Code Status: Full code  Pt Overview and Major Events to Date:  Melissa Howe is a 73 y.o. female presenting with vomiting and brief resolved period of altered consciousness as well as a hyperglycemia and admitted for persistent hypotension.   Assessment and Plan:  Unclear etiology for patient's hypotension will defer largely to nephrology for their assessment of fluid status and for any concern of peritonitis, which on our exam appears to be low suspicion.  Will continue antibiotics until blood cultures negative for 48 hours. Assessment & Plan Hypotension Ruled out acute causes of hypovolemia including infection.  Likely due to chronic dialysis and poor prognosis.  Euvolemic today.  Stable for discharge tomorrow once blood cultures negative for 48 hours. - Restart home midodrine  10 mg TID w/ meals - Discontinue antibiotics - Follow Bcx - Delirium precautions - PT/OT eval and treat - Goal MAP >65, monitor closely ESRD on peritoneal dialysis [facility]) Nephrology following. Possible infection will consider impiric treatment vs culturing.  - Midodrine  as above - Renal diet with 1200 mL fluid restriction - Strict I&Os, daily weights - AM RFP - Peritoneal dialysis management per nephrology - Follow-up nephrology recommendations High serum lactate (Resolved: 03/01/2024) Lactate 3.8->3.1->2.3->1.7  Vomiting Unclear etiology, likely contributing to hypovolemia.  Possible infectious source, will follow closely.  No nausea today. 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  Amputation with VVS this Thursday on 5/8 Consult VVS for recommendation if still in hospital 5/8 T2DM (type 2  diabetes mellitus) (HCC) Last A1c 7.7 on 02/10/24.  Glucose improved from 300s to around 200. Ddx hyperglycemia includes infection, medication nonadherence, under-optimized insulin , poor diet.  Home regimen: Lantus  20 units daily, linagliptin  5 mg daily - CBGs ACHS - vsSSI - Continue home glargine 20 units daily - Monitor glucose levels Malnutrition (HCC) Suspect malnutrition given exam and poor PO intake as well as hypokalemia. - Restart home Nepro Carb Steady feeding supplement BID Chronic health problem Hyperlipidemia: Continue atorvastatin  80 mg nightly Diabetic neuropathy: Continue gabapentin  100 mg BID Hx NSTEMI, [name]: Continue DAPT, s/p coronary artery stent, chronic stable hgb Dry eyes: artificial tears daily as needed Hypokalemia Noted on admission, persisted on recheck with hypomagnesemia.  Is on 20 mEq daily at home, but did not restart inpatient in setting of dialysis, will exercise caution with electrolyte management.  - 60 mEq Kcl and 2 g Mag this AM and follow up Nephrology plan for electrolytes Constipation Has not pooped in several days and is having some GI upset.  Routinely does not poop for couple days at a time outpatient but GI distress is new.  Plan to get KUB and start laxative. Follow-up KUB   FEN/GI: Renal carb modified diet with 1200 mL fluid restriction  PPx: Heparin  TID  Dispo:Home pending clinical improvement . Barriers include hypotension and rule out infection.   Subjective:  Patient reports that she is not having any nausea or vomiting today.  Let her know that we have a GI pathogen panel if she is able to have a bowel movement and she reports that she has not had a bowel movement since being in the hospital.  Reports that is normal for her not had vomiting for couple  days but that she has been having GI upset.  Reports no concerns.  Objective: Temp:  [97.4 F (36.3 C)-98.6 F (37 C)] 97.4 F (36.3 C) (05/06 0827) Pulse Rate:  [61-87] 67 (05/06  1125) Resp:  [13-22] 17 (05/06 1125) BP: (81-111)/(50-73) 82/50 (05/06 1125) SpO2:  [95 %-99 %] 96 % (05/06 1125) Weight:  [162 lb 0.6 oz (73.5 kg)] 162 lb 0.6 oz (73.5 kg) (05/06 0422) Physical Exam: General: Calm and cooperative.  Not in acute distress.  Laying in bed.  On dialysis during interview. Fluid status: Wet oral mucosa.  CRT less than 3 seconds.  Normal skin turgor. Cardiac: Normal rate and rhythm.  Normal heart sounds. Pulmonary: Normal effort.  Lungs clear to auscultation bilaterally. Abdominal: Soft and nontender.  Appropriately distended from peritoneal dialysis fluid. Extremities: No bilateral lower extremity edema Neuro: Attention and memory intact.  No focal deficits.  Laboratory: Most recent CBC Lab Results  Component Value Date   WBC 13.7 (H) 03/01/2024   HGB 14.9 03/01/2024   HCT 45.8 03/01/2024   MCV 91.4 03/01/2024   PLT 243 03/01/2024   Most recent BMP    Latest Ref Rng & Units 03/01/2024    3:51 AM  BMP  Glucose 70 - 99 mg/dL 409   BUN 8 - 23 mg/dL 28   Creatinine 8.11 - 1.00 mg/dL 9.14   Sodium 782 - 956 mmol/L 133   Potassium 3.5 - 5.1 mmol/L 3.0   Chloride 98 - 111 mmol/L 94   CO2 22 - 32 mmol/L 22   Calcium  8.9 - 10.3 mg/dL 8.6     Lactate 2.1->3.0->8.6 Peritoneal fluid culture no white blood cells, no organisms Blood culture: negative 12 hours.    Verdell Given, MD 03/01/2024, 1:08 PM  PGY-1, [facility] Family Medicine FPTS Intern pager: [phone], text pages welcome Secure chat group [facility] Teaching Service

## 2024-03-01 NOTE — Assessment & Plan Note (Signed)
 Lactate 3.8->3.1->2.3->1.7

## 2024-03-01 NOTE — Evaluation (Signed)
 Occupational Therapy Evaluation Patient Details Name: Melissa Howe MRN: [id number] DOB: 05/15/1951 Today's Date: 03/01/2024   History of Present Illness   Pt is a 73 y/o F admitted on 02/29/24 after presenting with c/o vomiting & brief, resolved period of altered consciousness, & hyperglycemia. Pt is being treated for hypotension 2/2 overdialysis vs dehydration. PMH: ESRD, peripheral neuropathy 2/2 DM2, HFpEF, [name], depression, HLD, NSTEMI     Clinical Impressions Patient admitted for the diagnosis above.  PTA she lives at home with her daughter.  Patient is on contact precautions, and does have bowel incontinence.  Currently needing Min A for basic mobility and ADL completion.  OT will follow in the acute setting to address deficits, but given family support, no post acute OT is anticipated.       If plan is discharge home, recommend the following:   Assist for transportation     Functional Status Assessment   Patient has had a recent decline in their functional status and demonstrates the ability to make significant improvements in function in a reasonable and predictable amount of time.     Equipment Recommendations   None recommended by OT     Recommendations for Other Services         Precautions/Restrictions   Precautions Precautions: Fall Precaution/Restrictions Comments: Contact Precautions Restrictions Weight Bearing Restrictions Per Provider Order: No     Mobility Bed Mobility Overal bed mobility: Needs Assistance Bed Mobility: Supine to Sit     Supine to sit: Supervision          Transfers Overall transfer level: Needs assistance   Transfers: Sit to/from Stand, Bed to chair/wheelchair/BSC Sit to Stand: Min assist     Step pivot transfers: Contact guard assist            Balance Overall balance assessment: Needs assistance Sitting-balance support: Feet supported, No upper extremity supported Sitting balance-Leahy Scale: Good      Standing balance support: Reliant on assistive device for balance Standing balance-Leahy Scale: Fair                             ADL either performed or assessed with clinical judgement   ADL       Grooming: Wash/dry hands;Supervision/safety;Standing       Lower Body Bathing: Supervison/ safety;Sitting/lateral leans;Sit to/from stand       Lower Body Dressing: Minimal assistance;Sitting/lateral leans;Sit to/from stand   Toilet Transfer: Minimal assistance;Rolling walker (2 wheels);Regular Toilet                   Vision Patient Visual Report: No change from baseline       Perception Perception: Not tested       Praxis Praxis: Not tested       Pertinent Vitals/Pain Pain Assessment Pain Assessment: Faces Faces Pain Scale: Hurts little more Pain Descriptors / Indicators: Headache Pain Intervention(s): Monitored during session     Extremity/Trunk Assessment Upper Extremity Assessment Upper Extremity Assessment: Overall WFL for tasks assessed   Lower Extremity Assessment Lower Extremity Assessment: Defer to PT evaluation   Cervical / Trunk Assessment Cervical / Trunk Assessment: Normal   Communication Communication Communication: No apparent difficulties   Cognition Arousal: Alert Behavior During Therapy: WFL for tasks assessed/performed Cognition: No apparent impairments  Following commands: Intact       Cueing  General Comments   Cueing Techniques: Verbal cues      Exercises     Shoulder Instructions      Home Living Family/patient expects to be discharged to:: Private residence Living Arrangements: Children Available Help at Discharge: Family;Available PRN/intermittently Type of Home: House Home Access: Stairs to enter Entrance Stairs-Number of Steps: 1 Entrance Stairs-Rails: None Home Layout: Two level;Bed/bath upstairs;Able to live on main level with bedroom/bathroom;Full  bath on main level Alternate Level Stairs-Number of Steps: Flight Alternate Level Stairs-Rails: Left Bathroom Shower/Tub: Chief Strategy Officer: Standard     Home Equipment: Grab bars - tub/shower;Rolling Walker (2 wheels);Hand held shower head;Tub bench;Rollator (4 wheels)   Additional Comments: has rollator downstairs      Prior Functioning/Environment Prior Level of Function : Needs assist;History of Falls (last six months)       Physical Assist : Mobility (physical);ADLs (physical) Mobility (physical): Stairs;Gait ADLs (physical): IADLs Mobility Comments: Tries to come downstairs every other day. Daughter provides supervision/assist when pt descend stairs, pt crawls up the stairs with daughter providing supervision. Ambulatory on 2nd level of home with RW, typically gets OOB on her own but has required more assistance "lately" (~past week), PRN assistance to get in bed 2/2 difficulty lifting legs. Walks to bathroom & back. Denies falls in the past 6 months. ADLs Comments: Reports she bathes & dresses herself, daughter assists pt with getting in/out of tub.    OT Problem List: Decreased strength;Decreased activity tolerance   OT Treatment/Interventions: Self-care/ADL training;Therapeutic activities;DME and/or AE instruction;Balance training;Patient/family education      OT Goals(Current goals can be found in the care plan section)   Acute Rehab OT Goals Patient Stated Goal: Return home OT Goal Formulation: With patient Time For Goal Achievement: 03/15/24 Potential to Achieve Goals: Good ADL Goals Pt Will Perform Grooming: with modified independence;standing Pt Will Perform Lower Body Dressing: with modified independence;sit to/from stand Pt Will Transfer to Toilet: with modified independence;regular height toilet;ambulating   OT Frequency:  Min 2X/week    Co-evaluation              AM-PAC OT "6 Clicks" Daily Activity     Outcome Measure Help from  another person eating meals?: None Help from another person taking care of personal grooming?: A Little Help from another person toileting, which includes using toliet, bedpan, or urinal?: A Little Help from another person bathing (including washing, rinsing, drying)?: A Little Help from another person to put on and taking off regular upper body clothing?: None Help from another person to put on and taking off regular lower body clothing?: A Little 6 Click Score: 20   End of Session Equipment Utilized During Treatment: Rolling walker (2 wheels) Nurse Communication: Mobility status  Activity Tolerance: Patient tolerated treatment well Patient left: in chair;with call bell/phone within reach;with chair alarm set  OT Visit Diagnosis: Unsteadiness on feet (R26.81)                Time: 1200-1240 OT Time Calculation (min): 40 min Charges:  OT General Charges $OT Visit: 1 Visit OT Evaluation $OT Eval Moderate Complexity: 1 Mod OT Treatments $Self Care/Home Management : 23-37 mins  03/01/2024  RP, OTR/L  Acute Rehabilitation Services  Office:  [phone]   Benjamen Brand 03/01/2024, 1:09 PM

## 2024-03-01 NOTE — Assessment & Plan Note (Addendum)
 Unclear etiology, likely contributing to hypovolemia.  Possible infectious source, will follow closely.  No nausea today. - Enteric precautions, GI pathogen panel - Ondansetron  4 mg Q6h PRN

## 2024-03-01 NOTE — Assessment & Plan Note (Addendum)
 5th digit of left hand.  Amputation with VVS this Thursday on 5/8 Consult VVS for recommendation if still in hospital 5/8

## 2024-03-02 ENCOUNTER — Other Ambulatory Visit: Payer: Self-pay

## 2024-03-02 ENCOUNTER — Encounter (HOSPITAL_COMMUNITY): Payer: Self-pay | Admitting: Orthopedic Surgery

## 2024-03-02 ENCOUNTER — Other Ambulatory Visit (HOSPITAL_COMMUNITY): Payer: Self-pay

## 2024-03-02 LAB — RENAL FUNCTION PANEL
Albumin: 1.9 g/dL — ABNORMAL LOW (ref 3.5–5.0)
Albumin: 1.7 g/dL — ABNORMAL LOW (ref 3.5–5.0)
Anion gap: 17 — ABNORMAL HIGH (ref 5–15)
Anion gap: 15 (ref 5–15)
BUN: 26 mg/dL — ABNORMAL HIGH (ref 8–23)
BUN: 26 mg/dL — ABNORMAL HIGH (ref 8–23)
CO2: 21 mmol/L — ABNORMAL LOW (ref 22–32)
CO2: 24 mmol/L (ref 22–32)
Calcium: 8.6 mg/dL — ABNORMAL LOW (ref 8.9–10.3)
Calcium: 8.8 mg/dL — ABNORMAL LOW (ref 8.9–10.3)
Chloride: 93 mmol/L — ABNORMAL LOW (ref 98–111)
Chloride: 92 mmol/L — ABNORMAL LOW (ref 98–111)
Creatinine, Ser: 6.94 mg/dL — ABNORMAL HIGH (ref 0.44–1.00)
Creatinine, Ser: 7.23 mg/dL — ABNORMAL HIGH (ref 0.44–1.00)
GFR, Estimated: 6 mL/min — ABNORMAL LOW (ref >60)
GFR, Estimated: 6 mL/min — ABNORMAL LOW (ref >60)
Glucose, Bld: 194 mg/dL — ABNORMAL HIGH (ref 70–99)
Glucose, Bld: 138 mg/dL — ABNORMAL HIGH (ref 70–99)
Phosphorus: 3 mg/dL (ref 2.5–4.6)
Phosphorus: 2.8 mg/dL (ref 2.5–4.6)
Potassium: 2.4 mmol/L — CL (ref 3.5–5.1)
Potassium: 2.7 mmol/L — CL (ref 3.5–5.1)
Sodium: 131 mmol/L — ABNORMAL LOW (ref 135–145)
Sodium: 131 mmol/L — ABNORMAL LOW (ref 135–145)

## 2024-03-02 LAB — GLUCOSE, CAPILLARY
Glucose-Capillary: 172 mg/dL — ABNORMAL HIGH (ref 70–99)
Glucose-Capillary: 211 mg/dL — ABNORMAL HIGH (ref 70–99)

## 2024-03-02 LAB — MAGNESIUM: Magnesium: 1.7 mg/dL (ref 1.7–2.4)

## 2024-03-02 MED ORDER — POTASSIUM CHLORIDE CRYS ER 20 MEQ PO TBCR
40.0000 meq | EXTENDED_RELEASE_TABLET | Freq: Once | ORAL | Status: AC
  Administered 2024-03-02: 40 meq via ORAL
  Filled 2024-03-02: qty 2

## 2024-03-02 MED ORDER — POTASSIUM CHLORIDE 10 MEQ/100ML IV SOLN: 10.0000 meq | INTRAVENOUS | Status: DC

## 2024-03-02 MED ORDER — POTASSIUM CHLORIDE 20 MEQ PO PACK: 40.0000 meq | PACK | Freq: Once | ORAL | Status: DC

## 2024-03-02 MED ORDER — POTASSIUM CHLORIDE CRYS ER 20 MEQ PO TBCR
20.0000 meq | EXTENDED_RELEASE_TABLET | Freq: Two times a day (BID) | ORAL | 0 refills | Status: DC
  Filled 2024-03-02: qty 60, 30d supply, fill #0

## 2024-03-02 MED ORDER — POTASSIUM CHLORIDE 20 MEQ PO PACK
60.0000 meq | PACK | Freq: Three times a day (TID) | ORAL | Status: DC
  Administered 2024-03-02: 60 meq via ORAL
  Filled 2024-03-02: qty 3

## 2024-03-02 NOTE — Progress Notes (Signed)
 Anesthesia Chart Review: Same day workup  73 y.o.female with pertinent history including ESRD on peritoneal dialysis, T2DM on insulin  (A1c 7.7 on 02/10/2024), hypotension on midodrine , ischemic cardiomyopathy (EF normalized on echo 03/2023, 60 to 65% with grade 2 DD), PVD s/p left ATA stent on 11/30/2023 (maintained on Plavix ), HTN, [name] s/p NSTEMI and PCI (2022).  Recently underwent ligation of left AV fistula on 01/15/2024.  She has had multiple recent hospitalizations for hypotension.  Most recently 5/5 through 03/02/2024.  She reported vomiting for a week prior to admission.  She had hypokalemia that required significant replacement in hospital and she was discharged on 20 mEq twice daily until follow-up.  She was discharged with TED hose and continued on home midodrine  10 mg 3 times daily. Per discharge summary, "Pt is full code. Her prognosis is poor. Discussed with pt doing an advanced directive with her PCP. Poor full code candidate and would be more appropriate for DNR given low likelihood of recovery."  At discharge potassium was 2.7 and her blood pressure was 83/47.  She did have prior preop evaluation by cardiology APP Lawana Pray, NP on 02/19/2024.  Per note, "Chart reviewed as part of pre-operative protocol coverage. Given past medical history and time since last visit, based on ACC/AHA guidelines, Melissa Howe would be at acceptable risk for the planned procedure without further cardiovascular testing. Her RC RI is high risk, greater than 11% risk of major cardiac event.  She is able to complete greater than 4 METS of physical activity. Patient was advised that if she develops new symptoms prior to surgery to contact our office to arrange a follow-up appointment.  He verbalized understanding. Plavix  prescribed by a noncardiology provider (vascular surgery) therefore recommendations for holding deferred to prescribing provider. Ideally aspirin  should be continued without interruption, however if  the bleeding risk is too great, aspirin  may be held for 5-7 days prior to surgery. Please resume aspirin  post operatively when it is felt to be safe from a bleeding standpoint. "  Patient will need day of surgery evaluation.  EKG 02/29/2024: Sinus rhythm.  Rate 84. Atrial premature complexes. Short PR interval. Nonspecific intraventricular conduction delay. Borderline repolarization abnormality  Echo 04/15/2023:  1. Left ventricular ejection fraction, by estimation, is 60 to 65%. The left ventricle has normal function. The left ventricle has no regional wall motion abnormalities. Left ventricular diastolic parameters are consistent with Grade II diastolic dysfunction (pseudonormalization). Elevated left atrial pressure.  2. Right ventricular systolic function is normal. The right ventricular size is normal. Tricuspid regurgitation signal is inadequate for assessing PA pressure.  3. The mitral valve is degenerative. Trivial mitral valve regurgitation. Moderate mitral annular calcification.  4. The aortic valve is tricuspid. Aortic valve regurgitation is not visualized. No aortic stenosis is present.  5. The inferior vena cava is normal in size with greater than 50% respiratory variability, suggesting right atrial pressure of 3 mmHg.   Comparison(s): EF 35%, global hypokinesis. Compared to prior, LV systolic function has improved.     Melissa Howe [facility] Short Stay Center/Anesthesiology Phone [phone] 03/02/2024 3:55 PM

## 2024-03-02 NOTE — Progress Notes (Signed)
 SDW call  Patient's daughter, Melissa Howe was given pre-op instructions over the phone. She verbalized understanding of instructions provided. Patient was discharged from [facility] hospital today (5/5-5/7).  Crystal states they do home peritoneal dialysis at midnight and 0800.       PCP - Gaetana Jones, FNP Cardiologist - Dr. Jackquelyn Mass Pulmonary:    PPM/ICD - denies Device Orders - na Rep Notified - na   Chest x-ray - 02/29/2024 EKG -  02/29/2024 Stress Test -  ECHO - 04/15/2023 Cardiac Cath - 9/16/20222  Sleep Study/sleep apnea/CPAP: denies  Type II diabetic:  Fasting Blood sugar range:  around 200 How often check sugars: daily Lantus , 20 units if over 200.  Instructed to give 10 units if over 200 which is regular dose Tradjenta , hold DOS   Blood Thinner Instructions: Plavix , states has been on hold since 02/26/2024  Aspirin  Instructions: Daughter states she was instructed upon discharged to continue ASA   ERAS Protcol - Clears until 0500  Anesthesia review: Yes. HTN, MI, CHF, DM, [name], PVD, recent hospital admission    Patient denies shortness of breath, fever, cough and chest pain over the phone call  Your procedure is scheduled on Thursday Mar 03, 2024  Report to [facility] Main Entrance "A" at  0530  A.M., then check in with the Admitting office.  Call this number if you have problems the morning of surgery:  [phone]   If you have any questions prior to your surgery date call [phone]: Open Monday-Friday 8am-4pm If you experience any cold or flu symptoms such as cough, fever, chills, shortness of breath, etc. between now and your scheduled surgery, please notify us  at the above number     Remember:  Do not eat after midnight the night before your surgery  You may drink clear liquids until 0500    the morning of your surgery.   Clear liquids allowed are: Water, Non-Citrus Juices (without pulp), Carbonated Beverages, Clear Tea, Black Coffee ONLY (NO MILK, CREAM OR POWDERED  CREAMER of any kind), and Gatorade   Take these medicines the morning of surgery with A SIP OF WATER:  ASA, sensipar , gabapentin , midodrine   As needed: Tylenol , zofran   As of today, STOP taking any Aleve, Naproxen, Ibuprofen, Motrin, Advil, Goody's, BC's, all herbal medications, fish oil, and all vitamins.

## 2024-03-02 NOTE — Inpatient Diabetes Management (Signed)
 Inpatient Diabetes Program Recommendations  AACE/ADA: New Consensus Statement on Inpatient Glycemic Control   Target Ranges:  Prepandial:   less than 140 mg/dL      Peak postprandial:   less than 180 mg/dL (1-2 hours)      Critically ill patients:  140 - 180 mg/dL    Latest Reference Range & Units 03/01/24 06:31 03/01/24 13:17 03/01/24 16:08 03/01/24 21:12 03/02/24 06:50  Glucose-Capillary 70 - 99 mg/dL 161 (H) 096 (H) 045 (H) 176 (H) 172 (H)   Review of Glycemic Control  Diabetes history: DM2 Outpatient Diabetes medications: Tradjenta  5 mg daily, Lantus  20 units daily Current orders for Inpatient glycemic control: Semglee  20 units daily, Novolog  0-6 units TID with meals  Inpatient Diabetes Program Recommendations:    Insulin : Please consider ordering Novolog  3 units TID with meals for meal coverage if patient eats at least 50% of meals.  Thanks, Beacher Limerick, RN, MSN, CDCES Diabetes Coordinator Inpatient Diabetes Program [phone] (Team Pager from 8am to 5pm)

## 2024-03-02 NOTE — Anesthesia Preprocedure Evaluation (Signed)
 Anesthesia Evaluation  Patient identified by MRN, date of birth, ID band Patient awake    Reviewed: Allergy & Precautions, NPO status , Patient's Chart, lab work & pertinent test results  Airway Mallampati: I  TM Distance: >3 FB Neck ROM: Full    Dental  (+) Dental Advisory Given, Missing   Pulmonary former smoker   breath sounds clear to auscultation       Cardiovascular hypertension, + [name], + Past MI, + Peripheral Vascular Disease and +CHF   Rhythm:Regular Rate:Normal     Neuro/Psych  Headaches PSYCHIATRIC DISORDERS  Depression       GI/Hepatic Neg liver ROS, PUD,,,  Endo/Other  diabetes, Type 2, Insulin  Dependent    Renal/GU Renal disease     Musculoskeletal   Abdominal   Peds  Hematology  (+) Blood dyscrasia, anemia   Anesthesia Other Findings   Reproductive/Obstetrics                             Anesthesia Physical Anesthesia Plan  ASA: 3  Anesthesia Plan: General   Post-op Pain Management: Tylenol  PO (pre-op)*   Induction: Intravenous  PONV Risk Score and Plan: 4 or greater and Ondansetron  and Treatment may vary due to age or medical condition  Airway Management Planned: LMA  Additional Equipment: None  Intra-op Plan:   Post-operative Plan: Extubation in OR  Informed Consent:   Plan Discussed with: CRNA  Anesthesia Plan Comments: (Dialysis yesterday on 5.7.25 to correct lab abnormalities.  PAT note by Rudy Costain, PA-C: 73 y.o.female with pertinent history including ESRD on peritoneal dialysis, T2DM on insulin  (A1c 7.7 on 02/10/2024), hypotension on midodrine , ischemic cardiomyopathy (EF normalized on echo 03/2023, 60 to 65% with grade 2 DD), PVD s/p left ATA stent on 11/30/2023 (maintained on Plavix ), HTN, [name] s/p NSTEMI and PCI (2022).  Recently underwent ligation of left AV fistula on 01/15/2024.  She has had multiple recent hospitalizations for hypotension.  Most  recently 5/5 through 03/02/2024.  She reported vomiting for a week prior to admission.  She had hypokalemia that required significant replacement in hospital and she was discharged on 20 mEq twice daily until follow-up.  She was discharged with TED hose and continued on home midodrine  10 mg 3 times daily. Per discharge summary, "Pt is full code. Her prognosis is poor. Discussed with pt doing an advanced directive with her PCP. Poor full code candidate and would be more appropriate for DNR given low likelihood of recovery."  At discharge potassium was 2.7 and her blood pressure was 83/47.  She did have prior preop evaluation by cardiology APP Lawana Pray, NP on 02/19/2024.  Per note, "Chart reviewed as part of pre-operative protocol coverage. Given past medical history and time since last visit, based on ACC/AHA guidelines, Melissa Howe would be at acceptable risk for the planned procedure without further cardiovascular testing. Her RC RI is high risk, greater than 11% risk of major cardiac event.  She is able to complete greater than 4 METS of physical activity. Patient was advised that if she develops new symptoms prior to surgery to contact our office to arrange a follow-up appointment.  He verbalized understanding. Plavixprescribed by anoncardiology provider (vascular surgery) therefore recommendations for holding deferred to prescribing provider. Ideally aspirin  should be continued without interruption, however if the bleeding risk is too great, aspirin  may be held for 5-7 days prior to surgery. Please resume aspirin  post operatively when it is felt to  be safe from a bleeding standpoint. "  Patient will need day of surgery evaluation.  EKG 02/29/2024: Sinus rhythm.  Rate 84. Atrial premature complexes. Short PR interval. Nonspecific intraventricular conduction delay. Borderline repolarization abnormality  Echo 04/15/2023:  1. Left ventricular ejection fraction, by estimation, is 60 to 65%. The left  ventricle has normal function. The left ventricle has no regional wall motion abnormalities. Left ventricular diastolic parameters are consistent with Grade II diastolic dysfunction (pseudonormalization). Elevated left atrial pressure.  2. Right ventricular systolic function is normal. The right ventricular size is normal. Tricuspid regurgitation signal is inadequate for assessing PA pressure.  3. The mitral valve is degenerative. Trivial mitral valve regurgitation. Moderate mitral annular calcification.  4. The aortic valve is tricuspid. Aortic valve regurgitation is not visualized. No aortic stenosis is present.  5. The inferior vena cava is normal in size with greater than 50% respiratory variability, suggesting right atrial pressure of 3 mmHg.  Comparison(s): EF 35%, global hypokinesis. Compared to prior, LV systolic function has improved.  )        Anesthesia Quick Evaluation

## 2024-03-02 NOTE — Progress Notes (Signed)
 Date and time results received: 03/02/24 1009   Test: Potassium Critical Value: 2.7  Name of Provider Notified: Verdell Given, MD  Orders Received? Or Actions Taken?:  PO potassium admin per orders

## 2024-03-02 NOTE — Progress Notes (Signed)
 [facility] Progress Note   Subjective:   No further emesis or diarrhea.  Feels remarkably improved.  No issues with PD overnight.  Eating good breakfast.  Hopeful for d/c.  No f/c/dyspnea/edema.  Objective Vitals:   03/02/24 0217 03/02/24 0404 03/02/24 0625 03/02/24 0733  BP: 109/69 (!) 83/48 (!) 97/55 (!) 99/55  Pulse:  95 62 (!) 50  Resp:  15 16 14   Temp:  (!) 97.4 F (36.3 C)  97.7 F (36.5 C)  TempSrc:  Oral  Oral  SpO2:  97% 97% 100%  Weight:  74.5 kg    Height:       Physical Exam Gen: appearing comfortable and more energetic Eyes: anicteric., EOMI ENT: MMM Neck: supple, no JVD CV:  SB, no rub Abd:  soft, nontender, LLQ PD catheter dressing intact, no drainage or tenderness Lungs: clear on RA Extr: no edema, L arm with clotted forearm AVG and ligated BC AVF.  Necrotic dry tip of L 5th finger (ischemic), no erythema or drainage Neuro: nonfocal Skin: no rashes  Additional Objective Labs: Basic Metabolic Panel: Recent Labs  Lab 02/29/24 0516 03/01/24 0351 03/02/24 0303  NA 132* 133* 131*  K 2.7* 3.0* 2.4*  CL 92* 94* 93*  CO2 22 22 21*  GLUCOSE 335* 176* 194*  BUN 21 28* 26*  CREATININE 7.37* 7.89* 6.94*  CALCIUM  8.1* 8.6* 8.6*  PHOS 3.2 3.1 3.0   Liver Function Tests: Recent Labs  Lab 02/29/24 0205 02/29/24 0516 03/01/24 0351 03/02/24 0303  AST 18  --   --   --   ALT 16  --   --   --   ALKPHOS 115  --   --   --   BILITOT 0.7  --   --   --   PROT 6.8  --   --   --   ALBUMIN  2.3* 2.0* 1.8* 1.9*   No results for input(s): "LIPASE", "AMYLASE" in the last 168 hours. CBC: Recent Labs  Lab 02/29/24 0205 02/29/24 0222 03/01/24 0351  WBC 14.5*  --  13.7*  NEUTROABS 10.8*  --   --   HGB 16.3* 18.0* 14.9  HCT 49.9* 53.0* 45.8  MCV 91.9  --  91.4  PLT 242  --  243   Blood Culture    Component Value Date/Time   SDES BLOOD BLOOD RIGHT HAND 02/29/2024 2223   SPECREQUEST  02/29/2024 2223    BOTTLES DRAWN AEROBIC AND ANAEROBIC  Blood Culture results may not be optimal due to an inadequate volume of blood received in culture bottles   CULT  02/29/2024 2223    NO GROWTH 2 DAYS Performed at [facility], [address] [place], [place] [address]    REPTSTATUS PENDING 02/29/2024 2223    Cardiac Enzymes: No results for input(s): "CKTOTAL", "CKMB", "CKMBINDEX", "TROPONINI" in the last 168 hours. CBG: Recent Labs  Lab 03/01/24 0631 03/01/24 1317 03/01/24 1608 03/01/24 2112 03/02/24 0650  GLUCAP 200* 452* 254* 176* 172*   Iron  Studies: No results for input(s): "IRON ", "TIBC", "TRANSFERRIN", "FERRITIN" in the last 72 hours. @lablastinr3 @ Studies/Results: DG Abd 1 View Result Date: 03/01/2024 CLINICAL DATA:  [id number] Constipation [id number]. EXAM: ABDOMEN - 1 VIEW COMPARISON:  07/09/2021. FINDINGS: The bowel gas pattern is non-obstructive.  No abnormal stool burden. No evidence of pneumoperitoneum, within the limitations of a supine film. No acute osseous abnormalities. The soft tissues are within normal limits. Surgical changes, devices, tubes and lines: None. IMPRESSION: *Nonobstructive bowel gas  pattern. Electronically Signed   By: Beula Brunswick M.D.   On: 03/01/2024 17:46   Medications:  ceFEPime  (MAXIPIME ) IV 1 g (03/01/24 0408)   dialysis solution 1.5% low-MG/low-CA      aspirin   81 mg Oral Daily   atorvastatin   80 mg Oral QHS   calcitRIOL   0.25 mcg Oral Daily   clopidogrel   75 mg Oral Daily   feeding supplement  237 mL Oral BID BM   gabapentin   100 mg Oral BID   gentamicin  cream  1 Application Topical Daily   heparin  injection (subcutaneous)  5,000 Units Subcutaneous Q8H   insulin  aspart  0-6 Units Subcutaneous TID WC   insulin  glargine-yfgn  20 Units Subcutaneous Daily   midodrine   10 mg Oral TID WC   multivitamin  1 tablet Oral QHS   polyethylene glycol  17 g Oral Daily    Assessment/PlanJean Evans Howe is an 73 y.o. female with ESRD on CAPD, [name], DM, anemia, [name], N/V/D and nephrology is consulted for comanagement of ESRD.    **hypotension, shock: Lactate 3.  In setting of poor po intake, N/V and improved with IVF.  Midodrine  10 TID.  Afebrile but with N/V/D - r/o'd GI infection and symptoms improved.  Peritoneal fluid clean.    **ESRD on CAPD:  cell count normal per above.  Typically does CAPD but will do CCPD while admitted - pt aware.  Cont w 1.5% dialysate tonight.  Rec she continue 1.5% dialysate at home tonight.   **Hypokalemia: common in PD + N/V + poor po intake.  Was on KCl 20 daily PTA - 2.7 this AM.  In light of d/c recommended 60 now, 40 early afternoon prior to d/c and 20 BID until labs outpt - rec she go Friday - missed monthly labs with hospitalization so due anyway and needs K followed up.    **volume: on exam looks euvolemic currently, as above suspect she had hypovolemia in setting of N/V/D which improved with IVF. 1.5% dialysate.     **secondary hyperPTH:  cont binders, ca ok.  Cont VDRA.    Will follow.  OK for d/c, d/w primary team  Adrian Alba MD 03/02/2024, 9:27 AM   [facility] Pager: [phone]

## 2024-03-02 NOTE — Discharge Instructions (Addendum)
 Dear Lacretia Piccolo Kestner,  Thank you for letting us  participate in your care. You were hospitalized for low blood pressures and diagnosed with Hypotension. You were treated with ruling out infection, IV fluids, and dialysis as usual. Your low blood pressures appear to be dropping from chronic dialysis.   POST-HOSPITAL & CARE INSTRUCTIONS We are giving the potassium twice daily now. Please take this until vomiting stops.  Follow up with your primary care doctor as soon as possible. We recommend have discussion with your doctor about advanced directives including code status.  Follow up with your nephrologist.  Go to your follow up appointments (listed below)   DOCTOR'S APPOINTMENT   Future Appointments  Date Time Provider Department Center  04/20/2024  8:30 AM [facility]  04/20/2024  9:00 AM [facility]  04/20/2024  9:45 AM [facility] PA-2 [facility]     Take care and be well!  Family Medicine Teaching Service Inpatient Team [facility]  [facility]  [address] [place], [place] [address] [phone]

## 2024-03-02 NOTE — Progress Notes (Signed)
 Physical Therapy Treatment Patient Details Name: Melissa Howe MRN: [id number] DOB: 10/16/1951 Today's Date: 03/02/2024   History of Present Illness Pt is a 73 y/o F admitted on 02/29/24 after presenting with c/o vomiting & brief, resolved period of altered consciousness, & hyperglycemia. Pt is being treated for hypotension 2/2 overdialysis vs dehydration. 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 Pt demonstrated bed mobility and sit<>stand transfer with CGA for safety, however pt unable to progress gait this session, due to low BP. Pt completed x3 30sec rounds of seated marching and air punches in attempt to raise BP, with BP at 64/55 (60) afterwards. Pt completed step pivot transfer with RW support and minA to steady and for hand placement on chair with BP at 75/54 (61) post transfer. Pt BP at 86/45 (58) with pt reclined and feet elevated, and pt asymptomatic throughout session. MD notified and spoke with RN about ted hose, with the nurse secretary ordering them. Pt continues to benefit from skilled PT services to progress toward functional mobility goals.      If plan is discharge home, recommend the following: A little help with walking and/or transfers;A little help with bathing/dressing/bathroom;Assistance with cooking/housework;Assist for transportation;Help with stairs or ramp for entrance   Can travel by private vehicle        Equipment Recommendations  None recommended by PT    Recommendations for Other Services       Precautions / Restrictions Precautions Precautions: Fall Recall of Precautions/Restrictions: Intact Restrictions Weight Bearing Restrictions Per Provider Order: No     Mobility  Bed Mobility Overal bed mobility: Needs Assistance Bed Mobility: Supine to Sit     Supine to sit: Supervision     General bed mobility comments: incr time  to come to EOB    Transfers Overall transfer level: Needs assistance Equipment used: Rolling walker (2 wheels) Transfers: Sit to/from Stand, Bed to chair/wheelchair/BSC Sit to Stand: Contact guard assist   Step pivot transfers: Min assist       General transfer comment: CGA for safety with sit<>stand from EOB, minA for steadying and hand placement with step pivot    Ambulation/Gait               General Gait Details: deferred due to low BP   Stairs             Wheelchair Mobility     Tilt Bed    Modified [place] (Stroke Patients Only)       Balance Overall balance assessment: Needs assistance Sitting-balance support: Feet supported, No upper extremity supported Sitting balance-Leahy Scale: Good Sitting balance - Comments: supervision sitting EOB   Standing balance support: Reliant on assistive device for balance Standing balance-Leahy Scale: Fair Standing balance comment: Pt dependent on RW support                            Communication Communication Communication: No apparent difficulties  Cognition Arousal: Alert Behavior During Therapy: WFL for tasks assessed/performed   PT - Cognitive impairments: No apparent impairments                         Following commands: Intact      Cueing Cueing Techniques: Verbal cues  Exercises Other Exercises Other Exercises: seated marching and air punches x30 seconds  x3    General Comments General comments (skin integrity, edema, etc.): 91/52 (63) reclined in bed on arrival, 64/47 (54) seated up EOB, 64/55 (60) after seated marching and air punches x30 seconds x3, 75/54 (61) post transfer to chair, 86/45 (58) reclined in chair with pt asymptomatic throughout session      Pertinent Vitals/Pain Pain Assessment Pain Assessment: No/denies pain Pain Intervention(s): Monitored during session    Home Living                          Prior Function            PT Goals  (current goals can now be found in the care plan section) Acute Rehab PT Goals PT Goal Formulation: With patient Time For Goal Achievement: 03/14/24 Progress towards PT goals: Not progressing toward goals - comment (unable to progress due to low BP)    Frequency    Min 2X/week      PT Plan      Co-evaluation              AM-PAC PT "6 Clicks" Mobility   Outcome Measure  Help needed turning from your back to your side while in a flat bed without using bedrails?: None Help needed moving from lying on your back to sitting on the side of a flat bed without using bedrails?: A Little Help needed moving to and from a bed to a chair (including a wheelchair)?: A Little Help needed standing up from a chair using your arms (e.g., wheelchair or bedside chair)?: A Little Help needed to walk in hospital room?: A Little Help needed climbing 3-5 steps with a railing? : A Lot 6 Click Score: 18    End of Session   Activity Tolerance: Treatment limited secondary to medical complications (Comment) (low BP) Patient left: in chair;with call bell/phone within reach;with chair alarm set Nurse Communication: Mobility status;Other (comment) (BP) PT Visit Diagnosis: Muscle weakness (generalized) (M62.81);Other abnormalities of gait and mobility (R26.89);Unsteadiness on feet (R26.81)     Time: 5621-3086 PT Time Calculation (min) (ACUTE ONLY): 34 min  Charges:    $Therapeutic Activity: 23-37 mins PT General Charges $$ ACUTE PT VISIT: 1 Visit                     Forrest Iha 03/02/2024, 1:12 PM

## 2024-03-02 NOTE — Plan of Care (Signed)
  Problem: Pain Managment: Goal: General experience of comfort will improve and/or be controlled Outcome: Progressing   Problem: Safety: Goal: Ability to remain free from injury will improve Outcome: Progressing   Problem: Skin Integrity: Goal: Risk for impaired skin integrity will decrease Outcome: Progressing

## 2024-03-03 ENCOUNTER — Other Ambulatory Visit: Payer: Self-pay

## 2024-03-03 ENCOUNTER — Encounter (HOSPITAL_COMMUNITY)
Admission: RE | Disposition: A | Discharge status: Home / Self Care | Payer: Self-pay | Source: Home / Self Care | Attending: Orthopedic Surgery

## 2024-03-03 ENCOUNTER — Ambulatory Visit (HOSPITAL_COMMUNITY)
Admission: RE | Admit: 2024-03-03 | Discharge: 2024-03-03 | Disposition: A | Discharge status: Home / Self Care | Attending: Orthopedic Surgery | Admitting: Orthopedic Surgery

## 2024-03-03 ENCOUNTER — Ambulatory Visit (HOSPITAL_BASED_OUTPATIENT_CLINIC_OR_DEPARTMENT_OTHER): Payer: Self-pay | Admitting: Physician Assistant

## 2024-03-03 ENCOUNTER — Ambulatory Visit (HOSPITAL_COMMUNITY): Payer: Self-pay | Admitting: Physician Assistant

## 2024-03-03 ENCOUNTER — Encounter (HOSPITAL_COMMUNITY): Payer: Self-pay | Admitting: Orthopedic Surgery

## 2024-03-03 DIAGNOSIS — Z833 Family history of diabetes mellitus: Secondary | ICD-10-CM | POA: Diagnosis not present

## 2024-03-03 DIAGNOSIS — I132 Hypertensive heart and chronic kidney disease with heart failure and with stage 5 chronic kidney disease, or end stage renal disease: Secondary | ICD-10-CM | POA: Insufficient documentation

## 2024-03-03 DIAGNOSIS — Z992 Dependence on renal dialysis: Secondary | ICD-10-CM | POA: Diagnosis not present

## 2024-03-03 DIAGNOSIS — I5032 Chronic diastolic (congestive) heart failure: Secondary | ICD-10-CM

## 2024-03-03 DIAGNOSIS — E876 Hypokalemia: Secondary | ICD-10-CM | POA: Insufficient documentation

## 2024-03-03 DIAGNOSIS — E1122 Type 2 diabetes mellitus with diabetic chronic kidney disease: Secondary | ICD-10-CM | POA: Insufficient documentation

## 2024-03-03 DIAGNOSIS — I509 Heart failure, unspecified: Secondary | ICD-10-CM | POA: Insufficient documentation

## 2024-03-03 DIAGNOSIS — I2510 Atherosclerotic heart disease of native coronary artery without angina pectoris: Secondary | ICD-10-CM

## 2024-03-03 DIAGNOSIS — I255 Ischemic cardiomyopathy: Secondary | ICD-10-CM | POA: Diagnosis not present

## 2024-03-03 DIAGNOSIS — Z955 Presence of coronary angioplasty implant and graft: Secondary | ICD-10-CM | POA: Insufficient documentation

## 2024-03-03 DIAGNOSIS — Z8249 Family history of ischemic heart disease and other diseases of the circulatory system: Secondary | ICD-10-CM | POA: Diagnosis not present

## 2024-03-03 DIAGNOSIS — I110 Hypertensive heart disease with heart failure: Secondary | ICD-10-CM | POA: Diagnosis not present

## 2024-03-03 DIAGNOSIS — I96 Gangrene, not elsewhere classified: Secondary | ICD-10-CM

## 2024-03-03 DIAGNOSIS — N186 End stage renal disease: Secondary | ICD-10-CM | POA: Diagnosis not present

## 2024-03-03 DIAGNOSIS — Z87891 Personal history of nicotine dependence: Secondary | ICD-10-CM | POA: Insufficient documentation

## 2024-03-03 DIAGNOSIS — Z794 Long term (current) use of insulin: Secondary | ICD-10-CM | POA: Insufficient documentation

## 2024-03-03 DIAGNOSIS — Z7984 Long term (current) use of oral hypoglycemic drugs: Secondary | ICD-10-CM | POA: Diagnosis not present

## 2024-03-03 DIAGNOSIS — Z8711 Personal history of peptic ulcer disease: Secondary | ICD-10-CM | POA: Diagnosis not present

## 2024-03-03 DIAGNOSIS — E1152 Type 2 diabetes mellitus with diabetic peripheral angiopathy with gangrene: Secondary | ICD-10-CM | POA: Diagnosis present

## 2024-03-03 DIAGNOSIS — I252 Old myocardial infarction: Secondary | ICD-10-CM | POA: Diagnosis not present

## 2024-03-03 HISTORY — PX: SKIN DEBRIDEMENT: SHX5235

## 2024-03-03 LAB — BODY FLUID CULTURE W GRAM STAIN: Gram Stain: NONE SEEN

## 2024-03-03 LAB — GLUCOSE, CAPILLARY
Glucose-Capillary: 252 mg/dL — ABNORMAL HIGH (ref 70–99)
Glucose-Capillary: 226 mg/dL — ABNORMAL HIGH (ref 70–99)

## 2024-03-03 SURGERY — DEBRIDEMENT, SKIN, PARTIAL-THICKNESS
Anesthesia: General | Site: Hand | Laterality: Left

## 2024-03-03 MED ORDER — CHLORHEXIDINE GLUCONATE 0.12 % MT SOLN
15.0000 mL | Freq: Once | OROMUCOSAL | Status: AC
  Administered 2024-03-03: 15 mL via OROMUCOSAL
  Filled 2024-03-03: qty 15

## 2024-03-03 MED ORDER — OXYCODONE HCL 5 MG PO TABS: 5.0000 mg | ORAL_TABLET | Freq: Once | ORAL | Status: DC | PRN

## 2024-03-03 MED ORDER — LIDOCAINE 2% (20 MG/ML) 5 ML SYRINGE
INTRAMUSCULAR | Status: DC | PRN
  Administered 2024-03-03: 20 mg via INTRAVENOUS

## 2024-03-03 MED ORDER — SODIUM CHLORIDE 0.9% FLUSH: 3.0000 mL | INTRAVENOUS | Status: DC | PRN

## 2024-03-03 MED ORDER — INSULIN ASPART 100 UNIT/ML IJ SOLN: 0.0000 [IU] | INTRAMUSCULAR | Status: DC | PRN

## 2024-03-03 MED ORDER — OXYCODONE HCL 5 MG/5ML PO SOLN: 5.0000 mg | Freq: Once | ORAL | Status: DC | PRN

## 2024-03-03 MED ORDER — CEFAZOLIN SODIUM-DEXTROSE 2-4 GM/100ML-% IV SOLN
2.0000 g | INTRAVENOUS | Status: AC
  Administered 2024-03-03: 2 g via INTRAVENOUS
  Filled 2024-03-03: qty 100

## 2024-03-03 MED ORDER — LIDOCAINE 2% (20 MG/ML) 5 ML SYRINGE
INTRAMUSCULAR | Status: AC
  Filled 2024-03-03: qty 5

## 2024-03-03 MED ORDER — SODIUM CHLORIDE 0.9% FLUSH: 3.0000 mL | Freq: Two times a day (BID) | INTRAVENOUS | Status: DC

## 2024-03-03 MED ORDER — ORAL CARE MOUTH RINSE: 15.0000 mL | Freq: Once | OROMUCOSAL | Status: AC

## 2024-03-03 MED ORDER — ACETAMINOPHEN 500 MG PO TABS
1000.0000 mg | ORAL_TABLET | Freq: Once | ORAL | Status: AC
  Administered 2024-03-03: 1000 mg via ORAL
  Filled 2024-03-03: qty 2

## 2024-03-03 MED ORDER — BUPIVACAINE HCL (PF) 0.25 % IJ SOLN
INTRAMUSCULAR | Status: AC
  Filled 2024-03-03: qty 30

## 2024-03-03 MED ORDER — ONDANSETRON HCL 4 MG/2ML IJ SOLN
INTRAMUSCULAR | Status: AC
  Filled 2024-03-03: qty 2

## 2024-03-03 MED ORDER — FENTANYL CITRATE (PF) 100 MCG/2ML IJ SOLN: 25.0000 ug | INTRAMUSCULAR | Status: DC | PRN

## 2024-03-03 MED ORDER — ALBUMIN HUMAN 5 % IV SOLN: INTRAVENOUS | Status: DC | PRN

## 2024-03-03 MED ORDER — PHENYLEPHRINE HCL-NACL 20-0.9 MG/250ML-% IV SOLN
INTRAVENOUS | Status: AC
  Filled 2024-03-03: qty 500

## 2024-03-03 MED ORDER — PROPOFOL 10 MG/ML IV BOLUS
INTRAVENOUS | Status: DC | PRN
  Administered 2024-03-03: 60 mg via INTRAVENOUS

## 2024-03-03 MED ORDER — ONDANSETRON HCL 4 MG/2ML IJ SOLN
INTRAMUSCULAR | Status: DC | PRN
  Administered 2024-03-03: 4 mg via INTRAVENOUS

## 2024-03-03 MED ORDER — PHENYLEPHRINE HCL-NACL 20-0.9 MG/250ML-% IV SOLN
INTRAVENOUS | Status: DC | PRN
  Administered 2024-03-03: 25 ug/min via INTRAVENOUS

## 2024-03-03 MED ORDER — DROPERIDOL 2.5 MG/ML IJ SOLN: 0.6250 mg | Freq: Once | INTRAMUSCULAR | Status: DC | PRN

## 2024-03-03 MED ORDER — PROPOFOL 10 MG/ML IV BOLUS
INTRAVENOUS | Status: AC
  Filled 2024-03-03: qty 20

## 2024-03-03 MED ORDER — MIDAZOLAM HCL 2 MG/2ML IJ SOLN
INTRAMUSCULAR | Status: AC
  Filled 2024-03-03: qty 2

## 2024-03-03 MED ORDER — SODIUM CHLORIDE 0.9 % IV SOLN: INTRAVENOUS | Status: DC | PRN

## 2024-03-03 MED ORDER — INSULIN ASPART 100 UNIT/ML IJ SOLN
INTRAMUSCULAR | Status: AC
  Administered 2024-03-03: 3 [IU] via SUBCUTANEOUS
  Filled 2024-03-03: qty 1

## 2024-03-03 MED ORDER — ACETAMINOPHEN 10 MG/ML IV SOLN: 1000.0000 mg | Freq: Once | INTRAVENOUS | Status: DC | PRN

## 2024-03-03 MED ORDER — BUPIVACAINE-EPINEPHRINE (PF) 0.25% -1:200000 IJ SOLN
INTRAMUSCULAR | Status: AC
  Filled 2024-03-03: qty 30

## 2024-03-03 SURGICAL SUPPLY — 35 items
BAG COUNTER SPONGE SURGICOUNT (BAG) ×2 IMPLANT
BLADE LONG MED 31X9 (MISCELLANEOUS) ×2 IMPLANT
BNDG COHESIVE 1X5 TAN STRL LF (GAUZE/BANDAGES/DRESSINGS) ×2 IMPLANT
BNDG ELASTIC 4INX 5YD STR LF (GAUZE/BANDAGES/DRESSINGS) ×1 IMPLANT
BNDG ESMARK 4X9 LF (GAUZE/BANDAGES/DRESSINGS) ×2 IMPLANT
BNDG GAUZE DERMACEA FLUFF 4 (GAUZE/BANDAGES/DRESSINGS) ×1 IMPLANT
CHLORAPREP W/TINT 26 (MISCELLANEOUS) ×2 IMPLANT
CORD BIPOLAR FORCEPS 12FT (ELECTRODE) ×2 IMPLANT
COVER SURGICAL LIGHT HANDLE (MISCELLANEOUS) ×1 IMPLANT
CUFF TOURN SGL QUICK 18X4 (TOURNIQUET CUFF) ×2 IMPLANT
CUFF TRNQT CYL 24X4X16.5-23 (TOURNIQUET CUFF) IMPLANT
GAUZE SPONGE 2X2 STRL 8-PLY (GAUZE/BANDAGES/DRESSINGS) ×1 IMPLANT
GAUZE SPONGE 4X4 12PLY STRL (GAUZE/BANDAGES/DRESSINGS) IMPLANT
GAUZE SPONGE 4X4 12PLY STRL LF (GAUZE/BANDAGES/DRESSINGS) ×1 IMPLANT
GAUZE XEROFORM 1X8 LF (GAUZE/BANDAGES/DRESSINGS) ×2 IMPLANT
GLOVE BIO SURGEON STRL SZ7.5 (GLOVE) ×2 IMPLANT
GLOVE BIOGEL PI IND STRL 8 (GLOVE) ×2 IMPLANT
GOWN STRL REUS W/ TWL LRG LVL3 (GOWN DISPOSABLE) ×2 IMPLANT
GOWN STRL REUS W/ TWL XL LVL3 (GOWN DISPOSABLE) ×2 IMPLANT
KIT BASIN OR (CUSTOM PROCEDURE TRAY) ×2 IMPLANT
KIT TURNOVER KIT B (KITS) ×2 IMPLANT
NDL HYPO 25GX1X1/2 BEV (NEEDLE) IMPLANT
NEEDLE HYPO 25GX1X1/2 BEV (NEEDLE) ×2 IMPLANT
NS IRRIG 1000ML POUR BTL (IV SOLUTION) ×2 IMPLANT
PACK ORTHO EXTREMITY (CUSTOM PROCEDURE TRAY) ×2 IMPLANT
PAD ARMBOARD POSITIONER FOAM (MISCELLANEOUS) ×4 IMPLANT
PAD CAST 4YDX4 CTTN HI CHSV (CAST SUPPLIES) IMPLANT
SPECIMEN JAR SMALL (MISCELLANEOUS) ×2 IMPLANT
SUT CHROMIC 6 0 PS 4 (SUTURE) IMPLANT
SUT MON AB 5-0 PS2 18 (SUTURE) IMPLANT
SUT VIC AB 4-0 PS2 18 (SUTURE) IMPLANT
SYR CONTROL 10ML LL (SYRINGE) IMPLANT
TOWEL GREEN STERILE (TOWEL DISPOSABLE) ×2 IMPLANT
TUBE CONNECTING 12X1/4 (SUCTIONS) ×1 IMPLANT
UNDERPAD 30X36 HEAVY ABSORB (UNDERPADS AND DIAPERS) ×2 IMPLANT

## 2024-03-03 NOTE — Progress Notes (Signed)
  Progress Note   Date: 03/02/2024  Patient Name: Melissa Howe        MRN#: [id number]  Clarification of diagnosis:  Severe malnutrition

## 2024-03-03 NOTE — Op Note (Signed)
 NAME: Melissa Howe MEDICAL RECORD NO: [id number] DATE OF BIRTH: 08-07-1951 FACILITY[facility] LOCATION: [facility] OR PHYSICIAN: Terrel Nesheiwat R. Maurica Omura, MD   OPERATIVE REPORT   DATE OF PROCEDURE: 03/03/24    PREOPERATIVE DIAGNOSIS: Left small fingertip necrosis   POSTOPERATIVE DIAGNOSIS: Left small fingertip necrosis   PROCEDURE: Left small finger debridement of skin and treatment of nail   SURGEON:  Brunilda Capra, M.D.   ASSISTANT: none   ANESTHESIA:  General   INTRAVENOUS FLUIDS:  Per anesthesia flow sheet.   ESTIMATED BLOOD LOSS:  Minimal.   COMPLICATIONS:  None.   SPECIMENS:  none   TOURNIQUET TIME:   Left forearm: 5 minutes at 250 mmHg   DISPOSITION:  Stable to PACU.   INDICATIONS: 73 year old female on dialysis.  She states she sustained a cut to the tip of her left small finger approximately 6 months ago which did not heal.  She developed necrosis of the tip of the finger.  She wished to proceed with amputation of the distal aspect of the left small finger to achieve healing.  Risks, benefits and alternatives of surgery were discussed including the risks of blood loss, infection, damage to nerves, vessels, tendons, ligaments, bone for surgery, need for additional surgery, complications with wound healing, continued pain, stiffness, , need for repeat irrigation and debridement.  She voiced understanding of these risks and elected to proceed.  OPERATIVE COURSE:  After being identified preoperatively by myself,  the patient and I agreed on the procedure and site of the procedure.  The surgical site was marked.  Surgical consent had been signed. Preoperative IV antibiotic prophylaxis was given. She was transferred to the operating room and placed on the operating table in supine position with the Left upper extremity on an arm board.  General anesthesia was induced by the anesthesiologist.  Left upper extremity was prepped and draped in normal sterile orthopedic fashion.  A surgical pause  was performed between the surgeons, anesthesia, and operating room staff and all were in agreement as to the patient, procedure, and site of procedure.  Tourniquet at the distal aspect of the forearm distal to her previous fistula was inflated to 250 mmHg after exsanguination of the hand with an Esmarch bandage. The dried scab at the tip of the finger was debrided off with the pickups.  The skin had healed underneath.  The skin appeared thinned but was intact.  There had been some loss of tissue at the ulnar side of the finger.  No erythema or sign of infection.  The fingernail was trimmed back with a pair of scissors.  The area was cleaned with a wet Ray-Tec sponge.  There was no open wound.  Decision was made not to proceed with amputation of the distal aspect of the finger.  The fingertip was then dressed with a sterile 4 x 4 and wrapped with a Coban dressing lightly.  The tourniquet was deflated at 5 minutes.  Fingertips were pink with brisk capillary refill after deflation of tourniquet.  The operative  drapes were broken down.  The patient was awoken from anesthesia safely.  She was transferred back to the stretcher and taken to PACU in stable condition.  I will see her back in the office in 1 week for postoperative followup.  Recommend Tylenol  as needed for pain.   Kionte Baumgardner, MD Electronically signed, 03/03/24

## 2024-03-03 NOTE — H&P (Signed)
 Melissa Howe is an 73 y.o. female.   Chief Complaint: finger necrosis HPI: 73 yo female on dialysis states she cut the tip of her left small finger 6 months ago and the wound would not heal.  The end of the finger has become gangrenous.  She wishes to proceed with amputation of the distal aspect of the left small finger.  Allergies: No Known Allergies  Past Medical History:  Diagnosis Date   Anemia    CHF (congestive heart failure) (HCC)    Constipation    Coronary artery disease    Diabetes mellitus    Type II   Dyspnea    with exertion   ESRD (end stage renal disease) (HCC)    dialyzing 3 times a day with peritoneal dialysis catheter that was placed in 04/2023   History of blood transfusion    Hyperlipemia    Hypertension    Ischemic cardiomyopathy    Myocardial infarction [facility])    Peripheral vascular disease (HCC)    Pneumonia 2019    Past Surgical History:  Procedure Laterality Date   ABDOMINAL AORTOGRAM W/LOWER EXTREMITY N/A 10/15/2022   Procedure: ABDOMINAL AORTOGRAM W/LOWER EXTREMITY;  Surgeon: Kayla Part, MD;  Location: [facility] INVASIVE CV LAB;  Service: Cardiovascular;  Laterality: N/A;   ABDOMINAL AORTOGRAM W/LOWER EXTREMITY N/A 11/30/2023   Procedure: ABDOMINAL AORTOGRAM W/LOWER EXTREMITY;  Surgeon: Adine Hoof, MD;  Location: [facility] INVASIVE CV LAB;  Service: Cardiovascular;  Laterality: N/A;   APPENDECTOMY     AV FISTULA PLACEMENT Left 10/05/2019   Procedure: INSERTION OF ARTERIOVENOUS (AV) GORE-TEX VASCULAR GRAFT LEFT ARM;  Surgeon: Mayo Speck, MD;  Location: [facility] OR;  Service: Vascular;  Laterality: Left;   AV FISTULA PLACEMENT Left 02/27/2021   Procedure: LEFT ARM FIRST STAGE BASILIC VEIN  ARTERIOVENOUS (AV) FISTULA CREATION;  Surgeon: Adine Hoof, MD;  Location: [facility] OR;  Service: Vascular;  Laterality: Left;   BASCILIC VEIN TRANSPOSITION Left 04/17/2021   Procedure: LEFT SECOND STAGE BASCILIC VEIN TRANSPOSITION;  Surgeon: Adine Hoof, MD;  Location: [facility] OR;  Service: Vascular;  Laterality: Left;   BIOPSY  07/24/2020   Procedure: BIOPSY;  Surgeon: Albertina Hugger, MD;  Location: [facility] ENDOSCOPY;  Service: Gastroenterology;;   BUBBLE STUDY  01/02/2020   Procedure: BUBBLE STUDY;  Surgeon: Euell Herrlich, MD;  Location: [facility] ENDOSCOPY;  Service: Cardiology;;   CAPD INSERTION N/A 05/01/2023   Procedure: LAPAROSCOPIC INSERTION CONTINUOUS AMBULATORY PERITONEAL DIALYSIS  (CAPD) CATHETER;  Surgeon: Adine Hoof, MD;  Location: [facility] OR;  Service: Vascular;  Laterality: N/A;   CARDIAC CATHETERIZATION     COLONOSCOPY WITH PROPOFOL  N/A 07/24/2020   Procedure: COLONOSCOPY WITH PROPOFOL ;  Surgeon: Albertina Hugger, MD;  Location: [facility] ENDOSCOPY;  Service: Gastroenterology;  Laterality: N/A;   CORONARY STENT INTERVENTION N/A 07/12/2021   Procedure: CORONARY STENT INTERVENTION;  Surgeon: Lucendia Rusk, MD;  Location: [facility] INVASIVE CV LAB;  Service: Cardiovascular;  Laterality: N/A;   CORONARY ULTRASOUND/IVUS N/A 07/12/2021   Procedure: Intravascular Ultrasound/IVUS;  Surgeon: Lucendia Rusk, MD;  Location: [facility] INVASIVE CV LAB;  Service: Cardiovascular;  Laterality: N/A;   ESOPHAGOGASTRODUODENOSCOPY (EGD) WITH PROPOFOL  N/A 07/24/2020   Procedure: ESOPHAGOGASTRODUODENOSCOPY (EGD) WITH PROPOFOL ;  Surgeon: Albertina Hugger, MD;  Location: [facility] ENDOSCOPY;  Service: Gastroenterology;  Laterality: N/A;   EYE SURGERY Bilateral    Cataract   FOREIGN BODY REMOVAL Left 05/13/2018   Procedure: FOREIGN BODY REMOVAL left foot;  Surgeon: Zandra Hew  Geralyn Knee, MD;  Location: [facility] OR;  Service: Orthopedics;  Laterality: Left;   I & D EXTREMITY Left 05/21/2018   Procedure: LEFT FOOT DEBRIDEMENT AND WOUND CLOSURE;  Surgeon: Timothy Ford, MD;  Location: [facility] OR;  Service: Orthopedics;  Laterality: Left;   LEFT HEART CATH AND CORONARY ANGIOGRAPHY N/A 07/12/2021   Procedure: LEFT HEART CATH AND CORONARY ANGIOGRAPHY;   Surgeon: Lucendia Rusk, MD;  Location: [facility] INVASIVE CV LAB;  Service: Cardiovascular;  Laterality: N/A;   LIGATION OF ARTERIOVENOUS  FISTULA Left 01/15/2024   Procedure: LIGATION OF ARTERIOVENOUS  FISTULA;  Surgeon: Adine Hoof, MD;  Location: [facility] OR;  Service: Vascular;  Laterality: Left;   PERIPHERAL VASCULAR BALLOON ANGIOPLASTY  11/30/2023   Procedure: PERIPHERAL VASCULAR BALLOON ANGIOPLASTY;  Surgeon: Adine Hoof, MD;  Location: [facility] INVASIVE CV LAB;  Service: Cardiovascular;;  left AT   PERIPHERAL VASCULAR INTERVENTION  11/30/2023   Procedure: PERIPHERAL VASCULAR INTERVENTION;  Surgeon: Adine Hoof, MD;  Location: [facility] INVASIVE CV LAB;  Service: Cardiovascular;;  stent left AT   POLYPECTOMY  07/24/2020   Procedure: POLYPECTOMY;  Surgeon: Albertina Hugger, MD;  Location: [facility] ENDOSCOPY;  Service: Gastroenterology;;   TEE WITHOUT CARDIOVERSION N/A 01/02/2020   Procedure: TRANSESOPHAGEAL ECHOCARDIOGRAM (TEE);  Surgeon: Euell Herrlich, MD;  Location: [facility] ENDOSCOPY;  Service: Cardiology;  Laterality: N/A;   TONSILLECTOMY     TUBAL LIGATION     UPPER EXTREMITY ANGIOGRAPHY Left 11/30/2023   Procedure: Upper Extremity Angiography;  Surgeon: Adine Hoof, MD;  Location: [facility] INVASIVE CV LAB;  Service: Cardiovascular;  Laterality: Left;    Family History: Family History  Problem Relation Age of Onset   Hypertension Mother    Diabetes Mother    Hypertension Sister     Social History:   reports that she quit smoking about 40 years ago. Her smoking use included cigarettes. She started smoking about 44 years ago. She has never been exposed to tobacco smoke. She has never used smokeless tobacco. She reports that she does not drink alcohol  and does not use drugs.  Medications: Medications Prior to Admission  Medication Sig Dispense Refill   aspirin  EC 81 MG tablet Take 81 mg by mouth daily. Swallow whole.     atorvastatin  (LIPITOR ) 80 MG tablet  Take 80 mg by mouth at bedtime.     calcitRIOL  (ROCALTROL ) 0.5 MCG capsule Take 0.5 mcg by mouth daily.     cinacalcet  (SENSIPAR ) 30 MG tablet Take 30 mg by mouth daily. With snack     gabapentin  (NEURONTIN ) 100 MG capsule Take 100 mg by mouth 2 (two) times daily.     Insulin  Glargine Solostar (LANTUS ) 100 UNIT/ML Solostar Pen Inject 20 Units into the skin daily.     linagliptin  (TRADJENTA ) 5 MG TABS tablet Take 1 tablet (5 mg total) by mouth daily. 90 tablet 3   midodrine  (PROAMATINE ) 10 MG tablet Take 1 tablet (10 mg total) by mouth 3 (three) times daily with meals. 90 tablet 0   Nutritional Supplements (FEEDING SUPPLEMENT, NEPRO CARB STEADY,) LIQD Take 237 mLs by mouth 2 (two) times daily between meals. 1000 mL 0   ondansetron  (ZOFRAN -ODT) 4 MG disintegrating tablet Take 1 tablet (4 mg total) by mouth every 8 (eight) hours as needed for nausea or vomiting. 20 tablet 0   potassium chloride  SA (KLOR-CON  M) 20 MEQ tablet Take 1 tablet (20 mEq total) by mouth 2 (two) times daily. 60 tablet 0   sevelamer  carbonate (RENVELA ) 2.4  g PACK Take 2.4 g by mouth 3 (three) times daily with meals.     triamcinolone  lotion (KENALOG ) 0.1 % Apply 1 Application topically 2 (two) times daily as needed (irritation).     acetaminophen  (TYLENOL  8 HOUR) 650 MG CR tablet Take 1 tablet (650 mg total) by mouth every 6 (six) hours as needed for pain or fever. 30 tablet 0   clopidogrel  (PLAVIX ) 75 MG tablet Take 1 tablet (75 mg total) by mouth daily. 30 tablet 11    Results for orders placed or performed during the hospital encounter of 03/03/24 (from the past 48 hours)  Glucose, capillary     Status: Abnormal   Collection Time: 03/03/24  5:52 AM  Result Value Ref Range   Glucose-Capillary 252 (H) 70 - 99 mg/dL    Comment: Glucose reference range applies only to samples taken after fasting for at least 8 hours.    DG Abd 1 View Result Date: 03/01/2024 CLINICAL DATA:  [id number] Constipation [id number]. EXAM: ABDOMEN - 1 VIEW  COMPARISON:  07/09/2021. FINDINGS: The bowel gas pattern is non-obstructive.  No abnormal stool burden. No evidence of pneumoperitoneum, within the limitations of a supine film. No acute osseous abnormalities. The soft tissues are within normal limits. Surgical changes, devices, tubes and lines: None. IMPRESSION: *Nonobstructive bowel gas pattern. Electronically Signed   By: Beula Brunswick M.D.   On: 03/01/2024 17:46      Blood pressure (P) 116/67, pulse (P) 67, temperature (P) 98.3 F (36.8 C), temperature source (P) Oral, resp. rate (P) 16, height (P) 5\' 4"  (1.626 m), weight (P) 74.5 kg, SpO2 (P) 95%.  General appearance: alert, cooperative, and appears stated age Head: Normocephalic, without obvious abnormality, atraumatic Neck: supple, symmetrical, trachea midline Extremities: Intact sensation and capillary refill all digits.  +epl/fpl/io.  Dry gangrene at tip of left small finger Skin: Skin color, texture, turgor normal. No rashes or lesions Neurologic: Grossly normal Incision/Wound: as above  Assessment/Plan Left small finger distal necrosis.  Plan amputation left small finger likely at distal phalanx or dip joint.  Risks, benefits, and alternatives of surgery have been discussed and the patient agrees with the plan of care.   Madilyn Cephas 03/03/2024, 8:11 AM

## 2024-03-03 NOTE — Transfer of Care (Signed)
 Immediate Anesthesia Transfer of Care Note  Patient: Melissa Howe  Procedure(s) Performed: DEBRIDEMENT, SKIN (Left: Hand)  Patient Location: PACU  Anesthesia Type:General  Level of Consciousness: awake  Airway & Oxygen Therapy: Patient Spontanous Breathing  Post-op Assessment: Report given to RN and Post -op Vital signs reviewed and stable  Post vital signs: Reviewed and stable  Last Vitals:  Vitals Value Taken Time  BP    Temp    Pulse 58 03/03/24 0910  Resp 16 03/03/24 0910  SpO2 100 % 03/03/24 0910  Vitals shown include unfiled device data.  Last Pain:  Vitals:   03/03/24 0606  TempSrc:   PainSc: 0-No pain         Complications: No notable events documented.

## 2024-03-03 NOTE — Anesthesia Postprocedure Evaluation (Signed)
 Anesthesia Post Note  Patient: Marilouise Daoust Dena  Procedure(s) Performed: DEBRIDEMENT, SKIN (Left: Hand)     Patient location during evaluation: PACU Anesthesia Type: General Level of consciousness: awake and alert Pain management: pain level controlled Vital Signs Assessment: post-procedure vital signs reviewed and stable Respiratory status: spontaneous breathing, nonlabored ventilation, respiratory function stable and patient connected to nasal cannula oxygen Cardiovascular status: blood pressure returned to baseline and stable Postop Assessment: no apparent nausea or vomiting Anesthetic complications: no  No notable events documented.  Last Vitals:  Vitals:   03/03/24 0930 03/03/24 0945  BP: (!) 107/59 (!) 110/59  Pulse: (!) 58 (!) 59  Resp: 15 12  Temp:    SpO2: 100% 99%    Last Pain:  Vitals:   03/03/24 0930  TempSrc:   PainSc: 0-No pain                 Willian Harrow

## 2024-03-03 NOTE — Discharge Instructions (Addendum)
 Post op follow up in 1 week. Office number [phone] [facility]

## 2024-03-03 NOTE — Anesthesia Procedure Notes (Signed)
 Procedure Name: LMA Insertion Date/Time: 03/03/2024 8:31 AM  Performed by: Evalee Gerard A, CRNAPre-anesthesia Checklist: Patient identified, Emergency Drugs available, Suction available and Patient being monitored Patient Re-evaluated:Patient Re-evaluated prior to induction Oxygen Delivery Method: Circle System Utilized Preoxygenation: Pre-oxygenation with 100% oxygen Induction Type: IV induction Ventilation: Mask ventilation without difficulty LMA: LMA inserted LMA Size: 4.0 Number of attempts: 1 Airway Equipment and Method: Bite block Placement Confirmation: positive ETCO2 Tube secured with: Tape Dental Injury: Teeth and Oropharynx as per pre-operative assessment

## 2024-03-04 ENCOUNTER — Encounter (HOSPITAL_COMMUNITY): Payer: Self-pay | Admitting: Orthopedic Surgery

## 2024-03-05 LAB — CULTURE, BLOOD (ROUTINE X 2)
Culture: NO GROWTH
Culture: NO GROWTH
Special Requests: ADEQUATE

## 2024-03-23 ENCOUNTER — Encounter: Payer: Self-pay | Admitting: Podiatry

## 2024-03-23 ENCOUNTER — Ambulatory Visit (INDEPENDENT_AMBULATORY_CARE_PROVIDER_SITE_OTHER): Admitting: Podiatry

## 2024-03-23 DIAGNOSIS — L97502 Non-pressure chronic ulcer of other part of unspecified foot with fat layer exposed: Secondary | ICD-10-CM | POA: Diagnosis not present

## 2024-03-23 MED ORDER — MUPIROCIN 2 % EX OINT
1.0000 | TOPICAL_OINTMENT | Freq: Two times a day (BID) | CUTANEOUS | 0 refills | Status: AC
Start: 2024-03-23 — End: 2024-04-22

## 2024-03-23 NOTE — Patient Instructions (Signed)

## 2024-03-23 NOTE — Progress Notes (Signed)
 Patient presents with complaint of an ulcer forming on the second toe of the right foot.  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  Has a history of PAD with stents in both legs.  No complaint of any fever chills nausea or vomiting.  Has only noticed clear drainage without purulence  Physical Exam:  Patient alert and oriented x 3.  No complaints of nausea, vomiting, fever, or chills  Vascular: DP pulses diminished bilaterally. PT pulses nonpalpable bilaterally.  Moderate edema. Capillary fill time immediate.  Dermatologic: Deep full-thickness ulcer penetrating the subcutaneous tissue over the dorsal medial aspect of the DIPJ of the second toe ulcer right. Measures 10 mm wide x 8 mm long x 2.5 deep.  Clear drainage.  No erythema.  Moderate exudate.  No undermining.  No bone or joint capsule exposed.  Neurologic: Paresthesias noted feet  Musculoskeletal: Hammertoe deformity second toe right.  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  Plan: -Discussed ulcer second toe right foot.  Discussed etiology and treatment.  Components of neuropathy and PAD.  Will see how the ulcer heals, and if slow healing or no improvement will need a vascular consult. -Sharp debridement of full-thickness ulceration into the subcutaneous tissue medial aspect distal inner phalangeal joint second toe right.  DebrideDevitalized tissue to a good bleeding base.  Triple antibiotic ointment and a gauze dressing. - Wound care: Soak left foot in warm lukewarm salt water Epsom salt, Bactroban ointment, and apply gauze dressing. - Rx Bactroban ointment, apply daily to wound.  Refill x 3   Return 1 f/u ulcer

## 2024-03-30 ENCOUNTER — Encounter: Payer: Self-pay | Admitting: Podiatry

## 2024-03-30 ENCOUNTER — Ambulatory Visit (INDEPENDENT_AMBULATORY_CARE_PROVIDER_SITE_OTHER): Admitting: Podiatry

## 2024-03-30 DIAGNOSIS — L97512 Non-pressure chronic ulcer of other part of right foot with fat layer exposed: Secondary | ICD-10-CM | POA: Diagnosis not present

## 2024-03-30 NOTE — Progress Notes (Signed)
 Patient presents for follow-up ulcer second toe right.  They have been doing wound care as instructed.  Still having pain at the area.  Complains of some pain on the plantar posterior aspect heel on the left  Physical Exam:  Patient alert and oriented x 3.  No complaints of nausea, vomiting, fever, or chills  Vascular: DP pulses palpable bilaterally. PT pulses nonpalpable bilaterally.  Moderate edema. Capillary fill time immediate.  Dermatologic: Deep penetrating into the subcutaneous tissue ulcer dorsal medial aspect DIPJ second toe right. Measures 9 mm wide x 8 mm long x 2 deep.  Considerable fibrous tissue.  Clear drainage.  No erythema.  Moderate exudate.  No undermining.  Tenderness on plantar posterior aspect of heel left.  Some atrophy of skin.  Some preulcerative changes.  Neurologic:   Musculoskeletal: Toes 2 through 5 right  Radiographs: None  Diagnoses: Deep ulcer penetrating the subcutaneous tissue second toe right ulcer .  Plan: -Sharp debridement full-thickness ulceration second toe right into the subcutaneous tissue.  Debrided any devitalized tissue to good bleeding base.  Triple antibiotic and a light dressing. - Wound care: Soak in lukewarm Epsom salt water twice daily, apply Bactroban  ointment, and a light dressing. - Will have to keep an eye on the heel on the left as this could be developing into an ulcer.  Recommended they wear good thick well cushioned socks and stay off her feet is much as possible.  This could become occurring as a result of altered gait due to the tenderness on the right foot    Return 2 weeks f/u ulcer

## 2024-04-01 ENCOUNTER — Other Ambulatory Visit: Payer: Self-pay | Admitting: *Deleted

## 2024-04-01 DIAGNOSIS — I739 Peripheral vascular disease, unspecified: Secondary | ICD-10-CM

## 2024-04-13 ENCOUNTER — Ambulatory Visit (INDEPENDENT_AMBULATORY_CARE_PROVIDER_SITE_OTHER): Admitting: Podiatry

## 2024-04-13 DIAGNOSIS — I739 Peripheral vascular disease, unspecified: Secondary | ICD-10-CM

## 2024-04-13 DIAGNOSIS — L97512 Non-pressure chronic ulcer of other part of right foot with fat layer exposed: Secondary | ICD-10-CM

## 2024-04-13 MED ORDER — SANTYL 250 UNIT/GM EX OINT: 1.0000 | TOPICAL_OINTMENT | Freq: Every day | CUTANEOUS | 0 refills | Status: DC

## 2024-04-13 NOTE — Progress Notes (Addendum)
 Patient presents follow-up ulcer second toe right.  Still little bit sore has not noticed any increase in soreness.  Says the drainage is about the same.  She has been doing wound care as instructed.  Physical Exam:  Patient alert and oriented x 3.  No complaints of nausea, vomiting, fever, or chills  Vascular: DP pulses 1/4 bilateral. PT pulses 0/4 lateral.  Moderate edema. Capillary fill time immediate.  Dermatologic: Deep full-thickness ulceration at the dorsal medial second toe proximal inner phalangeal joint right.   . Measures 8 mm wide x 7 mm long x 2.5 deep.  Clear drainage.  No erythema.  Moderate exudate.  No undermining.  Mix of granulation tissue and fibrous tissue.  Neurologic:   Musculoskeletal: Hammertoes 2 through 5 right.    Diagnoses: 1.  Deep full-thickness ulceration penetrating the subcutaneous tissue dorsal medial aspect second toe right 2.  PAD.Aaron Aas  Plan: -Discussed with her the ulcer.  Ulcer remains stable.  Not getting worse but not significantly improving either has a little bit more vascular appearance today.  Recommended a vascular consult.  She is already established at a vascular office.  Will get a consult there.  Will also switch her to Santyl as an ointment for the wound to try to provide a better wound bed. - Sharp debridement full-thickness ulceration and subcutaneous tissue dorsal medial aspect second toe right.  Sharply pared any devitalized tissue down to good base.  Applied triple antibiotic ointment and a light dressing. - Dispensed surgical shoe  right. -Rx Santyl applied to wound daily after soaks 1 refill  Return 2 weeks f/u ulcer

## 2024-04-18 ENCOUNTER — Telehealth: Payer: Self-pay

## 2024-04-18 NOTE — Telephone Encounter (Signed)
 PA request for Santyl 250 unit/gm submitted through CoverMyMeds and waiting on response.  Melissa Howe (Key: [id number]) Rx #: [phone]

## 2024-04-18 NOTE — Telephone Encounter (Signed)
 PA was approved. 10/28/23-09/3124

## 2024-04-20 ENCOUNTER — Ambulatory Visit

## 2024-04-20 ENCOUNTER — Encounter (HOSPITAL_COMMUNITY)

## 2024-04-27 ENCOUNTER — Ambulatory Visit: Admitting: Podiatry

## 2024-05-04 ENCOUNTER — Encounter (HOSPITAL_COMMUNITY): Payer: Self-pay

## 2024-05-09 ENCOUNTER — Ambulatory Visit (INDEPENDENT_AMBULATORY_CARE_PROVIDER_SITE_OTHER): Admitting: Podiatry

## 2024-05-09 ENCOUNTER — Encounter: Payer: Self-pay | Admitting: Podiatry

## 2024-05-09 DIAGNOSIS — L97512 Non-pressure chronic ulcer of other part of right foot with fat layer exposed: Secondary | ICD-10-CM

## 2024-05-09 NOTE — Progress Notes (Signed)
 Presents today follow-up ulceration.  She has been soaking it once a day and applying Santyl .  Wearing surgical shoe.  Physical Exam:  Patient alert and oriented x 3.  No complaints of nausea, vomiting, fever, or chills  Vascular: DP pulses 1/4 bilateral. PT pulses 0/4 lateral.  Mild to moderate edema. Capillary fill time immediate second toe right.  Dermatologic: Full-thickness ulceration penetrating the subcutaneous tissue on the dorsal medial aspect of the DIPJ of the second toe right.  Some exudate present.  Fibrous eschar base to the ulcer.. Measures 8 mm wide x 7 mm long x 2 mm deep.  No signs of infection .  Neurologic:   Musculoskeletal: Hammertoes 2 through 5 bilaterally  Diagnoses: 1.  Full-thickness ulceration second toe right penetrating the subcutaneous tissue..  Plan: Lorean debridement full-thickness ulceration second toe right.  Debriding devitalized tissue.  Applied antibiotic ointment and a light dressing. - Continue wound care soaking daily warm Epsom salt water 15 minutes, apply Santyl  ointment, and a light dressing.  Continue surgical shoe right - Has vascular consult scheduled on 7/16    Return 2 weeks f/u ulcer

## 2024-05-11 ENCOUNTER — Ambulatory Visit: Attending: Physician Assistant | Admitting: Physician Assistant

## 2024-05-11 ENCOUNTER — Ambulatory Visit (HOSPITAL_BASED_OUTPATIENT_CLINIC_OR_DEPARTMENT_OTHER)
Admission: RE | Admit: 2024-05-11 | Discharge: 2024-05-11 | Discharge status: Home / Self Care | Source: Ambulatory Visit | Attending: Physician Assistant | Admitting: Physician Assistant

## 2024-05-11 ENCOUNTER — Other Ambulatory Visit: Payer: Self-pay

## 2024-05-11 ENCOUNTER — Encounter (HOSPITAL_COMMUNITY): Payer: Self-pay

## 2024-05-11 ENCOUNTER — Ambulatory Visit (HOSPITAL_COMMUNITY)
Admission: RE | Admit: 2024-05-11 | Discharge: 2024-05-11 | Disposition: A | Discharge status: Home / Self Care | Source: Ambulatory Visit | Attending: Physician Assistant | Admitting: Physician Assistant

## 2024-05-11 VITALS — BP 124/87 | HR 81 | Temp 97.9°F | Wt 164.0 lb

## 2024-05-11 DIAGNOSIS — N186 End stage renal disease: Secondary | ICD-10-CM | POA: Diagnosis not present

## 2024-05-11 DIAGNOSIS — I7025 Atherosclerosis of native arteries of other extremities with ulceration: Secondary | ICD-10-CM

## 2024-05-11 DIAGNOSIS — I739 Peripheral vascular disease, unspecified: Secondary | ICD-10-CM | POA: Diagnosis not present

## 2024-05-11 LAB — VAS US ABI WITH/WO TBI

## 2024-05-11 NOTE — Progress Notes (Signed)
 Office Note     CC:  follow up Requesting Provider:  Claudene Prentice [name], FNP  HPI: Melissa Howe is a 73 y.o. ([birth year]) female who presents for routine follow up of PAD. Her left lower extremity ulcerations remain dry and stable. She has a new right 2nd toe ulceration that developed over past 1- 1.5 months. She is currently under management of her feet by  Dr. Christine MAUL. She was last seen on Monday 7/14 and had a debridement of her toe. Her next follow up in 2 weeks. From prior angiography in 2023 she has known single vessel AT runoff to the right foot with DP continuing pedal flow but her pedal arch noted to not be intact. However retrograde filling of the pedal arteries was present.   She has neuropathy and says she has pain from this in her left greater than right foot. Otherwise denies any pain in her legs on rest or ambulation.   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 Sheree. She currently dialyzes via PD catheter. She reports this is doing well. Recently underwent left small finger debridement of skin and nail by Dr. Murrell on 03/03/24. She has healed well since. She denies any pain, weakness or numbness in  her hands.   She is medically managed on Statin, Aspirin  and Plavix   Past Medical History:  Diagnosis Date   Anemia    CHF (congestive heart failure) (HCC)    Constipation    Coronary artery disease    Diabetes mellitus    Type II   Dyspnea    with exertion   ESRD (end stage renal disease) (HCC)    dialyzing 3 times a day with peritoneal dialysis catheter that was placed in 04/2023   History of blood transfusion    Hyperlipemia    Hypertension    Ischemic cardiomyopathy    Myocardial infarction [place])    Peripheral vascular disease (HCC)    Pneumonia 2019    Past Surgical History:  Procedure Laterality Date   ABDOMINAL AORTOGRAM W/LOWER EXTREMITY N/A 10/15/2022   Procedure:  ABDOMINAL AORTOGRAM W/LOWER EXTREMITY;  Surgeon: Lanis Fonda BRAVO, MD;  Location: [facility] INVASIVE CV LAB;  Service: Cardiovascular;  Laterality: N/A;   ABDOMINAL AORTOGRAM W/LOWER EXTREMITY N/A 11/30/2023   Procedure: ABDOMINAL AORTOGRAM W/LOWER EXTREMITY;  Surgeon: Sheree Penne Bruckner, MD;  Location: [facility] INVASIVE CV LAB;  Service: Cardiovascular;  Laterality: N/A;   APPENDECTOMY     AV FISTULA PLACEMENT Left 10/05/2019   Procedure: INSERTION OF ARTERIOVENOUS (AV) GORE-TEX VASCULAR GRAFT LEFT ARM;  Surgeon: Oris Krystal FALCON, MD;  Location: [facility] OR;  Service: Vascular;  Laterality: Left;   AV FISTULA PLACEMENT Left 02/27/2021   Procedure: LEFT ARM FIRST STAGE BASILIC VEIN  ARTERIOVENOUS (AV) FISTULA CREATION;  Surgeon: Sheree Penne Bruckner, MD;  Location: [facility] OR;  Service: Vascular;  Laterality: Left;   BASCILIC VEIN TRANSPOSITION Left 04/17/2021   Procedure: LEFT SECOND STAGE BASCILIC VEIN TRANSPOSITION;  Surgeon: Sheree Penne Bruckner, MD;  Location: [facility] OR;  Service: Vascular;  Laterality: Left;   BIOPSY  07/24/2020   Procedure: BIOPSY;  Surgeon: Legrand Victory LITTIE DOUGLAS, MD;  Location: [facility] ENDOSCOPY;  Service: Gastroenterology;;   BUBBLE STUDY  01/02/2020   Procedure: BUBBLE STUDY;  Surgeon: Loni Soyla DELENA, MD;  Location: [facility] ENDOSCOPY;  Service: Cardiology;;   CAPD INSERTION N/A 05/01/2023   Procedure: LAPAROSCOPIC INSERTION  CONTINUOUS AMBULATORY PERITONEAL DIALYSIS  (CAPD) CATHETER;  Surgeon: Sheree Penne Bruckner, MD;  Location: [facility] OR;  Service: Vascular;  Laterality: N/A;   CARDIAC CATHETERIZATION     COLONOSCOPY WITH PROPOFOL  N/A 07/24/2020   Procedure: COLONOSCOPY WITH PROPOFOL ;  Surgeon: Legrand Victory LITTIE DOUGLAS, MD;  Location: [facility] ENDOSCOPY;  Service: Gastroenterology;  Laterality: N/A;   CORONARY STENT INTERVENTION N/A 07/12/2021   Procedure: CORONARY STENT INTERVENTION;  Surgeon: Dann Candyce RAMAN, MD;  Location: [facility] INVASIVE CV LAB;  Service: Cardiovascular;  Laterality: N/A;   CORONARY  ULTRASOUND/IVUS N/A 07/12/2021   Procedure: Intravascular Ultrasound/IVUS;  Surgeon: Dann Candyce RAMAN, MD;  Location: [facility] INVASIVE CV LAB;  Service: Cardiovascular;  Laterality: N/A;   ESOPHAGOGASTRODUODENOSCOPY (EGD) WITH PROPOFOL  N/A 07/24/2020   Procedure: ESOPHAGOGASTRODUODENOSCOPY (EGD) WITH PROPOFOL ;  Surgeon: Legrand Victory LITTIE DOUGLAS, MD;  Location: [facility] ENDOSCOPY;  Service: Gastroenterology;  Laterality: N/A;   EYE SURGERY Bilateral    Cataract   FOREIGN BODY REMOVAL Left 05/13/2018   Procedure: FOREIGN BODY REMOVAL left foot;  Surgeon: Addie Cordella Hamilton, MD;  Location: [facility] OR;  Service: Orthopedics;  Laterality: Left;   I & D EXTREMITY Left 05/21/2018   Procedure: LEFT FOOT DEBRIDEMENT AND WOUND CLOSURE;  Surgeon: Harden Jerona GAILS, MD;  Location: [facility] OR;  Service: Orthopedics;  Laterality: Left;   LEFT HEART CATH AND CORONARY ANGIOGRAPHY N/A 07/12/2021   Procedure: LEFT HEART CATH AND CORONARY ANGIOGRAPHY;  Surgeon: Dann Candyce RAMAN, MD;  Location: [facility] INVASIVE CV LAB;  Service: Cardiovascular;  Laterality: N/A;   LIGATION OF ARTERIOVENOUS  FISTULA Left 01/15/2024   Procedure: LIGATION OF ARTERIOVENOUS  FISTULA;  Surgeon: Sheree Penne Bruckner, MD;  Location: [facility] OR;  Service: Vascular;  Laterality: Left;   PERIPHERAL VASCULAR BALLOON ANGIOPLASTY  11/30/2023   Procedure: PERIPHERAL VASCULAR BALLOON ANGIOPLASTY;  Surgeon: Sheree Penne Bruckner, MD;  Location: [facility] INVASIVE CV LAB;  Service: Cardiovascular;;  left AT   PERIPHERAL VASCULAR INTERVENTION  11/30/2023   Procedure: PERIPHERAL VASCULAR INTERVENTION;  Surgeon: Sheree Penne Bruckner, MD;  Location: [facility] INVASIVE CV LAB;  Service: Cardiovascular;;  stent left AT   POLYPECTOMY  07/24/2020   Procedure: POLYPECTOMY;  Surgeon: Legrand Victory LITTIE DOUGLAS, MD;  Location: [facility] ENDOSCOPY;  Service: Gastroenterology;;   SKIN DEBRIDEMENT Left 03/03/2024   Procedure: DEBRIDEMENT, SKIN;  Surgeon: Murrell Drivers, MD;  Location: [facility] OR;  Service: Orthopedics;   Laterality: Left;   TEE WITHOUT CARDIOVERSION N/A 01/02/2020   Procedure: TRANSESOPHAGEAL ECHOCARDIOGRAM (TEE);  Surgeon: Loni Soyla LABOR, MD;  Location: [facility] ENDOSCOPY;  Service: Cardiology;  Laterality: N/A;   TONSILLECTOMY     TUBAL LIGATION     UPPER EXTREMITY ANGIOGRAPHY Left 11/30/2023   Procedure: Upper Extremity Angiography;  Surgeon: Sheree Penne Bruckner, MD;  Location: [facility] INVASIVE CV LAB;  Service: Cardiovascular;  Laterality: Left;    Social History   Socioeconomic History   Marital status: Divorced    Spouse name: Not on file   Number of children: Not on file   Years of education: Not on file   Highest education level: Not on file  Occupational History   Occupation: Retired  Tobacco Use   Smoking status: Former    Current packs/day: 0.00    Types: Cigarettes    Start date: 01/19/1980    Quit date: 01/19/1984    Years since quitting: 40.3    Passive exposure: Never   Smokeless tobacco: Never  Vaping Use   Vaping status: Never Used  Substance and Sexual Activity   Alcohol   use: No   Drug use: No   Sexual activity: Not Currently    Birth control/protection: Post-menopausal  Other Topics Concern   Not on file  Social History Narrative   Lives in [place] with her dtr.  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  Recently moved from disability to retired when she turned 57.  Sedentary.  Knits frequently for fun.   Social Drivers of Corporate investment banker Strain: Not on file  Food Insecurity: Low Risk  (03/11/2024)   Received from [facility]   Hunger Vital Sign    Within the past 12 months, you worried that your food would run out before you got money to buy more: Never true    Within the past 12 months, the food you bought just didn't last and you didn't have money to get more. : Never true  Transportation Needs: No Transportation Needs (03/11/2024)   Received from [facility]    In the past 12 months, has  lack of reliable transportation kept you from medical appointments, meetings, work or from getting things needed for daily living? : No  Physical Activity: Unknown (08/06/2019)   Exercise Vital Sign    Days of Exercise per Week: Patient declined    Minutes of Exercise per Session: Patient declined  Stress: No Stress Concern Present (08/06/2019)   [employer] of Occupational Health - Occupational Stress Questionnaire    Feeling of Stress : Only a little  Social Connections: Socially Isolated (02/29/2024)   Social Connection and Isolation Panel    Frequency of Communication with Friends and Family: More than three times a week    Frequency of Social Gatherings with Friends and Family: Once a week    Attends Religious Services: Never    Database administrator or Organizations: No    Attends Banker Meetings: Never    Marital Status: Divorced  Catering manager Violence: Not At Risk (02/29/2024)   Humiliation, Afraid, Rape, and Kick questionnaire    Fear of Current or Ex-Partner: No    Emotionally Abused: No    Physically Abused: No    Sexually Abused: No    Family History  Problem Relation Age of Onset   Hypertension Mother    Diabetes Mother    Hypertension Sister     Current Outpatient Medications  Medication Sig Dispense Refill   acetaminophen  (TYLENOL  8 HOUR) 650 MG CR tablet Take 1 tablet (650 mg total) by mouth every 6 (six) hours as needed for pain or fever. 30 tablet 0   aspirin  EC 81 MG tablet Take 81 mg by mouth daily. Swallow whole.     atorvastatin  (LIPITOR ) 80 MG tablet Take 80 mg by mouth at bedtime.     calcitRIOL  (ROCALTROL ) 0.5 MCG capsule Take 0.5 mcg by mouth daily.     cinacalcet  (SENSIPAR ) 30 MG tablet Take 30 mg by mouth daily. With snack     clopidogrel  (PLAVIX ) 75 MG tablet Take 1 tablet (75 mg total) by mouth daily. 30 tablet 11   collagenase  (SANTYL ) 250 UNIT/GM ointment Apply 1 Application topically daily. 15 g 0   gabapentin  (NEURONTIN )  100 MG capsule Take 100 mg by mouth 2 (two) times daily.     Insulin  Glargine Solostar (LANTUS ) 100 UNIT/ML Solostar Pen Inject 20 Units into the skin daily.     linagliptin  (TRADJENTA ) 5 MG TABS tablet Take 1 tablet (5 mg total) by mouth daily.  90 tablet 3   Nutritional Supplements (FEEDING SUPPLEMENT, NEPRO CARB STEADY,) LIQD Take 237 mLs by mouth 2 (two) times daily between meals. 1000 mL 0   ondansetron  (ZOFRAN -ODT) 4 MG disintegrating tablet Take 1 tablet (4 mg total) by mouth every 8 (eight) hours as needed for nausea or vomiting. 20 tablet 0   potassium chloride  SA (KLOR-CON  M) 20 MEQ tablet Take 1 tablet (20 mEq total) by mouth 2 (two) times daily. 60 tablet 0   sevelamer  carbonate (RENVELA ) 2.4 g PACK Take 2.4 g by mouth 3 (three) times daily with meals.     triamcinolone  lotion (KENALOG ) 0.1 % Apply 1 Application topically 2 (two) times daily as needed (irritation).     No current facility-administered medications for this visit.    No Known Allergies   REVIEW OF SYSTEMS:  [X]  denotes positive finding, [ ]  denotes negative finding Cardiac  Comments:  Chest pain or chest pressure:    Shortness of breath upon exertion:    Short of breath when lying flat:    Irregular heart rhythm:        Vascular    Pain in calf, thigh, or hip brought on by ambulation:    Pain in feet at night that wakes you up from your sleep:     Blood clot in your veins:    Leg swelling:         Pulmonary    Oxygen at home:    Productive cough:     Wheezing:         Neurologic    Sudden weakness in arms or legs:     Sudden numbness in arms or legs:     Sudden onset of difficulty speaking or slurred speech:    Temporary loss of vision in one eye:     Problems with dizziness:         Gastrointestinal    Blood in stool:     Vomited blood:         Genitourinary    Burning when urinating:     Blood in urine:        Psychiatric    Major depression:         Hematologic    Bleeding problems:     Problems with blood clotting too easily:        Skin    Rashes or ulcers:        Constitutional    Fever or chills:      PHYSICAL EXAMINATION:  Vitals:   05/11/24 1303  BP: 124/87  Pulse: 81  Temp: 97.9 F (36.6 C)  TempSrc: Temporal  Weight: 164 lb (74.4 kg)    General:  WDWN in NAD; vital signs documented above Gait: Not observed HENT: WNL, normocephalic Pulmonary: normal non-labored breathing Cardiac: regular HR Abdomen: soft, NT, no masses Vascular Exam/Pulses: 2+ femoral pulses. 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   Non-Invasive Vascular Imaging:   +-------+----------------+-----------+----------------+------------+  ABI/TBIToday's ABI     Today's TBIPrevious ABI    Previous TBI  +-------+----------------+-----------+----------------+------------+  Right non-compressible.40        non-compressible.70           +-------+----------------+-----------+----------------+------------+  Left  non-compressible.34  non-compressible.31           +-------+----------------+-----------+----------------+------------+  Bilateral ABIs and left TBIs appear essentially unchanged compared to prior study on 01/06/2024. Right TBIs appear decreased compared to prior study on 01/06/2024.   VAS US  Lower extremity arterial duplex: Summary:  Left: No significant change as compared to previous study.  Heterogeneous plaque and medial calcifications throughout without evidence of stenosis from the CFA to the TPT. Widely patent ATA stent without evidence of stenosis.    ASSESSMENT/PLAN:: 73 y.o. female here for routine follow up of PAD. Her left lower extremity ulcerations remain  dry and stable. She has a new right 2nd toe ulceration that developed over past 1- 1.5 months. She is currently under management of her feet by  Dr. Christine DPM. She is without any rest pain or claudication. Her ABI's today are [place] but her TBI on the right dropped significantly from 0.7 to 0.4. Her right great toe pressure is 43 previously 94. I am concerned about her ability to heal these wounds. She has known single vessel AT runoff. She may not have any revascularization options however I have recommended Aortogram, arteriogram of the RLE to further evaluate her right lower extremity. - Continue statin, Aspirin  and Plavix  - she is on peritoneal dialysis so we will not need to re arrange her schedule to accommodate for OR - Will arrange Aortogram, arteriogram of BLE with possible intervention on the RLE with likely SFA/ tibial angioplasty and possible stenting with Dr. Sheree in the near future   Teretha Damme, [facility] [phone]  Clinic MD:   Sheree

## 2024-05-11 NOTE — H&P (View-Only) (Signed)
 Office Note     CC:  follow up Requesting Provider:  Claudene Prentice [name], FNP  HPI: Melissa Howe is a 74 y.o. (12-19-1950) female who presents for routine follow up of PAD. Her left lower extremity ulcerations remain dry and stable. She has a new right 2nd toe ulceration that developed over past 1- 1.5 months. She is currently under management of her feet by  Dr. Christine MAUL. She was last seen on Monday 7/14 and had a debridement of her toe. Her next follow up in 2 weeks. From prior angiography in 2023 she has known single vessel AT runoff to the right foot with DP continuing pedal flow but her pedal arch noted to not be intact. However retrograde filling of the pedal arteries was present.   She has neuropathy and says she has pain from this in her left greater than right foot. Otherwise denies any pain in her legs on rest or ambulation.   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 Sheree. She currently dialyzes via PD catheter. She reports this is doing well. Recently underwent left small finger debridement of skin and nail by Dr. Murrell on 03/03/24. She has healed well since. She denies any pain, weakness or numbness in  her hands.   She is medically managed on Statin, Aspirin  and Plavix   Past Medical History:  Diagnosis Date   Anemia    CHF (congestive heart failure) (HCC)    Constipation    Coronary artery disease    Diabetes mellitus    Type II   Dyspnea    with exertion   ESRD (end stage renal disease) (HCC)    dialyzing 3 times a day with peritoneal dialysis catheter that was placed in 04/2023   History of blood transfusion    Hyperlipemia    Hypertension    Ischemic cardiomyopathy    Myocardial infarction [facility])    Peripheral vascular disease (HCC)    Pneumonia 2019    Past Surgical History:  Procedure Laterality Date   ABDOMINAL AORTOGRAM W/LOWER EXTREMITY N/A 10/15/2022   Procedure:  ABDOMINAL AORTOGRAM W/LOWER EXTREMITY;  Surgeon: Lanis Fonda BRAVO, MD;  Location: [facility] INVASIVE CV LAB;  Service: Cardiovascular;  Laterality: N/A;   ABDOMINAL AORTOGRAM W/LOWER EXTREMITY N/A 11/30/2023   Procedure: ABDOMINAL AORTOGRAM W/LOWER EXTREMITY;  Surgeon: Sheree Penne Bruckner, MD;  Location: [facility] INVASIVE CV LAB;  Service: Cardiovascular;  Laterality: N/A;   APPENDECTOMY     AV FISTULA PLACEMENT Left 10/05/2019   Procedure: INSERTION OF ARTERIOVENOUS (AV) GORE-TEX VASCULAR GRAFT LEFT ARM;  Surgeon: Oris Krystal FALCON, MD;  Location: [facility] OR;  Service: Vascular;  Laterality: Left;   AV FISTULA PLACEMENT Left 02/27/2021   Procedure: LEFT ARM FIRST STAGE BASILIC VEIN  ARTERIOVENOUS (AV) FISTULA CREATION;  Surgeon: Sheree Penne Bruckner, MD;  Location: [facility] OR;  Service: Vascular;  Laterality: Left;   BASCILIC VEIN TRANSPOSITION Left 04/17/2021   Procedure: LEFT SECOND STAGE BASCILIC VEIN TRANSPOSITION;  Surgeon: Sheree Penne Bruckner, MD;  Location: [facility] OR;  Service: Vascular;  Laterality: Left;   BIOPSY  07/24/2020   Procedure: BIOPSY;  Surgeon: Legrand Victory LITTIE DOUGLAS, MD;  Location: [facility] ENDOSCOPY;  Service: Gastroenterology;;   BUBBLE STUDY  01/02/2020   Procedure: BUBBLE STUDY;  Surgeon: Loni Soyla DELENA, MD;  Location: [facility] ENDOSCOPY;  Service: Cardiology;;   CAPD INSERTION N/A 05/01/2023   Procedure: LAPAROSCOPIC INSERTION  CONTINUOUS AMBULATORY PERITONEAL DIALYSIS  (CAPD) CATHETER;  Surgeon: Sheree Penne Bruckner, MD;  Location: [facility] OR;  Service: Vascular;  Laterality: N/A;   CARDIAC CATHETERIZATION     COLONOSCOPY WITH PROPOFOL  N/A 07/24/2020   Procedure: COLONOSCOPY WITH PROPOFOL ;  Surgeon: Legrand Victory LITTIE DOUGLAS, MD;  Location: [facility] ENDOSCOPY;  Service: Gastroenterology;  Laterality: N/A;   CORONARY STENT INTERVENTION N/A 07/12/2021   Procedure: CORONARY STENT INTERVENTION;  Surgeon: Dann Candyce RAMAN, MD;  Location: [facility] INVASIVE CV LAB;  Service: Cardiovascular;  Laterality: N/A;   CORONARY  ULTRASOUND/IVUS N/A 07/12/2021   Procedure: Intravascular Ultrasound/IVUS;  Surgeon: Dann Candyce RAMAN, MD;  Location: [facility] INVASIVE CV LAB;  Service: Cardiovascular;  Laterality: N/A;   ESOPHAGOGASTRODUODENOSCOPY (EGD) WITH PROPOFOL  N/A 07/24/2020   Procedure: ESOPHAGOGASTRODUODENOSCOPY (EGD) WITH PROPOFOL ;  Surgeon: Legrand Victory LITTIE DOUGLAS, MD;  Location: [facility] ENDOSCOPY;  Service: Gastroenterology;  Laterality: N/A;   EYE SURGERY Bilateral    Cataract   FOREIGN BODY REMOVAL Left 05/13/2018   Procedure: FOREIGN BODY REMOVAL left foot;  Surgeon: Addie Cordella Hamilton, MD;  Location: [facility] OR;  Service: Orthopedics;  Laterality: Left;   I & D EXTREMITY Left 05/21/2018   Procedure: LEFT FOOT DEBRIDEMENT AND WOUND CLOSURE;  Surgeon: Harden Jerona GAILS, MD;  Location: [facility] OR;  Service: Orthopedics;  Laterality: Left;   LEFT HEART CATH AND CORONARY ANGIOGRAPHY N/A 07/12/2021   Procedure: LEFT HEART CATH AND CORONARY ANGIOGRAPHY;  Surgeon: Dann Candyce RAMAN, MD;  Location: [facility] INVASIVE CV LAB;  Service: Cardiovascular;  Laterality: N/A;   LIGATION OF ARTERIOVENOUS  FISTULA Left 01/15/2024   Procedure: LIGATION OF ARTERIOVENOUS  FISTULA;  Surgeon: Sheree Penne Bruckner, MD;  Location: [facility] OR;  Service: Vascular;  Laterality: Left;   PERIPHERAL VASCULAR BALLOON ANGIOPLASTY  11/30/2023   Procedure: PERIPHERAL VASCULAR BALLOON ANGIOPLASTY;  Surgeon: Sheree Penne Bruckner, MD;  Location:  [facility] INVASIVE CV LAB;  Service: Cardiovascular;;  left AT   PERIPHERAL VASCULAR INTERVENTION  11/30/2023   Procedure: PERIPHERAL VASCULAR INTERVENTION;  Surgeon: Sheree Penne Bruckner, MD;  Location: [facility] INVASIVE CV LAB;  Service: Cardiovascular;;  stent left AT   POLYPECTOMY  07/24/2020   Procedure: POLYPECTOMY;  Surgeon: Legrand Victory LITTIE DOUGLAS, MD;  Location: [facility] ENDOSCOPY;  Service: Gastroenterology;;   SKIN DEBRIDEMENT Left 03/03/2024   Procedure: DEBRIDEMENT, SKIN;  Surgeon: Murrell Drivers, MD;  Location: [facility] OR;  Service: Orthopedics;   Laterality: Left;   TEE WITHOUT CARDIOVERSION N/A 01/02/2020   Procedure: TRANSESOPHAGEAL ECHOCARDIOGRAM (TEE);  Surgeon: Loni Soyla LABOR, MD;  Location: [facility] ENDOSCOPY;  Service: Cardiology;  Laterality: N/A;   TONSILLECTOMY     TUBAL LIGATION     UPPER EXTREMITY ANGIOGRAPHY Left 11/30/2023   Procedure: Upper Extremity Angiography;  Surgeon: Sheree Penne Bruckner, MD;  Location: [facility] INVASIVE CV LAB;  Service: Cardiovascular;  Laterality: Left;    Social History   Socioeconomic History   Marital status: Divorced    Spouse name: Not on file   Number of children: Not on file   Years of education: Not on file   Highest education level: Not on file  Occupational History   Occupation: Retired  Tobacco Use   Smoking status: Former    Current packs/day: 0.00    Types: Cigarettes    Start date: 01/19/1980    Quit date: 01/19/1984    Years since quitting: 40.3    Passive exposure: Never   Smokeless tobacco: Never  Vaping Use   Vaping status: Never Used  Substance and Sexual Activity   Alcohol   use: No   Drug use: No   Sexual activity: Not Currently    Birth control/protection: Post-menopausal  Other Topics Concern   Not on file  Social History Narrative   Lives in [place] with her dtr.  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  Recently moved from disability to retired when she turned 37.  Sedentary.  Knits frequently for fun.   Social Drivers of Corporate investment banker Strain: Not on file  Food Insecurity: Low Risk  (03/11/2024)   Received from [facility]   Hunger Vital Sign    Within the past 12 months, you worried that your food would run out before you got money to buy more: Never true    Within the past 12 months, the food you bought just didn't last and you didn't have money to get more. : Never true  Transportation Needs: No Transportation Needs (03/11/2024)   Received from [facility]    In the past 12 months, has  lack of reliable transportation kept you from medical appointments, meetings, work or from getting things needed for daily living? : No  Physical Activity: Unknown (08/06/2019)   Exercise Vital Sign    Days of Exercise per Week: Patient declined    Minutes of Exercise per Session: Patient declined  Stress: No Stress Concern Present (08/06/2019)   [employer] of Occupational Health - Occupational Stress Questionnaire    Feeling of Stress : Only a little  Social Connections: Socially Isolated (02/29/2024)   Social Connection and Isolation Panel    Frequency of Communication with Friends and Family: More than three times a week    Frequency of Social Gatherings with Friends and Family: Once a week    Attends Religious Services: Never    Database administrator or Organizations: No    Attends Banker Meetings: Never    Marital Status: Divorced  Catering manager Violence: Not At Risk (02/29/2024)   Humiliation, Afraid, Rape, and Kick questionnaire    Fear of Current or Ex-Partner: No    Emotionally Abused: No    Physically Abused: No    Sexually Abused: No    Family History  Problem Relation Age of Onset   Hypertension Mother    Diabetes Mother    Hypertension Sister     Current Outpatient Medications  Medication Sig Dispense Refill   acetaminophen  (TYLENOL  8 HOUR) 650 MG CR tablet Take 1 tablet (650 mg total) by mouth every 6 (six) hours as needed for pain or fever. 30 tablet 0   aspirin  EC 81 MG tablet Take 81 mg by mouth daily. Swallow whole.     atorvastatin  (LIPITOR ) 80 MG tablet Take 80 mg by mouth at bedtime.     calcitRIOL  (ROCALTROL ) 0.5 MCG capsule Take 0.5 mcg by mouth daily.     cinacalcet  (SENSIPAR ) 30 MG tablet Take 30 mg by mouth daily. With snack     clopidogrel  (PLAVIX ) 75 MG tablet Take 1 tablet (75 mg total) by mouth daily. 30 tablet 11   collagenase  (SANTYL ) 250 UNIT/GM ointment Apply 1 Application topically daily. 15 g 0   gabapentin  (NEURONTIN )  100 MG capsule Take 100 mg by mouth 2 (two) times daily.     Insulin  Glargine Solostar (LANTUS ) 100 UNIT/ML Solostar Pen Inject 20 Units into the skin daily.     linagliptin  (TRADJENTA ) 5 MG TABS tablet Take 1 tablet (5 mg total) by mouth daily.  90 tablet 3   Nutritional Supplements (FEEDING SUPPLEMENT, NEPRO CARB STEADY,) LIQD Take 237 mLs by mouth 2 (two) times daily between meals. 1000 mL 0   ondansetron  (ZOFRAN -ODT) 4 MG disintegrating tablet Take 1 tablet (4 mg total) by mouth every 8 (eight) hours as needed for nausea or vomiting. 20 tablet 0   potassium chloride  SA (KLOR-CON  M) 20 MEQ tablet Take 1 tablet (20 mEq total) by mouth 2 (two) times daily. 60 tablet 0   sevelamer  carbonate (RENVELA ) 2.4 g PACK Take 2.4 g by mouth 3 (three) times daily with meals.     triamcinolone  lotion (KENALOG ) 0.1 % Apply 1 Application topically 2 (two) times daily as needed (irritation).     No current facility-administered medications for this visit.    No Known Allergies   REVIEW OF SYSTEMS:  [X]  denotes positive finding, [ ]  denotes negative finding Cardiac  Comments:  Chest pain or chest pressure:    Shortness of breath upon exertion:    Short of breath when lying flat:    Irregular heart rhythm:        Vascular    Pain in calf, thigh, or hip brought on by ambulation:    Pain in feet at night that wakes you up from your sleep:     Blood clot in your veins:    Leg swelling:         Pulmonary    Oxygen at home:    Productive cough:     Wheezing:         Neurologic    Sudden weakness in arms or legs:     Sudden numbness in arms or legs:     Sudden onset of difficulty speaking or slurred speech:    Temporary loss of vision in one eye:     Problems with dizziness:         Gastrointestinal    Blood in stool:     Vomited blood:         Genitourinary    Burning when urinating:     Blood in urine:        Psychiatric    Major depression:         Hematologic    Bleeding problems:     Problems with blood clotting too easily:        Skin    Rashes or ulcers:        Constitutional    Fever or chills:      PHYSICAL EXAMINATION:  Vitals:   05/11/24 1303  BP: 124/87  Pulse: 81  Temp: 97.9 F (36.6 C)  TempSrc: Temporal  Weight: 164 lb (74.4 kg)    General:  WDWN in NAD; vital signs documented above Gait: Not observed HENT: WNL, normocephalic Pulmonary: normal non-labored breathing Cardiac: regular HR Abdomen: soft, NT, no masses Vascular Exam/Pulses: 2+ femoral pulses. 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   Non-Invasive Vascular Imaging:   +-------+----------------+-----------+----------------+------------+  ABI/TBIToday's ABI     Today's TBIPrevious ABI    Previous TBI  +-------+----------------+-----------+----------------+------------+  Right non-compressible.40        non-compressible.70           +-------+----------------+-----------+----------------+------------+  Left  non-compressible.34  non-compressible.31           +-------+----------------+-----------+----------------+------------+  Bilateral ABIs and left TBIs appear essentially unchanged compared to prior study on 01/06/2024. Right TBIs appear decreased compared to prior study on 01/06/2024.   VAS US  Lower extremity arterial duplex: Summary:  Left: No significant change as compared to previous study.  Heterogeneous plaque and medial calcifications throughout without evidence of stenosis from the CFA to the TPT. Widely patent ATA stent without evidence of stenosis.    ASSESSMENT/PLAN:: 73 y.o. female here for routine follow up of PAD. Her left lower extremity ulcerations remain  dry and stable. She has a new right 2nd toe ulceration that developed over past 1- 1.5 months. She is currently under management of her feet by  Dr. Christine DPM. She is without any rest pain or claudication. Her ABI's today are [place] but her TBI on the right dropped significantly from 0.7 to 0.4. Her right great toe pressure is 43 previously 94. I am concerned about her ability to heal these wounds. She has known single vessel AT runoff. She may not have any revascularization options however I have recommended Aortogram, arteriogram of the RLE to further evaluate her right lower extremity. - Continue statin, Aspirin  and Plavix  - she is on peritoneal dialysis so we will not need to re arrange her schedule to accommodate for OR - Will arrange Aortogram, arteriogram of BLE with possible intervention on the RLE with likely SFA/ tibial angioplasty and possible stenting with Dr. Sheree in the near future   Teretha Damme, [place] [facility] [phone]  Clinic MD:   Sheree

## 2024-05-23 ENCOUNTER — Ambulatory Visit: Admitting: Podiatry

## 2024-06-01 ENCOUNTER — Ambulatory Visit (INDEPENDENT_AMBULATORY_CARE_PROVIDER_SITE_OTHER): Admitting: Podiatry

## 2024-06-01 DIAGNOSIS — L97512 Non-pressure chronic ulcer of other part of right foot with fat layer exposed: Secondary | ICD-10-CM

## 2024-06-01 NOTE — Progress Notes (Signed)
 Zentz today follow-up ulceration 2nd and 3rd toes right.  No fever chills or nausea and vomiting.  She has been doing wound care as instructed.  She is scheduled for an aortogram next week  Physical Exam:  Patient alert and oriented x 3.  No complaints of nausea, vomiting, fever, or chills  Vascular: DP pulses 1/4 bilateral. PT pulses 0/4 lateral.  Mild to moderate edema.   Dermatologic: Full-thickness ulceration penetrating the subcutaneous tissue on the dorsal medial aspect of the DIPJ of the second toe right.  Some exudate present.  Fibrous eschar base to the ulcer.. Measures 8 mm wide x 7 mm long x 2 mm deep.  No signs of infection .   Full-thickness ulceration penetrating the subcutaneous tissue on the dorsal medial aspect of the DIPJ of the second toe right.  Some exudate present.  Fibrous eschar base to the ulcer.. Measures 6 mm wide x 5 mm long x 2 mm deep.  No signs of infection .   Ulcers themselves are stable without much improvement however the surrounding tissue on the toes is much less dysvascular.  Decreased swelling in the toes  Neurologic:   Musculoskeletal: Hammertoes 2 through 5 bilaterally    Diagnoses: 1.  Full-thickness ischemic ulceration Wagner grade 2 2nd and 3rd toes right.  Plan: -Patient scheduled for an aortogram next week for evaluation of arteries -Sharp debridement full-thickness ulceration 2nd and 3rd toes right.  Sharply debrided and debrided any loose devitalized tissue.  Applied antibiotic ointment and light dressing -Continue wound care soaking foot once daily warm salt water, apply Santyl   ointment, and light dressing   Return 2 weeks f/u ulcers RT 2 and 3

## 2024-06-06 ENCOUNTER — Ambulatory Visit (HOSPITAL_COMMUNITY)
Admission: RE | Admit: 2024-06-06 | Discharge: 2024-06-06 | Disposition: A | Discharge status: Home / Self Care | Attending: Vascular Surgery | Admitting: Vascular Surgery

## 2024-06-06 ENCOUNTER — Encounter (HOSPITAL_COMMUNITY)
Admission: RE | Disposition: A | Discharge status: Home / Self Care | Payer: Self-pay | Source: Home / Self Care | Attending: Vascular Surgery

## 2024-06-06 DIAGNOSIS — Z794 Long term (current) use of insulin: Secondary | ICD-10-CM | POA: Diagnosis not present

## 2024-06-06 DIAGNOSIS — Z7982 Long term (current) use of aspirin: Secondary | ICD-10-CM | POA: Insufficient documentation

## 2024-06-06 DIAGNOSIS — E1122 Type 2 diabetes mellitus with diabetic chronic kidney disease: Secondary | ICD-10-CM | POA: Diagnosis not present

## 2024-06-06 DIAGNOSIS — I70235 Atherosclerosis of native arteries of right leg with ulceration of other part of foot: Secondary | ICD-10-CM | POA: Diagnosis not present

## 2024-06-06 DIAGNOSIS — E1140 Type 2 diabetes mellitus with diabetic neuropathy, unspecified: Secondary | ICD-10-CM | POA: Insufficient documentation

## 2024-06-06 DIAGNOSIS — I132 Hypertensive heart and chronic kidney disease with heart failure and with stage 5 chronic kidney disease, or end stage renal disease: Secondary | ICD-10-CM | POA: Insufficient documentation

## 2024-06-06 DIAGNOSIS — N186 End stage renal disease: Secondary | ICD-10-CM | POA: Diagnosis not present

## 2024-06-06 DIAGNOSIS — E1151 Type 2 diabetes mellitus with diabetic peripheral angiopathy without gangrene: Secondary | ICD-10-CM | POA: Insufficient documentation

## 2024-06-06 DIAGNOSIS — I70245 Atherosclerosis of native arteries of left leg with ulceration of other part of foot: Secondary | ICD-10-CM

## 2024-06-06 DIAGNOSIS — Z9889 Other specified postprocedural states: Secondary | ICD-10-CM

## 2024-06-06 DIAGNOSIS — Z87891 Personal history of nicotine dependence: Secondary | ICD-10-CM | POA: Insufficient documentation

## 2024-06-06 DIAGNOSIS — Z7902 Long term (current) use of antithrombotics/antiplatelets: Secondary | ICD-10-CM | POA: Diagnosis not present

## 2024-06-06 DIAGNOSIS — L97519 Non-pressure chronic ulcer of other part of right foot with unspecified severity: Secondary | ICD-10-CM

## 2024-06-06 DIAGNOSIS — L97529 Non-pressure chronic ulcer of other part of left foot with unspecified severity: Secondary | ICD-10-CM | POA: Diagnosis not present

## 2024-06-06 DIAGNOSIS — Z992 Dependence on renal dialysis: Secondary | ICD-10-CM | POA: Diagnosis not present

## 2024-06-06 DIAGNOSIS — I7025 Atherosclerosis of native arteries of other extremities with ulceration: Secondary | ICD-10-CM

## 2024-06-06 DIAGNOSIS — E11621 Type 2 diabetes mellitus with foot ulcer: Secondary | ICD-10-CM | POA: Insufficient documentation

## 2024-06-06 DIAGNOSIS — Z79899 Other long term (current) drug therapy: Secondary | ICD-10-CM | POA: Insufficient documentation

## 2024-06-06 DIAGNOSIS — I700 Atherosclerosis of aorta: Secondary | ICD-10-CM

## 2024-06-06 HISTORY — PX: ABDOMINAL AORTOGRAM: CATH118222

## 2024-06-06 HISTORY — PX: LOWER EXTREMITY ANGIOGRAPHY: CATH118251

## 2024-06-06 LAB — POCT I-STAT, CHEM 8
BUN: 38 mg/dL — ABNORMAL HIGH (ref 8–23)
Calcium, Ion: 1.09 mmol/L — ABNORMAL LOW (ref 1.15–1.40)
Chloride: 96 mmol/L — ABNORMAL LOW (ref 98–111)
Creatinine, Ser: 8.1 mg/dL — ABNORMAL HIGH (ref 0.44–1.00)
Glucose, Bld: 127 mg/dL — ABNORMAL HIGH (ref 70–99)
HCT: 32 % — ABNORMAL LOW (ref 36.0–46.0)
Hemoglobin: 10.9 g/dL — ABNORMAL LOW (ref 12.0–15.0)
Potassium: 4.8 mmol/L (ref 3.5–5.1)
Sodium: 134 mmol/L — ABNORMAL LOW (ref 135–145)
TCO2: 30 mmol/L (ref 22–32)

## 2024-06-06 LAB — GLUCOSE, CAPILLARY: Glucose-Capillary: 123 mg/dL — ABNORMAL HIGH (ref 70–99)

## 2024-06-06 SURGERY — ABDOMINAL AORTOGRAM
Anesthesia: LOCAL

## 2024-06-06 MED ORDER — ONDANSETRON HCL 4 MG/2ML IJ SOLN: 4.0000 mg | Freq: Four times a day (QID) | INTRAMUSCULAR | Status: DC | PRN

## 2024-06-06 MED ORDER — SODIUM CHLORIDE 0.9 % IV SOLN: 250.0000 mL | INTRAVENOUS | Status: DC | PRN

## 2024-06-06 MED ORDER — MIDAZOLAM HCL 2 MG/2ML IJ SOLN
INTRAMUSCULAR | Status: DC | PRN
  Administered 2024-06-06 (×2): 1 mg via INTRAVENOUS

## 2024-06-06 MED ORDER — HEPARIN (PORCINE) IN NACL 2000-0.9 UNIT/L-% IV SOLN
INTRAVENOUS | Status: DC | PRN
  Administered 2024-06-06 (×2): 1000 mL

## 2024-06-06 MED ORDER — MORPHINE SULFATE (PF) 2 MG/ML IV SOLN: 2.0000 mg | INTRAVENOUS | Status: DC | PRN

## 2024-06-06 MED ORDER — ACETAMINOPHEN 325 MG PO TABS: 650.0000 mg | ORAL_TABLET | ORAL | Status: DC | PRN

## 2024-06-06 MED ORDER — SODIUM CHLORIDE 0.9% FLUSH: 3.0000 mL | INTRAVENOUS | Status: DC | PRN

## 2024-06-06 MED ORDER — SODIUM CHLORIDE 0.9% FLUSH: 3.0000 mL | Freq: Two times a day (BID) | INTRAVENOUS | Status: DC

## 2024-06-06 MED ORDER — LIDOCAINE HCL (PF) 1 % IJ SOLN
INTRAMUSCULAR | Status: DC | PRN
  Administered 2024-06-06 (×2): 15 mL

## 2024-06-06 MED ORDER — HYDRALAZINE HCL 20 MG/ML IJ SOLN: 5.0000 mg | INTRAMUSCULAR | Status: DC | PRN

## 2024-06-06 MED ORDER — FENTANYL CITRATE (PF) 100 MCG/2ML IJ SOLN
INTRAMUSCULAR | Status: DC | PRN
  Administered 2024-06-06 (×2): 50 ug via INTRAVENOUS

## 2024-06-06 MED ORDER — SODIUM CHLORIDE 0.9 % IV SOLN: INTRAVENOUS | Status: DC

## 2024-06-06 MED ORDER — LIDOCAINE HCL (PF) 1 % IJ SOLN
INTRAMUSCULAR | Status: AC
  Filled 2024-06-06: qty 30

## 2024-06-06 MED ORDER — FENTANYL CITRATE (PF) 100 MCG/2ML IJ SOLN
INTRAMUSCULAR | Status: AC
Start: 2024-06-06 — End: 2024-06-06
  Filled 2024-06-06: qty 2

## 2024-06-06 MED ORDER — MIDAZOLAM HCL 2 MG/2ML IJ SOLN
INTRAMUSCULAR | Status: AC
  Filled 2024-06-06: qty 2

## 2024-06-06 MED ORDER — OXYCODONE HCL 5 MG PO TABS: 5.0000 mg | ORAL_TABLET | ORAL | Status: DC | PRN

## 2024-06-06 MED ORDER — LABETALOL HCL 5 MG/ML IV SOLN: 10.0000 mg | INTRAVENOUS | Status: DC | PRN

## 2024-06-06 SURGICAL SUPPLY — 8 items
CATH OMNI FLUSH 5F 65CM (CATHETERS) IMPLANT
CLOSURE MYNX CONTROL 5F (Vascular Products) IMPLANT
KIT MICROPUNCTURE NIT STIFF (SHEATH) IMPLANT
PACK CARDIAC CATHETERIZATION (CUSTOM PROCEDURE TRAY) IMPLANT
SET ATX-X65L (MISCELLANEOUS) IMPLANT
SHEATH PINNACLE 5F 10CM (SHEATH) IMPLANT
SHEATH PROBE COVER 6X72 (BAG) IMPLANT
WIRE BENTSON .035X145CM (WIRE) IMPLANT

## 2024-06-06 NOTE — Op Note (Signed)
    Patient name: Melissa Howe MRN: [id number] DOB: 11/10/1950 Sex: female  06/06/2024 Pre-operative Diagnosis: Bilateral lower extremity ulceration, end-stage renal disease Post-operative diagnosis:  Same Surgeon:  Penne BROCKS. Sheree, MD Procedure Performed: 1.  Percutaneous ultrasound-guided cannulation and Mynx device closure left common femoral artery 2.  Catheter in aorta and aortogram with bilateral lower extremity angiography 3.  Catheter selection right common femoral artery 4.  Moderate sedation with fentanyl  and Versed  for 18 minutes  Indications: 73 year old female with history of end-stage renal disease currently on dialysis via PD catheter.  She has bilateral foot ulceration she has undergone stenting of her left anterior tibial artery.  She has markedly depressed toe pressures bilaterally consistent with small vessel disease given the ulceration which is new in the right second toe she is indicated for angiography with possible intervention.  Findings: Aorta and iliac segments are calcified however there is no flow-limiting stenosis.  There is minimal flow to the bilateral renal arteries consistent with her need for dialysis.  On the right side the right lower extremity common femoral artery is patent as is the SFA however calcified there is nonflow limiting 30% stenosis of the above-knee popliteal artery.  There is single-vessel runoff via the anterior tibial artery with significant small vessel disease and minimal distal filling of the foot.  On the left side the left common femoral artery and SFA are patent however calcified.  Popliteal artery is calcified.  The stents in the left anterior tibial artery are patent the proximal tibioperoneal trunk remains patent but both PT and peroneal arteries not occluded.  Anterior tibial artery is the dominant runoff to the foot again there is significant small vessel disease.  No intervention was undertaken.  Patient will need continued wound  care at the direction of podiatry if she does not have revascularization options.   Procedure:  The patient was identified in the holding area and taken to room 8.  The patient was then placed supine on the table and prepped and draped in the usual sterile fashion.  A time out was called.  Ultrasound was used to evaluate the left common femoral artery which was noted to be patent.  The area was anesthetized ultrasound lidocaine  and cannulated with a micropuncture needle followed by wire and sheath.  Ultrasound images saved the permanent record.  Concomitantly we administered fentanyl  and Versed  with moderate sedation and her vital signs were monitored throughout the case.  A Bentson wire was placed followed by 5 French sheath and Omni catheter placed to the level of L1 and aortogram was performed followed by a aortoiliac bifurcation imaging to include the bilateral common femoral arteries.  We then crossed the bifurcation with Omni catheter and Bentson wire perform right lower extremity angiography with the above placed intervention was undertaken.  The catheter was removed over a wire.  Angiography of the left lower extremity was performed via the retrograde sheath images.  A Mynx device was then deployed.  She tolerated the procedure well.  Contrast: 80cc   Ayzia Day C. Sheree, MD [facility] [place] Office: [phone] Pager: [phone]

## 2024-06-06 NOTE — Interval H&P Note (Signed)
 History and Physical Interval Note:  06/06/2024 7:20 AM  Melissa Howe  has presented today for surgery, with the diagnosis of atherosclerosis right lower with ulcer.  The various methods of treatment have been discussed with the patient and family. After consideration of risks, benefits and other options for treatment, the patient has consented to  Procedure(s): ABDOMINAL AORTOGRAM (N/A) Lower Extremity Angiography (N/A) as a surgical intervention.  The patient's history has been reviewed, patient examined, no change in status, stable for surgery.  I have reviewed the patient's chart and labs.  Questions were answered to the patient's satisfaction.     Penne [place]

## 2024-06-07 ENCOUNTER — Encounter (HOSPITAL_COMMUNITY): Payer: Self-pay | Admitting: Vascular Surgery

## 2024-06-17 ENCOUNTER — Ambulatory Visit: Admitting: Podiatry

## 2024-06-17 DIAGNOSIS — M79671 Pain in right foot: Secondary | ICD-10-CM

## 2024-06-17 DIAGNOSIS — M79672 Pain in left foot: Secondary | ICD-10-CM | POA: Diagnosis not present

## 2024-06-17 DIAGNOSIS — L97402 Non-pressure chronic ulcer of unspecified heel and midfoot with fat layer exposed: Secondary | ICD-10-CM | POA: Diagnosis not present

## 2024-06-17 DIAGNOSIS — B351 Tinea unguium: Secondary | ICD-10-CM

## 2024-06-17 DIAGNOSIS — M79675 Pain in left toe(s): Secondary | ICD-10-CM | POA: Diagnosis not present

## 2024-06-17 DIAGNOSIS — M79674 Pain in right toe(s): Secondary | ICD-10-CM | POA: Diagnosis not present

## 2024-06-17 NOTE — Progress Notes (Signed)
 Patient also presents for evaluation and treatment of tenderness and some redness around nails feet.  Tenderness around toes with walking and wearing shoes.  Physical Exam:  Patient alert and oriented x 3.  No complaints of nausea, vomiting, fever, or chills  Vascular: DP pulses 0/4 bilateral. PT pulses 0/4 lateral. Moderate edema. Capillary fill time delayed bilaterally.  Dermatologic: Full-thickness ulceration plantar posterior aspect heel with a thick layer of eschar present.  Ulceration very tender to touch.  No signs of infection.  Moderate clear drainage.  Predebridement measurements ulcer 10 mm wide x 10 mm long x 2 deep.   Ischemic ulcerations on 2nd and 3rd toe on the right foot are stable.  Toes are somewhat dusky.  No signs of infection.  Nails thickened, disfigured, discolored 1-5 BL with subungual debris.  Redness and hypertrophic nail folds along nail folds bilaterally but no signs of drainage or infection  Neurologic: Grossly intact bilaterally  Musculoskeletal: Hammertoes 2 through 5 bilaterally  Vascular Consult:  Read report of a aortogram done several days ago.  She has runoff into the lower leg only through the anterior tibial artery.  A lot of small vessel disease in the lower extremity bilaterally.  Stents were patent in the SFA.    Diagnoses: 1.  Full-thickness Wagner grade 2 ulceration plantar plantar posterior aspect right heel.. 2. Painful onychomycotic nails 1 through 5 bilaterally. 3. Pain toes 1 through 5 bilaterally.  Plan: -Discussed with her the aortogram findings and the vascular report.  Discussed the poor circulation in the lower extremity on the right.  Judging by the report it does not sound like she has any good revascularization options.  Told her that hopefully the ulcers will heal up on their own ever if ulcers do not heal or if they develop an infection she may require digital or lower extremity amputation  -Sharp debridement full-thickness  Wagner grade 2 ulceration plantar posterior aspect right heel.  Debrided loose devitalized tissue from the ulcer.  Sharply debrided with 312 blade and a tissue nipper.  Minimal bleeding.  Debrided into the subcutaneous tissue.  Postdebridement measurements were 10 mm diameter by 3 mm deep.  Applied antibiotic ointment and dressing.  -Debridement of onychomycotic nails 1 through 5 bilaterally debrided nails and a nail nipper and reduced with a power bur.   Return 2 weeks f/u ulcer in 3 months RFC

## 2024-07-04 ENCOUNTER — Ambulatory Visit: Admitting: Podiatry

## 2024-07-06 ENCOUNTER — Encounter: Payer: Self-pay | Admitting: Podiatry

## 2024-07-06 ENCOUNTER — Ambulatory Visit (INDEPENDENT_AMBULATORY_CARE_PROVIDER_SITE_OTHER): Admitting: Podiatry

## 2024-07-06 DIAGNOSIS — L97512 Non-pressure chronic ulcer of other part of right foot with fat layer exposed: Secondary | ICD-10-CM

## 2024-07-06 DIAGNOSIS — M79672 Pain in left foot: Secondary | ICD-10-CM | POA: Diagnosis not present

## 2024-07-06 DIAGNOSIS — B351 Tinea unguium: Secondary | ICD-10-CM

## 2024-07-06 DIAGNOSIS — M79671 Pain in right foot: Secondary | ICD-10-CM

## 2024-07-06 DIAGNOSIS — L97402 Non-pressure chronic ulcer of unspecified heel and midfoot with fat layer exposed: Secondary | ICD-10-CM

## 2024-07-06 MED ORDER — SANTYL 250 UNIT/GM EX OINT: TOPICAL_OINTMENT | CUTANEOUS | 3 refills | Status: AC

## 2024-07-06 NOTE — Progress Notes (Signed)
 Subjective:  Patient ID: Melissa Howe, female    DOB: January 31, 1951,  MRN: [id number]  No chief complaint on file.   73 y.o. female presents for wound care.  Follow-up ulceration posterior plantar heel left.  Also complains of nails getting thick and sore on her feet bilaterally.  No fever chills nausea or vomiting.   Review of Systems: Negative except as noted in the HPI. Denies N/V/F/Ch.  Past Medical History:  Diagnosis Date   Anemia    CHF (congestive heart failure) (HCC)    Constipation    Coronary artery disease    Diabetes mellitus    Type II   Dyspnea    with exertion   ESRD (end stage renal disease) (HCC)    dialyzing 3 times a day with peritoneal dialysis catheter that was placed in 04/2023   History of blood transfusion    Hyperlipemia    Hypertension    Ischemic cardiomyopathy    Myocardial infarction [facility])    Peripheral vascular disease (HCC)    Pneumonia 2019    Current Outpatient Medications:    acetaminophen  (TYLENOL  8 HOUR) 650 MG CR tablet, Take 1 tablet (650 mg total) by mouth every 6 (six) hours as needed for pain or fever., Disp: 30 tablet, Rfl: 0   aspirin  EC 81 MG tablet, Take 81 mg by mouth daily. Swallow whole., Disp: , Rfl:    atorvastatin  (LIPITOR ) 80 MG tablet, Take 80 mg by mouth at bedtime., Disp: , Rfl:    calcitRIOL  (ROCALTROL ) 0.25 MCG capsule, Take 0.25 mcg by mouth daily., Disp: , Rfl:    clopidogrel  (PLAVIX ) 75 MG tablet, Take 1 tablet (75 mg total) by mouth daily., Disp: 30 tablet, Rfl: 11   collagenase  (SANTYL ) 250 UNIT/GM ointment, Apply 1 Application topically daily. (Patient not taking: Reported on 06/03/2024), Disp: 15 g, Rfl: 0   gabapentin  (NEURONTIN ) 100 MG capsule, Take 100 mg by mouth 2 (two) times daily., Disp: , Rfl:    Insulin  Glargine Solostar (LANTUS ) 100 UNIT/ML Solostar Pen, Inject 20 Units into the skin daily., Disp: , Rfl:    linagliptin  (TRADJENTA ) 5 MG TABS tablet, Take 1 tablet (5 mg total) by mouth daily., Disp: 90  tablet, Rfl: 3   metoCLOPramide  (REGLAN ) 5 MG tablet, Take 5 mg by mouth Every Tuesday,Thursday,and Saturday with dialysis., Disp: , Rfl:    midodrine  (PROAMATINE ) 10 MG tablet, Take 10 mg by mouth 3 (three) times daily., Disp: , Rfl:    ondansetron  (ZOFRAN -ODT) 4 MG disintegrating tablet, Take 1 tablet (4 mg total) by mouth every 8 (eight) hours as needed for nausea or vomiting. (Patient not taking: Reported on 06/03/2024), Disp: 20 tablet, Rfl: 0   potassium chloride  SA (KLOR-CON  M) 20 MEQ tablet, Take 1 tablet (20 mEq total) by mouth 2 (two) times daily. (Patient taking differently: Take 20 mEq by mouth daily.), Disp: 60 tablet, Rfl: 0   triamcinolone  lotion (KENALOG ) 0.1 %, Apply 1 Application topically 2 (two) times daily as needed (irritation)., Disp: , Rfl:   Social History   Tobacco Use  Smoking Status Former   Current packs/day: 0.00   Types: Cigarettes   Start date: 01/19/1980   Quit date: 01/19/1984   Years since quitting: 40.4   Passive exposure: Never  Smokeless Tobacco Never    No Known Allergies Objective:  There were no vitals filed for this visit. There is no height or weight on file to calculate BMI. Constitutional Well developed. Well nourished.  Vascular Dorsalis pedis pulses  nonpalpable bilaterally. Posterior tibial pulses nonpalpable bilaterally. Capillary refill normal to all digits.  No cyanosis or clubbing noted. Pedal hair growth normal.  Neurologic Normal speech. Oriented to person, place, and time. Protective sensation absent  Dermatologic Wound Location: L heel Wound Base: Necrotic eschar With eschar loose at medial edge of ulcer and around the periphery of the ulcer. Peri-wound: Calloused Exudate: Moderate amount Serous exudate Wound Measurements: 10 mm x 10 mm x 2mm - Thick dystrophic onychomycotic nails with discoloration and subungual debris 1 through 5 bilaterally with tenderness around around the nail borders and on the nail pressure on the nail  plate bilaterally.  Orthopedic: No pain to palpation either foot.    Assessment:  1.  Full-thickness ulcer Wagner grade 2 heel left 2.  Onychomycosis toes 1 through 5 bilaterally 3.  Pain feet bilaterally Plan:  Patient was evaluated and treated and all questions answered.  Ulcer plantar posterior aspect heel left Wagner grade 2 -Debridement as below. -Dressed with Silvadene cream, DSD. -Continue off-loading with surgical shoe. -Wound care: Soak daily lukewarm salt water for 15 minutes, apply Santyl  ointment, and a light dressing.  Procedure: Excisional Debridement of Wound Tool: Sharp #312 chisel blade/tissue nipper Type of Debridement: Sharp Excisional Frequency: @2  weeks until appropriately healed.  Dressing is to be changed daily/keeping the wound clean and dry Rationale: Removal of non-viable soft tissue from the wound to promote healing.  Anesthesia: none Pre-Debridement Wound Measurements: 1.0 cm x 1.0 cm x 0.2 cm  Post-Debridement Wound Measurements: 1.2 cm x 1.2 cm x 0.3 cm  Area devitalized tissue removed(nonviable tissue only): 1.5 cm x 1.5 cm.  Blood loss: Minimal (<50cc) Depth of Debridement: with fat layer exposed Description of tissue removed: Devitalized Tissue fibrotic tissue and some eschar.  The eschar along the medial part of the ulcer is loosening.  Healthy base of the tissue under this. Technique: The wound and the surrounding skin were prepped and draped in usual aseptic fashion.  Aseptic technique was maintained throughout the procedure.  Using #312 blade/tissue nipper sharp debridement of necrotic/nonviable tissue was performed until healthy bleeding wound bed was achieved.  No underlying bone or tendon was exposed during debridement.  The wound was thoroughly irrigated with normal saline solution Wound Progress:  Current Wound Volume: Debridement was performed of the chronic nonhealing diabetic foot wound on plantar posterior aspect heel left debridement  removed 1.2 cm x 1.2 cm of the necrotic tissue and subcutaneous tissue and none amount of purulent drainage was not present. Presence/absence of tissue: Necrotic tissue/nonviable tissue present at the base of the wound.  Sharp debridement was performed to remove the necrotic tissue/nonviable tissue back to viable tissue.  No devitalized/nonviable tissue present postdebridement.  Wound appeared clean and clear of infection No material in the wound was present that was identified to be inhibiting healing. Dressing: Dry, sterile, compression dressing. Disposition: Patient tolerated procedure well.   -Sharply debrided onychomycotic nails 1 through 5 bilaterally.  Sharply debrided nails with nail clippers and reduced with power bur.  Patient to return in 2 week for follow-up or as listed above.

## 2024-07-06 NOTE — Addendum Note (Signed)
 Addended by: GIB ANTES R on: 07/06/2024 01:53 PM   Modules accepted: Orders

## 2024-07-08 ENCOUNTER — Telehealth: Payer: Self-pay

## 2024-07-08 ENCOUNTER — Other Ambulatory Visit: Payer: Self-pay | Admitting: Endocrinology

## 2024-07-08 NOTE — Telephone Encounter (Signed)
 Patient has not been seen in over 6 months, pharmacy requested refill. Patient called to schedule appointment with office. Patient transferred to front desk, however appointment has not been placed as of yet. Will send in RX when appointment has been scheduled

## 2024-07-20 ENCOUNTER — Ambulatory Visit (INDEPENDENT_AMBULATORY_CARE_PROVIDER_SITE_OTHER): Admitting: Podiatry

## 2024-07-20 ENCOUNTER — Encounter: Payer: Self-pay | Admitting: Podiatry

## 2024-07-20 DIAGNOSIS — L97522 Non-pressure chronic ulcer of other part of left foot with fat layer exposed: Secondary | ICD-10-CM | POA: Diagnosis not present

## 2024-07-20 NOTE — Progress Notes (Signed)
 Subjective:  Patient ID: Melissa Howe, female    DOB: Mar 25, 1951,  MRN: [id number]  Chief Complaint  Patient presents with   Foot Ulcer    F/U Ulcer left heel and ulcers on toes R3, R3. Applying Santyl  ointment and wound cleaner.Pt. has Neuropathy. IDDM A1C 7.7.    73 y.o. female presents for wound care.  Follow-up ulcer plantar posterior heel left.  Still painful.   Review of Systems: Negative except as noted in the HPI. Denies N/V/F/Ch.  Past Medical History:  Diagnosis Date   Anemia    CHF (congestive heart failure) (HCC)    Constipation    Coronary artery disease    Diabetes mellitus    Type II   Dyspnea    with exertion   ESRD (end stage renal disease) (HCC)    dialyzing 3 times a day with peritoneal dialysis catheter that was placed in 04/2023   History of blood transfusion    Hyperlipemia    Hypertension    Ischemic cardiomyopathy    Myocardial infarction [facility])    Peripheral vascular disease    Pneumonia 2019    Current Outpatient Medications:    acetaminophen  (TYLENOL  8 HOUR) 650 MG CR tablet, Take 1 tablet (650 mg total) by mouth every 6 (six) hours as needed for pain or fever., Disp: 30 tablet, Rfl: 0   aspirin  EC 81 MG tablet, Take 81 mg by mouth daily. Swallow whole., Disp: , Rfl:    atorvastatin  (LIPITOR ) 80 MG tablet, Take 80 mg by mouth at bedtime., Disp: , Rfl:    calcitRIOL  (ROCALTROL ) 0.25 MCG capsule, Take 0.25 mcg by mouth daily., Disp: , Rfl:    clopidogrel  (PLAVIX ) 75 MG tablet, Take 1 tablet (75 mg total) by mouth daily., Disp: 30 tablet, Rfl: 11   collagenase  (SANTYL ) 250 UNIT/GM ointment, Apply 1-2 grams to wound qd, Disp: 15 g, Rfl: 3   gabapentin  (NEURONTIN ) 100 MG capsule, Take 100 mg by mouth 2 (two) times daily., Disp: , Rfl:    Insulin  Glargine Solostar (LANTUS ) 100 UNIT/ML Solostar Pen, Inject 20 Units into the skin daily., Disp: , Rfl:    linagliptin  (TRADJENTA ) 5 MG TABS tablet, Take 1 tablet (5 mg total) by mouth daily., Disp: 90  tablet, Rfl: 3   metoCLOPramide  (REGLAN ) 5 MG tablet, Take 5 mg by mouth Every Tuesday,Thursday,and Saturday with dialysis., Disp: , Rfl:    midodrine  (PROAMATINE ) 10 MG tablet, Take 10 mg by mouth 3 (three) times daily., Disp: , Rfl:    triamcinolone  lotion (KENALOG ) 0.1 %, Apply 1 Application topically 2 (two) times daily as needed (irritation)., Disp: , Rfl:    ondansetron  (ZOFRAN -ODT) 4 MG disintegrating tablet, Take 1 tablet (4 mg total) by mouth every 8 (eight) hours as needed for nausea or vomiting. (Patient not taking: Reported on 07/20/2024), Disp: 20 tablet, Rfl: 0   potassium chloride  SA (KLOR-CON  M) 20 MEQ tablet, Take 1 tablet (20 mEq total) by mouth 2 (two) times daily. (Patient not taking: Reported on 07/20/2024), Disp: 60 tablet, Rfl: 0  Social History   Tobacco Use  Smoking Status Former   Current packs/day: 0.00   Types: Cigarettes   Start date: 01/19/1980   Quit date: 01/19/1984   Years since quitting: 40.5   Passive exposure: Never  Smokeless Tobacco Never    No Known Allergies Objective:  There were no vitals filed for this visit. There is no height or weight on file to calculate BMI. Constitutional Well developed. Well nourished.  Vascular Dorsalis pedis pulses palpable bilaterally. Posterior tibial pulses palpable bilaterally. Capillary refill normal to all digits.  No cyanosis or clubbing noted. Pedal hair growth normal.  Neurologic Normal speech. Oriented to person, place, and time. Protective sensation absent  Dermatologic Wound Location: Plantar posterior heel left Wound Base: Necrotic eschar Peri-wound: Reddened Exudate: Moderate amount Serous exudate Wound Measurements: - 1.2 cm x 1.0 cmx 0.2 cm  Orthopedic: No pain to palpation either foot.    Assessment:  No diagnosis found. Plan:  Patient was evaluated and treated and all questions answered.  Ulcer plantar posterior aspect heel left -Debridement as below. -Dressed with Silvadene cream,  DSD. -Continue off-loading with surgical shoe.  Procedure: Excisional Debridement of Wound Tool: Sharp #312 chisel blade/tissue nipper Type of Debridement: Sharp Excisional Frequency: @Every  2 weeks until appropriately healed.  Dressing is to be changed daily/keeping the wound clean and dry Rationale: Removal of non-viable soft tissue from the wound to promote healing.  Anesthesia: none Pre-Debridement Wound Measurements: 1.2 cm x 1.0 cm x 0.2 cm  Post-Debridement Wound Measurements: 1.5 cm x 1.2 cm x 0.4 cm  Area devitalized tissue removed(nonviable tissue only): 1.5 cm x 1.2 cm.  Blood loss: Minimal (<50cc) Depth of Debridement: with fat layer exposed Description of tissue removed: Other: Loose eschar and devitalized tissues. Technique: The wound and the surrounding skin were prepped and draped in usual aseptic fashion.  Aseptic technique was maintained throughout the procedure.  Using #312 blade/tissue nipper sharp debridement of necrotic/nonviable tissue was performed until healthy bleeding wound bed was achieved.  No underlying bone or tendon was exposed during debridement.  The wound was thoroughly irrigated with normal saline solution Wound Progress:  Current Wound Volume: Debridement was performed of the chronic nonhealing diabetic foot wound on plantar posterior heel left   Debridement removed 1.5 cm x 1.2 cm of the necrotic tissue and subcutaneous tissue and none amount of purulent drainage was not present. Presence/absence of tissue: Necrotic tissue/nonviable tissue present at the base of the wound.  Sharp debridement was performed to remove the necrotic tissue/nonviable tissue back to viable tissue.  No devitalized/nonviable tissue present postdebridement.  Wound appeared clean and clear of infection No material in the wound was present that was identified to be inhibiting healing. Dressing: Dry, sterile, compression dressing. Disposition: Patient tolerated procedure well. Patient to  return in 1 week for follow-up or as listed above.

## 2024-07-27 ENCOUNTER — Other Ambulatory Visit: Payer: Self-pay | Admitting: Nurse Practitioner

## 2024-07-27 DIAGNOSIS — Z1231 Encounter for screening mammogram for malignant neoplasm of breast: Secondary | ICD-10-CM

## 2024-08-04 ENCOUNTER — Ambulatory Visit: Admitting: Podiatry

## 2024-08-23 ENCOUNTER — Other Ambulatory Visit: Payer: Self-pay

## 2024-08-23 ENCOUNTER — Ambulatory Visit (INDEPENDENT_AMBULATORY_CARE_PROVIDER_SITE_OTHER): Admitting: Podiatry

## 2024-08-23 DIAGNOSIS — I739 Peripheral vascular disease, unspecified: Secondary | ICD-10-CM

## 2024-08-23 DIAGNOSIS — L97402 Non-pressure chronic ulcer of unspecified heel and midfoot with fat layer exposed: Secondary | ICD-10-CM

## 2024-08-23 NOTE — Progress Notes (Signed)
 Subjective:  Patient ID: Melissa Howe, female    DOB: 10/11/1951,  MRN: [id number]  Chief Complaint  Patient presents with   Foot Pain    Patient is here for foot ulcer F/U right foot 2/3rd toe left foot heel     73 y.o. female presents for wound care.  Presents today follow-up ulceration heel left.  Still painful.  Has been doing wound care soaking once a day warm Epsom salt water and applying Santyl  ointment to wound with a light dressing   Review of Systems: Negative except as noted in the HPI. Denies N/V/F/Ch.  Past Medical History:  Diagnosis Date   Anemia    CHF (congestive heart failure) (HCC)    Constipation    Coronary artery disease    Diabetes mellitus    Type II   Dyspnea    with exertion   ESRD (end stage renal disease) (HCC)    dialyzing 3 times a day with peritoneal dialysis catheter that was placed in 04/2023   History of blood transfusion    Hyperlipemia    Hypertension    Ischemic cardiomyopathy    Myocardial infarction [facility])    Peripheral vascular disease    Pneumonia 2019    Current Outpatient Medications:    acetaminophen  (TYLENOL  8 HOUR) 650 MG CR tablet, Take 1 tablet (650 mg total) by mouth every 6 (six) hours as needed for pain or fever., Disp: 30 tablet, Rfl: 0   aspirin  EC 81 MG tablet, Take 81 mg by mouth daily. Swallow whole., Disp: , Rfl:    atorvastatin  (LIPITOR ) 80 MG tablet, Take 80 mg by mouth at bedtime., Disp: , Rfl:    calcitRIOL  (ROCALTROL ) 0.25 MCG capsule, Take 0.25 mcg by mouth daily., Disp: , Rfl:    clopidogrel  (PLAVIX ) 75 MG tablet, Take 1 tablet (75 mg total) by mouth daily., Disp: 30 tablet, Rfl: 11   collagenase  (SANTYL ) 250 UNIT/GM ointment, Apply 1-2 grams to wound qd, Disp: 15 g, Rfl: 3   gabapentin  (NEURONTIN ) 100 MG capsule, Take 100 mg by mouth 2 (two) times daily., Disp: , Rfl:    Insulin  Glargine Solostar (LANTUS ) 100 UNIT/ML Solostar Pen, Inject 20 Units into the skin daily., Disp: , Rfl:    linagliptin   (TRADJENTA ) 5 MG TABS tablet, Take 1 tablet (5 mg total) by mouth daily., Disp: 90 tablet, Rfl: 3   metoCLOPramide  (REGLAN ) 5 MG tablet, Take 5 mg by mouth Every Tuesday,Thursday,and Saturday with dialysis., Disp: , Rfl:    midodrine  (PROAMATINE ) 10 MG tablet, Take 10 mg by mouth 3 (three) times daily., Disp: , Rfl:    ondansetron  (ZOFRAN -ODT) 4 MG disintegrating tablet, Take 1 tablet (4 mg total) by mouth every 8 (eight) hours as needed for nausea or vomiting., Disp: 20 tablet, Rfl: 0   potassium chloride  SA (KLOR-CON  M) 20 MEQ tablet, Take 1 tablet (20 mEq total) by mouth 2 (two) times daily., Disp: 60 tablet, Rfl: 0   triamcinolone  lotion (KENALOG ) 0.1 %, Apply 1 Application topically 2 (two) times daily as needed (irritation)., Disp: , Rfl:   Social History   Tobacco Use  Smoking Status Former   Current packs/day: 0.00   Types: Cigarettes   Start date: 01/19/1980   Quit date: 01/19/1984   Years since quitting: 40.6   Passive exposure: Never  Smokeless Tobacco Never    No Known Allergies Objective:  There were no vitals filed for this visit. There is no height or weight on file to calculate BMI.  Constitutional Well developed. Well nourished.  Vascular Dorsalis pedis pulses palpable bilaterally. Posterior tibial pulses palpable bilaterally. Capillary refill normal to all digits.  No cyanosis or clubbing noted. Pedal hair growth normal.  Neurologic Normal speech. Oriented to person, place, and time. Protective sensation absent  Dermatologic Wound Location: Posterior heel left Wound Base: Fibrotic slough with eschar Peri-wound: Macerated Exudate: Moderate amount Serous exudate Wound Measurements: - Predebridement1.2 cm x 1.0 cmx 0.2 cm  Ulcers right foot toes 2 and 3 remain healed  Orthopedic: No pain to palpation either foot.    Assessment:  Full-thickness Wagner grade 2 ulceration posterior aspect heel left -somewhat larger. Plan:  Patient was evaluated and treated and  all questions answered.  Ulcer posterior aspect heel left -Debridement as below. -Dressed with Silvadene cream, DSD. -Continue off-loading with surgical shoe. -Continue soaking once a day lukewarm salt water 15 minutes, apply Santyl  ointment, and a light dressing  Procedure: Excisional Debridement of Wound Tool: Sharp #312 chisel blade/tissue nipper Type of Debridement: Sharp Excisional Frequency: @periodically  until appropriately healed.  Dressing is to be changed daily/keeping the wound clean and dry Rationale: Removal of non-viable soft tissue from the wound to promote healing.  Anesthesia: none Pre-Debridement Wound Measurements: 1.5 cm x 1.5 cm x 0.3 cm  Post-Debridement Wound Measurements: 2.0 cm x 2.3 cm x 0.4 cm  Area devitalized tissue removed(nonviable tissue only): 2.0 cm x 2.3 cm.  Blood loss: Minimal (<50cc) Depth of Debridement: with fat layer exposed Description of tissue removed: Devitalized Tissue Technique: The wound and the surrounding skin were prepped and draped in usual aseptic fashion.  Aseptic technique was maintained throughout the procedure.  Using #312 blade/tissue nipper sharp debridement of necrotic/nonviable tissue was performed until healthy bleeding wound bed was achieved.  No underlying bone or tendon was exposed during debridement.  The wound was thoroughly irrigated with normal saline solution Wound Progress:  Current Wound Volume: Debridement was performed of the chronic nonhealing diabetic foot wound on left foot posterior heel left.  Debridement removed 2.3 cm x 2.0 cm of the necrotic tissue and subcutaneous tissue and none amount of purulent drainage was not present. Presence/absence of tissue: Necrotic tissue/nonviable tissue present at the base of the wound.  Sharp debridement was performed to remove the necrotic tissue/nonviable tissue back to viable tissue.  No devitalized/nonviable tissue present postdebridement.  Wound appeared clean and clear of  infection No material in the wound was present that was identified to be inhibiting healing. Dressing: Dry, sterile, compression dressing. Disposition: Patient tolerated procedure well   Discussed with the patient the continued ulceration.  Would recommend consultation and referral to wound care clinic to try to improve healing of ulcer. Referral to wound care clinic ulcer heel left

## 2024-08-27 ENCOUNTER — Emergency Department (HOSPITAL_BASED_OUTPATIENT_CLINIC_OR_DEPARTMENT_OTHER)

## 2024-08-27 ENCOUNTER — Inpatient Hospital Stay (HOSPITAL_BASED_OUTPATIENT_CLINIC_OR_DEPARTMENT_OTHER)
Admission: EM | Admit: 2024-08-27 | Discharge: 2024-09-01 | DRG: 640 | Disposition: A | Discharge status: Hospice | Attending: Internal Medicine | Admitting: Internal Medicine

## 2024-08-27 ENCOUNTER — Encounter (HOSPITAL_BASED_OUTPATIENT_CLINIC_OR_DEPARTMENT_OTHER): Payer: Self-pay

## 2024-08-27 ENCOUNTER — Other Ambulatory Visit: Payer: Self-pay

## 2024-08-27 DIAGNOSIS — E876 Hypokalemia: Secondary | ICD-10-CM | POA: Diagnosis present

## 2024-08-27 DIAGNOSIS — E860 Dehydration: Secondary | ICD-10-CM | POA: Diagnosis present

## 2024-08-27 DIAGNOSIS — Z833 Family history of diabetes mellitus: Secondary | ICD-10-CM

## 2024-08-27 DIAGNOSIS — Z1152 Encounter for screening for COVID-19: Secondary | ICD-10-CM

## 2024-08-27 DIAGNOSIS — D631 Anemia in chronic kidney disease: Secondary | ICD-10-CM | POA: Diagnosis present

## 2024-08-27 DIAGNOSIS — G8929 Other chronic pain: Secondary | ICD-10-CM | POA: Diagnosis present

## 2024-08-27 DIAGNOSIS — Z794 Long term (current) use of insulin: Secondary | ICD-10-CM

## 2024-08-27 DIAGNOSIS — E785 Hyperlipidemia, unspecified: Secondary | ICD-10-CM | POA: Diagnosis present

## 2024-08-27 DIAGNOSIS — M67879 Other specified disorders of synovium and tendon, unspecified ankle and foot: Secondary | ICD-10-CM | POA: Diagnosis present

## 2024-08-27 DIAGNOSIS — Z992 Dependence on renal dialysis: Secondary | ICD-10-CM

## 2024-08-27 DIAGNOSIS — R6510 Systemic inflammatory response syndrome (SIRS) of non-infectious origin without acute organ dysfunction: Secondary | ICD-10-CM | POA: Diagnosis present

## 2024-08-27 DIAGNOSIS — E1122 Type 2 diabetes mellitus with diabetic chronic kidney disease: Secondary | ICD-10-CM | POA: Diagnosis present

## 2024-08-27 DIAGNOSIS — Z8249 Family history of ischemic heart disease and other diseases of the circulatory system: Secondary | ICD-10-CM

## 2024-08-27 DIAGNOSIS — R4182 Altered mental status, unspecified: Principal | ICD-10-CM

## 2024-08-27 DIAGNOSIS — Z87891 Personal history of nicotine dependence: Secondary | ICD-10-CM

## 2024-08-27 DIAGNOSIS — D72829 Elevated white blood cell count, unspecified: Secondary | ICD-10-CM | POA: Diagnosis present

## 2024-08-27 DIAGNOSIS — R197 Diarrhea, unspecified: Secondary | ICD-10-CM | POA: Diagnosis present

## 2024-08-27 DIAGNOSIS — I252 Old myocardial infarction: Secondary | ICD-10-CM

## 2024-08-27 DIAGNOSIS — Z8701 Personal history of pneumonia (recurrent): Secondary | ICD-10-CM

## 2024-08-27 DIAGNOSIS — Z66 Do not resuscitate: Secondary | ICD-10-CM | POA: Diagnosis present

## 2024-08-27 DIAGNOSIS — R188 Other ascites: Secondary | ICD-10-CM | POA: Diagnosis present

## 2024-08-27 DIAGNOSIS — I9589 Other hypotension: Secondary | ICD-10-CM | POA: Diagnosis present

## 2024-08-27 DIAGNOSIS — Z79899 Other long term (current) drug therapy: Secondary | ICD-10-CM

## 2024-08-27 DIAGNOSIS — M25551 Pain in right hip: Secondary | ICD-10-CM | POA: Diagnosis present

## 2024-08-27 DIAGNOSIS — I96 Gangrene, not elsewhere classified: Secondary | ICD-10-CM

## 2024-08-27 DIAGNOSIS — M722 Plantar fascial fibromatosis: Secondary | ICD-10-CM | POA: Diagnosis present

## 2024-08-27 DIAGNOSIS — Z515 Encounter for palliative care: Secondary | ICD-10-CM

## 2024-08-27 DIAGNOSIS — E872 Acidosis, unspecified: Secondary | ICD-10-CM | POA: Diagnosis not present

## 2024-08-27 DIAGNOSIS — I2510 Atherosclerotic heart disease of native coronary artery without angina pectoris: Secondary | ICD-10-CM | POA: Diagnosis present

## 2024-08-27 DIAGNOSIS — L89622 Pressure ulcer of left heel, stage 2: Secondary | ICD-10-CM | POA: Diagnosis present

## 2024-08-27 DIAGNOSIS — Z7902 Long term (current) use of antithrombotics/antiplatelets: Secondary | ICD-10-CM

## 2024-08-27 DIAGNOSIS — Z7984 Long term (current) use of oral hypoglycemic drugs: Secondary | ICD-10-CM

## 2024-08-27 DIAGNOSIS — N186 End stage renal disease: Secondary | ICD-10-CM | POA: Diagnosis present

## 2024-08-27 DIAGNOSIS — I120 Hypertensive chronic kidney disease with stage 5 chronic kidney disease or end stage renal disease: Secondary | ICD-10-CM | POA: Diagnosis present

## 2024-08-27 DIAGNOSIS — Z7982 Long term (current) use of aspirin: Secondary | ICD-10-CM

## 2024-08-27 DIAGNOSIS — E1152 Type 2 diabetes mellitus with diabetic peripheral angiopathy with gangrene: Secondary | ICD-10-CM | POA: Diagnosis present

## 2024-08-27 LAB — COMPREHENSIVE METABOLIC PANEL WITH GFR
ALT: 11 U/L (ref 0–44)
AST: 22 U/L (ref 15–41)
Albumin: 2.2 g/dL — ABNORMAL LOW (ref 3.5–5.0)
Alkaline Phosphatase: 229 U/L — ABNORMAL HIGH (ref 38–126)
Anion gap: 17 — ABNORMAL HIGH (ref 5–15)
BUN: 13 mg/dL (ref 8–23)
CO2: 26 mmol/L (ref 22–32)
Calcium: 9.2 mg/dL (ref 8.9–10.3)
Chloride: 90 mmol/L — ABNORMAL LOW (ref 98–111)
Creatinine, Ser: 6.31 mg/dL — ABNORMAL HIGH (ref 0.44–1.00)
GFR, Estimated: 6 mL/min — ABNORMAL LOW (ref >60)
Glucose, Bld: 253 mg/dL — ABNORMAL HIGH (ref 70–99)
Potassium: 2.3 mmol/L — CL (ref 3.5–5.1)
Sodium: 132 mmol/L — ABNORMAL LOW (ref 135–145)
Total Bilirubin: 0.5 mg/dL (ref 0.0–1.2)
Total Protein: 6.3 g/dL — ABNORMAL LOW (ref 6.5–8.1)

## 2024-08-27 LAB — I-STAT VENOUS BLOOD GAS, ED
Acid-Base Excess: 2 mmol/L (ref 0.0–2.0)
Bicarbonate: 28.1 mmol/L — ABNORMAL HIGH (ref 20.0–28.0)
Calcium, Ion: 1.14 mmol/L — ABNORMAL LOW (ref 1.15–1.40)
HCT: 43 % (ref 36.0–46.0)
Hemoglobin: 14.6 g/dL (ref 12.0–15.0)
O2 Saturation: 31 %
Potassium: 2.1 mmol/L — CL (ref 3.5–5.1)
Sodium: 131 mmol/L — ABNORMAL LOW (ref 135–145)
TCO2: 30 mmol/L (ref 22–32)
pCO2, Ven: 49.9 mmHg (ref 44–60)
pH, Ven: 7.36 (ref 7.25–7.43)
pO2, Ven: 21 mmHg — CL (ref 32–45)

## 2024-08-27 LAB — CBC WITH DIFFERENTIAL/PLATELET
Abs Immature Granulocytes: 0.12 K/uL — ABNORMAL HIGH (ref 0.00–0.07)
Basophils Absolute: 0 K/uL (ref 0.0–0.1)
Basophils Relative: 0 %
Eosinophils Absolute: 0.2 K/uL (ref 0.0–0.5)
Eosinophils Relative: 1 %
HCT: 40.8 % (ref 36.0–46.0)
Hemoglobin: 13.4 g/dL (ref 12.0–15.0)
Immature Granulocytes: 1 %
Lymphocytes Relative: 18 %
Lymphs Abs: 2.2 K/uL (ref 0.7–4.0)
MCH: 28.6 pg (ref 26.0–34.0)
MCHC: 32.8 g/dL (ref 30.0–36.0)
MCV: 87 fL (ref 80.0–100.0)
Monocytes Absolute: 0.8 K/uL (ref 0.1–1.0)
Monocytes Relative: 6 %
Neutro Abs: 9.1 K/uL — ABNORMAL HIGH (ref 1.7–7.7)
Neutrophils Relative %: 74 %
Platelets: 404 K/uL — ABNORMAL HIGH (ref 150–400)
RBC: 4.69 MIL/uL (ref 3.87–5.11)
RDW: 13.8 % (ref 11.5–15.5)
WBC: 12.4 K/uL — ABNORMAL HIGH (ref 4.0–10.5)
nRBC: 0 % (ref 0.0–0.2)

## 2024-08-27 LAB — RESP PANEL BY RT-PCR (RSV, FLU A&B, COVID)  RVPGX2
Influenza A by PCR: NEGATIVE
Influenza B by PCR: NEGATIVE
Resp Syncytial Virus by PCR: NEGATIVE
SARS Coronavirus 2 by RT PCR: NEGATIVE

## 2024-08-27 LAB — ETHANOL: Alcohol, Ethyl (B): <15 mg/dL (ref <15)

## 2024-08-27 LAB — AMMONIA: Ammonia: 13 umol/L (ref 9–35)

## 2024-08-27 LAB — LACTIC ACID, PLASMA
Lactic Acid, Venous: 5.6 mmol/L (ref 0.5–1.9)
Lactic Acid, Venous: 5.7 mmol/L (ref 0.5–1.9)

## 2024-08-27 LAB — CBG MONITORING, ED: Glucose-Capillary: 218 mg/dL — ABNORMAL HIGH (ref 70–99)

## 2024-08-27 LAB — MAGNESIUM: Magnesium: 1.2 mg/dL — ABNORMAL LOW (ref 1.7–2.4)

## 2024-08-27 MED ORDER — VANCOMYCIN HCL IN DEXTROSE 1-5 GM/200ML-% IV SOLN
1000.0000 mg | Freq: Once | INTRAVENOUS | Status: AC
  Administered 2024-08-27: 1000 mg via INTRAVENOUS
  Filled 2024-08-27: qty 200

## 2024-08-27 MED ORDER — VANCOMYCIN HCL 750 MG/150ML IV SOLN
750.0000 mg | Freq: Once | INTRAVENOUS | Status: AC
  Administered 2024-08-27: 750 mg via INTRAVENOUS
  Filled 2024-08-27: qty 150

## 2024-08-27 MED ORDER — SODIUM CHLORIDE 0.9 % IV SOLN: 2.0000 g | Freq: Once | INTRAVENOUS | Status: DC

## 2024-08-27 MED ORDER — POTASSIUM CHLORIDE 10 MEQ/100ML IV SOLN
10.0000 meq | INTRAVENOUS | Status: DC
  Administered 2024-08-27 – 2024-08-28 (×5): 10 meq via INTRAVENOUS
  Filled 2024-08-27 (×6): qty 100

## 2024-08-27 MED ORDER — ONDANSETRON HCL 4 MG/2ML IJ SOLN
4.0000 mg | Freq: Once | INTRAMUSCULAR | Status: AC
  Administered 2024-08-27: 4 mg via INTRAVENOUS
  Filled 2024-08-27: qty 2

## 2024-08-27 MED ORDER — VANCOMYCIN HCL 750 MG IV SOLR
INTRAVENOUS | Status: DC
Start: 2024-08-27 — End: 2024-08-27
  Filled 2024-08-27: qty 15

## 2024-08-27 MED ORDER — VANCOMYCIN HCL 750 MG IV SOLR
INTRAVENOUS | Status: AC
  Filled 2024-08-27: qty 15

## 2024-08-27 MED ORDER — SODIUM CHLORIDE 0.9 % IV SOLN
2.0000 g | Freq: Once | INTRAVENOUS | Status: AC
  Administered 2024-08-27: 2 g via INTRAVENOUS
  Filled 2024-08-27: qty 12.5

## 2024-08-27 MED ORDER — MAGNESIUM SULFATE 2 GM/50ML IV SOLN
2.0000 g | Freq: Once | INTRAVENOUS | Status: AC
  Administered 2024-08-28: 2 g via INTRAVENOUS
  Filled 2024-08-27: qty 50

## 2024-08-27 MED ORDER — LACTATED RINGERS IV BOLUS
1000.0000 mL | Freq: Once | INTRAVENOUS | Status: AC
  Administered 2024-08-27: 1000 mL via INTRAVENOUS

## 2024-08-27 NOTE — ED Notes (Signed)
 Unable to scan Vanc 750mg  due to recon method. Pt continues to have limited IV access due to dialysis treatment.

## 2024-08-27 NOTE — Progress Notes (Signed)
 ED Pharmacy Antibiotic Sign Off An antibiotic consult was received from an ED provider for vancomycin  per pharmacy dosing for sepsis. A chart review was completed to assess appropriateness.   The following one time order(s) were placed:  Vancomycin  1750 mg IV x 1  Further antibiotic and/or antibiotic pharmacy consults should be ordered by the admitting provider if indicated.   Thank you for allowing pharmacy to be a part of this patient's care.   Dorn Poot, [facility]  Clinical Pharmacist 08/27/24 7:45 PM

## 2024-08-27 NOTE — ED Provider Notes (Signed)
 [place] EMERGENCY DEPARTMENT AT [facility] Provider Note   CSN: [id number] Arrival date & time: 08/27/24  1757     Patient presents with: Emesis and Headache   Melissa Howe is a 73 y.o. female.    Emesis Associated symptoms: headaches   Headache Associated symptoms: vomiting      73 year old female with medical history significant for DM2, CHF, CKD, HLD, ESRD on peritoneal dialysis [name], presenting to the emergency department with nausea, vomiting, headache since October 10.  She has been having episodes of alteration in her mental status.  Family states that she is not currently at her baseline mentation.  She continues to endorse a headache, denies any neck rigidity or stiffness, no fevers.  No significant abdominal pain.  No significant purulence from the peritoneal dialysate noted by family.  Her most recent episode of change in mental status occurred today.  No seizure-like activity, no focal neurologic deficits, no facial droop, focal numbness or weakness.  She has had significant malaise and fatigue.  Family also notes that she had had intermittent episodes over the past few weeks where she would stare off and not respond to their verbal questioning, no seizure-like activity.  Prior to Admission medications   Medication Sig Start Date End Date Taking? Authorizing Provider  acetaminophen  (TYLENOL  8 HOUR) 650 MG CR tablet Take 1 tablet (650 mg total) by mouth every 6 (six) hours as needed for pain or fever. 01/04/24   Charlyn Sora, MD  aspirin  EC 81 MG tablet Take 81 mg by mouth daily. Swallow whole.    [provider]  atorvastatin  (LIPITOR ) 80 MG tablet Take 80 mg by mouth at bedtime.    [provider]  calcitRIOL  (ROCALTROL ) 0.25 MCG capsule Take 0.25 mcg by mouth daily.    [provider]  clopidogrel  (PLAVIX ) 75 MG tablet Take 1 tablet (75 mg total) by mouth daily. 01/06/24 01/05/25  Rhyne, Samantha J, PA-C  collagenase  (SANTYL ) 250  UNIT/GM ointment Apply 1-2 grams to wound qd 07/06/24   Christine Rush, DPM  gabapentin  (NEURONTIN ) 100 MG capsule Take 100 mg by mouth 2 (two) times daily. 11/02/23   [provider]  Insulin  Glargine Solostar (LANTUS ) 100 UNIT/ML Solostar Pen Inject 20 Units into the skin daily. 11/11/22   [provider]  linagliptin  (TRADJENTA ) 5 MG TABS tablet Take 1 tablet (5 mg total) by mouth daily. 06/26/23   Thapa, Sudan, MD  metoCLOPramide  (REGLAN ) 5 MG tablet Take 5 mg by mouth Every Tuesday,Thursday,and Saturday with dialysis.    [provider]  midodrine  (PROAMATINE ) 10 MG tablet Take 10 mg by mouth 3 (three) times daily. 05/09/24   [provider]  ondansetron  (ZOFRAN -ODT) 4 MG disintegrating tablet Take 1 tablet (4 mg total) by mouth every 8 (eight) hours as needed for nausea or vomiting. 02/12/24   Alba Sharper, MD  potassium chloride  SA (KLOR-CON  M) 20 MEQ tablet Take 1 tablet (20 mEq total) by mouth 2 (two) times daily. 03/02/24   Cleotilde Perkins, DO  triamcinolone  lotion (KENALOG ) 0.1 % Apply 1 Application topically 2 (two) times daily as needed (irritation).    [provider]    Allergies: Patient has no known allergies.    Review of Systems  Gastrointestinal:  Positive for vomiting.  Neurological:  Positive for headaches.  All other systems reviewed and are negative.   Updated Vital Signs BP 113/73 (BP Location: Left Arm)   Pulse 95   Temp 97.9 F (36.6 C) (Oral)  Resp 18   Ht 5' 2.5 (1.588 m)   Wt 77.1 kg   SpO2 97%   BMI 30.60 kg/m   Physical Exam Vitals and nursing note reviewed.  Constitutional:      General: She is not in acute distress.    Comments: GCS 14, AAOx3  HENT:     Head: Normocephalic and atraumatic.  Eyes:     Conjunctiva/sclera: Conjunctivae normal.     Pupils: Pupils are equal, round, and reactive to light.  Neck:     Comments: Range of motion of the neck intact, no meningismus Cardiovascular:     Rate and  Rhythm: Normal rate and regular rhythm.  Pulmonary:     Effort: Pulmonary effort is normal. No respiratory distress.  Abdominal:     General: There is no distension.     Tenderness: There is no guarding.     Comments: Peritoneal dialysis catheter in place, does not appear to be draining purulence, abdomen is soft and with very mild tenderness, nondistended, no rebound or guarding  Musculoskeletal:        General: No deformity or signs of injury.     Cervical back: Neck supple.  Skin:    Findings: No lesion or rash.  Neurological:     General: No focal deficit present.     Mental Status: She is alert. Mental status is at baseline.     Cranial Nerves: No cranial nerve deficit.     Sensory: No sensory deficit.     Motor: No weakness.     (all labs ordered are listed, but only abnormal results are displayed) Labs Reviewed  COMPREHENSIVE METABOLIC PANEL WITH GFR - Abnormal; Notable for the following components:      Result Value   Sodium 132 (*)    Potassium 2.3 (*)    Chloride 90 (*)    Glucose, Bld 253 (*)    Creatinine, Ser 6.31 (*)    Total Protein 6.3 (*)    Albumin  2.2 (*)    Alkaline Phosphatase 229 (*)    GFR, Estimated 6 (*)    Anion gap 17 (*)    All other components within normal limits  CBC WITH DIFFERENTIAL/PLATELET - Abnormal; Notable for the following components:   WBC 12.4 (*)    Platelets 404 (*)    Neutro Abs 9.1 (*)    Abs Immature Granulocytes 0.12 (*)    All other components within normal limits  LACTIC ACID, PLASMA - Abnormal; Notable for the following components:   Lactic Acid, Venous 5.6 (*)    All other components within normal limits  LACTIC ACID, PLASMA - Abnormal; Notable for the following components:   Lactic Acid, Venous 5.7 (*)    All other components within normal limits  MAGNESIUM  - Abnormal; Notable for the following components:   Magnesium  1.2 (*)    All other components within normal limits  CBG MONITORING, ED - Abnormal; Notable for  the following components:   Glucose-Capillary 218 (*)    All other components within normal limits  I-STAT VENOUS BLOOD GAS, ED - Abnormal; Notable for the following components:   pO2, Ven 21 (*)    Bicarbonate 28.1 (*)    Sodium 131 (*)    Potassium 2.1 (*)    Calcium , Ion 1.14 (*)    All other components within normal limits  BODY FLUID CULTURE W GRAM STAIN  RESP PANEL BY RT-PCR (RSV, FLU A&B, COVID)  RVPGX2  CULTURE, BLOOD (ROUTINE X 2)  CULTURE, BLOOD (ROUTINE X 2)  ETHANOL  AMMONIA  BLOOD GAS, VENOUS  BODY FLUID CELL COUNT WITH DIFFERENTIAL  ALBUMIN , PLEURAL OR PERITONEAL FLUID   GLUCOSE, PLEURAL OR PERITONEAL FLUID  LACTATE DEHYDROGENASE, PLEURAL OR PERITONEAL FLUID  PROTEIN, PLEURAL OR PERITONEAL FLUID  LACTIC ACID, PLASMA  LACTIC ACID, PLASMA  CBG MONITORING, ED    EKG: EKG Interpretation Date/Time:  Saturday August 27 2024 18:16:33 EDT Ventricular Rate:  100 PR Interval:  132 QRS Duration:  113 QT Interval:  365 QTC Calculation: 434 R Axis:   26  Text Interpretation: Sinus tachycardia Multiple premature complexes, vent & supraven Borderline intraventricular conduction delay Nonspecific T abnormalities, lateral leads Confirmed by Jerrol Agent ([id number]) on 08/27/2024 9:41:38 PM  Radiology: CT ABDOMEN PELVIS WO CONTRAST Result Date: 08/27/2024 EXAM: CT ABDOMEN AND PELVIS WITHOUT CONTRAST 08/27/2024 07:36:00 PM TECHNIQUE: CT of the abdomen and pelvis was performed without the administration of intravenous contrast. Multiplanar reformatted images are provided for review. Automated exposure control, iterative reconstruction, and/or weight-based adjustment of the mA/kV was utilized to reduce the radiation dose to as low as reasonably achievable. COMPARISON: None available. CLINICAL HISTORY: Abdominal pain, acute, nonlocalized. FINDINGS: LOWER CHEST: No acute abnormality. LIVER: The liver is unremarkable. GALLBLADDER AND BILE DUCTS: Gallbladder is unremarkable. No biliary  ductal dilatation. SPLEEN: No acute abnormality. PANCREAS: No acute abnormality. ADRENAL GLANDS: No acute abnormality. KIDNEYS, URETERS AND BLADDER: No stones in the kidneys or ureters. No hydronephrosis. No perinephric or periureteral stranding. Urinary bladder is unremarkable. GI AND BOWEL: Stomach demonstrates no acute abnormality. There is no bowel obstruction. No visible abnormal bowel or inflammation. PERITONEUM AND RETROPERITONEUM: Moderate to large volume ascites throughout the abdomen and pelvis. Moderate pneumoperitoneum. Exact source is not clearly identified. This conceivably could be related to peritoneal dialysis. Peritoneal dialysis catheter in the pelvis. VASCULATURE: Aorta is normal in caliber. Diffuse aortoiliac and branch vessel atherosclerosis. LYMPH NODES: No lymphadenopathy. REPRODUCTIVE ORGANS: No acute abnormality. BONES AND SOFT TISSUES: No acute osseous abnormality. No focal soft tissue abnormality. IMPRESSION: 1. Moderate pneumoperitoneum with moderate to large ascites; source not identified. Consider peritoneal dialysis as a potential cause. Recommend clinical correlation to exclude bowel gas. No abnormal bowel visualized. 2. Peritoneal dialysis catheter present in the pelvis. 3. Extensive aortoiliac and branch vessel atherosclerosis. Electronically signed by: Franky Crease MD 08/27/2024 07:50 PM EDT RP Workstation: [id number]   DG Chest Port 1 View Result Date: 08/27/2024 CLINICAL DATA:  Altered mental status. EXAM: PORTABLE CHEST 1 VIEW COMPARISON:  Feb 29, 2024 FINDINGS: The heart size and mediastinal contours are within normal limits. Low lung volumes are noted. No acute infiltrate, pleural effusion or pneumothorax is identified. Multilevel degenerative changes seen throughout the thoracic spine. IMPRESSION: Low lung volumes without acute or active cardiopulmonary disease. Electronically Signed   By: Suzen Dials M.D.   On: 08/27/2024 19:38   CT HEAD WO CONTRAST Result Date:  08/27/2024 CLINICAL DATA:  Altered mental status. EXAM: CT HEAD WITHOUT CONTRAST TECHNIQUE: Contiguous axial images were obtained from the base of the skull through the vertex without intravenous contrast. RADIATION DOSE REDUCTION: This exam was performed according to the departmental dose-optimization program which includes automated exposure control, adjustment of the mA and/or kV according to patient size and/or use of iterative reconstruction technique. COMPARISON:  February 22, 2024 FINDINGS: Brain: There is generalized cerebral atrophy with widening of the extra-axial spaces and ventricular dilatation. There are areas of decreased attenuation within the white matter tracts of the supratentorial brain, consistent with microvascular disease  changes. Vascular: No hyperdense vessel or unexpected calcification. Skull: Normal. Negative for fracture or focal lesion. Sinuses/Orbits: Postoperative changes are seen involving the upper lobe. Other: None. IMPRESSION: 1. Generalized cerebral atrophy and microvascular disease changes of the supratentorial brain. 2. No acute intracranial abnormality. Electronically Signed   By: Suzen Dials M.D.   On: 08/27/2024 19:36     Procedures   Medications Ordered in the ED  vancomycin  (VANCOCIN ) 750 MG injection (  Not Given 08/27/24 2129)  potassium chloride  10 mEq in 100 mL IVPB (10 mEq Intravenous New Bag/Given 08/27/24 2248)  magnesium  sulfate IVPB 2 g 50 mL (has no administration in time range)  lactated ringers  bolus 1,000 mL (0 mLs Intravenous Stopped 08/27/24 2100)  vancomycin  (VANCOCIN ) IVPB 1000 mg/200 mL premix (0 mg Intravenous Stopped 08/27/24 2112)    Followed by  vancomycin  LUCRECIA) IVPB 750 mg/150 mL (0 mg Intravenous Stopped 08/27/24 2131)  ceFEPIme  (MAXIPIME ) 2 g in sodium chloride  0.9 % 100 mL IVPB (0 g Intravenous Stopped 08/27/24 2214)  ondansetron  (ZOFRAN ) injection 4 mg (4 mg Intravenous Given 08/27/24 2145)                                     Medical Decision Making Amount and/or Complexity of Data Reviewed Labs: ordered. Radiology: ordered.  Risk Prescription drug management. Decision regarding hospitalization.    73 year old female with medical history significant for DM2, CHF, CKD, HLD, ESRD on peritoneal dialysis [name], presenting to the emergency department with nausea, vomiting, headache since October 10.  She has been having episodes of alteration in her mental status.  Family states that she is not currently at her baseline mentation.  She continues to endorse a headache, denies any neck rigidity or stiffness, no fevers.  No significant abdominal pain.  No significant purulence from the peritoneal dialysate noted by family.  Her most recent episode of change in mental status occurred today.  No seizure-like activity, no focal neurologic deficits, no facial droop, focal numbness or weakness.  She has had significant malaise and fatigue. Family also notes that she had had intermittent episodes over the past few weeks where she would stare off and not respond to their verbal questioning, no seizure-like activity.  On arrival, the patient was afebrile, not tachycardic or tachypneic, BP 141/76, saturating 100% on room air.  Rate controlled atrial fibrillation noted on cardiac telemetry.  Patient presenting to the emergency department with nausea, vomiting, headaches, intermittent, intermittent alteration in her mental status.  Considered infection and peritoneal dialysis fluid although patient does not appear overtly peritonitic on exam.  Considered other toxic metabolic derangement, other infectious complication.  Patient without focal neurologic deficits to suggest acute stroke.  Patient without meningismus on exam, low concern for meningitis.  Headache was not sudden onset or maximal in onset, low concern for subarachnoid hemorrhage.   Broad-based infectious workup initiated given patient fatigue, malaise, nausea, change in mental  status.  Medical Decision Making:   Melissa Howe is a 73 y.o. female who presented to the ED today with altered mental status detailed above.    Patient placed on continuous vitals and telemetry monitoring while in ED which was reviewed periodically.  Complete initial physical exam performed, notably the patient  was mild tender in the abdomen, neuro intact, AAOx3, GCS14.    Reviewed and confirmed nursing documentation for past medical history, family history, social history.    Initial  Assessment:   With the patient's presentation of altered mental status, most likely diagnosis is delerium 2/2 infectious etiology (UTI/CAP/URI) vs metabolic abnormality (Na/K/Mg/Ca) vs nonspecific etiology. Other diagnoses were considered including (but not limited to) CVA, ICH, intracranial mass, critical dehydration, heptatic dysfunction, uremia, hypercarbia, intoxication, endrocrine abnormality, toxidrome. These are considered less likely due to history of present illness and physical exam findings.   This is most consistent with an acute life/limb threatening illness complicated by underlying chronic conditions.  Infectious workup initiated and the patient was covered with antibiotics to include vancomycin  and cefepime .  Informed by nursing and pharmacy that ceftazidime was not available at this department.  The patient was administered a 1 L LR bolus.  Initial Plan:  CTH to evaluate for intracranial etiology of patient's symptoms  CT Abd Pel WO Screening labs including CBC and Metabolic panel to evaluate for infectious or metabolic etiology of disease.  Urinalysis with reflex culture ordered to evaluate for UTI or relevant urologic/nephrologic pathology.  CXR to evaluate for structural/infectious intrathoracic pathology.  TSH for evaluation for endrocrine etiology Drug screen for toxidrome evaluation VBG for acid/base status and further toxidrome evaulation EKG to evaluate for cardiac  pathology Objective evaluation as below reviewed   Initial Study Results:   Laboratory  All laboratory results reviewed without evidence of clinically relevant pathology.   Exceptions include: Lactic acidosis noted to greater than 5 x 2, hypomagnesemia to 1.2, hypokalemia to 2.3, hyperglycemia to 253 without an anion gap acidosis with a bicarbonate of 26, anion gap noted to be mildly elevated at 17, CBC with a nonspecific leukocytosis 12.4, no anemia, creatinine elevated to 6.31, BUN normal, LFTs normal.  VBG without an alkalosis or acidosis, peritoneal body fluid without WBCs seen, no organisms seen.  COVID flu and RSV PCR testing negative.  Ethanol level normal, ammonia level normal.  Urinalysis pending.  EKG EKG was reviewed independently. Rate, rhythm, axis, intervals all examined and without medically relevant abnormality. ST segments without concerns for elevations.    Radiology:  All images reviewed independently. Agree with radiology report at this time.   CT ABDOMEN PELVIS WO CONTRAST Result Date: 08/27/2024 EXAM: CT ABDOMEN AND PELVIS WITHOUT CONTRAST 08/27/2024 07:36:00 PM TECHNIQUE: CT of the abdomen and pelvis was performed without the administration of intravenous contrast. Multiplanar reformatted images are provided for review. Automated exposure control, iterative reconstruction, and/or weight-based adjustment of the mA/kV was utilized to reduce the radiation dose to as low as reasonably achievable. COMPARISON: None available. CLINICAL HISTORY: Abdominal pain, acute, nonlocalized. FINDINGS: LOWER CHEST: No acute abnormality. LIVER: The liver is unremarkable. GALLBLADDER AND BILE DUCTS: Gallbladder is unremarkable. No biliary ductal dilatation. SPLEEN: No acute abnormality. PANCREAS: No acute abnormality. ADRENAL GLANDS: No acute abnormality. KIDNEYS, URETERS AND BLADDER: No stones in the kidneys or ureters. No hydronephrosis. No perinephric or periureteral stranding. Urinary bladder is  unremarkable. GI AND BOWEL: Stomach demonstrates no acute abnormality. There is no bowel obstruction. No visible abnormal bowel or inflammation. PERITONEUM AND RETROPERITONEUM: Moderate to large volume ascites throughout the abdomen and pelvis. Moderate pneumoperitoneum. Exact source is not clearly identified. This conceivably could be related to peritoneal dialysis. Peritoneal dialysis catheter in the pelvis. VASCULATURE: Aorta is normal in caliber. Diffuse aortoiliac and branch vessel atherosclerosis. LYMPH NODES: No lymphadenopathy. REPRODUCTIVE ORGANS: No acute abnormality. BONES AND SOFT TISSUES: No acute osseous abnormality. No focal soft tissue abnormality. IMPRESSION: 1. Moderate pneumoperitoneum with moderate to large ascites; source not identified. Consider peritoneal dialysis as a potential cause. Recommend  clinical correlation to exclude bowel gas. No abnormal bowel visualized. 2. Peritoneal dialysis catheter present in the pelvis. 3. Extensive aortoiliac and branch vessel atherosclerosis. Electronically signed by: Franky Crease MD 08/27/2024 07:50 PM EDT RP Workstation: [id number]   DG Chest Port 1 View Result Date: 08/27/2024 CLINICAL DATA:  Altered mental status. EXAM: PORTABLE CHEST 1 VIEW COMPARISON:  Feb 29, 2024 FINDINGS: The heart size and mediastinal contours are within normal limits. Low lung volumes are noted. No acute infiltrate, pleural effusion or pneumothorax is identified. Multilevel degenerative changes seen throughout the thoracic spine. IMPRESSION: Low lung volumes without acute or active cardiopulmonary disease. Electronically Signed   By: Suzen Dials M.D.   On: 08/27/2024 19:38   CT HEAD WO CONTRAST Result Date: 08/27/2024 CLINICAL DATA:  Altered mental status. EXAM: CT HEAD WITHOUT CONTRAST TECHNIQUE: Contiguous axial images were obtained from the base of the skull through the vertex without intravenous contrast. RADIATION DOSE REDUCTION: This exam was performed according  to the departmental dose-optimization program which includes automated exposure control, adjustment of the mA and/or kV according to patient size and/or use of iterative reconstruction technique. COMPARISON:  February 22, 2024 FINDINGS: Brain: There is generalized cerebral atrophy with widening of the extra-axial spaces and ventricular dilatation. There are areas of decreased attenuation within the white matter tracts of the supratentorial brain, consistent with microvascular disease changes. Vascular: No hyperdense vessel or unexpected calcification. Skull: Normal. Negative for fracture or focal lesion. Sinuses/Orbits: Postoperative changes are seen involving the upper lobe. Other: None. IMPRESSION: 1. Generalized cerebral atrophy and microvascular disease changes of the supratentorial brain. 2. No acute intracranial abnormality. Electronically Signed   By: Suzen Dials M.D.   On: 08/27/2024 19:36       Final Assessment and Plan:   Patient with pneumoperitoneum however in the setting of peritoneal dialysis this is not unexpected.  No significant abdominal tenderness on repeat evaluation, lactic acidosis noted however very low concern for perforated viscus at this time.  Urinalysis pending, patient needs admission for electrolyte replenishment with hypomagnesemia to 1.2 and hypokalemia to 2.3, continued observation in the setting of her presentation.  Feeling symptomatically improved on repeat assessment, denies headache, no meningismus, overall well-appearing, does borderline meet SIRS criteria with a leukocytosis 12.4 and rate controlled atrial fibrillation in the 90s, medicine consulted for admission for observation.      Final diagnoses:  Altered mental status, unspecified altered mental status type  Hypomagnesemia  Hypokalemia  SIRS (systemic inflammatory response syndrome) [facility])    ED Discharge Orders     None          Jerrol Agent, MD 08/27/24 2354

## 2024-08-27 NOTE — ED Notes (Signed)
 MAG and Potassium IV admin delayed to limited IV access.

## 2024-08-27 NOTE — ED Triage Notes (Addendum)
 In for intermittent nausea, vomiting, headache since 08/05/2024. Today she is having episodes of alerted mental status. CBG 218 at triage.  This episode started today at approx 1630 today with AMS episode in the lobby.

## 2024-08-27 NOTE — ED Notes (Signed)
 VBG results given to Dr. Jerrol.

## 2024-08-27 NOTE — ED Notes (Addendum)
 CRITICAL VALUE STICKER  CRITICAL VALUE:lactic 5.7  RECEIVER (on-site recipient of call):Tangala Wiegert RN  DATE & TIME NOTIFIED: 9:47pm 08/27/24  MESSENGER (representative from lab): Jazmine  MD NOTIFIED: lawsing  TIME OF NOTIFICATION:9:48pm  RESPONSE:  see chart

## 2024-08-28 ENCOUNTER — Observation Stay (HOSPITAL_COMMUNITY)

## 2024-08-28 DIAGNOSIS — E876 Hypokalemia: Secondary | ICD-10-CM

## 2024-08-28 LAB — PROTEIN, PLEURAL OR PERITONEAL FLUID: Total protein, fluid: <3 g/dL

## 2024-08-28 LAB — ALBUMIN, PLEURAL OR PERITONEAL FLUID: Albumin, Fluid: <1.5 g/dL

## 2024-08-28 LAB — HEMOGLOBIN A1C
Hgb A1c MFr Bld: 5.7 % — ABNORMAL HIGH (ref 4.8–5.6)
Mean Plasma Glucose: 116.89 mg/dL

## 2024-08-28 LAB — BODY FLUID CELL COUNT WITH DIFFERENTIAL: Total Nucleated Cell Count, Fluid: 4 uL (ref 0–1000)

## 2024-08-28 LAB — BASIC METABOLIC PANEL WITH GFR
Anion gap: 15 (ref 5–15)
BUN: 13 mg/dL (ref 8–23)
CO2: 23 mmol/L (ref 22–32)
Calcium: 8.3 mg/dL — ABNORMAL LOW (ref 8.9–10.3)
Chloride: 95 mmol/L — ABNORMAL LOW (ref 98–111)
Creatinine, Ser: 6.21 mg/dL — ABNORMAL HIGH (ref 0.44–1.00)
GFR, Estimated: 7 mL/min — ABNORMAL LOW (ref >60)
Glucose, Bld: 167 mg/dL — ABNORMAL HIGH (ref 70–99)
Potassium: 2.9 mmol/L — ABNORMAL LOW (ref 3.5–5.1)
Sodium: 133 mmol/L — ABNORMAL LOW (ref 135–145)

## 2024-08-28 LAB — CBG MONITORING, ED: Glucose-Capillary: 175 mg/dL — ABNORMAL HIGH (ref 70–99)

## 2024-08-28 LAB — GLUCOSE, CAPILLARY
Glucose-Capillary: 163 mg/dL — ABNORMAL HIGH (ref 70–99)
Glucose-Capillary: 92 mg/dL (ref 70–99)

## 2024-08-28 LAB — LACTATE DEHYDROGENASE, PLEURAL OR PERITONEAL FLUID: LD, Fluid: <25 U/L — ABNORMAL HIGH (ref 3–23)

## 2024-08-28 LAB — LACTIC ACID, PLASMA
Lactic Acid, Venous: 5 mmol/L (ref 0.5–1.9)
Lactic Acid, Venous: 4.8 mmol/L (ref 0.5–1.9)
Lactic Acid, Venous: 4.7 mmol/L (ref 0.5–1.9)

## 2024-08-28 LAB — MAGNESIUM: Magnesium: 1.3 mg/dL — ABNORMAL LOW (ref 1.7–2.4)

## 2024-08-28 LAB — GLUCOSE, PLEURAL OR PERITONEAL FLUID: Glucose, Fluid: >1200 mg/dL

## 2024-08-28 MED ORDER — DELFLEX-LC/1.5% DEXTROSE 344 MOSM/L IP SOLN: INTRAPERITONEAL | Status: DC

## 2024-08-28 MED ORDER — CALCITRIOL 0.25 MCG PO CAPS
0.2500 ug | ORAL_CAPSULE | Freq: Every day | ORAL | Status: DC
  Administered 2024-08-28 – 2024-08-30 (×3): 0.25 ug via ORAL
  Filled 2024-08-28 (×5): qty 1

## 2024-08-28 MED ORDER — POTASSIUM CHLORIDE CRYS ER 20 MEQ PO TBCR
20.0000 meq | EXTENDED_RELEASE_TABLET | Freq: Two times a day (BID) | ORAL | Status: DC
  Administered 2024-08-28 – 2024-08-29 (×3): 20 meq via ORAL
  Filled 2024-08-28 (×3): qty 1

## 2024-08-28 MED ORDER — MIDODRINE HCL 5 MG PO TABS
10.0000 mg | ORAL_TABLET | Freq: Three times a day (TID) | ORAL | Status: DC
Start: 2024-08-28 — End: 2024-08-31
  Administered 2024-08-28 – 2024-08-31 (×6): 10 mg via ORAL
  Filled 2024-08-28 (×10): qty 2

## 2024-08-28 MED ORDER — INSULIN ASPART 100 UNIT/ML IJ SOLN
0.0000 [IU] | Freq: Three times a day (TID) | INTRAMUSCULAR | Status: DC
  Administered 2024-08-28: 1 [IU] via SUBCUTANEOUS
  Administered 2024-08-29: 2 [IU] via SUBCUTANEOUS
  Administered 2024-08-29: 1 [IU] via SUBCUTANEOUS
  Filled 2024-08-28: qty 3
  Filled 2024-08-28: qty 1
  Filled 2024-08-28: qty 2

## 2024-08-28 MED ORDER — ACETAMINOPHEN 650 MG RE SUPP: 650.0000 mg | Freq: Four times a day (QID) | RECTAL | Status: DC | PRN

## 2024-08-28 MED ORDER — CLOPIDOGREL BISULFATE 75 MG PO TABS
75.0000 mg | ORAL_TABLET | Freq: Every day | ORAL | Status: DC
Start: 2024-08-28 — End: 2024-08-31
  Administered 2024-08-28 – 2024-08-30 (×3): 75 mg via ORAL
  Filled 2024-08-28 (×5): qty 1

## 2024-08-28 MED ORDER — ATORVASTATIN CALCIUM 80 MG PO TABS
80.0000 mg | ORAL_TABLET | Freq: Every day | ORAL | Status: DC
  Administered 2024-08-29 – 2024-08-31 (×2): 80 mg via ORAL
  Filled 2024-08-28 (×3): qty 1

## 2024-08-28 MED ORDER — ONDANSETRON HCL 4 MG/2ML IJ SOLN
4.0000 mg | Freq: Once | INTRAMUSCULAR | Status: AC
  Administered 2024-08-28: 4 mg via INTRAVENOUS
  Filled 2024-08-28: qty 2

## 2024-08-28 MED ORDER — POTASSIUM CHLORIDE CRYS ER 20 MEQ PO TBCR
40.0000 meq | EXTENDED_RELEASE_TABLET | Freq: Once | ORAL | Status: AC
  Administered 2024-08-28: 40 meq via ORAL
  Filled 2024-08-28: qty 2

## 2024-08-28 MED ORDER — COLLAGENASE 250 UNIT/GM EX OINT
TOPICAL_OINTMENT | Freq: Every day | CUTANEOUS | Status: DC
  Filled 2024-08-28: qty 30

## 2024-08-28 MED ORDER — HEPARIN SODIUM (PORCINE) 5000 UNIT/ML IJ SOLN
5000.0000 [IU] | Freq: Three times a day (TID) | INTRAMUSCULAR | Status: DC
  Administered 2024-08-28 – 2024-08-31 (×9): 5000 [IU] via SUBCUTANEOUS
  Filled 2024-08-28 (×9): qty 1

## 2024-08-28 MED ORDER — GENTAMICIN SULFATE 0.1 % EX CREA
1.0000 | TOPICAL_CREAM | Freq: Every day | CUTANEOUS | Status: DC
  Administered 2024-08-29 – 2024-08-30 (×4): 1 via TOPICAL
  Filled 2024-08-28: qty 15

## 2024-08-28 MED ORDER — PROCHLORPERAZINE EDISYLATE 10 MG/2ML IJ SOLN
10.0000 mg | Freq: Four times a day (QID) | INTRAMUSCULAR | Status: DC | PRN
  Administered 2024-08-28 – 2024-08-30 (×4): 10 mg via INTRAVENOUS
  Filled 2024-08-28 (×4): qty 2

## 2024-08-28 MED ORDER — ONDANSETRON HCL 4 MG/2ML IJ SOLN
4.0000 mg | Freq: Four times a day (QID) | INTRAMUSCULAR | Status: DC | PRN
  Administered 2024-08-29 – 2024-08-30 (×3): 4 mg via INTRAVENOUS
  Filled 2024-08-28 (×3): qty 2

## 2024-08-28 MED ORDER — POTASSIUM CHLORIDE 10 MEQ/100ML IV SOLN
10.0000 meq | INTRAVENOUS | Status: AC
  Administered 2024-08-28 – 2024-08-29 (×4): 10 meq via INTRAVENOUS
  Filled 2024-08-28: qty 100

## 2024-08-28 MED ORDER — ACETAMINOPHEN 325 MG PO TABS
650.0000 mg | ORAL_TABLET | Freq: Four times a day (QID) | ORAL | Status: DC | PRN
  Administered 2024-08-30 – 2024-08-31 (×2): 650 mg via ORAL
  Filled 2024-08-28 (×2): qty 2

## 2024-08-28 MED ORDER — INSULIN ASPART 100 UNIT/ML IJ SOLN
0.0000 [IU] | Freq: Every day | INTRAMUSCULAR | Status: DC
  Administered 2024-08-29: 2 [IU] via SUBCUTANEOUS
  Filled 2024-08-28 (×2): qty 2

## 2024-08-28 NOTE — Plan of Care (Signed)

## 2024-08-28 NOTE — Consult Note (Signed)
 Renal Service Consult Note [facility] Melissa JONETTA Fret, MD  Patient: Melissa Howe Date: 08/28/2024 Requesting Physician: Dr. Juvenal  Reason for Consult: ESRD pt w/  HPI: The patient is a 73 y.o. year-old w/ PMH of DM, ESRD on PD,HTN, PAD w/ chronic foot wounds presented 11/01 last night c/o nausea/ vomiting and HA for 3 wks. Then started have AMS prior to coming to ED. No f/c/s. In ED BP 110/76, HR 93, RR 20, temp 97.6, 100% sats on RA. Labs showed K+ 2.3, BUN 13, creat 6.31, CA 9.2, alb 2.2, Mg 1.2, NH3 13, tbili 0.5. LA 5.6, 5.7, 4.8. WBC 12K, Hb 13. Abd CT showed moderate pneumoperitoneum with moderate to large ascites. Consider peritoneal dialysis as potential cause of air and liquid. Pt was vanc/ cefepime  IV abx. Her foot showed gangrenous toe changes and a heel wound. Pt rec'd K+ supplementation. GI pathogen panel was ordered and Cdif. Clear liquid was ordered. Pt was admitted. We are asked to see for ESRD.   Pt seen in room. No c/o's at this time. States that they were in an Urgent Care place for about 24 hrs prior to being sent to ED Sat evening. While at the Urgent Care her daughter brought some PD fluid and did an exchange at the Urgent Care. There are PD fluids in the labs for cell count and culture, they are labeled 11/01 and they were collected by the daughter and the staff at the Urgent Care apparently as far as I can tell from the pt's history.    ROS - denies CP, no joint pain, no HA, no blurry vision, no rash   Past Medical History  Past Medical History:  Diagnosis Date   Anemia    CHF (congestive heart failure) (HCC)    Constipation    Coronary artery disease    Diabetes mellitus    Type II   Dyspnea    with exertion   ESRD (end stage renal disease) (HCC)    dialyzing 3 times a day with peritoneal dialysis catheter that was placed in 04/2023   History of blood transfusion    Hyperlipemia    Hypertension    Ischemic cardiomyopathy    Myocardial  infarction [facility])    Peripheral vascular disease    Pneumonia 2019   Past Surgical History  Past Surgical History:  Procedure Laterality Date   ABDOMINAL AORTOGRAM N/A 06/06/2024   Procedure: ABDOMINAL AORTOGRAM;  Surgeon: Sheree Penne Bruckner, MD;  Location: [facility] INVASIVE CV LAB;  Service: Cardiovascular;  Laterality: N/A;   ABDOMINAL AORTOGRAM W/LOWER EXTREMITY N/A 10/15/2022   Procedure: ABDOMINAL AORTOGRAM W/LOWER EXTREMITY;  Surgeon: Lanis Fonda BRAVO, MD;  Location: [facility] INVASIVE CV LAB;  Service: Cardiovascular;  Laterality: N/A;   ABDOMINAL AORTOGRAM W/LOWER EXTREMITY N/A 11/30/2023   Procedure: ABDOMINAL AORTOGRAM W/LOWER EXTREMITY;  Surgeon: Sheree Penne Bruckner, MD;  Location: [facility] INVASIVE CV LAB;  Service: Cardiovascular;  Laterality: N/A;   APPENDECTOMY     AV FISTULA PLACEMENT Left 10/05/2019   Procedure: INSERTION OF ARTERIOVENOUS (AV) GORE-TEX VASCULAR GRAFT LEFT ARM;  Surgeon: Oris Krystal FALCON, MD;  Location: [facility] OR;  Service: Vascular;  Laterality: Left;   AV FISTULA PLACEMENT Left 02/27/2021   Procedure: LEFT ARM FIRST STAGE BASILIC VEIN  ARTERIOVENOUS (AV) FISTULA CREATION;  Surgeon: Sheree Penne Bruckner, MD;  Location: [facility] OR;  Service: Vascular;  Laterality: Left;   BASCILIC VEIN TRANSPOSITION Left 04/17/2021   Procedure: LEFT SECOND STAGE BASCILIC VEIN TRANSPOSITION;  Surgeon: Sheree,  Penne Bruckner, MD;  Location: [facility] OR;  Service: Vascular;  Laterality: Left;   BIOPSY  07/24/2020   Procedure: BIOPSY;  Surgeon: Legrand Victory LITTIE DOUGLAS, MD;  Location: [facility] ENDOSCOPY;  Service: Gastroenterology;;   BUBBLE STUDY  01/02/2020   Procedure: BUBBLE STUDY;  Surgeon: Loni Soyla LABOR, MD;  Location: [facility] ENDOSCOPY;  Service: Cardiology;;   CAPD INSERTION N/A 05/01/2023   Procedure: LAPAROSCOPIC INSERTION CONTINUOUS AMBULATORY PERITONEAL DIALYSIS  (CAPD) CATHETER;  Surgeon: Sheree Penne Bruckner, MD;  Location: [facility] OR;  Service: Vascular;  Laterality: N/A;   CARDIAC CATHETERIZATION      COLONOSCOPY WITH PROPOFOL  N/A 07/24/2020   Procedure: COLONOSCOPY WITH PROPOFOL ;  Surgeon: Legrand Victory LITTIE DOUGLAS, MD;  Location: [facility] ENDOSCOPY;  Service: Gastroenterology;  Laterality: N/A;   CORONARY STENT INTERVENTION N/A 07/12/2021   Procedure: CORONARY STENT INTERVENTION;  Surgeon: Dann Candyce RAMAN, MD;  Location: [facility] INVASIVE CV LAB;  Service: Cardiovascular;  Laterality: N/A;   CORONARY ULTRASOUND/IVUS N/A 07/12/2021   Procedure: Intravascular Ultrasound/IVUS;  Surgeon: Dann Candyce RAMAN, MD;  Location: [facility] INVASIVE CV LAB;  Service: Cardiovascular;  Laterality: N/A;   ESOPHAGOGASTRODUODENOSCOPY (EGD) WITH PROPOFOL  N/A 07/24/2020   Procedure: ESOPHAGOGASTRODUODENOSCOPY (EGD) WITH PROPOFOL ;  Surgeon: Legrand Victory LITTIE DOUGLAS, MD;  Location: [facility] ENDOSCOPY;  Service: Gastroenterology;  Laterality: N/A;   EYE SURGERY Bilateral    Cataract   FOREIGN BODY REMOVAL Left 05/13/2018   Procedure: FOREIGN BODY REMOVAL left foot;  Surgeon: Addie Cordella Hamilton, MD;  Location: [facility] OR;  Service: Orthopedics;  Laterality: Left;   I & D EXTREMITY Left 05/21/2018   Procedure: LEFT FOOT DEBRIDEMENT AND WOUND CLOSURE;  Surgeon: Harden Jerona GAILS, MD;  Location: [facility] OR;  Service: Orthopedics;  Laterality: Left;   LEFT HEART CATH AND CORONARY ANGIOGRAPHY N/A 07/12/2021   Procedure: LEFT HEART CATH AND CORONARY ANGIOGRAPHY;  Surgeon: Dann Candyce RAMAN, MD;  Location: [facility] INVASIVE CV LAB;  Service: Cardiovascular;  Laterality: N/A;   LIGATION OF ARTERIOVENOUS  FISTULA Left 01/15/2024   Procedure: LIGATION OF ARTERIOVENOUS  FISTULA;  Surgeon: Sheree Penne Bruckner, MD;  Location: [facility] OR;  Service: Vascular;  Laterality: Left;   LOWER EXTREMITY ANGIOGRAPHY N/A 06/06/2024   Procedure: Lower Extremity Angiography;  Surgeon: Sheree Penne Bruckner, MD;  Location: [facility] INVASIVE CV LAB;  Service: Cardiovascular;  Laterality: N/A;   PERIPHERAL VASCULAR BALLOON ANGIOPLASTY  11/30/2023   Procedure: PERIPHERAL VASCULAR BALLOON  ANGIOPLASTY;  Surgeon: Sheree Penne Bruckner, MD;  Location: [facility] INVASIVE CV LAB;  Service: Cardiovascular;;  left AT   PERIPHERAL VASCULAR INTERVENTION  11/30/2023   Procedure: PERIPHERAL VASCULAR INTERVENTION;  Surgeon: Sheree Penne Bruckner, MD;  Location: [facility] INVASIVE CV LAB;  Service: Cardiovascular;;  stent left AT   POLYPECTOMY  07/24/2020   Procedure: POLYPECTOMY;  Surgeon: Legrand Victory LITTIE DOUGLAS, MD;  Location: [facility] ENDOSCOPY;  Service: Gastroenterology;;   SKIN DEBRIDEMENT Left 03/03/2024   Procedure: DEBRIDEMENT, SKIN;  Surgeon: Murrell Drivers, MD;  Location: [facility] OR;  Service: Orthopedics;  Laterality: Left;   TEE WITHOUT CARDIOVERSION N/A 01/02/2020   Procedure: TRANSESOPHAGEAL ECHOCARDIOGRAM (TEE);  Surgeon: Loni Soyla LABOR, MD;  Location: [facility] ENDOSCOPY;  Service: Cardiology;  Laterality: N/A;   TONSILLECTOMY     TUBAL LIGATION     UPPER EXTREMITY ANGIOGRAPHY Left 11/30/2023   Procedure: Upper Extremity Angiography;  Surgeon: Sheree Penne Bruckner, MD;  Location: [facility] INVASIVE CV LAB;  Service: Cardiovascular;  Laterality: Left;   Family History  Family History  Problem Relation Age of Onset   Hypertension Mother  Diabetes Mother    Hypertension Sister    Social History  reports that she quit smoking about 40 years ago. Her smoking use included cigarettes. She started smoking about 44 years ago. She has never been exposed to tobacco smoke. She has never used smokeless tobacco. She reports that she does not drink alcohol  and does not use drugs. Allergies No Known Allergies Home medications Prior to Admission medications   Medication Sig Start Date End Date Taking? Authorizing Provider  acetaminophen  (TYLENOL  8 HOUR) 650 MG CR tablet Take 1 tablet (650 mg total) by mouth every 6 (six) hours as needed for pain or fever. 01/04/24   Charlyn Sora, MD  aspirin  EC 81 MG tablet Take 81 mg by mouth daily. Swallow whole.    [provider]  atorvastatin  (LIPITOR ) 80 MG tablet  Take 80 mg by mouth at bedtime.    [provider]  calcitRIOL  (ROCALTROL ) 0.25 MCG capsule Take 0.25 mcg by mouth daily.    [provider]  clopidogrel  (PLAVIX ) 75 MG tablet Take 1 tablet (75 mg total) by mouth daily. 01/06/24 01/05/25  [place] Lucie PARAS, PA-C  collagenase  (SANTYL ) 250 UNIT/GM ointment Apply 1-2 grams to wound qd 07/06/24   Christine Rush, DPM  gabapentin  (NEURONTIN ) 100 MG capsule Take 100 mg by mouth 2 (two) times daily. 11/02/23   [provider]  Insulin  Glargine Solostar (LANTUS ) 100 UNIT/ML Solostar Pen Inject 20 Units into the skin daily. 11/11/22   [provider]  linagliptin  (TRADJENTA ) 5 MG TABS tablet Take 1 tablet (5 mg total) by mouth daily. 06/26/23   Thapa, Sudan, MD  metoCLOPramide  (REGLAN ) 5 MG tablet Take 5 mg by mouth Every Tuesday,Thursday,and Saturday with dialysis.    [provider]  midodrine  (PROAMATINE ) 10 MG tablet Take 10 mg by mouth 3 (three) times daily. 05/09/24   [provider]  ondansetron  (ZOFRAN -ODT) 4 MG disintegrating tablet Take 1 tablet (4 mg total) by mouth every 8 (eight) hours as needed for nausea or vomiting. 02/12/24   Alba Sharper, MD  potassium chloride  SA (KLOR-CON  M) 20 MEQ tablet Take 1 tablet (20 mEq total) by mouth 2 (two) times daily. 03/02/24   Cleotilde Perkins, DO  triamcinolone  lotion (KENALOG ) 0.1 % Apply 1 Application topically 2 (two) times daily as needed (irritation).    [provider]     Vitals:   08/28/24 0935 08/28/24 0938 08/28/24 1130 08/28/24 1449  BP: (!) 86/61  100/60 105/64  Pulse: 82  82   Resp: 18  20 14   Temp:  98 F (36.7 C)    TempSrc:  Axillary    SpO2: 95%  95%   Weight:      Height:       Exam Gen alert, no distress Sclera anicteric, throat clear  No jvd or bruits Chest clear bilat to bases RRR no MRG Abd soft ntnd no mass or ascites +bs, PD cath intact  Ext no LE or UE edema, no other edema L heel wound bandaged, R foot toe  darkening Neuro is alert, Ox 3 , nf      Home bp meds: Midodrine  10mg  tid Klor-con  20 meq bid Others: zoran, tradjenta , insulin  glargine, neurontin  100bid, lipitor , asa, rocaltrol  0.25 mcg daily   OP PD: [facility] CAPD 7 days / wk 77kg   dwell vol 3000cc  3 exchanges/ day  8hr dwell time Last Hb 12.1 on 10/8 Last pth 177, Ca 9.3  PD cell ct (11/01): TNC = 4 (<  50-100 = no infection) PD fluid culture (11/01): no growth < 24 hrs Labs 11/01- Na 132, K 2.3, BUN 13, creat 6.3, alb 2.2, LFT's ok                      WBC 12K,  Hb 13,  LA 5.6 - 5.7 - 4.8 Covid/ flu/ RSV negative   Assessment/ Plan: N/V/D: causing hypokalemia, per pmd. No signs of peritonitis (no abd pain, non-tender exam, TNC = 4 from PD fluid sent yesterday).  ^Lactic acid: cx's sent, getting IV cefepime  and IV vancomycin .  ESRD: on PD. Last full PD was likely 11/30 or 11/31. The daughter did one exchange while they were waiting at the Urgent Care on Saturday prior to admission. Will try to get PD done tonight; note she is on CAPD at home (3 exchanges each 8 hrs, has fluid in around the clock); here we will do 5 exchanges of 2 hrs each, overnight, same fill volume.  Hypokalemia/ hypomagnesemia: primary team looking for GI infection to treat, supplementing KCl IV and po as needed, and IV magnesium  sulfate as well.  Volume: looks euvolemic on exam, could be a bit dry. Had 1 liter LR bolus yesterday. Using all 1.5% fluids w/ hx n/v/d.  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 Follow.        Myer Fret  MD [facility] 08/28/2024, 4:01 PM  Recent Labs  Lab 08/27/24 1853 08/27/24 1908  HGB 13.4 14.6  ALBUMIN  2.2*  --   CALCIUM  9.2  --   CREATININE 6.31*  --   K 2.3* 2.1*   Inpatient medications:  atorvastatin   80 mg Oral QHS   calcitRIOL   0.25 mcg Oral Daily   clopidogrel   75 mg Oral Daily   collagenase    Topical Daily   heparin   5,000 Units Subcutaneous Q8H   insulin   aspart  0-5 Units Subcutaneous QHS   insulin  aspart  0-6 Units Subcutaneous TID WC   midodrine   10 mg Oral TID   potassium chloride  SA  20 mEq Oral BID    acetaminophen  **OR** acetaminophen , ondansetron  (ZOFRAN ) IV

## 2024-08-28 NOTE — Progress Notes (Addendum)
 Emesis x 1 immediately after med administration. States she is too nauseated to attempt to take anything else PO. PO atorvastatin  and midodrine  held (BP 121/87). IV team consulted for additional IV access as pt only has #24 PIV. Dr. Marcene notified and orders placed for IV K+. IV team at bedside.

## 2024-08-28 NOTE — ED Notes (Signed)
 Called Kim at [facility] for transport

## 2024-08-28 NOTE — ED Notes (Signed)
 Bed side report with Melissa Howe. Introduced to patient and family. Patient stable at this time. Alert and oriented, family still at bedside.

## 2024-08-28 NOTE — Progress Notes (Signed)
 [facility] night cross cover note:   I was notified by the patient's RN that this patient experienced an episode of emesis shortly after taking her oral potassium chloride  supplement, noting that most recent potassium level was 2.9 around 1500 today.  Patient conveys that she continues to feel nauseous, and does not feel that she is able to tolerate additional oral potassium supplementation at this time.  Additionally, patient's RN conveys that the patient's peripheral IV access is currently limited to a 24-gauge piv. RN has consulted the IV team in an effort to expand peripheral IV access. I  subsequently ordered Kcl 40 meq iv over 4 hours, and added prn iv Compazine for refractory nausea/vomiting, in addition to existing order for as needed IV Zofran .     Eva Pore, DO Hospitalist

## 2024-08-28 NOTE — Consult Note (Signed)
 WOC Nurse Consult Note: patient followed by podiatry for diabetic ulcer L heel and ulcerations 2nd and 3rd digits R foot; last seen 10/28 and using Santyl  to L heel; no specific wound care for R toes  Reason for Consult:  foot wounds  Wound type: 1.  Full thickness L heel diabetic ulcer 100% dry eschar  2.  Partial thickness R 2nd and 3rd digits dark necrotic appearing, dry  Pressure Injury POA: NA not pressure  Measurement: see nursing flowsheet  Drainage:  appears dry  Periwound: Dressing procedure/placement/frequency:  Cleanse L heel with soap and water, dry and Apply 1/4 thick layer of Santyl  to wound bed, top with saline moist gauze, dry gauze and secure with Kerlix roll gauze.  Paint R foot 2nd and 3rd toes with Betadine  2 times daily and allow to air dry.  May leave open to air or cover with dry gauze or tape whichever is preferred.   Patient is followed by vascular surgery as well with continued poor blood flow noted to R foot.  Patient has been referred to wound care center by podiatry for ongoing management of foot wounds.   POC discussed with bedside nurse. WOC team will not follow. Re-consult if further needs arise.   Thank you,    Powell Bar MSN, RN-BC, [employer]

## 2024-08-28 NOTE — ED Notes (Signed)
 PT family caregivers to bedside to complete peritoneal dialysis per PT request. Family caregivers routinely do the same at home. Pt remains a/o x 4 VSS NAD PT denies pain at this time. Pt on room air.

## 2024-08-28 NOTE — ED Notes (Signed)
 Bernardino, RN attempted to administer Zofran  IV. Unable to flush existing 20 long in right AC. I attempted to flush as well without success. Removed line and dressed. Attempted to ultrasound another IV, she has no other sites that I feel comfortable with accessing. Left arm has fistula. Placed 24 G saline lock in right hand. Family at bedside. Ryan, RN aware and in to administer medication.

## 2024-08-28 NOTE — H&P (Addendum)
 History and Physical    Melissa Howe FMW:[id number] DOB: January 23, 1951 DOA: 08/27/2024  I have briefly reviewed the patient's prior medical records in [facility]  PCP: Claudene Prentice [name], FNP  [facility]  Chief Complaint: Nausea, vomiting, diarrhea  HPI: Melissa Howe is a 73 y.o. female with medical history significant of end-stage renal disease on peritoneal dialysis, diabetes, hypertension, peripheral vascular disease with chronic foot wounds.  She comes in to the [facility] complaining of recurrent nausea, vomiting, diarrhea over the last 2 days.  Her vomitus was nonbloody, nonbilious.  Patient denies fevers chills, abdominal pain. Patient has chronic wounds on her feet and she is followed by podiatry for this -Last seen on 10/28 with diagnosis of Full-thickness Wagner grade 2 ulceration posterior aspect heel left -somewhat larger.  At that time she was debrided, dressed with Silvadene cream and given a surgical shoe.  I also referred her to wound care clinic  Patient also follows with Dr. Sheree for peripheral vascular disease.  She had a angiogram on 8/11.  The findings of Aorta and iliac segments are calcified however there is no flow-limiting stenosis.    In the ER she was given antibiotics including Vanco and cefepime  for possible SBP.  CT head was done without abnormalities.  CT abdomen pelvis also done showing ascites, laboratory data was abnormal in regards to potassium, magnesium  and lactic acid  Review of Systems: As per HPI otherwise 10 point review of systems negative.   Past Medical History:  Diagnosis Date   Anemia    CHF (congestive heart failure) (HCC)    Constipation    Coronary artery disease    Diabetes mellitus    Type II   Dyspnea    with exertion   ESRD (end stage renal disease) (HCC)    dialyzing 3 times a day with peritoneal dialysis catheter that was placed in 04/2023   History of blood transfusion    Hyperlipemia     Hypertension    Ischemic cardiomyopathy    Myocardial infarction [facility])    Peripheral vascular disease    Pneumonia 2019    Past Surgical History:  Procedure Laterality Date   ABDOMINAL AORTOGRAM N/A 06/06/2024   Procedure: ABDOMINAL AORTOGRAM;  Surgeon: Sheree Penne Bruckner, MD;  Location: [facility] INVASIVE CV LAB;  Service: Cardiovascular;  Laterality: N/A;   ABDOMINAL AORTOGRAM W/LOWER EXTREMITY N/A 10/15/2022   Procedure: ABDOMINAL AORTOGRAM W/LOWER EXTREMITY;  Surgeon: Lanis Fonda BRAVO, MD;  Location: [facility] INVASIVE CV LAB;  Service: Cardiovascular;  Laterality: N/A;   ABDOMINAL AORTOGRAM W/LOWER EXTREMITY N/A 11/30/2023   Procedure: ABDOMINAL AORTOGRAM W/LOWER EXTREMITY;  Surgeon: Sheree Penne Bruckner, MD;  Location: [facility] INVASIVE CV LAB;  Service: Cardiovascular;  Laterality: N/A;   APPENDECTOMY     AV FISTULA PLACEMENT Left 10/05/2019   Procedure: INSERTION OF ARTERIOVENOUS (AV) GORE-TEX VASCULAR GRAFT LEFT ARM;  Surgeon: Oris Krystal FALCON, MD;  Location: [facility] OR;  Service: Vascular;  Laterality: Left;   AV FISTULA PLACEMENT Left 02/27/2021   Procedure: LEFT ARM FIRST STAGE BASILIC VEIN  ARTERIOVENOUS (AV) FISTULA CREATION;  Surgeon: Sheree Penne Bruckner, MD;  Location: [facility] OR;  Service: Vascular;  Laterality: Left;   BASCILIC VEIN TRANSPOSITION Left 04/17/2021   Procedure: LEFT SECOND STAGE BASCILIC VEIN TRANSPOSITION;  Surgeon: Sheree Penne Bruckner, MD;  Location: [facility] OR;  Service: Vascular;  Laterality: Left;   BIOPSY  07/24/2020   Procedure: BIOPSY;  Surgeon: Legrand Victory LITTIE DOUGLAS, MD;  Location: [facility]  ENDOSCOPY;  Service: Gastroenterology;;   BUBBLE STUDY  01/02/2020   Procedure: BUBBLE STUDY;  Surgeon: Loni Soyla LABOR, MD;  Location: [facility] ENDOSCOPY;  Service: Cardiology;;   CAPD INSERTION N/A 05/01/2023   Procedure: LAPAROSCOPIC INSERTION CONTINUOUS AMBULATORY PERITONEAL DIALYSIS  (CAPD) CATHETER;  Surgeon: Sheree Penne Bruckner, MD;  Location: [facility] OR;  Service: Vascular;   Laterality: N/A;   CARDIAC CATHETERIZATION     COLONOSCOPY WITH PROPOFOL  N/A 07/24/2020   Procedure: COLONOSCOPY WITH PROPOFOL ;  Surgeon: Legrand Victory LITTIE DOUGLAS, MD;  Location: [facility] ENDOSCOPY;  Service: Gastroenterology;  Laterality: N/A;   CORONARY STENT INTERVENTION N/A 07/12/2021   Procedure: CORONARY STENT INTERVENTION;  Surgeon: Dann Candyce RAMAN, MD;  Location: [facility] INVASIVE CV LAB;  Service: Cardiovascular;  Laterality: N/A;   CORONARY ULTRASOUND/IVUS N/A 07/12/2021   Procedure: Intravascular Ultrasound/IVUS;  Surgeon: Dann Candyce RAMAN, MD;  Location: [facility] INVASIVE CV LAB;  Service: Cardiovascular;  Laterality: N/A;   ESOPHAGOGASTRODUODENOSCOPY (EGD) WITH PROPOFOL  N/A 07/24/2020   Procedure: ESOPHAGOGASTRODUODENOSCOPY (EGD) WITH PROPOFOL ;  Surgeon: Legrand Victory LITTIE DOUGLAS, MD;  Location: [facility] ENDOSCOPY;  Service: Gastroenterology;  Laterality: N/A;   EYE SURGERY Bilateral    Cataract   FOREIGN BODY REMOVAL Left 05/13/2018   Procedure: FOREIGN BODY REMOVAL left foot;  Surgeon: Addie Cordella Hamilton, MD;  Location: [facility] OR;  Service: Orthopedics;  Laterality: Left;   I & D EXTREMITY Left 05/21/2018   Procedure: LEFT FOOT DEBRIDEMENT AND WOUND CLOSURE;  Surgeon: Harden Jerona GAILS, MD;  Location: [facility] OR;  Service: Orthopedics;  Laterality: Left;   LEFT HEART CATH AND CORONARY ANGIOGRAPHY N/A 07/12/2021   Procedure: LEFT HEART CATH AND CORONARY ANGIOGRAPHY;  Surgeon: Dann Candyce RAMAN, MD;  Location: [facility] INVASIVE CV LAB;  Service: Cardiovascular;  Laterality: N/A;   LIGATION OF ARTERIOVENOUS  FISTULA Left 01/15/2024   Procedure: LIGATION OF ARTERIOVENOUS  FISTULA;  Surgeon: Sheree Penne Bruckner, MD;  Location: [facility] OR;  Service: Vascular;  Laterality: Left;   LOWER EXTREMITY ANGIOGRAPHY N/A 06/06/2024   Procedure: Lower Extremity Angiography;  Surgeon: Sheree Penne Bruckner, MD;  Location: [facility] INVASIVE CV LAB;  Service: Cardiovascular;  Laterality: N/A;   PERIPHERAL VASCULAR BALLOON ANGIOPLASTY  11/30/2023    Procedure: PERIPHERAL VASCULAR BALLOON ANGIOPLASTY;  Surgeon: Sheree Penne Bruckner, MD;  Location: [facility] INVASIVE CV LAB;  Service: Cardiovascular;;  left AT   PERIPHERAL VASCULAR INTERVENTION  11/30/2023   Procedure: PERIPHERAL VASCULAR INTERVENTION;  Surgeon: Sheree Penne Bruckner, MD;  Location: [facility] INVASIVE CV LAB;  Service: Cardiovascular;;  stent left AT   POLYPECTOMY  07/24/2020   Procedure: POLYPECTOMY;  Surgeon: Legrand Victory LITTIE DOUGLAS, MD;  Location: [facility] ENDOSCOPY;  Service: Gastroenterology;;   SKIN DEBRIDEMENT Left 03/03/2024   Procedure: DEBRIDEMENT, SKIN;  Surgeon: Murrell Drivers, MD;  Location: [facility] OR;  Service: Orthopedics;  Laterality: Left;   TEE WITHOUT CARDIOVERSION N/A 01/02/2020   Procedure: TRANSESOPHAGEAL ECHOCARDIOGRAM (TEE);  Surgeon: Loni Soyla LABOR, MD;  Location: [facility] ENDOSCOPY;  Service: Cardiology;  Laterality: N/A;   TONSILLECTOMY     TUBAL LIGATION     UPPER EXTREMITY ANGIOGRAPHY Left 11/30/2023   Procedure: Upper Extremity Angiography;  Surgeon: Sheree Penne Bruckner, MD;  Location: [facility] INVASIVE CV LAB;  Service: Cardiovascular;  Laterality: Left;     reports that she quit smoking about 40 years ago. Her smoking use included cigarettes. She started smoking about 44 years ago. She has never been exposed to tobacco smoke. She has never used smokeless tobacco. She reports that she does not drink alcohol  and does  not use drugs.  No Known Allergies  Family History  Problem Relation Age of Onset   Hypertension Mother    Diabetes Mother    Hypertension Sister     Prior to Admission medications   Medication Sig Start Date End Date Taking? Authorizing Provider  acetaminophen  (TYLENOL  8 HOUR) 650 MG CR tablet Take 1 tablet (650 mg total) by mouth every 6 (six) hours as needed for pain or fever. 01/04/24   Charlyn Sora, MD  aspirin  EC 81 MG tablet Take 81 mg by mouth daily. Swallow whole.    [provider]  atorvastatin  (LIPITOR ) 80 MG tablet Take 80 mg  by mouth at bedtime.    [provider]  calcitRIOL  (ROCALTROL ) 0.25 MCG capsule Take 0.25 mcg by mouth daily.    [provider]  clopidogrel  (PLAVIX ) 75 MG tablet Take 1 tablet (75 mg total) by mouth daily. 01/06/24 01/05/25  Modesto Lucie PARAS, PA-C  collagenase  (SANTYL ) 250 UNIT/GM ointment Apply 1-2 grams to wound qd 07/06/24   Christine Rush, DPM  gabapentin  (NEURONTIN ) 100 MG capsule Take 100 mg by mouth 2 (two) times daily. 11/02/23   [provider]  Insulin  Glargine Solostar (LANTUS ) 100 UNIT/ML Solostar Pen Inject 20 Units into the skin daily. 11/11/22   [provider]  linagliptin  (TRADJENTA ) 5 MG TABS tablet Take 1 tablet (5 mg total) by mouth daily. 06/26/23   Thapa, Sudan, MD  metoCLOPramide  (REGLAN ) 5 MG tablet Take 5 mg by mouth Every Tuesday,Thursday,and Saturday with dialysis.    [provider]  midodrine  (PROAMATINE ) 10 MG tablet Take 10 mg by mouth 3 (three) times daily. 05/09/24   [provider]  ondansetron  (ZOFRAN -ODT) 4 MG disintegrating tablet Take 1 tablet (4 mg total) by mouth every 8 (eight) hours as needed for nausea or vomiting. 02/12/24   Alba Sharper, MD  potassium chloride  SA (KLOR-CON  M) 20 MEQ tablet Take 1 tablet (20 mEq total) by mouth 2 (two) times daily. 03/02/24   Cleotilde Perkins, DO  triamcinolone  lotion (KENALOG ) 0.1 % Apply 1 Application topically 2 (two) times daily as needed (irritation).    [provider]    Physical Exam: Vitals:   08/28/24 0830 08/28/24 0935 08/28/24 0938 08/28/24 1130  BP: 105/60 (!) 86/61  100/60  Pulse: 94 82  82  Resp: (!) 23 18  20   Temp:   98 F (36.7 C)   TempSrc:   Axillary   SpO2: 95% 95%  95%  Weight:      Height:          Constitutional: NAD, calm, comfortable-chronically ill-appearing Eyes: PERRL, lids and conjunctivae normal ENMT: Mucous membranes are moist. Posterior pharynx clear of any exudate or lesions.Normal dentition.  Neck: normal, supple, no  masses, no thyromegaly Respiratory: clear to auscultation bilaterally, no wheezing, no crackles. Normal respiratory effort. No accessory muscle use.  Cardiovascular: Regular rate and rhythm, no murmurs / rubs / gallops. No extremity edema. 2+ pedal pulses.  Abdomen: no tenderness, no masses palpated. Bowel sounds positive.  Musculoskeletal: no clubbing / cyanosis. Normal muscle tone.  Skin:         Psychiatric: Normal judgment and insight. Alert and oriented x 3. Normal mood.   Labs on Admission: I have personally reviewed following labs and imaging studies  CBC: Recent Labs  Lab 08/27/24 1853 08/27/24 1908  WBC 12.4*  --   NEUTROABS 9.1*  --   HGB 13.4 14.6  HCT 40.8 43.0  MCV 87.0  --  PLT 404*  --    Basic Metabolic Panel: Recent Labs  Lab 08/27/24 1853 08/27/24 1908 08/27/24 2101  NA 132* 131*  --   K 2.3* 2.1*  --   CL 90*  --   --   CO2 26  --   --   GLUCOSE 253*  --   --   BUN 13  --   --   CREATININE 6.31*  --   --   CALCIUM  9.2  --   --   MG  --   --  1.2*   GFR: Estimated Creatinine Clearance: 7.6 mL/min (A) (by C-G formula based on SCr of 6.31 mg/dL (H)). Liver Function Tests: Recent Labs  Lab 08/27/24 1853  AST 22  ALT 11  ALKPHOS 229*  BILITOT 0.5  PROT 6.3*  ALBUMIN  2.2*   No results for input(s): LIPASE, AMYLASE in the last 168 hours. Recent Labs  Lab 08/27/24 1853  AMMONIA 13   Coagulation Profile: No results for input(s): INR, PROTIME in the last 168 hours. Cardiac Enzymes: No results for input(s): CKTOTAL, CKMB, CKMBINDEX, TROPONINI in the last 168 hours. BNP (last 3 results) No results for input(s): PROBNP in the last 8760 hours. HbA1C: No results for input(s): HGBA1C in the last 72 hours. CBG: Recent Labs  Lab 08/27/24 1803 08/28/24 0817  GLUCAP 218* 175*   Lipid Profile: No results for input(s): CHOL, HDL, LDLCALC, TRIG, CHOLHDL, LDLDIRECT in the last 72 hours. Thyroid Function  Tests: No results for input(s): TSH, T4TOTAL, FREET4, T3FREE, THYROIDAB in the last 72 hours. Anemia Panel: No results for input(s): VITAMINB12, FOLATE, FERRITIN, TIBC, IRON , RETICCTPCT in the last 72 hours. Urine analysis:    Component Value Date/Time   COLORURINE YELLOW 05/31/2022 0612   APPEARANCEUR CLEAR 05/31/2022 0612   LABSPEC 1.012 05/31/2022 0612   PHURINE 8.0 05/31/2022 0612   GLUCOSEU >=500 (A) 05/31/2022 0612   HGBUR NEGATIVE 05/31/2022 0612   BILIRUBINUR NEGATIVE 05/31/2022 0612   KETONESUR NEGATIVE 05/31/2022 0612   PROTEINUR 100 (A) 05/31/2022 0612   UROBILINOGEN 1.0 07/13/2015 1543   NITRITE NEGATIVE 05/31/2022 0612   LEUKOCYTESUR NEGATIVE 05/31/2022 0612     Radiological Exams on Admission: CT ABDOMEN PELVIS WO CONTRAST Result Date: 08/27/2024 EXAM: CT ABDOMEN AND PELVIS WITHOUT CONTRAST 08/27/2024 07:36:00 PM TECHNIQUE: CT of the abdomen and pelvis was performed without the administration of intravenous contrast. Multiplanar reformatted images are provided for review. Automated exposure control, iterative reconstruction, and/or weight-based adjustment of the mA/kV was utilized to reduce the radiation dose to as low as reasonably achievable. COMPARISON: None available. CLINICAL HISTORY: Abdominal pain, acute, nonlocalized. FINDINGS: LOWER CHEST: No acute abnormality. LIVER: The liver is unremarkable. GALLBLADDER AND BILE DUCTS: Gallbladder is unremarkable. No biliary ductal dilatation. SPLEEN: No acute abnormality. PANCREAS: No acute abnormality. ADRENAL GLANDS: No acute abnormality. KIDNEYS, URETERS AND BLADDER: No stones in the kidneys or ureters. No hydronephrosis. No perinephric or periureteral stranding. Urinary bladder is unremarkable. GI AND BOWEL: Stomach demonstrates no acute abnormality. There is no bowel obstruction. No visible abnormal bowel or inflammation. PERITONEUM AND RETROPERITONEUM: Moderate to large volume ascites throughout the  abdomen and pelvis. Moderate pneumoperitoneum. Exact source is not clearly identified. This conceivably could be related to peritoneal dialysis. Peritoneal dialysis catheter in the pelvis. VASCULATURE: Aorta is normal in caliber. Diffuse aortoiliac and branch vessel atherosclerosis. LYMPH NODES: No lymphadenopathy. REPRODUCTIVE ORGANS: No acute abnormality. BONES AND SOFT TISSUES: No acute osseous abnormality. No focal soft tissue abnormality. IMPRESSION: 1. Moderate pneumoperitoneum  with moderate to large ascites; source not identified. Consider peritoneal dialysis as a potential cause. Recommend clinical correlation to exclude bowel gas. No abnormal bowel visualized. 2. Peritoneal dialysis catheter present in the pelvis. 3. Extensive aortoiliac and branch vessel atherosclerosis. Electronically signed by: Franky Crease MD 08/27/2024 07:50 PM EDT RP Workstation: [id number]   DG Chest Port 1 View Result Date: 08/27/2024 CLINICAL DATA:  Altered mental status. EXAM: PORTABLE CHEST 1 VIEW COMPARISON:  Feb 29, 2024 FINDINGS: The heart size and mediastinal contours are within normal limits. Low lung volumes are noted. No acute infiltrate, pleural effusion or pneumothorax is identified. Multilevel degenerative changes seen throughout the thoracic spine. IMPRESSION: Low lung volumes without acute or active cardiopulmonary disease. Electronically Signed   By: Suzen Dials M.D.   On: 08/27/2024 19:38   CT HEAD WO CONTRAST Result Date: 08/27/2024 CLINICAL DATA:  Altered mental status. EXAM: CT HEAD WITHOUT CONTRAST TECHNIQUE: Contiguous axial images were obtained from the base of the skull through the vertex without intravenous contrast. RADIATION DOSE REDUCTION: This exam was performed according to the departmental dose-optimization program which includes automated exposure control, adjustment of the mA and/or kV according to patient size and/or use of iterative reconstruction technique. COMPARISON:  February 22, 2024  FINDINGS: Brain: There is generalized cerebral atrophy with widening of the extra-axial spaces and ventricular dilatation. There are areas of decreased attenuation within the white matter tracts of the supratentorial brain, consistent with microvascular disease changes. Vascular: No hyperdense vessel or unexpected calcification. Skull: Normal. Negative for fracture or focal lesion. Sinuses/Orbits: Postoperative changes are seen involving the upper lobe. Other: None. IMPRESSION: 1. Generalized cerebral atrophy and microvascular disease changes of the supratentorial brain. 2. No acute intracranial abnormality. Electronically Signed   By: Suzen Dials M.D.   On: 08/27/2024 19:36      Assessment/Plan Principal Problem:   Hypokalemia  N/V/D-leading to electrolyte abnormalities -Check GI pathogen panel as well as C. difficile  -As needed Zofran  -CT scan done-shows ascites and peritoneal dialysis catheter: Fluid sent to rule out SBP-does not appear to be SBP-follow culture -Clear liquid diet for now, advance as tolerated  Multiple areas of wounds -Patient states her toes have looked like this for a while and does not feel there has been any change or sign of infection -WOC consult - Follows with podiatry outpatient-see above -has h/o PVD and had angiogram in 8/25 by Dr. Elisabeth above -dg foot- looking for osteo in toes  hypokalemia/hypomagnesemia -Check again as is been 24 hours since last labs and replace as needed -On potassium replacement at home daily  Elevated lactic acid -Recheck as has been 24 hours since checked  ESRD on PD -Nephrology consultation-it appears family did her peritoneal dialysis in the ER on 11/2 AM  DM -Sliding scale insulin   Right hip pain -Denies falling - Will get x-ray   DVT prophylaxis: Heparin  Code Status: full   Consults called: nephrology      Harlene RAYMOND Bowl [facility]   How to contact the [facility] Attending or Consulting provider  7A - 7P or covering provider during after hours 7P -7A, for this patient?  Check the care team in [facility] and look for a) attending/consulting [facility] provider listed and b) the [facility] team listed Log into [email or web address] and use 's universal password to access. If you do not have the password, please contact the hospital operator. Locate the [facility] provider you are looking for under [facility] and page to a number that you  can be directly reached. If you still have difficulty reaching the provider, please page the [facility] (Director on Call) for the Hospitalists listed on amion for assistance.  08/28/2024, 3:29 PM

## 2024-08-28 NOTE — Progress Notes (Signed)
 Hospitalist Transfer Note:    Nursing staff, Please call [facility] Admits & Consults System-Wide number on Amion [phone]) as soon as patient's arrival, so appropriate admitting provider can evaluate the pt.  Transferring facility: [facility] Requesting provider: Arthor Captain, PA (EDP at [facility]) Reason for transfer: admission for further evaluation and management of acute diverticulitis in the setting of failed outpatient antibiotics.     73 year old female  who presented to [facility] ED complaining of 3 weeks of progressive lower abdominal discomfort.  Approximately 1 week ago he had presented to his PCP with complaints of lower abdominal discomfort, at which time PCP was able to arrange for outpatient CT abdomen/pelvis which was suggestive of acute diverticulitis.  The patient subsequently completed a course of Cipro/Flagyl, but is noted further ensuing progression of his lower abdominal discomfort, prompting him to present to [facility] this evening.  Vital signs in the ED were notable for the following: Afebrile, heart rates in the 60s to 80s; systolic blood pressures in the 110s to 130s mmHg.   Imaging in the ED today notable for CT abdomen/pelvis with contrast, which showed acute diverticulitis without evidence of obstruction, perforation, or abscess.  Medications administered prior to transfer included the following: Zosyn   Subsequently, I accepted this patient for transfer for inpatient admission to a med/tele bed at [facility] or [facility]  (first available) for further work-up and management of the above.        Newton Pigg, DO Hospitalist

## 2024-08-29 ENCOUNTER — Inpatient Hospital Stay (HOSPITAL_COMMUNITY)

## 2024-08-29 DIAGNOSIS — R188 Other ascites: Secondary | ICD-10-CM | POA: Diagnosis present

## 2024-08-29 DIAGNOSIS — E785 Hyperlipidemia, unspecified: Secondary | ICD-10-CM | POA: Diagnosis present

## 2024-08-29 DIAGNOSIS — Z79899 Other long term (current) drug therapy: Secondary | ICD-10-CM | POA: Diagnosis not present

## 2024-08-29 DIAGNOSIS — Z66 Do not resuscitate: Secondary | ICD-10-CM | POA: Diagnosis present

## 2024-08-29 DIAGNOSIS — R6510 Systemic inflammatory response syndrome (SIRS) of non-infectious origin without acute organ dysfunction: Secondary | ICD-10-CM | POA: Diagnosis present

## 2024-08-29 DIAGNOSIS — D631 Anemia in chronic kidney disease: Secondary | ICD-10-CM | POA: Diagnosis present

## 2024-08-29 DIAGNOSIS — D72829 Elevated white blood cell count, unspecified: Secondary | ICD-10-CM | POA: Diagnosis present

## 2024-08-29 DIAGNOSIS — Z992 Dependence on renal dialysis: Secondary | ICD-10-CM | POA: Diagnosis not present

## 2024-08-29 DIAGNOSIS — E860 Dehydration: Secondary | ICD-10-CM | POA: Diagnosis present

## 2024-08-29 DIAGNOSIS — Z1152 Encounter for screening for COVID-19: Secondary | ICD-10-CM | POA: Diagnosis not present

## 2024-08-29 DIAGNOSIS — I96 Gangrene, not elsewhere classified: Secondary | ICD-10-CM | POA: Diagnosis not present

## 2024-08-29 DIAGNOSIS — E1152 Type 2 diabetes mellitus with diabetic peripheral angiopathy with gangrene: Secondary | ICD-10-CM | POA: Diagnosis present

## 2024-08-29 DIAGNOSIS — M62262 Nontraumatic ischemic infarction of muscle, left lower leg: Secondary | ICD-10-CM | POA: Diagnosis not present

## 2024-08-29 DIAGNOSIS — Z794 Long term (current) use of insulin: Secondary | ICD-10-CM | POA: Diagnosis not present

## 2024-08-29 DIAGNOSIS — E876 Hypokalemia: Secondary | ICD-10-CM | POA: Diagnosis present

## 2024-08-29 DIAGNOSIS — L89622 Pressure ulcer of left heel, stage 2: Secondary | ICD-10-CM | POA: Diagnosis present

## 2024-08-29 DIAGNOSIS — N186 End stage renal disease: Secondary | ICD-10-CM | POA: Diagnosis present

## 2024-08-29 DIAGNOSIS — E1122 Type 2 diabetes mellitus with diabetic chronic kidney disease: Secondary | ICD-10-CM | POA: Diagnosis present

## 2024-08-29 DIAGNOSIS — I120 Hypertensive chronic kidney disease with stage 5 chronic kidney disease or end stage renal disease: Secondary | ICD-10-CM | POA: Diagnosis present

## 2024-08-29 DIAGNOSIS — E872 Acidosis, unspecified: Secondary | ICD-10-CM | POA: Diagnosis present

## 2024-08-29 DIAGNOSIS — G8929 Other chronic pain: Secondary | ICD-10-CM | POA: Diagnosis present

## 2024-08-29 DIAGNOSIS — Z7984 Long term (current) use of oral hypoglycemic drugs: Secondary | ICD-10-CM | POA: Diagnosis not present

## 2024-08-29 DIAGNOSIS — I2510 Atherosclerotic heart disease of native coronary artery without angina pectoris: Secondary | ICD-10-CM | POA: Diagnosis present

## 2024-08-29 DIAGNOSIS — Z515 Encounter for palliative care: Secondary | ICD-10-CM | POA: Diagnosis not present

## 2024-08-29 DIAGNOSIS — Z7902 Long term (current) use of antithrombotics/antiplatelets: Secondary | ICD-10-CM | POA: Diagnosis not present

## 2024-08-29 LAB — COMPREHENSIVE METABOLIC PANEL WITH GFR
ALT: 16 U/L (ref 0–44)
AST: 24 U/L (ref 15–41)
Albumin: <1.5 g/dL — ABNORMAL LOW (ref 3.5–5.0)
Alkaline Phosphatase: 138 U/L — ABNORMAL HIGH (ref 38–126)
Anion gap: 12 (ref 5–15)
BUN: 15 mg/dL (ref 8–23)
CO2: 24 mmol/L (ref 22–32)
Calcium: 8.3 mg/dL — ABNORMAL LOW (ref 8.9–10.3)
Chloride: 97 mmol/L — ABNORMAL LOW (ref 98–111)
Creatinine, Ser: 6.63 mg/dL — ABNORMAL HIGH (ref 0.44–1.00)
GFR, Estimated: 6 mL/min — ABNORMAL LOW (ref >60)
Glucose, Bld: 90 mg/dL (ref 70–99)
Potassium: 4.4 mmol/L (ref 3.5–5.1)
Sodium: 133 mmol/L — ABNORMAL LOW (ref 135–145)
Total Bilirubin: 0.9 mg/dL (ref 0.0–1.2)
Total Protein: 4.8 g/dL — ABNORMAL LOW (ref 6.5–8.1)

## 2024-08-29 LAB — CBC
HCT: 35.7 % — ABNORMAL LOW (ref 36.0–46.0)
Hemoglobin: 11.8 g/dL — ABNORMAL LOW (ref 12.0–15.0)
MCH: 28.7 pg (ref 26.0–34.0)
MCHC: 33.1 g/dL (ref 30.0–36.0)
MCV: 86.9 fL (ref 80.0–100.0)
Platelets: 338 K/uL (ref 150–400)
RBC: 4.11 MIL/uL (ref 3.87–5.11)
RDW: 14.3 % (ref 11.5–15.5)
WBC: 13.9 K/uL — ABNORMAL HIGH (ref 4.0–10.5)
nRBC: 0.1 % (ref 0.0–0.2)

## 2024-08-29 LAB — SEDIMENTATION RATE: Sed Rate: 2 mm/h (ref 0–22)

## 2024-08-29 LAB — GLUCOSE, CAPILLARY
Glucose-Capillary: 88 mg/dL (ref 70–99)
Glucose-Capillary: 192 mg/dL — ABNORMAL HIGH (ref 70–99)
Glucose-Capillary: 225 mg/dL — ABNORMAL HIGH (ref 70–99)
Glucose-Capillary: 241 mg/dL — ABNORMAL HIGH (ref 70–99)

## 2024-08-29 LAB — MAGNESIUM: Magnesium: 1.4 mg/dL — ABNORMAL LOW (ref 1.7–2.4)

## 2024-08-29 LAB — C-REACTIVE PROTEIN: CRP: 0.9 mg/dL (ref <1.0)

## 2024-08-29 MED ORDER — PANTOPRAZOLE SODIUM 40 MG PO TBEC
40.0000 mg | DELAYED_RELEASE_TABLET | Freq: Every day | ORAL | Status: DC
  Administered 2024-08-29: 40 mg via ORAL
  Filled 2024-08-29 (×5): qty 1

## 2024-08-29 MED ORDER — INSULIN GLARGINE SOLOSTAR 100 UNIT/ML ~~LOC~~ SOPN: 8.0000 [IU] | PEN_INJECTOR | Freq: Every day | SUBCUTANEOUS | Status: DC

## 2024-08-29 MED ORDER — ASPIRIN 81 MG PO TBEC
81.0000 mg | DELAYED_RELEASE_TABLET | Freq: Every day | ORAL | Status: DC
  Administered 2024-08-30: 81 mg via ORAL
  Filled 2024-08-29 (×3): qty 1

## 2024-08-29 MED ORDER — DOXYCYCLINE HYCLATE 100 MG PO TABS
100.0000 mg | ORAL_TABLET | Freq: Two times a day (BID) | ORAL | Status: DC
Start: 2024-08-29 — End: 2024-08-30
  Administered 2024-08-29 – 2024-08-30 (×3): 100 mg via ORAL
  Filled 2024-08-29 (×3): qty 1

## 2024-08-29 MED ORDER — MAGNESIUM SULFATE 4 GM/100ML IV SOLN
4.0000 g | Freq: Once | INTRAVENOUS | Status: AC
  Administered 2024-08-29: 4 g via INTRAVENOUS
  Filled 2024-08-29: qty 100

## 2024-08-29 MED ORDER — INSULIN GLARGINE SOLOSTAR 100 UNIT/ML ~~LOC~~ SOPN: 10.0000 [IU] | PEN_INJECTOR | Freq: Every day | SUBCUTANEOUS | Status: DC

## 2024-08-29 MED ORDER — INSULIN GLARGINE-YFGN 100 UNIT/ML ~~LOC~~ SOLN
8.0000 [IU] | Freq: Every day | SUBCUTANEOUS | Status: DC
  Administered 2024-08-29 – 2024-08-30 (×2): 8 [IU] via SUBCUTANEOUS
  Filled 2024-08-29 (×3): qty 0.08

## 2024-08-29 MED ORDER — DELFLEX-LC/1.5% DEXTROSE 344 MOSM/L IP SOLN: INTRAPERITONEAL | Status: DC

## 2024-08-29 NOTE — Care Management Obs Status (Signed)
`  MEDICARE OBSERVATION STATUS NOTIFICATION   Patient Details  Name: Melissa Howe MRN: [id number] Date of Birth: 01-24-1951   Medicare Observation Status Notification Given:  Yes    Ji Fairburn Crawford 08/29/2024, 8:34 AM

## 2024-08-29 NOTE — Care Management Obs Status (Signed)
 MEDICARE OBSERVATION STATUS NOTIFICATION   Patient Details  Name: Melissa Howe MRN: [id number] Date of Birth: 05-May-1951   Medicare Observation Status Notification Given:  Yes Verbally reviewed observation notice with Melissa Howe telephonically at [phone].  Will leave a copy of the observation Notice in the patient room.      [name] 08/29/2024, 8:25 AM

## 2024-08-29 NOTE — Progress Notes (Signed)
 Peritoneal Dialysis Treatment Initiation Note  Pre TX VS:see table below  08/29/24 0752  Vitals  Temp 97.6 F (36.4 C)  Temp Source Oral  BP 97/67  MAP (mmHg) 77  BP Location Left Arm  BP Method Automatic  Patient Position (if appropriate) Lying  Pulse Rate 95  Pulse Rate Source Monitor  ECG Heart Rate 96  Resp 16  Level of Consciousness  Level of Consciousness Alert  MEWS COLOR  MEWS Score Color Green  Oxygen Therapy  SpO2 100 %  O2 Device Room Air  Pain Assessment  Pain Scale 0-10  Height and Weight  Weight 73.1 kg  Type of Scale Used Bed  Type of Weight Pre-Dialysis  BMI (Calculated) 28.99  MEWS Score  MEWS Temp 0  MEWS Systolic 1  MEWS Pulse 0  MEWS RR 0  MEWS LOC 0  MEWS Score 1    Pre TX weight:73.1kg  Consent signed and in chart. PD treatment initiated via aseptic technique.  Patient is alert and oriented. No complaints of pain.   No specimen collected. PD exit site clean, dry and intact. Gentamycin and new dressing applied., initial drain 2373  Hand-off given to the patient's nurse.  Bedside RN educated on PD machine and how to contact tech support when PD machine alarms.   Lorrene Glisson RN [facility] Dialysis [facility]

## 2024-08-29 NOTE — Progress Notes (Signed)
 Pt receives out-pt PD care through [facility] home therapy dept. Spoke to pt's PD RN to be advised of pt's hospital admission. Will assist as needed.   Randine Mungo Dialysis Navigator [phone]

## 2024-08-29 NOTE — Progress Notes (Signed)
 [facility] Progress Note  Name: Melissa Howe MRN: [id number] DOB: 03/24/1951  Chief Complaint:  Nausea/vomiting and headache  Subjective:  She wasn't able to get PD last night.  She was put on this AM.  Per PD RN, the patient had 2.5 liters taken from her abdomen when drained to be put on.  She has received potassium and magnesium  repletion.  Note PD cell count not concerning for infection; she was given vanc and Cefepime  on 11/1 (these do appear to have gone in shortly before her culture and fluid studies were obtained).  Per patient she uses 2.5 liter fill volumes - she and her daughter went between 2.5 - 3 liters.  Spoke with her daughter at bedside.  Patient seen and examined on PD today - procedure superviesd.    Review of systems:  Nausea recently with vomiting about three times a week for the past 3 weeks or so; history of intermittent diarrhea Headache improved She denies shortness of breath  ---------------- Background on consult:  The patient is a 73 y.o. year-old w/ PMH of DM, ESRD on PD,HTN, PAD w/ chronic foot wounds presented 11/01 last night c/o nausea/ vomiting and HA for 3 wks. Then started have AMS prior to coming to ED. No f/c/s. In ED BP 110/76, HR 93, RR 20, temp 97.6, 100% sats on RA. Labs showed K+ 2.3, BUN 13, creat 6.31, CA 9.2, alb 2.2, Mg 1.2, NH3 13, tbili 0.5. LA 5.6, 5.7, 4.8. WBC 12K, Hb 13. Abd CT showed moderate pneumoperitoneum with moderate to large ascites. Consider peritoneal dialysis as potential cause of air and liquid. Pt was vanc/ cefepime  IV abx. Her foot showed gangrenous toe changes and a heel wound. Pt rec'd K+ supplementation. GI pathogen panel was ordered and Cdif. Clear liquid was ordered. Pt was admitted. We are asked to see for ESRD.  Pt seen in room. No c/o's at this time. States that they were in an Urgent Care place for about 24 hrs prior to being sent to ED Sat evening. While at the Urgent Care her daughter brought some PD  fluid and did an exchange at the Urgent Care. There are PD fluids in the labs for cell count and culture, they are labeled 11/01 and they were collected by the daughter and the staff at the Urgent Care apparently as far as I can tell from the pt's history.      No intake or output data in the 24 hours ending 08/29/24 1133  Vitals:  Vitals:   08/28/24 2200 08/29/24 0000 08/29/24 0752 08/29/24 0800  BP:  113/65 97/67 118/69  Pulse: 81 99 95 97  Resp: 17 15 16 17   Temp:  97.8 F (36.6 C) 97.6 F (36.4 C)   TempSrc:  Axillary Oral   SpO2: (!) 88% 95% 100% 98%  Weight:   73.1 kg   Height:         Physical Exam:  General elderly in bed in no acute distress HEENT normocephalic atraumatic extraocular movements intact sclera anicteric Neck supple trachea midline Lungs clear to auscultation bilaterally normal work of breathing at rest  Heart S1S2 no rub Abdomen soft nontender nondistended; obese habitus Extremities no edema appreciated Psych normal mood and affect Neuro alert and oriented x3 provides hx and follows commands  Access: PD catheter in place and in use; clean and nontender  Medications reviewed   Labs:     Latest Ref Rng & Units 08/29/2024    4:55 AM  08/28/2024    3:56 PM 08/27/2024    7:08 PM  BMP  Glucose 70 - 99 mg/dL 90  832    BUN 8 - 23 mg/dL 15  13    Creatinine 9.55 - 1.00 mg/dL 3.36  3.78    Sodium 864 - 145 mmol/L 133  133  131   Potassium 3.5 - 5.1 mmol/L 4.4  2.9  2.1   Chloride 98 - 111 mmol/L 97  95    CO2 22 - 32 mmol/L 24  23    Calcium  8.9 - 10.3 mg/dL 8.3  8.3      OP PD: [facility] CAPD 7 days / wk 77kg   dwell vol 3000cc  3 exchanges/ day  8hr dwell time Last Hb 12.1 on 10/8 Last pth 177, Ca 9.3   Assessment/Plan:    N/V/D: causing hypokalemia, per pmd. No signs of peritonitis (no abd pain, non-tender exam, TNC = 4 from PD fluid sent 11/1).  Lactic acidosis: note cx's sent, s/p abx; per primary team.  11/1 blood culture NGTD. No growth on  peritoneal cultures ESRD: on PD. Last full PD was likely 10/30 or 10/31. The daughter did one exchange while they were waiting at the Urgent Care on Saturday prior to admission. She is on CAPD at home (3 exchanges each 8 hrs, has fluid in around the clock); here we will do 5 exchanges of 2 hrs each.   We will transition to PD overnight after today's treatment (tx today and then again tomorrow night).   Hypokalemia/ hypomagnesemia: primary team looking for GI infection to treat, supplementing KCl IV and po as needed, and IV magnesium  sulfate as well.  Volume: looks euvolemic on exam.  Using all 1.5% fluids w/ hx n/v/d.  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  Anemia of esrd:  no indication for ESA here  Disposition - continue inpatient monitoring.        Katheryn JAYSON Saba, MD 08/29/2024 12:03 PM

## 2024-08-29 NOTE — TOC CM/SW Note (Signed)
 Transition of Care [facility]) - Inpatient Brief Assessment   Patient Details  Name: Melissa Howe MRN: [id number] Date of Birth: 1951/01/29  [facility] CM/SW Contact:    Tom-Johnson, Harvest Muskrat, RN Phone Number: 08/29/2024, 3:46 PM   Clinical Narrative:  Patient presented to the ED with Nausea, Vomiting, Diarrhea. Admitted with Hypokalemia. CT showed ascites.  Patient has hx of ESRD on Peritoneal Dialysis at home, DM, HTN, PVD with chronic wounds to bilateral Feet and followed outpatient by Podiatry.    CM spoke with patient at bedside and daughter, Crystal via phone [phone]) about discharge disposition. Patient states she lives with her two daughters, has three supportive children. Has an Aide 5 days/week from [employer]. Has necessary DME's at home.  PCP is Claudene Prentice [name], FNP and uses [facility] [address].  CM spoke with patient about Home health recommendation, patient states she was active with [facility] in July and would lie to use their services again. CM called in referral to [facility] and sent via HUB with acceptance noted, info on AVS.   Patient not Medically ready for discharge.  CM will continue to follow as patient progresses with care towards discharge.          Transition of Care Asessment: Insurance and Status: Insurance coverage has been reviewed Patient has primary care physician: Yes Home environment has been reviewed: Yes Prior level of function:: Modified Independent Prior/Current Home Services: No current home services Social Drivers of Health Review: SDOH reviewed no interventions necessary Readmission risk has been reviewed: Yes Transition of care needs: transition of care needs identified, TOC will continue to follow

## 2024-08-29 NOTE — Progress Notes (Signed)
 PROGRESS NOTE                                                                                                                                                                                                             Patient Demographics:    Melissa Howe, is a 73 y.o. female, DOB - 1950/11/12, FMW:[id number]  Outpatient Primary MD for the patient is Claudene Prentice [name], FNP    LOS - 0  Admit date - 08/27/2024    Chief Complaint  Patient presents with   Emesis   Headache       Brief Narrative (HPI from H&P)    73 y.o. female with medical history significant of end-stage renal disease on peritoneal dialysis, diabetes, hypertension, peripheral vascular disease with chronic foot wounds.  She comes in to the [facility] complaining of recurrent nausea, vomiting, diarrhea over the last 2 days.  Her vomitus was nonbloody, nonbilious.  Patient denies fevers chills, abdominal pain. Patient has chronic wounds on her feet and she is followed by podiatry for this -Last seen on 10/28 with diagnosis of Full-thickness Wagner grade 2 ulceration posterior aspect heel left -somewhat larger.  At that time she was debrided, dressed with Silvadene cream and given a surgical shoe.  I also referred her to wound care clinic   Patient also follows with Dr. Sheree for peripheral vascular disease.  She had a angiogram on 8/11.  The findings of Aorta and iliac segments are calcified however there is no flow-limiting stenosis.     In the ER she was given antibiotics including Vanco and cefepime  for possible SBP.  CT head was done without abnormalities.  CT abdomen pelvis also done showing ascites, laboratory data was abnormal in regards to potassium, magnesium  and lactic acid   Subjective:    Melissa Howe today has, No headache, No chest pain, No abdominal pain - No Nausea, No new weakness tingling or numbness, no shortness of breath   Assessment  &  Plan :   N/V/D-leading to electrolyte abnormalities - To have been gastroenteritis, abdominal exam benign, CT in the setting of PD catheter unremarkable, she did get dehydrated, currently with supportive care no nausea vomiting or diarrhea, will continue to monitor.  Will advance to full liquid diet and monitor on PPI and supportive care.   Multiple areas of wounds  in both feet present on admission follows with podiatry outpatient. -Patient states her toes have looked like this for a while and does not feel there has been any change or sign of infection -WOC consult -has h/o PVD and had angiogram in 8/25 by Dr. Rollo not show any significant stenosis Currently on empiric antibiotic although I do not think she has significant cellulitis, follow cultures, podiatry consulted.   hypokalemia/hypomagnesemia - Replaced   Elevated lactic acid - Due to dehydration no clinical signs of sepsis   ESRD on PD -Nephrology consultated   Right hip pain at the time of admission - Stable x-ray, currently pain-free will monitor  DM2 -Sliding scale insulin    Lab Results  Component Value Date   HGBA1C 5.7 (H) 08/28/2024   CBG (last 3)  Recent Labs    08/28/24 1615 08/28/24 2152 08/29/24 0752  GLUCAP 163* 92 88        Condition - Extremely Guarded  Family Communication  :   daughter crystal [phone]  on 08/29/24  Code Status :  Full  Consults  :  Renal  PUD Prophylaxis :  PPI   Procedures  :     CT head.  Nonacute.    CT abdomen pelvis. 1. Moderate pneumoperitoneum with moderate to large ascites; source not identified. Consider peritoneal dialysis as a potential cause. Recommend clinical correlation to exclude bowel gas. No abnormal bowel visualized. 2. Peritoneal dialysis catheter present in the pelvis. 3. Extensive aortoiliac and branch vessel atherosclerosis      Disposition Plan  :    Status is: Inpatient   DVT Prophylaxis  :    heparin  injection 5,000 Units  Start: 08/28/24 1630    Lab Results  Component Value Date   PLT 338 08/29/2024    Diet :  Diet Order             Diet clear liquid Room service appropriate? Yes; Fluid consistency: Thin  Diet effective now                    Inpatient Medications  Scheduled Meds:  atorvastatin   80 mg Oral QHS   calcitRIOL   0.25 mcg Oral Daily   clopidogrel   75 mg Oral Daily   collagenase    Topical Daily   gentamicin  cream  1 Application Topical Daily   heparin   5,000 Units Subcutaneous Q8H   insulin  aspart  0-5 Units Subcutaneous QHS   insulin  aspart  0-6 Units Subcutaneous TID WC   midodrine   10 mg Oral TID   potassium chloride  SA  20 mEq Oral BID   Continuous Infusions:  dialysis solution 1.5% low-MG/low-CA     magnesium  sulfate bolus IVPB     PRN Meds:.acetaminophen  **OR** acetaminophen , ondansetron  (ZOFRAN ) IV, prochlorperazine  Antibiotics  :    Anti-infectives (From admission, onward)    Start     Dose/Rate Route Frequency Ordered Stop   08/27/24 2130  ceFEPIme  (MAXIPIME ) 2 g in sodium chloride  0.9 % 100 mL IVPB        2 g 200 mL/hr over 30 Minutes Intravenous  Once 08/27/24 2120 08/27/24 2214   08/27/24 2033  vancomycin  (VANCOCIN ) 750 MG injection       Note to Pharmacy: Sandie Stapler G: cabinet override      08/27/24 2033 08/28/24 0844   08/27/24 2020  vancomycin  (VANCOCIN ) 750 MG injection  Status:  Discontinued       Note to Pharmacy: Sandie Stapler MATSU: cabinet override  08/27/24 2020 08/27/24 2023   08/27/24 1945  cefTAZidime (FORTAZ) 2 g in sodium chloride  0.9 % 100 mL IVPB  Status:  Discontinued        2 g 200 mL/hr over 30 Minutes Intravenous  Once 08/27/24 1937 08/27/24 2116   08/27/24 1945  vancomycin  (VANCOCIN ) IVPB 1000 mg/200 mL premix       Placed in Followed by Linked Group   1,000 mg 200 mL/hr over 60 Minutes Intravenous  Once 08/27/24 1944 08/27/24 2112   08/27/24 1945  vancomycin  (VANCOREADY) IVPB 750 mg/150 mL       Placed in  Followed by Linked Group   750 mg 150 mL/hr over 60 Minutes Intravenous  Once 08/27/24 1944 08/27/24 2131         Objective:   Vitals:   08/28/24 2000 08/28/24 2200 08/29/24 0000 08/29/24 0752  BP:   113/65 97/67  Pulse:  81 99 95  Resp: 18 17 15 16   Temp:   97.8 F (36.6 C) 97.6 F (36.4 C)  TempSrc:   Axillary Oral  SpO2:  (!) 88% 95% 100%  Weight:    73.1 kg  Height:        Wt Readings from Last 3 Encounters:  08/29/24 73.1 kg  06/06/24 76.7 kg  05/11/24 74.4 kg    No intake or output data in the 24 hours ending 08/29/24 0859   Physical Exam  Awake Alert, No new F.N deficits, Normal affect .AT,PERRAL Supple Neck, No JVD,   Symmetrical Chest wall movement, Good air movement bilaterally, CTAB RRR,No Gallops,Rubs or new Murmurs,  +ve B.Sounds, Abd Soft, No tenderness,   No Cyanosis, Clubbing or edema       Data Review:    Recent Labs  Lab 08/27/24 1853 08/27/24 1908 08/29/24 0455  WBC 12.4*  --  13.9*  HGB 13.4 14.6 11.8*  HCT 40.8 43.0 35.7*  PLT 404*  --  338  MCV 87.0  --  86.9  MCH 28.6  --  28.7  MCHC 32.8  --  33.1  RDW 13.8  --  14.3  LYMPHSABS 2.2  --   --   MONOABS 0.8  --   --   EOSABS 0.2  --   --   BASOSABS 0.0  --   --     Recent Labs  Lab 08/27/24 1851 08/27/24 1853 08/27/24 1908 08/27/24 2101 08/28/24 0029 08/28/24 0229 08/28/24 1556 08/29/24 0455  NA  --  132* 131*  --   --   --  133* 133*  K  --  2.3* 2.1*  --   --   --  2.9* 4.4  CL  --  90*  --   --   --   --  95* 97*  CO2  --  26  --   --   --   --  23 24  ANIONGAP  --  17*  --   --   --   --  15 12  GLUCOSE  --  253*  --   --   --   --  167* 90  BUN  --  13  --   --   --   --  13 15  CREATININE  --  6.31*  --   --   --   --  6.21* 6.63*  AST  --  22  --   --   --   --   --  24  ALT  --  11  --   --   --   --   --  16  ALKPHOS  --  229*  --   --   --   --   --  138*  BILITOT  --  0.5  --   --   --   --   --  0.9  ALBUMIN   --  2.2*  --   --   --   --   --   <1.5*  LATICACIDVEN 5.6*  --   --  5.7* 5.0* 4.8* 4.7*  --   HGBA1C  --   --   --   --   --   --  5.7*  --   AMMONIA  --  13  --   --   --   --   --   --   MG  --   --   --  1.2*  --   --  1.3* 1.4*  CALCIUM   --  9.2  --   --   --   --  8.3* 8.3*      Recent Labs  Lab 08/27/24 1851 08/27/24 1853 08/27/24 2101 08/28/24 0029 08/28/24 0229 08/28/24 1556 08/29/24 0455  LATICACIDVEN 5.6*  --  5.7* 5.0* 4.8* 4.7*  --   HGBA1C  --   --   --   --   --  5.7*  --   AMMONIA  --  13  --   --   --   --   --   MG  --   --  1.2*  --   --  1.3* 1.4*  CALCIUM   --  9.2  --   --   --  8.3* 8.3*    --------------------------------------------------------------------------------------------------------------- Lab Results  Component Value Date   CHOL 99 02/10/2024   HDL 23 (L) 02/10/2024   LDLCALC 55 02/10/2024   TRIG 105 02/10/2024   CHOLHDL 4.3 02/10/2024    Lab Results  Component Value Date   HGBA1C 5.7 (H) 08/28/2024      Micro Results Recent Results (from the past 240 hours)  Blood culture (routine x 2)     Status: None (Preliminary result)   Collection Time: 08/27/24  6:51 PM   Specimen: BLOOD  Result Value Ref Range Status   Specimen Description   Final    BLOOD RIGHT ANTECUBITAL Performed at [facility], [address] [place], [place] [address]    Special Requests   Final    Blood Culture adequate volume Performed at [facility], [address] [place], [place] [address]    Culture   Final    NO GROWTH 2 DAYS Performed at [facility], [address] [place], [place] [address]    Report Status PENDING  Incomplete  Body fluid culture w Gram Stain     Status: None (Preliminary result)   Collection Time: 08/27/24  7:43 PM   Specimen: Peritoneal Cavity; Peritoneal Fluid  Result Value Ref Range Status   Specimen Description   Final    PERITONEAL CAVITY Performed at [facility], [address] [place], [place] [address]    Special Requests   Final    NONE Performed at [facility], [address] [place], [place] [address]    Gram Stain NO WBC SEEN NO ORGANISMS SEEN   Final   Culture   Final    NO GROWTH 2 DAYS Performed at [facility], [address], [place], [place] [address]    Report Status PENDING  Incomplete  Resp panel by RT-PCR (RSV, Flu A&B, Covid) Peripheral     Status: None   Collection Time: 08/27/24  7:43 PM   Specimen: Peripheral; Nasal Swab  Result Value Ref Range Status   SARS Coronavirus 2 by RT PCR NEGATIVE NEGATIVE Final    Comment: (NOTE) SARS-CoV-2 target nucleic acids are NOT DETECTED.  The SARS-CoV-2 RNA is generally detectable in upper respiratory specimens during the acute phase of infection. The lowest concentration of SARS-CoV-2 viral copies this assay can detect is 138 copies/mL. A negative result does not preclude SARS-Cov-2 infection and should not be used as the sole basis for treatment or other patient management decisions. A negative result may occur with  improper specimen collection/handling, submission of specimen other than nasopharyngeal swab, presence of viral mutation(s) within the areas targeted by this assay, and inadequate number of viral copies(<138 copies/mL). A negative result must be combined with clinical observations, patient history, and epidemiological information. The expected result is Negative.  Fact Sheet for Patients:  bloggercourse.com  Fact Sheet for Healthcare Providers:  seriousbroker.it  This test is no t yet approved or cleared by the [place]  FDA and  has been authorized for detection and/or diagnosis of SARS-CoV-2 by FDA under an Emergency Use Authorization (EUA). This EUA will remain  in effect (meaning this test can be used) for the duration of the COVID-19 declaration under Section 564(b)(1) of the Act,  21 U.S.C.section 360bbb-3(b)(1), unless the authorization is terminated  or revoked sooner.       Influenza A by PCR NEGATIVE NEGATIVE Final   Influenza B by PCR NEGATIVE NEGATIVE Final    Comment: (NOTE) The Xpert Xpress SARS-CoV-2/FLU/RSV plus assay is intended as an aid in the diagnosis of influenza from Nasopharyngeal swab specimens and should not be used as a sole basis for treatment. Nasal washings and aspirates are unacceptable for Xpert Xpress SARS-CoV-2/FLU/RSV testing.  Fact Sheet for Patients: bloggercourse.com  Fact Sheet for Healthcare Providers: seriousbroker.it  This test is not yet approved or cleared by the [place]  FDA and has been authorized for detection and/or diagnosis of SARS-CoV-2 by FDA under an Emergency Use Authorization (EUA). This EUA will remain in effect (meaning this test can be used) for the duration of the COVID-19 declaration under Section 564(b)(1) of the Act, 21 U.S.C. section 360bbb-3(b)(1), unless the authorization is terminated or revoked.     Resp Syncytial Virus by PCR NEGATIVE NEGATIVE Final    Comment: (NOTE) Fact Sheet for Patients: bloggercourse.com  Fact Sheet for Healthcare Providers: seriousbroker.it  This test is not yet approved or cleared by the [place]  FDA and has been authorized for detection and/or diagnosis of SARS-CoV-2 by FDA under an Emergency Use Authorization (EUA). This EUA will remain in effect (meaning this test can be used) for the duration of the COVID-19 declaration under Section 564(b)(1) of the Act, 21 U.S.C. section 360bbb-3(b)(1), unless the authorization is terminated or revoked.  Performed at [facility], [address] [place], [place] [address]   Blood culture (routine x 2)     Status: None (Preliminary result)   Collection Time: 08/27/24  7:50 PM   Specimen:  BLOOD RIGHT HAND  Result Value Ref Range Status   Specimen Description   Final    BLOOD RIGHT HAND Performed at [facility], [address], [place], [place] [address]    Special Requests  Final    Blood Culture adequate volume Performed at [facility], [address] [place], [place] [address]    Culture   Final    NO GROWTH 2 DAYS Performed at [facility], [address] [place], [place] [address]    Report Status PENDING  Incomplete    Radiology Report DG Foot 2 Views Right Result Date: 08/28/2024 CLINICAL DATA:  Osteomyelitis EXAM: RIGHT FOOT - 2 VIEW COMPARISON:  None Available. FINDINGS: There is no evidence of fracture or dislocation. Diffuse degenerative changes of the foot. Dorsal and plantar calcaneal spur. Vascular calcifications. No cortical erosions. Soft tissues are unremarkable. IMPRESSION: 1. No radiographic evidence of osteomyelitis. 2. Diffuse degenerative changes of the foot. Electronically Signed   By: Limin  Xu M.D.   On: 08/28/2024 17:46   DG HIP UNILAT WITH PELVIS 2-3 VIEWS RIGHT Result Date: 08/28/2024 CLINICAL DATA:  Right hip pain EXAM: DG HIP (WITH OR WITHOUT PELVIS) 3V RIGHT COMPARISON:  CT abdomen and pelvis dated 08/27/2024 FINDINGS: There is no evidence of hip fracture or dislocation. Mild degenerative changes of bilateral hips. Vascular calcifications. Partially imaged peritoneal catheter is coiled in the left hemipelvis. IMPRESSION: Mild degenerative changes of bilateral hips. No acute fracture or dislocation. Electronically Signed   By: Limin  Xu M.D.   On: 08/28/2024 17:39   CT ABDOMEN PELVIS WO CONTRAST Result Date: 08/27/2024 EXAM: CT ABDOMEN AND PELVIS WITHOUT CONTRAST 08/27/2024 07:36:00 PM TECHNIQUE: CT of the abdomen and pelvis was performed without the administration of intravenous contrast. Multiplanar reformatted images are provided for review. Automated exposure control, iterative reconstruction, and/or  weight-based adjustment of the mA/kV was utilized to reduce the radiation dose to as low as reasonably achievable. COMPARISON: None available. CLINICAL HISTORY: Abdominal pain, acute, nonlocalized. FINDINGS: LOWER CHEST: No acute abnormality. LIVER: The liver is unremarkable. GALLBLADDER AND BILE DUCTS: Gallbladder is unremarkable. No biliary ductal dilatation. SPLEEN: No acute abnormality. PANCREAS: No acute abnormality. ADRENAL GLANDS: No acute abnormality. KIDNEYS, URETERS AND BLADDER: No stones in the kidneys or ureters. No hydronephrosis. No perinephric or periureteral stranding. Urinary bladder is unremarkable. GI AND BOWEL: Stomach demonstrates no acute abnormality. There is no bowel obstruction. No visible abnormal bowel or inflammation. PERITONEUM AND RETROPERITONEUM: Moderate to large volume ascites throughout the abdomen and pelvis. Moderate pneumoperitoneum. Exact source is not clearly identified. This conceivably could be related to peritoneal dialysis. Peritoneal dialysis catheter in the pelvis. VASCULATURE: Aorta is normal in caliber. Diffuse aortoiliac and branch vessel atherosclerosis. LYMPH NODES: No lymphadenopathy. REPRODUCTIVE ORGANS: No acute abnormality. BONES AND SOFT TISSUES: No acute osseous abnormality. No focal soft tissue abnormality. IMPRESSION: 1. Moderate pneumoperitoneum with moderate to large ascites; source not identified. Consider peritoneal dialysis as a potential cause. Recommend clinical correlation to exclude bowel gas. No abnormal bowel visualized. 2. Peritoneal dialysis catheter present in the pelvis. 3. Extensive aortoiliac and branch vessel atherosclerosis. Electronically signed by: Franky Crease MD 08/27/2024 07:50 PM EDT RP Workstation: [id number]   DG Chest Port 1 View Result Date: 08/27/2024 CLINICAL DATA:  Altered mental status. EXAM: PORTABLE CHEST 1 VIEW COMPARISON:  Feb 29, 2024 FINDINGS: The heart size and mediastinal contours are within normal limits. Low lung  volumes are noted. No acute infiltrate, pleural effusion or pneumothorax is identified. Multilevel degenerative changes seen throughout the thoracic spine. IMPRESSION: Low lung volumes without acute or active cardiopulmonary disease. Electronically Signed   By: Suzen Dials M.D.   On: 08/27/2024 19:38   CT HEAD WO CONTRAST Result Date:  08/27/2024 CLINICAL DATA:  Altered mental status. EXAM: CT HEAD WITHOUT CONTRAST TECHNIQUE: Contiguous axial images were obtained from the base of the skull through the vertex without intravenous contrast. RADIATION DOSE REDUCTION: This exam was performed according to the departmental dose-optimization program which includes automated exposure control, adjustment of the mA and/or kV according to patient size and/or use of iterative reconstruction technique. COMPARISON:  February 22, 2024 FINDINGS: Brain: There is generalized cerebral atrophy with widening of the extra-axial spaces and ventricular dilatation. There are areas of decreased attenuation within the white matter tracts of the supratentorial brain, consistent with microvascular disease changes. Vascular: No hyperdense vessel or unexpected calcification. Skull: Normal. Negative for fracture or focal lesion. Sinuses/Orbits: Postoperative changes are seen involving the upper lobe. Other: None. IMPRESSION: 1. Generalized cerebral atrophy and microvascular disease changes of the supratentorial brain. 2. No acute intracranial abnormality. Electronically Signed   By: Suzen Dials M.D.   On: 08/27/2024 19:36     Signature  -   Lavada Stank M.D on 08/29/2024 at 8:59 AM   -  To page go to [email or web address]

## 2024-08-29 NOTE — Plan of Care (Signed)

## 2024-08-29 NOTE — Consult Note (Addendum)
 Hospital Consult    Reason for Consult:  foot wounds Requesting Physician:  Standiford MRN #:  [id number]  History of Present Illness: This is a 73 y.o. female who presented to the hospital with nausea, vomiting and diarrhea.    She has hx of ESRD on PD, DM, HTN.  She has been started on IV abx.    Pt has hx of angiogram with stent of left ATA on 11/30/2023 by Dr. Sheree for 2nd toe ulceration with left ATA stent.  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 The fistula did not appear healthy but did not appear to have significant steal from the left hand. She has hx of PD cath placement by Dr. Sheree on 05/01/2023.  She did undergo ligation of LUA AVF on 01/15/2024 by Dr. Sheree.     She was seen in our office in July and found to have bilateral lower extremity ulcerations and underwent angiogram on 06/06/2024 and she did not have any revascularization options and needed continued wound care at direction of podiatry.    Podiatry has requested vascular evaluation.    The pt is on a statin for cholesterol management.  The pt is on a daily aspirin .   Other AC:  Plavix  The pt is not on medication for hypertension.   The pt is  on medication for diabetes PTA. Tobacco hx:  former  Past Medical History:  Diagnosis Date   Anemia    CHF (congestive heart failure) (HCC)    Constipation    Coronary artery disease    Diabetes mellitus    Type II   Dyspnea    with exertion   ESRD (end stage renal disease) (HCC)    dialyzing 3 times a day with peritoneal dialysis catheter that was placed in 04/2023   History of blood transfusion    Hyperlipemia    Hypertension    Ischemic cardiomyopathy    Myocardial infarction [facility])    Peripheral vascular disease    Pneumonia 2019    Past Surgical History:  Procedure Laterality Date   ABDOMINAL AORTOGRAM N/A 06/06/2024   Procedure: ABDOMINAL AORTOGRAM;  Surgeon: Sheree Penne Bruckner, MD;  Location: [facility]  INVASIVE CV LAB;  Service: Cardiovascular;  Laterality: N/A;   ABDOMINAL AORTOGRAM W/LOWER EXTREMITY N/A 10/15/2022   Procedure: ABDOMINAL AORTOGRAM W/LOWER EXTREMITY;  Surgeon: Lanis Fonda BRAVO, MD;  Location: [facility] INVASIVE CV LAB;  Service: Cardiovascular;  Laterality: N/A;   ABDOMINAL AORTOGRAM W/LOWER EXTREMITY N/A 11/30/2023   Procedure: ABDOMINAL AORTOGRAM W/LOWER EXTREMITY;  Surgeon: Sheree Penne Bruckner, MD;  Location: [facility] INVASIVE CV LAB;  Service: Cardiovascular;  Laterality: N/A;   APPENDECTOMY     AV FISTULA PLACEMENT Left 10/05/2019   Procedure: INSERTION OF ARTERIOVENOUS (AV) GORE-TEX VASCULAR GRAFT LEFT ARM;  Surgeon: Oris Krystal FALCON, MD;  Location: [facility] OR;  Service: Vascular;  Laterality: Left;   AV FISTULA PLACEMENT Left 02/27/2021   Procedure: LEFT ARM FIRST STAGE BASILIC VEIN  ARTERIOVENOUS (AV) FISTULA CREATION;  Surgeon: Sheree Penne Bruckner, MD;  Location: [facility] OR;  Service: Vascular;  Laterality: Left;   BASCILIC VEIN TRANSPOSITION Left 04/17/2021   Procedure: LEFT SECOND STAGE BASCILIC VEIN TRANSPOSITION;  Surgeon: Sheree Penne Bruckner, MD;  Location: [facility] OR;  Service: Vascular;  Laterality: Left;   BIOPSY  07/24/2020   Procedure: BIOPSY;  Surgeon: Legrand Victory LITTIE DOUGLAS, MD;  Location: [facility] ENDOSCOPY;  Service: Gastroenterology;;   BUBBLE STUDY  01/02/2020   Procedure: BUBBLE  STUDY;  Surgeon: Loni Soyla LABOR, MD;  Location: [facility] ENDOSCOPY;  Service: Cardiology;;   CAPD INSERTION N/A 05/01/2023   Procedure: LAPAROSCOPIC INSERTION CONTINUOUS AMBULATORY PERITONEAL DIALYSIS  (CAPD) CATHETER;  Surgeon: Sheree Penne Bruckner, MD;  Location: [facility] OR;  Service: Vascular;  Laterality: N/A;   CARDIAC CATHETERIZATION     COLONOSCOPY WITH PROPOFOL  N/A 07/24/2020   Procedure: COLONOSCOPY WITH PROPOFOL ;  Surgeon: Legrand Victory LITTIE DOUGLAS, MD;  Location: [facility] ENDOSCOPY;  Service: Gastroenterology;  Laterality: N/A;   CORONARY STENT INTERVENTION N/A 07/12/2021   Procedure: CORONARY STENT  INTERVENTION;  Surgeon: Dann Candyce RAMAN, MD;  Location: [facility] INVASIVE CV LAB;  Service: Cardiovascular;  Laterality: N/A;   CORONARY ULTRASOUND/IVUS N/A 07/12/2021   Procedure: Intravascular Ultrasound/IVUS;  Surgeon: Dann Candyce RAMAN, MD;  Location: [facility] INVASIVE CV LAB;  Service: Cardiovascular;  Laterality: N/A;   ESOPHAGOGASTRODUODENOSCOPY (EGD) WITH PROPOFOL  N/A 07/24/2020   Procedure: ESOPHAGOGASTRODUODENOSCOPY (EGD) WITH PROPOFOL ;  Surgeon: Legrand Victory LITTIE DOUGLAS, MD;  Location: [facility] ENDOSCOPY;  Service: Gastroenterology;  Laterality: N/A;   EYE SURGERY Bilateral    Cataract   FOREIGN BODY REMOVAL Left 05/13/2018   Procedure: FOREIGN BODY REMOVAL left foot;  Surgeon: Addie Cordella Hamilton, MD;  Location: [facility] OR;  Service: Orthopedics;  Laterality: Left;   I & D EXTREMITY Left 05/21/2018   Procedure: LEFT FOOT DEBRIDEMENT AND WOUND CLOSURE;  Surgeon: Harden Jerona GAILS, MD;  Location: [facility] OR;  Service: Orthopedics;  Laterality: Left;   LEFT HEART CATH AND CORONARY ANGIOGRAPHY N/A 07/12/2021   Procedure: LEFT HEART CATH AND CORONARY ANGIOGRAPHY;  Surgeon: Dann Candyce RAMAN, MD;  Location: [facility] INVASIVE CV LAB;  Service: Cardiovascular;  Laterality: N/A;   LIGATION OF ARTERIOVENOUS  FISTULA Left 01/15/2024   Procedure: LIGATION OF ARTERIOVENOUS  FISTULA;  Surgeon: Sheree Penne Bruckner, MD;  Location: [facility] OR;  Service: Vascular;  Laterality: Left;   LOWER EXTREMITY ANGIOGRAPHY N/A 06/06/2024   Procedure: Lower Extremity Angiography;  Surgeon: Sheree Penne Bruckner, MD;  Location: [facility] INVASIVE CV LAB;  Service: Cardiovascular;  Laterality: N/A;   PERIPHERAL VASCULAR BALLOON ANGIOPLASTY  11/30/2023   Procedure: PERIPHERAL VASCULAR BALLOON ANGIOPLASTY;  Surgeon: Sheree Penne Bruckner, MD;  Location: [facility] INVASIVE CV LAB;  Service: Cardiovascular;;  left AT   PERIPHERAL VASCULAR INTERVENTION  11/30/2023   Procedure: PERIPHERAL VASCULAR INTERVENTION;  Surgeon: Sheree Penne Bruckner, MD;  Location:  [facility] INVASIVE CV LAB;  Service: Cardiovascular;;  stent left AT   POLYPECTOMY  07/24/2020   Procedure: POLYPECTOMY;  Surgeon: Legrand Victory LITTIE DOUGLAS, MD;  Location: [facility] ENDOSCOPY;  Service: Gastroenterology;;   SKIN DEBRIDEMENT Left 03/03/2024   Procedure: DEBRIDEMENT, SKIN;  Surgeon: Murrell Drivers, MD;  Location: [facility] OR;  Service: Orthopedics;  Laterality: Left;   TEE WITHOUT CARDIOVERSION N/A 01/02/2020   Procedure: TRANSESOPHAGEAL ECHOCARDIOGRAM (TEE);  Surgeon: Loni Soyla LABOR, MD;  Location: [facility] ENDOSCOPY;  Service: Cardiology;  Laterality: N/A;   TONSILLECTOMY     TUBAL LIGATION     UPPER EXTREMITY ANGIOGRAPHY Left 11/30/2023   Procedure: Upper Extremity Angiography;  Surgeon: Sheree Penne Bruckner, MD;  Location: [facility] INVASIVE CV LAB;  Service: Cardiovascular;  Laterality: Left;    No Known Allergies  Prior to Admission medications   Medication Sig Start Date End Date Taking? Authorizing Provider  acetaminophen  (TYLENOL  8 HOUR) 650 MG CR tablet Take 1 tablet (650 mg total) by mouth every 6 (six) hours as needed for pain or fever. 01/04/24   Charlyn Sora, MD  aspirin  EC 81 MG tablet Take 81  mg by mouth daily. Swallow whole.    [provider]  atorvastatin  (LIPITOR ) 80 MG tablet Take 80 mg by mouth at bedtime.    [provider]  calcitRIOL  (ROCALTROL ) 0.25 MCG capsule Take 0.25 mcg by mouth daily.    [provider]  clopidogrel  (PLAVIX ) 75 MG tablet Take 1 tablet (75 mg total) by mouth daily. 01/06/24 01/05/25  Rhyne, Samantha J, PA-C  collagenase  (SANTYL ) 250 UNIT/GM ointment Apply 1-2 grams to wound qd 07/06/24   Christine Rush, DPM  gabapentin  (NEURONTIN ) 100 MG capsule Take 100 mg by mouth 2 (two) times daily. 11/02/23   [provider]  Insulin  Glargine Solostar (LANTUS ) 100 UNIT/ML Solostar Pen Inject 20 Units into the skin daily. 11/11/22   [provider]  linagliptin  (TRADJENTA ) 5 MG TABS tablet Take 1 tablet (5 mg total) by mouth daily. 06/26/23    Thapa, Sudan, MD  metoCLOPramide  (REGLAN ) 5 MG tablet Take 5 mg by mouth Every Tuesday,Thursday,and Saturday with dialysis.    [provider]  midodrine  (PROAMATINE ) 10 MG tablet Take 10 mg by mouth 3 (three) times daily. 05/09/24   [provider]  ondansetron  (ZOFRAN -ODT) 4 MG disintegrating tablet Take 1 tablet (4 mg total) by mouth every 8 (eight) hours as needed for nausea or vomiting. 02/12/24   Alba Sharper, MD  potassium chloride  SA (KLOR-CON  M) 20 MEQ tablet Take 1 tablet (20 mEq total) by mouth 2 (two) times daily. 03/02/24   Cleotilde Perkins, DO  triamcinolone  lotion (KENALOG ) 0.1 % Apply 1 Application topically 2 (two) times daily as needed (irritation).    [provider]    Social History   Socioeconomic History   Marital status: Divorced    Spouse name: Not on file   Number of children: Not on file   Years of education: Not on file   Highest education level: Not on file  Occupational History   Occupation: Retired  Tobacco Use   Smoking status: Former    Current packs/day: 0.00    Types: Cigarettes    Start date: 01/19/1980    Quit date: 01/19/1984    Years since quitting: 40.6    Passive exposure: Never   Smokeless tobacco: Never  Vaping Use   Vaping status: Never Used  Substance and Sexual Activity   Alcohol  use: No   Drug use: No   Sexual activity: Not Currently    Birth control/protection: Post-menopausal  Other Topics Concern   Not on file  Social History Narrative   Lives in [place] with her dtr.  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  Recently moved from disability to retired when she turned 2.  Sedentary.  Knits frequently for fun.   Social Drivers of Corporate Investment Banker Strain: Not on file  Food Insecurity: Low Risk  (03/11/2024)   Received from [facility]   Hunger Vital Sign    Within the past 12 months, you worried that your food would run out before you got money to buy more:  Never true    Within the past 12 months, the food you bought just didn't last and you didn't have money to get more. : Never true  Transportation Needs: No Transportation Needs (03/11/2024)   Received from [facility]    In the past 12 months, has lack of reliable transportation kept you from medical appointments, meetings, work or from getting things needed for daily living? : No  Physical Activity: Unknown (08/06/2019)   Exercise Vital Sign    Days of Exercise per Week: Patient declined    Minutes of Exercise per Session: Patient declined  Stress: No Stress Concern Present (08/06/2019)   [employer] of Occupational Health - Occupational Stress Questionnaire    Feeling of Stress : Only a little  Social Connections: Socially Isolated (02/29/2024)   Social Connection and Isolation Panel    Frequency of Communication with Friends and Family: More than three times a week    Frequency of Social Gatherings with Friends and Family: Once a week    Attends Religious Services: Never    Database Administrator or Organizations: No    Attends Banker Meetings: Never    Marital Status: Divorced  Catering Manager Violence: Not At Risk (02/29/2024)   Humiliation, Afraid, Rape, and Kick questionnaire    Fear of Current or Ex-Partner: No    Emotionally Abused: No    Physically Abused: No    Sexually Abused: No     Family History  Problem Relation Age of Onset   Hypertension Mother    Diabetes Mother    Hypertension Sister     ROS: [x]  Positive   [ ]  Negative   [ ]  All sytems reviewed and are negative  Cardiac: [x]  CHF  [x]  [name] Vascular: []  pain in legs while walking []  pain in legs at rest []  pain in legs at night [x]  non-healing ulcers []  hx of DVT []  swelling in legs  Pulmonary: []  asthma/wheezing []  home O2  Neurologic: []  hx of CVA []  mini stroke   Hematologic: []  hx of cancer  Endocrine:   [x]  diabetes []  thyroid disease  GI []   GERD  GU: [x]  CKD/renal failure [x]  PD  Psychiatric: []  anxiety []  depression  Musculoskeletal: []  arthritis []  joint pain  Integumentary: [x]  ulcers  Constitutional: [x]  N/V  Physical Examination  Vitals:   08/29/24 0000 08/29/24 0752  BP: 113/65 97/67  Pulse: 99 95  Resp: 15 16  Temp: 97.8 F (36.6 C) 97.6 F (36.4 C)  SpO2: 95% 100%   Body mass index is 29.01 kg/m.  General:  WDWN in NAD Gait: Not observed HENT: WNL, normocephalic Pulmonary: normal non-labored breathing Cardiac: regular Abdomen:  soft, NT Skin: without rashes Vascular Exam/Pulses: Brisk bilateral AT doppler flow Palpable femoral pulses bilaterally Extremities:  Left 5th finger completely healed after ligation of fistula Left heel   Right foot   Musculoskeletal: no muscle wasting or atrophy  Neurologic: A&O X 3 Psychiatric:  The pt has flat affect.   CBC    Component Value Date/Time   WBC 13.9 (H) 08/29/2024 0455   RBC 4.11 08/29/2024 0455   HGB 11.8 (L) 08/29/2024 0455   HCT 35.7 (L) 08/29/2024 0455   PLT 338 08/29/2024 0455   MCV 86.9 08/29/2024 0455   MCH 28.7 08/29/2024 0455   MCHC 33.1 08/29/2024 0455   RDW 14.3 08/29/2024 0455   LYMPHSABS 2.2 08/27/2024 1853   MONOABS 0.8 08/27/2024 1853   EOSABS 0.2 08/27/2024 1853   BASOSABS 0.0 08/27/2024 1853    BMET    Component Value Date/Time   NA 133 (L) 08/29/2024 0455   K 4.4 08/29/2024 0455   CL 97 (L) 08/29/2024 0455   CO2 24 08/29/2024 0455   GLUCOSE 90 08/29/2024 0455   BUN 15 08/29/2024 0455   CREATININE 6.63 (H) 08/29/2024 0455   CALCIUM  8.3 (L) 08/29/2024 0455   GFRNONAA 6 (L)  08/29/2024 0455   GFRAA 8 (L) 07/24/2020 0510    COAGS: Lab Results  Component Value Date   INR 1.0 02/29/2024   INR 1.1 02/09/2024   INR 1.0 07/09/2021     Non-Invasive Vascular Imaging:   Will order ABI and BLE arterial duplex    ASSESSMENT/PLAN: This is a 73 y.o. female with ESRD on PD, DM, HTN and known to  vascular service for PAD and ESRD with bilateral foot wounds and vascular asked to evaluate.   -pt with hx of left ATA stenting 11/30/2023.  Her angiography at that time revealed inline flow to both feet via ATA.  Will get ABI and BLE arterial duplex.  -continue asa statin plavix . -Dr. Gretta to evaluate pt and determine further plan   Lucie Apt, PA-C [facility] [phone]  I have seen and evaluated the patient. I agree with the PA note as documented above.  73 year old female with multiple comorbidities including ESRD and diabetes well-known to vascular surgery that has had left AT stenting for CLI with tissue loss.  Last underwent angiogram in August showing bilateral lower extremity inline flow through the AT and optimized.  She states her left heel ulcer is worse.  She also has tissue loss on the right 2nd and 3rd toes.  Will get bilateral lower extremity arterial duplex to ensure her left AT stent remains patent and ensure there is no other flow-limiting stenosis.  Likely optimized from vascular surgery.  Podaitry following.  Lonni DOROTHA Gretta, MD [facility] [place] Office: [phone]

## 2024-08-29 NOTE — Evaluation (Signed)
 Physical Therapy Evaluation Patient Details Name: Melissa Howe MRN: [id number] DOB: Mar 29, 1951 Today's Date: 08/29/2024  History of Present Illness  Pt is a 73 y/o F admitted on 08/27/24 after presenting with c/o recurrent N&V, diarrhea x 2 days. Pt found to have hypokalemia & hypomagnesemia. PMH: ESRD on PD, DM, HTN, PVD, chronic foot wounds, anemia, CHF, [name], DM2, dyspnea, HLD, MI  Clinical Impression  Pt seen for PT evaluation with pt agreeable to tx. Pt reports prior to admission she was residing with daughter who works but has a PCA who stays almost every day, but assists her for 5 hours/day. Pt reports she requires 1 assist for stand pivot transfers to/from w/c with RW. On this date, pt is able to complete bed mobility with min assist, sit>stand with mod assist, stand pivot bed>recliner with RW & min assist. Pt presents with BLE weakness & would benefit from ongoing PT services to progress mobility as able.        If plan is discharge home, recommend the following: A lot of help with walking and/or transfers;A lot of help with bathing/dressing/bathroom;Assistance with cooking/housework;Help with stairs or ramp for entrance;Assist for transportation   Can travel by private vehicle        Equipment Recommendations None recommended by PT  Recommendations for Other Services       Functional Status Assessment Patient has had a recent decline in their functional status and demonstrates the ability to make significant improvements in function in a reasonable and predictable amount of time.     Precautions / Restrictions Precautions Precautions: Fall Restrictions Weight Bearing Restrictions Per Provider Order: No      Mobility  Bed Mobility Overal bed mobility: Needs Assistance Bed Mobility: Supine to Sit     Supine to sit: Min assist (exit R side of bed, uses bed rails, holds to PTs hand to upright trunk)          Transfers Overall transfer level: Needs  assistance Equipment used: Rolling walker (2 wheels) Transfers: Sit to/from Stand, Bed to chair/wheelchair/BSC Sit to Stand: Mod assist   Step pivot transfers: Min assist (bed>recliner with RW)       General transfer comment: sit>stand from slightly elevated EOB with PT blocking knees & assistance to power up to standing    Ambulation/Gait                  Stairs            Wheelchair Mobility     Tilt Bed    Modified Daniel (Stroke Patients Only)       Balance Overall balance assessment: Needs assistance Sitting-balance support: Feet supported Sitting balance-Leahy Scale: Fair     Standing balance support: Bilateral upper extremity supported, Reliant on assistive device for balance Standing balance-Leahy Scale: Poor                               Pertinent Vitals/Pain Pain Assessment Pain Assessment: No/denies pain    Home Living Family/patient expects to be discharged to:: Private residence Living Arrangements: Children (personal care aide) Available Help at Discharge: Family;Personal care attendant;Available 24 hours/day Type of Home: House Home Access: Ramped entrance       Home Layout: Two level;Able to live on main level with bedroom/bathroom Home Equipment: Rolling Walker (2 wheels);Wheelchair - manual      Prior Function  Mobility Comments: Independent with bed mobility, 1 assist for bed<>w/c transfers with RW, intermittently propels w/c with BUE, some use of BLE. ADLs Comments: Able to dress upper body, requires assistance for lower body dressing, daughter/PCA assists with sponge bathing & PD     Extremity/Trunk Assessment   Upper Extremity Assessment Upper Extremity Assessment: Overall WFL for tasks assessed    Lower Extremity Assessment Lower Extremity Assessment: Generalized weakness       Communication   Communication Communication: No apparent difficulties    Cognition Arousal:  Alert Behavior During Therapy: WFL for tasks assessed/performed   PT - Cognitive impairments: No apparent impairments                         Following commands: Intact       Cueing Cueing Techniques: Verbal cues     General Comments General comments (skin integrity, edema, etc.): max HR 135 bpm after transfer; PD tubing intact without issue at end of session    Exercises     Assessment/Plan    PT Assessment Patient needs continued PT services  PT Problem List Decreased strength;Decreased mobility;Decreased activity tolerance;Decreased balance;Decreased knowledge of use of DME       PT Treatment Interventions DME instruction;Therapeutic exercise;Balance training;Gait training;Functional mobility training;Cognitive remediation;Neuromuscular re-education;Therapeutic activities;Patient/family education    PT Goals (Current goals can be found in the Care Plan section)  Acute Rehab PT Goals Patient Stated Goal: none stated PT Goal Formulation: With patient Time For Goal Achievement: 09/12/24 Potential to Achieve Goals: Good    Frequency Min 2X/week     Co-evaluation               AM-PAC PT 6 Clicks Mobility  Outcome Measure Help needed turning from your back to your side while in a flat bed without using bedrails?: A Little Help needed moving from lying on your back to sitting on the side of a flat bed without using bedrails?: A Lot Help needed moving to and from a bed to a chair (including a wheelchair)?: A Little Help needed standing up from a chair using your arms (e.g., wheelchair or bedside chair)?: A Lot Help needed to walk in hospital room?: A Lot Help needed climbing 3-5 steps with a railing? : Total 6 Click Score: 13    End of Session   Activity Tolerance: Patient tolerated treatment well Patient left: in chair;with chair alarm set;with call bell/phone within reach Nurse Communication: Mobility status PT Visit Diagnosis: Muscle weakness  (generalized) (M62.81);Difficulty in walking, not elsewhere classified (R26.2)    Time: 1450-1511 PT Time Calculation (min) (ACUTE ONLY): 21 min   Charges:   PT Evaluation $PT Eval Low Complexity: 1 Low   PT General Charges $$ ACUTE PT VISIT: 1 Visit         Richerd Pinal, PT, DPT 08/29/24, 3:41 PM   Richerd CHRISTELLA Pinal 08/29/2024, 3:40 PM

## 2024-08-29 NOTE — Consult Note (Signed)
 PODIATRY CONSULTATION  NAME Melissa Howe MRN [id number] DOB Dec 08, 1950 DOA 08/27/2024   Reason for consult:  Chief Complaint  Patient presents with   Emesis   Headache    Attending/Consulting physician:   History of present illness: Melissa Howe is a 73 y.o. female with medical history significant of end-stage renal disease on peritoneal dialysis, diabetes, hypertension, peripheral vascular disease with chronic foot wounds.  She comes in to the [facility] complaining of recurrent nausea, vomiting, diarrhea over the last 2 days.  Her vomitus was nonbloody, nonbilious.  Patient denies fevers chills, abdominal pain. Patient has chronic wounds on her feet and she is followed by podiatry for this -Last seen on 10/28 with diagnosis of Full-thickness Wagner grade 2 ulceration posterior aspect heel left -somewhat larger.  At that time she was debrided, dressed with Silvadene cream and given a surgical shoe.  I also referred her to wound care clinic  Patient last seen by Dr. Christine on 08/23/2024.  Following up for left heel ulceration as well as wounds to the right 2nd and 3rd toes.  Noted that the ulceration was somewhat larger.  Patient does report pain in the left heel.  She says her right toes do not hurt.  He recommended soaking once a day lukewarm salt water 15 minutes apply Santyl  ointment and dressing.  Does have history of having angiogram with vascular surgery on 06/06/2024, noted to have single-vessel runoff into the right lower extremity with very diminished pedal flow secondary to small vessel disease.  Past Medical History:  Diagnosis Date   Anemia    CHF (congestive heart failure) (HCC)    Constipation    Coronary artery disease    Diabetes mellitus    Type II   Dyspnea    with exertion   ESRD (end stage renal disease) (HCC)    dialyzing 3 times a day with peritoneal dialysis catheter that was placed in 04/2023   History of blood transfusion     Hyperlipemia    Hypertension    Ischemic cardiomyopathy    Myocardial infarction [facility])    Peripheral vascular disease    Pneumonia 2019       Latest Ref Rng & Units 08/29/2024    4:55 AM 08/27/2024    7:08 PM 08/27/2024    6:53 PM  CBC  WBC 4.0 - 10.5 K/uL 13.9   12.4   Hemoglobin 12.0 - 15.0 g/dL 88.1  85.3  86.5   Hematocrit 36.0 - 46.0 % 35.7  43.0  40.8   Platelets 150 - 400 K/uL 338   404        Latest Ref Rng & Units 08/29/2024    4:55 AM 08/28/2024    3:56 PM 08/27/2024    7:08 PM  BMP  Glucose 70 - 99 mg/dL 90  832    BUN 8 - 23 mg/dL 15  13    Creatinine 9.55 - 1.00 mg/dL 3.36  3.78    Sodium 864 - 145 mmol/L 133  133  131   Potassium 3.5 - 5.1 mmol/L 4.4  2.9  2.1   Chloride 98 - 111 mmol/L 97  95    CO2 22 - 32 mmol/L 24  23    Calcium  8.9 - 10.3 mg/dL 8.3  8.3        Physical Exam: Lower Extremity Exam  Bilateral foot nonpalpable DP and PT pulses  Right foot with necrotic changes of the 2nd and 3rd toes relatively  stable without significant malodor or maceration, does have some cellulitis of the dorsal forefoot proximal to the toes.  On the left heel there is circular eschar consistent with decubitus ulceration that is tender to palpation no significant maceration relatively firm and stable at this time.         ASSESSMENT/PLAN OF CARE 73 y.o. female with PMHx significant for  end-stage renal disease on peritoneal dialysis, diabetes, hypertension, peripheral vascular disease  with gangrenous changes to the 2nd and 3rd toes on the right foot concern for possible underlying osteomyelitis as well as chronic left heel decubitus ulceration.  WBC 13.9 A1c 5.7 ESR/CRP pending  MRI right toes: Pending MRI left heel: Pending  -Recommend MRI of the right toes as well as the left heel to evaluate for possible osteomyelitis given leukocytosis.  Option will be for ongoing wound care with Betadine  paint versus amputation at some level in the right lower  extremity.  On the left heel this appears relatively stable however given her chronic pain should check for underlying infection.  Further recommendations pending MRI results -Discussed with vascular surgery PA  who agreed best for VVS to eval, appreciate recommendations - Continue IV abx broad spectrum pending further culture data - Anticoagulation: Okay to continue per primary - Wound care: Betadine  paint and dry gauze dressing applied to left heel Betadine  paint applied to the toes and should be left open to air - WB status: Weightbearing as tolerated bilateral lower extremity have recommended Prevalon boots while she is in bed and ordered these - Will continue to follow   Thank you for the consult.  Please contact me directly with any questions or concerns.           Melissa Howe, DPM [facility] / [facility]    [address] [place], [place] [address]                Office [phone]  Fax [phone]

## 2024-08-30 ENCOUNTER — Inpatient Hospital Stay (HOSPITAL_COMMUNITY)

## 2024-08-30 ENCOUNTER — Other Ambulatory Visit: Payer: Self-pay

## 2024-08-30 DIAGNOSIS — M62262 Nontraumatic ischemic infarction of muscle, left lower leg: Secondary | ICD-10-CM

## 2024-08-30 DIAGNOSIS — E876 Hypokalemia: Secondary | ICD-10-CM | POA: Diagnosis not present

## 2024-08-30 LAB — VAS US ABI WITH/WO TBI

## 2024-08-30 LAB — BASIC METABOLIC PANEL WITH GFR
Anion gap: 13 (ref 5–15)
BUN: 12 mg/dL (ref 8–23)
CO2: 24 mmol/L (ref 22–32)
Calcium: 8.6 mg/dL — ABNORMAL LOW (ref 8.9–10.3)
Chloride: 95 mmol/L — ABNORMAL LOW (ref 98–111)
Creatinine, Ser: 6.2 mg/dL — ABNORMAL HIGH (ref 0.44–1.00)
GFR, Estimated: 7 mL/min — ABNORMAL LOW (ref >60)
Glucose, Bld: 80 mg/dL (ref 70–99)
Potassium: 3 mmol/L — ABNORMAL LOW (ref 3.5–5.1)
Sodium: 132 mmol/L — ABNORMAL LOW (ref 135–145)

## 2024-08-30 LAB — GLUCOSE, CAPILLARY
Glucose-Capillary: 78 mg/dL (ref 70–99)
Glucose-Capillary: 105 mg/dL — ABNORMAL HIGH (ref 70–99)
Glucose-Capillary: 99 mg/dL (ref 70–99)
Glucose-Capillary: 119 mg/dL — ABNORMAL HIGH (ref 70–99)

## 2024-08-30 LAB — PATHOLOGIST SMEAR REVIEW

## 2024-08-30 LAB — CBC WITH DIFFERENTIAL/PLATELET
Abs Immature Granulocytes: 0.26 K/uL — ABNORMAL HIGH (ref 0.00–0.07)
Basophils Absolute: 0.1 K/uL (ref 0.0–0.1)
Basophils Relative: 0 %
Eosinophils Absolute: 0.4 K/uL (ref 0.0–0.5)
Eosinophils Relative: 2 %
HCT: 32.1 % — ABNORMAL LOW (ref 36.0–46.0)
Hemoglobin: 10.8 g/dL — ABNORMAL LOW (ref 12.0–15.0)
Immature Granulocytes: 2 %
Lymphocytes Relative: 22 %
Lymphs Abs: 3.5 K/uL (ref 0.7–4.0)
MCH: 29 pg (ref 26.0–34.0)
MCHC: 33.6 g/dL (ref 30.0–36.0)
MCV: 86.1 fL (ref 80.0–100.0)
Monocytes Absolute: 1.3 K/uL — ABNORMAL HIGH (ref 0.1–1.0)
Monocytes Relative: 8 %
Neutro Abs: 10.8 K/uL — ABNORMAL HIGH (ref 1.7–7.7)
Neutrophils Relative %: 66 %
Platelets: 343 K/uL (ref 150–400)
RBC: 3.73 MIL/uL — ABNORMAL LOW (ref 3.87–5.11)
RDW: 14.4 % (ref 11.5–15.5)
WBC: 16.3 K/uL — ABNORMAL HIGH (ref 4.0–10.5)
nRBC: 0.1 % (ref 0.0–0.2)

## 2024-08-30 LAB — MAGNESIUM: Magnesium: 2 mg/dL (ref 1.7–2.4)

## 2024-08-30 MED ORDER — POTASSIUM CHLORIDE CRYS ER 10 MEQ PO TBCR
40.0000 meq | EXTENDED_RELEASE_TABLET | Freq: Once | ORAL | Status: DC
  Filled 2024-08-30: qty 4

## 2024-08-30 MED ORDER — METOCLOPRAMIDE HCL 5 MG/ML IJ SOLN
5.0000 mg | Freq: Four times a day (QID) | INTRAMUSCULAR | Status: DC | PRN
  Filled 2024-08-30: qty 2

## 2024-08-30 MED ORDER — POTASSIUM CHLORIDE CRYS ER 10 MEQ PO TBCR
20.0000 meq | EXTENDED_RELEASE_TABLET | Freq: Two times a day (BID) | ORAL | Status: DC
  Filled 2024-08-30: qty 2

## 2024-08-30 MED ORDER — SODIUM CHLORIDE 0.9 % IV SOLN
100.0000 mg | Freq: Two times a day (BID) | INTRAVENOUS | Status: DC
  Administered 2024-08-31: 100 mg via INTRAVENOUS
  Filled 2024-08-30 (×2): qty 100

## 2024-08-30 MED ORDER — POTASSIUM CHLORIDE 20 MEQ PO PACK
40.0000 meq | PACK | Freq: Once | ORAL | Status: DC
  Filled 2024-08-30: qty 2

## 2024-08-30 MED ORDER — GENTAMICIN SULFATE 0.1 % EX CREA
1.0000 | TOPICAL_CREAM | Freq: Every day | CUTANEOUS | Status: DC
  Administered 2024-08-30: 1 via TOPICAL
  Filled 2024-08-30: qty 15

## 2024-08-30 MED ORDER — POTASSIUM CHLORIDE CRYS ER 20 MEQ PO TBCR: 20.0000 meq | EXTENDED_RELEASE_TABLET | Freq: Once | ORAL | Status: DC

## 2024-08-30 MED ORDER — DELFLEX-LC/1.5% DEXTROSE 344 MOSM/L IP SOLN: INTRAPERITONEAL | Status: DC

## 2024-08-30 MED ORDER — POTASSIUM CHLORIDE 10 MEQ/100ML IV SOLN
10.0000 meq | INTRAVENOUS | Status: AC
Start: 2024-08-30 — End: 2024-08-31
  Administered 2024-08-30 – 2024-08-31 (×4): 10 meq via INTRAVENOUS
  Filled 2024-08-30: qty 100

## 2024-08-30 NOTE — Plan of Care (Signed)

## 2024-08-30 NOTE — Progress Notes (Signed)
 [facility]  Subjective  -no complaints   Objective 114/77 100 98.6 F (37 C) (Oral) 17 95%  Intake/Output Summary (Last 24 hours) at 08/30/2024 0644 Last data filed at 08/29/2024 2245 Gross per 24 hour  Intake --  Output 328 ml  Net -328 ml         Laboratory Lab Results: Recent Labs    08/29/24 0455 08/30/24 0419  WBC 13.9* 16.3*  HGB 11.8* 10.8*  HCT 35.7* 32.1*  PLT 338 343   BMET Recent Labs    08/29/24 0455 08/30/24 0419  NA 133* 132*  K 4.4 3.0*  CL 97* 95*  CO2 24 24  GLUCOSE 90 80  BUN 15 12  CREATININE 6.63* 6.20*  CALCIUM  8.3* 8.6*    COAG Lab Results  Component Value Date   INR 1.0 02/29/2024   INR 1.1 02/09/2024   INR 1.0 07/09/2021   No results found for: PTT  Assessment/Planning:  73 year old female now with bilateral lower extremity worsening tissue loss.  Underwent angiogram in August 2025 with Dr. Sheree and optimized at that time with inline flow through the AT's bilaterally.  She does have a tibial stent on the left in the anterior tibial.  Suspect she is optimized from a vascular surgery standpoint.  Awaiting bilateral lower extremity arterial duplexes to ensure no other disease amendable intervention and that her left AT stent remains patent.  Melissa Howe 08/30/2024 6:44 AM --

## 2024-08-30 NOTE — Progress Notes (Signed)
 Attempt to initiate IV K but both IV sites kinked/occluded. IV team consulted and provider notified. Pt verbalizes that she does not want K because it burns and because she is tired. I explained to her that she needs it and consequences of hypokalemia. She said she understands but she is tired. When I asked her what that means, she said she is ready to go home to be with Jesus. Said she has mentioned this several times to her family but they keep telling her that they need her here with them but its not what she wants. I told her that I would put in a consult for her to speak with clergy and the MD and SW to set up a meeting with her family so that we can make sure her wishes are honored. Pt agreeable to POC.  I again told her that we needed to put an IV in so we can get the K to her, she said she doesn't want it. Provider placed order for PICC which I explained to her. She agreed but again expresses that does not want to keep doing this so she doesn't see the need for it.   Dr. Marcene updated on conversation with patient.

## 2024-08-30 NOTE — Progress Notes (Signed)
 Pt in vascular lab during rounds, ABI/PVR and Arterial duplex pending. MRI R toes and L heel also pending.  Will follow up tomorrow with further recommendations.         Marolyn JULIANNA Honour, DPM [facility] / [facility]                   08/30/2024

## 2024-08-30 NOTE — Progress Notes (Signed)
 Pt was unable to tolerate pills most of the day due to n/v. RN attempted to give AM meds twice, but was only able to give aspirin , plavix , and 2 tylenol  successfully. All meds, including the potassium were thrown up before within 30 min after being given. MAR updated.

## 2024-08-30 NOTE — Progress Notes (Signed)
 [facility] night cross cover note:   Patient requests goals of care conference with her family present.  I subsequently placed order for palliative care team consult for tomorrow morning for their assistance with conducting a goals of care clarification meeting with her family present.      Melissa Pore, DO Hospitalist

## 2024-08-30 NOTE — Progress Notes (Signed)
 PROGRESS NOTE                                                                                                                                                                                                             Patient Demographics:    Melissa Howe, is a 73 y.o. female, DOB - August 15, 1951, FMW:[id number]  Outpatient Primary MD for the patient is Claudene Prentice [name], FNP    LOS - 1  Admit date - 08/27/2024    Chief Complaint  Patient presents with   Emesis   Headache       Brief Narrative (HPI from H&P)    73 y.o. female with medical history significant of end-stage renal disease on peritoneal dialysis, diabetes, hypertension, peripheral vascular disease with chronic foot wounds.  She comes in to the [facility] complaining of recurrent nausea, vomiting, diarrhea over the last 2 days.  Her vomitus was nonbloody, nonbilious.  Patient denies fevers chills, abdominal pain. Patient has chronic wounds on her feet and she is followed by podiatry for this -Last seen on 10/28 with diagnosis of Full-thickness Wagner grade 2 ulceration posterior aspect heel left -somewhat larger.  At that time she was debrided, dressed with Silvadene cream and given a surgical shoe.  I also referred her to wound care clinic   Patient also follows with Dr. Sheree for peripheral vascular disease.  She had a angiogram on 8/11.  The findings of Aorta and iliac segments are calcified however there is no flow-limiting stenosis.     In the ER she was given antibiotics including Vanco and cefepime  for possible SBP.  CT head was done without abnormalities.  CT abdomen pelvis also done showing ascites, laboratory data was abnormal in regards to potassium, magnesium  and lactic acid   Subjective:   Patient in bed, appears comfortable, denies any headache, no fever, no chest pain or pressure, no shortness of breath , no abdominal pain. No focal weakness.    Assessment  & Plan :   N/V/D-leading to electrolyte abnormalities - To have been gastroenteritis, abdominal exam benign, CT in the setting of PD catheter unremarkable, she did get dehydrated, currently with supportive care no nausea vomiting or diarrhea, no abdominal pain, will continue to monitor.  Advance diet and monitor on PPI.  Symptoms have resolved.   Multiple areas of wounds  in both feet present on admission follows with podiatry outpatient. -Patient states her toes have looked like this for a while and does not feel there has been any change or sign of infection -WOC consult -has h/o PVD and had angiogram in 8/25 by Dr. Rollo not show any significant stenosis Currently on empiric antibiotic although I do not think she has significant cellulitis, follow cultures, podiatry consulted, they wanted vascular surgery to redo lower extremity angiogram which likely will be done by vascular surgery on 08/30/2024.   hypokalemia/hypomagnesemia  - Replaced   Chronic hypotension.  Continue midodrine   Chronic leukocytosis.  Monitor.  Elevated lactic acid  - Due to dehydration no clinical signs of sepsis   ESRD on PD  -Nephrology consultated   Right hip pain at the time of admission  - Stable x-ray, currently pain-free will monitor  DM2 -Sliding scale insulin    Lab Results  Component Value Date   HGBA1C 5.7 (H) 08/28/2024   CBG (last 3)  Recent Labs    08/29/24 1658 08/29/24 2036 08/30/24 0837  GLUCAP 225* 241* 78        Condition - Extremely Guarded  Family Communication  :   daughter crystal [phone]  on 08/29/24  Code Status :  Full  Consults  :  Renal, podiatry, vascular surgery  PUD Prophylaxis :  PPI   Procedures  :     CT head.  Nonacute.    CT abdomen pelvis. 1. Moderate pneumoperitoneum with moderate to large ascites; source not identified. Consider peritoneal dialysis as a potential cause. Recommend clinical correlation to exclude bowel gas. No abnormal  bowel visualized. 2. Peritoneal dialysis catheter present in the pelvis. 3. Extensive aortoiliac and branch vessel atherosclerosis      Disposition Plan  :    Status is: Inpatient   DVT Prophylaxis  :    heparin  injection 5,000 Units Start: 08/28/24 1630    Lab Results  Component Value Date   PLT 343 08/30/2024    Diet :  Diet Order             Diet full liquid Room service appropriate? No; Fluid consistency: Thin  Diet effective now                    Inpatient Medications  Scheduled Meds:  aspirin  EC  81 mg Oral Daily   atorvastatin   80 mg Oral QHS   calcitRIOL   0.25 mcg Oral Daily   clopidogrel   75 mg Oral Daily   collagenase    Topical Daily   doxycycline   100 mg Oral Q12H   gentamicin  cream  1 Application Topical Daily   heparin   5,000 Units Subcutaneous Q8H   insulin  aspart  0-5 Units Subcutaneous QHS   insulin  aspart  0-6 Units Subcutaneous TID WC   insulin  glargine-yfgn  8 Units Subcutaneous Daily   midodrine   10 mg Oral TID   pantoprazole   40 mg Oral Daily   potassium chloride   20 mEq Oral BID   potassium chloride   40 mEq Oral Once   Continuous Infusions:  dialysis solution 1.5% low-MG/low-CA     PRN Meds:.acetaminophen  **OR** acetaminophen , ondansetron  (ZOFRAN ) IV, prochlorperazine  Antibiotics  :    Anti-infectives (From admission, onward)    Start     Dose/Rate Route Frequency Ordered Stop   08/29/24 1015  doxycycline  (VIBRA -TABS) tablet 100 mg        100 mg Oral Every 12 hours 08/29/24 0918     08/27/24  2130  ceFEPIme  (MAXIPIME ) 2 g in sodium chloride  0.9 % 100 mL IVPB        2 g 200 mL/hr over 30 Minutes Intravenous  Once 08/27/24 2120 08/27/24 2214   08/27/24 2033  vancomycin  (VANCOCIN ) 750 MG injection       Note to Pharmacy: Sandie Stapler G: cabinet override      08/27/24 2033 08/28/24 0844   08/27/24 2020  vancomycin  (VANCOCIN ) 750 MG injection  Status:  Discontinued       Note to Pharmacy: Sandie Stapler G: cabinet  override      08/27/24 2020 08/27/24 2023   08/27/24 1945  cefTAZidime (FORTAZ) 2 g in sodium chloride  0.9 % 100 mL IVPB  Status:  Discontinued        2 g 200 mL/hr over 30 Minutes Intravenous  Once 08/27/24 1937 08/27/24 2116   08/27/24 1945  vancomycin  (VANCOCIN ) IVPB 1000 mg/200 mL premix       Placed in Followed by Linked Group   1,000 mg 200 mL/hr over 60 Minutes Intravenous  Once 08/27/24 1944 08/27/24 2112   08/27/24 1945  vancomycin  (VANCOREADY) IVPB 750 mg/150 mL       Placed in Followed by Linked Group   750 mg 150 mL/hr over 60 Minutes Intravenous  Once 08/27/24 1944 08/27/24 2131         Objective:   Vitals:   08/29/24 1730 08/29/24 1930 08/30/24 0500 08/30/24 0839  BP: (!) 89/57 114/77  (!) 87/57  Pulse: 100 100  (!) 106  Resp: 16 17  18   Temp: 98.6 F (37 C)   97.6 F (36.4 C)  TempSrc: Oral   Oral  SpO2: 99% 95%  99%  Weight:   72.5 kg   Height:        Wt Readings from Last 3 Encounters:  08/30/24 72.5 kg  06/06/24 76.7 kg  05/11/24 74.4 kg     Intake/Output Summary (Last 24 hours) at 08/30/2024 0920 Last data filed at 08/29/2024 2245 Gross per 24 hour  Intake --  Output 328 ml  Net -328 ml     Physical Exam  Awake Alert, No new F.N deficits, Normal affect [place].AT,PERRAL Supple Neck, No JVD,   Symmetrical Chest wall movement, Good air movement bilaterally, CTAB RRR,No Gallops,Rubs or new Murmurs,  +ve B.Sounds, Abd Soft, No tenderness,   No Cyanosis, Clubbing or edema       Data Review:    Recent Labs  Lab 08/27/24 1853 08/27/24 1908 08/29/24 0455 08/30/24 0419  WBC 12.4*  --  13.9* 16.3*  HGB 13.4 14.6 11.8* 10.8*  HCT 40.8 43.0 35.7* 32.1*  PLT 404*  --  338 343  MCV 87.0  --  86.9 86.1  MCH 28.6  --  28.7 29.0  MCHC 32.8  --  33.1 33.6  RDW 13.8  --  14.3 14.4  LYMPHSABS 2.2  --   --  3.5  MONOABS 0.8  --   --  1.3*  EOSABS 0.2  --   --  0.4  BASOSABS 0.0  --   --  0.1    Recent Labs  Lab 08/27/24 1851  08/27/24 1853 08/27/24 1908 08/27/24 2101 08/28/24 0029 08/28/24 0229 08/28/24 1556 08/29/24 0455 08/29/24 1127 08/30/24 0419  NA  --  132* 131*  --   --   --  133* 133*  --  132*  K  --  2.3* 2.1*  --   --   --  2.9* 4.4  --  3.0*  CL  --  90*  --   --   --   --  95* 97*  --  95*  CO2  --  26  --   --   --   --  23 24  --  24  ANIONGAP  --  17*  --   --   --   --  15 12  --  13  GLUCOSE  --  253*  --   --   --   --  167* 90  --  80  BUN  --  13  --   --   --   --  13 15  --  12  CREATININE  --  6.31*  --   --   --   --  6.21* 6.63*  --  6.20*  AST  --  22  --   --   --   --   --  24  --   --   ALT  --  11  --   --   --   --   --  16  --   --   ALKPHOS  --  229*  --   --   --   --   --  138*  --   --   BILITOT  --  0.5  --   --   --   --   --  0.9  --   --   ALBUMIN   --  2.2*  --   --   --   --   --  <1.5*  --   --   CRP  --   --   --   --   --   --   --   --  0.9  --   LATICACIDVEN 5.6*  --   --  5.7* 5.0* 4.8* 4.7*  --   --   --   HGBA1C  --   --   --   --   --   --  5.7*  --   --   --   AMMONIA  --  13  --   --   --   --   --   --   --   --   MG  --   --   --  1.2*  --   --  1.3* 1.4*  --  2.0  CALCIUM   --  9.2  --   --   --   --  8.3* 8.3*  --  8.6*      Recent Labs  Lab 08/27/24 1851 08/27/24 1853 08/27/24 2101 08/28/24 0029 08/28/24 0229 08/28/24 1556 08/29/24 0455 08/29/24 1127 08/30/24 0419  CRP  --   --   --   --   --   --   --  0.9  --   LATICACIDVEN 5.6*  --  5.7* 5.0* 4.8* 4.7*  --   --   --   HGBA1C  --   --   --   --   --  5.7*  --   --   --   AMMONIA  --  13  --   --   --   --   --   --   --   MG  --   --  1.2*  --   --  1.3* 1.4*  --  2.0  CALCIUM   --  9.2  --   --   --  8.3* 8.3*  --  8.6*    --------------------------------------------------------------------------------------------------------------- Lab Results  Component Value Date   CHOL 99 02/10/2024   HDL 23 (L) 02/10/2024   LDLCALC 55 02/10/2024   TRIG 105 02/10/2024   CHOLHDL 4.3  02/10/2024    Lab Results  Component Value Date   HGBA1C 5.7 (H) 08/28/2024      Micro Results Recent Results (from the past 240 hours)  Blood culture (routine x 2)     Status: None (Preliminary result)   Collection Time: 08/27/24  6:51 PM   Specimen: BLOOD  Result Value Ref Range Status   Specimen Description   Final    BLOOD RIGHT ANTECUBITAL Performed at [facility], [address], [place], [place] [address]    Special Requests   Final    Blood Culture adequate volume Performed at [facility], [address] [place], [place] [address]    Culture   Final    NO GROWTH 3 DAYS Performed at [facility], [address] [place], [place] [address]    Report Status PENDING  Incomplete  Body fluid culture w Gram Stain     Status: None (Preliminary result)   Collection Time: 08/27/24  7:43 PM   Specimen: Peritoneal Cavity; Peritoneal Fluid  Result Value Ref Range Status   Specimen Description   Final    PERITONEAL CAVITY Performed at [facility], [address], [place], [place] [address]    Special Requests   Final    NONE Performed at [facility], [address] [place], [place] [address]    Gram Stain NO WBC SEEN NO ORGANISMS SEEN   Final   Culture   Final    NO GROWTH 2 DAYS Performed at [facility], [address] [place], [place] [address]    Report Status PENDING  Incomplete  Resp panel by RT-PCR (RSV, Flu A&B, Covid) Peripheral     Status: None   Collection Time: 08/27/24  7:43 PM   Specimen: Peripheral; Nasal Swab  Result Value Ref Range Status   SARS Coronavirus 2 by RT PCR NEGATIVE NEGATIVE Final    Comment: (NOTE) SARS-CoV-2 target nucleic acids are NOT DETECTED.  The SARS-CoV-2 RNA is generally detectable in upper respiratory specimens during the acute phase of infection. The lowest concentration of SARS-CoV-2 viral copies this assay can detect is 138  copies/mL. A negative result does not preclude SARS-Cov-2 infection and should not be used as the sole basis for treatment or other patient management decisions. A negative result may occur with  improper specimen collection/handling, submission of specimen other than nasopharyngeal swab, presence of viral mutation(s) within the areas targeted by this assay, and inadequate number of viral copies(<138 copies/mL). A negative result must be combined with clinical observations, patient history, and epidemiological information. The expected result is Negative.  Fact Sheet for Patients:  bloggercourse.com  Fact Sheet for Healthcare Providers:  seriousbroker.it  This test is no t yet approved or cleared by the [place]  FDA and  has been authorized for detection and/or diagnosis of SARS-CoV-2 by FDA under an Emergency Use Authorization (EUA). This EUA will remain  in effect (meaning this test can be used) for the duration of the COVID-19 declaration under Section 564(b)(1) of the Act, 21 U.S.C.section 360bbb-3(b)(1), unless the authorization is terminated  or revoked sooner.       Influenza A by PCR NEGATIVE  NEGATIVE Final   Influenza B by PCR NEGATIVE NEGATIVE Final    Comment: (NOTE) The Xpert Xpress SARS-CoV-2/FLU/RSV plus assay is intended as an aid in the diagnosis of influenza from Nasopharyngeal swab specimens and should not be used as a sole basis for treatment. Nasal washings and aspirates are unacceptable for Xpert Xpress SARS-CoV-2/FLU/RSV testing.  Fact Sheet for Patients: bloggercourse.com  Fact Sheet for Healthcare Providers: seriousbroker.it  This test is not yet approved or cleared by the [place]  FDA and has been authorized for detection and/or diagnosis of SARS-CoV-2 by FDA under an Emergency Use Authorization (EUA). This EUA will remain in effect (meaning  this test can be used) for the duration of the COVID-19 declaration under Section 564(b)(1) of the Act, 21 U.S.C. section 360bbb-3(b)(1), unless the authorization is terminated or revoked.     Resp Syncytial Virus by PCR NEGATIVE NEGATIVE Final    Comment: (NOTE) Fact Sheet for Patients: bloggercourse.com  Fact Sheet for Healthcare Providers: seriousbroker.it  This test is not yet approved or cleared by the [place]  FDA and has been authorized for detection and/or diagnosis of SARS-CoV-2 by FDA under an Emergency Use Authorization (EUA). This EUA will remain in effect (meaning this test can be used) for the duration of the COVID-19 declaration under Section 564(b)(1) of the Act, 21 U.S.C. section 360bbb-3(b)(1), unless the authorization is terminated or revoked.  Performed at [facility], [address], [place], [place] [address]   Blood culture (routine x 2)     Status: None (Preliminary result)   Collection Time: 08/27/24  7:50 PM   Specimen: BLOOD RIGHT HAND  Result Value Ref Range Status   Specimen Description   Final    BLOOD RIGHT HAND Performed at [facility], [address] [place], [place] [address]    Special Requests   Final    Blood Culture adequate volume Performed at [facility], [address] [place], [place] [address]    Culture   Final    NO GROWTH 3 DAYS Performed at [facility], [address] [place], [place] [address]    Report Status PENDING  Incomplete    Radiology Report DG Foot 2 Views Right Result Date: 08/28/2024 CLINICAL DATA:  Osteomyelitis EXAM: RIGHT FOOT - 2 VIEW COMPARISON:  None Available. FINDINGS: There is no evidence of fracture or dislocation. Diffuse degenerative changes of the foot. Dorsal and plantar calcaneal spur. Vascular calcifications. No cortical erosions. Soft tissues are unremarkable. IMPRESSION: 1. No  radiographic evidence of osteomyelitis. 2. Diffuse degenerative changes of the foot. Electronically Signed   By: Limin  Xu M.D.   On: 08/28/2024 17:46   DG HIP UNILAT WITH PELVIS 2-3 VIEWS RIGHT Result Date: 08/28/2024 CLINICAL DATA:  Right hip pain EXAM: DG HIP (WITH OR WITHOUT PELVIS) 3V RIGHT COMPARISON:  CT abdomen and pelvis dated 08/27/2024 FINDINGS: There is no evidence of hip fracture or dislocation. Mild degenerative changes of bilateral hips. Vascular calcifications. Partially imaged peritoneal catheter is coiled in the left hemipelvis. IMPRESSION: Mild degenerative changes of bilateral hips. No acute fracture or dislocation. Electronically Signed   By: Limin  Xu M.D.   On: 08/28/2024 17:39     Signature  -   Lavada Stank M.D on 08/30/2024 at 9:20 AM   -  To page go to [email or web address]

## 2024-08-30 NOTE — Progress Notes (Addendum)
 [facility] night cross cover note:   I was notified by the patient's RN that the patient continues to experience nausea/vomiting refractory to existing Zofran  and Compazine, noting that the patient is currently unable to tolerate her oral medications.  I subsequently added prn IV Reglan  for nausea/vomiting refractory to Zofran  and Compazine, and changed her scheduled oral doxycycline  to IV route of administration.   Update: sole existing PIV was lost. Patient with poor venous access. IV team was consulted and were able to re-establish iv access with 22 gauge piv to the right hand. I subsequently placed orders for evaluation for PICC placement tomorrow AM.      Eva Pore, DO Hospitalist

## 2024-08-30 NOTE — Progress Notes (Signed)
   08/30/24 1747  Cycler Setup  Total Number of Night Cycles 5  Night Fill Volume 2500  Dianeal  Solution Dextrose  1.5% in 6000 mL Low Cal/Low Mag  Night Dwell Time per Cycle - Hour(s) 2  Night Dwell Time per Cycle - Minute(s) 0  Night Time Therapy - Minute(s) 58  Night Time Therapy - Hour(s) 12  Minimum Initial Drain Volume 0  Maximum Peritoneal Volume 3750  Night/Total Therapy Volume 87499  Day Exchange No  Hand-off documentation  Hand-off Given Given to shift RN/LPN  Report given to (Full Name) Zebedee Mace RN  Hand-off Received Received from shift RN/LPN  Report received from (Full Name) Therisa Che RN   PD tx initation note:   Pre TX VS: 97.8,114/54,(72),95, 15, Spo2 97 RA  Pre TX weight: 71.6 kg  PD treatment initiated via aseptic technique. Consent signed and in chart. Patient is alert and oriented. No complaints of pain. No specimen collected. PD exit site clean, dry and intact. Gentamycin and new dressing applied. Bedside RN educated on PD machine and how to contact tech support when PD machine alarms.

## 2024-08-30 NOTE — Progress Notes (Signed)
 [facility] Progress Note  Name: Melissa Howe MRN: [id number] DOB: 07/31/1951  Chief Complaint:  Nausea/vomiting and headache  Subjective:  Got PD yesterday - she just had 328 mL UF over 11/3's treatment per charting.  (Had just had 2.5 liters taken from her abdomen when she was drained to be put on.)  off the floor for a test earlier today when I came by.  she states that she feels tired and weak.  She was just put on PD again for tonight.    UF hx:  11/3 - day tx - 328 mL (as well as 2.5 liters out when she was initially put on)   Review of systems:    Nausea recently with vomiting Hx of diarrhea She denies shortness of breath or chest pain  ---------------- Background on consult:  The patient is a 73 y.o. year-old w/ PMH of DM, ESRD on PD,HTN, PAD w/ chronic foot wounds presented 11/01 last night c/o nausea/ vomiting and HA for 3 wks. Then started have AMS prior to coming to ED. No f/c/s. In ED BP 110/76, HR 93, RR 20, temp 97.6, 100% sats on RA. Labs showed K+ 2.3, BUN 13, creat 6.31, CA 9.2, alb 2.2, Mg 1.2, NH3 13, tbili 0.5. LA 5.6, 5.7, 4.8. WBC 12K, Hb 13. Abd CT showed moderate pneumoperitoneum with moderate to large ascites. Consider peritoneal dialysis as potential cause of air and liquid. Pt was vanc/ cefepime  IV abx. Her foot showed gangrenous toe changes and a heel wound. Pt rec'd K+ supplementation. GI pathogen panel was ordered and Cdif. Clear liquid was ordered. Pt was admitted. We are asked to see for ESRD.  Pt seen in room. No c/o's at this time. States that they were in an Urgent Care place for about 24 hrs prior to being sent to ED Sat evening. While at the Urgent Care her daughter brought some PD fluid and did an exchange at the Urgent Care. There are PD fluids in the labs for cell count and culture, they are labeled 11/01 and they were collected by the daughter and the staff at the Urgent Care apparently as far as I can tell from the pt's history.        Intake/Output Summary (Last 24 hours) at 08/30/2024 1109 Last data filed at 08/29/2024 2245 Gross per 24 hour  Intake --  Output 328 ml  Net -328 ml    Vitals:  Vitals:   08/29/24 1730 08/29/24 1930 08/30/24 0500 08/30/24 0839  BP: (!) 89/57 114/77  (!) 87/57  Pulse: 100 100  (!) 106  Resp: 16 17  18   Temp: 98.6 F (37 C)   97.6 F (36.4 C)  TempSrc: Oral   Oral  SpO2: 99% 95%  99%  Weight:   72.5 kg   Height:         Physical Exam:    General elderly in bed in no acute distress HEENT normocephalic atraumatic extraocular movements intact sclera anicteric Neck supple trachea midline Lungs clear to auscultation bilaterally normal work of breathing at rest  Heart S1S2 no rub Abdomen soft nontender nondistended; obese habitus Extremities no edema appreciated Psych normal mood and affect Neuro alert and oriented x3 provides hx and follows commands  Access: PD catheter in place and in use; clean and nontender  Medications reviewed   Labs:     Latest Ref Rng & Units 08/30/2024    4:19 AM 08/29/2024    4:55 AM 08/28/2024  3:56 PM  BMP  Glucose 70 - 99 mg/dL 80  90  832   BUN 8 - 23 mg/dL 12  15  13    Creatinine 0.44 - 1.00 mg/dL 3.79  3.36  3.78   Sodium 135 - 145 mmol/L 132  133  133   Potassium 3.5 - 5.1 mmol/L 3.0  4.4  2.9   Chloride 98 - 111 mmol/L 95  97  95   CO2 22 - 32 mmol/L 24  24  23    Calcium  8.9 - 10.3 mg/dL 8.6  8.3  8.3     OP PD: [facility] CAPD 7 days / wk 77kg   dwell vol 3000cc  3 exchanges/ day  8hr dwell time Last Hb 12.1 on 10/8 Last pth 177, Ca 9.3   Assessment/Plan:    N/V/D: with resultant hypokalemia.  Supportive care per primary team.  No signs of peritonitis (no abd pain, non-tender exam, TNC = 4 from PD fluid sent 11/1).  Lactic acidosis: note cx's sent, s/p abx; per primary team.  11/1 blood culture NGTD. No growth on peritoneal cultures and fluid not concerning  ESRD: on PD. Last full PD was likely 10/30 or 10/31. The daughter did  one exchange while they were waiting at the Urgent Care on Saturday prior to admission. She is on CAPD at home (3 exchanges each 8 hrs, has fluid in around the clock) We will transition to PD overnight while here.  Plan for 5 exchanges overnight   Ordered strict ins/outs    Hypokalemia/ hypomagnesemia:  Primary team considering possible GI infection Supplement potassium 40 meq today as ordered (20 mg BID is fine).  Mag is replete Volume: looks euvolemic on exam and may be dry per history.  Using all 1.5% fluids w/ hx n/v/d.  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  Anemia of esrd:  no indication for ESA here  Disposition - continue inpatient monitoring.        Melissa JAYSON Saba, MD 08/30/2024 7:49 PM

## 2024-08-31 DIAGNOSIS — E876 Hypokalemia: Secondary | ICD-10-CM | POA: Diagnosis not present

## 2024-08-31 LAB — CBC WITH DIFFERENTIAL/PLATELET
Abs Immature Granulocytes: 0.33 K/uL — ABNORMAL HIGH (ref 0.00–0.07)
Basophils Absolute: 0 K/uL (ref 0.0–0.1)
Basophils Relative: 0 %
Eosinophils Absolute: 0.1 K/uL (ref 0.0–0.5)
Eosinophils Relative: 0 %
HCT: 32.5 % — ABNORMAL LOW (ref 36.0–46.0)
Hemoglobin: 11.8 g/dL — ABNORMAL LOW (ref 12.0–15.0)
Immature Granulocytes: 2 %
Lymphocytes Relative: 11 %
Lymphs Abs: 2.4 K/uL (ref 0.7–4.0)
MCH: 31.1 pg (ref 26.0–34.0)
MCHC: 36.3 g/dL — ABNORMAL HIGH (ref 30.0–36.0)
MCV: 85.8 fL (ref 80.0–100.0)
Monocytes Absolute: 1.6 K/uL — ABNORMAL HIGH (ref 0.1–1.0)
Monocytes Relative: 7 %
Neutro Abs: 17 K/uL — ABNORMAL HIGH (ref 1.7–7.7)
Neutrophils Relative %: 80 %
Platelets: 663 K/uL — ABNORMAL HIGH (ref 150–400)
RBC: 3.79 MIL/uL — ABNORMAL LOW (ref 3.87–5.11)
RDW: 14.7 % (ref 11.5–15.5)
WBC: 21.4 K/uL — ABNORMAL HIGH (ref 4.0–10.5)
nRBC: 0.1 % (ref 0.0–0.2)

## 2024-08-31 LAB — BASIC METABOLIC PANEL WITH GFR
Anion gap: 15 (ref 5–15)
BUN: 15 mg/dL (ref 8–23)
CO2: 22 mmol/L (ref 22–32)
Calcium: 8.7 mg/dL — ABNORMAL LOW (ref 8.9–10.3)
Chloride: 96 mmol/L — ABNORMAL LOW (ref 98–111)
Creatinine, Ser: 6.26 mg/dL — ABNORMAL HIGH (ref 0.44–1.00)
GFR, Estimated: 7 mL/min — ABNORMAL LOW (ref >60)
Glucose, Bld: 127 mg/dL — ABNORMAL HIGH (ref 70–99)
Potassium: 4.1 mmol/L (ref 3.5–5.1)
Sodium: 133 mmol/L — ABNORMAL LOW (ref 135–145)

## 2024-08-31 LAB — BODY FLUID CULTURE W GRAM STAIN
Culture: NO GROWTH
Gram Stain: NONE SEEN

## 2024-08-31 LAB — PHOSPHORUS: Phosphorus: 2.7 mg/dL (ref 2.5–4.6)

## 2024-08-31 LAB — GLUCOSE, CAPILLARY: Glucose-Capillary: 147 mg/dL — ABNORMAL HIGH (ref 70–99)

## 2024-08-31 LAB — MAGNESIUM: Magnesium: 1.8 mg/dL (ref 1.7–2.4)

## 2024-08-31 MED ORDER — HALOPERIDOL 0.5 MG PO TABS: 0.5000 mg | ORAL_TABLET | ORAL | Status: DC | PRN

## 2024-08-31 MED ORDER — HALOPERIDOL LACTATE 5 MG/ML IJ SOLN: 0.5000 mg | INTRAMUSCULAR | Status: DC | PRN

## 2024-08-31 MED ORDER — GLYCOPYRROLATE 0.2 MG/ML IJ SOLN: 0.2000 mg | INTRAMUSCULAR | Status: DC | PRN

## 2024-08-31 MED ORDER — LORAZEPAM 2 MG/ML IJ SOLN: 1.0000 mg | INTRAMUSCULAR | Status: DC | PRN

## 2024-08-31 MED ORDER — GLYCOPYRROLATE 1 MG PO TABS: 1.0000 mg | ORAL_TABLET | ORAL | Status: DC | PRN

## 2024-08-31 MED ORDER — BIOTENE DRY MOUTH MT LIQD: 15.0000 mL | OROMUCOSAL | Status: DC | PRN

## 2024-08-31 MED ORDER — ONDANSETRON 4 MG PO TBDP: 4.0000 mg | ORAL_TABLET | Freq: Four times a day (QID) | ORAL | Status: DC | PRN

## 2024-08-31 MED ORDER — ENSURE PLUS HIGH PROTEIN PO LIQD
237.0000 mL | Freq: Two times a day (BID) | ORAL | Status: DC
  Administered 2024-09-01: 237 mL via ORAL

## 2024-08-31 MED ORDER — CYCLOBENZAPRINE HCL 5 MG PO TABS: 5.0000 mg | ORAL_TABLET | Freq: Once | ORAL | Status: DC | PRN

## 2024-08-31 MED ORDER — MORPHINE SULFATE (CONCENTRATE) 10 MG /0.5 ML PO SOLN: 5.0000 mg | ORAL | Status: DC | PRN

## 2024-08-31 MED ORDER — LORAZEPAM 1 MG PO TABS: 1.0000 mg | ORAL_TABLET | ORAL | Status: DC | PRN

## 2024-08-31 MED ORDER — ONDANSETRON HCL 4 MG/2ML IJ SOLN: 4.0000 mg | Freq: Four times a day (QID) | INTRAMUSCULAR | Status: DC | PRN

## 2024-08-31 MED ORDER — HALOPERIDOL LACTATE 2 MG/ML PO CONC: 0.5000 mg | ORAL | Status: DC | PRN

## 2024-08-31 MED ORDER — POLYVINYL ALCOHOL 1.4 % OP SOLN: 1.0000 [drp] | Freq: Four times a day (QID) | OPHTHALMIC | Status: DC | PRN

## 2024-08-31 MED ORDER — LORAZEPAM 2 MG/ML PO CONC: 1.0000 mg | ORAL | Status: DC | PRN

## 2024-08-31 NOTE — Progress Notes (Signed)
 PODIATRY PROGRESS NOTE Patient Name: Melissa Howe  DOB Mar 22, 1951 DOA 08/27/2024  Hospital Day: 5  Assessment:  72 y.o. female with PMHx significant for  end-stage renal disease on peritoneal dialysis, diabetes, hypertension, peripheral vascular disease  with gangrenous changes to the 2nd and 3rd toes on the right foot concern for possible underlying osteomyelitis as well as chronic left heel decubitus ulceration.   WBC 21.4 A1c 5.7 ESR 2, CRP 0.9   MRI right toes, MRI L heel:  1. Soft tissue wound over the posterior calcaneus. No osteomyelitis of the right forefoot or ankle. No drainable fluid collection to suggest an abscess. 2. Mild distal Achilles tendinosis. 3. Moderate plantar fasciitis of the medial band of plantar fascia and towards the calcaneal insertion.  Plan:  -No evidence of OM R 2nd/3rd toes or L calcaneus. Recommend continued local wound care as ordered. She remains at high risk for nonhealing of necrotic changes and any level of amputation due to vascular status  -Appreciate vascular surgery recs, optimized from vascular standpoint.  - Anticoagulation: per primary - Wound care: Betadine  paint and dry gauze dressing applied to left heel Betadine  paint applied to the toes and should be left open to air - WB status: Weightbearing as tolerated bilateral lower extremity have recommended Prevalon boots while she is in bed and ordered these - pt does not have on today. Will sign off, pt can follow up in 2 weeks from dc for ongoing wound care as well as with wound care center.         Marolyn JULIANNA Honour, DPM [facility]    Subjective:  Discussed MRI results with patient and plan for ongoing wound care. She agrees.   Objective:   Vitals:   08/31/24 0400 08/31/24 0800  BP: 116/68 99/65  Pulse: 88   Resp: 17 17  Temp: 97.8 F (36.6 C) 98 F (36.7 C)  SpO2: 98%        Latest Ref Rng & Units 08/31/2024    5:53 AM 08/30/2024    4:19 AM 08/29/2024     4:55 AM  CBC  WBC 4.0 - 10.5 K/uL 21.4  16.3  13.9   Hemoglobin 12.0 - 15.0 g/dL 88.1  89.1  88.1   Hematocrit 36.0 - 46.0 % 32.5  32.1  35.7   Platelets 150 - 400 K/uL 663  343  338        Latest Ref Rng & Units 08/31/2024    5:53 AM 08/30/2024    4:19 AM 08/29/2024    4:55 AM  BMP  Glucose 70 - 99 mg/dL 872  80  90   BUN 8 - 23 mg/dL 15  12  15    Creatinine 0.44 - 1.00 mg/dL 3.73  3.79  3.36   Sodium 135 - 145 mmol/L 133  132  133   Potassium 3.5 - 5.1 mmol/L 4.1  3.0  4.4   Chloride 98 - 111 mmol/L 96  95  97   CO2 22 - 32 mmol/L 22  24  24    Calcium  8.9 - 10.3 mg/dL 8.7  8.6  8.3     General: AAOx3, NAD  Lower Extremity Exam  Bilateral foot nonpalpable DP and PT pulses   Right foot with necrotic changes of the 2nd and 3rd toes appear stable without significant malodor or maceration, no change from prior   On the left heel there is circular eschar consistent with decubitus ulceration that is tender  to palpation no significant maceration relatively firm and stable at this time.   Radiology:  Results reviewed. See assessment for pertinent imaging results

## 2024-08-31 NOTE — Plan of Care (Signed)
 Spoke with primary team and later palliative care.  Palliative has seen the patient and she is moving to comfort care and stopping dialysis.  She is to be discharged with hospice.    Appreciate assistance of all staff and teams in the care of this patient.  We are sending a nurse to get her disconnected from PD   Nephrology will sign off.  Please do not hesitate to reach out if I can be of assistance with her care   Katheryn JAYSON Saba, MD 11:08 AM 08/31/2024

## 2024-08-31 NOTE — Progress Notes (Signed)
 Contacted [facility] home therapy dept and spoke to pt's PD RN. Staff advised that pt has decided to proceed with comfort measures and stop PD. Navigator will contact home therapy staff once pt d/c to home with hospice.   Randine Mungo Dialysis Navigator [phone]

## 2024-08-31 NOTE — Consult Note (Signed)
 Palliative Care Consult Note                                  Date: 08/31/2024   Patient Name: Melissa Howe  DOB:19-Jul-1951  FMW:[id number]  Age / Sex:73 y.o., female  PCP: Claudene Prentice [name], FNP Referring Physician: Dennise Lavada POUR, MD  Reason for Consultation: Establishing goals of care  Past Medical History:  Diagnosis Date   Anemia    CHF (congestive heart failure) (HCC)    Constipation    Coronary artery disease    Diabetes mellitus    Type II   Dyspnea    with exertion   ESRD (end stage renal disease) (HCC)    dialyzing 3 times a day with peritoneal dialysis catheter that was placed in 04/2023   History of blood transfusion    Hyperlipemia    Hypertension    Ischemic cardiomyopathy    Myocardial infarction [facility])    Peripheral vascular disease    Pneumonia 2019     Assessment & Plan:   HPI/Patient Profile: 73 y.o. female  with past medical history of ESRD on peritoneal dialysis, diabetes, HTN, PVD w/ chronic foot wounds admitted on 08/27/2024 with hypokalemia, hypomagnesemia 2/2 N/V/D. Palliative medicine consulted for goals of care conversation after patient expressed desire to end all interventions.   SUMMARY OF RECOMMENDATIONS   DNR-comfort Home with hospice - TOC order placed Discontinue peritoneal dialysis PRN medications for pain/dyspnea/secretions/nausea/agitation/anxiety  Symptom Management:  Morphine  SL/PO 5 mg Q2 PRN for dyspnea/pain Ativan 1 mg  PO/SL/IV Q4 for anxiety Robinul for 1 mg tablet Q4 for secretions Zofran  4 mg ODT for nausea Compazine 10 mg IV Q6 PRN Haldol 0.5 mg PO Q4 PRN for agitation  Code Status: DNR - Comfort  Prognosis:  < 2 weeks  Discharge Planning:  Home with Hospice   Discussed with: Dennise MD, Surles RN, patient, daughter Hydrographic Surveyor)  Subjective:   Reviewed medical records, received report from team, assessed the patient and then meet at the patient's bedside to  discuss diagnosis, prognosis, GOC, EOL wishes disposition and options.  Before meeting with the patient/family, I spent time reviewing the chart notes including H&P, podiatry, vascular, and nursing progress notes. I also reviewed vital signs, nursing flowsheets, medication administrations record, labs, and imaging.   I met with patient and daughter Hydrographic Surveyor) at bedside.   We meet to discuss diagnosis prognosis, GOC, EOL wishes, disposition and options. Concept of Palliative Care was introduced as specialized medical care for people and their families living with serious illness.  If focuses on providing relief from the symptoms and stress of a serious illness.  The goal is to improve quality of life for both the patient and the family. Values and goals of care important to patient and family were attempted to be elicited.  Created space and opportunity for patient  and family to explore thoughts and feelings regarding current medical situation   Natural trajectory and current clinical status were discussed. Questions and concerns addressed. Patient encouraged to call with questions or concerns.    Patient/Family Understanding of Illness: - Patient and family have a good understanding of the patient's ESRD, frequent hospitalization, PVD with chronic foot wounds have all contributed to a poor quality of life and are in pursuit of comfort measures going forward  Life Review: - Patient used to work in a warehouse - Patient enjoyed baking for herself and family -  Has a supportive and loving family who are supportive of whatever decision she makes - Has many grandchildren whom she is excited to see  Baseline Status: - Patient has had a decline in functional status over the last year with around 79 admissions/ED visits over the last 12 months - Is on peritoneal dialysis at home - Lives at home with 2 of her daughters who provide most of her care requiring a lot of assistance with ADLs  Today's  Discussion: - Discussed the patient's overall health in the last year and she expressed that she is tired of all the medical interventions and knows that her time left to live is short if she were to stop all these interventions and is at peace with the decision - Daughter Hydrographic Surveyor) is bedside and tearful but supportive of her mother's decision - Patient expressed her wish to go home with hospice and her daughter will do make sure she can honor her mother's wish - Patient has visitors that will need 1-2 days before they can arrive to visit, but are okay with shifting focus to comfort measures and stopping all current medical interventions to prolong her life  Goals: - To go home with hospice and meet her grandchildren when they visit  Review of Systems  Unable to perform ROS   Objective:   Primary Diagnoses: Present on Admission:  Hypokalemia   Vital Signs:  BP 99/65 (BP Location: Left Arm)   Pulse 88   Temp 98 F (36.7 C) (Oral)   Resp 17   Ht 5' 2.5 (1.588 m)   Wt 75.3 kg   SpO2 98%   BMI 29.88 kg/m   Physical Exam Constitutional:      Appearance: She is ill-appearing.     Comments: Alert, but slow in responding to questions.   HENT:     Head: Normocephalic.  Eyes:     Extraocular Movements: Extraocular movements intact.  Cardiovascular:     Rate and Rhythm: Normal rate.  Pulmonary:     Effort: Pulmonary effort is normal.  Neurological:     Mental Status: She is alert and oriented to person, place, and time.  Psychiatric:        Mood and Affect: Mood is depressed.    Palliative Assessment/Data: 40%   Thank you for allowing us  to participate in the care of Fareeda Downard Schoenherr PMT will continue to support holistically.  Billing based on MDM: High  Problems Addressed: One or more chronic illnesses with severe exacerbation, progression, or side effects of treatment.  Amount and/or Complexity of Data: Category 1:Review of prior external note(s) from each unique  source and Assessment requiring an independent historian(s) and Category 3:Discussion of management or test interpretation with external physician/other qualified health care professional/appropriate source (not separately reported)  Risks: Parenteral controlled substances, Drug therapy requiring intensive monitoring for toxicity, and Decision not to resuscitate or to de-escalate care because of poor prognosis   Signed by: Fairy FORBES Shan DEVONNA Palliative Medicine Team  Team Phone # [phone] (Nights/Weekends)  08/31/2024, 11:20 AM

## 2024-08-31 NOTE — Plan of Care (Signed)
   Problem: Clinical Measurements: Goal: Respiratory complications will improve Outcome: Progressing Goal: Cardiovascular complication will be avoided Outcome: Progressing
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
## 2024-08-31 NOTE — Progress Notes (Signed)
 [facility]  Subjective  -no complaints   Objective 99/65 88 98 F (36.7 C) (Oral) 17 98%  Intake/Output Summary (Last 24 hours) at 08/31/2024 0844 Last data filed at 08/30/2024 1800 Gross per 24 hour  Intake --  Output 500 ml  Net -500 ml         Laboratory Lab Results: Recent Labs    08/30/24 0419 08/31/24 0553  WBC 16.3* 21.4*  HGB 10.8* 11.8*  HCT 32.1* 32.5*  PLT 343 663*   BMET Recent Labs    08/30/24 0419 08/31/24 0553  NA 132* 133*  K 3.0* 4.1  CL 95* 96*  CO2 24 22  GLUCOSE 80 127*  BUN 12 15  CREATININE 6.20* 6.26*  CALCIUM  8.6* 8.7*    COAG Lab Results  Component Value Date   INR 1.0 02/29/2024   INR 1.1 02/09/2024   INR 1.0 07/09/2021   No results found for: PTT  Assessment/Planning:  Discussed duplex yesterday shows left AT stent remains patent with no other flow-limiting stenosis identified in the bilateral lower extremities.  Underwent recent angiogram on 06/06/2024 showing inline flow down both lower extremities through the AT.  Has small vessel disease.  Optimized from a vascular surgery standpoint.  Remains high risk for limb loss as we discussed and podiatry following for soft tissue management.  Lonni JINNY Gaskins 08/31/2024 8:44 AM --

## 2024-08-31 NOTE — Progress Notes (Addendum)
 PROGRESS NOTE                                                                                                                                                                                                             Patient Demographics:    Melissa Howe, is a 73 y.o. female, DOB - November 15, 1950, FMW:[id number]  Outpatient Primary MD for the patient is Claudene Prentice [name], FNP    LOS - 2  Admit date - 08/27/2024    Chief Complaint  Patient presents with   Emesis   Headache       Brief Narrative (HPI from H&P)    73 y.o. female with medical history significant of end-stage renal disease on peritoneal dialysis, diabetes, hypertension, peripheral vascular disease with chronic foot wounds.  She comes in to the [facility] complaining of recurrent nausea, vomiting, diarrhea over the last 2 days.  Her vomitus was nonbloody, nonbilious.  Patient denies fevers chills, abdominal pain. Patient has chronic wounds on her feet and she is followed by podiatry for this -Last seen on 10/28 with diagnosis of Full-thickness Wagner grade 2 ulceration posterior aspect heel left -somewhat larger.  At that time she was debrided, dressed with Silvadene cream and given a surgical shoe.  I also referred her to wound care clinic   Patient also follows with Dr. Sheree for peripheral vascular disease.  She had a angiogram on 8/11.  The findings of Aorta and iliac segments are calcified however there is no flow-limiting stenosis.     In the ER she was given antibiotics including Vanco and cefepime  for possible SBP.  CT head was done without abnormalities.  CT abdomen pelvis also done showing ascites, laboratory data was abnormal in regards to potassium, magnesium  and lactic acid   Subjective:   Patient in bed, appears comfortable, denies any headache, no fever, no chest pain or pressure, no shortness of breath , no abdominal pain. No new focal  weakness.   Assessment  & Plan :    Goals of care.  Patient now expresses her wish to stop PD and all aggressive treatment, says she has been suggesting to family for some time, I have discussed this with patient's daughter, palliative care has been consulted.  After family discussion palliative care will come up with goals of care.  N/V/D-leading to electrolyte abnormalities  - To have been gastroenteritis, abdominal exam benign, CT in the setting of PD catheter unremarkable, she did get dehydrated, currently with supportive care no nausea vomiting or diarrhea, no abdominal pain, will continue to monitor.  Advance diet and monitor on PPI.  Symptoms have resolved.   Multiple areas of wounds in both feet present on admission follows with podiatry outpatient. -Patient states her toes have looked like this for a while and does not feel there has been any change or sign of infection -WOC consult -has h/o PVD and had angiogram in 8/25 by Dr. Rollo not show any significant stenosis Currently on empiric antibiotic although I do not think she has significant cellulitis, follow cultures, podiatry consulted, they wanted vascular surgery to redo lower extremity angiogram which likely will be done by vascular surgery on 08/30/2024.   Hypokalemia/Hypomagnesemia  - Replaced   Chronic hypotension.  Continue midodrine   Chronic leukocytosis -  Monitor.  Elevated lactic acid  - Due to dehydration no clinical signs of sepsis   ESRD on PD  - Nephrology consultated   Right hip pain at the time of admission  - Stable x-ray, currently pain-free will monitor  DM2  - Sliding scale insulin    Lab Results  Component Value Date   HGBA1C 5.7 (H) 08/28/2024   CBG (last 3)  Recent Labs    08/30/24 1315 08/30/24 1606 08/30/24 1948  GLUCAP 105* 99 119*        Condition - Extremely Guarded  Family Communication  :   daughter crystal [phone]  on 08/29/24  Code Status :  Full  Consults  :   Renal, podiatry, vascular surgery  PUD Prophylaxis :  PPI   Procedures  :     CT head.  Nonacute.    CT abdomen pelvis. 1. Moderate pneumoperitoneum with moderate to large ascites; source not identified. Consider peritoneal dialysis as a potential cause. Recommend clinical correlation to exclude bowel gas. No abnormal bowel visualized. 2. Peritoneal dialysis catheter present in the pelvis. 3. Extensive aortoiliac and branch vessel atherosclerosis      Disposition Plan  :    Status is: Inpatient   DVT Prophylaxis  :    heparin  injection 5,000 Units Start: 08/28/24 1630    Lab Results  Component Value Date   PLT 663 (H) 08/31/2024    Diet :  Diet Order             Diet full liquid Room service appropriate? No; Fluid consistency: Thin  Diet effective now                    Inpatient Medications  Scheduled Meds:  aspirin  EC  81 mg Oral Daily   atorvastatin   80 mg Oral QHS   calcitRIOL   0.25 mcg Oral Daily   clopidogrel   75 mg Oral Daily   collagenase    Topical Daily   feeding supplement  237 mL Oral BID BM   gentamicin  cream  1 Application Topical Daily   gentamicin  cream  1 Application Topical Daily   heparin   5,000 Units Subcutaneous Q8H   insulin  aspart  0-5 Units Subcutaneous QHS   insulin  aspart  0-6 Units Subcutaneous TID WC   insulin  glargine-yfgn  8 Units Subcutaneous Daily   midodrine   10 mg Oral TID   pantoprazole   40 mg Oral Daily   potassium chloride   20 mEq Oral BID   Continuous Infusions:  dialysis solution 1.5% low-MG/low-CA  dialysis solution 1.5% low-MG/low-CA     doxycycline  (VIBRAMYCIN ) IV 100 mg (08/31/24 0020)   PRN Meds:.acetaminophen  **OR** acetaminophen , metoCLOPramide  (REGLAN ) injection, ondansetron  (ZOFRAN ) IV, prochlorperazine  Antibiotics  :    Anti-infectives (From admission, onward)    Start     Dose/Rate Route Frequency Ordered Stop   08/30/24 2200  doxycycline  (VIBRAMYCIN ) 100 mg in sodium chloride  0.9 % 250 mL IVPB         100 mg 125 mL/hr over 120 Minutes Intravenous Every 12 hours 08/30/24 2015     08/29/24 1015  doxycycline  (VIBRA -TABS) tablet 100 mg  Status:  Discontinued        100 mg Oral Every 12 hours 08/29/24 0918 08/30/24 2015   08/27/24 2130  ceFEPIme  (MAXIPIME ) 2 g in sodium chloride  0.9 % 100 mL IVPB        2 g 200 mL/hr over 30 Minutes Intravenous  Once 08/27/24 2120 08/27/24 2214   08/27/24 2033  vancomycin  (VANCOCIN ) 750 MG injection       Note to Pharmacy: Sandie Stapler G: cabinet override      08/27/24 2033 08/28/24 0844   08/27/24 2020  vancomycin  (VANCOCIN ) 750 MG injection  Status:  Discontinued       Note to Pharmacy: Sandie Stapler G: cabinet override      08/27/24 2020 08/27/24 2023   08/27/24 1945  cefTAZidime (FORTAZ) 2 g in sodium chloride  0.9 % 100 mL IVPB  Status:  Discontinued        2 g 200 mL/hr over 30 Minutes Intravenous  Once 08/27/24 1937 08/27/24 2116   08/27/24 1945  vancomycin  (VANCOCIN ) IVPB 1000 mg/200 mL premix       Placed in Followed by Linked Group   1,000 mg 200 mL/hr over 60 Minutes Intravenous  Once 08/27/24 1944 08/27/24 2112   08/27/24 1945  vancomycin  (VANCOREADY) IVPB 750 mg/150 mL       Placed in Followed by Linked Group   750 mg 150 mL/hr over 60 Minutes Intravenous  Once 08/27/24 1944 08/27/24 2131         Objective:   Vitals:   08/30/24 1600 08/30/24 2026 08/31/24 0022 08/31/24 0400  BP: (!) 114/54 (!) 106/57 (!) 97/49 116/68  Pulse: 93 99 94 88  Resp: 14 (!) 22 18 17   Temp: 97.9 F (36.6 C) 98.2 F (36.8 C) 98 F (36.7 C) 97.8 F (36.6 C)  TempSrc: Oral Oral Axillary Oral  SpO2: 99%  100% 98%  Weight:    75.3 kg  Height:        Wt Readings from Last 3 Encounters:  08/31/24 75.3 kg  06/06/24 76.7 kg  05/11/24 74.4 kg     Intake/Output Summary (Last 24 hours) at 08/31/2024 0745 Last data filed at 08/30/2024 1800 Gross per 24 hour  Intake --  Output 500 ml  Net -500 ml     Physical Exam  Awake Alert,  No new F.N deficits, Normal affect [place].AT,PERRAL Supple Neck, No JVD,   Symmetrical Chest wall movement, Good air movement bilaterally, CTAB RRR,No Gallops,Rubs or new Murmurs,  +ve B.Sounds, Abd Soft, No tenderness,   No Cyanosis, Clubbing or edema       Data Review:    Recent Labs  Lab 08/27/24 1853 08/27/24 1908 08/29/24 0455 08/30/24 0419 08/31/24 0553  WBC 12.4*  --  13.9* 16.3* 21.4*  HGB 13.4 14.6 11.8* 10.8* 11.8*  HCT 40.8 43.0 35.7* 32.1* 32.5*  PLT 404*  --  338 343  663*  MCV 87.0  --  86.9 86.1 85.8  MCH 28.6  --  28.7 29.0 31.1  MCHC 32.8  --  33.1 33.6 36.3*  RDW 13.8  --  14.3 14.4 14.7  LYMPHSABS 2.2  --   --  3.5 2.4  MONOABS 0.8  --   --  1.3* 1.6*  EOSABS 0.2  --   --  0.4 0.1  BASOSABS 0.0  --   --  0.1 0.0    Recent Labs  Lab 08/27/24 1851 08/27/24 1853 08/27/24 1853 08/27/24 1908 08/27/24 2101 08/28/24 0029 08/28/24 0229 08/28/24 1556 08/29/24 0455 08/29/24 1127 08/30/24 0419 08/31/24 0553  NA  --  132*   < > 131*  --   --   --  133* 133*  --  132* 133*  K  --  2.3*   < > 2.1*  --   --   --  2.9* 4.4  --  3.0* 4.1  CL  --  90*  --   --   --   --   --  95* 97*  --  95* 96*  CO2  --  26  --   --   --   --   --  23 24  --  24 22  ANIONGAP  --  17*  --   --   --   --   --  15 12  --  13 15  GLUCOSE  --  253*  --   --   --   --   --  167* 90  --  80 127*  BUN  --  13  --   --   --   --   --  13 15  --  12 15  CREATININE  --  6.31*  --   --   --   --   --  6.21* 6.63*  --  6.20* 6.26*  AST  --  22  --   --   --   --   --   --  24  --   --   --   ALT  --  11  --   --   --   --   --   --  16  --   --   --   ALKPHOS  --  229*  --   --   --   --   --   --  138*  --   --   --   BILITOT  --  0.5  --   --   --   --   --   --  0.9  --   --   --   ALBUMIN   --  2.2*  --   --   --   --   --   --  <1.5*  --   --   --   CRP  --   --   --   --   --   --   --   --   --  0.9  --   --   LATICACIDVEN 5.6*  --   --   --  5.7* 5.0* 4.8* 4.7*  --   --   --   --    HGBA1C  --   --   --   --   --   --   --  5.7*  --   --   --   --  AMMONIA  --  13  --   --   --   --   --   --   --   --   --   --   MG  --   --   --   --  1.2*  --   --  1.3* 1.4*  --  2.0 1.8  PHOS  --   --   --   --   --   --   --   --   --   --   --  2.7  CALCIUM   --  9.2  --   --   --   --   --  8.3* 8.3*  --  8.6* 8.7*   < > = values in this interval not displayed.      Recent Labs  Lab 08/27/24 1851 08/27/24 1853 08/27/24 2101 08/28/24 0029 08/28/24 0229 08/28/24 1556 08/29/24 0455 08/29/24 1127 08/30/24 0419 08/31/24 0553  CRP  --   --   --   --   --   --   --  0.9  --   --   LATICACIDVEN 5.6*  --  5.7* 5.0* 4.8* 4.7*  --   --   --   --   HGBA1C  --   --   --   --   --  5.7*  --   --   --   --   AMMONIA  --  13  --   --   --   --   --   --   --   --   MG  --   --  1.2*  --   --  1.3* 1.4*  --  2.0 1.8  CALCIUM   --  9.2  --   --   --  8.3* 8.3*  --  8.6* 8.7*    --------------------------------------------------------------------------------------------------------------- Lab Results  Component Value Date   CHOL 99 02/10/2024   HDL 23 (L) 02/10/2024   LDLCALC 55 02/10/2024   TRIG 105 02/10/2024   CHOLHDL 4.3 02/10/2024    Lab Results  Component Value Date   HGBA1C 5.7 (H) 08/28/2024      Micro Results Recent Results (from the past 240 hours)  Blood culture (routine x 2)     Status: None (Preliminary result)   Collection Time: 08/27/24  6:51 PM   Specimen: BLOOD  Result Value Ref Range Status   Specimen Description   Final    BLOOD RIGHT ANTECUBITAL Performed at [facility], [address], [place], [place] [address]    Special Requests   Final    Blood Culture adequate volume Performed at [facility], [address] [place], [place] [address]    Culture   Final    NO GROWTH 4 DAYS Performed at [facility], [address], [place], [place] [address]    Report Status PENDING  Incomplete  Body  fluid culture w Gram Stain     Status: None (Preliminary result)   Collection Time: 08/27/24  7:43 PM   Specimen: Peritoneal Cavity; Peritoneal Fluid  Result Value Ref Range Status   Specimen Description   Final    PERITONEAL CAVITY Performed at [facility], [address], [place], [place] [address]    Special Requests   Final    NONE Performed at [facility], [address], [place], [place] [address]    Gram Stain NO WBC SEEN  NO ORGANISMS SEEN   Final   Culture   Final    NO GROWTH 3 DAYS Performed at [facility], [address], [place], [place] [address]    Report Status PENDING  Incomplete  Resp panel by RT-PCR (RSV, Flu A&B, Covid) Peripheral     Status: None   Collection Time: 08/27/24  7:43 PM   Specimen: Peripheral; Nasal Swab  Result Value Ref Range Status   SARS Coronavirus 2 by RT PCR NEGATIVE NEGATIVE Final    Comment: (NOTE) SARS-CoV-2 target nucleic acids are NOT DETECTED.  The SARS-CoV-2 RNA is generally detectable in upper respiratory specimens during the acute phase of infection. The lowest concentration of SARS-CoV-2 viral copies this assay can detect is 138 copies/mL. A negative result does not preclude SARS-Cov-2 infection and should not be used as the sole basis for treatment or other patient management decisions. A negative result may occur with  improper specimen collection/handling, submission of specimen other than nasopharyngeal swab, presence of viral mutation(s) within the areas targeted by this assay, and inadequate number of viral copies(<138 copies/mL). A negative result must be combined with clinical observations, patient history, and epidemiological information. The expected result is Negative.  Fact Sheet for Patients:  bloggercourse.com  Fact Sheet for Healthcare Providers:  seriousbroker.it  This test is no t yet approved or cleared by  the [place]  FDA and  has been authorized for detection and/or diagnosis of SARS-CoV-2 by FDA under an Emergency Use Authorization (EUA). This EUA will remain  in effect (meaning this test can be used) for the duration of the COVID-19 declaration under Section 564(b)(1) of the Act, 21 U.S.C.section 360bbb-3(b)(1), unless the authorization is terminated  or revoked sooner.       Influenza A by PCR NEGATIVE NEGATIVE Final   Influenza B by PCR NEGATIVE NEGATIVE Final    Comment: (NOTE) The Xpert Xpress SARS-CoV-2/FLU/RSV plus assay is intended as an aid in the diagnosis of influenza from Nasopharyngeal swab specimens and should not be used as a sole basis for treatment. Nasal washings and aspirates are unacceptable for Xpert Xpress SARS-CoV-2/FLU/RSV testing.  Fact Sheet for Patients: bloggercourse.com  Fact Sheet for Healthcare Providers: seriousbroker.it  This test is not yet approved or cleared by the [place]  FDA and has been authorized for detection and/or diagnosis of SARS-CoV-2 by FDA under an Emergency Use Authorization (EUA). This EUA will remain in effect (meaning this test can be used) for the duration of the COVID-19 declaration under Section 564(b)(1) of the Act, 21 U.S.C. section 360bbb-3(b)(1), unless the authorization is terminated or revoked.     Resp Syncytial Virus by PCR NEGATIVE NEGATIVE Final    Comment: (NOTE) Fact Sheet for Patients: bloggercourse.com  Fact Sheet for Healthcare Providers: seriousbroker.it  This test is not yet approved or cleared by the [place]  FDA and has been authorized for detection and/or diagnosis of SARS-CoV-2 by FDA under an Emergency Use Authorization (EUA). This EUA will remain in effect (meaning this test can be used) for the duration of the COVID-19 declaration under Section 564(b)(1) of the Act, 21  U.S.C. section 360bbb-3(b)(1), unless the authorization is terminated or revoked.  Performed at [facility], [address], [place], [place] [address]   Blood culture (routine x 2)     Status: None (Preliminary result)   Collection Time: 08/27/24  7:50 PM   Specimen: BLOOD RIGHT HAND  Result Value Ref Range Status   Specimen Description   Final  BLOOD RIGHT HAND Performed at [facility], [address], [place], [place] [address]    Special Requests   Final    Blood Culture adequate volume Performed at [facility], [address] [place], [place] [address]    Culture   Final    NO GROWTH 4 DAYS Performed at [facility], [address], [place], [place] [address]    Report Status PENDING  Incomplete    Radiology Report US  EKG SITE RITE Result Date: 08/30/2024 If Site Rite image not attached, placement could not be confirmed due to current cardiac rhythm.  MR TOES RIGHT WO CONTRAST Result Date: 08/30/2024 CLINICAL DATA:  Chronic ulcer over the calcaneus. Gangrenous second and third toes. EXAM: MRI OF THE RIGHT TOES WITHOUT CONTRAST MR OF THE LEFT HEEL WITHOUT CONTRAST TECHNIQUE: Multiplanar, multisequence MR imaging of the right forefoot was performed. No intravenous contrast was administered. Multiplanar, multisequence MR imaging of the right ankle was performed. No intravenous contrast was administered. COMPARISON:  None Available. FINDINGS: TENDONS Peroneal: Peroneal longus tendon intact. Peroneal brevis intact. Posteromedial: Posterior tibial tendon intact. Flexor hallucis longus tendon intact. Flexor digitorum longus tendon intact. Anterior: Tibialis anterior tendon intact. Extensor hallucis longus tendon intact Extensor digitorum longus tendon intact. Achilles: Mild distal Achilles tendinosis. Moderate plantar fasciitis of the medial band of plantar fascia and towards the calcaneal insertion. Small plantar calcaneal  enthesophyte. Plantar Fascia: Intact. LIGAMENTS Lateral: Anterior talofibular ligament intact. Calcaneofibular ligament intact. Posterior talofibular ligament intact. Anterior and posterior tibiofibular ligaments intact. Medial: Deltoid ligament intact. Spring ligament intact. Lisfranc: Intact CARTILAGE Ankle Joint: No joint effusion. Normal ankle mortise. No chondral defect. Subtalar Joints/Sinus Tarsi: Normal subtalar joints. No subtalar joint effusion. Normal sinus tarsi. Bones: No aggressive osseous lesion. No fracture or dislocation. Bipartite medial hallux sesamoid. Mild arthritic changes of the medial and lateral hallux sesamoid-metatarsal articulation. Mild osteoarthritis of the first MTP joint. Soft Tissue: Soft tissue wound over the posterior calcaneus. No fluid collection or hematoma. Muscles are normal without edema or atrophy. Tarsal tunnel is normal. Generalized muscle atrophy. IMPRESSION: 1. Soft tissue wound over the posterior calcaneus. No osteomyelitis of the right forefoot or ankle. No drainable fluid collection to suggest an abscess. 2. Mild distal Achilles tendinosis. 3. Moderate plantar fasciitis of the medial band of plantar fascia and towards the calcaneal insertion. Electronically Signed   By: Julaine Blanch M.D.   On: 08/30/2024 17:08   MR HEEL LEFT WO CONTRAST Result Date: 08/30/2024 CLINICAL DATA:  Chronic ulcer over the calcaneus. Gangrenous second and third toes. EXAM: MRI OF THE RIGHT TOES WITHOUT CONTRAST MR OF THE LEFT HEEL WITHOUT CONTRAST TECHNIQUE: Multiplanar, multisequence MR imaging of the right forefoot was performed. No intravenous contrast was administered. Multiplanar, multisequence MR imaging of the right ankle was performed. No intravenous contrast was administered. COMPARISON:  None Available. FINDINGS: TENDONS Peroneal: Peroneal longus tendon intact. Peroneal brevis intact. Posteromedial: Posterior tibial tendon intact. Flexor hallucis longus tendon intact. Flexor  digitorum longus tendon intact. Anterior: Tibialis anterior tendon intact. Extensor hallucis longus tendon intact Extensor digitorum longus tendon intact. Achilles: Mild distal Achilles tendinosis. Moderate plantar fasciitis of the medial band of plantar fascia and towards the calcaneal insertion. Small plantar calcaneal enthesophyte. Plantar Fascia: Intact. LIGAMENTS Lateral: Anterior talofibular ligament intact. Calcaneofibular ligament intact. Posterior talofibular ligament intact. Anterior and posterior tibiofibular ligaments intact. Medial: Deltoid ligament intact. Spring ligament intact. Lisfranc: Intact CARTILAGE Ankle Joint: No joint effusion. Normal ankle mortise. No chondral defect. Subtalar  Joints/Sinus Tarsi: Normal subtalar joints. No subtalar joint effusion. Normal sinus tarsi. Bones: No aggressive osseous lesion. No fracture or dislocation. Bipartite medial hallux sesamoid. Mild arthritic changes of the medial and lateral hallux sesamoid-metatarsal articulation. Mild osteoarthritis of the first MTP joint. Soft Tissue: Soft tissue wound over the posterior calcaneus. No fluid collection or hematoma. Muscles are normal without edema or atrophy. Tarsal tunnel is normal. Generalized muscle atrophy. IMPRESSION: 1. Soft tissue wound over the posterior calcaneus. No osteomyelitis of the right forefoot or ankle. No drainable fluid collection to suggest an abscess. 2. Mild distal Achilles tendinosis. 3. Moderate plantar fasciitis of the medial band of plantar fascia and towards the calcaneal insertion. Electronically Signed   By: Julaine Blanch M.D.   On: 08/30/2024 17:08   VAS US  LOWER EXTREMITY ARTERIAL DUPLEX Result Date: 08/30/2024 LOWER EXTREMITY ARTERIAL DUPLEX STUDY Patient Name:  Melissa Howe  Date of Exam:   08/30/2024 Medical Rec #: [id number]          Accession #:    [id number] Date of Birth: [birth year]           Patient Gender: F Patient Age:   33 years Exam Location:  [facility]  Procedure:      VAS US  LOWER EXTREMITY ARTERIAL DUPLEX Referring Phys: LUCIE RHYNE --------------------------------------------------------------------------------  Indications: Peripheral artery disease, and Bilateral lower ext worsening tissue              loss and bilateral feet wounds. High Risk Factors: Hypertension, hyperlipidemia, Diabetes, past history of                    smoking, coronary artery disease.  Vascular Interventions: Occluded left arm AVG.                         11/30/23 Left anterior tibial artery stenting.                         06/06/24 - Lower ext arteriogram with known left PTA and                         PERO occlusion. Current ABI:            Bilateral non compressible vessels. Rt TBI - 0.32, Lt                         TBI 0.37 Performing Technologist: Ricka Sturdivant-Jones RDMS, RVT  Examination Guidelines: A complete evaluation includes B-mode imaging, spectral Doppler, color Doppler, and power Doppler as needed of all accessible portions of each vessel. Bilateral testing is considered an integral part of a complete examination. Limited examinations for reoccurring indications may be performed as noted.  +-----------+--------+-----+--------+----------+--------+ RIGHT      PSV cm/sRatioStenosisWaveform  Comments +-----------+--------+-----+--------+----------+--------+ CFA Mid    69                   monophasic         +-----------+--------+-----+--------+----------+--------+ CFA Distal 58                   monophasic         +-----------+--------+-----+--------+----------+--------+ DFA        57                   monophasic         +-----------+--------+-----+--------+----------+--------+ SFA  Prox   62                   monophasic         +-----------+--------+-----+--------+----------+--------+ SFA Mid    50                   monophasic         +-----------+--------+-----+--------+----------+--------+ SFA Distal 50                    monophasic         +-----------+--------+-----+--------+----------+--------+ POP Prox   83                                      +-----------+--------+-----+--------+----------+--------+ POP Distal 127                  biphasic           +-----------+--------+-----+--------+----------+--------+ ATA Prox   127                  monophasic         +-----------+--------+-----+--------+----------+--------+ ATA Mid    58                   monophasic         +-----------+--------+-----+--------+----------+--------+ ATA Distal 71                   monophasic         +-----------+--------+-----+--------+----------+--------+ PTA Prox   80                   monophasic         +-----------+--------+-----+--------+----------+--------+ PTA Mid    79                   monophasic         +-----------+--------+-----+--------+----------+--------+ PTA Distal              occluded                   +-----------+--------+-----+--------+----------+--------+ PERO Prox               occluded                   +-----------+--------+-----+--------+----------+--------+ PERO Mid                occluded                   +-----------+--------+-----+--------+----------+--------+ PERO Distal             occluded                   +-----------+--------+-----+--------+----------+--------+ DP         73                   monophasic         +-----------+--------+-----+--------+----------+--------+  +-----------+--------+-----+--------+----------+--------+ LEFT       PSV cm/sRatioStenosisWaveform  Comments +-----------+--------+-----+--------+----------+--------+ CFA Prox                        monophasic         +-----------+--------+-----+--------+----------+--------+ CFA Mid    66                                      +-----------+--------+-----+--------+----------+--------+  DFA        87                   monophasic          +-----------+--------+-----+--------+----------+--------+ SFA Prox   48                   monophasic         +-----------+--------+-----+--------+----------+--------+ SFA Mid    60                   monophasic         +-----------+--------+-----+--------+----------+--------+ SFA Distal 64                   monophasic         +-----------+--------+-----+--------+----------+--------+ POP Prox   63                   biphasic           +-----------+--------+-----+--------+----------+--------+ POP Distal 74                                      +-----------+--------+-----+--------+----------+--------+ ATA Prox                                  STENT    +-----------+--------+-----+--------+----------+--------+ ATA Mid                                   STENT    +-----------+--------+-----+--------+----------+--------+ ATA Distal                                STENT    +-----------+--------+-----+--------+----------+--------+ PTA Prox   105                  monophasic         +-----------+--------+-----+--------+----------+--------+ PTA Mid    74                   monophasic         +-----------+--------+-----+--------+----------+--------+ PTA Distal              occluded                   +-----------+--------+-----+--------+----------+--------+ PERO Prox               occluded                   +-----------+--------+-----+--------+----------+--------+ PERO Mid                occluded                   +-----------+--------+-----+--------+----------+--------+ PERO Distal             occluded                   +-----------+--------+-----+--------+----------+--------+ DP         45                                      +-----------+--------+-----+--------+----------+--------+  Left Stent(s): +---------------+--++----------++ Prox to Stent  +---------------+--++----------++ Proximal Stent  +---------------+--++----------++  Mid Stent      +---------------+--++----------++ Distal Stent   +---------------+--++----------++ Distal to Stent70monophasic +---------------+--++----------++    Summary: Right: 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 Patent left anterior tibial artery stent.  See table(s) above for measurements and observations. Electronically signed by Lonni Gaskins MD on 08/30/2024 at 1:46:44 PM.    Final    VAS US  ABI WITH/WO TBI Result Date: 08/30/2024  LOWER EXTREMITY DOPPLER STUDY Patient Name:  Melissa Howe  Date of Exam:   08/30/2024 Medical Rec #: [id number]          Accession #:    [id number] Date of Birth: 1951/03/12           Patient Gender: F Patient Age:   30 years Exam Location:  [facility] Procedure:      VAS US  ABI WITH/WO TBI Referring Phys: LUCIE RHYNE --------------------------------------------------------------------------------  Indications: Gangrene. Ischemia, critical limb. Bilateral foot wounds High Risk Factors: Hypertension, hyperlipidemia, past history of smoking,                    coronary artery disease.  Vascular Interventions: 11/30/2023 - Left anterial tibial stent.                         known occluded left arm AVG. Performing Technologist: Ricka Sturdivant-Jones RDMS, RVT  Examination Guidelines: A complete evaluation includes at minimum, Doppler waveform signals and systolic blood pressure reading at the level of bilateral brachial, anterior tibial, and posterior tibial arteries, when vessel segments are accessible. Bilateral testing is considered an integral part of a complete examination. Photoelectric Plethysmograph (PPG) waveforms and toe systolic pressure readings are included as required and additional duplex testing as needed.  Limited examinations for reoccurring indications may be performed as noted.  ABI Findings: +---------+------------------+-----+----------+----------------+ Right    Rt Pressure (mmHg)IndexWaveform  Comment          +---------+------------------+-----+----------+----------------+ Brachial 123                    triphasic                  +---------+------------------+-----+----------+----------------+ ATA      255               2.07 monophasicnon compressible +---------+------------------+-----+----------+----------------+ Great Toe39                0.32 Abnormal                   +---------+------------------+-----+----------+----------------+ +---------+------------------+-----+--------+----------------+ Left     Lt Pressure (mmHg)IndexWaveformComment          +---------+------------------+-----+--------+----------------+ Brachial 123                    biphasic                 +---------+------------------+-----+--------+----------------+ ATA      255               2.07 biphasicnon compressible +---------+------------------+-----+--------+----------------+ DP                                      non compressible +---------+------------------+-----+--------+----------------+ [place] Jules  0.37 Abnormal                 +---------+------------------+-----+--------+----------------+ +-------+-----------+-----------+------------+------------+ ABI/TBIToday's ABIToday's TBIPrevious ABIPrevious TBI +-------+-----------+-----------+------------+------------+ Right  [place]         0.32       [place]          0.40         +-------+-----------+-----------+------------+------------+ Left   [place]         0.37       [place]          0.34         +-------+-----------+-----------+------------+------------+  Summary: Right: Resting right ankle-brachial index indicates noncompressible right lower extremity arteries. The right toe-brachial index is abnormal.  Left:  Resting left ankle-brachial index indicates noncompressible left lower extremity arteries. The left toe-brachial index is abnormal.  *See table(s) above for measurements and observations.  Electronically signed by Lonni Gaskins MD on 08/30/2024 at 1:45:52 PM.    Final      Signature  -   Lavada Stank M.D on 08/31/2024 at 7:45 AM   -  To page go to [email or web address]

## 2024-08-31 NOTE — Progress Notes (Signed)
 Pt verbalized multiple times through the night that she is tired and wants to stop all of this. I explained to her that palliative and pastoral consults were in place and that overnight provider placed a note to notify case management and MD of patient's wishes.

## 2024-08-31 NOTE — Plan of Care (Signed)
   Problem: Education: Goal: Knowledge of General Education information will improve Description Including pain rating scale, medication(s)/side effects and non-pharmacologic comfort measures Outcome: Progressing

## 2024-08-31 NOTE — Progress Notes (Signed)
 [id number] [facility] Liaison Note  Received a request from Transitions of Care Manager for hospice services at home after discharge. Spoke with daughter, Camelia Sar to initiate education related to hospice philosophy, services and team approach to care. Crystal verbalized understanding of information given.  Per discussion, the plan is for discharge home by EMS/PTAR once bed is delivered.  DME needs discussed. Crystal requests hospital bed and over bed table. Address has been confirmed and is correct in the chart.  Camelia Sar [phone] is the family contact to arrange time of equipment delivery.  Please send signed and completed DNR home with the patient/family.  Please provide prescriptions at discharge as needed to ensure ongoing symptom management.  [facility] information and contact numbers given to [employer].  Above information shared with Marval, Transitions of Care Manager.  Please call with any questions or concerns.  Thank you for the opportunity to participate in this patient's care.  Inocente Jacobs, BSN, RN [employer] [phone]

## 2024-09-01 ENCOUNTER — Other Ambulatory Visit (HOSPITAL_COMMUNITY): Payer: Self-pay

## 2024-09-01 ENCOUNTER — Encounter (HOSPITAL_COMMUNITY): Payer: Self-pay

## 2024-09-01 DIAGNOSIS — E876 Hypokalemia: Secondary | ICD-10-CM | POA: Diagnosis not present

## 2024-09-01 LAB — CULTURE, BLOOD (ROUTINE X 2)
Culture: NO GROWTH
Culture: NO GROWTH
Special Requests: ADEQUATE
Special Requests: ADEQUATE

## 2024-09-01 MED ORDER — HYDROMORPHONE HCL 2 MG PO TABS
1.0000 mg | ORAL_TABLET | Freq: Three times a day (TID) | ORAL | 0 refills | Status: AC | PRN
  Filled 2024-09-01: qty 10, 7d supply, fill #0

## 2024-09-01 MED ORDER — METOCLOPRAMIDE HCL 5 MG PO TABS
5.0000 mg | ORAL_TABLET | Freq: Three times a day (TID) | ORAL | 0 refills | Status: AC | PRN
  Filled 2024-09-01: qty 20, 7d supply, fill #0

## 2024-09-01 MED ORDER — LORAZEPAM 1 MG PO TABS
1.0000 mg | ORAL_TABLET | Freq: Three times a day (TID) | ORAL | 0 refills | Status: AC | PRN
  Filled 2024-09-01: qty 10, 4d supply, fill #0

## 2024-09-01 MED ORDER — ONDANSETRON 4 MG PO TBDP
4.0000 mg | ORAL_TABLET | Freq: Three times a day (TID) | ORAL | 0 refills | Status: AC | PRN
  Filled 2024-09-01: qty 20, 7d supply, fill #0

## 2024-09-01 MED ORDER — ACETAMINOPHEN ER 650 MG PO TBCR
650.0000 mg | EXTENDED_RELEASE_TABLET | Freq: Four times a day (QID) | ORAL | 0 refills | Status: AC | PRN
  Filled 2024-09-01: qty 30, 8d supply, fill #0

## 2024-09-01 NOTE — Discharge Summary (Signed)
 Melissa Howe FMW:[id number] DOB: [birth year] DOA: 08/27/2024  PCP: Melissa Howe Melissa Howe., FNP  Admit date: 08/27/2024  Discharge date: 09/01/2024  Admitted From: Home   Disposition:  Home with Hospice   Recommendations for Outpatient Follow-up:   Follow up with PCP in 1-2 weeks  PCP Please obtain BMP/CBC, 2 view CXR in 1week,  (see Discharge instructions)   PCP Please follow up on the following pending results:     Disposition.  Home hospice Condition.  Guarded CODE STATUS.  DNR Activity.  With assistance as tolerated, full fall precautions. Diet.  Renal - Soft with feeding assistance and aspiration precautions. Goal of care.  Comfort.    Chief Complaint  Patient presents with   Emesis   Headache     Brief history of present illness from the day of admission and additional interim summary    73 y.o. female with medical history significant of end-stage renal disease on peritoneal dialysis, diabetes, hypertension, peripheral vascular disease with chronic foot wounds.  She comes in to the [facility] complaining of recurrent nausea, vomiting, diarrhea over the last 2 days.  Her vomitus was nonbloody, nonbilious.  Patient denies fevers chills, abdominal pain. Patient has chronic wounds on her feet and she is followed by podiatry for this -Last seen on 10/28 with diagnosis of Full-thickness Wagner grade 2 ulceration posterior aspect heel left -somewhat larger.  At that time she was debrided, dressed with Silvadene cream and given a surgical shoe.  I also referred her to wound care clinic   Patient also follows with Dr. Sheree for peripheral vascular disease.  She had a angiogram on 8/11.  The findings of Aorta and iliac segments are calcified however there is no flow-limiting stenosis.     In the ER she was  given antibiotics including Vanco and cefepime  for possible SBP.  CT head was done without abnormalities.  CT abdomen pelvis also done showing ascites, laboratory data was abnormal in regards to potassium, magnesium  and lactic acid                                                                   Hospital Course    N/V/D-leading to electrolyte abnormalities  - To have been gastroenteritis, abdominal exam benign, CT in the setting of PD catheter unremarkable, she did get dehydrated, this was treated with supportive care, she was seen by nephrology underwent PD treatments as well.  We were patient eventually decided that she wanted to stop all aggressive treatment and pursue home hospice, she was seen by palliative care, nephrology and me, according to the patient she has talked about this with her family multiple times before, after family discussion between palliative care patient and family it was decided that she will be best served with home hospice,  all aggressive treatment including PD will be stopped.  Goal of care will be comfort.  She is being discharged with home hospice.   Multiple areas of wounds in both feet present on admission follows with podiatry outpatient. - These wounds were chronic she was seen by wound care, podiatry and vascular surgery, she now does not want any aggressive treatment and wants home hospice.   Hypokalemia/Hypomagnesemia  - Replaced   Chronic hypotension.  Continue midodrine    Chronic leukocytosis -no acute issues.   Elevated lactic acid  - Due to dehydration no clinical signs of sepsis   ESRD on PD  - Nephrology was consultated   Right hip pain at the time of admission  - Stable x-ray, currently pain-free will monitor   DM2  -comfort medications only Discharge diagnosis     Principal Problem:   Hypokalemia Active Problems:   Pressure injury of left heel, stage 2 (HCC)   Gangrene of toe of right foot [facility])    Discharge instructions     Discharge Instructions     Discharge instructions   Complete by: As directed    Disposition.  Home hospice Condition.  Guarded CODE STATUS.  DNR Activity.  With assistance as tolerated, full fall precautions. Diet.  Renal - Soft with feeding assistance and aspiration precautions. Goal of care.  Comfort.   No wound care   Complete by: As directed        Discharge Medications   Allergies as of 09/01/2024   No Known Allergies      Medication List     STOP taking these medications    aspirin  EC 81 MG tablet   atorvastatin  80 MG tablet Commonly known as: LIPITOR    calcitRIOL  0.25 MCG capsule Commonly known as: ROCALTROL    cinacalcet  30 MG tablet Commonly known as: SENSIPAR    clopidogrel  75 MG tablet Commonly known as: Plavix    linagliptin  5 MG Tabs tablet Commonly known as: TRADJENTA    midodrine  10 MG tablet Commonly known as: PROAMATINE    potassium chloride  SA 20 MEQ tablet Commonly known as: KLOR-CON  M       TAKE these medications    acetaminophen  650 MG CR tablet Commonly known as: Tylenol  8 Hour Take 1 tablet (650 mg total) by mouth every 6 (six) hours as needed for pain or fever.   gabapentin  100 MG capsule Commonly known as: NEURONTIN  Take 100 mg by mouth in the morning and at bedtime.   HYDROmorphone  2 MG tablet Commonly known as: Dilaudid  Take 0.5 tablets (1 mg total) by mouth every 8 (eight) hours as needed for severe pain (pain score 7-10).   Insulin  Glargine Solostar 100 UNIT/ML Solostar Pen Commonly known as: LANTUS  Inject 20 Units into the skin daily.   LORazepam 1 MG tablet Commonly known as: Ativan Take 1 tablet (1 mg total) by mouth every 8 (eight) hours as needed for anxiety or seizure.   metoCLOPramide  5 MG tablet Commonly known as: REGLAN  Take 1 tablet (5 mg total) by mouth every 8 (eight) hours as needed for refractory nausea / vomiting. Take 1 tablet (5mg ) by mouth every Tuesday, Thursday and Saturday before  dialysis. What changed:  when to take this reasons to take this   ondansetron  4 MG disintegrating tablet Commonly known as: ZOFRAN -ODT Take 1 tablet (4 mg total) by mouth every 8 (eight) hours as needed for nausea or vomiting.   Santyl  250 UNIT/GM ointment Generic drug: collagenase  Apply 1-2 grams to wound qd   triamcinolone  lotion  0.1 % Commonly known as: KENALOG  Apply 1 Application topically 2 (two) times daily as needed (irritation).         Follow-up Information     [facility]  Follow up.   Why: Someone will call you to schedule first home visit. Contact information: [address] [place] [address] [phone]                 Major procedures and Radiology Reports - PLEASE review detailed and final reports thoroughly  -       US  EKG SITE RITE Result Date: 08/30/2024 If Site Rite image not attached, placement could not be confirmed due to current cardiac rhythm.  MR TOES RIGHT WO CONTRAST Result Date: 08/30/2024 CLINICAL DATA:  Chronic ulcer over the calcaneus. Gangrenous second and third toes. EXAM: MRI OF THE RIGHT TOES WITHOUT CONTRAST MR OF THE LEFT HEEL WITHOUT CONTRAST TECHNIQUE: Multiplanar, multisequence MR imaging of the right forefoot was performed. No intravenous contrast was administered. Multiplanar, multisequence MR imaging of the right ankle was performed. No intravenous contrast was administered. COMPARISON:  None Available. FINDINGS: TENDONS Peroneal: Peroneal longus tendon intact. Peroneal brevis intact. Posteromedial: Posterior tibial tendon intact. Flexor hallucis longus tendon intact. Flexor digitorum longus tendon intact. Anterior: Tibialis anterior tendon intact. Extensor hallucis longus tendon intact Extensor digitorum longus tendon intact. Achilles: Mild distal Achilles tendinosis. Moderate plantar fasciitis of the medial band of plantar fascia and towards the calcaneal insertion. Small plantar calcaneal  enthesophyte. Plantar Fascia: Intact. LIGAMENTS Lateral: Anterior talofibular ligament intact. Calcaneofibular ligament intact. Posterior talofibular ligament intact. Anterior and posterior tibiofibular ligaments intact. Medial: Deltoid ligament intact. Spring ligament intact. Lisfranc: Intact CARTILAGE Ankle Joint: No joint effusion. Normal ankle mortise. No chondral defect. Subtalar Joints/Sinus Tarsi: Normal subtalar joints. No subtalar joint effusion. Normal sinus tarsi. Bones: No aggressive osseous lesion. No fracture or dislocation. Bipartite medial hallux sesamoid. Mild arthritic changes of the medial and lateral hallux sesamoid-metatarsal articulation. Mild osteoarthritis of the first MTP joint. Soft Tissue: Soft tissue wound over the posterior calcaneus. No fluid collection or hematoma. Muscles are normal without edema or atrophy. Tarsal tunnel is normal. Generalized muscle atrophy. IMPRESSION: 1. Soft tissue wound over the posterior calcaneus. No osteomyelitis of the right forefoot or ankle. No drainable fluid collection to suggest an abscess. 2. Mild distal Achilles tendinosis. 3. Moderate plantar fasciitis of the medial band of plantar fascia and towards the calcaneal insertion. Electronically Signed   By: Julaine Blanch M.D.   On: 08/30/2024 17:08   MR HEEL LEFT WO CONTRAST Result Date: 08/30/2024 CLINICAL DATA:  Chronic ulcer over the calcaneus. Gangrenous second and third toes. EXAM: MRI OF THE RIGHT TOES WITHOUT CONTRAST MR OF THE LEFT HEEL WITHOUT CONTRAST TECHNIQUE: Multiplanar, multisequence MR imaging of the right forefoot was performed. No intravenous contrast was administered. Multiplanar, multisequence MR imaging of the right ankle was performed. No intravenous contrast was administered. COMPARISON:  None Available. FINDINGS: TENDONS Peroneal: Peroneal longus tendon intact. Peroneal brevis intact. Posteromedial: Posterior tibial tendon intact. Flexor hallucis longus tendon intact. Flexor  digitorum longus tendon intact. Anterior: Tibialis anterior tendon intact. Extensor hallucis longus tendon intact Extensor digitorum longus tendon intact. Achilles: Mild distal Achilles tendinosis. Moderate plantar fasciitis of the medial band of plantar fascia and towards the calcaneal insertion. Small plantar calcaneal enthesophyte. Plantar Fascia: Intact. LIGAMENTS Lateral: Anterior talofibular ligament intact. Calcaneofibular ligament intact. Posterior talofibular ligament intact. Anterior and posterior tibiofibular ligaments intact. Medial: Deltoid ligament intact. Spring ligament intact.  Lisfranc: Intact CARTILAGE Ankle Joint: No joint effusion. Normal ankle mortise. No chondral defect. Subtalar Joints/Sinus Tarsi: Normal subtalar joints. No subtalar joint effusion. Normal sinus tarsi. Bones: No aggressive osseous lesion. No fracture or dislocation. Bipartite medial hallux sesamoid. Mild arthritic changes of the medial and lateral hallux sesamoid-metatarsal articulation. Mild osteoarthritis of the first MTP joint. Soft Tissue: Soft tissue wound over the posterior calcaneus. No fluid collection or hematoma. Muscles are normal without edema or atrophy. Tarsal tunnel is normal. Generalized muscle atrophy. IMPRESSION: 1. Soft tissue wound over the posterior calcaneus. No osteomyelitis of the right forefoot or ankle. No drainable fluid collection to suggest an abscess. 2. Mild distal Achilles tendinosis. 3. Moderate plantar fasciitis of the medial band of plantar fascia and towards the calcaneal insertion. Electronically Signed   By: Julaine Blanch M.D.   On: 08/30/2024 17:08   VAS US  LOWER EXTREMITY ARTERIAL DUPLEX Result Date: 08/30/2024 LOWER EXTREMITY ARTERIAL DUPLEX STUDY Patient Name:  Melissa Howe  Date of Exam:   08/30/2024 Medical Rec #: [id number]          Accession #:    [id number] Date of Birth: July 25, 1951           Patient Gender: F Patient Age:   2 years Exam Location:  [facility]  Procedure:      VAS US  LOWER EXTREMITY ARTERIAL DUPLEX Referring Phys: LUCIE RHYNE --------------------------------------------------------------------------------  Indications: Peripheral artery disease, and Bilateral lower ext worsening tissue              loss and bilateral feet wounds. High Risk Factors: Hypertension, hyperlipidemia, Diabetes, past history of                    smoking, coronary artery disease.  Vascular Interventions: Occluded left arm AVG.                         11/30/23 Left anterior tibial artery stenting.                         06/06/24 - Lower ext arteriogram with known left PTA and                         PERO occlusion. Current ABI:            Bilateral non compressible vessels. Rt TBI - 0.32, Lt                         TBI 0.37 Performing Technologist: Ricka Sturdivant-Jones RDMS, RVT  Examination Guidelines: A complete evaluation includes B-mode imaging, spectral Doppler, color Doppler, and power Doppler as needed of all accessible portions of each vessel. Bilateral testing is considered an integral part of a complete examination. Limited examinations for reoccurring indications may be performed as noted.  +-----------+--------+-----+--------+----------+--------+ RIGHT      PSV cm/sRatioStenosisWaveform  Comments +-----------+--------+-----+--------+----------+--------+ CFA Mid    69                   monophasic         +-----------+--------+-----+--------+----------+--------+ CFA Distal 58                   monophasic         +-----------+--------+-----+--------+----------+--------+ DFA        57  monophasic         +-----------+--------+-----+--------+----------+--------+ SFA Prox   62                   monophasic         +-----------+--------+-----+--------+----------+--------+ SFA Mid    50                   monophasic         +-----------+--------+-----+--------+----------+--------+ SFA Distal 50                    monophasic         +-----------+--------+-----+--------+----------+--------+ POP Prox   83                                      +-----------+--------+-----+--------+----------+--------+ POP Distal 127                  biphasic           +-----------+--------+-----+--------+----------+--------+ ATA Prox   127                  monophasic         +-----------+--------+-----+--------+----------+--------+ ATA Mid    58                   monophasic         +-----------+--------+-----+--------+----------+--------+ ATA Distal 71                   monophasic         +-----------+--------+-----+--------+----------+--------+ PTA Prox   80                   monophasic         +-----------+--------+-----+--------+----------+--------+ PTA Mid    79                   monophasic         +-----------+--------+-----+--------+----------+--------+ PTA Distal              occluded                   +-----------+--------+-----+--------+----------+--------+ PERO Prox               occluded                   +-----------+--------+-----+--------+----------+--------+ PERO Mid                occluded                   +-----------+--------+-----+--------+----------+--------+ PERO Distal             occluded                   +-----------+--------+-----+--------+----------+--------+ DP         73                   monophasic         +-----------+--------+-----+--------+----------+--------+  +-----------+--------+-----+--------+----------+--------+ LEFT       PSV cm/sRatioStenosisWaveform  Comments +-----------+--------+-----+--------+----------+--------+ CFA Prox                        monophasic         +-----------+--------+-----+--------+----------+--------+ CFA Mid    66                                      +-----------+--------+-----+--------+----------+--------+  DFA        87                   monophasic          +-----------+--------+-----+--------+----------+--------+ SFA Prox   48                   monophasic         +-----------+--------+-----+--------+----------+--------+ SFA Mid    60                   monophasic         +-----------+--------+-----+--------+----------+--------+ SFA Distal 64                   monophasic         +-----------+--------+-----+--------+----------+--------+ POP Prox   63                   biphasic           +-----------+--------+-----+--------+----------+--------+ POP Distal 74                                      +-----------+--------+-----+--------+----------+--------+ ATA Prox                                  STENT    +-----------+--------+-----+--------+----------+--------+ ATA Mid                                   STENT    +-----------+--------+-----+--------+----------+--------+ ATA Distal                                STENT    +-----------+--------+-----+--------+----------+--------+ PTA Prox   105                  monophasic         +-----------+--------+-----+--------+----------+--------+ PTA Mid    74                   monophasic         +-----------+--------+-----+--------+----------+--------+ PTA Distal              occluded                   +-----------+--------+-----+--------+----------+--------+ PERO Prox               occluded                   +-----------+--------+-----+--------+----------+--------+ PERO Mid                occluded                   +-----------+--------+-----+--------+----------+--------+ PERO Distal             occluded                   +-----------+--------+-----+--------+----------+--------+ DP         45                                      +-----------+--------+-----+--------+----------+--------+  Left Stent(s): +---------------+--++----------++ Prox to Stent  +---------------+--++----------++ Proximal Stent  +---------------+--++----------++  Mid Stent      +---------------+--++----------++ Distal Stent   +---------------+--++----------++ Distal to Stent21monophasic +---------------+--++----------++    Summary: Right: 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 Patent left anterior tibial artery stent.  See table(s) above for measurements and observations. Electronically signed by Lonni Gaskins MD on 08/30/2024 at 1:46:44 PM.    Final    VAS US  ABI WITH/WO TBI Result Date: 08/30/2024  LOWER EXTREMITY DOPPLER STUDY Patient Name:  Melissa Howe  Date of Exam:   08/30/2024 Medical Rec #: [id number]          Accession #:    [id number] Date of Birth: 1951/02/21           Patient Gender: F Patient Age:   58 years Exam Location:  [facility] Procedure:      VAS US  ABI WITH/WO TBI Referring Phys: LUCIE RHYNE --------------------------------------------------------------------------------  Indications: Gangrene. Ischemia, critical limb. Bilateral foot wounds High Risk Factors: Hypertension, hyperlipidemia, past history of smoking,                    coronary artery disease.  Vascular Interventions: 11/30/2023 - Left anterial tibial stent.                         known occluded left arm AVG. Performing Technologist: Ricka Sturdivant-Jones RDMS, RVT  Examination Guidelines: A complete evaluation includes at minimum, Doppler waveform signals and systolic blood pressure reading at the level of bilateral brachial, anterior tibial, and posterior tibial arteries, when vessel segments are accessible. Bilateral testing is considered an integral part of a complete examination. Photoelectric Plethysmograph (PPG) waveforms and toe systolic pressure readings are included as required and additional duplex testing as needed.  Limited examinations for reoccurring indications may be performed as noted.  ABI Findings: +---------+------------------+-----+----------+----------------+ Right    Rt Pressure (mmHg)IndexWaveform  Comment          +---------+------------------+-----+----------+----------------+ Brachial 123                    triphasic                  +---------+------------------+-----+----------+----------------+ ATA      255               2.07 monophasicnon compressible +---------+------------------+-----+----------+----------------+ Great Toe39                0.32 Abnormal                   +---------+------------------+-----+----------+----------------+ +---------+------------------+-----+--------+----------------+ Left     Lt Pressure (mmHg)IndexWaveformComment          +---------+------------------+-----+--------+----------------+ Brachial 123                    biphasic                 +---------+------------------+-----+--------+----------------+ ATA      255               2.07 biphasicnon compressible +---------+------------------+-----+--------+----------------+ DP                                      non compressible +---------+------------------+-----+--------+----------------+ [place] Jules  0.37 Abnormal                 +---------+------------------+-----+--------+----------------+ +-------+-----------+-----------+------------+------------+ ABI/TBIToday's ABIToday's TBIPrevious ABIPrevious TBI +-------+-----------+-----------+------------+------------+ Right  [place]         0.32       [place]          0.40         +-------+-----------+-----------+------------+------------+ Left   [place]         0.37       [place]          0.34         +-------+-----------+-----------+------------+------------+  Summary: Right: Resting right ankle-brachial index indicates noncompressible right lower extremity arteries. The right toe-brachial index is abnormal.  Left:  Resting left ankle-brachial index indicates noncompressible left lower extremity arteries. The left toe-brachial index is abnormal.  *See table(s) above for measurements and observations.  Electronically signed by Lonni Gaskins MD on 08/30/2024 at 1:45:52 PM.    Final    DG Foot 2 Views Right Result Date: 08/28/2024 CLINICAL DATA:  Osteomyelitis EXAM: RIGHT FOOT - 2 VIEW COMPARISON:  None Available. FINDINGS: There is no evidence of fracture or dislocation. Diffuse degenerative changes of the foot. Dorsal and plantar calcaneal spur. Vascular calcifications. No cortical erosions. Soft tissues are unremarkable. IMPRESSION: 1. No radiographic evidence of osteomyelitis. 2. Diffuse degenerative changes of the foot. Electronically Signed   By: Limin  Xu M.D.   On: 08/28/2024 17:46   DG HIP UNILAT WITH PELVIS 2-3 VIEWS RIGHT Result Date: 08/28/2024 CLINICAL DATA:  Right hip pain EXAM: DG HIP (WITH OR WITHOUT PELVIS) 3V RIGHT COMPARISON:  CT abdomen and pelvis dated 08/27/2024 FINDINGS: There is no evidence of hip fracture or dislocation. Mild degenerative changes of bilateral hips. Vascular calcifications. Partially imaged peritoneal catheter is coiled in the left hemipelvis. IMPRESSION: Mild degenerative changes of bilateral hips. No acute fracture or dislocation. Electronically Signed   By: Limin  Xu M.D.   On: 08/28/2024 17:39   CT ABDOMEN PELVIS WO CONTRAST Result Date: 08/27/2024 EXAM: CT ABDOMEN AND PELVIS WITHOUT CONTRAST 08/27/2024 07:36:00 PM TECHNIQUE: CT of the abdomen and pelvis was performed without the administration of intravenous contrast. Multiplanar reformatted images are provided for review. Automated exposure control, iterative reconstruction, and/or weight-based adjustment of the mA/kV was utilized to reduce the radiation dose to as low as reasonably achievable. COMPARISON: None available. CLINICAL HISTORY: Abdominal pain, acute, nonlocalized. FINDINGS: LOWER CHEST: No acute abnormality.  LIVER: The liver is unremarkable. GALLBLADDER AND BILE DUCTS: Gallbladder is unremarkable. No biliary ductal dilatation. SPLEEN: No acute abnormality. PANCREAS: No acute abnormality. ADRENAL GLANDS: No acute abnormality. KIDNEYS, URETERS AND BLADDER: No stones in the kidneys or ureters. No hydronephrosis. No perinephric or periureteral stranding. Urinary bladder is unremarkable. GI AND BOWEL: Stomach demonstrates no acute abnormality. There is no bowel obstruction. No visible abnormal bowel or inflammation. PERITONEUM AND RETROPERITONEUM: Moderate to large volume ascites throughout the abdomen and pelvis. Moderate pneumoperitoneum. Exact source is not clearly identified. This conceivably could be related to peritoneal dialysis. Peritoneal dialysis catheter in the pelvis. VASCULATURE: Aorta is normal in caliber. Diffuse aortoiliac and branch vessel atherosclerosis. LYMPH NODES: No lymphadenopathy. REPRODUCTIVE ORGANS: No acute abnormality. BONES AND SOFT TISSUES: No acute osseous abnormality. No focal soft tissue abnormality. IMPRESSION: 1. Moderate pneumoperitoneum with moderate to large ascites; source not identified. Consider peritoneal dialysis as a potential cause. Recommend clinical correlation to exclude bowel gas. No abnormal bowel visualized. 2. Peritoneal dialysis catheter present in the pelvis. 3. Extensive aortoiliac and  branch vessel atherosclerosis. Electronically signed by: Franky Crease MD 08/27/2024 07:50 PM EDT RP Workstation: [id number]   DG Chest Port 1 View Result Date: 08/27/2024 CLINICAL DATA:  Altered mental status. EXAM: PORTABLE CHEST 1 VIEW COMPARISON:  Feb 29, 2024 FINDINGS: The heart size and mediastinal contours are within normal limits. Low lung volumes are noted. No acute infiltrate, pleural effusion or pneumothorax is identified. Multilevel degenerative changes seen throughout the thoracic spine. IMPRESSION: Low lung volumes without acute or active cardiopulmonary disease.  Electronically Signed   By: Suzen Dials M.D.   On: 08/27/2024 19:38   CT HEAD WO CONTRAST Result Date: 08/27/2024 CLINICAL DATA:  Altered mental status. EXAM: CT HEAD WITHOUT CONTRAST TECHNIQUE: Contiguous axial images were obtained from the base of the skull through the vertex without intravenous contrast. RADIATION DOSE REDUCTION: This exam was performed according to the departmental dose-optimization program which includes automated exposure control, adjustment of the mA and/or kV according to patient size and/or use of iterative reconstruction technique. COMPARISON:  February 22, 2024 FINDINGS: Brain: There is generalized cerebral atrophy with widening of the extra-axial spaces and ventricular dilatation. There are areas of decreased attenuation within the white matter tracts of the supratentorial brain, consistent with microvascular disease changes. Vascular: No hyperdense vessel or unexpected calcification. Skull: Normal. Negative for fracture or focal lesion. Sinuses/Orbits: Postoperative changes are seen involving the upper lobe. Other: None. IMPRESSION: 1. Generalized cerebral atrophy and microvascular disease changes of the supratentorial brain. 2. No acute intracranial abnormality. Electronically Signed   By: Suzen Dials M.D.   On: 08/27/2024 19:36    Micro Results    Recent Results (from the past 240 hours)  Blood culture (routine x 2)     Status: None   Collection Time: 08/27/24  6:51 PM   Specimen: BLOOD  Result Value Ref Range Status   Specimen Description   Final    BLOOD RIGHT ANTECUBITAL Performed at [facility], [address] [place], [place] [address]    Special Requests   Final    Blood Culture adequate volume Performed at [facility], [address], [place], [place] [address]    Culture   Final    NO GROWTH 5 DAYS Performed at [facility], [address], [place], [place] [address]    Report Status 09/01/2024  FINAL  Final  Body fluid culture w Gram Stain     Status: None   Collection Time: 08/27/24  7:43 PM   Specimen: Peritoneal Cavity; Peritoneal Fluid  Result Value Ref Range Status   Specimen Description   Final    PERITONEAL CAVITY Performed at [facility], [address], [place], [place] [address]    Special Requests   Final    NONE Performed at [facility], [address], [place], [place] [address]    Gram Stain NO WBC SEEN NO ORGANISMS SEEN   Final   Culture   Final    NO GROWTH 3 DAYS Performed at [facility], [address], [place], [place] [address]    Report Status 08/31/2024 FINAL  Final  Resp panel by RT-PCR (RSV, Flu A&B, Covid) Peripheral     Status: None   Collection Time: 08/27/24  7:43 PM   Specimen: Peripheral; Nasal Swab  Result Value Ref Range Status   SARS Coronavirus 2 by RT PCR NEGATIVE NEGATIVE Final    Comment: (NOTE) SARS-CoV-2 target nucleic acids are NOT DETECTED.  The SARS-CoV-2 RNA is  generally detectable in upper respiratory specimens during the acute phase of infection. The lowest concentration of SARS-CoV-2 viral copies this assay can detect is 138 copies/mL. A negative result does not preclude SARS-Cov-2 infection and should not be used as the sole basis for treatment or other patient management decisions. A negative result may occur with  improper specimen collection/handling, submission of specimen other than nasopharyngeal swab, presence of viral mutation(s) within the areas targeted by this assay, and inadequate number of viral copies(<138 copies/mL). A negative result must be combined with clinical observations, patient history, and epidemiological information. The expected result is Negative.  Fact Sheet for Patients:  bloggercourse.com  Fact Sheet for Healthcare Providers:  seriousbroker.it  This test is no t yet approved or cleared  by the [place]  FDA and  has been authorized for detection and/or diagnosis of SARS-CoV-2 by FDA under an Emergency Use Authorization (EUA). This EUA will remain  in effect (meaning this test can be used) for the duration of the COVID-19 declaration under Section 564(b)(1) of the Act, 21 U.S.C.section 360bbb-3(b)(1), unless the authorization is terminated  or revoked sooner.       Influenza A by PCR NEGATIVE NEGATIVE Final   Influenza B by PCR NEGATIVE NEGATIVE Final    Comment: (NOTE) The Xpert Xpress SARS-CoV-2/FLU/RSV plus assay is intended as an aid in the diagnosis of influenza from Nasopharyngeal swab specimens and should not be used as a sole basis for treatment. Nasal washings and aspirates are unacceptable for Xpert Xpress SARS-CoV-2/FLU/RSV testing.  Fact Sheet for Patients: bloggercourse.com  Fact Sheet for Healthcare Providers: seriousbroker.it  This test is not yet approved or cleared by the [place]  FDA and has been authorized for detection and/or diagnosis of SARS-CoV-2 by FDA under an Emergency Use Authorization (EUA). This EUA will remain in effect (meaning this test can be used) for the duration of the COVID-19 declaration under Section 564(b)(1) of the Act, 21 U.S.C. section 360bbb-3(b)(1), unless the authorization is terminated or revoked.     Resp Syncytial Virus by PCR NEGATIVE NEGATIVE Final    Comment: (NOTE) Fact Sheet for Patients: bloggercourse.com  Fact Sheet for Healthcare Providers: seriousbroker.it  This test is not yet approved or cleared by the [place]  FDA and has been authorized for detection and/or diagnosis of SARS-CoV-2 by FDA under an Emergency Use Authorization (EUA). This EUA will remain in effect (meaning this test can be used) for the duration of the COVID-19 declaration under Section 564(b)(1) of the Act, 21  U.S.C. section 360bbb-3(b)(1), unless the authorization is terminated or revoked.  Performed at [facility], [address] [place], [place] [address]   Blood culture (routine x 2)     Status: None   Collection Time: 08/27/24  7:50 PM   Specimen: BLOOD RIGHT HAND  Result Value Ref Range Status   Specimen Description   Final    BLOOD RIGHT HAND Performed at [facility], [address] [place], [place] [address]    Special Requests   Final    Blood Culture adequate volume Performed at [facility], [address], [place], [place] [address]    Culture   Final    NO GROWTH 5 DAYS Performed at [facility], [address], [place], [place] [address]    Report Status 09/01/2024 FINAL  Final    Today   Subjective    Melissa Howe today has no headache,no chest abdominal pain,no new weakness tingling or numbness, feels  much better wants to go home today with Hospice - No PD.    Objective   Blood pressure 99/65, pulse 88, temperature 97.9 F (36.6 C), temperature source Oral, resp. rate 18, height 5' 2.5 (1.588 m), weight 75.3 kg, SpO2 98%.  No intake or output data in the 24 hours ending 09/01/24 0723  Exam  Awake Alert, No new F.N deficits,    [place].AT,PERRAL Supple Neck,   Symmetrical Chest wall movement, Good air movement bilaterally, CTAB RRR,No Gallops,   +ve B.Sounds, Abd Soft, Non tender,  Lateral lower extremity wound stable under bandage   Data Review   Recent Labs  Lab 08/27/24 1853 08/27/24 1908 08/29/24 0455 08/30/24 0419 08/31/24 0553  WBC 12.4*  --  13.9* 16.3* 21.4*  HGB 13.4 14.6 11.8* 10.8* 11.8*  HCT 40.8 43.0 35.7* 32.1* 32.5*  PLT 404*  --  338 343 663*  MCV 87.0  --  86.9 86.1 85.8  MCH 28.6  --  28.7 29.0 31.1  MCHC 32.8  --  33.1 33.6 36.3*  RDW 13.8  --  14.3 14.4 14.7  LYMPHSABS 2.2  --   --  3.5 2.4  MONOABS 0.8  --   --  1.3* 1.6*  EOSABS 0.2  --   --  0.4 0.1  BASOSABS 0.0   --   --  0.1 0.0    Recent Labs  Lab 08/27/24 1851 08/27/24 1853 08/27/24 1853 08/27/24 1908 08/27/24 2101 08/28/24 0029 08/28/24 0229 08/28/24 1556 08/29/24 0455 08/29/24 1127 08/30/24 0419 08/31/24 0553  NA  --  132*   < > 131*  --   --   --  133* 133*  --  132* 133*  K  --  2.3*   < > 2.1*  --   --   --  2.9* 4.4  --  3.0* 4.1  CL  --  90*  --   --   --   --   --  95* 97*  --  95* 96*  CO2  --  26  --   --   --   --   --  23 24  --  24 22  ANIONGAP  --  17*  --   --   --   --   --  15 12  --  13 15  GLUCOSE  --  253*  --   --   --   --   --  167* 90  --  80 127*  BUN  --  13  --   --   --   --   --  13 15  --  12 15  CREATININE  --  6.31*  --   --   --   --   --  6.21* 6.63*  --  6.20* 6.26*  AST  --  22  --   --   --   --   --   --  24  --   --   --   ALT  --  11  --   --   --   --   --   --  16  --   --   --   ALKPHOS  --  229*  --   --   --   --   --   --  138*  --   --   --   BILITOT  --  0.5  --   --   --   --   --   --  0.9  --   --   --   ALBUMIN   --  2.2*  --   --   --   --   --   --  <1.5*  --   --   --   CRP  --   --   --   --   --   --   --   --   --  0.9  --   --   LATICACIDVEN 5.6*  --   --   --  5.7* 5.0* 4.8* 4.7*  --   --   --   --   HGBA1C  --   --   --   --   --   --   --  5.7*  --   --   --   --   AMMONIA  --  13  --   --   --   --   --   --   --   --   --   --   MG  --   --   --   --  1.2*  --   --  1.3* 1.4*  --  2.0 1.8  PHOS  --   --   --   --   --   --   --   --   --   --   --  2.7  CALCIUM   --  9.2  --   --   --   --   --  8.3* 8.3*  --  8.6* 8.7*   < > = values in this interval not displayed.    Total Time in preparing paper work, data evaluation and todays exam - 35 minutes  Signature  -    Lavada Stank M.D on 09/01/2024 at 7:23 AM   -  To page go to [email or web address]

## 2024-09-01 NOTE — Progress Notes (Signed)
 D/C order noted. Contacted pt's PD RN at [facility] to confirm pt was d/c to home with hospice today with no more PD planned.   Randine Mungo Dialysis Navigator [phone]

## 2024-09-01 NOTE — Plan of Care (Signed)

## 2024-09-01 NOTE — Discharge Instructions (Addendum)
 Disposition.  Home hospice Condition.  Guarded CODE STATUS.  DNR Activity.  With assistance as tolerated, full fall precautions. Diet.  Renal - Soft with feeding assistance and aspiration precautions. Goal of care.  Comfort.

## 2024-09-06 ENCOUNTER — Ambulatory Visit: Admitting: Podiatry

## 2024-09-21 ENCOUNTER — Ambulatory Visit (HOSPITAL_COMMUNITY)

## 2024-09-26 DEATH — deceased
